# Patient Record
Sex: Male | Born: 1960 | Race: Black or African American | Hispanic: No
Health system: Southern US, Academic
[De-identification: ages and names within clinical notes are randomized; demographics above are authoritative.]

## PROBLEM LIST (undated history)

## (undated) ENCOUNTER — Ambulatory Visit: Payer: BLUE CROSS/BLUE SHIELD

## (undated) ENCOUNTER — Ambulatory Visit

## (undated) ENCOUNTER — Encounter

## (undated) ENCOUNTER — Telehealth

## (undated) ENCOUNTER — Telehealth: Attending: Urology | Primary: Urology

## (undated) ENCOUNTER — Ambulatory Visit
Payer: Medicare (Managed Care) | Attending: Student in an Organized Health Care Education/Training Program | Primary: Student in an Organized Health Care Education/Training Program

## (undated) ENCOUNTER — Encounter
Attending: Student in an Organized Health Care Education/Training Program | Primary: Student in an Organized Health Care Education/Training Program

## (undated) ENCOUNTER — Ambulatory Visit: Payer: Medicaid (Managed Care)

## (undated) ENCOUNTER — Ambulatory Visit: Payer: BLUE CROSS/BLUE SHIELD | Attending: Urology | Primary: Urology

## (undated) ENCOUNTER — Encounter: Attending: Urology | Primary: Urology

## (undated) ENCOUNTER — Encounter: Attending: Dermatology | Primary: Dermatology

## (undated) ENCOUNTER — Inpatient Hospital Stay

## (undated) DIAGNOSIS — K219 Gastro-esophageal reflux disease without esophagitis: Secondary | ICD-10-CM

## (undated) DIAGNOSIS — D689 Coagulation defect, unspecified: Secondary | ICD-10-CM

## (undated) DIAGNOSIS — J45909 Unspecified asthma, uncomplicated: Secondary | ICD-10-CM

## (undated) DIAGNOSIS — Z21 Asymptomatic human immunodeficiency virus [HIV] infection status: Secondary | ICD-10-CM

## (undated) DIAGNOSIS — Z5189 Encounter for other specified aftercare: Secondary | ICD-10-CM

## (undated) DIAGNOSIS — M1990 Unspecified osteoarthritis, unspecified site: Secondary | ICD-10-CM

## (undated) DIAGNOSIS — I10 Essential (primary) hypertension: Secondary | ICD-10-CM

## (undated) DIAGNOSIS — B20 Human immunodeficiency virus [HIV] disease: Secondary | ICD-10-CM

## (undated) DIAGNOSIS — I499 Cardiac arrhythmia, unspecified: Secondary | ICD-10-CM

## (undated) DIAGNOSIS — R0600 Dyspnea, unspecified: Secondary | ICD-10-CM

## (undated) DIAGNOSIS — I82409 Acute embolism and thrombosis of unspecified deep veins of unspecified lower extremity: Secondary | ICD-10-CM

## (undated) DIAGNOSIS — K8020 Calculus of gallbladder without cholecystitis without obstruction: Secondary | ICD-10-CM

## (undated) DIAGNOSIS — L0291 Cutaneous abscess, unspecified: Secondary | ICD-10-CM

## (undated) DIAGNOSIS — M87052 Idiopathic aseptic necrosis of left femur: Secondary | ICD-10-CM

## (undated) DIAGNOSIS — N183 Chronic kidney disease, stage 3 unspecified: Secondary | ICD-10-CM

## (undated) DIAGNOSIS — K5790 Diverticulosis of intestine, part unspecified, without perforation or abscess without bleeding: Secondary | ICD-10-CM

## (undated) HISTORY — DX: Chronic kidney disease, stage 3 (moderate): N18.3

## (undated) HISTORY — DX: Unspecified asthma, uncomplicated: J45.909

## (undated) HISTORY — PX: JOINT REPLACEMENT: SHX530

## (undated) HISTORY — DX: Chronic kidney disease, stage 3 unspecified: N18.30

## (undated) HISTORY — DX: Diverticulosis of intestine, part unspecified, without perforation or abscess without bleeding: K57.90

## (undated) HISTORY — DX: Essential (primary) hypertension: I10

## (undated) HISTORY — DX: Idiopathic aseptic necrosis of left femur: M87.052

## (undated) HISTORY — DX: Coagulation defect, unspecified: D68.9

## (undated) HISTORY — PX: DENTAL SURGERY: SHX609

## (undated) HISTORY — DX: Morbid (severe) obesity due to excess calories: E66.01

## (undated) HISTORY — DX: Calculus of gallbladder without cholecystitis without obstruction: K80.20

## (undated) HISTORY — DX: Encounter for other specified aftercare: Z51.89

---

## 2015-12-14 ENCOUNTER — Encounter (HOSPITAL_COMMUNITY): Payer: Self-pay | Admitting: *Deleted

## 2015-12-14 ENCOUNTER — Emergency Department (HOSPITAL_COMMUNITY)
Admission: EM | Admit: 2015-12-14 | Discharge: 2015-12-14 | Disposition: A | Discharge status: Home / Self Care | Payer: Medicaid - Out of State | Attending: Emergency Medicine | Admitting: Emergency Medicine

## 2015-12-14 ENCOUNTER — Emergency Department (HOSPITAL_COMMUNITY): Payer: Medicaid - Out of State

## 2015-12-14 DIAGNOSIS — R0602 Shortness of breath: Secondary | ICD-10-CM | POA: Insufficient documentation

## 2015-12-14 DIAGNOSIS — N492 Inflammatory disorders of scrotum: Secondary | ICD-10-CM | POA: Insufficient documentation

## 2015-12-14 DIAGNOSIS — L0291 Cutaneous abscess, unspecified: Secondary | ICD-10-CM

## 2015-12-14 DIAGNOSIS — N453 Epididymo-orchitis: Secondary | ICD-10-CM

## 2015-12-14 HISTORY — DX: Acute embolism and thrombosis of unspecified deep veins of unspecified lower extremity: I82.409

## 2015-12-14 HISTORY — DX: Asymptomatic human immunodeficiency virus (hiv) infection status: Z21

## 2015-12-14 HISTORY — DX: Cutaneous abscess, unspecified: L02.91

## 2015-12-14 HISTORY — DX: Human immunodeficiency virus (HIV) disease: B20

## 2015-12-14 LAB — CBC WITH DIFFERENTIAL/PLATELET
BASOS ABS: 0 10*3/uL (ref 0.0–0.1)
Basophils Relative: 0 %
EOS PCT: 2 %
Eosinophils Absolute: 0.2 10*3/uL (ref 0.0–0.7)
HCT: 37.4 % — ABNORMAL LOW (ref 39.0–52.0)
Hemoglobin: 11.9 g/dL — ABNORMAL LOW (ref 13.0–17.0)
LYMPHS ABS: 1.5 10*3/uL (ref 0.7–4.0)
LYMPHS PCT: 17 %
MCH: 33.1 pg (ref 26.0–34.0)
MCHC: 31.8 g/dL (ref 30.0–36.0)
MCV: 104.2 fL — AB (ref 78.0–100.0)
MONO ABS: 0.6 10*3/uL (ref 0.1–1.0)
MONOS PCT: 6 %
Neutro Abs: 6.8 10*3/uL (ref 1.7–7.7)
Neutrophils Relative %: 75 %
PLATELETS: 279 10*3/uL (ref 150–400)
RBC: 3.59 MIL/uL — ABNORMAL LOW (ref 4.22–5.81)
RDW: 14 % (ref 11.5–15.5)
WBC: 9.1 10*3/uL (ref 4.0–10.5)

## 2015-12-14 LAB — COMPREHENSIVE METABOLIC PANEL
ALBUMIN: 3 g/dL — AB (ref 3.5–5.0)
ALT: 11 U/L — ABNORMAL LOW (ref 17–63)
AST: 15 U/L (ref 15–41)
Alkaline Phosphatase: 62 U/L (ref 38–126)
Anion gap: 6 (ref 5–15)
BILIRUBIN TOTAL: 0.5 mg/dL (ref 0.3–1.2)
BUN: 11 mg/dL (ref 6–20)
CO2: 24 mmol/L (ref 22–32)
Calcium: 8.9 mg/dL (ref 8.9–10.3)
Chloride: 108 mmol/L (ref 101–111)
Creatinine, Ser: 1.7 mg/dL — ABNORMAL HIGH (ref 0.61–1.24)
GFR calc Af Amer: 50 mL/min — ABNORMAL LOW (ref >60)
GFR calc non Af Amer: 43 mL/min — ABNORMAL LOW (ref >60)
GLUCOSE: 112 mg/dL — AB (ref 65–99)
POTASSIUM: 4.9 mmol/L (ref 3.5–5.1)
Sodium: 138 mmol/L (ref 135–145)
TOTAL PROTEIN: 7.1 g/dL (ref 6.5–8.1)

## 2015-12-14 LAB — BRAIN NATRIURETIC PEPTIDE: B Natriuretic Peptide: 125.9 pg/mL — ABNORMAL HIGH (ref 0.0–100.0)

## 2015-12-14 IMAGING — DX DG CHEST 2V
2 series · 2 of 2 positions shown · non-contrast
Comparison: None.

CLINICAL DATA: Weakness, fatigue, shortness of breath with exertion
for the past 2 weeks. Bilateral lower extremity swelling for the
past 2 weeks.

EXAM:
CHEST  2 VIEW

[chest pa]
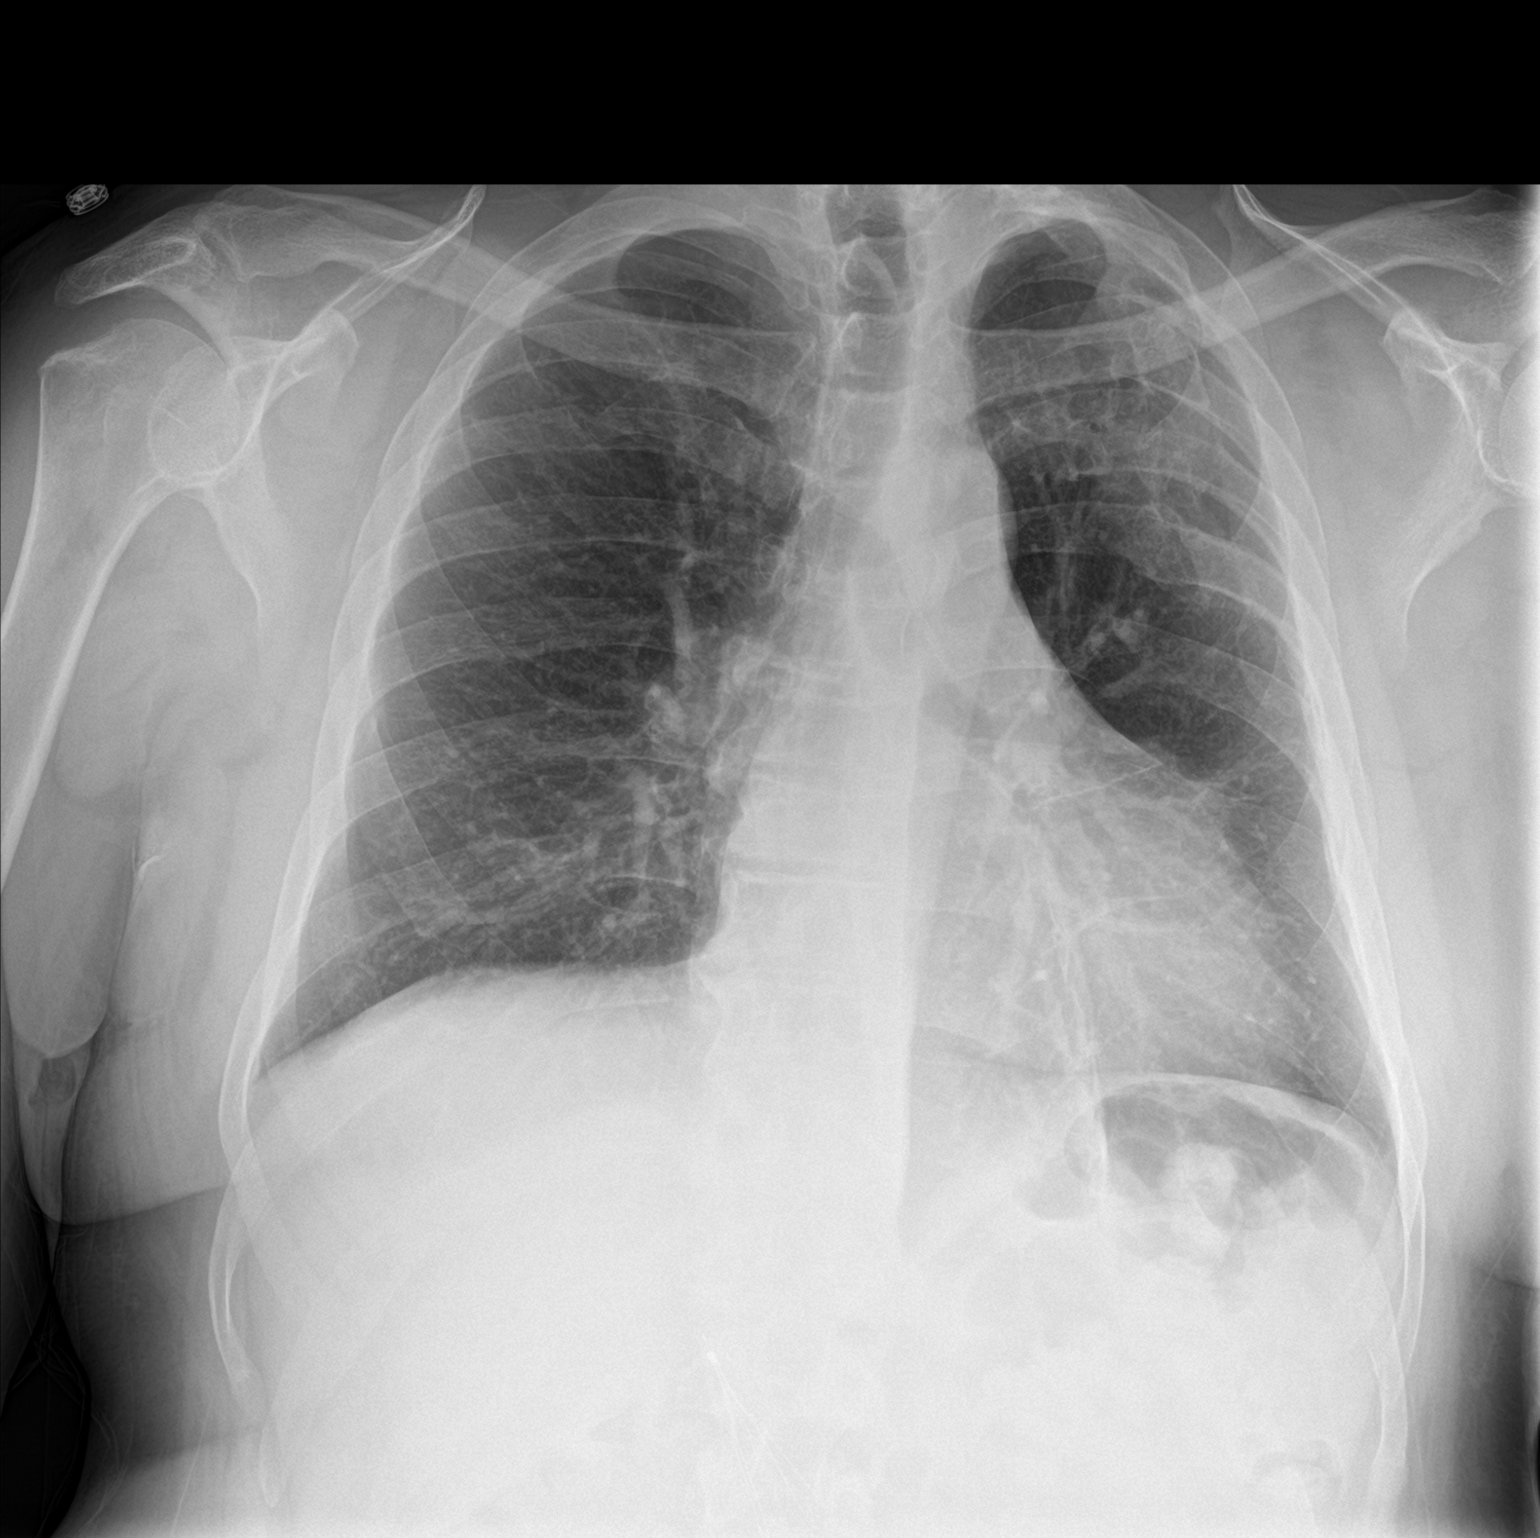

[chest lat]
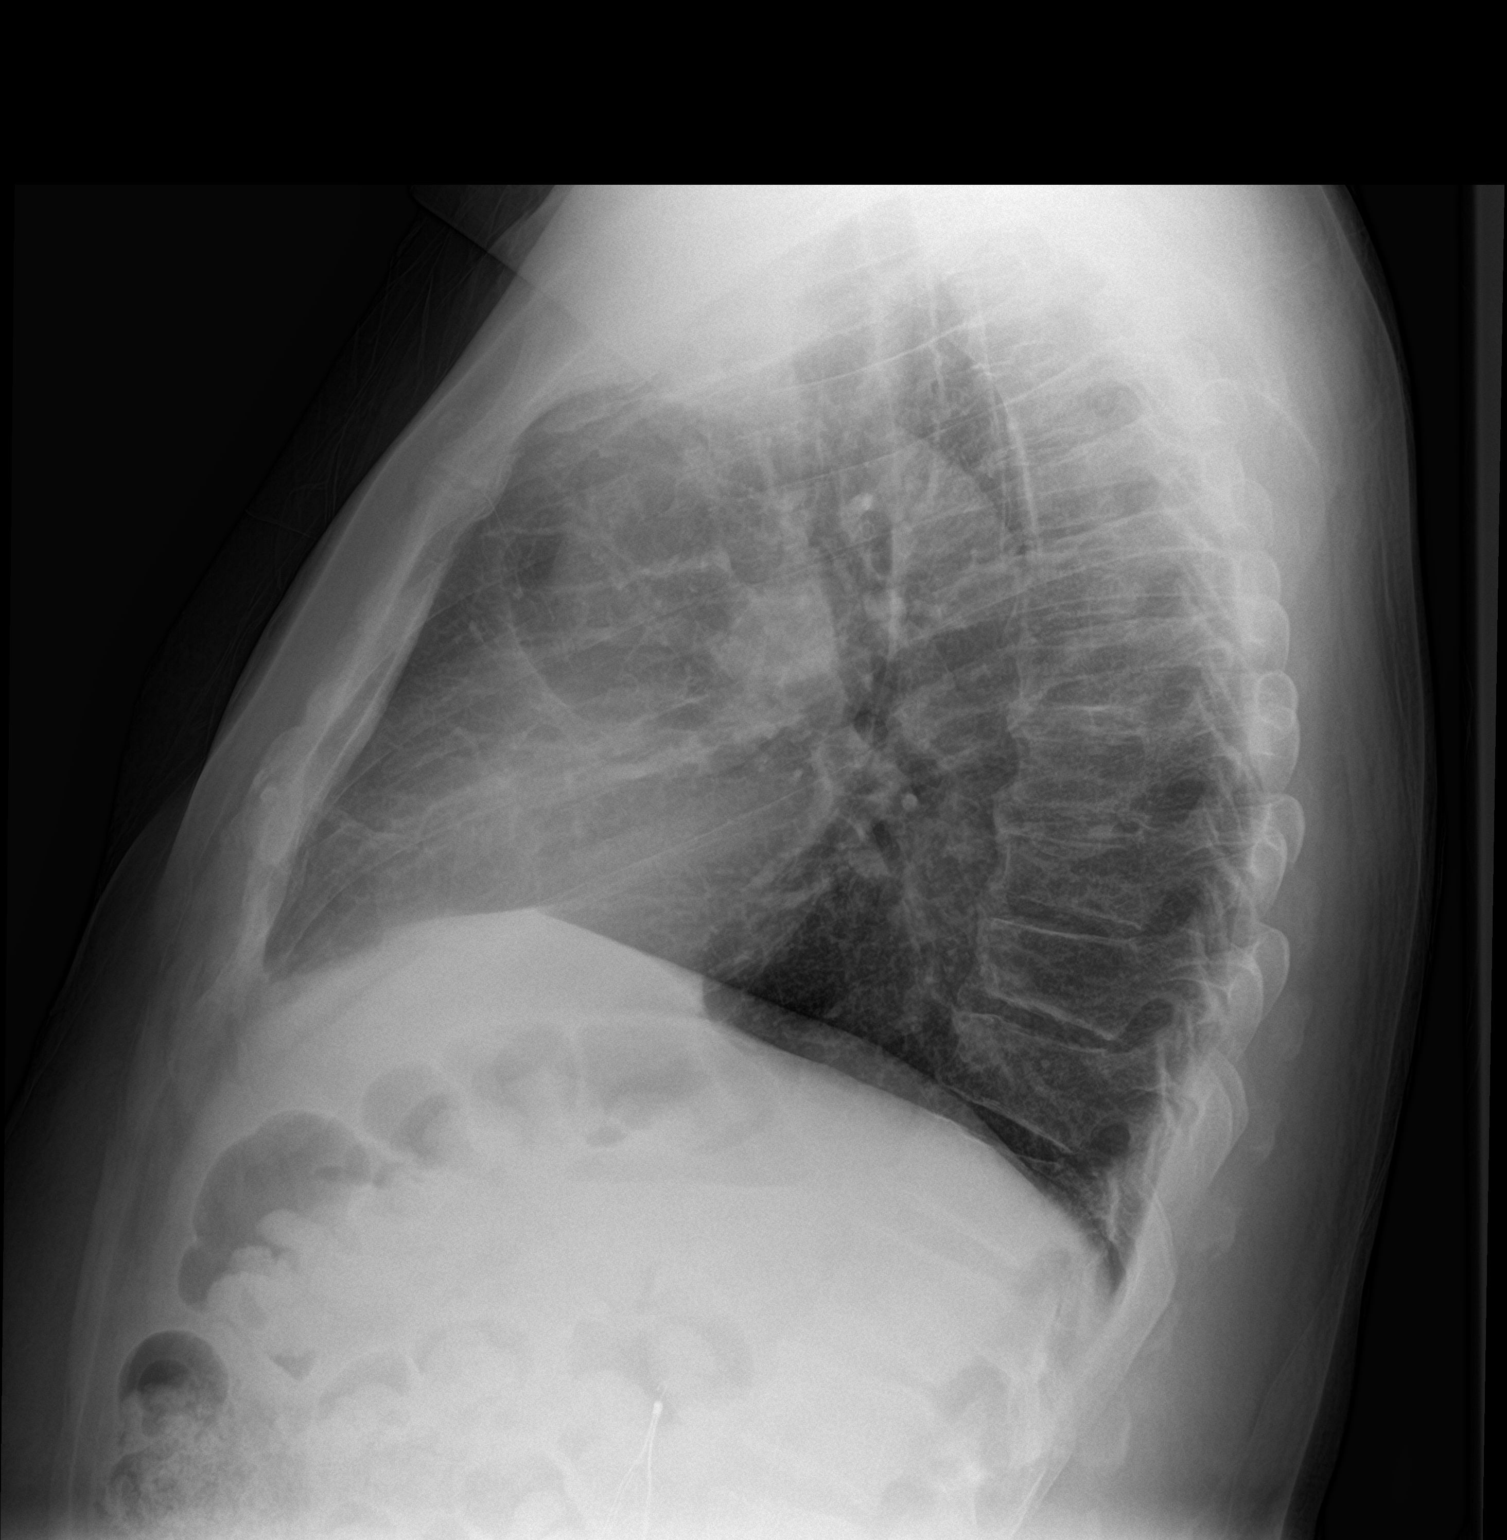

[2 of 2 positions shown; findings below may reference images not displayed]

FINDINGS: Normal sized heart. Clear lungs with normal vascularity. Small
amount of linear density at the left lung base. Thoracic spine
degenerative changes and mild scoliosis. Inferior vena cava filter.
IMPRESSION: Small amount of left basilar linear atelectasis or scarring. No
acute abnormality.

## 2015-12-14 MED ORDER — HYDROCODONE-ACETAMINOPHEN 5-325 MG PO TABS: 1.0000 | ORAL_TABLET | Freq: Four times a day (QID) | ORAL | 0 refills | Status: DC | PRN

## 2015-12-14 MED ORDER — CLINDAMYCIN PHOSPHATE 600 MG/50ML IV SOLN
600.0000 mg | Freq: Once | INTRAVENOUS | Status: AC
  Administered 2015-12-14: 600 mg via INTRAVENOUS
  Filled 2015-12-14: qty 50

## 2015-12-14 MED ORDER — SODIUM CHLORIDE 0.9 % IV BOLUS (SEPSIS)
1000.0000 mL | Freq: Once | INTRAVENOUS | Status: AC
  Administered 2015-12-14: 1000 mL via INTRAVENOUS

## 2015-12-14 MED ORDER — LIDOCAINE-EPINEPHRINE 1 %-1:100000 IJ SOLN
10.0000 mL | Freq: Once | INTRAMUSCULAR | Status: AC
  Administered 2015-12-14: 10 mL
  Filled 2015-12-14: qty 1

## 2015-12-14 MED ORDER — DOXYCYCLINE HYCLATE 100 MG PO CAPS: 100.0000 mg | ORAL_CAPSULE | Freq: Two times a day (BID) | ORAL | 0 refills | Status: DC

## 2015-12-14 NOTE — ED Notes (Signed)
PLACED PATIENT INTO A GOWN

## 2015-12-14 NOTE — ED Notes (Signed)
Patient transported to Ultrasound 

## 2015-12-14 NOTE — ED Provider Notes (Signed)
[facility] DEPT Provider Note   CSN: [id number] Arrival date & time: 12/14/15  1041     History   Chief Complaint Chief Complaint  Patient presents with  . Abscess    HPI Allen Espinoza is a 55 y.o. male.  55yo M w/ PMH including HIV on HAART therapy, DVT on Xarelto who presents with abscess. The patient just moved here from [place]. He reports that 6-7 months ago he had an abscess on his right scrotum that required drainage and hospitalization. It got better and he has not had any problems until approximately one week ago. He began noticing an area of swelling on his left inner thigh concerning for abscess. He then developed swelling and pain in his left groin and he has noticed drainage from his scrotum. No urinary symptoms, abdominal pain, vomiting, fevers, or new night sweats. He reports that his last viral load was undetectable but he cannot recall his last CD4 count. He states that he is compliant with his HIV and anticoagulant medication.   Patient also notes 2 weeks of fatigue/generalized weakness with exertion. He denies any significant chest pain or shortness of breath.   The history is provided by the patient.    Past Medical History:  Diagnosis Date  . Abscess   . DVT (deep venous thrombosis) ([place])   . HIV (human immunodeficiency virus infection) ([place])     There are no active problems to display for this patient.   History reviewed. No pertinent surgical history.     Home Medications    Prior to Admission medications   Medication Sig Start Date End Date Taking? Authorizing Provider  doxycycline (VIBRAMYCIN) 100 MG capsule Take 1 capsule (100 mg total) by mouth 2 (two) times daily. 12/14/15   Sharlett Iles, MD    Family History No family history on file.  Social History Social History  Substance Use Topics  . Smoking status: Never Smoker  . Smokeless tobacco: Never Used  . Alcohol use No     Allergies   Sulfa  antibiotics   Review of Systems Review of Systems 10 Systems reviewed and are negative for acute change except as noted in the HPI.   Physical Exam Updated Vital Signs BP 131/73   Pulse (!) 58   Temp 98.1 F (36.7 C) (Oral)   Resp 18   Ht 6' (1.829 m)   Wt 269 lb (122 kg)   SpO2 99%   BMI 36.48 kg/m   Physical Exam  Constitutional: He is oriented to person, place, and time. He appears well-developed and well-nourished. No distress.  HENT:  Head: Normocephalic and atraumatic.  Moist mucous membranes  Eyes: Conjunctivae are normal. Pupils are equal, round, and reactive to light.  Neck: Neck supple.  Cardiovascular: Normal rate, regular rhythm and normal heart sounds.   No murmur heard. Pulmonary/Chest: Effort normal and breath sounds normal.  Abdominal: Soft. Bowel sounds are normal. He exhibits no distension. There is no tenderness.  Genitourinary: Penis normal.  Genitourinary Comments: Tenderness of L scrotum with multiple sites draining foul-smelling bloody purulent fluid; 2cm area of fluctuance and tenderness on proximal L medial thigh near groin  Musculoskeletal: He exhibits edema (2+ pitting edema BLE).  Neurological: He is alert and oriented to person, place, and time.  Fluent speech  Skin: Skin is warm and dry.  Psychiatric: He has a normal mood and affect. Judgment normal.  Nursing note and vitals reviewed. Chaperone was present during exam.    ED  Treatments / Results  Labs (all labs ordered are listed, but only abnormal results are displayed) Labs Reviewed  COMPREHENSIVE METABOLIC PANEL - Abnormal; Notable for the following:       Result Value   Glucose, Bld 112 (*)    Creatinine, Ser 1.70 (*)    Albumin 3.0 (*)    ALT 11 (*)    GFR calc non Af Amer 43 (*)    GFR calc Af Amer 50 (*)    All other components within normal limits  CBC WITH DIFFERENTIAL/PLATELET - Abnormal; Notable for the following:    RBC 3.59 (*)    Hemoglobin 11.9 (*)    HCT 37.4  (*)    MCV 104.2 (*)    All other components within normal limits  BRAIN NATRIURETIC PEPTIDE - Abnormal; Notable for the following:    B Natriuretic Peptide 125.9 (*)    All other components within normal limits    EKG  EKG Interpretation  Date/Time:  Thursday December 14 2015 13:53:26 EDT Ventricular Rate:  57 PR Interval:    QRS Duration: 88 QT Interval:  411 QTC Calculation: 401 R Axis:   29 Text Interpretation:  Sinus rhythm No previous ECGs available Confirmed by Vibha Ferdig MD, Annalina Needles [phone]) on 12/14/2015 2:41:05 PM       Radiology Dg Chest 2 View  Result Date: 12/14/2015 CLINICAL DATA:  Weakness, fatigue, shortness of breath with exertion for the past 2 weeks. Bilateral lower extremity swelling for the past 2 weeks. EXAM: CHEST  2 VIEW COMPARISON:  None. FINDINGS: Normal sized heart. Clear lungs with normal vascularity. Small amount of linear density at the left lung base. Thoracic spine degenerative changes and mild scoliosis. Inferior vena cava filter. IMPRESSION: Small amount of left basilar linear atelectasis or scarring. No acute abnormality. Electronically Signed   By: Claudie Revering M.D.   On: 12/14/2015 13:50   US Scrotum  Result Date: 12/14/2015 CLINICAL DATA:  Drainage from a left thigh and scrotal abscess. EXAM: ULTRASOUND OF SCROTUM TECHNIQUE: Complete ultrasound examination of the testicles, epididymis, and other scrotal structures was performed. COMPARISON:  None. FINDINGS: Right testicle Measurements: 4.6 x 3.0 x 2.2 cm. Mildly diffusely heterogeneous. No discrete mass seen. Left testicle Measurements: 4.5 x 2.4 x 2.4 cm. Moderately diffusely heterogeneous with curvilinear hypoechoic bands. No discrete mass. Right epididymis: Heterogeneous, including hypoechoic regions and diffusely increased vascularity with color Doppler. Left epididymis: Heterogeneous, including hypoechoic regions and diffusely increased vascularity with color Doppler. Hydrocele:  Small to  moderate-sized left hydrocele. Varicocele:  None visualized. Other: Diffuse scrotal skin thickening, greater on the left. No discrete fluid collections seen. IMPRESSION: 1. Findings compatible with bilateral epididymo-orchitis, greater on the left. 2. Small moderate-sized left hydrocele. 3. Diffuse scrotal skin thickening compatible with cellulitis. 4. No abscess visualized. Electronically Signed   By: Claudie Revering M.D.   On: 12/14/2015 13:33    Procedures .Marland KitchenIncision and Drainage Date/Time: 12/14/2015 3:30 PM Performed by: Sharlett Iles Authorized by: Sharlett Iles   Consent:    Consent obtained:  Verbal   Consent given by:  Patient Location:    Type:  Abscess   Location:  Lower extremity   Lower extremity location:  Leg   Leg location:  L upper leg Pre-procedure details:    Skin preparation:  Betadine Anesthesia (see MAR for exact dosages):    Anesthesia method:  Local infiltration   Local anesthetic:  Lidocaine 1% WITH epi Procedure type:    Complexity:  Simple Procedure  details:    Incision types:  Single straight   Incision depth:  Dermal   Scalpel blade:  11   Drainage:  Bloody   Drainage amount:  Scant   Wound treatment:  Wound left open   Packing materials:  None Post-procedure details:    Patient tolerance of procedure:  Tolerated well, no immediate complications   (including critical care time)  Medications Ordered in ED Medications  clindamycin (CLEOCIN) IVPB 600 mg (600 mg Intravenous New Bag/Given 12/14/15 1516)  sodium chloride 0.9 % bolus 1,000 mL (1,000 mLs Intravenous New Bag/Given 12/14/15 1516)  lidocaine-EPINEPHrine (XYLOCAINE W/EPI) 1 %-1:100000 (with pres) injection 10 mL (10 mLs Other Given 12/14/15 1516)     Initial Impression / Assessment and Plan / ED Course  I have reviewed the triage vital signs and the nursing notes.  Pertinent labs & imaging results that were available during my care of the patient were reviewed by me and  considered in my medical decision making (see chart for details).  Clinical Course   Pt w/ HIV and DVT on medications p/w 1 week of thigh and scrotal swelling and pain, drainage. Hx abscess R scrotum. He was nontoxic on exam with normal vital signs. He had multiple areas of purulent drainage on left scrotum and an area of fluctuance suggestive of abscess on left inner thigh. Obtained blood work because of his comorbidities and scrotum ultrasound to evaluate for large fluid collection. Also obtained chest x-ray and BNP given his fatigue with exertion recently.  Labs show creatinine 1.7, hemoglobin 11.9, no baseline labs available for comparison. Multiple WBC count. Patient's BUN is 11 and he has no reason to be dehydrated therefore I suspect chronic kidney disease. BNP is 125 and chest x-ray without evidence of volume overload.  Scrotal ultrasound shows bilateral epididymoorchitis. Gave the patient a dose of clindamycin and discussed with urology, Dr. Alyson Ingles. Per his recommendations, I will give the patient a 3 week course of doxycycline based on allergy profile. Instructed to follow-up in the clinic within one week. I extensively reviewed return precautions including any worsening scrotal swelling or symptoms. Patient voiced understanding. Because of the patient's HIV without any current PCP or infectious disease specialist here, I contacted case management to assist with outpatient care. Patient discharged in satisfactory condition.  Final Clinical Impressions(s) / ED Diagnoses   Final diagnoses:  Epididymo-orchitis  Abscess    New Prescriptions New Prescriptions   DOXYCYCLINE (VIBRAMYCIN) 100 MG CAPSULE    Take 1 capsule (100 mg total) by mouth 2 (two) times daily.     Sharlett Iles, MD 12/14/15 [phone]

## 2015-12-14 NOTE — Discharge Planning (Signed)
[facility] consulted to assist with pt follow up at ID clinic.  EDCM spoke with pt at bedside under HIPAA regulations for permission to contact ID clinic on his behalf. EDCM called Orland Mustard, RN (ID RN) to follow up with pt appointment. Nimra Puccinelli J. Clydene Laming, [place], [place], [place]

## 2015-12-14 NOTE — ED Notes (Signed)
Patient transported to X-ray 

## 2015-12-14 NOTE — ED Triage Notes (Signed)
Pt states abscess to L inner thigh and L scrotum.  Hx of HIV.

## 2015-12-14 NOTE — Care Management Note (Signed)
Case Management Note  Patient Details  Name: ABELL SALZBERG MRN: [id number] Date of Birth: 13-May-1960  Subjective/Objective:       Patient presented to [facility]  C/o of scrotal pain, noted scrotal  abscess              Action/Plan: CM consulted concerning medication assistance, Patient has out of state Medicaid. Patient being discharged on doxycycline and states he could not afford the cost.  Discuss sed MATCH p[rogram and guidelines, patient is agreeable to terms. Enrolled in program, [facility] letter printed and given to patient with instructions and list of participating p[harmacies.patient verbalized understanding teach back done. No further questions or concerns verbalized. No additional ED CM needs identified.  Expected Discharge Date:      12/14/2015            Expected Discharge Plan:  Home/Self Care  In-House Referral:     Discharge planning Services  CM Consult, [place] [facility]  Post Acute Care Choice:  NA Choice offered to:  Patient  DME Arranged:    DME Agency:     HH Arranged:    [place] Agency:     Status of Service:  Completed, signed off  If discussed at [employer], dates discussed:    Additional CommentsLaurena Slimmer, RN 12/14/2015, 5:34 PM

## 2015-12-24 IMAGING — US US SCROTUM
1 series · 14 of 25 positions shown · non-contrast
Comparison: None.

CLINICAL DATA: Drainage from a left thigh and scrotal abscess.

EXAM:
ULTRASOUND OF SCROTUM
TECHNIQUE: Complete ultrasound examination of the testicles, epididymis, and
other scrotal structures was performed.

[Series 1: us scrotum · 0.06mm/px · 14 of 91 slices shown]
[im 1/91]
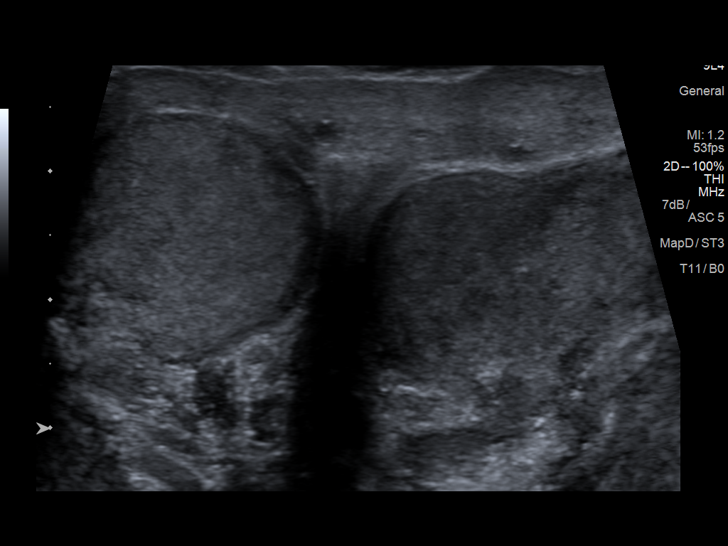
[im 8/91]
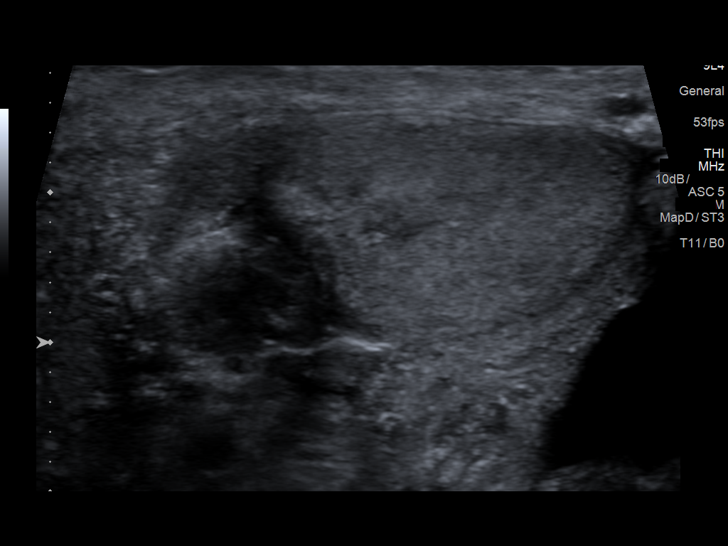
[im 16/91]
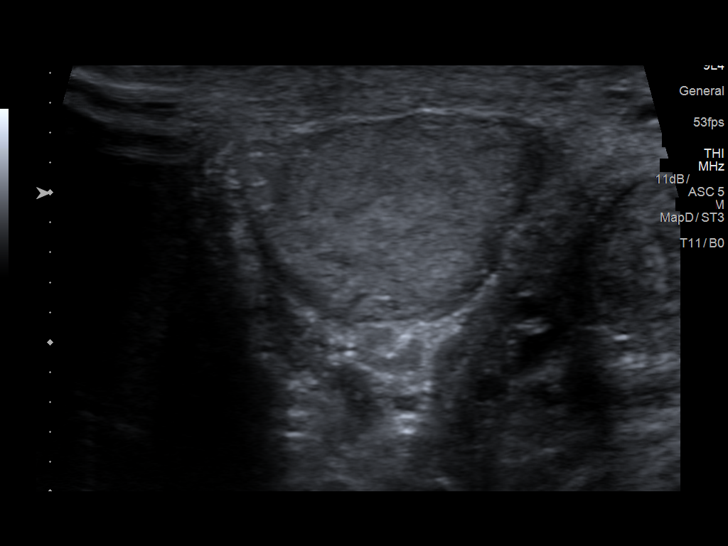
[im 23/91]
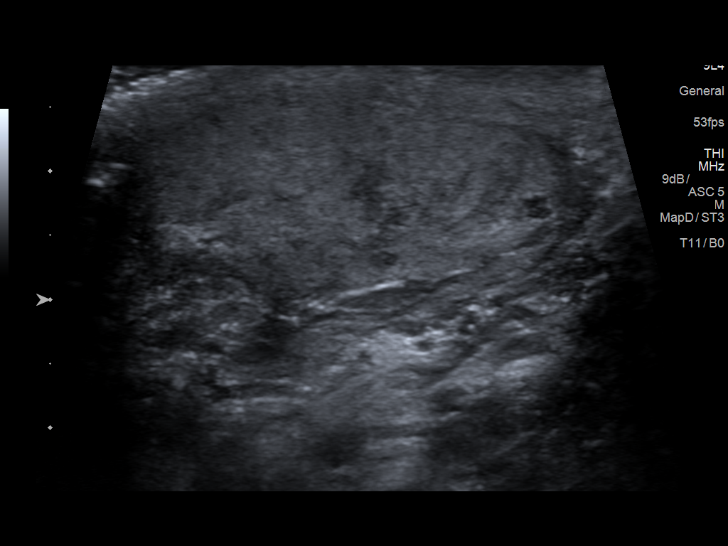
[im 31/91]
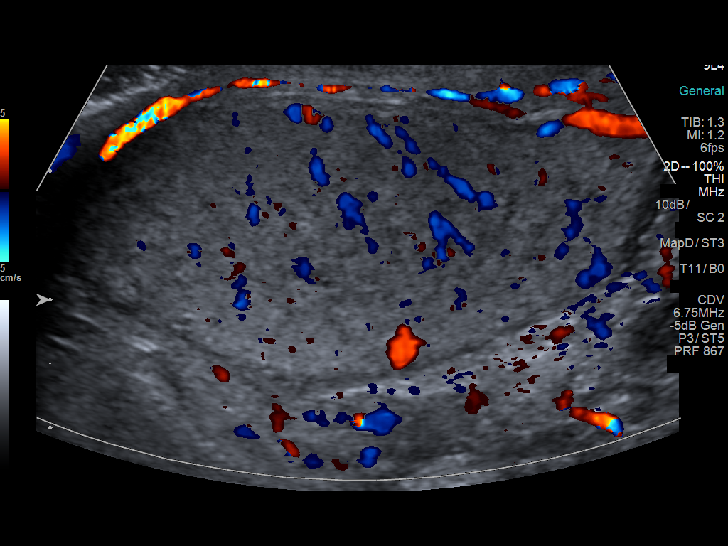
[im 34/91]
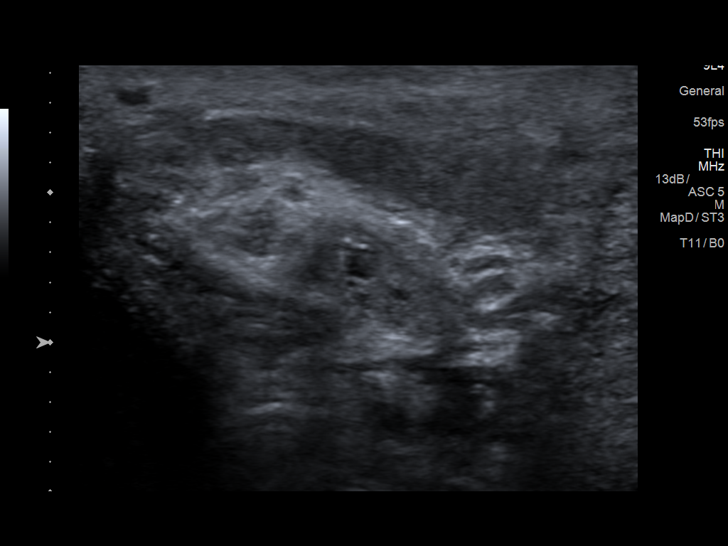
[im 42/91]
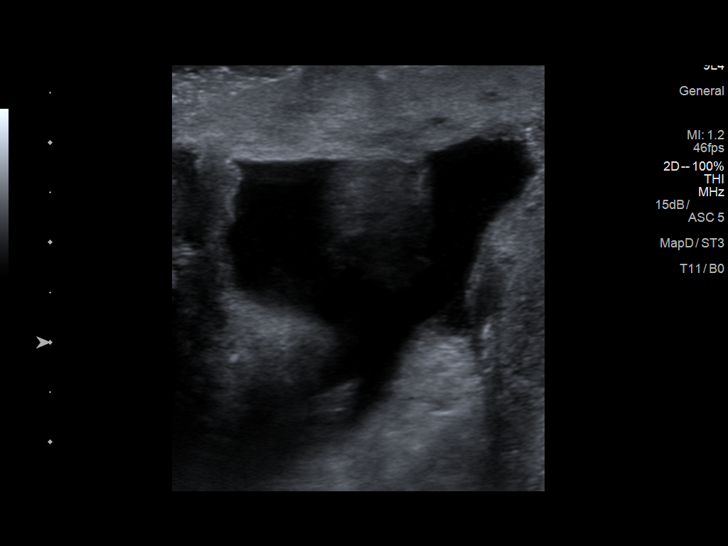
[im 49/91]
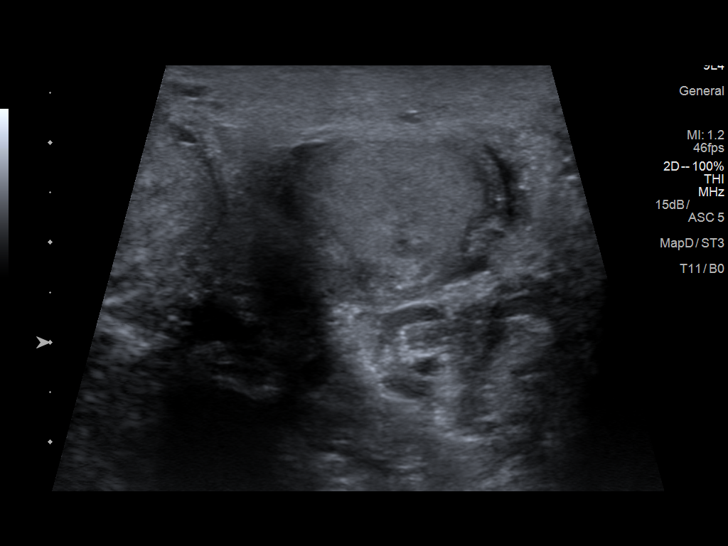
[im 57/91]
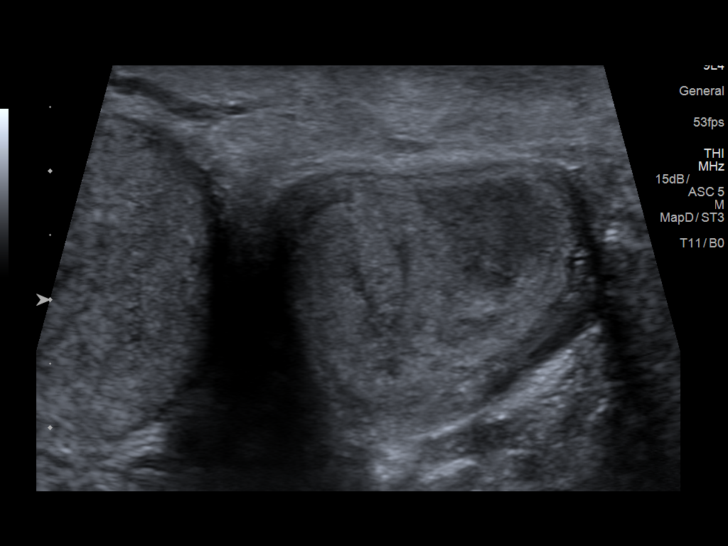
[im 61/91]
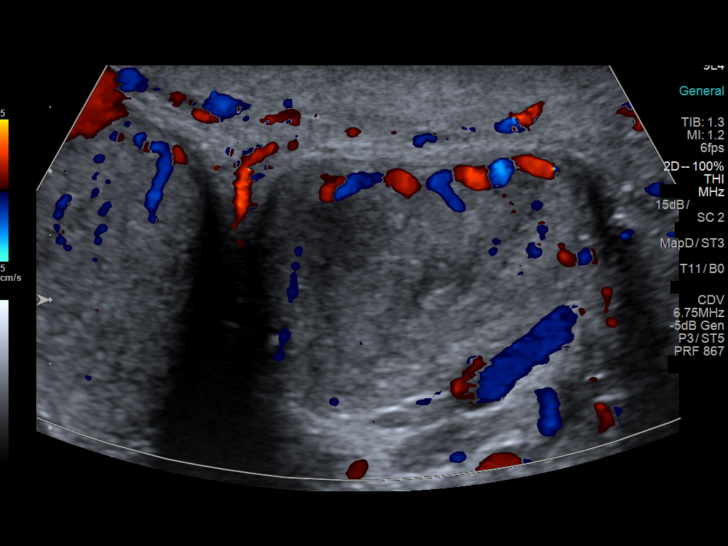
[im 68/91]
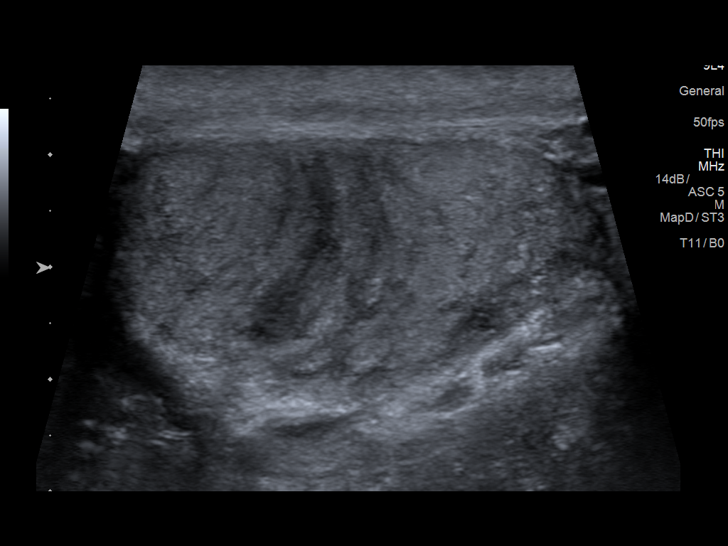
[im 76/91]
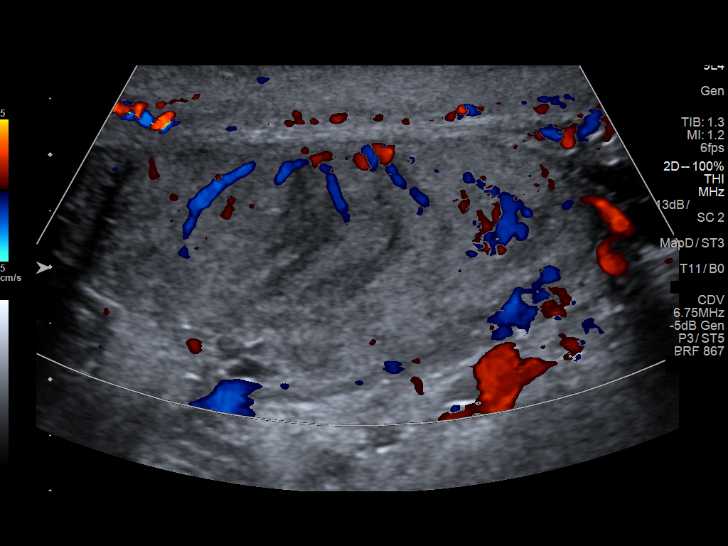
[im 83/91]
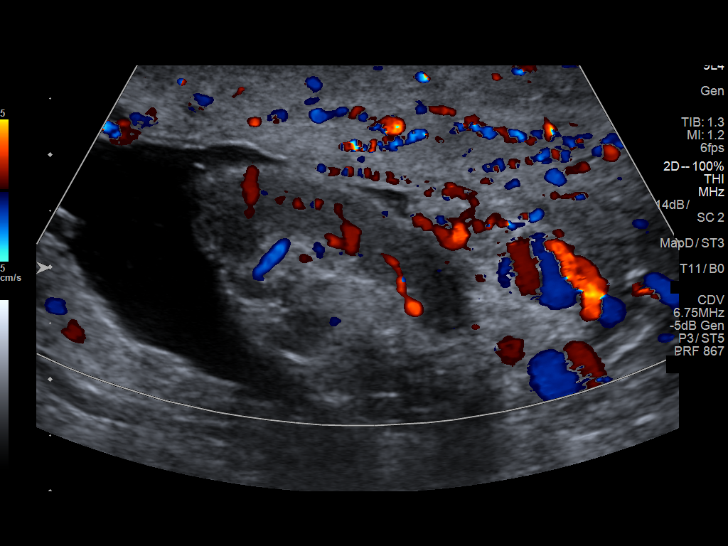
[im 91/91]
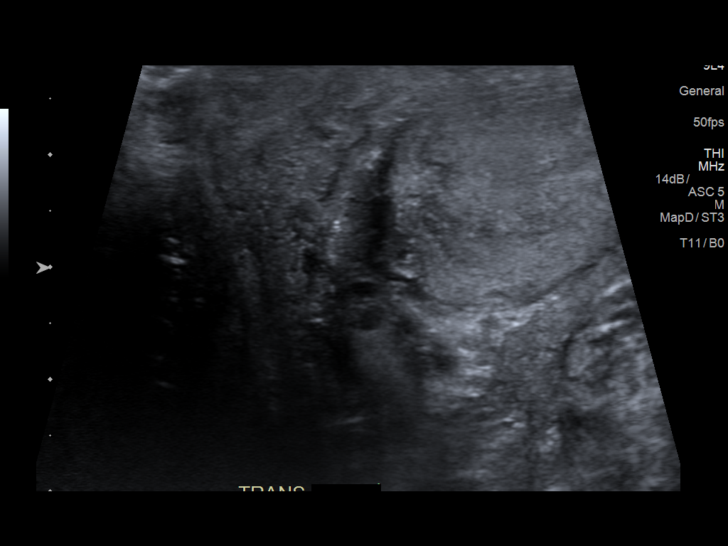

[14 of 25 positions shown; findings below may reference images not displayed]

FINDINGS: Right testicle

Measurements: 4.6 x 3.0 x 2.2 cm. Mildly diffusely heterogeneous. No
discrete mass seen.

Left testicle

Measurements: 4.5 x 2.4 x 2.4 cm. Moderately diffusely heterogeneous
with curvilinear hypoechoic bands. No discrete mass.

Right epididymis: Heterogeneous, including hypoechoic regions and
diffusely increased vascularity with color Doppler.

Left epididymis: Heterogeneous, including hypoechoic regions and
diffusely increased vascularity with color Doppler.

Hydrocele:  Small to moderate-sized left hydrocele.

Varicocele:  None visualized.

Other: Diffuse scrotal skin thickening, greater on the left. No
discrete fluid collections seen.
IMPRESSION: 1. Findings compatible with bilateral epididymo-orchitis, greater on
the left.
2. Small moderate-sized left hydrocele.
3. Diffuse scrotal skin thickening compatible with cellulitis.
4. No abscess visualized.

## 2016-01-29 ENCOUNTER — Emergency Department (HOSPITAL_COMMUNITY): Payer: Medicaid - Out of State

## 2016-01-29 ENCOUNTER — Encounter (HOSPITAL_COMMUNITY): Payer: Self-pay | Admitting: Emergency Medicine

## 2016-01-29 ENCOUNTER — Emergency Department (HOSPITAL_COMMUNITY)
Admission: EM | Admit: 2016-01-29 | Discharge: 2016-01-29 | Disposition: A | Discharge status: Home / Self Care | Payer: Medicaid - Out of State | Attending: Emergency Medicine | Admitting: Emergency Medicine

## 2016-01-29 DIAGNOSIS — Z21 Asymptomatic human immunodeficiency virus [HIV] infection status: Secondary | ICD-10-CM | POA: Diagnosis not present

## 2016-01-29 DIAGNOSIS — N492 Inflammatory disorders of scrotum: Secondary | ICD-10-CM | POA: Diagnosis not present

## 2016-01-29 DIAGNOSIS — Z79899 Other long term (current) drug therapy: Secondary | ICD-10-CM | POA: Diagnosis not present

## 2016-01-29 DIAGNOSIS — R1909 Other intra-abdominal and pelvic swelling, mass and lump: Secondary | ICD-10-CM | POA: Diagnosis present

## 2016-01-29 DIAGNOSIS — Z7982 Long term (current) use of aspirin: Secondary | ICD-10-CM | POA: Diagnosis not present

## 2016-01-29 LAB — BASIC METABOLIC PANEL
Anion gap: 8 (ref 5–15)
BUN: 16 mg/dL (ref 6–20)
CHLORIDE: 104 mmol/L (ref 101–111)
CO2: 24 mmol/L (ref 22–32)
CREATININE: 1.71 mg/dL — AB (ref 0.61–1.24)
Calcium: 8.9 mg/dL (ref 8.9–10.3)
GFR calc non Af Amer: 43 mL/min — ABNORMAL LOW (ref >60)
GFR, EST AFRICAN AMERICAN: 49 mL/min — AB (ref >60)
GLUCOSE: 101 mg/dL — AB (ref 65–99)
Potassium: 4.5 mmol/L (ref 3.5–5.1)
Sodium: 136 mmol/L (ref 135–145)

## 2016-01-29 LAB — I-STAT CHEM 8, ED
BUN: 15 mg/dL (ref 6–20)
CHLORIDE: 103 mmol/L (ref 101–111)
CREATININE: 1.8 mg/dL — AB (ref 0.61–1.24)
Calcium, Ion: 1.13 mmol/L — ABNORMAL LOW (ref 1.15–1.40)
Glucose, Bld: 100 mg/dL — ABNORMAL HIGH (ref 65–99)
HEMATOCRIT: 40 % (ref 39.0–52.0)
Hemoglobin: 13.6 g/dL (ref 13.0–17.0)
POTASSIUM: 4.5 mmol/L (ref 3.5–5.1)
SODIUM: 138 mmol/L (ref 135–145)
TCO2: 25 mmol/L (ref 0–100)

## 2016-01-29 LAB — CBC WITH DIFFERENTIAL/PLATELET
BASOS PCT: 0 %
Basophils Absolute: 0 10*3/uL (ref 0.0–0.1)
Eosinophils Absolute: 0.1 10*3/uL (ref 0.0–0.7)
Eosinophils Relative: 1 %
HEMATOCRIT: 37.2 % — AB (ref 39.0–52.0)
HEMOGLOBIN: 12.7 g/dL — AB (ref 13.0–17.0)
LYMPHS ABS: 1.2 10*3/uL (ref 0.7–4.0)
LYMPHS PCT: 15 %
MCH: 33.4 pg (ref 26.0–34.0)
MCHC: 34.1 g/dL (ref 30.0–36.0)
MCV: 97.9 fL (ref 78.0–100.0)
MONO ABS: 0.6 10*3/uL (ref 0.1–1.0)
MONOS PCT: 8 %
NEUTROS ABS: 6 10*3/uL (ref 1.7–7.7)
NEUTROS PCT: 76 %
Platelets: 241 10*3/uL (ref 150–400)
RBC: 3.8 MIL/uL — ABNORMAL LOW (ref 4.22–5.81)
RDW: 13 % (ref 11.5–15.5)
WBC: 7.8 10*3/uL (ref 4.0–10.5)

## 2016-01-29 LAB — I-STAT CG4 LACTIC ACID, ED: Lactic Acid, Venous: 0.8 mmol/L (ref 0.5–1.9)

## 2016-01-29 IMAGING — CT CT ABD-PELV W/ CM
2 of 5 series · 13 of 46 positions shown, 15 images · IV contrast (ISOVUE)
Comparison: None.

ADDENDUM:
There is arthropathy in the left hip joint with findings concerning
for a degree of avascular necrosis in the left femoral head.

Suspect rounded atelectasis left base. As this area potentially
could represent an underlying nodular lesion, a follow-up CT of the
chest in approximately 3 months would be advisable.
CLINICAL DATA: Pelvic and scrotal region pain. HIV positive.
Drainage of hemorrhage from the scrotal sacs
EXAM:
CT ABDOMEN AND PELVIS WITH CONTRAST
TECHNIQUE: Multidetector CT imaging of the abdomen and pelvis was performed
using the standard protocol following bolus administration of
intravenous contrast.
CONTRAST:  100mL [J8] IOPAMIDOL ([J8]) INJECTION 61%

[Series 2: abd/pel with · axial · 0.74mm/px · z∈[-534,-14]mm · 10 of 118 slices shown, 12 images]
[im 7/118  soft-tissue]
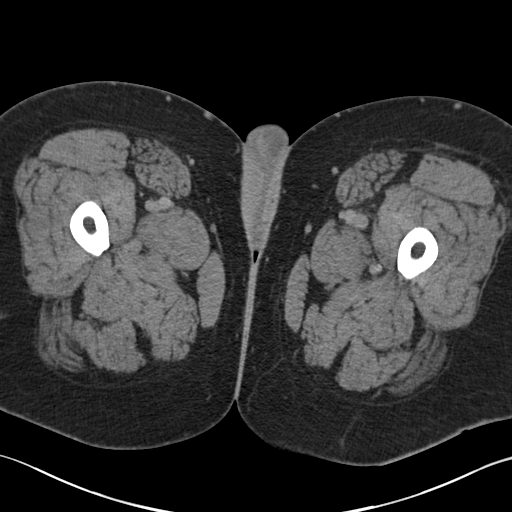
[im 7/118  bone]
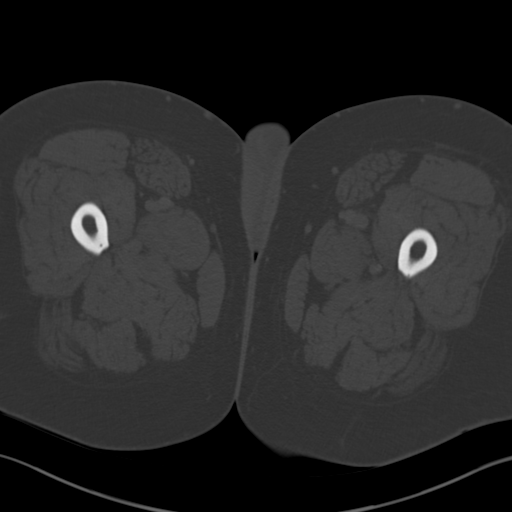
[im 19/118  soft-tissue]
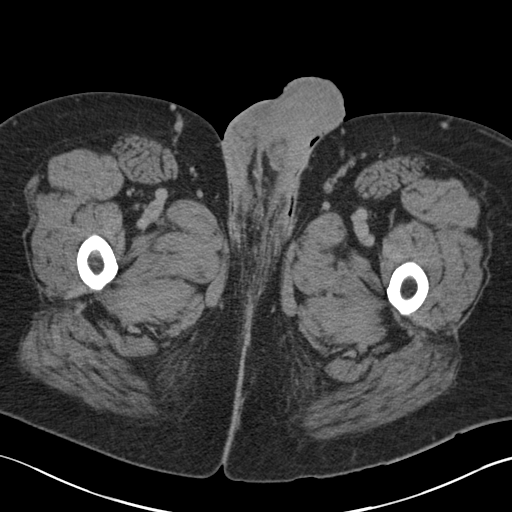
[im 31/118  soft-tissue]
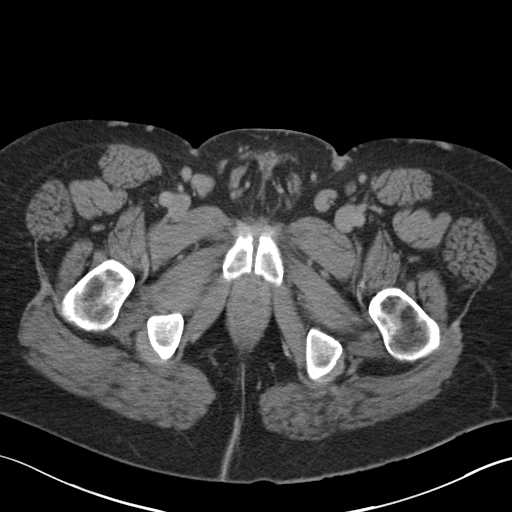
[im 44/118  soft-tissue]
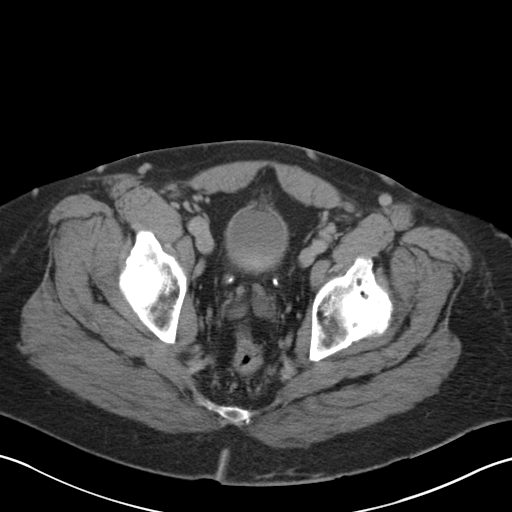
[im 56/118  soft-tissue]
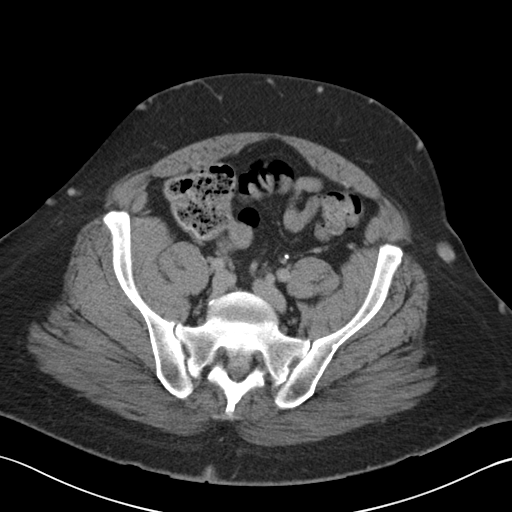
[im 62/118  soft-tissue]
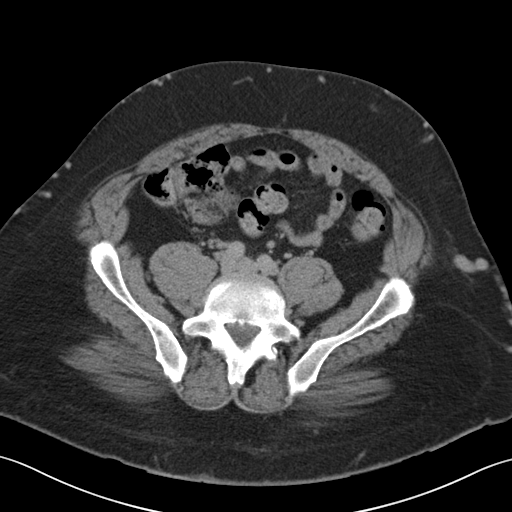
[im 74/118  soft-tissue]
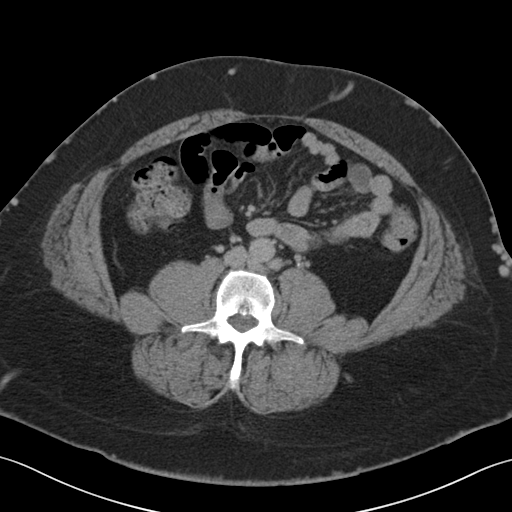
[im 87/118  soft-tissue]
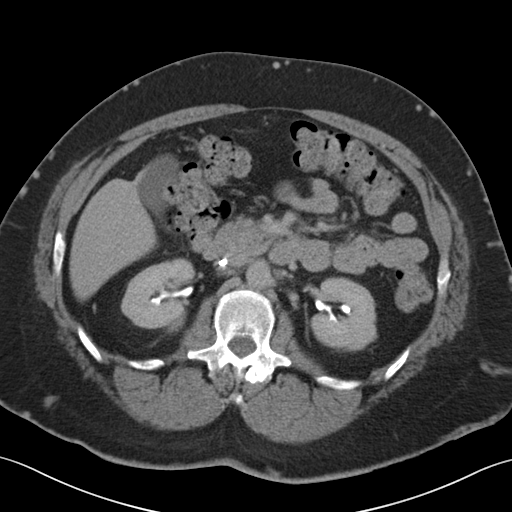
[im 99/118  soft-tissue]
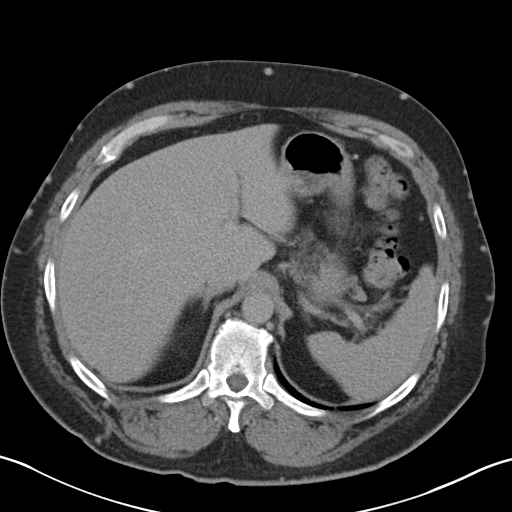
[im 99/118  bone]
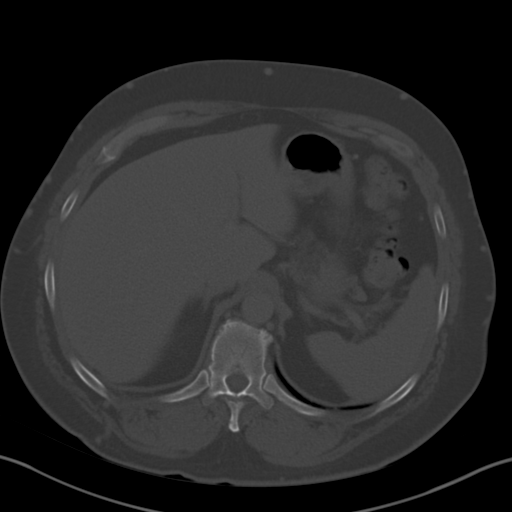
[im 111/118  soft-tissue]
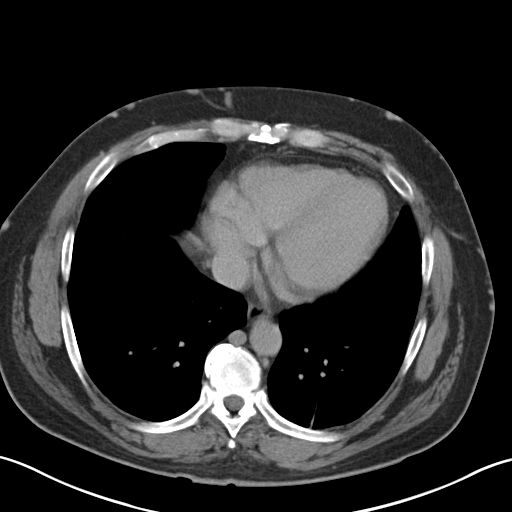

[Series 3: coronal a/|p · coronal · 0.79mm/px · 3 of 134 slices shown]
[im 45/134  soft-tissue]
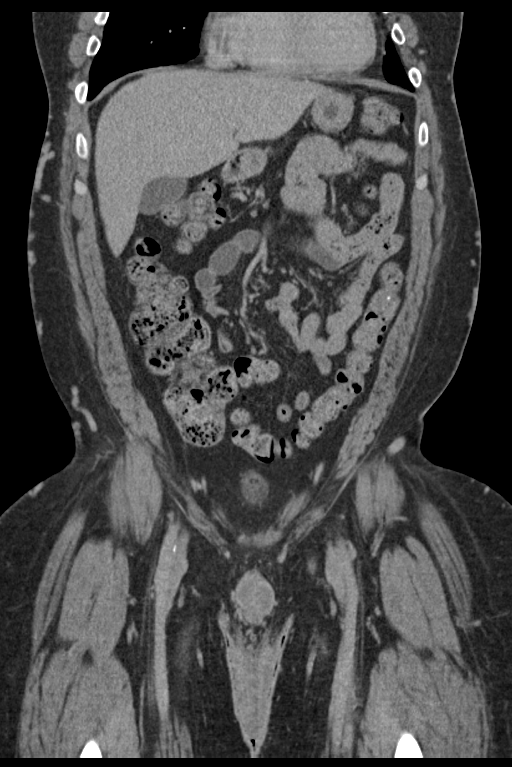
[im 60/134  soft-tissue]
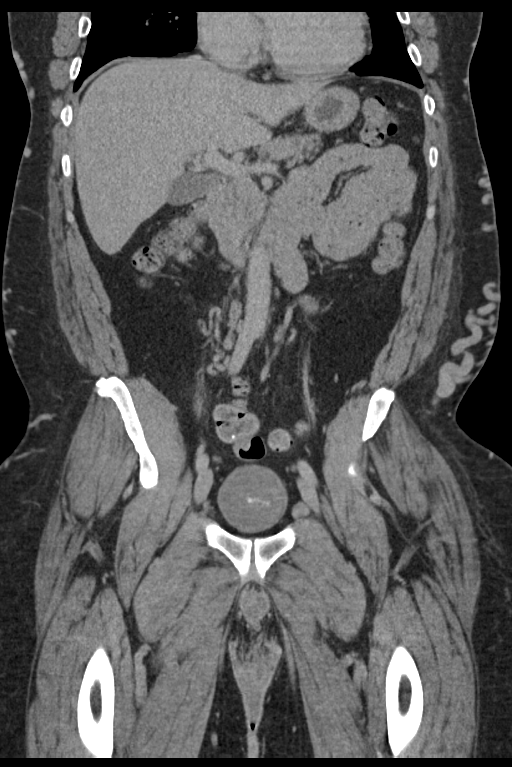
[im 74/134  soft-tissue]
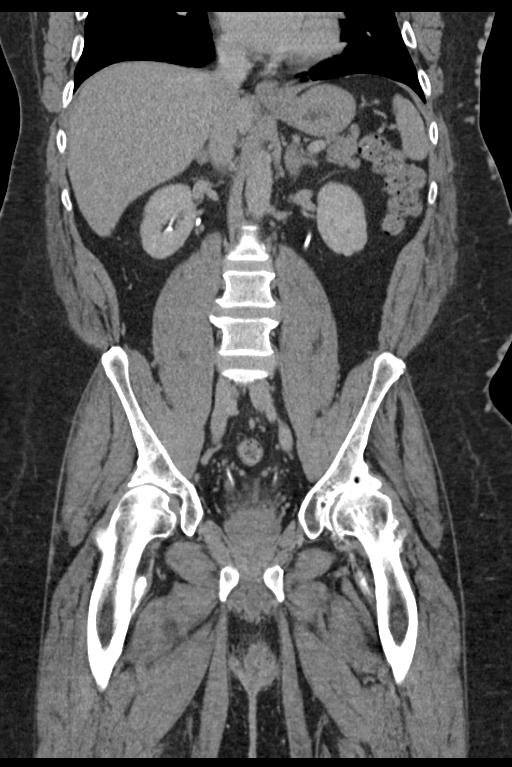

[13 of 46 positions shown; findings below may reference images not displayed]

FINDINGS: Lower chest: There is atelectatic change in the posterior left base.
In the posterior left base, there is a 1.6 x 1.0 cm nodular opacity
which may represent rounded atelectasis. There is borderline lower
lobe bronchiectatic change bilaterally.

Hepatobiliary: No focal liver lesions are evident. There is
cholelithiasis. Gallbladder wall is not appreciably thickened, and
there is no pericholecystic fluid.

Pancreas: There is no pancreatic mass or inflammatory focus.

Spleen: No splenic lesions are evident. The spleen is normal in size
and contour.

Adrenals/Urinary Tract: Adrenals appear unremarkable bilaterally.
Kidneys bilaterally show no evident mass or hydronephrosis on either
side. There is no renal or ureteral calculus on either side. The
urinary bladder wall thickness is normal. There is mild mesenteric
thickening immediately anterior to the urinary bladder.

Stomach/Bowel: There are scattered sigmoid diverticula without
diverticulitis. There is no bowel wall or mesenteric thickening.
There is no evident bowel obstruction. No free air or portal venous
air is evident.

Vascular/Lymphatic: There is no abdominal aortic aneurysm. Major
mesenteric vessels appear patent. There is a filter in the inferior
vena cava. There are multiple collateral vessels along the anterior
and pelvic wall regions. There are prominent inguinal lymph nodes
bilaterally. The largest inguinal lymph node is on the right
medially measuring 2.1 x 1.7 cm. There is a left common femoral
chain lymph node measuring 1.2 x 0.9 cm. There is a right common
femoral lymph node measuring 1.4 x 1.3 cm. More superiorly, there is
no adenopathy by size criteria. There are a few subcentimeter
retroperitoneal lymph nodes which do not meet size criteria for
pathologic significance.

Reproductive: There is wall thickening in both scrotal regions with
a small amount of air in the wall of the right scrotal sac. Minimal
air is noted in the left scrotal sac. Small hydroceles are noted
bilaterally. There are a few prostatic calculi. Prostate and seminal
vesicles appear normal in size and contour.

Other: There is no periappendiceal region inflammation. There is a
small ventral hernia containing only fat. No abscess or ascites is
seen within the peritoneal retroperitoneum of the abdomen and
pelvis.

Musculoskeletal: There is marked osteoarthritic change in the left
hip joint with findings concerning for a degree of avascular
necrosis in the left femoral head. No blastic or lytic bone lesions
are identified. There is no intramuscular lesion.
IMPRESSION: There is wall thickening and mild air in the wall of each scrotal
sac. Small hydroceles noted bilaterally.

Mild pelvic and inguinal adenopathy. Question adenopathy due to the
inflammation from the changes in the scrotum.

Filter in inferior vena cava. Multiple collaterals are noted
throughout the abdominal and pelvic wall regions.

Cholelithiasis.  Gallbladder wall not thickened by CT.

Small ventral hernia containing only fat.

No adenopathy is noted in the abdominal or upper pelvic regions.
Graft inflammation is noted anterior to the urinary bladder in the
mesenteric. This finding may be indicative of a degree of cystitis.
The urinary bladder wall itself does not appear thickened, however.
There is no hydronephrosis. No renal or ureteral calculi are
evident.

There are scattered colonic diverticula without diverticulitis. No
bowel obstruction. No periappendiceal region inflammation.

## 2016-01-29 MED ORDER — MORPHINE SULFATE (PF) 2 MG/ML IV SOLN
4.0000 mg | Freq: Once | INTRAVENOUS | Status: AC
  Administered 2016-01-29: 4 mg via INTRAVENOUS
  Filled 2016-01-29: qty 2

## 2016-01-29 MED ORDER — CLINDAMYCIN HCL 150 MG PO CAPS: 450.0000 mg | ORAL_CAPSULE | Freq: Four times a day (QID) | ORAL | 0 refills | Status: AC

## 2016-01-29 MED ORDER — ONDANSETRON HCL 4 MG/2ML IJ SOLN
4.0000 mg | Freq: Once | INTRAMUSCULAR | Status: AC
  Administered 2016-01-29: 4 mg via INTRAVENOUS
  Filled 2016-01-29: qty 2

## 2016-01-29 MED ORDER — CLINDAMYCIN PHOSPHATE 900 MG/50ML IV SOLN
900.0000 mg | Freq: Once | INTRAVENOUS | Status: AC
  Administered 2016-01-29: 900 mg via INTRAVENOUS
  Filled 2016-01-29: qty 50

## 2016-01-29 MED ORDER — IOPAMIDOL (ISOVUE-300) INJECTION 61%
100.0000 mL | Freq: Once | INTRAVENOUS | Status: AC | PRN
  Administered 2016-01-29: 100 mL via INTRAVENOUS
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
## 2016-01-29 NOTE — Progress Notes (Signed)
Spoke with pt and male family member at bedside Pt confirms he just moved to [place] from [place] "two months ago" Pt awaiting iv access placement Pt [place]  Cm discussed with pt that there is differences in [place] and [place] medicaid.  Discussed reviewing the medicaid application on [place] medicaid website  Cm also reviewed [place] uninsured resources for pcps, financial assistance, senior services, dental resources, housing, DSS, health dept, SSI office, etc  Pt agreed to giving the male family member the list of resources for review. Pt is not eligible for [facility] services at this time until he no longer is covered by his medicaid of [place] Pt appreciative of resources provided

## 2016-01-29 NOTE — Discharge Instructions (Signed)
Call and follow up with a surgeon.   Warm compresses or soaks 4 times a day.

## 2016-01-29 NOTE — ED Notes (Signed)
Patient transported to CT 

## 2016-01-29 NOTE — ED Notes (Signed)
Unsuccessful IV attempt x 2.  Primary RN made aware and IV team consult placed.

## 2016-01-29 NOTE — ED Triage Notes (Signed)
Pt c/o left leg edema, chest pain, and enlarged, edematous testicles with bloody discharge. Reports hx DVTs to legs, reports these symptoms are similar.

## 2016-01-29 NOTE — ED Notes (Signed)
Patient was alert, oriented and stable upon discharge. RN went over AVS and patient had no further questions.  

## 2016-03-18 ENCOUNTER — Ambulatory Visit (INDEPENDENT_AMBULATORY_CARE_PROVIDER_SITE_OTHER): Payer: Medicaid Other | Admitting: Internal Medicine

## 2016-03-18 VITALS — BP 146/66 | HR 59 | Temp 98.5°F | Ht 72.0 in | Wt 297.0 lb

## 2016-03-18 DIAGNOSIS — K8020 Calculus of gallbladder without cholecystitis without obstruction: Secondary | ICD-10-CM | POA: Insufficient documentation

## 2016-03-18 DIAGNOSIS — Z23 Encounter for immunization: Secondary | ICD-10-CM | POA: Diagnosis not present

## 2016-03-18 DIAGNOSIS — I82409 Acute embolism and thrombosis of unspecified deep veins of unspecified lower extremity: Secondary | ICD-10-CM | POA: Insufficient documentation

## 2016-03-18 DIAGNOSIS — N289 Disorder of kidney and ureter, unspecified: Secondary | ICD-10-CM

## 2016-03-18 DIAGNOSIS — B20 Human immunodeficiency virus [HIV] disease: Secondary | ICD-10-CM | POA: Insufficient documentation

## 2016-03-18 DIAGNOSIS — Z21 Asymptomatic human immunodeficiency virus [HIV] infection status: Secondary | ICD-10-CM

## 2016-03-18 DIAGNOSIS — N492 Inflammatory disorders of scrotum: Secondary | ICD-10-CM

## 2016-03-18 DIAGNOSIS — L02412 Cutaneous abscess of left axilla: Secondary | ICD-10-CM

## 2016-03-18 DIAGNOSIS — N1831 Chronic kidney disease, stage 3a: Secondary | ICD-10-CM | POA: Insufficient documentation

## 2016-03-18 DIAGNOSIS — B9689 Other specified bacterial agents as the cause of diseases classified elsewhere: Secondary | ICD-10-CM

## 2016-03-18 DIAGNOSIS — I82402 Acute embolism and thrombosis of unspecified deep veins of left lower extremity: Secondary | ICD-10-CM

## 2016-03-18 DIAGNOSIS — K5730 Diverticulosis of large intestine without perforation or abscess without bleeding: Secondary | ICD-10-CM | POA: Insufficient documentation

## 2016-03-18 DIAGNOSIS — I824Y2 Acute embolism and thrombosis of unspecified deep veins of left proximal lower extremity: Secondary | ICD-10-CM

## 2016-03-18 DIAGNOSIS — N189 Chronic kidney disease, unspecified: Secondary | ICD-10-CM | POA: Insufficient documentation

## 2016-03-18 DIAGNOSIS — Z7185 Encounter for immunization safety counseling: Secondary | ICD-10-CM | POA: Insufficient documentation

## 2016-03-18 DIAGNOSIS — Z7189 Other specified counseling: Secondary | ICD-10-CM | POA: Insufficient documentation

## 2016-03-18 DIAGNOSIS — L02419 Cutaneous abscess of limb, unspecified: Secondary | ICD-10-CM

## 2016-03-18 DIAGNOSIS — Z87438 Personal history of other diseases of male genital organs: Secondary | ICD-10-CM

## 2016-03-18 DIAGNOSIS — N183 Chronic kidney disease, stage 3 unspecified: Secondary | ICD-10-CM | POA: Insufficient documentation

## 2016-03-18 MED ORDER — RIVAROXABAN 20 MG PO TABS: 20.0000 mg | ORAL_TABLET | Freq: Every morning | ORAL | 3 refills | Status: DC

## 2016-03-18 MED ORDER — CIPROFLOXACIN HCL 500 MG PO TABS: 500.0000 mg | ORAL_TABLET | Freq: Two times a day (BID) | ORAL | 0 refills | Status: DC

## 2016-03-18 MED ORDER — FLUCONAZOLE 200 MG PO TABS: 200.0000 mg | ORAL_TABLET | Freq: Every day | ORAL | 0 refills | Status: AC

## 2016-03-18 MED ORDER — DARUNAVIR ETHANOLATE 800 MG PO TABS: 800.0000 mg | ORAL_TABLET | Freq: Every day | ORAL | 3 refills | Status: DC

## 2016-03-18 MED ORDER — CLINDAMYCIN HCL 300 MG PO CAPS: 600.0000 mg | ORAL_CAPSULE | Freq: Three times a day (TID) | ORAL | 0 refills | Status: DC

## 2016-03-18 MED ORDER — ELVITEG-COBIC-EMTRICIT-TENOFAF 150-150-200-10 MG PO TABS: 1.0000 | ORAL_TABLET | Freq: Every day | ORAL | 3 refills | Status: DC

## 2016-03-18 NOTE — Patient Instructions (Signed)
Please continue to take your medications as prescribed.  We have prescribed an antibiotic (Clindamycin) and an antifungal medication (Fluconazole) to take to treat you skin infections and your itchy skin rash.   We will call if any of your lab results return abnormal.  Our schedulers will call to schedule a repeat ultrasound of the infection in your scrotum within the next week.  Please return in 1 month to follow up on these medical problems!  Take care and happy holidays!

## 2016-03-18 NOTE — Assessment & Plan Note (Signed)
Creatinine 1.7 in September and again in October 2017 during ED presentations for recurrent scrotal abscess. 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  Plan: - Follow repeat BMP today, may have baseline Cr 1.7

## 2016-03-18 NOTE — Assessment & Plan Note (Addendum)
Reports intermittent left leg swelling, worse at the end of the day. Has been off Xarelto for 2 months. 1+ pitting edema to knees bilaterally, calves non-tender, no dyspnea on exam.  Plan: - Restart Xarelto 20mg  daily - Some concern for interaction between Xarelto and Genvoya, can increase levels of Xarelto and put him at risk for bleeding events. Although he reports taking these medications for years before. If develops bleeding can consider switching to Eliquis.

## 2016-03-18 NOTE — Assessment & Plan Note (Signed)
Provided flu vaccine 

## 2016-03-18 NOTE — Assessment & Plan Note (Addendum)
HIV since 2007, previously managed by ID physician in [place]. Plans to follow with Dr. [name] at [facility] and in process of having records faxed. Reports has been off ART for 2 months, recently moved from [place]. Previously on [place]. Reports he had an undetectable viral load in May 2017, is sexually active with his wife and they do not always use barrier protection, no recent intercourse in past 2 months. She reports she has been tested negative earlier this year.  Plan: - Check viral load and CD4 today - Reordered Prezista and Genvoya at previous doses  Addendum: CD4 count 140, which is under 200 and necessitates prophylaxis for pneumocystis pneumonia. Given allergy to sulfa antibiotics cannot rx Bactrim or Dapsone, will Rx Atovaquone 1500mg  daily with breakfast

## 2016-03-18 NOTE — Progress Notes (Addendum)
   CC: Establish care, ED follow up for scrotal abscess, HIV and DVT off medications  HPI:  Mr.Leevon D Hemme is a 54 y.o. male with PMHx detailed below presenting to establish care, history of HIV and DVT currently off treatment, and follow up for recent ED visits for scrotal abscess.  See problem based assessment and plan below for additional details.  Past Medical History:  Diagnosis Date  . Abscess   . DVT (deep venous thrombosis) ([place])   . HIV (human immunodeficiency virus infection) ([place])    Review of Systems: Review of Systems  Constitutional: Positive for chills. Negative for fever, malaise/fatigue and weight loss.  HENT: Negative for sore throat.   Respiratory: Positive for shortness of breath. Negative for cough and sputum production.   Cardiovascular: Positive for chest pain (intermittent with exertion) and leg swelling. Negative for palpitations.  Gastrointestinal: Negative for abdominal pain, constipation, diarrhea, nausea and vomiting.  Genitourinary: Negative for dysuria, frequency and urgency.       Draining scrotal abscess  Skin: Positive for itching and rash.  All other systems reviewed and are negative.   Physical Exam: Vitals:   03/18/16 1404  BP: (!) 146/66  Pulse: (!) 59  Temp: 98.5 F (36.9 C)  TempSrc: Oral  SpO2: 100%  Weight: 297 lb (134.7 kg)  Height: 6' (1.829 m)   Body mass index is 40.28 kg/m. GENERAL- Obese gentleman sitting comfortably in exam room chair, alert, in no distress HEENT- Atraumatic, PERRL, EOMI, moist mucous membranes CARDIAC- Regular rate and rhythm, no murmurs, rubs or gallops. RESP- Clear to ascultation bilaterally, no wheezing or crackles, normal work of breathing ABDOMEN- Obese, soft, nontender, nondistended GENITOURINARY- Moderate swelling of bilateral testicles with serous to purulent drainage from several openings on either side, induration/discoloration spreading into groin and down medial thighs bilaterally, painful  and itchy EXTREMITIES- Area of induration and purulent draining abscess in left axilla, otherwise normal bulk and range of motion, 1+ pitting edema to knees bilaterally SKIN- Warm, dry, areas of induration and abscess in groin and axilla PSYCH- Appropriate affect, clear speech, thoughts linear and goal-directed  Assessment & Plan:   See encounters tab for problem based medical decision making.  Patient seen with Dr. Daryll Drown

## 2016-03-19 DIAGNOSIS — M87052 Idiopathic aseptic necrosis of left femur: Secondary | ICD-10-CM | POA: Insufficient documentation

## 2016-03-19 LAB — LIPID PANEL
CHOL/HDL RATIO: 2.1 ratio (ref 0.0–5.0)
CHOLESTEROL TOTAL: 137 mg/dL (ref 100–199)
HDL: 64 mg/dL (ref >39)
LDL Calculated: 61 mg/dL (ref 0–99)
TRIGLYCERIDES: 61 mg/dL (ref 0–149)
VLDL Cholesterol Cal: 12 mg/dL (ref 5–40)

## 2016-03-19 LAB — BMP8+ANION GAP
ANION GAP: 17 mmol/L (ref 10.0–18.0)
BUN/Creatinine Ratio: 10 (ref 9–20)
BUN: 14 mg/dL (ref 6–24)
CALCIUM: 9.3 mg/dL (ref 8.7–10.2)
CHLORIDE: 100 mmol/L (ref 96–106)
CO2: 24 mmol/L (ref 18–29)
Creatinine, Ser: 1.38 mg/dL — ABNORMAL HIGH (ref 0.76–1.27)
GFR calc Af Amer: 65 mL/min/{1.73_m2} (ref >59)
GFR, EST NON AFRICAN AMERICAN: 56 mL/min/{1.73_m2} — AB (ref >59)
GLUCOSE: 98 mg/dL (ref 65–99)
POTASSIUM: 4.3 mmol/L (ref 3.5–5.2)
SODIUM: 141 mmol/L (ref 134–144)

## 2016-03-19 LAB — HIV-1 RNA QUANT-NO REFLEX-BLD
HIV 1 RNA VIRAL LOAD LOG: 4.675 {Log_copies}/mL
HIV 1 RNA VIRAL LOAD: 47300 {copies}/mL

## 2016-03-19 LAB — T-HELPER CELLS (CD4) COUNT (NOT AT ARMC)
CD4 % Helper T Cell: 6 % — ABNORMAL LOW (ref 33–55)
CD4 T Cell Abs: 140 /uL — ABNORMAL LOW (ref 400–2700)

## 2016-03-19 MED ORDER — ATOVAQUONE 750 MG/5ML PO SUSP: 1500.0000 mg | Freq: Every day | ORAL | 4 refills | Status: DC

## 2016-03-19 NOTE — Assessment & Plan Note (Signed)
Draining abscess of left axilla, weeping seropurulent fluid, painful, indurated. Reports issues with this area in the past perhaps related to hidradenitis.  Plan: - Follow wound culture from this site - Prescribed Clindamycin 600mg  TID for 7 days - Encouraged daily hygiene and gauze bandages to absorb drainage

## 2016-03-19 NOTE — Assessment & Plan Note (Addendum)
History of abscess of right scrotum earlier this year requiring incision/drainage/hospitalization back in [place]. Symptoms had improved but recurred in early September with worsening swelling, pain, induration and purulent drainage in left upper thigh and scrotum. Seen in ED 9/14 and underwent I/D, scrotal ultrasound showed bilateral epididymoorchitis at that time, sent home to complete 3 week course of Doxycycline. Drainage, pain, induration persisted and he returned to ED on 10/30 - CT at that time showed no deep abscess or Fournier's and he was discharged on 10 day course of Clindamycin. He reports his symptoms improved with the antibiotics but have since returned over the past few weeks with persistent drainage, testicular and thigh skin pain. He denies fevers but endorses chills and fatigue. The drainage stains his clothes, requiring frequent clothing changes. He reports being out of his HIV medications for about two months.   On exam, indurated/hyperpigmented rash covering groin and upper medial thigh bilaterally, area is very moist and  patient reports this rash is itchy and painful and has been present for months, no visible satellite lesions at border. Scrotum mildly tender to palpation, actively draining purulent fluid from 2-3 sites on lateral aspect, appears mildly swollen but no obvious deformity or fluid collection. He has minimal constitutional symptoms, stable vitals, and appears well despite these infections.  Plan: - Follow wound culture from abscess drainage - Prescribed Clindamycin 600mg  TID for 7 days - Rash concerning for candida vs dermatophyte infection - Rx Fluconazole 200 mg for 7 days - Restarting ART for HIV - Advised to return to clinic or ER if symptoms acutely worsen

## 2016-03-22 ENCOUNTER — Other Ambulatory Visit: Payer: Self-pay | Admitting: Internal Medicine

## 2016-03-22 DIAGNOSIS — N492 Inflammatory disorders of scrotum: Secondary | ICD-10-CM

## 2016-03-22 LAB — WOUND CULTURE: ORGANISM ID, BACTERIA: NONE SEEN

## 2016-03-22 NOTE — Progress Notes (Signed)
Internal Medicine Clinic Attending  I saw and evaluated the patient.  I personally confirmed the key portions of the history and exam documented by Dr. Johnson and I reviewed pertinent patient test results.  The assessment, diagnosis, and plan were formulated together and I agree with the documentation in the resident's note.  

## 2016-03-23 LAB — WOUND CULTURE: ORGANISM ID, BACTERIA: NONE SEEN

## 2016-03-26 ENCOUNTER — Ambulatory Visit (HOSPITAL_COMMUNITY)
Admission: RE | Admit: 2016-03-26 | Discharge: 2016-03-26 | Disposition: A | Discharge status: Home / Self Care | Payer: Medicaid Other | Source: Ambulatory Visit | Attending: Internal Medicine | Admitting: Internal Medicine

## 2016-03-26 DIAGNOSIS — N492 Inflammatory disorders of scrotum: Secondary | ICD-10-CM | POA: Insufficient documentation

## 2016-04-05 IMAGING — US US ART/VEN ABD/PELV/SCROTUM DOPPLER LTD
1 series · 13 of 25 positions shown · non-contrast
Comparison: Ultrasound of the scrotum of [DATE] and CT abdomen
pelvis of [DATE]

CLINICAL DATA: Patient had a right scrotal sac boil lanced 6 months
ago which is still draining

EXAM:
ULTRASOUND OF SCROTUM
TECHNIQUE: Complete ultrasound examination of the testicles, epididymis, and
other scrotal structures was performed.

[Series 1: us art/ven abd/pelv/scrotum doppler ltd · 0.06mm/px · 13 of 81 slices shown]
[im 1/81]
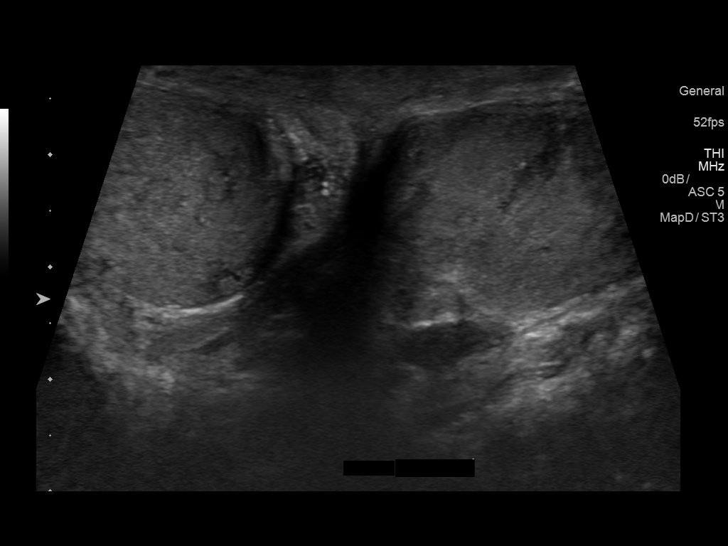
[im 7/81]
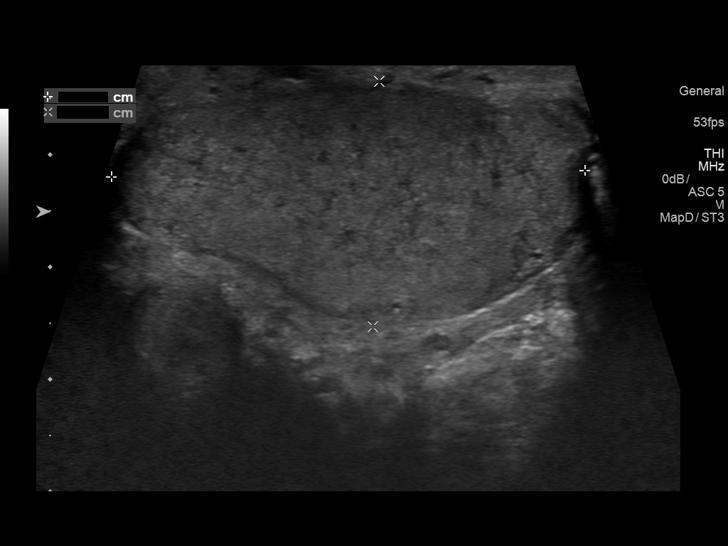
[im 14/81]
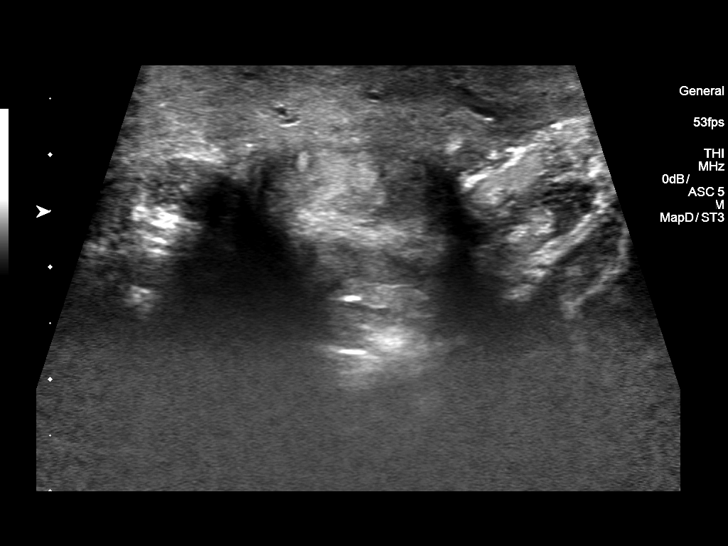
[im 21/81]
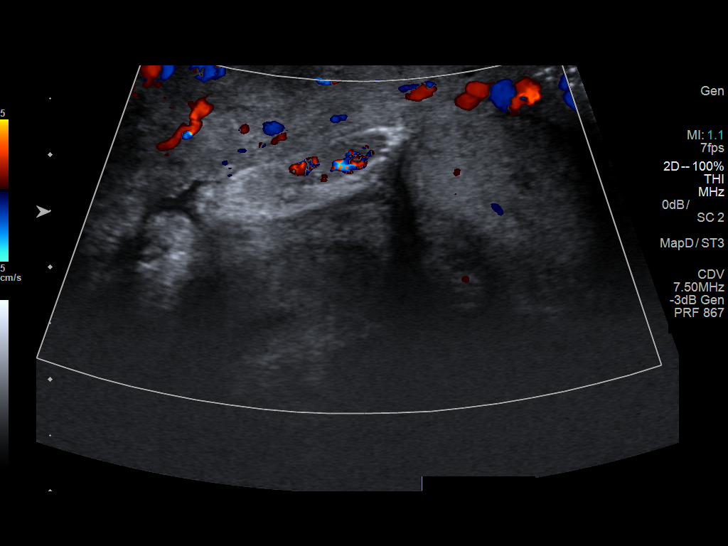
[im 27/81]
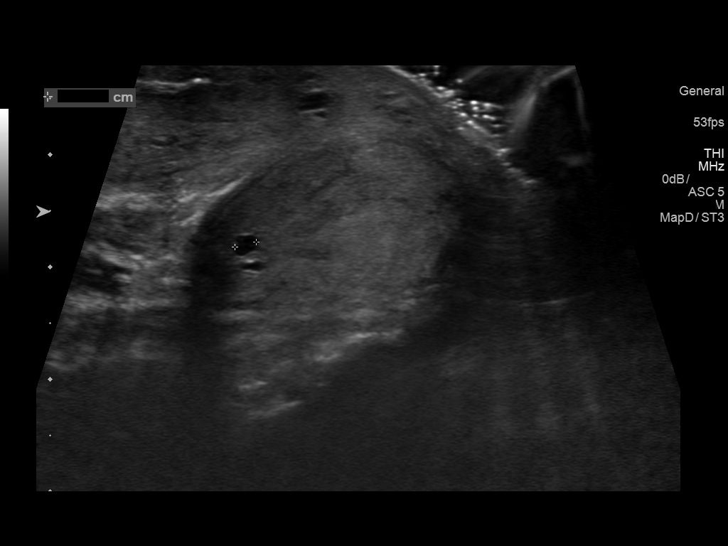
[im 34/81]
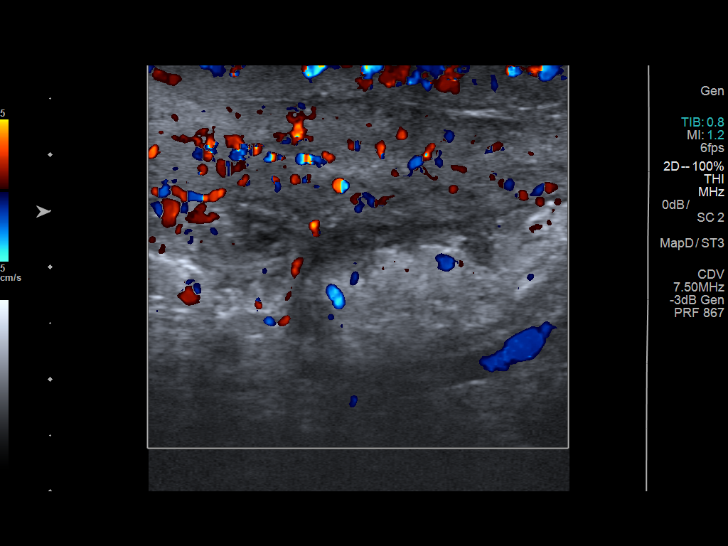
[im 41/81]
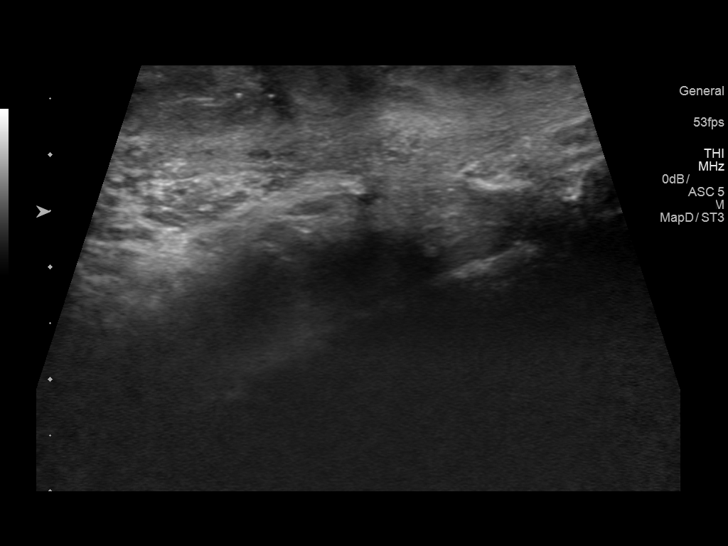
[im 47/81]
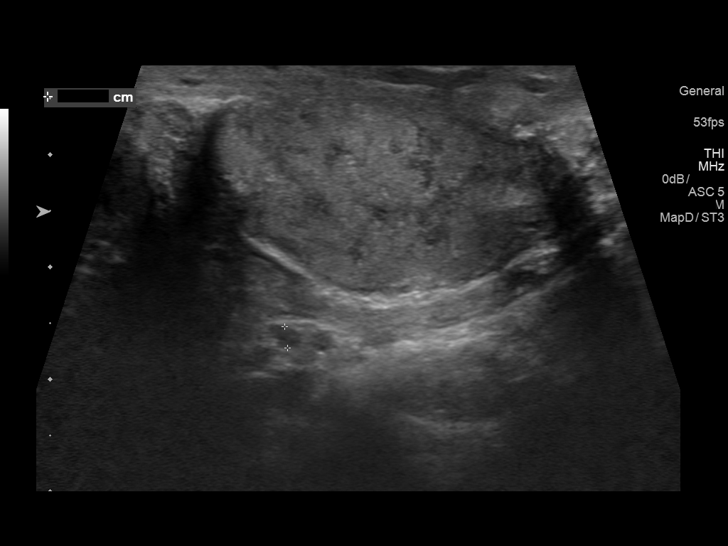
[im 54/81]
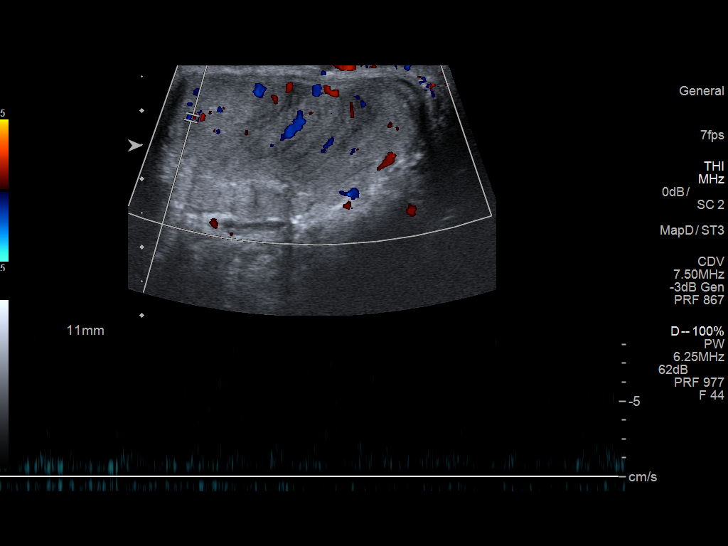
[im 61/81]
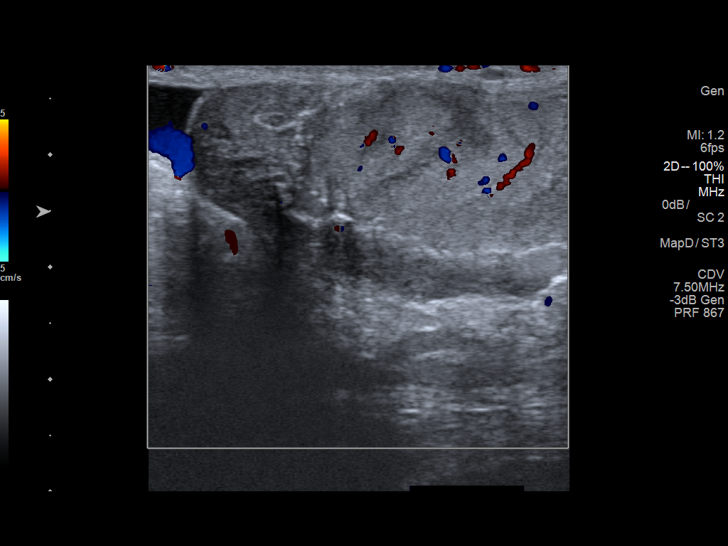
[im 67/81]
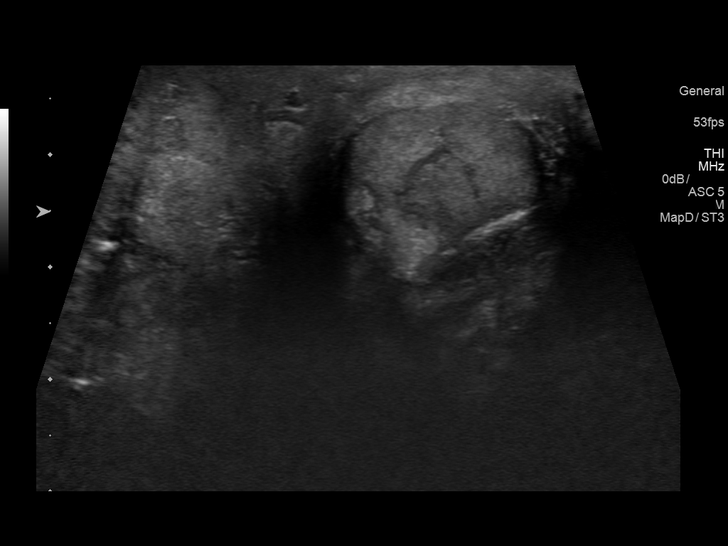
[im 74/81]
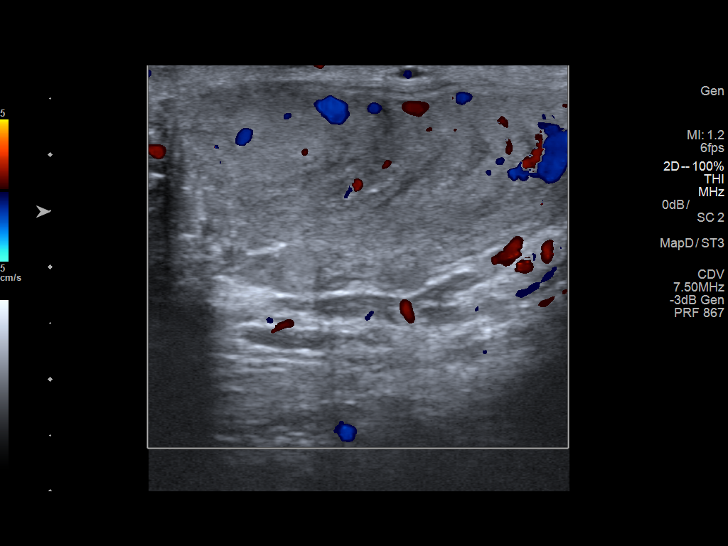
[im 81/81]
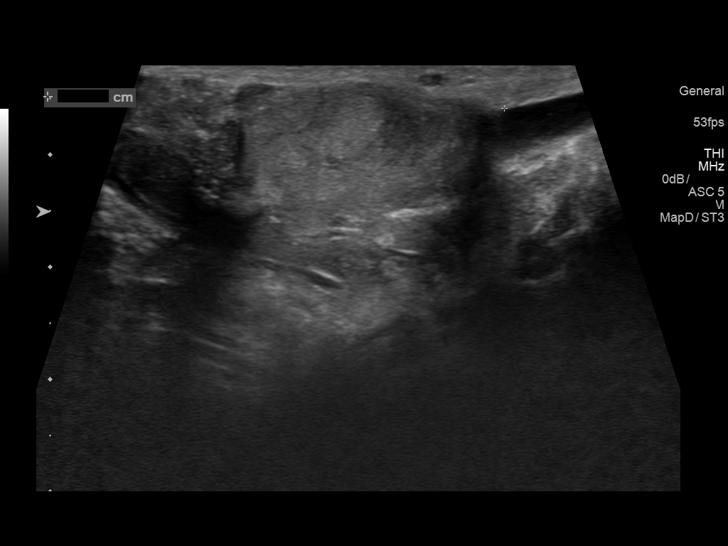

[13 of 25 positions shown; findings below may reference images not displayed]

FINDINGS: Right testicle

Measurements: The right testicle measures 4.2 x 2.2 x 3.0 cm.. No
intratesticular abnormality is seen other than a tiny 3 mm
intratesticular cyst. Blood flow is demonstrated to the right
testicle with arterial and venous waveforms.

Left testicle

Measurements: The left testicle measures 4.0 x 1.8 x 2.4 cm.. No
intratesticular abnormality is seen. Blood flow is demonstrated to
the left testicle with O2 ileum venous waveforms.

Right epididymis:  The right epididymis is unremarkable.

Left epididymis:  The small 5 mm left epididymal cyst is present.

Hydrocele:  No hydrocele is seen.

Varicocele:  No varicocele is noted.

There is diffusely thickened scrotal wall consistent with edema. No
discrete abscess is noted
IMPRESSION: 1. Diffusely thickened scrotal wall most consistent with edema. No
definite abscess.
2. No intratesticular abnormality is seen. Blood flow is
demonstrated to both testicles.

## 2016-04-10 ENCOUNTER — Telehealth: Payer: Self-pay | Admitting: Lab

## 2016-04-10 NOTE — Telephone Encounter (Signed)
Duplicate made in error 

## 2016-04-10 NOTE — Telephone Encounter (Signed)
I left a message for the patient to call his Dr's office to make an appointment.

## 2016-04-24 ENCOUNTER — Ambulatory Visit: Payer: Medicaid Other | Admitting: Internal Medicine

## 2016-04-26 ENCOUNTER — Encounter: Payer: Self-pay | Admitting: Internal Medicine

## 2016-04-26 ENCOUNTER — Ambulatory Visit (INDEPENDENT_AMBULATORY_CARE_PROVIDER_SITE_OTHER): Payer: Medicaid Other | Admitting: Internal Medicine

## 2016-04-26 ENCOUNTER — Encounter (HOSPITAL_COMMUNITY): Payer: Self-pay | Admitting: Internal Medicine

## 2016-04-26 ENCOUNTER — Inpatient Hospital Stay (HOSPITAL_COMMUNITY)
Admission: AD | Admit: 2016-04-26 | Discharge: 2016-04-27 | DRG: 606 | Disposition: A | Discharge status: Home / Self Care | Payer: Medicaid Other | Source: Ambulatory Visit | Attending: Internal Medicine | Admitting: Internal Medicine

## 2016-04-26 ENCOUNTER — Inpatient Hospital Stay (HOSPITAL_COMMUNITY): Payer: Medicaid Other

## 2016-04-26 VITALS — BP 146/67 | HR 58 | Temp 98.3°F | Wt 304.4 lb

## 2016-04-26 DIAGNOSIS — L732 Hidradenitis suppurativa: Secondary | ICD-10-CM | POA: Diagnosis present

## 2016-04-26 DIAGNOSIS — L02419 Cutaneous abscess of limb, unspecified: Secondary | ICD-10-CM | POA: Insufficient documentation

## 2016-04-26 DIAGNOSIS — Z86718 Personal history of other venous thrombosis and embolism: Secondary | ICD-10-CM | POA: Insufficient documentation

## 2016-04-26 DIAGNOSIS — Z9889 Other specified postprocedural states: Secondary | ICD-10-CM | POA: Diagnosis not present

## 2016-04-26 DIAGNOSIS — Z6841 Body Mass Index (BMI) 40.0 and over, adult: Secondary | ICD-10-CM | POA: Insufficient documentation

## 2016-04-26 DIAGNOSIS — Z872 Personal history of diseases of the skin and subcutaneous tissue: Secondary | ICD-10-CM

## 2016-04-26 DIAGNOSIS — Z7901 Long term (current) use of anticoagulants: Secondary | ICD-10-CM | POA: Diagnosis not present

## 2016-04-26 DIAGNOSIS — B9620 Unspecified Escherichia coli [E. coli] as the cause of diseases classified elsewhere: Secondary | ICD-10-CM | POA: Insufficient documentation

## 2016-04-26 DIAGNOSIS — R0609 Other forms of dyspnea: Secondary | ICD-10-CM

## 2016-04-26 DIAGNOSIS — R079 Chest pain, unspecified: Secondary | ICD-10-CM | POA: Insufficient documentation

## 2016-04-26 DIAGNOSIS — B20 Human immunodeficiency virus [HIV] disease: Secondary | ICD-10-CM

## 2016-04-26 DIAGNOSIS — F1411 Cocaine abuse, in remission: Secondary | ICD-10-CM

## 2016-04-26 DIAGNOSIS — R635 Abnormal weight gain: Secondary | ICD-10-CM

## 2016-04-26 DIAGNOSIS — R0789 Other chest pain: Secondary | ICD-10-CM | POA: Insufficient documentation

## 2016-04-26 DIAGNOSIS — F1011 Alcohol abuse, in remission: Secondary | ICD-10-CM | POA: Diagnosis not present

## 2016-04-26 DIAGNOSIS — Z21 Asymptomatic human immunodeficiency virus [HIV] infection status: Secondary | ICD-10-CM | POA: Diagnosis not present

## 2016-04-26 DIAGNOSIS — Z8249 Family history of ischemic heart disease and other diseases of the circulatory system: Secondary | ICD-10-CM

## 2016-04-26 DIAGNOSIS — R0602 Shortness of breath: Secondary | ICD-10-CM | POA: Diagnosis not present

## 2016-04-26 DIAGNOSIS — N492 Inflammatory disorders of scrotum: Secondary | ICD-10-CM

## 2016-04-26 DIAGNOSIS — B955 Unspecified streptococcus as the cause of diseases classified elsewhere: Secondary | ICD-10-CM | POA: Insufficient documentation

## 2016-04-26 DIAGNOSIS — I82409 Acute embolism and thrombosis of unspecified deep veins of unspecified lower extremity: Secondary | ICD-10-CM | POA: Diagnosis present

## 2016-04-26 DIAGNOSIS — Z79899 Other long term (current) drug therapy: Secondary | ICD-10-CM | POA: Insufficient documentation

## 2016-04-26 DIAGNOSIS — B9689 Other specified bacterial agents as the cause of diseases classified elsewhere: Secondary | ICD-10-CM

## 2016-04-26 DIAGNOSIS — Z881 Allergy status to other antibiotic agents status: Secondary | ICD-10-CM | POA: Diagnosis not present

## 2016-04-26 DIAGNOSIS — Z87891 Personal history of nicotine dependence: Secondary | ICD-10-CM | POA: Diagnosis not present

## 2016-04-26 DIAGNOSIS — Z882 Allergy status to sulfonamides status: Secondary | ICD-10-CM | POA: Diagnosis not present

## 2016-04-26 DIAGNOSIS — Z95828 Presence of other vascular implants and grafts: Secondary | ICD-10-CM | POA: Diagnosis not present

## 2016-04-26 DIAGNOSIS — R911 Solitary pulmonary nodule: Secondary | ICD-10-CM

## 2016-04-26 LAB — BASIC METABOLIC PANEL
Anion gap: 6 (ref 5–15)
BUN: 16 mg/dL (ref 6–20)
CALCIUM: 9 mg/dL (ref 8.9–10.3)
CHLORIDE: 108 mmol/L (ref 101–111)
CO2: 26 mmol/L (ref 22–32)
CREATININE: 1.65 mg/dL — AB (ref 0.61–1.24)
GFR calc non Af Amer: 45 mL/min — ABNORMAL LOW (ref >60)
GFR, EST AFRICAN AMERICAN: 52 mL/min — AB (ref >60)
Glucose, Bld: 110 mg/dL — ABNORMAL HIGH (ref 65–99)
Potassium: 4.2 mmol/L (ref 3.5–5.1)
SODIUM: 140 mmol/L (ref 135–145)

## 2016-04-26 LAB — CBC WITH DIFFERENTIAL/PLATELET
BASOS PCT: 1 %
Basophils Absolute: 0 10*3/uL (ref 0.0–0.1)
EOS ABS: 0.2 10*3/uL (ref 0.0–0.7)
EOS PCT: 3 %
HCT: 38.2 % — ABNORMAL LOW (ref 39.0–52.0)
Hemoglobin: 12.6 g/dL — ABNORMAL LOW (ref 13.0–17.0)
LYMPHS ABS: 2.2 10*3/uL (ref 0.7–4.0)
Lymphocytes Relative: 27 %
MCH: 32.6 pg (ref 26.0–34.0)
MCHC: 33 g/dL (ref 30.0–36.0)
MCV: 98.7 fL (ref 78.0–100.0)
MONOS PCT: 7 %
Monocytes Absolute: 0.5 10*3/uL (ref 0.1–1.0)
Neutro Abs: 5.1 10*3/uL (ref 1.7–7.7)
Neutrophils Relative %: 62 %
PLATELETS: 247 10*3/uL (ref 150–400)
RBC: 3.87 MIL/uL — ABNORMAL LOW (ref 4.22–5.81)
RDW: 14.2 % (ref 11.5–15.5)
WBC: 8.1 10*3/uL (ref 4.0–10.5)

## 2016-04-26 LAB — TROPONIN I
Troponin I: <0.03 ng/mL (ref <0.03)
Troponin I: <0.03 ng/mL (ref <0.03)

## 2016-04-26 MED ORDER — DARUNAVIR ETHANOLATE 800 MG PO TABS
800.0000 mg | ORAL_TABLET | Freq: Every day | ORAL | Status: DC
  Administered 2016-04-27: 800 mg via ORAL
  Filled 2016-04-26: qty 1

## 2016-04-26 MED ORDER — ATOVAQUONE 750 MG/5ML PO SUSP
1500.0000 mg | Freq: Every day | ORAL | Status: DC
  Administered 2016-04-27: 1500 mg via ORAL
  Filled 2016-04-26 (×2): qty 10

## 2016-04-26 MED ORDER — VITAMIN D 1000 UNITS PO TABS
1000.0000 [IU] | ORAL_TABLET | Freq: Every day | ORAL | Status: DC
  Administered 2016-04-27: 1000 [IU] via ORAL
  Filled 2016-04-26: qty 1

## 2016-04-26 MED ORDER — CEFEPIME HCL 2 G IJ SOLR
2.0000 g | Freq: Three times a day (TID) | INTRAMUSCULAR | Status: DC
  Administered 2016-04-27 (×2): 2 g via INTRAVENOUS
  Filled 2016-04-26 (×3): qty 2

## 2016-04-26 MED ORDER — VANCOMYCIN HCL 10 G IV SOLR: 1500.0000 mg | INTRAVENOUS | Status: DC

## 2016-04-26 MED ORDER — ACETAMINOPHEN 325 MG PO TABS: 650.0000 mg | ORAL_TABLET | Freq: Four times a day (QID) | ORAL | Status: DC | PRN

## 2016-04-26 MED ORDER — VANCOMYCIN HCL 10 G IV SOLR
2500.0000 mg | Freq: Once | INTRAVENOUS | Status: AC
  Administered 2016-04-27 (×2): 2500 mg via INTRAVENOUS
  Filled 2016-04-26: qty 2500

## 2016-04-26 MED ORDER — PANTOPRAZOLE SODIUM 40 MG PO TBEC
40.0000 mg | DELAYED_RELEASE_TABLET | Freq: Every day | ORAL | Status: DC
  Administered 2016-04-27 (×2): 40 mg via ORAL
  Filled 2016-04-26 (×2): qty 1

## 2016-04-26 MED ORDER — ACETAMINOPHEN 650 MG RE SUPP: 650.0000 mg | Freq: Four times a day (QID) | RECTAL | Status: DC | PRN

## 2016-04-26 MED ORDER — ELVITEG-COBIC-EMTRICIT-TENOFAF 150-150-200-10 MG PO TABS
1.0000 | ORAL_TABLET | Freq: Every day | ORAL | Status: DC
  Administered 2016-04-27: 1 via ORAL
  Filled 2016-04-26: qty 1

## 2016-04-26 MED ORDER — FLUCONAZOLE 100 MG PO TABS
100.0000 mg | ORAL_TABLET | Freq: Every day | ORAL | Status: DC
  Administered 2016-04-27 (×2): 100 mg via ORAL
  Filled 2016-04-26 (×2): qty 1

## 2016-04-26 MED ORDER — RIVAROXABAN 20 MG PO TABS
20.0000 mg | ORAL_TABLET | Freq: Every day | ORAL | Status: DC
  Administered 2016-04-27: 20 mg via ORAL
  Filled 2016-04-26: qty 1
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
## 2016-04-26 NOTE — H&P (Signed)
Date: 04/26/2016               Patient Name:  Allen Espinoza MRN: [id number]  DOB: 1960/10/11 Age / Sex: 56 y.o., male   PCP: Asencion Partridge, MD         Medical Service: Internal Medicine Teaching Service         Attending Physician: Dr. Lucious Groves, DO    First Contact: Dr. Inda Castle Pager: [phone]  Second Contact: Dr. Juleen China Pager: [phone]       After Hours (After 5p/  First Contact Pager: [phone]  weekends / holidays): Second Contact Pager: [phone]   Chief Complaint: chest pain  History of Present Illness:  Allen Espinoza is a 56yo male with PMH of DVT on chronic anticoagulation s/p IVC filter, HIV on ART recently restarted, and recurrent groin and axillar abscesses presenting initially to [facility] clinic with chest pain and recurrent abscesses.   Patient states his chest pain has been on/off for the past few weeks that is squeezing in quality. He notices it usually at night after dinner when he lies down, it's mainly on the left side of his chest, associated with numbness radiating to his left shoulder. He denies history of GERD. He does endorse waking up with a sour taste in his mouth. For his diet he prefers everything spicy if possible and drinks soda. He denies current coffee, alcohol or tobacco use but is a previous heavy drinker and smoker. He denies dysphagia, abdominal pain, nausea, vomiting, diarrhea, constipation, melena or hematochezia. He denies prior cardiac issues. He has noticed some increased swelling in his ankles. He endorses about a year history of shortness of breath with walking up a hill and up a flight of stairs that requires a few minutes to recover from. He denies chest pain with this, headaches, vision or hearing changes, numbness or tingling. Patient states his symptoms seemed to have become a little worse over time; he does endorse weight gain due to inactivity, and now anxiety when he starts getting short of breath that he thinks might be making his symptoms  worse. He denies orthopnea and cough.  Patient first started having problems with abscesses in his left axilla years ago while living in [place] that was finally controlled after 2wks of IV antibiotics at home. He started developing similar symptoms but now in his groin as well in September after moving to the [place] area and being off of his HIV medicines due to needing to change his insurance during the move. He was seen in the ED in September at which time he had a left thigh abscess drained and was placed on 2 week course of doxycycline per urology. U/S at the time revealed bil epididymoorchitis. In Oct he returned with returned symptoms after completion of antibiotic course. CT at the time didn't reveal deep abscess or Fournier's and he was started on 10 day course of Clindamycin. He was seen in [facility] in Dec and started on 7d Clindamycin and Fluconazole. Wound cultures grew Beta hemolytic strep group C&F with mixed skin flora. Again, about 1-2 days after completing the antibiotic course, his symptoms started developing again. He reports constant malodorous,  purulent and bloody drainage which has led him to stop taking his Xarelto in the last 2-3 days. He denies fevers or chills, dysuria, hematuria or urethral discharge.    Patient states compliance with his HIV meds; last CD4 count was 140 before restarting his ART, and was started on Atovaquone  for PCP prophylaxis due to his allergies.   Meds:  Current Meds  Medication Sig  . cholecalciferol (VITAMIN D) 1000 units tablet Take 1,000 Units by mouth daily.  . Darunavir Ethanolate (PREZISTA) 800 MG tablet Take 1 tablet (800 mg total) by mouth daily with breakfast.  . elvitegravir-cobicistat-emtricitabine-tenofovir (GENVOYA) 150-150-200-10 MG TABS tablet Take 1 tablet by mouth daily with breakfast.    Allergies: Allergies as of 04/26/2016 - Review Complete 04/26/2016  Allergen Reaction Noted  . Sulfa antibiotics Other (See Comments) 12/14/2015    Past Medical History:  Diagnosis Date  . Abscess   . Avascular necrosis of femoral head, left ([place])   . Cholelithiasis   . CKD (chronic kidney disease) stage 3, GFR 30-59 ml/min   . DVT (deep venous thrombosis) ([place])   . HIV (human immunodeficiency virus infection) ([place])   . Morbid obesity ([place])     Family History: Mother with multiple cardiac issues and procedures, died of MI at age 28 but unsure when procedures first began.  Social History: Patient endorses prior cocaine abuse, last was 26yr ago, and prior alcohol abuse. He denies tobacco use.  Review of Systems: A complete ROS was negative except as per HPI.   Physical Exam: Blood pressure (!) 149/69, pulse (!) 51, temperature 97.8 F (36.6 C), temperature source Oral, resp. rate 18, height 6' (1.829 m), weight 298 lb 9.6 oz (135.4 kg), SpO2 100 %. General: alert, well-developed, and cooperative to examination.  Head: normocephalic and atraumatic.  Eyes: vision grossly intact, pupils equal, pupils round, pupils reactive to light, no injection and anicteric.  Mouth: pharynx pink and moist, no erythema, and no exudates.  Neck: supple, full ROM, no thyromegaly, no JVD.  Lungs: normal respiratory effort, no accessory muscle use, normal breath sounds, no crackles, and no wheezes. Heart: normal rate, regular rhythm, no murmur, no gallop, and no rub.  Abdomen: soft, non-tender, normal bowel sounds, no distention, no guarding, no rebound tenderness.  Msk: no joint swelling, no joint warmth, and no redness over joints.  Pulses: 2+ DP/PT pulses bilaterally Extremities: No cyanosis, clubbing. 1-2+ pitting edema in bil LE, with no palpable chords and no calf tenderness Neurologic: alert & oriented X3, cranial nerves II-XII intact, strength normal in all extremities, sensation intact to light touch.  Skin: L Axilla: scarring with loss of pores, palpable 1.1cm mass vs lymph node that is tender. No active drainage GU: hyperpigmentation and  induration on bil inner thighs with peripheral papular rash; hyperpigmentation of right scrotum with open draining site and tenderness, no fluctuance noted.  Psych: Oriented X3, memory intact for recent and remote, normally interactive, good eye contact, not anxious appearing, and not depressed appearing  Labs: Na 140, K 4.2, Cl 108, CO2 26, BUN 16, Cr 1.65, Glu 110 WBC 8.1, Hgb 12.6, Hct 38.2, Plts 247 Trop neg  EKG: Personally reviewed - normal sinus rhythm   CT abd/pel 01/29/2016: Avascular necrosis in left femoral head. Rounded atelectasis that could represent underlying nodule - rec 26mo f/u CT chest  Assessment & Plan by Problem: Active Problems:   Scrotal abscess  Multiple abscesses, intertrigo: Immunocompromised patient with recurrent abscesses in his axilla and groin after appropriate antibiotic outpatient treatment. Thigh skin findings consistent with candidal intertrigo. --IV vanc/cefepime --PO fluconazole 100mg  daily --U/S scrotum --scrotal and perineal wound cultures --BCx, GC/Cl  Chest Pain: Patient with noncardiac symptoms, likely 2/2 GERD as occurs after dinner when lying down, diet includes lots of spicy foods and soda. Initial troponin was  negative and EKG did not show ischemic changes. He is a former smoker, with no known cardiac disease. --telemetry --pantoprazole 40mg  daily --trend troponins --f/u AM EKG  Shortness of breath: Patient with 1 year history of progressive shortness of breath with exertion (progression of symptoms vs anxiety), without orthopnea, cough or chest pain associated with it. He does have progression of LE swelling that is also chronic in the past few years. He endorses steady weight gain. Differential includes deconditioning, airway disease (former smoker), pos. pulmonary nodule incidentally found on CT abd, pulmonary edema, atypical infection in setting of immunocompromise. --f/u CXR 2 view --f/u Echo --low dose CT chest to f/u possible  nodule  HIV: Patient compliant with ART since restarting last month. Last CD4 was 140.  --continue Prezista and Genvoya --continue atovaquone for PCP prophylaxis --f/u CD4 and viral loads  H/o DVT on chronic anticoagulation and s/p IVC filter in 2014: Patient with h/o bil LE DVT's on chronic Xarelto therapy which he has held the last 2-3 days. He endorses increased LE swelling which is how he first presented with DVT's. Exam does not reveal palpable chords, swelling is equal bilaterally without erythema or increased warmth.  --Xarelto  Diet: HH IVF: none DVT: Xarelto Code: FULL  Dispo: Admit patient to Observation with expected length of stay less than 2 midnights.  Signed: Alphonzo Grieve, MD 04/26/2016, 10:59 PM  Pager [phone]

## 2016-04-26 NOTE — Progress Notes (Signed)
Pharmacy Antibiotic Note  BRITTNEY REMUND is a 56 y.o. male with PMH HIV (last CD4 count 140 - 03/2016) admitted on 04/26/2016 with scrotal/ perineal abscesses. Patient has history of recurrent skin infections of scrotum/L axilla. Pharmacy has been consulted for vancomycin/cefepime dosing. Afebrile, WBC wnl, SCr 1.65 on admit (1.38 on 03/18/16), normalized CrCl~50.   Plan: Vancomycin 2500mg  IV x1; then 1500mg  IV q24h Cefepime 2g IV q8h Monitor clinical progress, c/s, renal function, abx plan/LOT Vancomycin trough as indicated   Height: 6' (182.9 cm) Weight: 298 lb 9.6 oz (135.4 kg) IBW/kg (Calculated) : 77.6  Temp (24hrs), Avg:98.2 F (36.8 C), Min:97.8 F (36.6 C), Max:98.4 F (36.9 C)   Recent Labs Lab 04/26/16 1645  WBC 8.1  CREATININE 1.65*    Estimated Creatinine Clearance: 70.4 mL/min (by C-G formula based on SCr of 1.65 mg/dL (H)).    Allergies  Allergen Reactions  . Sulfa Antibiotics Other (See Comments)    Low blood pressure    Elicia Lamp, PharmD, BCPS Clinical Pharmacist 04/26/2016 10:22 PM

## 2016-04-26 NOTE — Progress Notes (Signed)
Wound culture performed with charge nurse assistance. Culture of open wound on scrotum sent to lab along with urine analysis.

## 2016-04-26 NOTE — Assessment & Plan Note (Addendum)
Allen Espinoza reports the drainage and pain subsided until he finished the Clindamycin and Fluconazole but then recurred as it has done so previously. Wound cultures drew beta hemolytic strep, group C and F and mixed skin flora. Repeat scrotal ultrasound/dopper in December revealed scrotal wall edema, no abscess, normal blood flow. He has now failed three courses of outpatient antibiotics and is experience persistent drainage and pain from the skin of his scrotum, perineum, medial thighs, and left axilla. Will admit patient.  Plan: - Admit for IV antibiotics - patient is allergic to sulfa antibiotics and immunocompromised with HIV and last CD4 count 140 - Follow blood and wound cultures - Consider ID consult given recurrent nature of this infection and immunocompromise - Wound care consult - Consider repeat scrotal ultrasound to assess for abscess, although it has been negative in December and September - Checked CBC with diff and BMP

## 2016-04-26 NOTE — Progress Notes (Signed)
Report called to Janett Billow on [address].  IV started in left forearm 22 Gauge .  Remains intact.  Patient alert and oriented. Transferred via wheelchair to [address].  Sander Nephew, RN 1/26/208 6:17 PM

## 2016-04-27 ENCOUNTER — Ambulatory Visit (HOSPITAL_COMMUNITY)
Admission: AD | Admit: 2016-04-27 | Discharge: 2016-04-27 | Disposition: A | Discharge status: Home / Self Care | Payer: Medicaid Other | Source: Ambulatory Visit | Attending: Pediatrics | Admitting: Pediatrics

## 2016-04-27 ENCOUNTER — Inpatient Hospital Stay (HOSPITAL_COMMUNITY): Payer: Medicaid Other

## 2016-04-27 DIAGNOSIS — Z86718 Personal history of other venous thrombosis and embolism: Secondary | ICD-10-CM

## 2016-04-27 DIAGNOSIS — Z79899 Other long term (current) drug therapy: Secondary | ICD-10-CM

## 2016-04-27 DIAGNOSIS — Z882 Allergy status to sulfonamides status: Secondary | ICD-10-CM

## 2016-04-27 DIAGNOSIS — L732 Hidradenitis suppurativa: Secondary | ICD-10-CM

## 2016-04-27 DIAGNOSIS — Z7901 Long term (current) use of anticoagulants: Secondary | ICD-10-CM

## 2016-04-27 DIAGNOSIS — Z881 Allergy status to other antibiotic agents status: Secondary | ICD-10-CM

## 2016-04-27 DIAGNOSIS — R0789 Other chest pain: Secondary | ICD-10-CM

## 2016-04-27 DIAGNOSIS — Z21 Asymptomatic human immunodeficiency virus [HIV] infection status: Secondary | ICD-10-CM

## 2016-04-27 DIAGNOSIS — R079 Chest pain, unspecified: Secondary | ICD-10-CM

## 2016-04-27 LAB — ECHOCARDIOGRAM COMPLETE
CHL CUP MV DEC (S): 227
E decel time: 227 msec
E/e' ratio: 8.77
FS: 26 % — AB (ref 28–44)
HEIGHTINCHES: 72 in
IV/PV OW: 1.01
LA ID, A-P, ES: 43 mm
LA diam end sys: 43 mm
LA diam index: 1.6 cm/m2
LA vol: 62.9 mL
LAVOLA4C: 58.9 mL
LAVOLIN: 23.4 mL/m2
LDCA: 3.46 cm2
LV E/e' medial: 8.77
LV E/e'average: 8.77
LV PW d: 11.8 mm — AB (ref 0.6–1.1)
LV TDI E'LATERAL: 12.2
LV TDI E'MEDIAL: 6.47
LVELAT: 12.2 cm/s
LVOT SV: 101 mL
LVOT VTI: 29.1 cm
LVOT diameter: 21 mm
LVOT peak grad rest: 8 mmHg
LVOT peak vel: 138 cm/s
MV pk A vel: 97 m/s
MV pk E vel: 107 m/s
MVPG: 5 mmHg
RV LATERAL S' VELOCITY: 10.4 cm/s
TAPSE: 31.8 mm
WEIGHTICAEL: 4801.6 [oz_av]

## 2016-04-27 LAB — URINALYSIS, ROUTINE W REFLEX MICROSCOPIC
BILIRUBIN URINE: NEGATIVE
GLUCOSE, UA: NEGATIVE mg/dL
Hgb urine dipstick: NEGATIVE
KETONES UR: NEGATIVE mg/dL
Leukocytes, UA: NEGATIVE
Nitrite: NEGATIVE
Protein, ur: NEGATIVE mg/dL
Specific Gravity, Urine: 1.019 (ref 1.005–1.030)
pH: 5 (ref 5.0–8.0)

## 2016-04-27 LAB — TROPONIN I
Troponin I: <0.03 ng/mL (ref <0.03)
Troponin I: <0.03 ng/mL (ref <0.03)

## 2016-04-27 IMAGING — CR DG CHEST 2V
2 series · 2 of 2 positions shown · non-contrast
Comparison: Chest radiograph dated [DATE]

CLINICAL DATA: 55-year-old male with chest pain.

EXAM:
CHEST  2 VIEW

[chest pa]
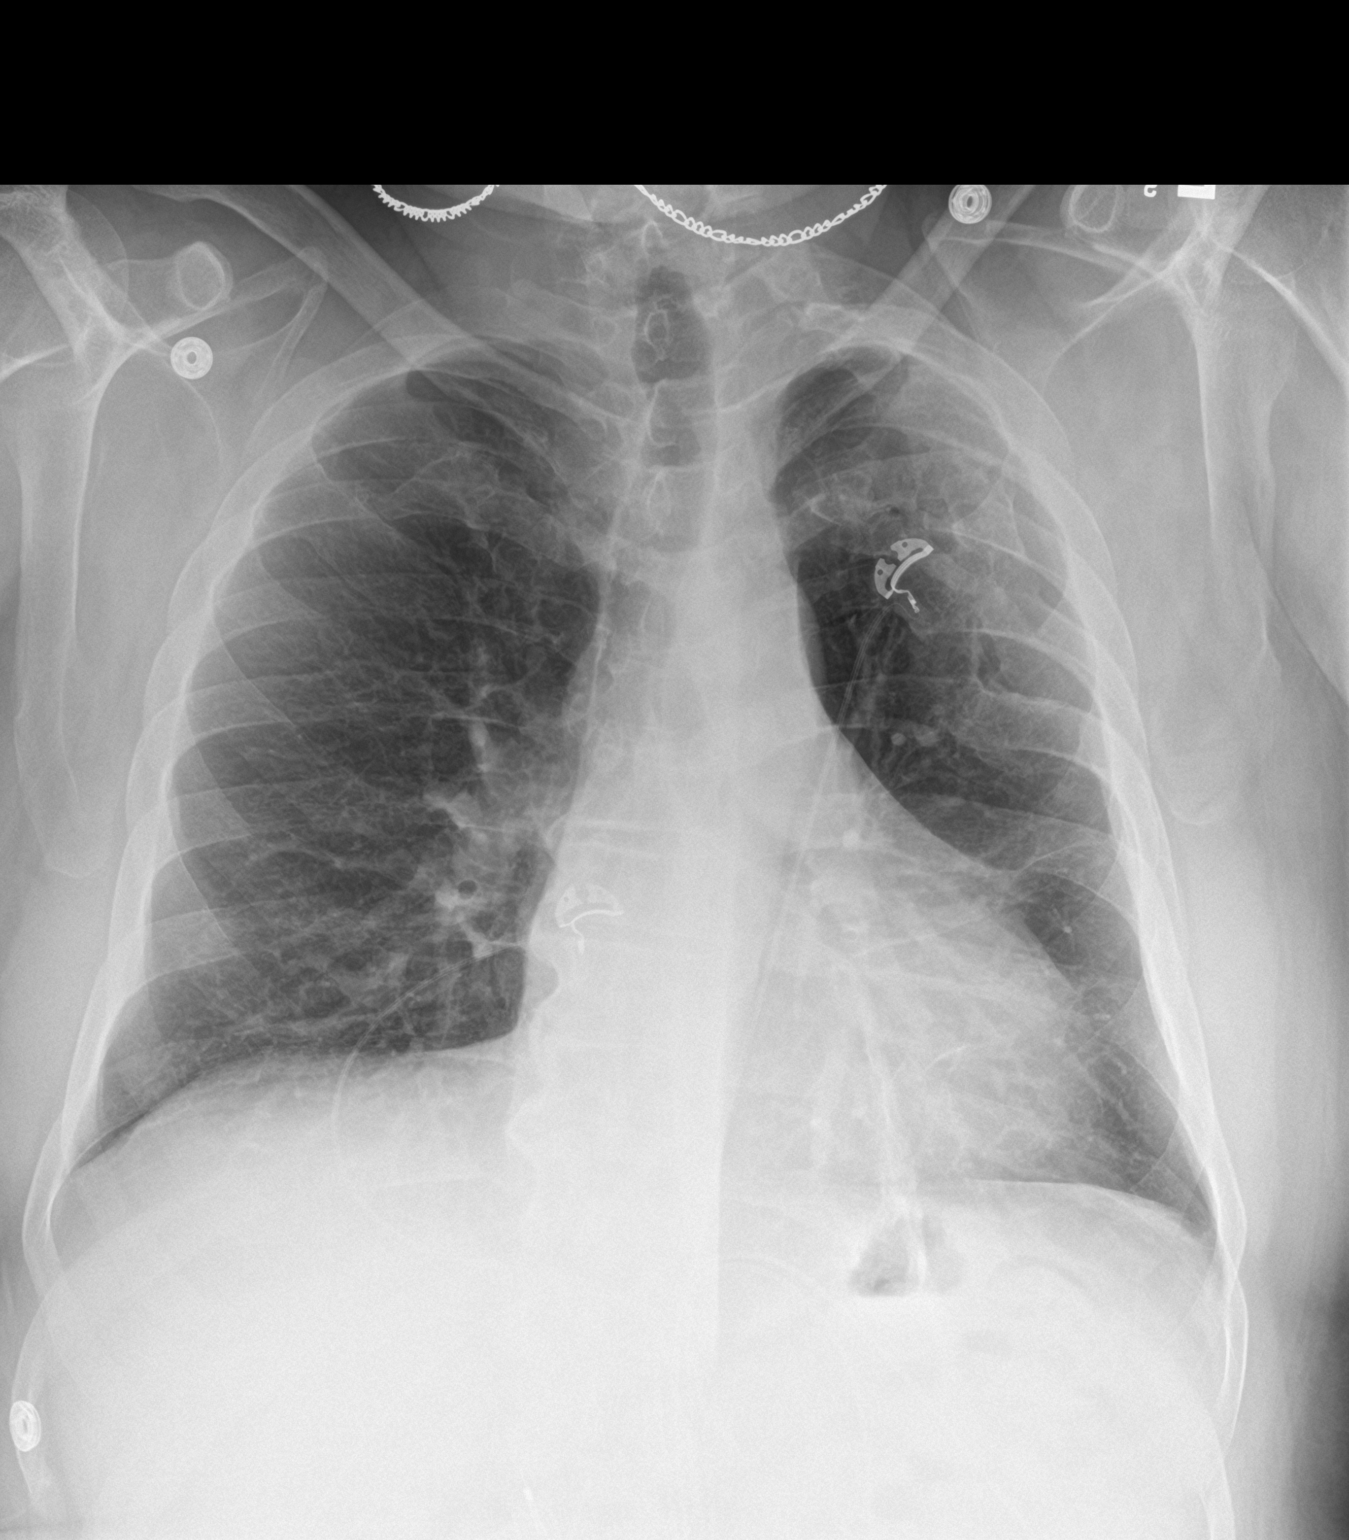

[chest lat]
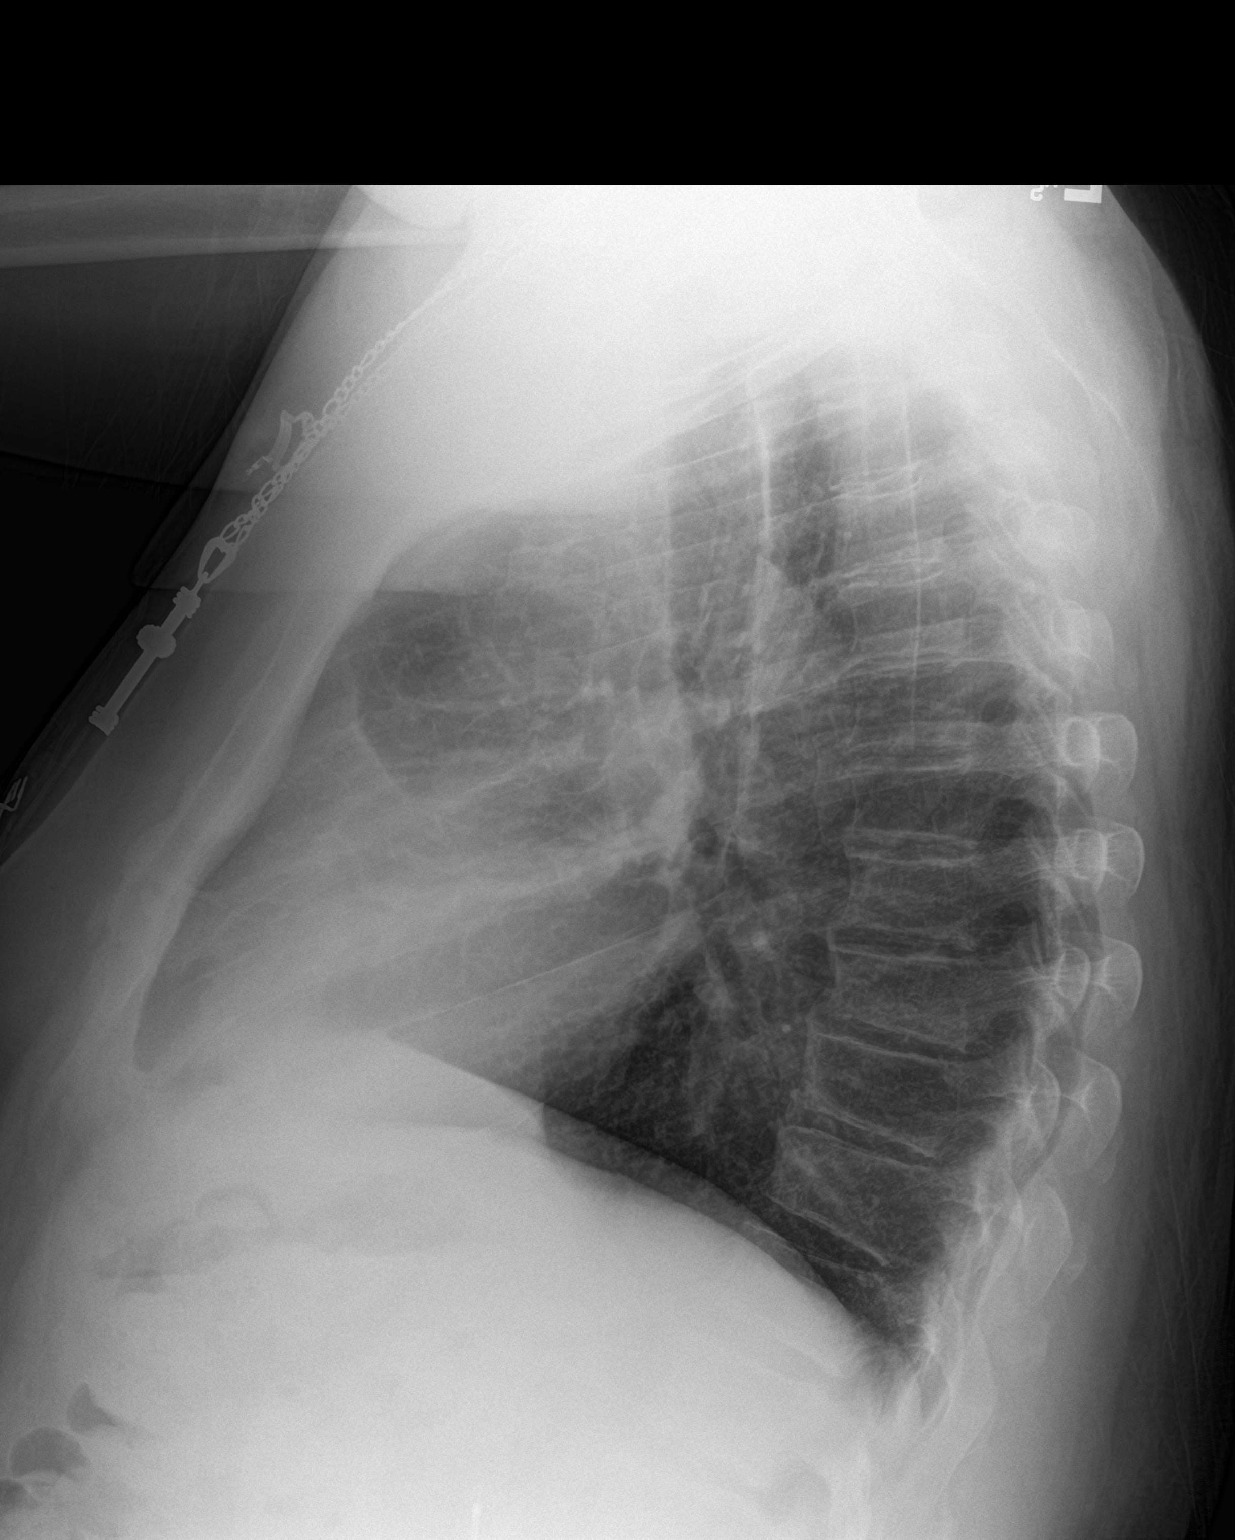

[2 of 2 positions shown; findings below may reference images not displayed]

FINDINGS: Two-views of the chest demonstrate clear lungs. There is no pleural
effusion or pneumothorax. Stable top-normal cardiac size. Old healed
left rib fracture deformities. No acute osseous pathology.
IMPRESSION: No active cardiopulmonary disease.

## 2016-04-27 MED ORDER — DOXYCYCLINE HYCLATE 100 MG PO TABS
100.0000 mg | ORAL_TABLET | Freq: Two times a day (BID) | ORAL | Status: DC
  Administered 2016-04-27: 100 mg via ORAL
  Filled 2016-04-27: qty 1

## 2016-04-27 MED ORDER — DOXYCYCLINE HYCLATE 100 MG PO TABS: 100.0000 mg | ORAL_TABLET | Freq: Two times a day (BID) | ORAL | 0 refills | Status: DC

## 2016-04-27 MED ORDER — ATOVAQUONE 750 MG/5ML PO SUSP: 1500.0000 mg | Freq: Every day | ORAL | 0 refills | Status: DC

## 2016-04-27 MED ORDER — DIPHENHYDRAMINE HCL 25 MG PO CAPS
25.0000 mg | ORAL_CAPSULE | Freq: Four times a day (QID) | ORAL | Status: DC | PRN
  Administered 2016-04-27: 25 mg via ORAL
  Filled 2016-04-27: qty 1

## 2016-04-27 NOTE — Discharge Summary (Signed)
Name: Allen Espinoza MRN: [id number] DOB: 1960-04-28 56 y.o. PCP: Asencion Partridge, MD  Date of Admission: 04/26/2016  6:29 PM Date of Discharge: 04/27/2016 Attending Physician: No att. providers found  Discharge Diagnosis:  Principal Problem:   Hidradenitis suppurativa Active Problems:   Human immunodeficiency virus (HIV) disease ([place])   DVT (deep venous thrombosis) ([place])   Chest pain   Discharge Medications: Allergies as of 04/27/2016      Reactions   Sulfa Antibiotics Other (See Comments)   Low blood pressure   Vancomycin Itching   Give with benadryl      Medication List    TAKE these medications   atovaquone 750 MG/5ML suspension Commonly known as:  MEPRON Take 10 mLs (1,500 mg total) by mouth daily with supper.   cholecalciferol 1000 units tablet Commonly known as:  VITAMIN D Take 1,000 Units by mouth daily.   Darunavir Ethanolate 800 MG tablet Commonly known as:  PREZISTA Take 1 tablet (800 mg total) by mouth daily with breakfast.   doxycycline 100 MG tablet Commonly known as:  VIBRA-TABS Take 1 tablet (100 mg total) by mouth every 12 (twelve) hours.   elvitegravir-cobicistat-emtricitabine-tenofovir 150-150-200-10 MG Tabs tablet Commonly known as:  GENVOYA Take 1 tablet by mouth daily with breakfast.   rivaroxaban 20 MG Tabs tablet Commonly known as:  XARELTO Take 1 tablet (20 mg total) by mouth every morning. What changed:  when to take this       Disposition and follow-up:   Allen Espinoza was discharged from [facility] in Good condition.  At the hospital follow up visit please address:  1.  Hidradenitis Suppurative.  Refer to dermatology, may benefit from referral to academic center.  Continue doxycycline for at least 2 months.  Advised to wear loose clothes, not to irritate affected areas, keep clean and dry, don't shower excessively.  Avoid I&Ds unless signs of systemic infection or other concers.  2.  Chest Pain.  Ask about  symptoms of ACS and CHF.  3.  HIV.  Ensure ID follow-up is scheduled.  4.  Depression screening.  High comorbidity with HS.  5. Pulmonary nodule.  Incidentally found on 12/2015 abdominal CT.  Repeat outpatient chest CT recommended.  6.  Labs / imaging needed at time of follow-up: none  7.  Pending labs/ test needing follow-up: none  Follow-up Appointments: Follow-up Information    [place]. Schedule an appointment as soon as possible for a visit in 2 week(s).   Why:  They will call you on [facility] for an appointment.  If you don't hear from them, please call to schedule. Contact information: [address] [place] Hospital Course by problem list: Principal Problem:   Hidradenitis suppurativa Active Problems:   Human immunodeficiency virus (HIV) disease ([place])   DVT (deep venous thrombosis) ([facility])   Chest pain   1. Chest Pain Allen Espinoza was admitted from clinic with several weeks of intermittent atypical chest pain, a "squeezing" discomfort in his left chest and arm mainly when lying on that side.  He has had gradually worsening dyspnea on exertion, leg edema, weight gain in the past year.  ACS was ruled out with serial undetectable troponins and nonischemic EKG, and his chest pain resolved.  Echocardiogram showed normal EF and mild diastolic dysfunction, not consistent with clinical heart failure.  2.  Hidradenitis Suppurativa Allen Espinoza has had chronic  draining abscesses and fibrotic scarring in his axillae and groin for many years consistent with HS.  He has had many short courses of antibiotics with temporary improvement, and a few I&Ds of abscesses.  Scrotal ultrasound showed no abscess at this admission.  He has never seen a dermatologist.  He was initiated on oral doxycycline which he should continue indefinitely until seen by a dermatologist.  Advised that this a chronic, hard to manage condition that usually  dose not represent acute bacterial infection.  3.  HIV Continued on home antiretrovirals, reports missing ID due to snow closing the clinic and new patient appointment now in February.  4. History of DVT Restarted on home Xarelto, which he had not taken in 2-3 days prior to admission due to sanguinous drainage from his wounds.  Discharge Vitals:   BP 131/71 (BP Location: Left Arm)   Pulse (!) 52   Temp 97.9 F (36.6 C) (Oral)   Resp 19   Ht 6' (1.829 m)   Wt (!) 300 lb 1.6 oz (136.1 kg)   SpO2 98%   BMI 40.70 kg/m   Pertinent Labs, Studies, and Procedures:   CBC Latest Ref Rng & Units 04/26/2016 01/29/2016 01/29/2016  WBC 4.0 - 10.5 K/uL 8.1 - 7.8  Hemoglobin 13.0 - 17.0 g/dL 12.6(L) 13.6 12.7(L)  Hematocrit 39.0 - 52.0 % 38.2(L) 40.0 37.2(L)  Platelets 150 - 400 K/uL 247 - 241   CMP Latest Ref Rng & Units 04/26/2016 03/18/2016 01/29/2016  Glucose 65 - 99 mg/dL 110(H) 98 100(H)  BUN 6 - 20 mg/dL 16 14 15   Creatinine 0.61 - 1.24 mg/dL 1.65(H) 1.38(H) 1.80(H)  Sodium 135 - 145 mmol/L 140 141 138  Potassium 3.5 - 5.1 mmol/L 4.2 4.3 4.5  Chloride 101 - 111 mmol/L 108 100 103  CO2 22 - 32 mmol/L 26 24 -  Calcium 8.9 - 10.3 mg/dL 9.0 9.3 -  Total Protein 6.5 - 8.1 g/dL - - -  Total Bilirubin 0.3 - 1.2 mg/dL - - -  Alkaline Phos 38 - 126 U/L - - -  AST 15 - 41 U/L - - -  ALT 17 - 63 U/L - - -   Lab Results  Component Value Date   HIV1RNAQUANT 30 04/26/2016   Lab Results  Component Value Date   CD4TCELL 6 (L) 03/18/2016   CD4TABS 140 (L) 03/18/2016   Echocardiogram 04/27/2016 EF 55-60%, G1DD  Blood Cultures 04/26/2016 1/2 growing coagulase negative staph on day 3, likely contaminant  Discharge Instructions: Discharge Instructions    Diet - low sodium heart healthy    Complete by:  As directed    Increase activity slowly    Complete by:  As directed      You were admitted to the hospital for chest pain and draining abscess.  We made sure that your chest pain  wasn't being caused by anything dangerous from your heart like a heart attack.  For the abscesses, I think you have a condition call Hidradenitis Suppuritiva.  This is a chronic condition, rather than being caused by a specific infection.  I have prescribed you an antibiotic doxycycline to take twice a day for at least a couple of months.  Doxycycline works as an anti-inflammatory in the skin in addition to as an antibiotic.  The goal it to decrease the drainage and discomfort in your armpits and groin, but it probably will not go away completely.  Try to keep the areas clean and dry, but you  do not need to shower multiple times per day.  It will be important to see a dermatologist in clinic to follow up with.  Signed: Minus Liberty, MD 04/27/2016, 5:40 PM   Pager: [phone]

## 2016-04-27 NOTE — Progress Notes (Addendum)
Sarah, case manager, notified of pt discharge needs for medications.

## 2016-04-27 NOTE — Progress Notes (Signed)
  Echocardiogram 2D Echocardiogram has been performed.  Allen Espinoza L Androw 04/27/2016, 1:45 PM

## 2016-04-27 NOTE — Progress Notes (Signed)
  Echocardiogram 2D Echocardiogram has been performed.  Allen Espinoza 04/27/2016, 1:44 PM

## 2016-04-27 NOTE — Care Management Note (Signed)
Case Management Note  Patient Details  Name: ADEAN BLOMME MRN: [name] Date of Birth: 02-14-1961  Subjective/Objective:  56 y.o. M who is a Medicaid beneficiary requesting assistance with medications as they do not receive their checks until the first of the month. CM instructed them that we are unable to assist with pts who are covered by Medicaid but if they make the Pharmacist aware they do not have copay Pharmacy cannot withold needed meds for lack of co-pay.  Pt appreciative.                 Action/Plan: Anticipate discharge home today. No further CM needs but will be available should additional discharge needs arise.   Expected Discharge Date:  04/27/16               Expected Discharge Plan:     In-House Referral:     Discharge planning Services  CM Consult, Medication Assistance  Post Acute Care Choice:    Choice offered to:   Celesta Gentile at bedside)  DME Arranged:    DME Agency:     HH Arranged:    [place] Agency:     Status of Service:  Completed, signed off  If discussed at [employer] of Stay Meetings, dates discussed:    Additional Comments:  Delrae Sawyers, RN 04/27/2016, 2:06 PM

## 2016-04-27 NOTE — Progress Notes (Addendum)
Pt complained of itching (head and upper torso) after receiving vancomycin. Medication was stopped and on call notified. Order for benadryl ordered. Added medication to the pts allergies and notified pharmacy. On call verified ok to restart vancomycin after benadryl is given. Will continue to monitor.

## 2016-04-27 NOTE — Discharge Instructions (Addendum)
You were admitted to the hospital for chest pain and draining abscess.  We made sure that your chest pain wasn't being caused by anything dangerous from your heart like a heart attack.  For the abscesses, I think you have a condition call Hidradenitis Suppuritiva.  This is a chronic condition, rather than being caused by a specific infection.  I have prescribed you an antibiotic doxycycline to take twice a day for at least a couple of months.  Doxycycline works as an anti-inflammatory in the skin in addition to as an antibiotic.  The goal it to decrease the drainage and discomfort in your armpits and groin, but it probably will not go away completely.  Try to keep the areas clean and dry, but you do not need to shower multiple times per day.  It will be important to see a dermatologist in clinic to follow up with.  Information on my medicine - XARELTO (Rivaroxaban)  This medication education was reviewed with me or my healthcare representative as part of my discharge preparation.  The pharmacist that spoke with me during my hospital stay was:  Einar Grad, [facility]  Why was Xarelto prescribed for you? Xarelto was prescribed for you to reduce the risk of a blood clot forming that can cause a stroke if you have a medical condition called atrial fibrillation (a type of irregular heartbeat).  What do you need to know about xarelto ? Take your Xarelto ONCE DAILY at the same time every day with your evening meal. If you have difficulty swallowing the tablet whole, you may crush it and mix in applesauce just prior to taking your dose.  Take Xarelto exactly as prescribed by your doctor and DO NOT stop taking Xarelto without talking to the doctor who prescribed the medication.  Stopping without other stroke prevention medication to take the place of Xarelto may increase your risk of developing a clot that causes a stroke.  Refill your prescription before you run out.  After discharge, you should  have regular check-up appointments with your healthcare provider that is prescribing your Xarelto.  In the future your dose may need to be changed if your kidney function or weight changes by a significant amount.  What do you do if you miss a dose? If you are taking Xarelto ONCE DAILY and you miss a dose, take it as soon as you remember on the same day then continue your regularly scheduled once daily regimen the next day. Do not take two doses of Xarelto at the same time or on the same day.   Important Safety Information A possible side effect of Xarelto is bleeding. You should call your healthcare provider right away if you experience any of the following: ? Bleeding from an injury or your nose that does not stop. ? Unusual colored urine (red or dark brown) or unusual colored stools (red or black). ? Unusual bruising for unknown reasons. ? A serious fall or if you hit your head (even if there is no bleeding).  Some medicines may interact with Xarelto and might increase your risk of bleeding while on Xarelto. To help avoid this, consult your healthcare provider or pharmacist prior to using any new prescription or non-prescription medications, including herbals, vitamins, non-steroidal anti-inflammatory drugs (NSAIDs) and supplements.  This website has more information on Xarelto: [email or web address]

## 2016-04-27 NOTE — Progress Notes (Signed)
Subjective: Feels well, no chest pain or dyspnea.  His pain is mostly left sided chest and arm pain when lying on his left side.  Objective:  Vital signs in last 24 hours: Vitals:   04/26/16 1840 04/26/16 1949 04/27/16 0534  BP: 138/78 (!) 149/69 139/68  Pulse: (!) 53 (!) 51 (!) 52  Resp:  18 18  Temp: 98.4 F (36.9 C) 97.8 F (36.6 C) 97.5 F (36.4 C)  TempSrc: Oral Oral Oral  SpO2: 98% 100% 100%  Weight: 298 lb 9.6 oz (135.4 kg)  (!) 300 lb 1.6 oz (136.1 kg)  Height: 6' (1.829 m)     Physical Exam  Constitutional: He is oriented to person, place, and time. He appears well-developed and well-nourished. No distress.  Cardiovascular: Normal rate and regular rhythm.   Pulmonary/Chest: Effort normal and breath sounds normal.  Neurological: He is alert and oriented to person, place, and time.  Skin:  Axillae and groin with diffuse hyperpigmentation and fibrotic scarring L axilla with 2 draining sinuses R axilla with scar but no active drainage Scrotum with skin thickening and no fluctuance Bilateral inguinal creases and panus with draining sinuses  Psychiatric: He has a normal mood and affect. His behavior is normal.   CBC Latest Ref Rng & Units 04/26/2016 01/29/2016 01/29/2016  WBC 4.0 - 10.5 K/uL 8.1 - 7.8  Hemoglobin 13.0 - 17.0 g/dL 12.6(L) 13.6 12.7(L)  Hematocrit 39.0 - 52.0 % 38.2(L) 40.0 37.2(L)  Platelets 150 - 400 K/uL 247 - 241   CMP Latest Ref Rng & Units 04/26/2016 03/18/2016 01/29/2016  Glucose 65 - 99 mg/dL 110(H) 98 100(H)  BUN 6 - 20 mg/dL 16 14 15   Creatinine 0.61 - 1.24 mg/dL 1.65(H) 1.38(H) 1.80(H)  Sodium 135 - 145 mmol/L 140 141 138  Potassium 3.5 - 5.1 mmol/L 4.2 4.3 4.5  Chloride 101 - 111 mmol/L 108 100 103  CO2 22 - 32 mmol/L 26 24 -  Calcium 8.9 - 10.3 mg/dL 9.0 9.3 -  Total Protein 6.5 - 8.1 g/dL - - -  Total Bilirubin 0.3 - 1.2 mg/dL - - -  Alkaline Phos 38 - 126 U/L - - -  AST 15 - 41 U/L - - -  ALT 17 - 63 U/L - - -   Cardiac Panel  (last 3 results)  Recent Labs  04/26/16 2256 04/27/16 0346 04/27/16 1030  TROPONINI <0.03 <0.03 <0.03    Ultrasound Scrotum 04/26/2016 IMPRESSION: No testicular torsion or focal testicular mass. Scrotal skin thickening consistent with edema with small complex hydrocele measuring 3.9 x 1.3 x 2.2 cm.  Assessment/Plan:  Principal Problem:   Hidradenitis suppurativa Active Problems:   Scrotal abscess  56 y.o. male with atypical chest pain and chronic draining abscesses of axillae and groin consistent with hidradenitis suppurativa.  ACS ruled out with normal EKG and undetectable troponins.  No signs/symptomc of systemic infection and no scrotal abscess on ultrasound.  His draining sinus tracts seem to be a chronic problem that would best be managed with chronic tetracyclines   #Hidradenitis Suppurativa -D/c IV antibiotics -Doxycycline 100 mg BID indefinitely -Keep skin folds clean and dry -Outpatient dermatology follow-up  #Chest Pain Resolved.  Likely MSK with positional features  #HIV Back on antiretrovirals after 1-2 month lapse while moving.  No signs of opportunistic infection. -Continue home meds -Outpatient ID follow-up  #History of DVT -Continue home Xarelto  #Pulmonary Nodule -outpatient f/u chest CT  Fluids: none Diet: heart DVT Prophylaxis: Xarelto Code Status: full  Dispo: Anticipated discharge today.   Minus Liberty, MD 04/27/2016, 6:49 AM Pager: [phone]

## 2016-04-27 NOTE — Progress Notes (Addendum)
MD notified of pt's discharge concerns. MD to bedside to discuss discharge plan with pt and pt's fiance.

## 2016-04-28 LAB — HIV-1 RNA QUANT-NO REFLEX-BLD
HIV 1 RNA Quant: 30 copies/mL
LOG10 HIV-1 RNA: 1.477 {Log_copies}/mL

## 2016-04-28 LAB — HEMOGLOBIN A1C
Hgb A1c MFr Bld: 5 % (ref 4.8–5.6)
MEAN PLASMA GLUCOSE: 97 mg/dL

## 2016-04-29 ENCOUNTER — Telehealth: Payer: Self-pay

## 2016-04-29 LAB — BLOOD CULTURE ID PANEL (REFLEXED)
Acinetobacter baumannii: NOT DETECTED
CANDIDA ALBICANS: NOT DETECTED
CANDIDA GLABRATA: NOT DETECTED
CANDIDA KRUSEI: NOT DETECTED
Candida parapsilosis: NOT DETECTED
Candida tropicalis: NOT DETECTED
ENTEROBACTER CLOACAE COMPLEX: NOT DETECTED
ENTEROBACTERIACEAE SPECIES: NOT DETECTED
ENTEROCOCCUS SPECIES: NOT DETECTED
ESCHERICHIA COLI: NOT DETECTED
Haemophilus influenzae: NOT DETECTED
KLEBSIELLA PNEUMONIAE: NOT DETECTED
Klebsiella oxytoca: NOT DETECTED
LISTERIA MONOCYTOGENES: NOT DETECTED
Methicillin resistance: NOT DETECTED
Neisseria meningitidis: NOT DETECTED
Proteus species: NOT DETECTED
Pseudomonas aeruginosa: NOT DETECTED
STREPTOCOCCUS AGALACTIAE: NOT DETECTED
STREPTOCOCCUS PNEUMONIAE: NOT DETECTED
STREPTOCOCCUS PYOGENES: NOT DETECTED
STREPTOCOCCUS SPECIES: NOT DETECTED
Serratia marcescens: NOT DETECTED
Staphylococcus aureus (BCID): NOT DETECTED
Staphylococcus species: DETECTED — AB

## 2016-04-29 LAB — T-HELPER CELLS (CD4) COUNT (NOT AT ARMC)
CD4 T CELL ABS: 200 /uL — AB (ref 400–2700)
CD4 T CELL HELPER: 9 % — AB (ref 33–55)

## 2016-04-29 NOTE — Progress Notes (Signed)
Internal Medicine Clinic Attending  I saw and evaluated the patient.  I personally confirmed the key portions of the history and exam documented by Dr. Johnson and I reviewed pertinent patient test results.  The assessment, diagnosis, and plan were formulated together and I agree with the documentation in the resident's note.  

## 2016-04-29 NOTE — Telephone Encounter (Signed)
Hospital TOC. 

## 2016-04-30 LAB — GC/CHLAMYDIA PROBE AMP (~~LOC~~) NOT AT ARMC
CHLAMYDIA, DNA PROBE: NEGATIVE
NEISSERIA GONORRHEA: NEGATIVE

## 2016-05-01 LAB — CULTURE, BLOOD (ROUTINE X 2)

## 2016-05-02 LAB — AEROBIC/ANAEROBIC CULTURE W GRAM STAIN (SURGICAL/DEEP WOUND)

## 2016-05-02 LAB — AEROBIC/ANAEROBIC CULTURE (SURGICAL/DEEP WOUND)

## 2016-05-02 LAB — CULTURE, BLOOD (ROUTINE X 2): CULTURE: NO GROWTH

## 2016-05-02 NOTE — Telephone Encounter (Signed)
Lm for rtc 

## 2016-05-03 ENCOUNTER — Telehealth: Payer: Self-pay | Admitting: Internal Medicine

## 2016-05-03 NOTE — Telephone Encounter (Signed)
APT. REMINDER CALL, LMTCB °

## 2016-05-03 NOTE — Telephone Encounter (Signed)
Transition Care Management Follow-up Telephone Call   Date discharged? 04/27/16   How have you been since you were released from the hospital? "Fine"   Do you understand why you were in the hospital yes   Do you understand the discharge instructions? yes   Where were you discharged to? Home   Items Reviewed:  Medications reviewed: yes  Allergies reviewed: yes  Dietary changes reviewed: n/a  Referral reviewed: n/a   Functional Questionnaire:   Activities of Daily Living (ADLs):   He states they are independent in the following: all ADL's States they require assistance with the following:   Any transportation issues/concerns?: no  Any patient concerns? "No"  Confirmed importance and date/time of follow-up visits scheduled yes  Provider Appointment booked with  [facility] on Monday 2/5 Confirmed with patient if condition begins to worsen call PCP or go to the ER.  Patient was given the office number and encouraged to call back with question or concerns.  : yes

## 2016-05-06 ENCOUNTER — Ambulatory Visit (INDEPENDENT_AMBULATORY_CARE_PROVIDER_SITE_OTHER): Payer: Medicaid Other | Admitting: Internal Medicine

## 2016-05-06 VITALS — BP 144/69 | HR 55 | Temp 97.9°F | Ht 72.0 in | Wt 304.2 lb

## 2016-05-06 DIAGNOSIS — Z7901 Long term (current) use of anticoagulants: Secondary | ICD-10-CM

## 2016-05-06 DIAGNOSIS — M87052 Idiopathic aseptic necrosis of left femur: Secondary | ICD-10-CM

## 2016-05-06 DIAGNOSIS — Z79899 Other long term (current) drug therapy: Secondary | ICD-10-CM

## 2016-05-06 DIAGNOSIS — L732 Hidradenitis suppurativa: Secondary | ICD-10-CM | POA: Diagnosis not present

## 2016-05-06 DIAGNOSIS — Z86718 Personal history of other venous thrombosis and embolism: Secondary | ICD-10-CM

## 2016-05-06 DIAGNOSIS — Z21 Asymptomatic human immunodeficiency virus [HIV] infection status: Secondary | ICD-10-CM

## 2016-05-06 DIAGNOSIS — R079 Chest pain, unspecified: Secondary | ICD-10-CM

## 2016-05-06 DIAGNOSIS — Z5189 Encounter for other specified aftercare: Secondary | ICD-10-CM | POA: Diagnosis present

## 2016-05-06 DIAGNOSIS — R911 Solitary pulmonary nodule: Secondary | ICD-10-CM

## 2016-05-06 DIAGNOSIS — B20 Human immunodeficiency virus [HIV] disease: Secondary | ICD-10-CM

## 2016-05-06 IMAGING — US US ART/VEN ABD/PELV/SCROTUM DOPPLER LTD
1 series · 13 of 25 positions shown · non-contrast
Comparison: None.

CLINICAL DATA: Right scrotal pain and swelling x2 months. Drainage
of right scrotal boil 6 months ago.

EXAM:
SCROTAL ULTRASOUND
DOPPLER ULTRASOUND OF THE TESTICLES
TECHNIQUE: Complete ultrasound examination of the testicles, epididymis, and
other scrotal structures was performed. Color and spectral Doppler
ultrasound were also utilized to evaluate blood flow to the
testicles.

[Series 1: us art/ven abd/pelv/scrotum doppler ltd · 0.06mm/px · 79 acquisitions, 13 frames shown]
[im 1/79]
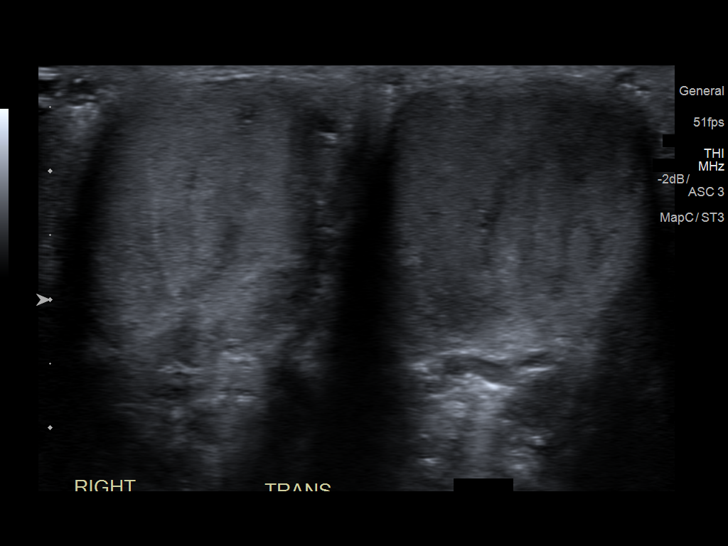
[im 7/79]
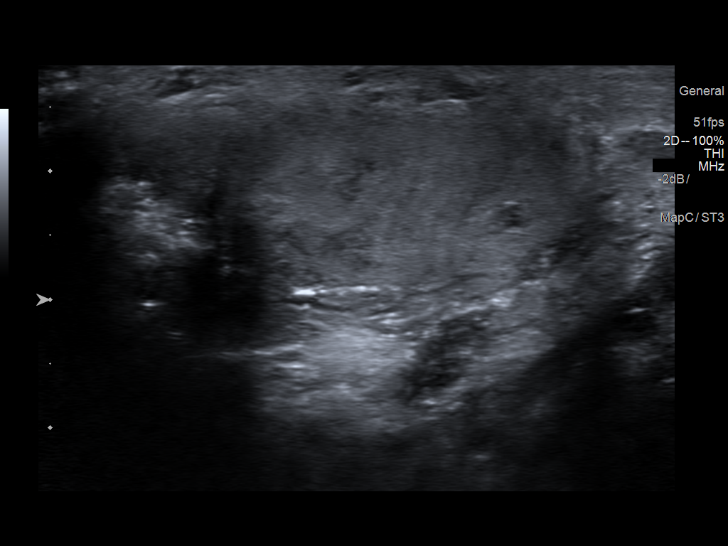
[im 14/79]
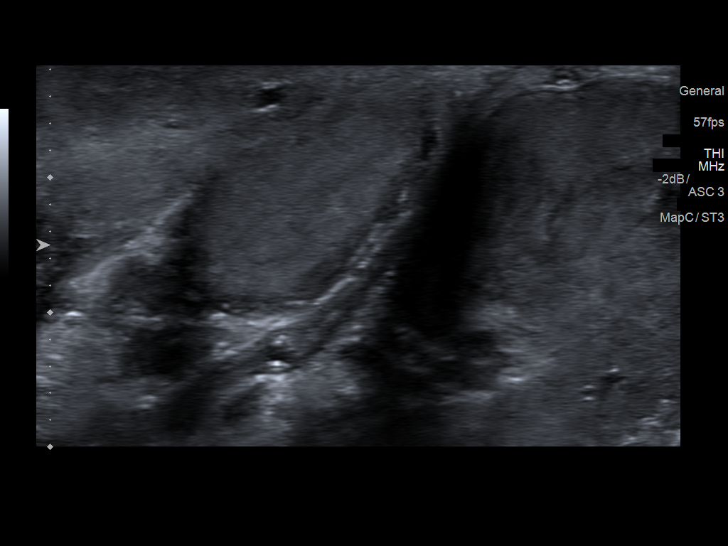
[im 20/79]
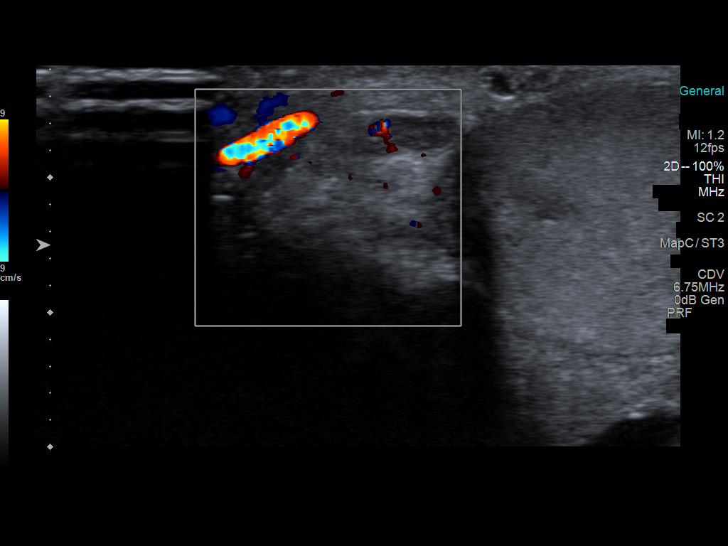
[im 27/79]
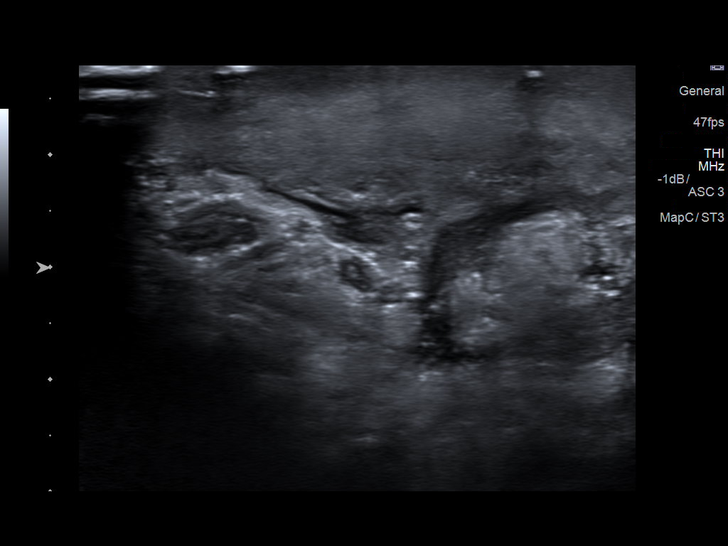
[im 33/79]
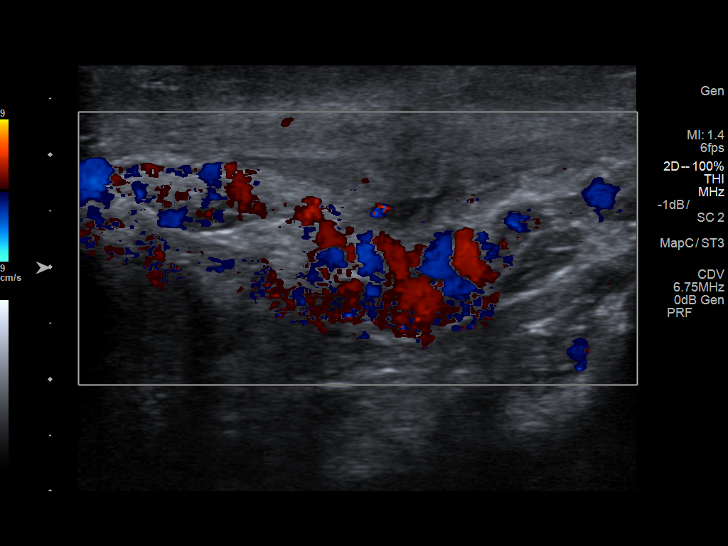
[im 40/79]
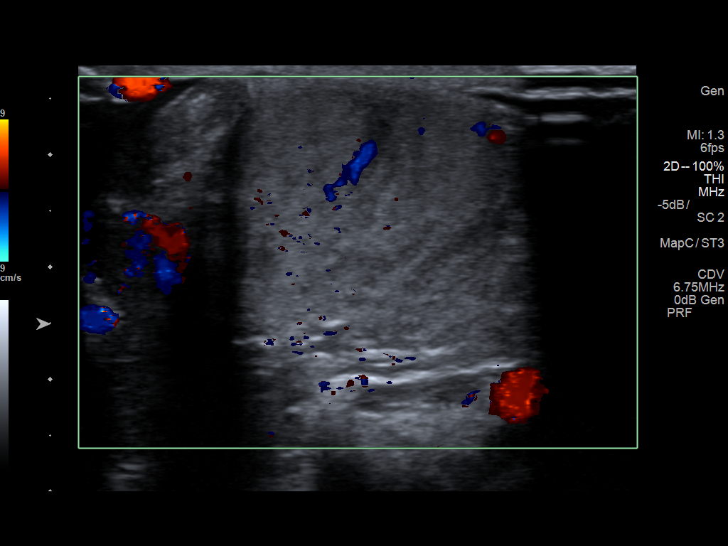
[im 46/79]
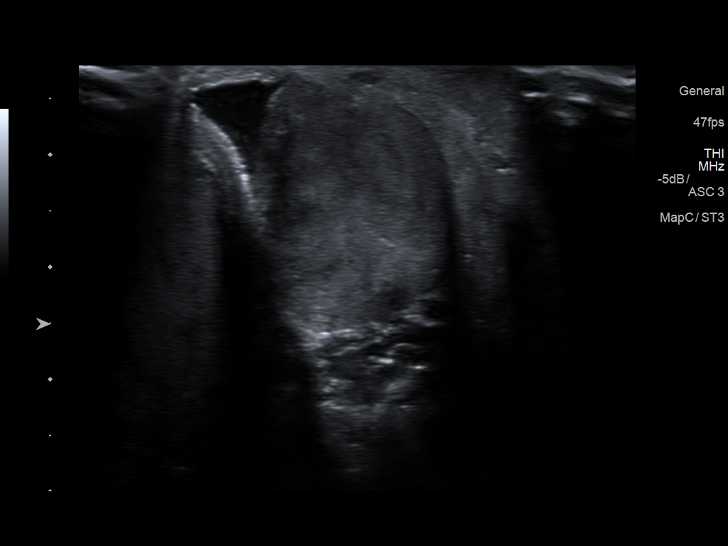
[im 53/79]
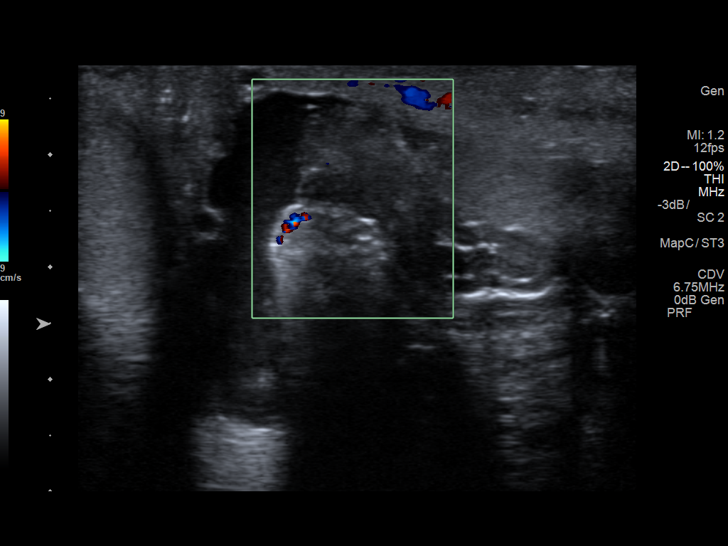
[im 59/79]
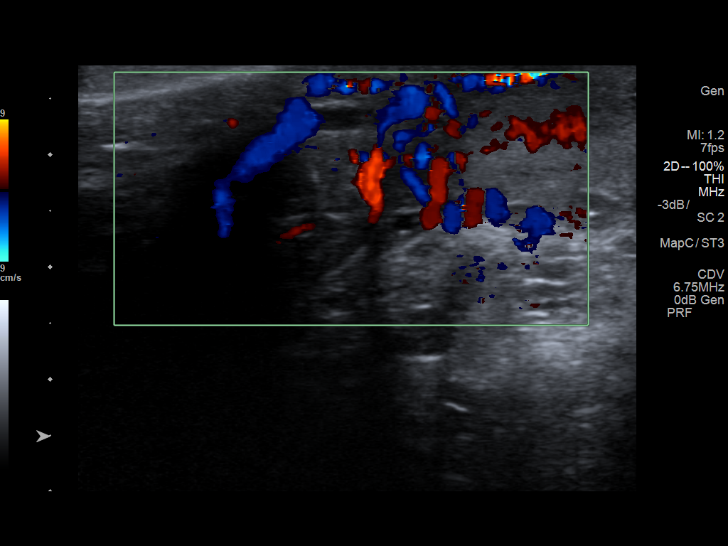
[im 66/79]
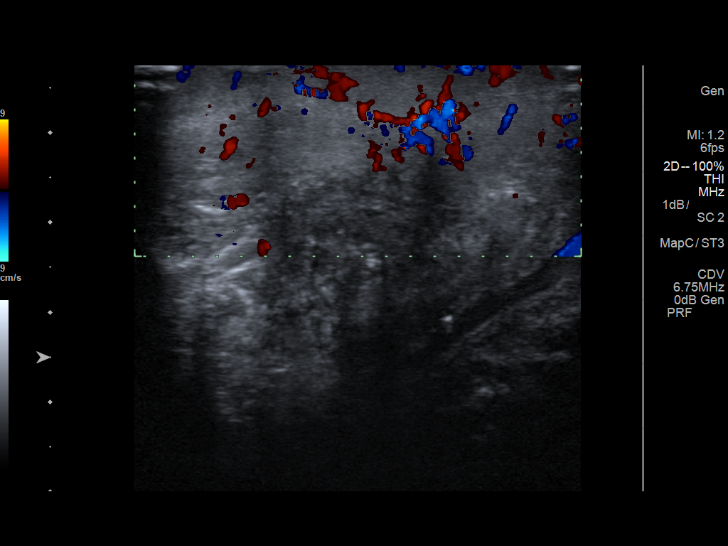
[im 72/79]
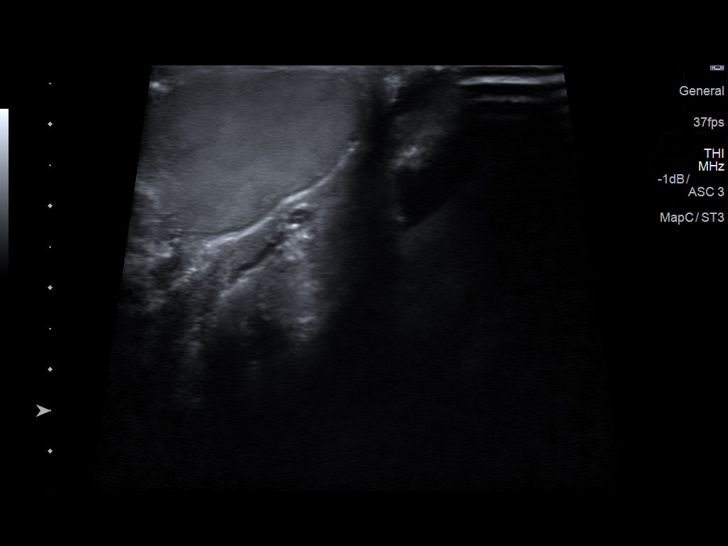
[im 79/79]
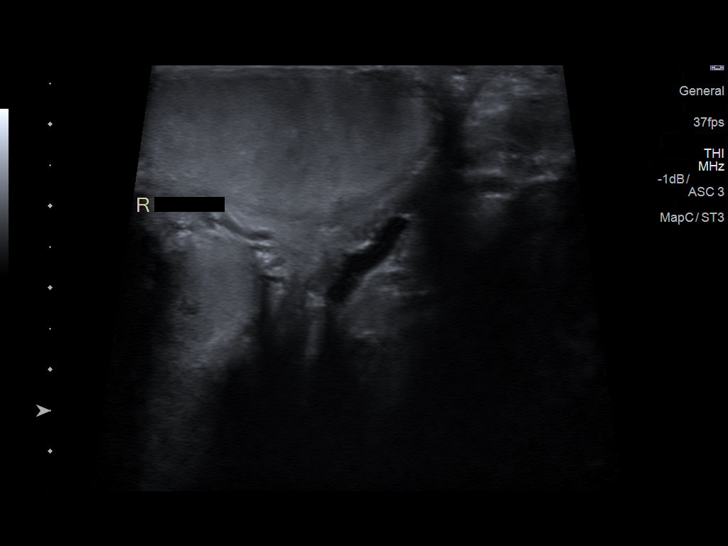

[13 of 25 positions shown; findings below may reference images not displayed]

FINDINGS: Right testicle

Measurements: 3.9 x 2.1 x 2.7. Striated heterogeneous appearance of
the right testicle as before without focal mass. No microlithiasis
visualized.

Left testicle

Measurements: 4.1 x 2.1 x 2.2 cm. Striated heterogeneous appearance
of the left testicle as before without focal mass. No microlithiasis
visualized.

Right epididymis:  Normal in size and appearance.

Left epididymis:  Normal in size and appearance.

Hydrocele: Small complex hydrocele on the right inferior medial to
the right testicle measuring 3.9 x 1.3 x 2.2 cm containing internal
debris.

Varicocele:  None visualized.

Pulsed Doppler interrogation of both testes demonstrates normal low
resistance arterial and venous waveforms bilaterally.

Scrotal skin thickening up to 1.5 cm.
IMPRESSION: No testicular torsion or focal testicular mass. Scrotal skin
thickening consistent with edema with small complex hydrocele
measuring 3.9 x 1.3 x 2.2 cm.

## 2016-05-06 MED ORDER — FAMOTIDINE 20 MG PO TABS: 20.0000 mg | ORAL_TABLET | Freq: Two times a day (BID) | ORAL | 2 refills | Status: DC

## 2016-05-06 NOTE — Assessment & Plan Note (Signed)
Patient has follow up with Dr. Megan Salon on 05/14/16. Compliant with medications.

## 2016-05-06 NOTE — Progress Notes (Signed)
    CC: hospital follow up for chest pain  HPI: [name] is a 56 y.o. male with PMHx of HIV and h/o DVT on Xarelto who presents to the clinic for hospital follow up for recent admission for chest pain.  Patient was recently admitted for chest pain. EKG, troponins and echocardiogram was normal. Patient continues to have occasional chest tightness typically occurring after he eats. He has not tried anything for reflux.   Patient also has chronic draining boils in his left axilla and groin. Since being started on doxycycline, he reports some improvement in tenderness however, they continue to drain. He notes foul smell. These draining lesions have been a problem for him for the last 5 years.   Past Medical History:  Diagnosis Date  . Abscess   . Avascular necrosis of femoral head, left ([place])   . Cholelithiasis   . CKD (chronic kidney disease) stage 3, GFR 30-59 ml/min   . DVT (deep venous thrombosis) ([place])   . HIV (human immunodeficiency virus infection) ([place])   . Morbid obesity ([place])     Review of Systems: Please see pertinent ROS reviewed in HPI and problem based charting.   Physical Exam: Vitals:   05/06/16 1021  BP: (!) 144/69  Pulse: (!) 55  Temp: 97.9 F (36.6 C)  TempSrc: Oral  SpO2: 100%  Weight: (!) 304 lb 3.2 oz (138 kg)  Height: 6' (1.829 m)   General: Vital signs reviewed.  Patient is well-developed and well-nourished, in no acute distress and cooperative with exam.  Cardiovascular: RRR, S1 normal, S2 normal, no murmurs, gallops, or rubs. Pulmonary/Chest: Clear to auscultation bilaterally, no wheezes, rales, or rhonchi. Extremities: No lower extremity edema bilaterally Skin: Left axilla with draining sinus tract and chronically scarred and fibrosed areas with sinus tracts and pitting. 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  Psychiatric: Normal mood and affect. speech and behavior is normal. Cognition and memory are  normal.   Assessment & Plan:  See encounters tab for problem based medical decision making. Patient discussed with Dr. Lynnae January

## 2016-05-06 NOTE — Patient Instructions (Signed)
Continue all medications as prescribed.  For reflux, take famotidine 20 mg twice a day.  We have ordered a CT scan of your chest. We will contact you with this.   We will also refer you to Dermatology for Hidradenitis Suppurativa. Continue your Doxycycline.  Hidradenitis Suppurativa Introduction Hidradenitis suppurativa is a long-term (chronic) skin disease that starts with blocked sweat glands or hair follicles. Bacteria may grow in these blocked openings of your skin. Hidradenitis suppurativa is like a severe form of acne that develops in areas of your body where acne would be unusual. It is most likely to affect the areas of your body where skin rubs against skin and becomes moist. This includes your:  Underarms.  Groin.  Genital areas.  Buttocks.  Upper thighs.  Breasts. Hidradenitis suppurativa may start out with small pimples. The pimples can develop into deep sores that break open (rupture) and drain pus. Over time your skin may thicken and become scarred. Hidradenitis suppurativa cannot be passed from person to person. What are the causes? The exact cause of hidradenitis suppurativa is not known. This condition may be due to:  Male and male hormones. The condition is rare before and after puberty.  An overactive body defense system (immune system). Your immune system may overreact to the blocked hair follicles or sweat glands and cause swelling and pus-filled sores. What increases the risk? You may have a higher risk of hidradenitis suppurativa if you:  Are a woman.  Are between ages 38 and 41.  Have a family history of hidradenitis suppurativa.  Have a personal history of acne.  Are overweight.  Smoke.  Take the drug lithium. What are the signs or symptoms? The first signs of an outbreak are usually painful skin bumps that look like pimples. As the condition progresses:  Skin bumps may get bigger and grow deeper into the skin.  Bumps under the skin may  rupture and drain smelly pus.  Skin may become itchy and infected.  Skin may thicken and scar.  Drainage may continue through tunnels under the skin (fistulas).  Walking and moving your arms can become painful. How is this diagnosed? Your health care provider may diagnose hidradenitis suppurativa based on your medical history and your signs and symptoms. A physical exam will also be done. You may need to see a health care provider who specializes in skin diseases (dermatologist). You may also have tests done to confirm the diagnosis. These can include:  Swabbing a sample of pus or drainage from your skin so it can be sent to the lab and tested for infection.  Blood tests to check for infection. How is this treated? The same treatment will not work for everybody with hidradenitis suppurativa. Your treatment will depend on how severe your symptoms are. You may need to try several treatments to find what works best for you. Part of your treatment may include cleaning and bandaging (dressing) your wounds. You may also have to take medicines, such as the following:  Antibiotics.  Acne medicines.  Medicines to block or suppress the immune system.  A diabetes medicine (metformin) is sometimes used to treat this condition.  For women, birth control pills can sometimes help relieve symptoms. You may need surgery if you have a severe case of hidradenitis suppurativa that does not respond to medicine. Surgery may involve:  Using a laser to clear the skin and remove hair follicles.  Opening and draining deep sores.  Removing the areas of skin that are diseased  and scarred. Follow these instructions at home:  Learn as much as you can about your disease, and work closely with your health care providers.  Take medicines only as directed by your health care provider.  If you were prescribed an antibiotic medicine, finish it all even if you start to feel better.  If you are overweight,  losing weight may be very helpful. Try to reach and maintain a healthy weight.  Do not use any tobacco products, including cigarettes, chewing tobacco, or electronic cigarettes. If you need help quitting, ask your health care provider.  Do not shave the areas where you get hidradenitis suppurativa.  Do not wear deodorant.  Wear loose-fitting clothes.  Try not to overheat and get sweaty.  Take a daily bleach bath as directed by your health care provider.  Fill your bathtub halfway with water.  Pour in  cup of unscented household bleach.  Soak for 5-10 minutes.  Cover sore areas with a warm, clean washcloth (compress) for 5-10 minutes. Contact a health care provider if:  You have a flare-up of hidradenitis suppurativa.  You have chills or a fever.  You are having trouble controlling your symptoms at home. This information is not intended to replace advice given to you by your health care provider. Make sure you discuss any questions you have with your health care provider. Document Released: 10/31/2003 Document Revised: 08/24/2015 Document Reviewed: 06/18/2013  2017 Elsevier

## 2016-05-06 NOTE — Assessment & Plan Note (Addendum)
Seen on CT abd/pelvis. Recommendations are to repeat CT scan.  Plan: -CT Chest wo contrast  Addendum: CT shows likely scarring. Recommend repeat CT Chest in February 2019.

## 2016-05-06 NOTE — Assessment & Plan Note (Signed)
Patient also has chronic draining boils in his left axilla and groin. Since being started on doxycycline, he reports some improvement in tenderness however, they continue to drain. He notes foul smell. These draining lesions have been a problem for him for the last 5 years. On exam, left axilla with draining sinus tract and chronically scarred and fibrosed areas with sinus tracts and pitting. 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   Assessment: Hidradenitis Suppurativa; [place] Stage III  Plan: -Continue Doxycycline -Weight loss, avoid smoking -Referral to Derm -May benefit from [facility] if able to obtain Medicaid transportation- will discuss with [facility] -Loose clothing, keep area clean and dry

## 2016-05-06 NOTE — Assessment & Plan Note (Signed)
Patient was recently admitted for chest pain. EKG, troponins and echocardiogram was normal. Patient continues to have occasional chest tightness typically occurring after he eats. He has not tried anything for reflux.   Plan:-Trial of famotidine BID for GERD, weight loss.

## 2016-05-06 NOTE — Assessment & Plan Note (Signed)
Needs to be addressed at follow up visit.

## 2016-05-07 NOTE — Progress Notes (Signed)
[facility] Clinic Attending  Case discussed with Dr. Burns soon after the resident saw the patient.  We reviewed the resident's history and exam and pertinent patient test results.  I agree with the assessment, diagnosis, and plan of care documented in the resident's note. 

## 2016-05-14 ENCOUNTER — Ambulatory Visit: Payer: Medicaid Other | Admitting: Internal Medicine

## 2016-05-17 ENCOUNTER — Ambulatory Visit (INDEPENDENT_AMBULATORY_CARE_PROVIDER_SITE_OTHER): Payer: Medicaid Other | Admitting: Infectious Diseases

## 2016-05-17 ENCOUNTER — Ambulatory Visit (HOSPITAL_COMMUNITY)
Admission: RE | Admit: 2016-05-17 | Discharge: 2016-05-17 | Disposition: A | Discharge status: Home / Self Care | Payer: Medicaid Other | Source: Ambulatory Visit | Attending: Internal Medicine | Admitting: Internal Medicine

## 2016-05-17 ENCOUNTER — Encounter: Payer: Self-pay | Admitting: Infectious Diseases

## 2016-05-17 VITALS — BP 130/74 | HR 72 | Temp 98.8°F | Wt 300.0 lb

## 2016-05-17 DIAGNOSIS — R911 Solitary pulmonary nodule: Secondary | ICD-10-CM | POA: Diagnosis present

## 2016-05-17 DIAGNOSIS — K8020 Calculus of gallbladder without cholecystitis without obstruction: Secondary | ICD-10-CM | POA: Diagnosis not present

## 2016-05-17 DIAGNOSIS — M5134 Other intervertebral disc degeneration, thoracic region: Secondary | ICD-10-CM | POA: Diagnosis not present

## 2016-05-17 DIAGNOSIS — M954 Acquired deformity of chest and rib: Secondary | ICD-10-CM | POA: Insufficient documentation

## 2016-05-17 DIAGNOSIS — N189 Chronic kidney disease, unspecified: Secondary | ICD-10-CM | POA: Diagnosis not present

## 2016-05-17 DIAGNOSIS — L732 Hidradenitis suppurativa: Secondary | ICD-10-CM

## 2016-05-17 DIAGNOSIS — M87052 Idiopathic aseptic necrosis of left femur: Secondary | ICD-10-CM | POA: Diagnosis not present

## 2016-05-17 DIAGNOSIS — Z23 Encounter for immunization: Secondary | ICD-10-CM | POA: Diagnosis not present

## 2016-05-17 DIAGNOSIS — Z113 Encounter for screening for infections with a predominantly sexual mode of transmission: Secondary | ICD-10-CM

## 2016-05-17 DIAGNOSIS — B20 Human immunodeficiency virus [HIV] disease: Secondary | ICD-10-CM

## 2016-05-17 DIAGNOSIS — Z79899 Other long term (current) drug therapy: Secondary | ICD-10-CM

## 2016-05-17 IMAGING — CT CT CHEST W/O CM
2 of 3 series · 15 of 36 positions shown, 18 images · non-contrast
Comparison: Images of the lung bases CT chest [DATE]

CLINICAL DATA: Follow-up pulmonary nodule

EXAM:
CT CHEST WITHOUT CONTRAST
TECHNIQUE: Multidetector CT imaging of the chest was performed following the
standard protocol without IV contrast.

[Series 2: chest w/o 2mm st · axial · non-contrast · 0.82mm/px · z∈[+1387,+1641]mm · 12 of 151 slices shown, 15 images]
[im 12/151  mediastinal]
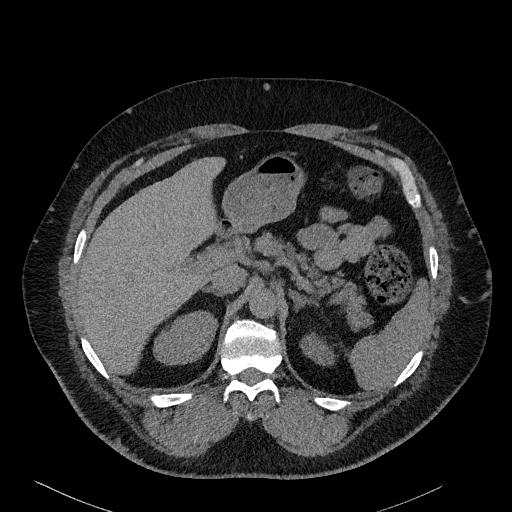
[im 12/151  lung]
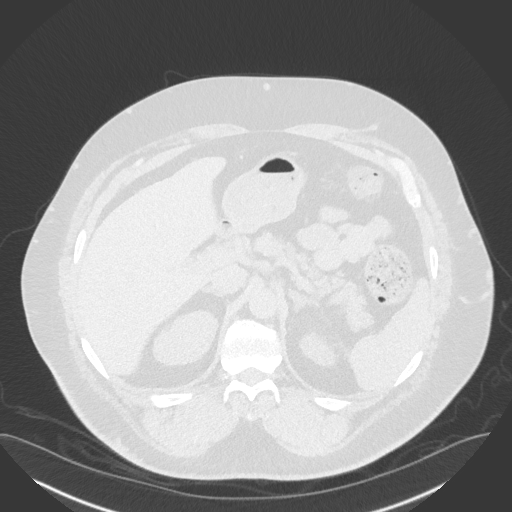
[im 23/151  lung]
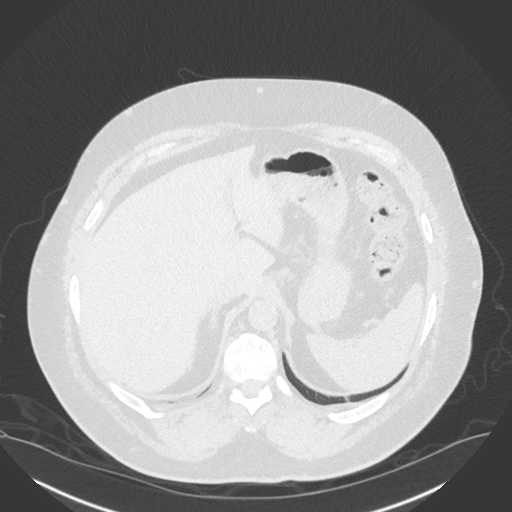
[im 34/151  lung]
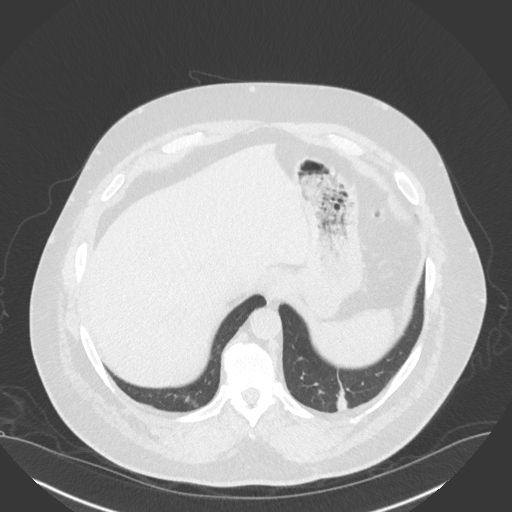
[im 45/151  lung]
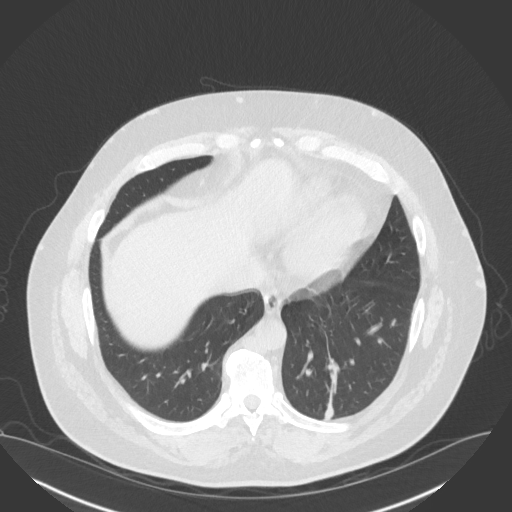
[im 56/151  mediastinal]
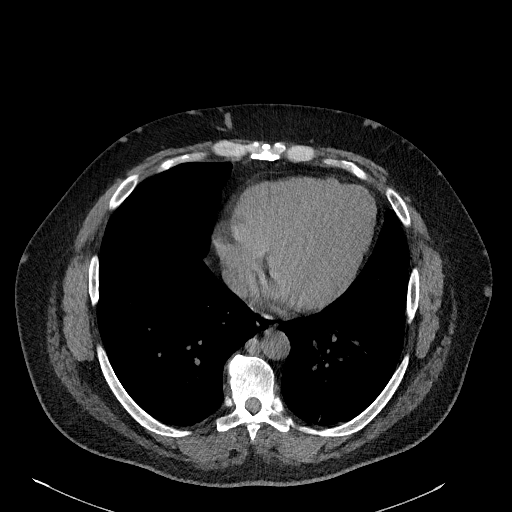
[im 56/151  lung]
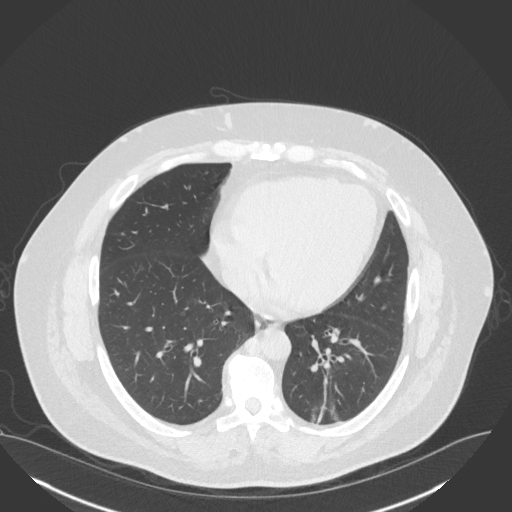
[im 67/151  lung]
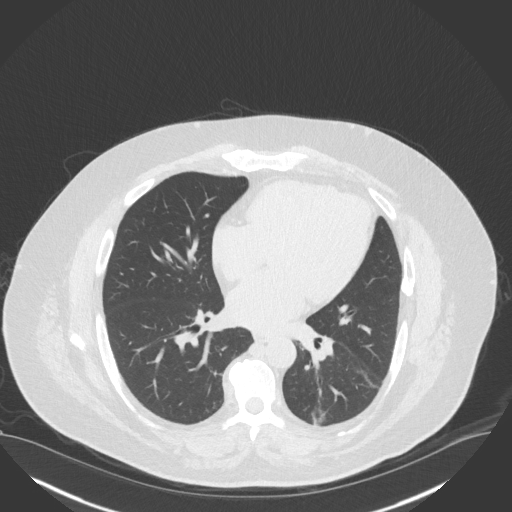
[im 84/151  lung]
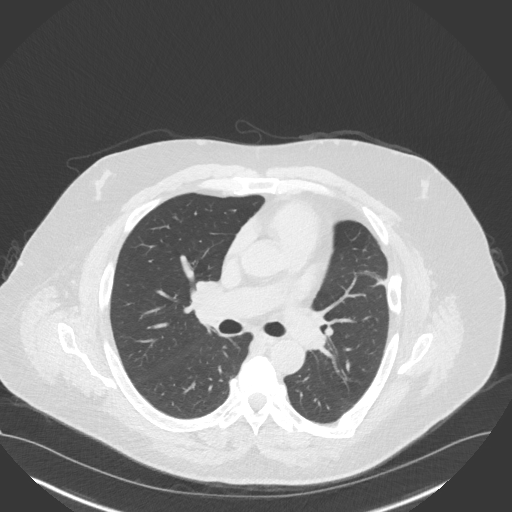
[im 95/151  lung]
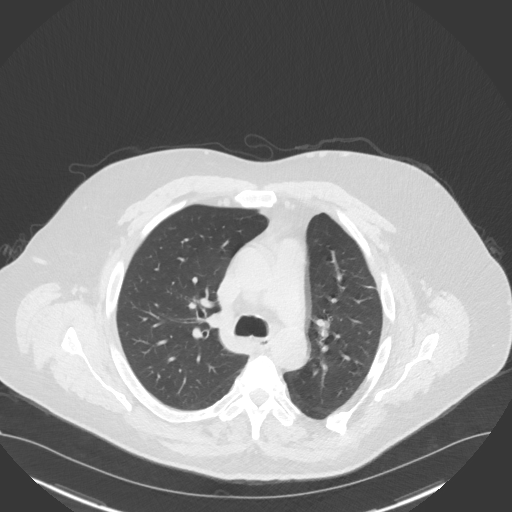
[im 106/151  mediastinal]
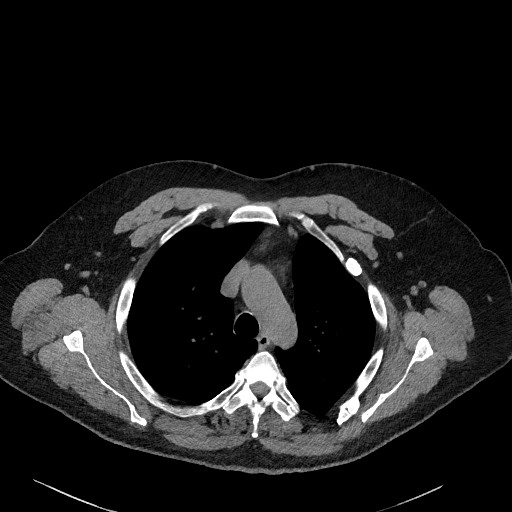
[im 106/151  lung]
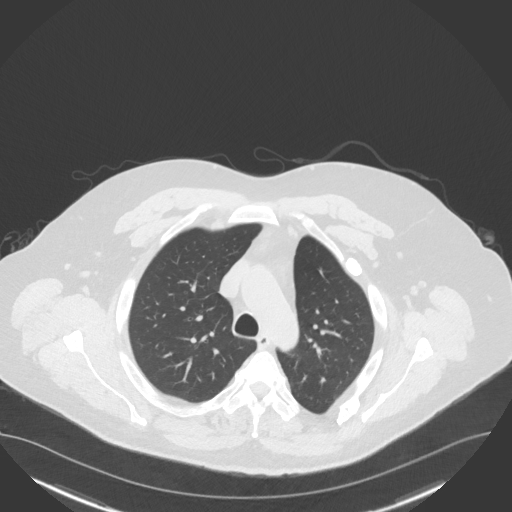
[im 117/151  lung]
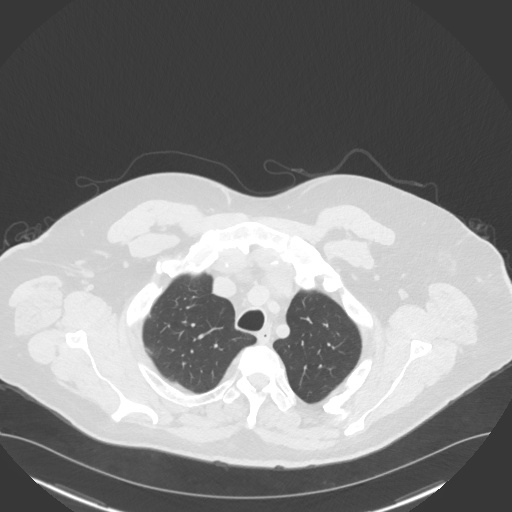
[im 128/151  lung]
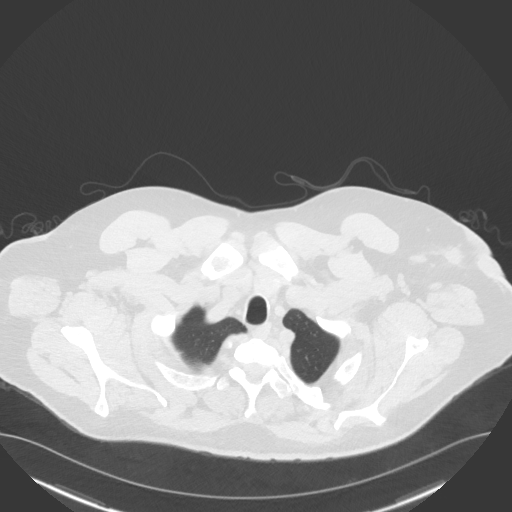
[im 139/151  lung]
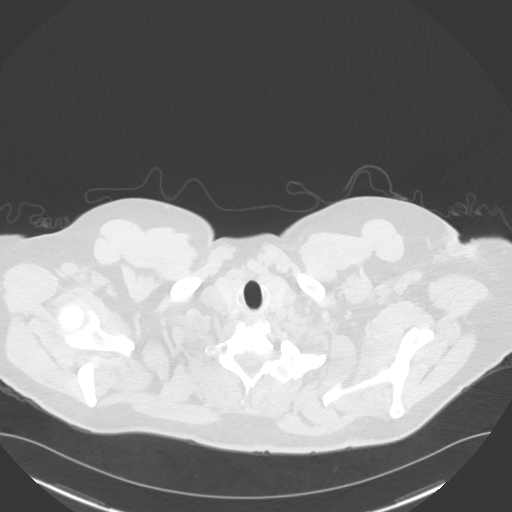

[Series 4: chest w/o 3mm st cor · coronal · non-contrast · 0.59mm/px · 3 of 101 slices shown]
[im 21/101  lung]
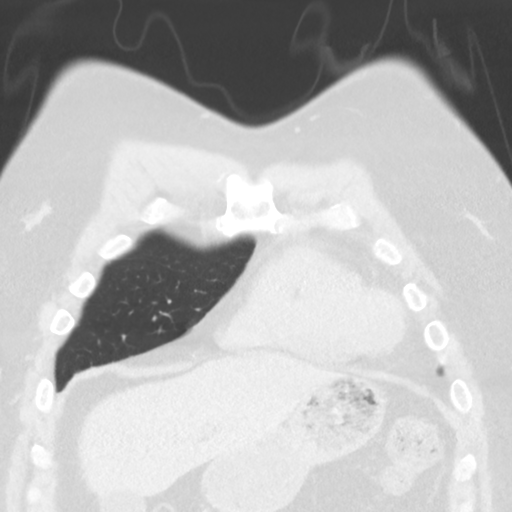
[im 41/101  lung]
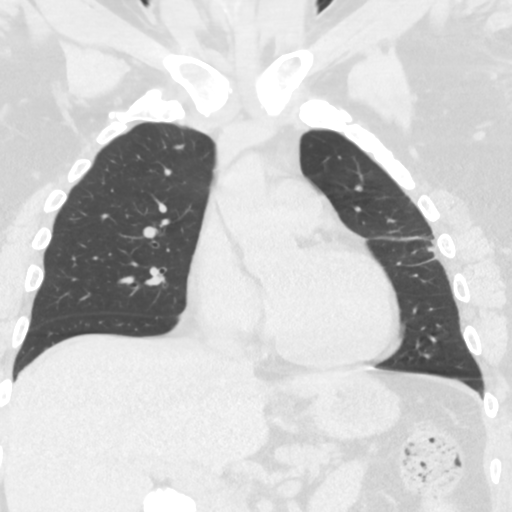
[im 61/101  lung]
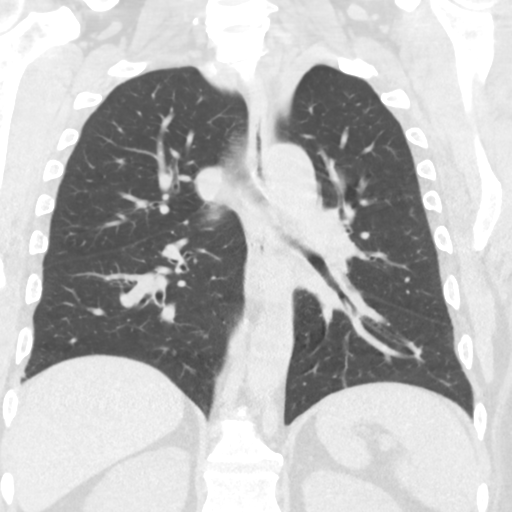

[15 of 36 positions shown; findings below may reference images not displayed]

FINDINGS: Cardiovascular: Heart size within normal limits. No pericardial
effusion. No aortic aneurysm. No atherosclerotic vascular
calcifications are noted.

Mediastinum/Nodes: Central airways are patent. There is no
mediastinal hematoma or adenopathy. No hilar adenopathy is noted on
this unenhanced scan.

Lungs/Pleura: Images of the lung parenchyma shows no acute
infiltrate or pulmonary edema. No evidence of bronchiectasis or
emphysema. No pleural thickening or pleural plaques. Again noted a
elongated linear somewhat triangular area left lower lobe
posteriorly Measures 1.6 by 1 cm inferiorly. This corresponds with
nodular density seen on CT scan [DATE]. This most likely
represents scarring or sequela prior lung injury. No definite
evidence of mass. This is best seen in coronal image 82 extending 3
cm cranial caudally. Please see also sagittal image 74.

Axial image 70 is similar area of scarring noted in left upper lobe
anteriorly laterally. There is no pneumothorax.

Upper Abdomen: The visualized upper abdomen shows no adrenal gland
mass. No nephrolithiasis. Multiple calcified layering gallstones are
noted within gallbladder the largest measures 6.6 mm. No thickening
of gallbladder wall. No pericholecystic fluid. Visualized unenhanced
pancreas and spleen is unremarkable.

Musculoskeletal: No destructive bony lesions are noted. Multiple
left rib fractures deformities are noted left hemithorax. Sagittal
images of the spine shows mild degenerative changes mid thoracic
spine. Sagittal view of the sternum is unremarkable.
IMPRESSION: 1. Again noted somewhat elongated triangular area of consolidation
in left lower lobe posteriorly. Measures 1.6 by 1 cm inferiorly.
This corresponds with nodular density seen on CT scan [DATE].
This most likely represents scarring or sequela from prior lung
injury. No definite evidence of mass. This is best seen in coronal
image 82 extending 3 cm cranial caudally. Please see also sagittal
image 74. No follow-up is necessary unless clinically warranted. I
would suggest a follow-up CT scan in 1 year to assure stability. No
definite evidence of mass noted on this unenhanced scan.
2. No infiltrate or pulmonary edema. Small area of peripheral
scarring noted in left upper lobe peripheral coronal image 45.
3. No mediastinal hematoma or adenopathy.
4. Multiple old left rib fractures deformities are noted.
5. Cholelithiasis. No evidence of acute cholecystitis or
pericholecystic fluid.
6. Mild degenerative changes thoracic spine.

## 2016-05-17 MED ORDER — CEPHALEXIN 500 MG PO CAPS: 500.0000 mg | ORAL_CAPSULE | Freq: Two times a day (BID) | ORAL | 0 refills | Status: DC

## 2016-05-17 NOTE — Assessment & Plan Note (Signed)
Will change his anbx to keflex.  Will try to get him into derm and surgery Long term will get him onto clinda.

## 2016-05-17 NOTE — Assessment & Plan Note (Addendum)
Will get him eval by CKD.  Cr Cl 52 last month.

## 2016-05-17 NOTE — Progress Notes (Signed)
   Subjective:    Patient ID: Allen Espinoza, male    DOB: 1960/06/23, 56 y.o.   MRN: [name]  HPI 56 yo M with hx of HIV+ (dx 2007 when he was DC). He is currently taking genvoya/prezista , mepron. No problems with his ART. Denies hx of OI, dx at routine f/u.  He was adm 1-26 to 04-27-16 with chest pain. He was ruled out for ACS.  He also has a hx of hidradenitis suppurative. He was treated with doxy (his Cx in hospital was polymicrobial- strep, E coli).  Soreness has imporved but he still has drainage. Has not seen derm prior.  He had abscess lanced on side of his scrotum which still occasionally bleeds.  He also has a hx of DVT in LLE (2013)- he has been on coumadin --> xaralto.   HIV 1 RNA Quant (copies/mL)  Date Value  04/26/2016 30   HIV-1 RNA Viral Load (copies/mL)  Date Value  03/18/2016 47,300   CD4 T Cell Abs (/uL)  Date Value  04/26/2016 200 (L)  03/18/2016 140 (L)   The past medical history, family history and social history were reviewed/updated in EPIC  Review of Systems  Constitutional: Negative for appetite change, chills, fever and unexpected weight change.  Respiratory: Positive for shortness of breath. Negative for cough.   Cardiovascular: Positive for chest pain and leg swelling.  Gastrointestinal: Negative for constipation and diarrhea.  Genitourinary: Negative for difficulty urinating.  Skin: Positive for wound.  Neurological: Positive for headaches.   Wt up 30# since Sept.     Objective:   Physical Exam  Constitutional: He appears well-developed and well-nourished.  HENT:  Mouth/Throat: No oropharyngeal exudate.  Eyes: EOM are normal. Pupils are equal, round, and reactive to light.  Neck: Neck supple.  Cardiovascular: Normal rate, regular rhythm and normal heart sounds.   Pulmonary/Chest: Effort normal and breath sounds normal.  Abdominal: Soft. Bowel sounds are normal.  Genitourinary:     Musculoskeletal: He exhibits edema.        Arms: Lymphadenopathy:    He has no cervical adenopathy.      Assessment & Plan:

## 2016-05-17 NOTE — Assessment & Plan Note (Signed)
He appears to be doing well.  He is on a salvage regimen which he is tolerating well.  Will stop his PCP prophylaxis. CD4 > 200.  Will give him pnvx Will see him back in 3 months.  Fiance got tested in July, counseled her that she needs to get tested at least every year.  Offered/refused condoms.  Explained services offered at clinic.  My great appreciation to the outstanding care provided by the [facility]

## 2016-05-17 NOTE — Assessment & Plan Note (Signed)
Will work on getting him in to see ortho.

## 2016-05-20 ENCOUNTER — Encounter: Payer: Self-pay | Admitting: Internal Medicine

## 2016-05-20 NOTE — Addendum Note (Signed)
Addended by: Janyce Llanos F on: 05/20/2016 10:20 AM   Modules accepted: Orders

## 2016-06-03 ENCOUNTER — Other Ambulatory Visit: Payer: Self-pay | Admitting: Infectious Diseases

## 2016-06-03 DIAGNOSIS — L732 Hidradenitis suppurativa: Secondary | ICD-10-CM

## 2016-06-11 ENCOUNTER — Telehealth: Payer: Self-pay | Admitting: *Deleted

## 2016-06-11 ENCOUNTER — Other Ambulatory Visit: Payer: Self-pay | Admitting: *Deleted

## 2016-06-11 ENCOUNTER — Other Ambulatory Visit: Payer: Self-pay | Admitting: Infectious Diseases

## 2016-06-11 DIAGNOSIS — L732 Hidradenitis suppurativa: Secondary | ICD-10-CM

## 2016-06-11 MED ORDER — CEPHALEXIN 500 MG PO CAPS: 500.0000 mg | ORAL_CAPSULE | Freq: Two times a day (BID) | ORAL | 0 refills | Status: DC

## 2016-06-11 NOTE — Telephone Encounter (Signed)
That would be fine Non-urgent thanks

## 2016-06-11 NOTE — Telephone Encounter (Signed)
Signed refill for keflex  Could he go to [facility] for Derm?  thanks

## 2016-06-11 NOTE — Telephone Encounter (Signed)
Patient called requesting a refill on Keflex for his hydradenitis. Referral coordinator is working on referral for dermatology. I reached out to several offices and none take medicaid. Please advise

## 2016-06-11 NOTE — Telephone Encounter (Signed)
Patient does not have transportation to go to [facility]. He said the [facility] takes Medicaid and would like to be referred there. Is this an urgent referral?

## 2016-07-12 ENCOUNTER — Other Ambulatory Visit: Payer: Self-pay | Admitting: Internal Medicine

## 2016-07-15 NOTE — Telephone Encounter (Signed)
Refill request from pt's pharmacy.  Pt also followed by [facility].  Should I route refill request to them or can you refill on pt's behalf.  Please advise.[name], [name]4/16/20189:00 AM

## 2016-07-22 ENCOUNTER — Telehealth: Payer: Self-pay | Admitting: Internal Medicine

## 2016-07-22 NOTE — Telephone Encounter (Signed)
CALLED AND TALKED WITH PATIENT, ADVISED HIM SINCE HE HAS MEDICAID HE COULD APPLY FOR TRANSPORTATION WITH [place] AND THEY WOULD PROVIDE HIM WITH RIDE TO [place].

## 2016-07-31 ENCOUNTER — Encounter: Payer: Medicaid Other | Admitting: Internal Medicine

## 2016-07-31 ENCOUNTER — Telehealth: Payer: Self-pay | Admitting: *Deleted

## 2016-07-31 DIAGNOSIS — L732 Hidradenitis suppurativa: Secondary | ICD-10-CM | POA: Diagnosis not present

## 2016-07-31 NOTE — Progress Notes (Deleted)
   CC: ***  HPI:  [name] is a 56 y.o. male with PMHx detailed below presenting with ***  See problem based assessment and plan below for additional details.  Past Medical History:  Diagnosis Date  . Abscess   . Avascular necrosis of femoral head, left ([place])   . Cholelithiasis   . CKD (chronic kidney disease) stage 3, GFR 30-59 ml/min   . DVT (deep venous thrombosis) ([place])   . HIV (human immunodeficiency virus infection) ([place])   . Morbid obesity ([place])     Review of Systems: ROS   Physical Exam: There were no vitals filed for this visit. There is no height or weight on file to calculate BMI. GENERAL- *** sitting comfortably in exam room chair, alert, in no distress HEENT- Atraumatic, PERRL, EOMI, moist mucous membranes, good dentition, no carotid bruit, no cervical lymphadenopathy CARDIAC- Regular rate and rhythm, no murmurs, rubs or gallops. RESP- Clear to ascultation bilaterally, no wheezing or crackles, normal work of breathing ABDOMEN- Normoactive bowel sounds, soft, nontender, nondistended BACK- Normal curvature, no paraspinal tenderness, no CVA tenderness. NEURO- Alert and oriented, cranial nerves grossly intact EXTREMITIES- Normal bulk and range of motion, no edema, 2+ peripheral pulses SKIN- Warm, dry, intact, without visible rash PSYCH- Appropriate affect, clear speech, thoughts linear and goal-directed ***  Assessment & Plan:   See encounters tab for problem based medical decision making.  Patient {GC/GE:[id number]::"discussed with","seen with"} Dr. {NAMES:[id number]::"Butcher","Granfortuna","E. Hoffman","Klima","Mullen","Narendra","Vincent"}

## 2016-07-31 NOTE — Telephone Encounter (Signed)
[name], [place] [facility] called after patient was seen there today, referred by Dr [name] for hidradenitis.  She would like to speak with Dr [name] regarding potential interactions/safety between proposed Humira and his HIV medication/status. RN gave [facility] Dr Hatcher's pager. Landis Gandy, RN

## 2016-08-01 ENCOUNTER — Emergency Department (HOSPITAL_COMMUNITY)
Admission: EM | Admit: 2016-08-01 | Discharge: 2016-08-01 | Disposition: A | Discharge status: Home / Self Care | Payer: Medicaid Other | Attending: Emergency Medicine | Admitting: Emergency Medicine

## 2016-08-01 ENCOUNTER — Encounter (HOSPITAL_COMMUNITY): Payer: Self-pay | Admitting: Emergency Medicine

## 2016-08-01 ENCOUNTER — Encounter: Payer: Self-pay | Admitting: Internal Medicine

## 2016-08-01 DIAGNOSIS — N183 Chronic kidney disease, stage 3 (moderate): Secondary | ICD-10-CM | POA: Insufficient documentation

## 2016-08-01 DIAGNOSIS — L732 Hidradenitis suppurativa: Secondary | ICD-10-CM | POA: Diagnosis not present

## 2016-08-01 DIAGNOSIS — Z7901 Long term (current) use of anticoagulants: Secondary | ICD-10-CM | POA: Insufficient documentation

## 2016-08-01 DIAGNOSIS — L02412 Cutaneous abscess of left axilla: Secondary | ICD-10-CM | POA: Diagnosis present

## 2016-08-01 DIAGNOSIS — Z79899 Other long term (current) drug therapy: Secondary | ICD-10-CM | POA: Diagnosis not present

## 2016-08-01 LAB — I-STAT CHEM 8, ED
BUN: 21 mg/dL — AB (ref 6–20)
Calcium, Ion: 1.12 mmol/L — ABNORMAL LOW (ref 1.15–1.40)
Chloride: 108 mmol/L (ref 101–111)
Creatinine, Ser: 1.5 mg/dL — ABNORMAL HIGH (ref 0.61–1.24)
Glucose, Bld: 88 mg/dL (ref 65–99)
HEMATOCRIT: 33 % — AB (ref 39.0–52.0)
Hemoglobin: 11.2 g/dL — ABNORMAL LOW (ref 13.0–17.0)
Potassium: 4.8 mmol/L (ref 3.5–5.1)
SODIUM: 141 mmol/L (ref 135–145)
TCO2: 25 mmol/L (ref 0–100)

## 2016-08-01 LAB — I-STAT CG4 LACTIC ACID, ED: LACTIC ACID, VENOUS: 0.76 mmol/L (ref 0.5–1.9)

## 2016-08-01 LAB — CBC
HCT: 33.4 % — ABNORMAL LOW (ref 39.0–52.0)
HEMOGLOBIN: 11.1 g/dL — AB (ref 13.0–17.0)
MCH: 32.6 pg (ref 26.0–34.0)
MCHC: 33.2 g/dL (ref 30.0–36.0)
MCV: 97.9 fL (ref 78.0–100.0)
PLATELETS: 240 10*3/uL (ref 150–400)
RBC: 3.41 MIL/uL — AB (ref 4.22–5.81)
RDW: 14.2 % (ref 11.5–15.5)
WBC: 8.5 10*3/uL (ref 4.0–10.5)

## 2016-08-01 MED ORDER — AMOXICILLIN-POT CLAVULANATE 500-125 MG PO TABS: 1.0000 | ORAL_TABLET | Freq: Three times a day (TID) | ORAL | 0 refills | Status: AC

## 2016-08-01 NOTE — ED Provider Notes (Signed)
[facility] DEPT Provider Note   CSN: [id number] Arrival date & time: 08/01/16  1519     History   Chief Complaint Chief Complaint  Patient presents with  . Abscess  . Abnormal Lab    HPI Allen Espinoza is a 56 y.o. male with a h/o of HIV and severe HS who presents to the Emergency Department for acute worsening of chronic abscesses. He reports a waxing and waning chronic abscess to the left axilla for the last 8 months. He reports the area has previously been I&Ded without resolution. He also reports an enlarging abscess to the bilateral perirectal area that extends superiorly to the inferior portion of the buttocks. He reports that he was evaluated earlier today at [facility] and states that he was told to come the to the ED to have a CD4 count performed. He reports that he recently obtained Medicaid and has been referred to a surgeon in [place] who would like to initiate treatment with Humira, but is concerned about the patient's CD4 count. He is followed by Dr. [name] in the ID clinic. Last CD4 count was 200 on 1/26. Patient reports he has been compliant with HAART therapy since obtaining medicaid coverage.   He denies fever, chills, chest pain, or abdominal pain. He reports active drainage from the left axilla. His fiancee reports that she has noticed worsening of the perirectal abscess with more "small holes" that have drainage when she is helping to care for his wounds. He reports he was started on doxycycline within the last week, but another antibiotic was called in for him, which he is uncertain of the name, but has not picked it up because his insurance would not pay for it. He reports he has been compliant with doxycycline.   The history is provided by the patient and a significant other. No language interpreter was used.  Patient and his fiancee are poor historians.   Past Medical History:  Diagnosis Date  . Abscess   . Avascular necrosis of femoral head, left ([place])    . Cholelithiasis   . CKD (chronic kidney disease) stage 3, GFR 30-59 ml/min   . DVT (deep venous thrombosis) ([place])   . HIV (human immunodeficiency virus infection) ([place])   . Morbid obesity [facility])     Patient Active Problem List   Diagnosis Date Noted  . Solitary pulmonary nodule 05/06/2016  . Hidradenitis suppurativa 04/27/2016  . Chest pain 04/26/2016  . Avascular necrosis of femoral head, left ([place]) 03/19/2016  . Human immunodeficiency virus (HIV) disease ([place]) 03/18/2016  . DVT (deep venous thrombosis) ([place]) 03/18/2016  . Axillary abscess 03/18/2016  . Chronic kidney disease 03/18/2016  . Encounter for immunization 03/18/2016  . Cholelithiasis 03/18/2016  . Diverticulosis of colon without hemorrhage 03/18/2016    History reviewed. No pertinent surgical history.     Home Medications    Prior to Admission medications   Medication Sig Start Date End Date Taking? Authorizing Provider  doxycycline (VIBRA-TABS) 100 MG tablet Take 100 mg by mouth 3 (three) times daily.   Yes [provider]  GENVOYA 150-150-200-10 MG TABS tablet TAKE ONE TABLET BY MOUTH ONCE DAILY WITH BREAKFAST 07/15/16  Yes Asencion Partridge, MD  ibuprofen (ADVIL,MOTRIN) 800 MG tablet Take 800 mg by mouth every 8 (eight) hours as needed.   Yes [provider]  PREZISTA 800 MG tablet TAKE 1 TABLET BY MOUTH ONCE DAILY WITH BREAKFAST 07/15/16  Yes Asencion Partridge, MD  XARELTO 20 MG TABS tablet  TAKE ONE TABLET BY MOUTH IN THE MORNING 07/15/16  Yes Asencion Partridge, MD  amoxicillin-clavulanate (AUGMENTIN) 500-125 MG tablet Take 1 tablet (500 mg total) by mouth every 8 (eight) hours. 08/01/16 08/15/16  Jaylenne Hamelin A, PA-C  cephALEXin (KEFLEX) 500 MG capsule Take 1 capsule (500 mg total) by mouth 2 (two) times daily. Patient not taking: Reported on 08/01/2016 06/11/16   Campbell Riches, MD  famotidine (PEPCID) 20 MG tablet Take 1 tablet (20 mg total) by mouth 2 (two) times daily. Patient not taking: Reported on  05/17/2016 05/06/16   Florinda Marker, MD    Family History Family History  Problem Relation Age of Onset  . Heart attack Mother   . Heart attack Father     Social History Social History  Substance Use Topics  . Smoking status: Never Smoker  . Smokeless tobacco: Never Used  . Alcohol use No     Comment: prior     Allergies   Sulfa antibiotics and Vancomycin   Review of Systems Review of Systems  Constitutional: Negative for chills, fatigue and fever.  Eyes: Negative for visual disturbance.  Respiratory: Negative for shortness of breath.   Cardiovascular: Negative for chest pain.  Gastrointestinal: Negative for abdominal pain, blood in stool, diarrhea, nausea, rectal pain and vomiting.  Genitourinary: Negative for dysuria and flank pain.  Musculoskeletal: Negative for arthralgias, back pain and myalgias.  Skin: Positive for wound. Negative for rash.  Allergic/Immunologic: Positive for immunocompromised state.  Psychiatric/Behavioral: Negative for confusion.     Physical Exam Updated Vital Signs BP (!) 143/72 (BP Location: Right Arm)   Pulse 66   Temp 97.8 F (36.6 C) (Oral)   Resp (!) 22   Ht 6' (1.829 m)   Wt (!) 140.6 kg   SpO2 99%   BMI 42.04 kg/m   Physical Exam  Constitutional: He appears well-developed and well-nourished.  HENT:  Head: Normocephalic and atraumatic.  Eyes: Conjunctivae are normal.  Neck: Neck supple.  Cardiovascular: Normal rate, regular rhythm, normal heart sounds and intact distal pulses.  Exam reveals no gallop and no friction rub.   No murmur heard. Pulmonary/Chest: Effort normal and breath sounds normal. No respiratory distress. He has no wheezes. He has no rales.  Abdominal: Soft. He exhibits no distension. There is no tenderness. There is no guarding.  Musculoskeletal: He exhibits tenderness. He exhibits no edema.  Neurological: He is alert.  Skin: Skin is warm and dry. No erythema.  There is a 4x5 cm actively draining abscess  to the left axilla. Serosanginous drainage. There are are large areas of fibrotic, thick plaques with scarring surrounding the draining areas. No obvious fluctuance.   There is a large area of dense fibrotic tissue with active serosanguinous drainage from what appears to be several sinus tracts. The fibrotic plaques with scarring surround the perirectal area and extend superiorly to the bilateral inferior buttocks. No obvious fluctuance that is able to be drained.   Psychiatric: His behavior is normal.  Nursing note and vitals reviewed.    ED Treatments / Results  Labs (all labs ordered are listed, but only abnormal results are displayed) Labs Reviewed  CBC - Abnormal; Notable for the following:       Result Value   RBC 3.41 (*)    Hemoglobin 11.1 (*)    HCT 33.4 (*)    All other components within normal limits  T-HELPER CELLS (CD4) COUNT (NOT AT [facility]) - Abnormal; Notable for the following:  CD4 T Cell Abs 130 (*)    CD4 % Helper T Cell 7 (*)    All other components within normal limits  I-STAT CHEM 8, ED - Abnormal; Notable for the following:    BUN 21 (*)    Creatinine, Ser 1.50 (*)    Calcium, Ion 1.12 (*)    Hemoglobin 11.2 (*)    HCT 33.0 (*)    All other components within normal limits  I-STAT CG4 LACTIC ACID, ED    EKG  EKG Interpretation None       Radiology No results found.  Procedures Procedures (including critical care time)  Medications Ordered in ED Medications - No data to display   Initial Impression / Assessment and Plan / ED Course  I have reviewed the triage vital signs and the nursing notes.  Pertinent labs & imaging results that were available during my care of the patient were reviewed by me and considered in my medical decision making (see chart for details).     56 year old male with hidradenitis suppurativa and HIV with chronic inflammatory lesions to the left axilla and perirectal area. He is followed by the [facility] and  Dr. [name] in the ID clinic. Patient reports that he was sent to the ED to have his CD4 count performed so that it will come back more quickly for him to be referred to a dermatologist who specializes in [facility] in [place]. He also reports he was sent to the ED for possible IV medications, but both he and his fiancee are somewhat poor historians. Upon more investigation, it seems as if the [facility] was possibly concerned about sepsis. Lactate normal. No leukocytosis. Patient is afebrile with out tachycardia. Systolic BP in the 641-583E. Discussed and evaluated the patient with Dr. Eulis Foster who spoke with Dr. [name] this afternoon. Will have the patient continue to take doxycycline and add Augmentin until he has a follow up with with Dr. [name] on 5/16. Patient has continued follow up with surgery. VSS. NAD.    Final Clinical Impressions(s) / ED Diagnoses   Final diagnoses:  Hidradenitis suppurativa of left axilla  Hidradenitis suppurativa of anus    New Prescriptions Discharge Medication List as of 08/01/2016  7:49 PM    START taking these medications   Details  amoxicillin-clavulanate (AUGMENTIN) 500-125 MG tablet Take 1 tablet (500 mg total) by mouth every 8 (eight) hours., Starting Thu 08/01/2016, Until Thu 08/15/2016, Print         Joli Koob A, PA-C 08/03/16 [id number]    Daleen Bo, MD 08/07/16 [phone]

## 2016-08-01 NOTE — Telephone Encounter (Signed)
Spoke to them. thanks 

## 2016-08-01 NOTE — ED Triage Notes (Signed)
Patient reports abscess to groin region and abscess under left arm region with drainage/bleeding since October. Patient was also sent due to a low CD4 count.

## 2016-08-01 NOTE — Discharge Instructions (Signed)
Please keep your appointment with Dr. [name]. Please keep your appointment with surgery. Please continue to take both the Augmentin and Doxycycline until you are seen by Dr. [name]. If you develop a fever, chills, or worsening symptoms, please return to the Emergency Department for re-evaluation.

## 2016-08-01 NOTE — ED Provider Notes (Signed)
  Face-to-face evaluation   History: Patient here to be evaluated for complications from hidradenitis.  His symptoms are progressing despite current treatment.  He was seen by a specialist who wanted to place him on Humira, and was sent here for further evaluation  Physical exam: Alert, cooperative.  No respiratory distress.  No dysarthria or aphasia.  He is able to move his arms and legs normally.  Medical screening examination/treatment/procedure(s) were conducted as a shared visit with non-physician practitioner(s) and myself.  I personally evaluated the patient during the encounter   Daleen Bo, MD 08/07/16 [phone]

## 2016-08-02 ENCOUNTER — Encounter: Payer: Medicaid Other | Admitting: Internal Medicine

## 2016-08-02 LAB — T-HELPER CELLS (CD4) COUNT (NOT AT ARMC)
CD4 T CELL HELPER: 7 % — AB (ref 33–55)
CD4 T Cell Abs: 130 /uL — ABNORMAL LOW (ref 400–2700)

## 2016-08-07 ENCOUNTER — Other Ambulatory Visit (HOSPITAL_COMMUNITY)
Admission: RE | Admit: 2016-08-07 | Discharge: 2016-08-07 | Disposition: A | Discharge status: Home / Self Care | Payer: Medicaid Other | Source: Ambulatory Visit | Attending: Infectious Diseases | Admitting: Infectious Diseases

## 2016-08-07 ENCOUNTER — Other Ambulatory Visit: Payer: Medicaid Other

## 2016-08-07 ENCOUNTER — Other Ambulatory Visit: Payer: Self-pay | Admitting: Surgery

## 2016-08-07 DIAGNOSIS — Z113 Encounter for screening for infections with a predominantly sexual mode of transmission: Secondary | ICD-10-CM

## 2016-08-07 DIAGNOSIS — L732 Hidradenitis suppurativa: Secondary | ICD-10-CM | POA: Diagnosis not present

## 2016-08-07 DIAGNOSIS — Z79899 Other long term (current) drug therapy: Secondary | ICD-10-CM | POA: Insufficient documentation

## 2016-08-07 DIAGNOSIS — B20 Human immunodeficiency virus [HIV] disease: Secondary | ICD-10-CM

## 2016-08-07 LAB — CBC
HCT: 35.7 % — ABNORMAL LOW (ref 38.5–50.0)
Hemoglobin: 11.4 g/dL — ABNORMAL LOW (ref 13.2–17.1)
MCH: 31.5 pg (ref 27.0–33.0)
MCHC: 31.9 g/dL — AB (ref 32.0–36.0)
MCV: 98.6 fL (ref 80.0–100.0)
MPV: 9.2 fL (ref 7.5–12.5)
PLATELETS: 279 10*3/uL (ref 140–400)
RBC: 3.62 MIL/uL — ABNORMAL LOW (ref 4.20–5.80)
RDW: 14.9 % (ref 11.0–15.0)
WBC: 7.3 10*3/uL (ref 3.8–10.8)

## 2016-08-07 LAB — COMPLETE METABOLIC PANEL WITH GFR
ALT: 5 U/L — ABNORMAL LOW (ref 9–46)
AST: 11 U/L (ref 10–35)
Albumin: 3.4 g/dL — ABNORMAL LOW (ref 3.6–5.1)
Alkaline Phosphatase: 62 U/L (ref 40–115)
BUN: 13 mg/dL (ref 7–25)
CHLORIDE: 111 mmol/L — AB (ref 98–110)
CO2: 26 mmol/L (ref 20–31)
Calcium: 8.7 mg/dL (ref 8.6–10.3)
Creat: 1.54 mg/dL — ABNORMAL HIGH (ref 0.70–1.33)
GFR, Est African American: 57 mL/min — ABNORMAL LOW (ref >=60)
GFR, Est Non African American: 49 mL/min — ABNORMAL LOW (ref >=60)
Glucose, Bld: 95 mg/dL (ref 65–99)
POTASSIUM: 4.6 mmol/L (ref 3.5–5.3)
SODIUM: 145 mmol/L (ref 135–146)
Total Bilirubin: 0.3 mg/dL (ref 0.2–1.2)
Total Protein: 7.6 g/dL (ref 6.1–8.1)

## 2016-08-07 LAB — LIPID PANEL
CHOL/HDL RATIO: 2.3 ratio (ref <5.0)
CHOLESTEROL: 121 mg/dL (ref <200)
HDL: 53 mg/dL (ref >40)
LDL Cholesterol: 52 mg/dL (ref <100)
Triglycerides: 82 mg/dL (ref <150)
VLDL: 16 mg/dL (ref <30)

## 2016-08-08 ENCOUNTER — Ambulatory Visit (HOSPITAL_COMMUNITY): Admission: RE | Admit: 2016-08-08 | Payer: Medicaid Other | Source: Ambulatory Visit

## 2016-08-08 LAB — URINE CYTOLOGY ANCILLARY ONLY
CHLAMYDIA, DNA PROBE: NEGATIVE
Neisseria Gonorrhea: NEGATIVE

## 2016-08-08 LAB — T-HELPER CELL (CD4) - (RCID CLINIC ONLY)
CD4 T CELL HELPER: 8 % — AB (ref 33–55)
CD4 T Cell Abs: 160 /uL — ABNORMAL LOW (ref 400–2700)

## 2016-08-08 LAB — RPR

## 2016-08-09 LAB — QUANTIFERON TB GOLD ASSAY (BLOOD)
Interferon Gamma Release Assay: NEGATIVE
Mitogen-Nil: 9.05 IU/mL
QUANTIFERON TB AG MINUS NIL: 0.01 [IU]/mL
Quantiferon Nil Value: 0.05 IU/mL

## 2016-08-09 LAB — HIV-1 RNA QUANT-NO REFLEX-BLD
HIV 1 RNA QUANT: NOT DETECTED {copies}/mL
HIV-1 RNA QUANT, LOG: NOT DETECTED {Log_copies}/mL

## 2016-08-15 ENCOUNTER — Ambulatory Visit (HOSPITAL_COMMUNITY): Payer: Medicaid Other

## 2016-08-19 ENCOUNTER — Encounter (INDEPENDENT_AMBULATORY_CARE_PROVIDER_SITE_OTHER): Payer: Self-pay | Admitting: Orthopedic Surgery

## 2016-08-19 ENCOUNTER — Ambulatory Visit (INDEPENDENT_AMBULATORY_CARE_PROVIDER_SITE_OTHER): Payer: Medicaid Other

## 2016-08-19 ENCOUNTER — Ambulatory Visit (INDEPENDENT_AMBULATORY_CARE_PROVIDER_SITE_OTHER): Payer: Medicaid Other | Admitting: Orthopedic Surgery

## 2016-08-19 VITALS — Ht 72.0 in | Wt 310.0 lb

## 2016-08-19 DIAGNOSIS — I825Z2 Chronic embolism and thrombosis of unspecified deep veins of left distal lower extremity: Secondary | ICD-10-CM | POA: Insufficient documentation

## 2016-08-19 DIAGNOSIS — M87052 Idiopathic aseptic necrosis of left femur: Secondary | ICD-10-CM

## 2016-08-19 DIAGNOSIS — M25552 Pain in left hip: Secondary | ICD-10-CM

## 2016-08-19 DIAGNOSIS — Z6841 Body Mass Index (BMI) 40.0 and over, adult: Secondary | ICD-10-CM | POA: Insufficient documentation

## 2016-08-19 NOTE — Progress Notes (Signed)
Office Visit Note   Patient: Allen Espinoza           Date of Birth: [birth year]           MRN: [id number] Visit Date: 08/19/2016              Requested by: Campbell Riches, MD [address] [place], [place] [address] PCP: Asencion Partridge, MD  Chief Complaint  Patient presents with  . Left Hip - Pain      HPI:  patient is a 56 year old gentleman morbidly obese , BMI greater than 40, with chronic DVT in the left leg currently on Xarelto also has a vena cava filter. Patient states that he just moved here from [place]. Patient has had chronic avascular necrosis of the left hip. Patient also has chronic hydradenitis that he is going to undergo general surgery of the axilla with Dr. Ninfa Linden. Patient states he also has hydradenitis of his groin. Patient's past medical history is also positive for HIV positive and kidney disease.  Assessment & Plan: Visit Diagnoses:  1. Pain in left hip   2. Avascular necrosis of bone of hip, left ([place])   3. 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 Morbid (severe) obesity due to excess calories ([place])     Plan:  Recommended weight loss discussed that we would have to resolve the hydradenitis  of the groin region prior to considering total hip arthroplasty. Discussed the importance of obtaining compression stockings and he will obtain these at [facility] discount medical.  Recommended using a cane or a walker and until he can undergo surgery. Discussed that with all of his medical problems he is an increased risk with surgical intervention. Patient will follow up with Dr. Ninfa Linden in 4 weeks  Follow-Up Instructions: Return in about 4 weeks (around 09/16/2016).   Ortho Exam  Patient is alert, oriented, no adenopathy, well-dressed, normal affect, normal respiratory effort.  examination he has an abductor lurch with an antalgic gait. Patient has essentially no internal or external rotation of the  left hip. Radiographs show complete collapse of the joint space.  Imaging: Xr Hip Unilat W Or W/o Pelvis 2-3 Views Left  Result Date: 08/19/2016  Two-view radiographs of the left hip shows avascular necrosis with bone-on-bone contact large osteophytic bone spurs there is no collapse of the head. There are subcondylar cysts.   Labs: Lab Results  Component Value Date   HGBA1C 5.0 04/27/2016   REPTSTATUS 05/02/2016 FINAL 04/26/2016   GRAMSTAIN  04/26/2016    FEW SQUAMOUS EPITHELIAL CELLS PRESENT MODERATE WBC PRESENT,BOTH PMN AND MONONUCLEAR FEW GRAM POSITIVE COCCI IN PAIRS FEW GRAM NEGATIVE RODS FEW GRAM NEGATIVE COCCI    CULT  04/26/2016    FEW ESCHERICHIA COLI ABUNDANT NON-GROUPABLE BETA STREPTOCOCCUS MIXED ANAEROBIC FLORA PRESENT.  CALL LAB IF FURTHER IID REQUIRED.    LABORGA ESCHERICHIA COLI 04/26/2016    Orders:  Orders Placed This Encounter  Procedures  . XR HIP UNILAT W OR W/O PELVIS 2-3 VIEWS LEFT   No orders of the defined types were placed in this encounter.    Procedures: No procedures performed  Clinical Data: No additional findings.  ROS:  All other systems negative, except as noted in the HPI. Review of Systems  Objective: Vital Signs: Ht 6' (1.829 m)   Wt (!) 310 lb (140.6 kg)   BMI 42.04 kg/m   Specialty Comments:  No specialty comments available.  PMFS History: Patient Active Problem List   Diagnosis Date Noted  . 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 Morbid (severe) obesity due to excess calories ([place]) 08/19/2016  . Pain in left hip 08/19/2016  . Solitary pulmonary nodule 05/06/2016  . Hidradenitis suppurativa 04/27/2016  . Chest pain 04/26/2016  . Avascular necrosis of bone of hip, left ([place]) 03/19/2016  . Human immunodeficiency virus (HIV) disease ([place]) 03/18/2016  . DVT (deep venous thrombosis) ([place]) 03/18/2016  . Axillary abscess 03/18/2016  . Chronic kidney disease  03/18/2016  . Encounter for immunization 03/18/2016  . Cholelithiasis 03/18/2016  . Diverticulosis of colon without hemorrhage 03/18/2016   Past Medical History:  Diagnosis Date  . Abscess   . Avascular necrosis of femoral head, left ([place])   . Cholelithiasis   . CKD (chronic kidney disease) stage 3, GFR 30-59 ml/min   . DVT (deep venous thrombosis) ([place])   . HIV (human immunodeficiency virus infection) ([place])   . Morbid obesity ([place])     Family History  Problem Relation Age of Onset  . Heart attack Mother   . Heart attack Father     No past surgical history on file. Social History   Occupational History  . Not on file.   Social History Main Topics  . Smoking status: Never Smoker  . Smokeless tobacco: Never Used  . Alcohol use No     Comment: prior  . Drug use: No     Comment: prior cocaine use, last 2005  . Sexual activity: Yes    Partners: Female    Birth control/ protection: Condom

## 2016-08-20 ENCOUNTER — Telehealth: Payer: Self-pay | Admitting: *Deleted

## 2016-08-20 ENCOUNTER — Encounter: Payer: Self-pay | Admitting: Internal Medicine

## 2016-08-20 ENCOUNTER — Ambulatory Visit (INDEPENDENT_AMBULATORY_CARE_PROVIDER_SITE_OTHER): Payer: Medicaid Other | Admitting: Internal Medicine

## 2016-08-20 VITALS — BP 142/80 | HR 64 | Temp 98.4°F | Ht 72.0 in | Wt 319.5 lb

## 2016-08-20 DIAGNOSIS — N183 Chronic kidney disease, stage 3 (moderate): Secondary | ICD-10-CM | POA: Diagnosis not present

## 2016-08-20 DIAGNOSIS — R609 Edema, unspecified: Secondary | ICD-10-CM | POA: Insufficient documentation

## 2016-08-20 DIAGNOSIS — R600 Localized edema: Secondary | ICD-10-CM | POA: Diagnosis present

## 2016-08-20 DIAGNOSIS — L732 Hidradenitis suppurativa: Secondary | ICD-10-CM | POA: Diagnosis not present

## 2016-08-20 LAB — HLA B*5701: HLA-B 5701 W/RFLX HLA-B HIGH: POSITIVE — AB

## 2016-08-20 MED ORDER — FUROSEMIDE 20 MG PO TABS: 20.0000 mg | ORAL_TABLET | Freq: Every day | ORAL | 1 refills | Status: DC

## 2016-08-20 NOTE — Assessment & Plan Note (Signed)
The patient has significant hidradenitis suppurativa of his groin bilaterally as well as his left axilla. Examination of his groin shows multiple areas of excoriation and erythema consistent with the diagnosis of hidradenitis suppurativa. He is currently followed in a clinic and has plans to hopefully initiate Humira pending improvement of his CD4 count. At this time I encouraged the patient to continue with his antiretroviral medications and following with the infectious disease clinic. Once his CD4 count is over 200 he may be considered a candidate to start Humira. We will continue to follow along with this condition. There is no evidence of active infection or abscess that would require incision and drainage or antimicrobial therapy at this time. -- Continue to monitor -- Encouraged follow-up with infectious disease -- Encouraged follow-up with specialty clinic

## 2016-08-20 NOTE — Telephone Encounter (Signed)
Error

## 2016-08-20 NOTE — Assessment & Plan Note (Addendum)
Patient presents with worsening bilateral lower extremity edema. The patient says that his edema has been on and off for the past several months however over the last 2 weeks it is progressed and gotten significantly worse. On chart review it appears that at his prior emergency department visit the provider documented that he had no lower extremity edema making his presentation today consistent with more acute edema. Associated with this edema the patient endorses worsening orthopnea and dyspnea on exertion. Physical examination shows marked edema of the lower extremities bilaterally. The edema is pitting and extends superiorly to the patella. Pulmonary examination shows no crackles. No JVD appreciated. Prior echocardiogram in the system shows good ejection fraction with grade 1 diastolic dysfunction. Patient also has a history of CKD stage III. Currently, the differential diagnosis for the patient's lower extremity edema, orthopnea and dyspnea on exertion includes heart failure, worsening kidney disease, venous stasis or other potential etiologies. However, given his symptoms and physical examination I am most concerned about progressive heart failure versus worsening kidney function. At this time I will proceed with echocardiogram as well as a basic metabolic panel. I will start the patient on Lasix 20 mg once daily and have him return to clinic in 2 weeks for repeat BMP and for clinical assessment. Important, given his chronic kidney disease and creatinine we will carefully monitor his creatinine with the addition of diuresis. Part of his renal function may be secondary to decreased renal perfusion in the setting of heart failure and may improve with starting furosemide therapy. However, we will start the patient on the low dose of 20 mg and titrate as necessary based on clinical examination and laboratory evaluation. -- Basic metabolic panel -- Echocardiogram -- Furosemide 20 mg once daily -- Return to  clinic in 2 weeks for assessment  ADDENDUM Patients Cr slightly increased after starting lasix. Consider re-checking at upcoming appointment in June.

## 2016-08-20 NOTE — Progress Notes (Signed)
Made in error

## 2016-08-20 NOTE — Progress Notes (Signed)
   CC: Left leg swelling HPI: [name] is a 56 y.o. male with a past medical history as listed below who presents with leg swelling and hidradenitis suppurativa.  Past Medical History:  Diagnosis Date  . Abscess   . Avascular necrosis of femoral head, left ([place])   . Cholelithiasis   . CKD (chronic kidney disease) stage 3, GFR 30-59 ml/min   . DVT (deep venous thrombosis) ([place])   . HIV (human immunodeficiency virus infection) ([place])   . Morbid obesity ([place])      Review of Systems:  Physical Exam: Vitals:   08/20/16 1507  BP: (!) 142/80  Pulse: 64  Temp: 98.4 F (36.9 C)  TempSrc: Oral  SpO2: 100%  Weight: (!) 319 lb 8 oz (144.9 kg)  Height: 6' (1.829 m)   BP (!) 142/80 (BP Location: Right Arm, Patient Position: Sitting, Cuff Size: Normal)   Pulse 64   Temp 98.4 F (36.9 C) (Oral)   Ht 6' (1.829 m)   Wt (!) 319 lb 8 oz (144.9 kg)   SpO2 100% Comment: room air  BMI 43.33 kg/m  General appearance: alert and cooperative Lungs: clear to auscultation bilaterally Heart: regular rate and rhythm, S1, S2 normal, no murmur, click, rub or gallop Abdomen: soft, non-tender; bowel sounds normal; no masses,  no organomegaly Extremities: Bilateral lower extremity edema extending beyond the superior aspect of the patella. For additional documentation of lower extremity edema and hidradenitis up routine visit please see under individual assessment and plan.  Assessment & Plan:  See encounters tab for problem based medical decision making. Patient seen with Dr. Eppie Gibson  Signed: Ophelia Shoulder, MD 08/20/2016, 3:23 PM  Pager: [phone]

## 2016-08-20 NOTE — Patient Instructions (Signed)
It was a pleasure seeing you today. Thank you for choosing [facility] for your healthcare needs.   I will order basic blood work today. I will call you with the results.  I have placed an order to have an echocardiogram which is a test to look at your heart.  I have started a new medicine called Lasix. This is also known as a fluid pill and will caution due to use the restroom more frequently. You're to take 1 pill once daily.  Please return to clinic in 2 weeks to see how your swelling is doing and to repeat blood work.

## 2016-08-21 ENCOUNTER — Ambulatory Visit (INDEPENDENT_AMBULATORY_CARE_PROVIDER_SITE_OTHER): Payer: Medicaid Other | Admitting: Infectious Diseases

## 2016-08-21 ENCOUNTER — Encounter: Payer: Self-pay | Admitting: Infectious Diseases

## 2016-08-21 ENCOUNTER — Telehealth: Payer: Self-pay | Admitting: *Deleted

## 2016-08-21 VITALS — BP 165/82 | HR 65 | Temp 97.8°F | Ht 72.0 in | Wt 316.0 lb

## 2016-08-21 DIAGNOSIS — I825Z2 Chronic embolism and thrombosis of unspecified deep veins of left distal lower extremity: Secondary | ICD-10-CM

## 2016-08-21 DIAGNOSIS — L732 Hidradenitis suppurativa: Secondary | ICD-10-CM | POA: Diagnosis not present

## 2016-08-21 DIAGNOSIS — B20 Human immunodeficiency virus [HIV] disease: Secondary | ICD-10-CM

## 2016-08-21 LAB — BMP8+ANION GAP
Anion Gap: 15 mmol/L (ref 10.0–18.0)
BUN / CREAT RATIO: 10 (ref 9–20)
BUN: 16 mg/dL (ref 6–24)
CHLORIDE: 106 mmol/L (ref 96–106)
CO2: 22 mmol/L (ref 18–29)
Calcium: 8.8 mg/dL (ref 8.7–10.2)
Creatinine, Ser: 1.55 mg/dL — ABNORMAL HIGH (ref 0.76–1.27)
GFR calc non Af Amer: 49 mL/min/{1.73_m2} — ABNORMAL LOW (ref >59)
GFR, EST AFRICAN AMERICAN: 57 mL/min/{1.73_m2} — AB (ref >59)
GLUCOSE: 93 mg/dL (ref 65–99)
POTASSIUM: 3.7 mmol/L (ref 3.5–5.2)
Sodium: 143 mmol/L (ref 134–144)

## 2016-08-21 MED ORDER — CLINDAMYCIN PHOSPHATE 1 % EX GEL: Freq: Two times a day (BID) | CUTANEOUS | 0 refills | Status: DC

## 2016-08-21 MED ORDER — CLINDAMYCIN HCL 300 MG PO CAPS: 300.0000 mg | ORAL_CAPSULE | Freq: Three times a day (TID) | ORAL | 1 refills | Status: DC

## 2016-08-21 NOTE — Telephone Encounter (Signed)
PA needed for clindamycin 1% gel, completed and faxed.

## 2016-08-21 NOTE — Assessment & Plan Note (Signed)
Appreciate [facility] f/u.  Will clear clinda use with pharm

## 2016-08-21 NOTE — Assessment & Plan Note (Signed)
Will check Cx of exudate Will start on clinda Appreciate surgery and derm f/u I am not sure how much we can count on his CD4 to rise.

## 2016-08-21 NOTE — Progress Notes (Signed)
   Subjective:    Patient ID: Allen Espinoza, male    DOB: 1961-02-10, 56 y.o.   MRN: [id number]  HPI 56 yo M with hx of HIV+ (dx 2007 when he was DC). Taking genvoya/prezista. He was adm 1-26 to 04-27-16 with chest pain. He was ruled out for ACS.  He also has a hx of hidradenitis suppurative. He has lesions in groin and L axilla.  He was eval by surgery, states they are waiting for his CD4 to improve.  Is having persistent d/c in his groin wound, less in his axilla.  He was put on keflex and doxy at recent ED visit, this improved his wound swelling and d/c. Has worsened as he completed 1 week ago.  He has been seen by Derm and gerneral surgery.  Is also on xaralto for DVT in BLE. Has persistent LE swelling.   He was seen at [facility] yesterday, started on lasix. Being eval for CHF as well.  HIV 1 RNA Quant (copies/mL)  Date Value  08/07/2016 <20 NOT DETECTED  04/26/2016 30   HIV-1 RNA Viral Load (copies/mL)  Date Value  03/18/2016 47,300   CD4 T Cell Abs (/uL)  Date Value  08/07/2016 160 (L)  08/01/2016 130 (L)  04/26/2016 200 (L)    Review of Systems  Constitutional: Positive for unexpected weight change. Negative for appetite change, chills and fever.  Respiratory: Positive for shortness of breath.   Gastrointestinal: Negative for blood in stool, constipation and diarrhea.  Genitourinary: Negative for difficulty urinating.  Skin: Positive for wound.  DOE Wt up.  Please see HPI. 12 point ROS o/w (-)     Objective:   Physical Exam  Constitutional: He appears well-developed and well-nourished.  HENT:  Mouth/Throat: No oropharyngeal exudate.  Eyes: EOM are normal. Pupils are equal, round, and reactive to light.  Neck: Neck supple.  Cardiovascular: Normal rate, regular rhythm and normal heart sounds.   Pulmonary/Chest: Effort normal and breath sounds normal.  Abdominal: Soft. Bowel sounds are normal. There is no tenderness. There is no rebound.  Musculoskeletal: He  exhibits edema.  Lymphadenopathy:    He has no cervical adenopathy.  Skin:             Assessment & Plan:

## 2016-08-21 NOTE — Assessment & Plan Note (Signed)
Will continue his art His fiance is here with him- they are are not sexually active.  He has aged out of mening vax.  Will see him back in 3 months.

## 2016-08-21 NOTE — Progress Notes (Signed)
I saw and evaluated the patient.  I personally confirmed the key portions of Dr. Taylor's history and exam and reviewed pertinent patient test results.  The assessment, diagnosis, and plan were formulated together and I agree with the documentation in the resident's note. 

## 2016-08-22 ENCOUNTER — Ambulatory Visit: Payer: Medicaid Other

## 2016-08-22 ENCOUNTER — Telehealth: Payer: Self-pay | Admitting: Internal Medicine

## 2016-08-22 DIAGNOSIS — I10 Essential (primary) hypertension: Secondary | ICD-10-CM

## 2016-08-22 NOTE — Telephone Encounter (Signed)
Patient called and informed of lab results. He will return to clinic tomorrow for repeat BMP. If Cr stable he will continue lasix. If elevated we will most likely d/c medication.

## 2016-08-23 ENCOUNTER — Other Ambulatory Visit (INDEPENDENT_AMBULATORY_CARE_PROVIDER_SITE_OTHER): Payer: Medicaid Other

## 2016-08-23 DIAGNOSIS — I10 Essential (primary) hypertension: Secondary | ICD-10-CM | POA: Diagnosis present

## 2016-08-24 LAB — BMP8+ANION GAP
Anion Gap: 15 mmol/L (ref 10.0–18.0)
BUN / CREAT RATIO: 8 — AB (ref 9–20)
BUN: 13 mg/dL (ref 6–24)
CALCIUM: 8.9 mg/dL (ref 8.7–10.2)
CO2: 23 mmol/L (ref 18–29)
CREATININE: 1.6 mg/dL — AB (ref 0.76–1.27)
Chloride: 107 mmol/L — ABNORMAL HIGH (ref 96–106)
GFR calc non Af Amer: 47 mL/min/{1.73_m2} — ABNORMAL LOW (ref >59)
GFR, EST AFRICAN AMERICAN: 54 mL/min/{1.73_m2} — AB (ref >59)
GLUCOSE: 98 mg/dL (ref 65–99)
Potassium: 4.3 mmol/L (ref 3.5–5.2)
Sodium: 145 mmol/L — ABNORMAL HIGH (ref 134–144)

## 2016-08-25 LAB — CULTURE, ROUTINE-ABSCESS
Gram Stain: NONE SEEN
Gram Stain: NONE SEEN
Gram Stain: NONE SEEN

## 2016-08-27 ENCOUNTER — Telehealth: Payer: Self-pay | Admitting: Infectious Diseases

## 2016-08-27 DIAGNOSIS — L732 Hidradenitis suppurativa: Secondary | ICD-10-CM

## 2016-08-27 MED ORDER — LEVOFLOXACIN 500 MG PO TABS: 500.0000 mg | ORAL_TABLET | Freq: Every day | ORAL | 0 refills | Status: DC

## 2016-08-27 NOTE — Telephone Encounter (Signed)
Patient notified and will pick up new medication today. He knows to stop the Clindamycin.

## 2016-08-27 NOTE — Telephone Encounter (Signed)
Pt called, anbx switched due to anbc/cx mismatch change to levaquin 500mg  po qday for 10 days

## 2016-08-30 ENCOUNTER — Other Ambulatory Visit: Payer: Self-pay | Admitting: Nephrology

## 2016-08-30 DIAGNOSIS — R600 Localized edema: Secondary | ICD-10-CM | POA: Diagnosis not present

## 2016-08-30 DIAGNOSIS — L732 Hidradenitis suppurativa: Secondary | ICD-10-CM | POA: Diagnosis not present

## 2016-08-30 DIAGNOSIS — M87052 Idiopathic aseptic necrosis of left femur: Secondary | ICD-10-CM | POA: Diagnosis not present

## 2016-08-30 DIAGNOSIS — D638 Anemia in other chronic diseases classified elsewhere: Secondary | ICD-10-CM | POA: Diagnosis not present

## 2016-08-30 DIAGNOSIS — N183 Chronic kidney disease, stage 3 unspecified: Secondary | ICD-10-CM

## 2016-08-30 DIAGNOSIS — I519 Heart disease, unspecified: Secondary | ICD-10-CM | POA: Diagnosis not present

## 2016-08-30 DIAGNOSIS — I82409 Acute embolism and thrombosis of unspecified deep veins of unspecified lower extremity: Secondary | ICD-10-CM | POA: Diagnosis not present

## 2016-08-30 DIAGNOSIS — B20 Human immunodeficiency virus [HIV] disease: Secondary | ICD-10-CM | POA: Diagnosis not present

## 2016-09-02 ENCOUNTER — Encounter: Payer: Self-pay | Admitting: Licensed Clinical Social Worker

## 2016-09-03 ENCOUNTER — Encounter: Payer: Self-pay | Admitting: Internal Medicine

## 2016-09-03 ENCOUNTER — Ambulatory Visit
Admission: RE | Admit: 2016-09-03 | Discharge: 2016-09-03 | Disposition: A | Discharge status: Home / Self Care | Payer: Medicaid Other | Source: Ambulatory Visit | Attending: Nephrology | Admitting: Nephrology

## 2016-09-03 ENCOUNTER — Ambulatory Visit (INDEPENDENT_AMBULATORY_CARE_PROVIDER_SITE_OTHER): Payer: Medicaid Other | Admitting: Internal Medicine

## 2016-09-03 ENCOUNTER — Other Ambulatory Visit: Payer: Medicaid Other

## 2016-09-03 VITALS — BP 127/71 | HR 62 | Temp 97.9°F | Wt 303.2 lb

## 2016-09-03 DIAGNOSIS — I82409 Acute embolism and thrombosis of unspecified deep veins of unspecified lower extremity: Secondary | ICD-10-CM

## 2016-09-03 DIAGNOSIS — L732 Hidradenitis suppurativa: Secondary | ICD-10-CM

## 2016-09-03 DIAGNOSIS — N183 Chronic kidney disease, stage 3 unspecified: Secondary | ICD-10-CM

## 2016-09-03 DIAGNOSIS — Z7901 Long term (current) use of anticoagulants: Secondary | ICD-10-CM | POA: Diagnosis not present

## 2016-09-03 DIAGNOSIS — B20 Human immunodeficiency virus [HIV] disease: Secondary | ICD-10-CM | POA: Diagnosis not present

## 2016-09-03 DIAGNOSIS — R600 Localized edema: Secondary | ICD-10-CM | POA: Diagnosis present

## 2016-09-03 DIAGNOSIS — Z79899 Other long term (current) drug therapy: Secondary | ICD-10-CM | POA: Diagnosis not present

## 2016-09-03 DIAGNOSIS — I824Y2 Acute embolism and thrombosis of unspecified deep veins of left proximal lower extremity: Secondary | ICD-10-CM

## 2016-09-03 DIAGNOSIS — R7881 Bacteremia: Secondary | ICD-10-CM | POA: Diagnosis not present

## 2016-09-03 NOTE — Assessment & Plan Note (Signed)
He has stage III chronic kidney disease, most recent creatinine done on 08/23/2016 was 1.60-mildly increased from his previous number of 1.55.  He came here to repeat BMP, as he was started on low dose Lasix because of his worsening lower extremity swelling.  -Repeat BMP today. -We will call the patient with the results.

## 2016-09-03 NOTE — Assessment & Plan Note (Signed)
He follow up at infectious disease clinic regularly. Currently on  Genvoya. His last viral load was undetectable with CD count of 160.  -Continue Genvoya as directed and follow-up with infectious disease.

## 2016-09-03 NOTE — Patient Instructions (Addendum)
Thank you for visiting clinic today. I am glad your swelling is improving. Continue to take your antibiotics as directed by your infectious disease doctor. We will call you with your lab results.

## 2016-09-03 NOTE — Assessment & Plan Note (Addendum)
His lower extremity edema is improving with the use of Lasix. He has a decrease of 13 pound in his weight over 2 weeks. He denies any orthopnea, PND or chest pain. He can walk up to 1 block on flat service and go 1 flight of stairs without being dyspneic, according to patient going upstairs is little difficult because of his left groin pain. He mostly become dyspneic while going up the hill. Going for his repeat echo today.  On exam. Having 1+ pitting edema up to mid legs.  -Continue current dose of Lasix 20 mg daily. -Repeat BMP  Addendum. His kidney functions continued to get worse and since the start of Lasix. Creatinine was 1.85 yesterday. I called the patient and ask him to stop taking Lasix daily, he can use it as a when necessary basis. I also advised him to use compression stockings with ambulation. He will need to come back in 2 weeks for a repeat BMP.

## 2016-09-03 NOTE — Assessment & Plan Note (Signed)
His still have active disease. No new lesions requiring incision and drainage. He was recently started on levofloxacin by infectious disease according to culture and sensitivity result of his wound, which was positive for Pseudomonas aeruginosa.  Still waiting for his CD count to go above 200 to be considered for Humira. Last CD count on 08/07/2016 was 160.  -He was advised to continue levofloxacin as directed by infectious disease.

## 2016-09-03 NOTE — Assessment & Plan Note (Signed)
No current exacerbation of symptoms suggesting a reoccurrence of DVT.  He is compliant with his Xarelto.  -Continue Xarelto.

## 2016-09-03 NOTE — Progress Notes (Addendum)
   CC: For follow-up of his BMP after the start of Lasix.  HPI:  Allen Espinoza is a 56 y.o. with past medical history as listed below came to the clinic for follow-up of his BMP Since the start of low-dose Lasix 2 weeks ago. Cording to patient his lower extremity swelling has been improved. He lost about 13 pounds in 2 weeks. He was still complaining of exertional dyspnea especially when walking up the hill, he is able to walk up to 1 block without being dyspneic, he can go 1 flight upstairs, denies any dyspnea but states that his leg pain interfere with walking upstairs. He denies any orthopnea or PND. He denies any chest pain, fever or chills.  His axillary and groin and the lesions are still having bloody discharge. He is compliant with his antibiotics, which were recently changed to levofloxacin according to culture and sensitivity report by infectious disease clinic. He denies any new lesion. He was complaining that his left groin lesion causing the most pain currently.  Addendum. His kidney functions continued to get worse and since the start of Lasix. Creatinine was 1.85 yesterday. I called the patient and ask him to stop taking Lasix daily, he can use it as a when necessary basis. I also advised him to use compression stockings with ambulation. He will need to come back in 2 weeks for a repeat BMP.  Past Medical History:  Diagnosis Date  . Abscess   . Avascular necrosis of femoral head, left ([place])   . Cholelithiasis   . CKD (chronic kidney disease) stage 3, GFR 30-59 ml/min   . DVT (deep venous thrombosis) ([place])   . HIV (human immunodeficiency virus infection) ([place])   . Morbid obesity ([place])     Review of Systems:  As per HPI.  Physical Exam:  Vitals:   09/03/16 0946  BP: 127/71  Pulse: 62  Temp: 97.9 F (36.6 C)  TempSrc: Oral  SpO2: 100%  Weight: (!) 303 lb 3.2 oz (137.5 kg)    General: Vital signs reviewed.  Patient is well-developed and Morbidly obese, in no  acute distress and cooperative with exam.  Head: Normocephalic and atraumatic. Neck: Supple, trachea midline, normal ROM, no JVD, masses, thyromegaly, or carotid bruit present.  Cardiovascular: RRR, S1 normal, S2 normal, no murmurs, gallops, or rubs. Pulmonary/Chest: Clear to auscultation bilaterally, no wheezes, rales, or rhonchi. Abdominal: Soft, non-tender, obese,BS +,  Extremities: 1+ lower extremity edema bilaterally up to mid legs, pulses symmetric and intact bilaterally. No cyanosis or clubbing. Skin: Multiple healed and healing, weeping and crusty wounds in left axilla and bilateral groin area, no active abscess requiring incision and drainage currently. Psychiatric: Normal mood and affect. speech and behavior is normal. Cognition and memory are normal.  Assessment & Plan:   See Encounters Tab for problem based charting.  Patient discussed with Dr. Lynnae January.

## 2016-09-04 ENCOUNTER — Encounter: Payer: Self-pay | Admitting: Internal Medicine

## 2016-09-04 LAB — BMP8+ANION GAP
Anion Gap: 16 mmol/L (ref 10.0–18.0)
BUN / CREAT RATIO: 9 (ref 9–20)
BUN: 16 mg/dL (ref 6–24)
CHLORIDE: 100 mmol/L (ref 96–106)
CO2: 25 mmol/L (ref 18–29)
CREATININE: 1.85 mg/dL — AB (ref 0.76–1.27)
Calcium: 9.1 mg/dL (ref 8.7–10.2)
GFR calc Af Amer: 46 mL/min/{1.73_m2} — ABNORMAL LOW (ref >59)
GFR calc non Af Amer: 40 mL/min/{1.73_m2} — ABNORMAL LOW (ref >59)
GLUCOSE: 107 mg/dL — AB (ref 65–99)
POTASSIUM: 4 mmol/L (ref 3.5–5.2)
SODIUM: 141 mmol/L (ref 134–144)

## 2016-09-04 NOTE — Progress Notes (Signed)
Internal Medicine Clinic Attending  Case discussed with Dr. Amin at the time of the visit.  We reviewed the resident's history and exam and pertinent patient test results.  I agree with the assessment, diagnosis, and plan of care documented in the resident's note.    

## 2016-09-09 ENCOUNTER — Encounter: Payer: Self-pay | Admitting: *Deleted

## 2016-09-13 IMAGING — US US RENAL
1 series · 14 of 25 positions shown · non-contrast
Comparison: None.

CLINICAL DATA: Chronic renal disease

EXAM:
RENAL / URINARY TRACT ULTRASOUND COMPLETE

[Series 1: us renal · 0.16mm/px · 14 of 36 slices shown]
[im 1/36]
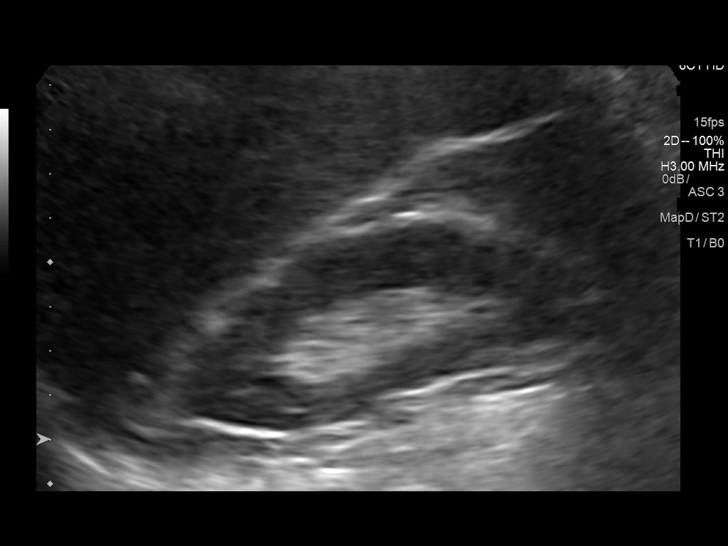
[im 3/36]
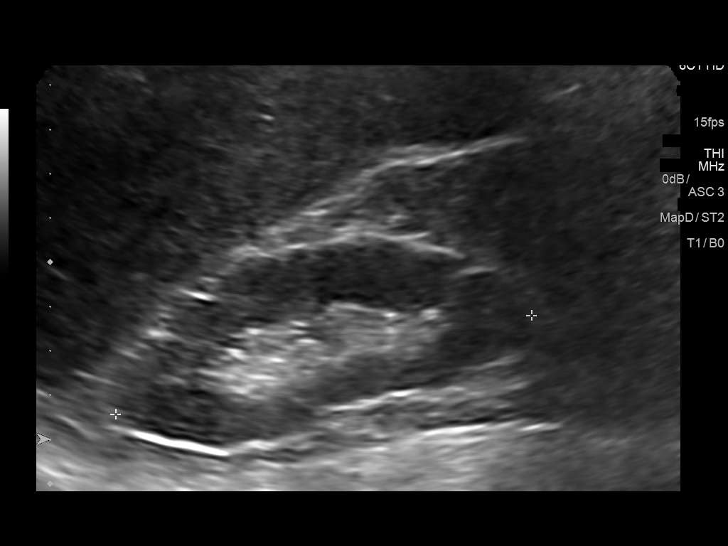
[im 6/36]
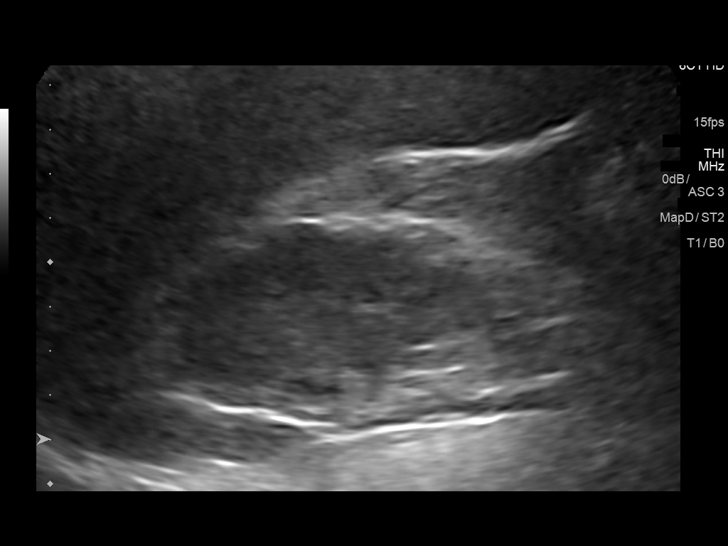
[im 9/36]
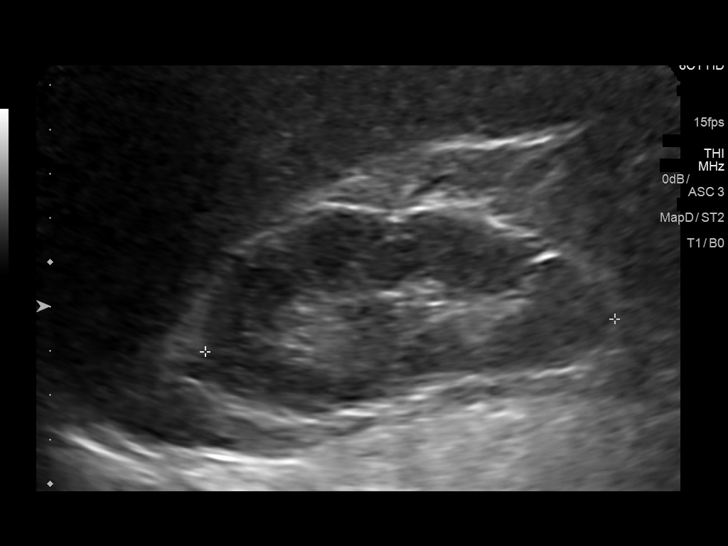
[im 12/36]
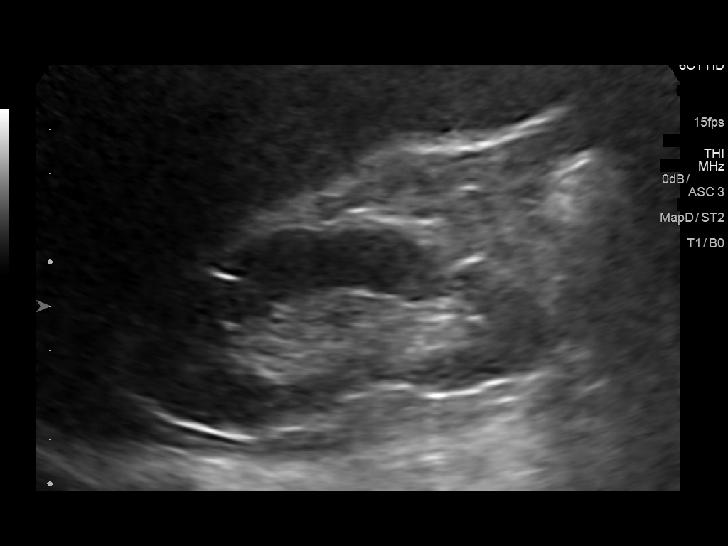
[im 14/36]
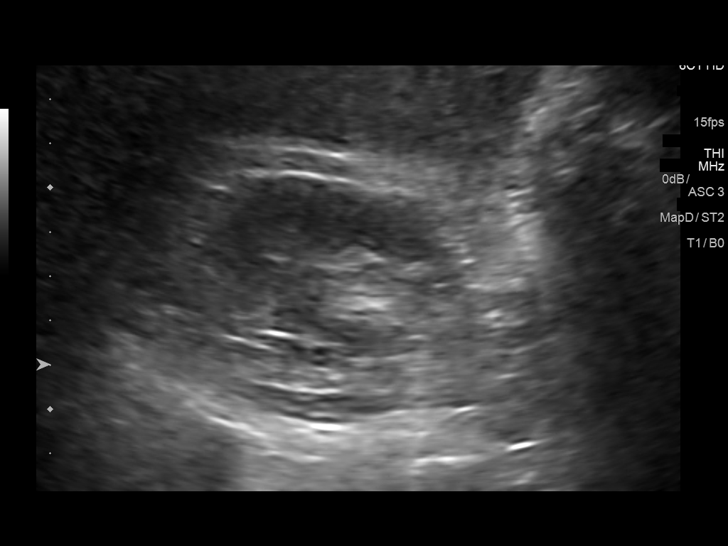
[im 17/36]
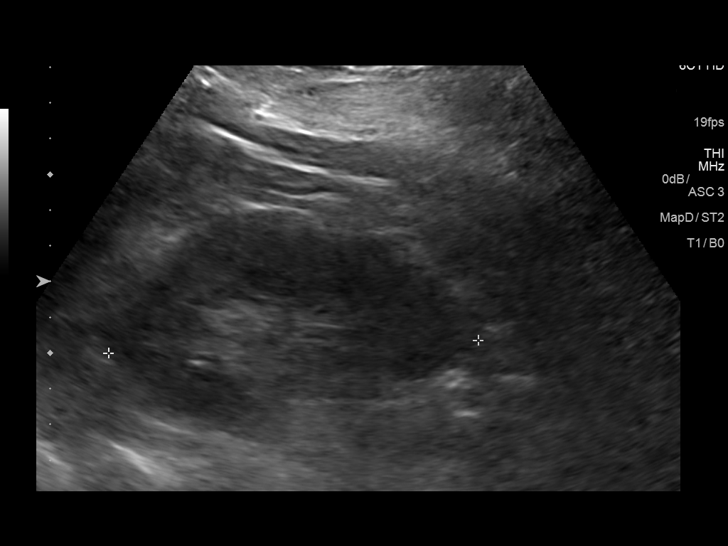
[im 19/36]
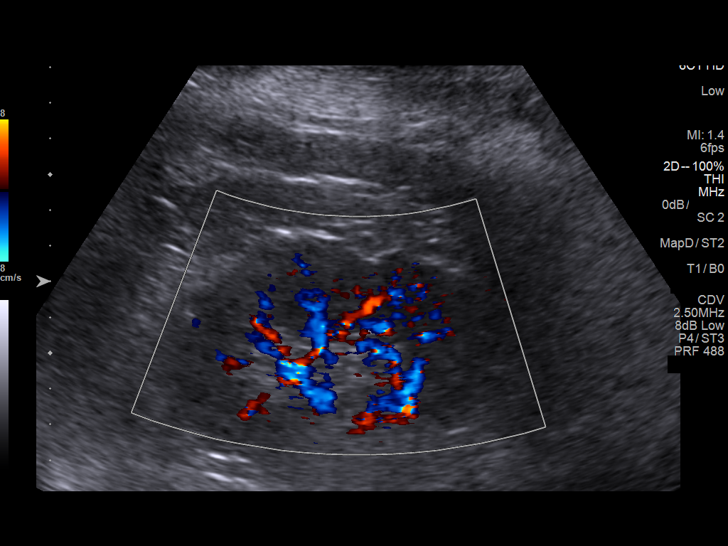
[im 22/36]
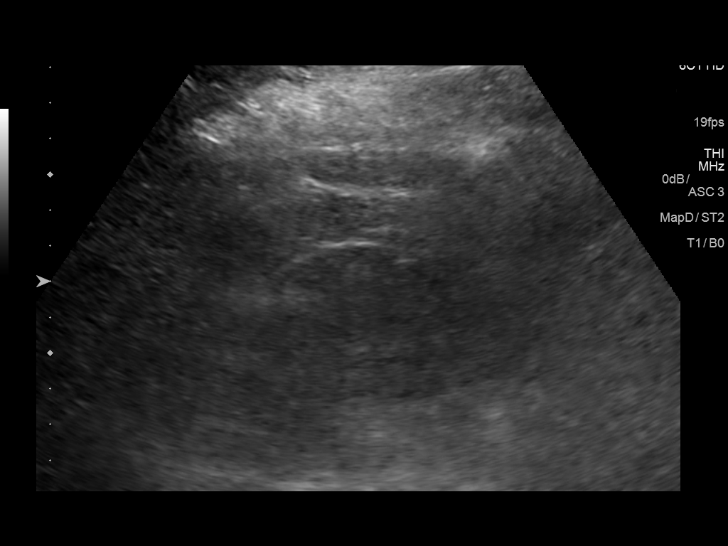
[im 24/36]
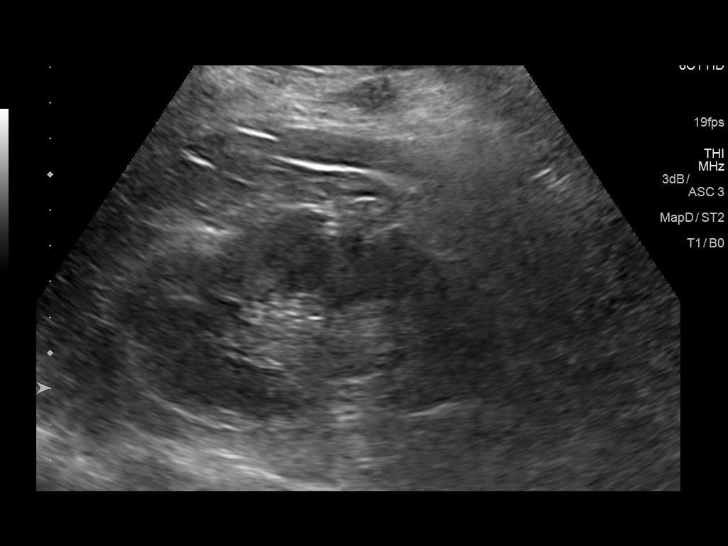
[im 27/36]
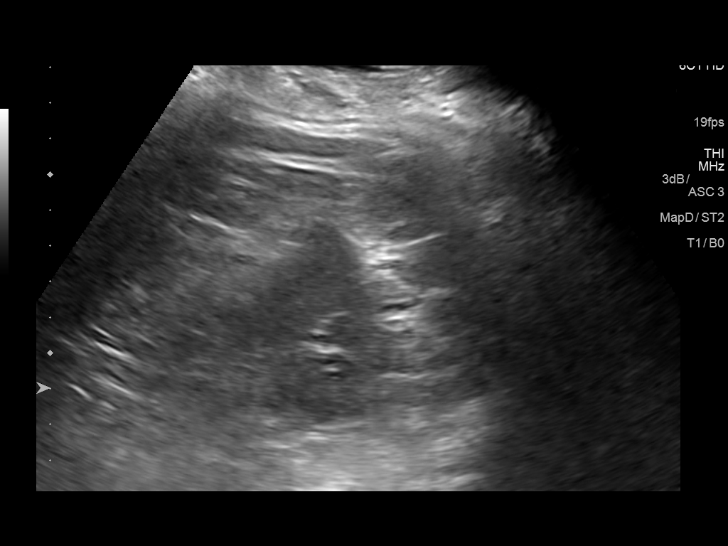
[im 30/36]
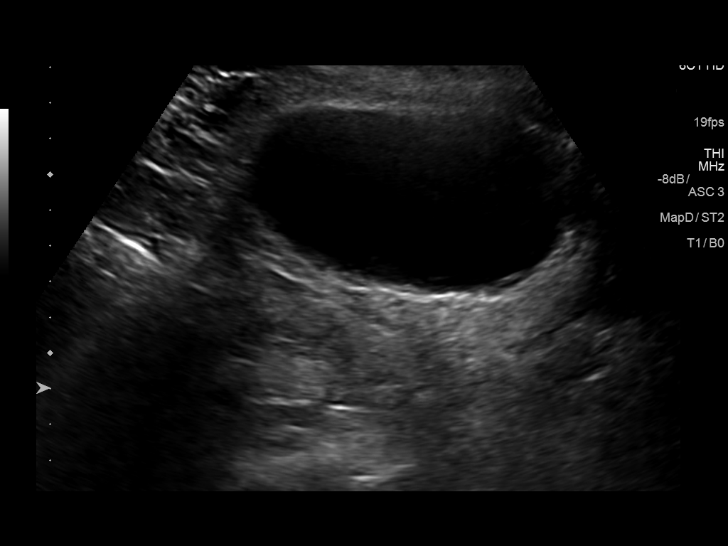
[im 33/36]
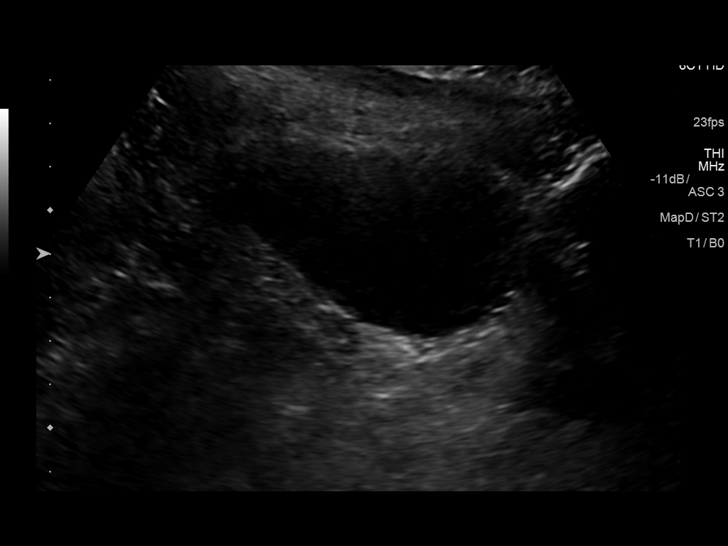
[im 36/36]
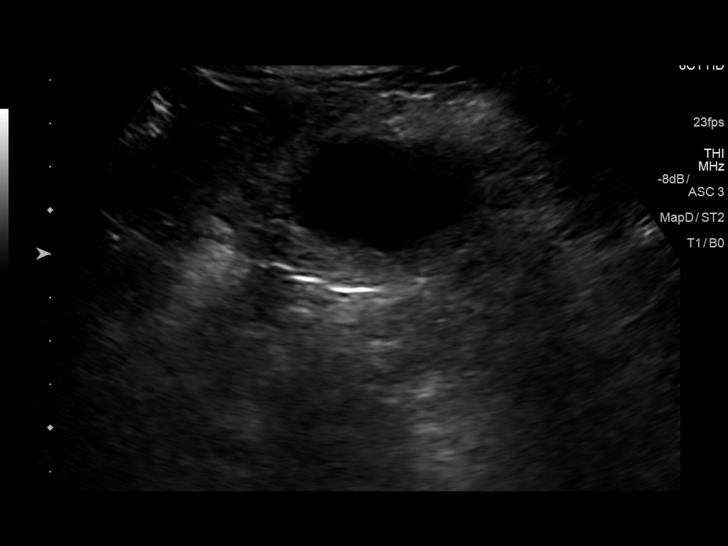

[14 of 25 positions shown; findings below may reference images not displayed]

FINDINGS: Right Kidney:

Length: 9.7 cm. Echogenicity within normal limits. No mass or
hydronephrosis visualized.

Left Kidney:

Length: 10.3 cm. Echogenicity within normal limits. No mass or
hydronephrosis visualized.

Bladder:

Appears normal for degree of bladder distention.
IMPRESSION: No acute abnormality noted.

## 2016-09-16 ENCOUNTER — Ambulatory Visit (INDEPENDENT_AMBULATORY_CARE_PROVIDER_SITE_OTHER): Payer: Medicaid Other | Admitting: Orthopaedic Surgery

## 2016-09-16 DIAGNOSIS — M1612 Unilateral primary osteoarthritis, left hip: Secondary | ICD-10-CM | POA: Insufficient documentation

## 2016-09-16 NOTE — Progress Notes (Signed)
Office Visit Note   Patient: Allen Espinoza           Date of Birth: 1960-07-25           MRN: [id number] Visit Date: 09/16/2016              Requested by: Asencion Partridge, MD [address] [place], [place] [address] PCP: Asencion Partridge, MD   Assessment & Plan: Visit Diagnoses:  1. Unilateral primary osteoarthritis, left hip     Plan: Given the severity of his left hip arthritis I do feel that a hip replacement is warranted. However this is accommodated situation. We would like to see his hidradenitis treated first especially due to there is of his right groin that are problematic. Also I would like to see her report that is HIV status is under better control. I do feel once these things have been taking care of he is a candidate for hip replacement surgery. I gave him a handout on anterior hip replacement surgery and we had a long and thorough discussion about what the surgery involves as well as a detailed discussion of the risk and benefits of surgery and is intraoperative and postoperative course. All questions were encouraged and answered and he understands fully are delay in 2 other medical issues are fully treated taking call significant problems for hip replacement surgery.  Follow-Up Instructions: Return in about 2 months (around 11/16/2016).   Orders:  No orders of the defined types were placed in this encounter.  No orders of the defined types were placed in this encounter.     Procedures: No procedures performed   Clinical Data: No additional findings.   Subjective: No chief complaint on file. The patient is someone I'm seeing for the first time but is been seen in this office before. This is for left hip pain and known osteoarthritis versus avascular necrosis of his left hip. He is a patient of Dr. [name] with the infectious disease clinic. He is HIV positive and is been switched recently on his medications according the patient. He had seen per his report an increase in  his CD4 count. He is also being treated for hidradenitis and actually has surgery in his left axilla by Dr. Coralie Keens in July. He has another area of hidradenitis in his right groin which apparently he is going to [facility] to be treated. He does report left hip pain which is 10 out of 10 and detrimentally affects his activities daily living, his quality of life, and his mobility. He has x-rays on our system for me to review. He walks with a limp and is significant problems with his left hip  HPI  Review of Systems He currently denies any fever, chills, nausea, vomiting, chest pain, shortness of breath or headache  Objective: Vital Signs: There were no vitals taken for this visit.  Physical Exam He is alert or 3 and in no acute distress blocks with significant limp Ortho Exam He is a moderately obese individual with significant pain on internal and external rotation of his left hip and deathly limitations to rotation of his left hip. Specialty Comments:  No specialty comments available.  Imaging: No results found. X-rays and apparently reviewed today show severe end-stage arthritis of his left hip. His right hip appears normal. The left hip has severe paratenon osteophytes and sclerotic changes as well as cystic changes. There is complete losses appear lateral joint space.  PMFS History: Patient Active Problem List  Diagnosis Date Noted  . Unilateral primary osteoarthritis, left hip 09/16/2016  . Edema 08/20/2016  . 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 ([place]) 08/19/2016  . Morbid (severe) obesity due to excess calories ([place]) 08/19/2016  . Pain in left hip 08/19/2016  . Solitary pulmonary nodule 05/06/2016  . Hidradenitis suppurativa 04/27/2016  . Chest pain 04/26/2016  . Avascular necrosis of bone of hip, left ([place]) 03/19/2016  . Human immunodeficiency virus (HIV) disease ([place]) 03/18/2016  . DVT (deep venous thrombosis) ([place]) 03/18/2016  .  Axillary abscess 03/18/2016  . Chronic kidney disease 03/18/2016  . Encounter for immunization 03/18/2016  . Cholelithiasis 03/18/2016  . Diverticulosis of colon without hemorrhage 03/18/2016   Past Medical History:  Diagnosis Date  . Abscess   . Avascular necrosis of femoral head, left ([place])   . Cholelithiasis   . CKD (chronic kidney disease) stage 3, GFR 30-59 ml/min   . DVT (deep venous thrombosis) ([place])   . HIV (human immunodeficiency virus infection) ([place])   . Morbid obesity ([place])     Family History  Problem Relation Age of Onset  . Heart attack Mother   . Heart attack Father     No past surgical history on file. Social History   Occupational History  . Not on file.   Social History Main Topics  . Smoking status: Never Smoker  . Smokeless tobacco: Never Used  . Alcohol use No     Comment: prior  . Drug use: No     Comment: prior cocaine use, last 2005  . Sexual activity: Yes    Partners: Female    Birth control/ protection: Condom

## 2016-10-07 NOTE — Pre-Procedure Instructions (Signed)
Allen Espinoza  10/07/2016      [facility] [address] [place], [place] [address] Phone: [phone] Fax: [phone]    Your procedure is scheduled on July 17  Report to [facility] at [place].M.  Call this number if you have problems the morning of surgery:  [phone]   Remember:  Do not eat food or drink liquids after midnight.   Take these medicines the morning of surgery with A SIP OF WATER GENVOYA, PREZISTA   7 days prior to surgery STOP taking any Aspirin, Aleve, Naproxen, Ibuprofen, Motrin, Advil, Goody's, BC's, all herbal medications, fish oil, and all vitamins  FOLLOW PHYSICIANS INSTRUCTIONS ABOUT XARELTO   Do not wear jewelry  Do not wear lotions, powders, or cologne, or deoderant.  Men may shave face and neck.  Do not bring valuables to the hospital.  [facility] is not responsible for any belongings or valuables.  Contacts, dentures or bridgework may not be worn into surgery.  Leave your suitcase in the car.  After surgery it may be brought to your room.  For patients admitted to the hospital, discharge time will be determined by your treatment team.  Patients discharged the day of surgery will not be allowed to drive home.    Special instructions:   [facility]- Preparing For Surgery  Before surgery, you can play an important role. Because skin is not sterile, your skin needs to be as free of germs as possible. You can reduce the number of germs on your skin by washing with CHG (chlorahexidine gluconate) Soap before surgery.  CHG is an antiseptic cleaner which kills germs and bonds with the skin to continue killing germs even after washing.  Please do not use if you have an allergy to CHG or antibacterial soaps. If your skin becomes reddened/irritated stop using the CHG.  Do not shave (including legs and underarms) for at least 48 hours prior to first CHG shower. It is OK to  shave your face.  Please follow these instructions carefully.   1. Shower the NIGHT BEFORE SURGERY and the MORNING OF SURGERY with CHG.   2. If you chose to wash your hair, wash your hair first as usual with your normal shampoo.  3. After you shampoo, rinse your hair and body thoroughly to remove the shampoo.  4. Use CHG as you would any other liquid soap. You can apply CHG directly to the skin and wash gently with a scrungie or a clean washcloth.   5. Apply the CHG Soap to your body ONLY FROM THE NECK DOWN.  Do not use on open wounds or open sores. Avoid contact with your eyes, ears, mouth and genitals (private parts). Wash genitals (private parts) with your normal soap.  6. Wash thoroughly, paying special attention to the area where your surgery will be performed.  7. Thoroughly rinse your body with warm water from the neck down.  8. DO NOT shower/wash with your normal soap after using and rinsing off the CHG Soap.  9. Pat yourself dry with a CLEAN TOWEL.   10. Wear CLEAN PAJAMAS   11. Place CLEAN SHEETS on your bed the night of your first shower and DO NOT SLEEP WITH PETS.    Day of Surgery: Do not apply any deodorants/lotions. Please wear clean clothes to the hospital/surgery center.      Please read over the following fact sheets  that you were given.

## 2016-10-08 ENCOUNTER — Encounter (HOSPITAL_COMMUNITY)
Admission: RE | Admit: 2016-10-08 | Discharge: 2016-10-08 | Disposition: A | Discharge status: Home / Self Care | Payer: Medicaid Other | Source: Ambulatory Visit | Attending: Surgery | Admitting: Surgery

## 2016-10-08 ENCOUNTER — Encounter (HOSPITAL_COMMUNITY): Payer: Self-pay

## 2016-10-08 ENCOUNTER — Ambulatory Visit (HOSPITAL_COMMUNITY)
Admission: RE | Admit: 2016-10-08 | Discharge: 2016-10-08 | Disposition: A | Discharge status: Home / Self Care | Payer: Medicaid Other | Source: Ambulatory Visit | Attending: Student in an Organized Health Care Education/Training Program | Admitting: Student in an Organized Health Care Education/Training Program

## 2016-10-08 ENCOUNTER — Other Ambulatory Visit: Payer: Self-pay

## 2016-10-08 DIAGNOSIS — N189 Chronic kidney disease, unspecified: Secondary | ICD-10-CM | POA: Diagnosis not present

## 2016-10-08 DIAGNOSIS — I071 Rheumatic tricuspid insufficiency: Secondary | ICD-10-CM | POA: Insufficient documentation

## 2016-10-08 DIAGNOSIS — Z86718 Personal history of other venous thrombosis and embolism: Secondary | ICD-10-CM | POA: Insufficient documentation

## 2016-10-08 DIAGNOSIS — B20 Human immunodeficiency virus [HIV] disease: Secondary | ICD-10-CM | POA: Insufficient documentation

## 2016-10-08 DIAGNOSIS — Z6841 Body Mass Index (BMI) 40.0 and over, adult: Secondary | ICD-10-CM | POA: Diagnosis not present

## 2016-10-08 DIAGNOSIS — R600 Localized edema: Secondary | ICD-10-CM | POA: Diagnosis not present

## 2016-10-08 HISTORY — DX: Unspecified osteoarthritis, unspecified site: M19.90

## 2016-10-08 HISTORY — DX: Cardiac arrhythmia, unspecified: I49.9

## 2016-10-08 HISTORY — DX: Gastro-esophageal reflux disease without esophagitis: K21.9

## 2016-10-08 HISTORY — DX: Dyspnea, unspecified: R06.00

## 2016-10-08 LAB — CBC
HCT: 35.6 % — ABNORMAL LOW (ref 39.0–52.0)
HEMOGLOBIN: 11.5 g/dL — AB (ref 13.0–17.0)
MCH: 31 pg (ref 26.0–34.0)
MCHC: 32.3 g/dL (ref 30.0–36.0)
MCV: 96 fL (ref 78.0–100.0)
Platelets: 307 10*3/uL (ref 150–400)
RBC: 3.71 MIL/uL — ABNORMAL LOW (ref 4.22–5.81)
RDW: 13.5 % (ref 11.5–15.5)
WBC: 8.5 10*3/uL (ref 4.0–10.5)

## 2016-10-08 LAB — BASIC METABOLIC PANEL
ANION GAP: 9 (ref 5–15)
BUN: 12 mg/dL (ref 6–20)
CALCIUM: 9 mg/dL (ref 8.9–10.3)
CO2: 21 mmol/L — ABNORMAL LOW (ref 22–32)
Chloride: 109 mmol/L (ref 101–111)
Creatinine, Ser: 1.51 mg/dL — ABNORMAL HIGH (ref 0.61–1.24)
GFR, EST AFRICAN AMERICAN: 57 mL/min — AB (ref >60)
GFR, EST NON AFRICAN AMERICAN: 50 mL/min — AB (ref >60)
GLUCOSE: 110 mg/dL — AB (ref 65–99)
Potassium: 3.9 mmol/L (ref 3.5–5.1)
SODIUM: 139 mmol/L (ref 135–145)

## 2016-10-08 NOTE — Progress Notes (Addendum)
PCP - Welford Roche Cardiologist - denies  Chest x-ray - not needed EKG - 10/08/16 Stress Test - 2007 in [facility] ECHO - 10/08/16 pending results Cardiac Cath - denies  Sending to anesthesia for review of records Spoke with Dr. Kalman Shan - wants to repeat EKG today Patient has c/o chest pain on a regular basis and PCP is aware who sent him for the ECHO. Describes pain as a dull pressure pain usually happens when he eats and laying down.  He also has shortness with exertion.    Last dose of Xaerelto 10/10/16  Patient denies shortness of breath, fever, cough and chest pain at PAT appointment   Patient verbalized understanding of instructions that were given to them at the PAT appointment. Patient was also instructed that they will need to review over the PAT instructions again at home before surgery.

## 2016-10-08 NOTE — Progress Notes (Signed)
   10/08/16 0901  OBSTRUCTIVE SLEEP APNEA  Have you ever been diagnosed with sleep apnea through a sleep study? No  Do you snore loudly (loud enough to be heard through closed doors)?  1  Do you often feel tired, fatigued, or sleepy during the daytime (such as falling asleep during driving or talking to someone)? 0  Has anyone observed you stop breathing during your sleep? 1  Do you have, or are you being treated for high blood pressure? 0  BMI more than 35 kg/m2? 1  Age > 50 (1-yes) 1  Neck circumference greater than:Male 16 inches or larger, Male 17inches or larger? 1  Male Gender (Yes=1) 1  Obstructive Sleep Apnea Score 6  Score 5 or greater  Results sent to PCP

## 2016-10-08 NOTE — Progress Notes (Signed)
  Echocardiogram 2D Echocardiogram has been performed.  Allen Espinoza 10/08/2016, 8:34 AM

## 2016-10-09 NOTE — Progress Notes (Signed)
Anesthesia chart review:  Patient is a 56 year old male scheduled for wide excision left axilla hidradenitis on 10/14/2016 with Coralie Keens, M.D.  - Receives primary care at the [facility]; that office referred pt for surgery - ID is Bobby Rumpf, MD  PMH includes: HIV, DVT (on chronic anticoagulation), CKD (stage III), GERD. Never smoker. BMI 41.5.  Medications include: Pepcid, genvoya, Prezista, xarelto. Last dose xarelto 10/10/16.   BP (!) 143/69   Pulse 60   Temp 36.8 C (Oral)   Resp 20   Ht 6' (1.829 m)   Wt (!) 306 lb 3.5 oz (138.9 kg)   SpO2 98%   BMI 41.53 kg/m    Preoperative labs reviewed.  CBC and BMET acceptable for surgery. PT will be obtained DOS.   Chest CT 05/17/16:  1. Again noted somewhat elongated triangular area of consolidation in left lower lobe posteriorly. Measures 1.6 by 1 cm inferiorly. This corresponds with nodular density seen on CT scan 01/29/2016. This most likely represents scarring or sequela from prior lung injury. No definite evidence of mass. This is best seen in coronal image 82 extending 3 cm cranial caudally. Please see also sagittal image 74. No follow-up is necessary unless clinically warranted. I would suggest a follow-up CT scan in 1 year to assure stability. No definite evidence of mass noted on this unenhanced scan. 2. No infiltrate or pulmonary edema. Small area of peripheral scarring noted in left upper lobe peripheral coronal image 45. 3. No mediastinal hematoma or adenopathy. 4. Multiple old left rib fractures deformities are noted. 5. Cholelithiasis. No evidence of acute cholecystitis or pericholecystic fluid. 6. Mild degenerative changes thoracic spine.  EKG 10/08/16: Sinus bradycardia (51 bpm)  Echo 10/08/16 (ordered by PCP for edema):  - Left ventricle: The cavity size was normal. There was moderate concentric hypertrophy. Systolic function was normal. The estimated ejection fraction was in the range  of 60% to 65%. Wall motion was normal; there were no regional wall motion abnormalities. Doppler parameters are consistent with abnormal left ventricular relaxation (grade 1 diastolic dysfunction). The E/e&' ratio is between 8-15, suggesting indeterminate LV filling pressure. - Left atrium: The atrium was normal in size. - Tricuspid valve: There was mild regurgitation. - Pulmonary arteries: PA peak pressure: 37 mm Hg (S). - Inferior vena cava: The vessel was normal in size. The respirophasic diameter changes were in the normal range (>= 50%), consistent with normal central venous pressure. - Impressions: LVEF 60-65%, moderate LVH, normal wall motion, grade 1 DD with indeterminate LV filling pressure, normal LA size, mild TR, RVSP 37 mmHg, normal IVC.  If PT acceptable DOS, I anticipate pt can proceed with surgery as scheduled.   Willeen Cass, FNP-BC [facility] Short Stay Surgical Center/Anesthesiology Phone: [phone] 10/09/2016 12:56 PM

## 2016-10-11 MED ORDER — DEXTROSE 5 % IV SOLN
3.0000 g | INTRAVENOUS | Status: AC
  Administered 2016-10-14: 3 g via INTRAVENOUS
  Filled 2016-10-11 (×2): qty 3000

## 2016-10-13 NOTE — H&P (Signed)
[place]  Location: [facility] Surgery Patient #: [id number] DOB: [birth year] Single / Language: Cleophus Molt / Race: Black or African American Male   History of Present Illness  The patient is a 56 year old male who presents with a complaint of Hidradenitis. This is a 56 year old gentleman who is referred by Dr. Bobby Rumpf for hidradenitis. He has a complex medical history including HIV. He has had hidradenitis involving his left axilla and inguinal areas and scrotum for several years. He is apparently being referred to dermatology specialist in [facility] regarding this as well. His situation is also complicated by a history of DVT and need for chronic anticoagulation. He reports multiple draining sinus tracts in the left axilla but has never needed and incision and drainage procedure   Past Surgical History  No pertinent past surgical history   Diagnostic Studies History  Colonoscopy  never  Allergies  Sulfacetamide *CHEMICALS*  Vancomycin HCl *ANTI-INFECTIVE AGENTS - MISC.*  Allergies Reconciled   Social History  Alcohol use  Remotely quit alcohol use. Caffeine use  Carbonated beverages. Illicit drug use  Remotely quit drug use. Tobacco use  Never smoker.  Family History  Alcohol Abuse  Brother, Mother. Heart Disease  Father, Mother. Malignant Neoplasm Of Pancreas  Brother. Prostate Cancer  Brother.  Other Problems  Arthritis  Back Pain  Chest pain  Cholelithiasis  Diverticulosis  Gastroesophageal Reflux Disease  HIV-positive  Pulmonary Embolism / Blood Clot in Legs     Review of Systems  General Present- Weight Gain. Not Present- Appetite Loss, Chills, Fatigue, Fever, Night Sweats and Weight Loss. Skin Present- Dryness, New Lesions, Non-Healing Wounds, Rash and Ulcer. Not Present- Change in Wart/Mole, Hives and Jaundice. HEENT Present- Visual Disturbances and Wears glasses/contact lenses. Not Present- Earache, Hearing Loss,  Hoarseness, Nose Bleed, Oral Ulcers, Ringing in the Ears, Seasonal Allergies, Sinus Pain, Sore Throat and Yellow Eyes. Breast Not Present- Breast Mass, Breast Pain, Nipple Discharge and Skin Changes. Cardiovascular Present- Chest Pain, Difficulty Breathing Lying Down, Palpitations, Rapid Heart Rate, Shortness of Breath and Swelling of Extremities. Not Present- Leg Cramps. Gastrointestinal Present- Change in Bowel Habits. Not Present- Abdominal Pain, Bloating, Bloody Stool, Chronic diarrhea, Constipation, Difficulty Swallowing, Excessive gas, Gets full quickly at meals, Hemorrhoids, Indigestion, Nausea, Rectal Pain and Vomiting. Male Genitourinary Present- Frequency. Not Present- Blood in Urine, Change in Urinary Stream, Impotence, Nocturia, Painful Urination, Urgency and Urine Leakage. Musculoskeletal Present- Back Pain, Joint Pain, Joint Stiffness, Muscle Weakness and Swelling of Extremities. Not Present- Muscle Pain. Neurological Present- Decreased Memory, Numbness, Tremor, Trouble walking and Weakness. Not Present- Fainting, Headaches, Seizures and Tingling. Psychiatric Present- Change in Sleep Pattern. Not Present- Anxiety, Bipolar, Depression, Fearful and Frequent crying. Endocrine Present- Excessive Hunger. Not Present- Cold Intolerance, Hair Changes, Heat Intolerance, Hot flashes and New Diabetes. Hematology Present- Blood Thinners, Easy Bruising, Excessive bleeding, Gland problems, HIV and Persistent Infections.  Vitals  Weight: 310.2 lb Height: 80in Body Surface Area: 2.77 m Body Mass Index: 34.08 kg/m  Temp.: 77F  Pulse: 68 (Regular)  P.OX: 97% (Room air) BP: 162/90 (Sitting, Left Arm, Standard)       Physical Exam The physical exam findings are as follows: Note:On examination, there are multiple draining chronic abscesses in the left axilla. He has chronic skin changes on his thighs and around his scrotum consistent with hidradenitis as well Lungs clear CV  RRR Abdomen soft, NT General awake and aler    Assessment & Plan  HIDRADENITIS SUPPURATIVA OF LEFT AXILLA (L73.2)  Impression: He  reports that he fully understands the diagnosis and that this is not a curable disease. Surgery from my standpoint is limited to areas that I can safely excise and close without the need for skin grafting. Anything more complex needs to be done at a tertiary care center. I offered wide excision of the left axilla. Any care of his inguinal area and scrotum were need to be done elsewhere. He will have to come off of the blood thinning medication for his surgery. I discussed the risks of surgery which includes but is not limited to bleeding, ongoing infection, having a chronic open wound with wound breakdown, etc. He understands and wishes to proceed with surgery    Signed by Harl Bowie, M

## 2016-10-14 ENCOUNTER — Encounter (HOSPITAL_COMMUNITY): Payer: Self-pay

## 2016-10-14 ENCOUNTER — Ambulatory Visit (HOSPITAL_COMMUNITY)
Admission: RE | Admit: 2016-10-14 | Discharge: 2016-10-14 | Disposition: A | Discharge status: Home / Self Care | Payer: Medicaid Other | Source: Ambulatory Visit | Attending: Surgery | Admitting: Surgery

## 2016-10-14 ENCOUNTER — Ambulatory Visit (HOSPITAL_COMMUNITY): Payer: Medicaid Other | Admitting: Emergency Medicine

## 2016-10-14 ENCOUNTER — Encounter (HOSPITAL_COMMUNITY)
Admission: RE | Disposition: A | Discharge status: Home / Self Care | Payer: Self-pay | Source: Ambulatory Visit | Attending: Surgery

## 2016-10-14 ENCOUNTER — Ambulatory Visit (HOSPITAL_COMMUNITY): Payer: Medicaid Other | Admitting: Anesthesiology

## 2016-10-14 DIAGNOSIS — Z882 Allergy status to sulfonamides status: Secondary | ICD-10-CM | POA: Diagnosis not present

## 2016-10-14 DIAGNOSIS — K219 Gastro-esophageal reflux disease without esophagitis: Secondary | ICD-10-CM | POA: Insufficient documentation

## 2016-10-14 DIAGNOSIS — Z21 Asymptomatic human immunodeficiency virus [HIV] infection status: Secondary | ICD-10-CM | POA: Diagnosis not present

## 2016-10-14 DIAGNOSIS — Z6841 Body Mass Index (BMI) 40.0 and over, adult: Secondary | ICD-10-CM | POA: Insufficient documentation

## 2016-10-14 DIAGNOSIS — Z79899 Other long term (current) drug therapy: Secondary | ICD-10-CM | POA: Diagnosis not present

## 2016-10-14 DIAGNOSIS — M1990 Unspecified osteoarthritis, unspecified site: Secondary | ICD-10-CM | POA: Diagnosis not present

## 2016-10-14 DIAGNOSIS — Z7901 Long term (current) use of anticoagulants: Secondary | ICD-10-CM | POA: Insufficient documentation

## 2016-10-14 DIAGNOSIS — L732 Hidradenitis suppurativa: Secondary | ICD-10-CM | POA: Diagnosis not present

## 2016-10-14 DIAGNOSIS — Z86718 Personal history of other venous thrombosis and embolism: Secondary | ICD-10-CM | POA: Insufficient documentation

## 2016-10-14 DIAGNOSIS — Z881 Allergy status to other antibiotic agents status: Secondary | ICD-10-CM | POA: Insufficient documentation

## 2016-10-14 DIAGNOSIS — Z8719 Personal history of other diseases of the digestive system: Secondary | ICD-10-CM | POA: Diagnosis not present

## 2016-10-14 HISTORY — PX: HYDRADENITIS EXCISION: SHX5243

## 2016-10-14 LAB — PROTIME-INR
INR: 1.16
PROTHROMBIN TIME: 14.9 s (ref 11.4–15.2)

## 2016-10-14 SURGERY — EXCISION, HIDRADENITIS, AXILLA
Anesthesia: General | Site: Axilla | Laterality: Left

## 2016-10-14 MED ORDER — PROPOFOL 10 MG/ML IV BOLUS
INTRAVENOUS | Status: AC
  Filled 2016-10-14: qty 20

## 2016-10-14 MED ORDER — OXYCODONE HCL 5 MG PO TABS: 5.0000 mg | ORAL_TABLET | Freq: Once | ORAL | Status: DC | PRN

## 2016-10-14 MED ORDER — FENTANYL CITRATE (PF) 100 MCG/2ML IJ SOLN
INTRAMUSCULAR | Status: DC | PRN
  Administered 2016-10-14 (×4): 25 ug via INTRAVENOUS

## 2016-10-14 MED ORDER — LIDOCAINE HCL (CARDIAC) 20 MG/ML IV SOLN
INTRAVENOUS | Status: AC
  Filled 2016-10-14: qty 5

## 2016-10-14 MED ORDER — OXYCODONE HCL 5 MG PO TABS: 5.0000 mg | ORAL_TABLET | Freq: Four times a day (QID) | ORAL | 0 refills | Status: DC | PRN

## 2016-10-14 MED ORDER — OXYCODONE HCL 5 MG/5ML PO SOLN: 5.0000 mg | Freq: Once | ORAL | Status: DC | PRN

## 2016-10-14 MED ORDER — OXYCODONE HCL 5 MG PO TABS: 5.0000 mg | ORAL_TABLET | ORAL | Status: DC | PRN

## 2016-10-14 MED ORDER — LIDOCAINE HCL (CARDIAC) 20 MG/ML IV SOLN
INTRAVENOUS | Status: DC | PRN
  Administered 2016-10-14: 60 mg via INTRAVENOUS

## 2016-10-14 MED ORDER — MIDAZOLAM HCL 2 MG/2ML IJ SOLN
INTRAMUSCULAR | Status: AC
  Filled 2016-10-14: qty 2

## 2016-10-14 MED ORDER — BUPIVACAINE-EPINEPHRINE 0.25% -1:200000 IJ SOLN
INTRAMUSCULAR | Status: AC
  Filled 2016-10-14: qty 1

## 2016-10-14 MED ORDER — LACTATED RINGERS IV SOLN
INTRAVENOUS | Status: DC | PRN
  Administered 2016-10-14: 07:00:00 via INTRAVENOUS

## 2016-10-14 MED ORDER — CHLORHEXIDINE GLUCONATE CLOTH 2 % EX PADS: 6.0000 | MEDICATED_PAD | Freq: Once | CUTANEOUS | Status: DC

## 2016-10-14 MED ORDER — BUPIVACAINE-EPINEPHRINE 0.25% -1:200000 IJ SOLN
INTRAMUSCULAR | Status: DC | PRN
  Administered 2016-10-14: 20 mL

## 2016-10-14 MED ORDER — HYDROMORPHONE HCL 1 MG/ML IJ SOLN
0.2500 mg | INTRAMUSCULAR | Status: DC | PRN
Start: 2016-10-14 — End: 2016-10-14

## 2016-10-14 MED ORDER — ONDANSETRON HCL 4 MG/2ML IJ SOLN
INTRAMUSCULAR | Status: DC | PRN
  Administered 2016-10-14: 4 mg via INTRAVENOUS

## 2016-10-14 MED ORDER — MIDAZOLAM HCL 5 MG/5ML IJ SOLN
INTRAMUSCULAR | Status: DC | PRN
  Administered 2016-10-14: 2 mg via INTRAVENOUS

## 2016-10-14 MED ORDER — PROMETHAZINE HCL 25 MG/ML IJ SOLN: 6.2500 mg | INTRAMUSCULAR | Status: DC | PRN

## 2016-10-14 MED ORDER — MORPHINE SULFATE (PF) 2 MG/ML IV SOLN: 1.0000 mg | INTRAVENOUS | Status: DC | PRN

## 2016-10-14 MED ORDER — DEXAMETHASONE SODIUM PHOSPHATE 10 MG/ML IJ SOLN
INTRAMUSCULAR | Status: DC | PRN
  Administered 2016-10-14: 5 mg via INTRAVENOUS

## 2016-10-14 MED ORDER — FENTANYL CITRATE (PF) 250 MCG/5ML IJ SOLN
INTRAMUSCULAR | Status: AC
  Filled 2016-10-14: qty 5

## 2016-10-14 MED ORDER — 0.9 % SODIUM CHLORIDE (POUR BTL) OPTIME
TOPICAL | Status: DC | PRN
  Administered 2016-10-14: 1000 mL

## 2016-10-14 MED ORDER — DEXAMETHASONE SODIUM PHOSPHATE 10 MG/ML IJ SOLN
INTRAMUSCULAR | Status: AC
  Filled 2016-10-14: qty 1

## 2016-10-14 MED ORDER — ROCURONIUM BROMIDE 50 MG/5ML IV SOLN
INTRAVENOUS | Status: AC
  Filled 2016-10-14: qty 1

## 2016-10-14 MED ORDER — CLINDAMYCIN HCL 300 MG PO CAPS: 300.0000 mg | ORAL_CAPSULE | Freq: Three times a day (TID) | ORAL | 0 refills | Status: AC

## 2016-10-14 MED ORDER — PROPOFOL 10 MG/ML IV BOLUS
INTRAVENOUS | Status: DC | PRN
  Administered 2016-10-14: 200 mg via INTRAVENOUS

## 2016-10-14 MED ORDER — ONDANSETRON HCL 4 MG/2ML IJ SOLN
INTRAMUSCULAR | Status: AC
  Filled 2016-10-14: qty 2

## 2016-10-14 SURGICAL SUPPLY — 35 items
CANISTER SUCT 3000ML PPV (MISCELLANEOUS) ×3 IMPLANT
CHLORAPREP W/TINT 26ML (MISCELLANEOUS) ×3 IMPLANT
COVER SURGICAL LIGHT HANDLE (MISCELLANEOUS) ×3 IMPLANT
DECANTER SPIKE VIAL GLASS SM (MISCELLANEOUS) IMPLANT
DERMABOND ADVANCED (GAUZE/BANDAGES/DRESSINGS) ×2
DERMABOND ADVANCED .7 DNX12 (GAUZE/BANDAGES/DRESSINGS) ×1 IMPLANT
DRAPE LAPAROSCOPIC ABDOMINAL (DRAPES) ×3 IMPLANT
DRAPE LAPAROTOMY 100X72 PEDS (DRAPES) IMPLANT
ELECT CAUTERY BLADE 6.4 (BLADE) ×3 IMPLANT
ELECT REM PT RETURN 9FT ADLT (ELECTROSURGICAL) ×3
ELECTRODE REM PT RTRN 9FT ADLT (ELECTROSURGICAL) ×1 IMPLANT
GAUZE SPONGE 4X4 12PLY STRL (GAUZE/BANDAGES/DRESSINGS) ×3 IMPLANT
GLOVE SURG SIGNA 7.5 PF LTX (GLOVE) ×3 IMPLANT
GLOVE SURG SS PI 8.5 STRL IVOR (GLOVE) ×2
GLOVE SURG SS PI 8.5 STRL STRW (GLOVE) ×1 IMPLANT
GOWN STRL REUS W/ TWL LRG LVL3 (GOWN DISPOSABLE) ×1 IMPLANT
GOWN STRL REUS W/ TWL XL LVL3 (GOWN DISPOSABLE) ×1 IMPLANT
GOWN STRL REUS W/TWL LRG LVL3 (GOWN DISPOSABLE) ×2
GOWN STRL REUS W/TWL XL LVL3 (GOWN DISPOSABLE) ×2
KIT BASIN OR (CUSTOM PROCEDURE TRAY) ×3 IMPLANT
KIT ROOM TURNOVER OR (KITS) ×3 IMPLANT
NEEDLE HYPO 25GX1X1/2 BEV (NEEDLE) ×3 IMPLANT
NS IRRIG 1000ML POUR BTL (IV SOLUTION) ×3 IMPLANT
PACK GENERAL/GYN (CUSTOM PROCEDURE TRAY) ×3 IMPLANT
PAD ABD 8X10 STRL (GAUZE/BANDAGES/DRESSINGS) ×6 IMPLANT
PAD ARMBOARD 7.5X6 YLW CONV (MISCELLANEOUS) ×3 IMPLANT
SPECIMEN JAR MEDIUM (MISCELLANEOUS) IMPLANT
SUT ETHILON 2 0 FS 18 (SUTURE) ×9 IMPLANT
SUT ETHILON 3 0 FSL (SUTURE) IMPLANT
SUT MNCRL AB 4-0 PS2 18 (SUTURE) ×3 IMPLANT
SUT VIC AB 3-0 SH 18 (SUTURE) ×3 IMPLANT
SYR CONTROL 10ML LL (SYRINGE) ×3 IMPLANT
TAPE CLOTH SURG 4X10 WHT LF (GAUZE/BANDAGES/DRESSINGS) ×3 IMPLANT
TOWEL OR 17X24 6PK STRL BLUE (TOWEL DISPOSABLE) ×3 IMPLANT
TOWEL OR 17X26 10 PK STRL BLUE (TOWEL DISPOSABLE) IMPLANT

## 2016-10-14 NOTE — Op Note (Deleted)
  The note originally documented on this encounter has been moved the the encounter in which it belongs.  

## 2016-10-14 NOTE — Anesthesia Procedure Notes (Addendum)
Procedure Name: LMA Insertion Date/Time: 10/14/2016 7:39 AM Performed by: Salli Quarry Chaselyn Nanney Pre-anesthesia Checklist: Patient identified, Emergency Drugs available, Suction available and Patient being monitored Patient Re-evaluated:Patient Re-evaluated prior to induction Oxygen Delivery Method: Circle System Utilized Preoxygenation: Pre-oxygenation with 100% oxygen Induction Type: IV induction Ventilation: Mask ventilation without difficulty LMA: LMA inserted LMA Size: 4.0 Number of attempts: 1 Airway Equipment and Method: Bite block Placement Confirmation: positive ETCO2 Tube secured with: Tape Dental Injury: Teeth and Oropharynx as per pre-operative assessment  Comments: Intubation performed by Elmer Sow, SRNA

## 2016-10-14 NOTE — Op Note (Signed)
WIDE EXCISION HIDRADENITIS LEFT AXILLA  Procedure Note  DEMARCO BACCI 10/14/2016   Pre-op Diagnosis: hidradenitis left axilla     Post-op Diagnosis: same  Procedure(s): WIDE EXCISION HIDRADENITIS LEFT AXILLA (30 square cm skin and subcutaneous tissue)  Surgeon(s): Coralie Keens, MD  Anesthesia: General  Staff:  Circulator: Islam, Rushdan M, RN Scrub Person: Christeen Amaranta Mehl, [place] Circulator Assistant: Christen Bame, RN  Estimated Blood Loss: Minimal               Procedure: The patient was brought to the operating room and identified as the correct patient. He was placed supine on the operating table and general anesthesia was induced. His left axilla was then prepped and draped in the usual sterile fashion. He had multiple chronic draining sinus tracts in the axilla. I anesthetized the skin around these of Marcaine. I then performed a wide elliptical incision with a scalpel incorporating all the draining areas. I then should be incision down into the subjacent tissue with electrocautery. I completely excised the skin and underlying subjacent tissue and chronic granulation tissue with the electrocautery. 30 cm of skin and subcutaneous tissue was excised. I then achieved hemostasis with the cautery. I anesthetized wound further with Marcaine and irrigated with saline. I then loosely approximated the subcutaneous tissue in several areas with 3-0 Vicryl sutures.  I then closed the incision with several running 2-0 nylon sutures. I did make a separate skin incision to drain a small developing abscess just inferior to this incision which could not be incorporated in the excision area. The patient tolerated procedure well. Gauze and tape were placed over the incision. All counts were correct at the end of the procedure. The patient was then extubated in the operating room and taken in a stable condition to the recovery room.          Andreina Outten A   Date: 10/14/2016  Time: 8:12  AM

## 2016-10-14 NOTE — Anesthesia Preprocedure Evaluation (Addendum)
Anesthesia Evaluation  Patient identified by MRN, date of birth, ID band Patient awake    Reviewed: Allergy & Precautions, NPO status , Patient's Chart, lab work & pertinent test results  Airway Mallampati: II  TM Distance: >3 FB Neck ROM: Full    Dental  (+) Edentulous Upper, Edentulous Lower   Pulmonary neg pulmonary ROS,    Pulmonary exam normal breath sounds clear to auscultation       Cardiovascular negative cardio ROS Normal cardiovascular exam Rhythm:Regular Rate:Normal  ECG: SB, rate 51  ECHO: - LVEF 60-65%, moderate LVH, normal wall motion, grade 1 DD with indeterminate LV filling pressure, normal LA size, mild TR, RVSP 37 mmHg, normal IVC.   Neuro/Psych negative neurological ROS  negative psych ROS   GI/Hepatic Neg liver ROS, neg GERD  Controlled,  Endo/Other  Morbid obesity  Renal/GU Renal InsufficiencyRenal disease  negative genitourinary   Musculoskeletal  (+) Arthritis ,   Abdominal (+) + obese,   Peds negative pediatric ROS (+)  Hematology  (+) anemia , HIV,   Anesthesia Other Findings DVT  Reproductive/Obstetrics negative OB ROS                            Anesthesia Physical Anesthesia Plan  ASA: III  Anesthesia Plan: General   Post-op Pain Management:    Induction: Intravenous  PONV Risk Score and Plan: 3 and Ondansetron, Dexamethasone, Propofol and Midazolam  Airway Management Planned: LMA  Additional Equipment:   Intra-op Plan:   Post-operative Plan: Extubation in OR  Informed Consent: I have reviewed the patients History and Physical, chart, labs and discussed the procedure including the risks, benefits and alternatives for the proposed anesthesia with the patient or authorized representative who has indicated his/her understanding and acceptance.   Dental advisory given  Plan Discussed with: CRNA  Anesthesia Plan Comments:        Anesthesia  Quick Evaluation

## 2016-10-14 NOTE — Transfer of Care (Signed)
Immediate Anesthesia Transfer of Care Note  Patient: Allen Espinoza  Procedure(s) Performed: Procedure(s): WIDE EXCISION HIDRADENITIS LEFT AXILLA (Left)  Patient Location: PACU  Anesthesia Type:General  Level of Consciousness: awake, alert  and patient cooperative  Airway & Oxygen Therapy: Patient Spontanous Breathing and Patient connected to nasal cannula oxygen  Post-op Assessment: Report given to RN and Post -op Vital signs reviewed and stable  Post vital signs: Reviewed and stable  Last Vitals:  Vitals:   10/14/16 0544  BP: 115/71  Pulse: (!) 51  Resp: 20  Temp: 36.7 C    Last Pain:  Vitals:   10/14/16 0554  TempSrc:   PainSc: 5       Patients Stated Pain Goal: 2 (96/88/64 [id number])  Complications: No apparent anesthesia complications

## 2016-10-14 NOTE — Interval H&P Note (Signed)
History and Physical Interval Note: no change in H and P  10/14/2016 7:05 AM  Allen Espinoza  has presented today for surgery, with the diagnosis of hidradenitis left axilla  The various methods of treatment have been discussed with the patient and family. After consideration of risks, benefits and other options for treatment, the patient has consented to  Procedure(s): WIDE EXCISION HIDRADENITIS LEFT AXILLA (Left) as a surgical intervention .  The patient's history has been reviewed, patient examined, no change in status, stable for surgery.  I have reviewed the patient's chart and labs.  Questions were answered to the patient's satisfaction.     Shondale Quinley A

## 2016-10-14 NOTE — Anesthesia Postprocedure Evaluation (Signed)
Anesthesia Post Note  Patient: Allen Espinoza  Procedure(s) Performed: Procedure(s) (LRB): WIDE EXCISION HIDRADENITIS LEFT AXILLA (Left)     Patient location during evaluation: PACU Anesthesia Type: General Level of consciousness: awake and alert Pain management: pain level controlled Vital Signs Assessment: post-procedure vital signs reviewed and stable Respiratory status: spontaneous breathing, nonlabored ventilation, respiratory function stable and patient connected to nasal cannula oxygen Cardiovascular status: blood pressure returned to baseline and stable Postop Assessment: no signs of nausea or vomiting Anesthetic complications: no    Last Vitals:  Vitals:   10/14/16 0900 10/14/16 0909  BP:  133/76  Pulse: (!) 52 (!) 54  Resp: 16   Temp: 36.4 C     Last Pain:  Vitals:   10/14/16 0909  TempSrc:   PainSc: 0-No pain                 Ryan P Ellender

## 2016-10-14 NOTE — Discharge Instructions (Signed)
Ok to shower tomorrow  Expect a lot of drainage from the incision  Cover with dry bandages daily and as needed  Ice pack and tylenol also for pain  Call the office and get an appointment in 2 weeks for suture removal  Resume blood thinning medication tomorrow

## 2016-10-15 ENCOUNTER — Encounter (HOSPITAL_COMMUNITY): Payer: Self-pay | Admitting: Surgery

## 2016-11-01 ENCOUNTER — Other Ambulatory Visit: Payer: Self-pay | Admitting: *Deleted

## 2016-11-01 MED ORDER — DARUNAVIR ETHANOLATE 800 MG PO TABS: ORAL_TABLET | ORAL | 3 refills | Status: DC

## 2016-11-01 MED ORDER — ELVITEG-COBIC-EMTRICIT-TENOFAF 150-150-200-10 MG PO TABS: ORAL_TABLET | ORAL | 3 refills | Status: DC

## 2016-11-08 ENCOUNTER — Other Ambulatory Visit: Payer: Self-pay | Admitting: *Deleted

## 2016-11-08 MED ORDER — RIVAROXABAN 20 MG PO TABS: 20.0000 mg | ORAL_TABLET | Freq: Every morning | ORAL | 0 refills | Status: DC

## 2016-11-18 ENCOUNTER — Ambulatory Visit (INDEPENDENT_AMBULATORY_CARE_PROVIDER_SITE_OTHER): Payer: Medicaid Other | Admitting: Orthopaedic Surgery

## 2016-11-18 ENCOUNTER — Ambulatory Visit: Payer: Medicaid Other | Admitting: Infectious Diseases

## 2016-11-18 DIAGNOSIS — M87052 Idiopathic aseptic necrosis of left femur: Secondary | ICD-10-CM | POA: Diagnosis not present

## 2016-11-18 DIAGNOSIS — M1612 Unilateral primary osteoarthritis, left hip: Secondary | ICD-10-CM | POA: Diagnosis not present

## 2016-11-18 NOTE — Progress Notes (Signed)
The patient is well-known to me. 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 He is actually been operated on recently by my brother a Education officer, environmental for hidradenitis. That is been doing well. He says with his HIV status that he is under good control. He actually sees Dr. [name] his infectious disease specialist tomorrow. We had a long and thorough discussion at his last visit of considering hip replacement surgery due to the severe disease in his left hip. We went through his x-rays and had a long discussion of the risk and benefits of surgery including a detailed discussion of what is intraoperative and postoperative course will involve. All questions were encouraged and answered. He has or questions today whereas all these.  On exam I had him lie flat shoulder more we'll make this incision. I explained out his risks of infection are heightened more due to his obesity and his immune status. He understands this as well. He does wish to have this surgery set up soon and I feel that we can accommodate him. I talked about what is therapy course with involved as well. All questions again were encouraged and answered. We'll see him back in 2 weeks postoperative once we have the surgery set up.

## 2016-11-19 ENCOUNTER — Ambulatory Visit (INDEPENDENT_AMBULATORY_CARE_PROVIDER_SITE_OTHER): Payer: Medicaid Other | Admitting: Infectious Diseases

## 2016-11-19 VITALS — BP 133/77 | HR 52 | Temp 98.0°F | Wt 298.0 lb

## 2016-11-19 DIAGNOSIS — L732 Hidradenitis suppurativa: Secondary | ICD-10-CM | POA: Diagnosis present

## 2016-11-19 DIAGNOSIS — N183 Chronic kidney disease, stage 3 unspecified: Secondary | ICD-10-CM

## 2016-11-19 DIAGNOSIS — M87052 Idiopathic aseptic necrosis of left femur: Secondary | ICD-10-CM | POA: Diagnosis not present

## 2016-11-19 DIAGNOSIS — B20 Human immunodeficiency virus [HIV] disease: Secondary | ICD-10-CM

## 2016-11-19 DIAGNOSIS — M25552 Pain in left hip: Secondary | ICD-10-CM

## 2016-11-19 LAB — CBC WITH DIFFERENTIAL/PLATELET
Basophils Absolute: 0 cells/uL (ref 0–200)
Basophils Relative: 0 %
EOS PCT: 2 %
Eosinophils Absolute: 144 cells/uL (ref 15–500)
HEMATOCRIT: 32.3 % — AB (ref 38.5–50.0)
Hemoglobin: 10.3 g/dL — ABNORMAL LOW (ref 13.2–17.1)
LYMPHS ABS: 1656 {cells}/uL (ref 850–3900)
Lymphocytes Relative: 23 %
MCH: 29.5 pg (ref 27.0–33.0)
MCHC: 31.9 g/dL — AB (ref 32.0–36.0)
MCV: 92.6 fL (ref 80.0–100.0)
MONO ABS: 504 {cells}/uL (ref 200–950)
MPV: 9.4 fL (ref 7.5–12.5)
Monocytes Relative: 7 %
NEUTROS ABS: 4896 {cells}/uL (ref 1500–7800)
Neutrophils Relative %: 68 %
Platelets: 362 10*3/uL (ref 140–400)
RBC: 3.49 MIL/uL — AB (ref 4.20–5.80)
RDW: 15.3 % — AB (ref 11.0–15.0)
WBC: 7.2 10*3/uL (ref 3.8–10.8)

## 2016-11-19 NOTE — Assessment & Plan Note (Signed)
Continue Genvoya + Prezista. Will check CD4 and VL today. He is not currently on OI proph - has been on and off antibiotics a lot over the last few months. Will check G6PD today in anticipation of starting Dapsone. No concerning signs for OI today. Follow up with Dr. [name] in 3 months.

## 2016-11-19 NOTE — Patient Instructions (Signed)
Continue your Genvoya and Mertens.   Adding Humira would not interact with these medications. Will draw requested labs today and discuss further with Dr. Dellis Filbert.   Please come back to see Dr. [name] in 3 months  Good luck with your hip surgery.

## 2016-11-19 NOTE — Progress Notes (Signed)
PCP - Welford Roche, MD  ID Provider - Dr. [name]  Dermatology - Felix Ahmadi, [place]     Patient Active Problem List   Diagnosis Date Noted  . Unilateral primary osteoarthritis, left hip 09/16/2016  . Edema 08/20/2016  . 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 ([place]) 08/19/2016  . Morbid (severe) obesity due to excess calories ([place]) 08/19/2016  . Pain in left hip 08/19/2016  . Solitary pulmonary nodule 05/06/2016  . Hidradenitis suppurativa 04/27/2016  . Chest pain 04/26/2016  . Avascular necrosis of bone of hip, left ([place]) 03/19/2016  . Human immunodeficiency virus (HIV) disease ([place]) 03/18/2016  . DVT (deep venous thrombosis) ([place]) 03/18/2016  . Axillary abscess 03/18/2016  . Chronic kidney disease 03/18/2016  . Encounter for immunization 03/18/2016  . Cholelithiasis 03/18/2016  . Diverticulosis of colon without hemorrhage 03/18/2016    Patient's Medications  New Prescriptions   No medications on file  Previous Medications   DARUNAVIR (PREZISTA) 800 MG TABLET    TAKE 1 TABLET BY MOUTH ONCE DAILY WITH BREAKFAST   ELVITEGRAVIR-COBICISTAT-EMTRICITABINE-TENOFOVIR (GENVOYA) 150-150-200-10 MG TABS TABLET    TAKE ONE TABLET BY MOUTH ONCE DAILY WITH BREAKFAST   MINOCYCLINE (DYNACIN) 50 MG TABLET    Take 50 mg by mouth 2 (two) times daily.   OXYCODONE (OXY IR/ROXICODONE) 5 MG IMMEDIATE RELEASE TABLET    Take 1-2 tablets (5-10 mg total) by mouth every 6 (six) hours as needed for moderate pain, severe pain or breakthrough pain.   RIVAROXABAN (XARELTO) 20 MG TABS TABLET    Take 1 tablet (20 mg total) by mouth every morning.  Modified Medications   No medications on file  Discontinued Medications   No medications on file    Subjective: Allen Espinoza presents to clinic today for routine follow up care for his HIV infection. He is a patient of Dr. Algis Downs that has been seen here since 05/2016. Was diagnosed with HIV in 2007. No history of OI.     HPI:  HIV =  Currently taking Genvoya + Prezista. Takes these medications every day with food and does not miss ever. Endorses no complaints today suggestive of associated opportunistic infection or advancing HIV disease such as fevers, night sweats, weight loss, anorexia, cough, SOB, nausea, vomiting, diarrhea, headache, sensory changes, lymphadenopathy or oral thrush. Does continue to have complications a/w hidradenitis in axilla and groin.   Hidradenitis Suppurativa = surgery by Dr. Ninfa Linden ([place]) October 14, 2016 for the axillary lesions that could be resected, however some were too deep. Currently being followed by Almyra Free, PA with the [facility] and was again placed on Minocycline 2 weeks ago - still with drainage and open area to left axilla but reports improvement at site. Previously was on prolonged therapy with this for 3 months.  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   DVT = continues on xeralto. No swelling, warmth or pain in either LE's.    Review of Systems: Review of Systems  Constitutional: Negative for chills and fever.  HENT: Negative for sore throat.   Respiratory: Negative for cough and sputum production.   Cardiovascular: Negative for chest pain and leg swelling.  Gastrointestinal: Negative for abdominal pain, diarrhea and vomiting.  Genitourinary: Negative for dysuria.  Musculoskeletal: Positive for joint pain (left hip).  Skin: Positive for rash (left axilla draining site previously incised from surgery).  Neurological: Negative for dizziness and headaches.  Psychiatric/Behavioral: Negative for depression. The  patient is not nervous/anxious.     Past Medical History:  Diagnosis Date  . Abscess   . Arthritis    all over  . Avascular necrosis of femoral head, left ([place])   . Cholelithiasis   . CKD (chronic kidney disease) stage 3, GFR 30-59 ml/min   . DVT (deep venous thrombosis) ([facility])    legs  . Dyspnea    when walking  . Dysrhythmia     remembers mother taking about having an irregular rhythm years when he was a child   . GERD (gastroesophageal reflux disease)    takes Pepcid  . HIV (human immunodeficiency virus infection) ([place])   . Morbid obesity ([place])    History  Sexual Activity  . Sexual activity: Yes  . Partners: Female  . Birth control/ protection: Condom   Social History  Substance Use Topics  . Smoking status: Never Smoker  . Smokeless tobacco: Never Used  . Alcohol use No     Comment: prior   Allergies  Allergen Reactions  . Sulfa Antibiotics Other (See Comments)    Low blood pressure  . Vancomycin Itching    Give with benadryl    Objective:  Vitals:   11/19/16 0935  BP: 133/77  Pulse: (!) 52  Temp: 98 F (36.7 C)  TempSrc: Oral  Weight: 298 lb (135.2 kg)   Body mass index is 40.42 kg/m.  Physical Exam  Constitutional: He is oriented to person, place, and time and well-developed, well-nourished, and in no distress.  HENT:  Mouth/Throat: Oropharynx is clear and moist.  Eyes: No scleral icterus.  Neck: Neck supple.  Cardiovascular: Normal rate, regular rhythm and normal heart sounds.   Pulmonary/Chest: Breath sounds normal. No respiratory distress.  Abdominal: Soft. Bowel sounds are normal. He exhibits no distension. There is no tenderness.  Lymphadenopathy:    He has no cervical adenopathy.  Neurological: He is alert and oriented to person, place, and time.  Skin: Skin is warm and dry.  Small draining area to left axilla from what appears to be previously incised area from recent surgery. Some thin tan drainage noted on dressing.   Psychiatric: Affect normal.    Lab Results HIV 1 RNA Quant (copies/mL)  Date Value  08/07/2016 <20 NOT DETECTED  04/26/2016 30   HIV-1 RNA Viral Load (copies/mL)  Date Value  03/18/2016 47,300   CD4 T Cell Abs (/uL)  Date Value  08/07/2016 160 (L)  08/01/2016 130 (L)  04/26/2016 200 (L)   Lab Results  Component Value Date   QUANTGOLD  NEGATIVE 08/07/2016     Problem List Items Addressed This Visit      Musculoskeletal and Integument   Avascular necrosis of bone of hip, left ([place])    Planned for total hip replacement with Dr. Ninfa Linden next week. He is looking forward to this surgery. Recently demonstrated virologic suppression and has reported excellent adherence of current regimen. Will reassess again today with upcoming surgery.       Hidradenitis suppurativa - Primary    Apparently this is severe case that has been refractory to oral antibiotics and topical clinidamycin gel. He tells me that prior to his lapse in his ART prior to coming to clinic this had been under better control.   Seen by general surgery - unable to be performed to help with management at all sites of involvement, particularly the groin and scrotal areas. CD4 continues to be slow to reconstitute in setting of virologic suppression and active/ongoing infections.  Would continue fluoroquinolone considering previous culture with pseudomonas.   His dermatology team would like to start him on Humira. I discussed with Mr. Heart today that should this regimen be added it will place him at even higher risk for invasive fungal and bacterial infections, tuberculosis and lymphoma being he is also HIV+ with suboptimal immune response. In reviewing case studies on use of these agents with HIV + patients it has almost always been in settings of higher CD4 counts. Although there is no interactions with current ART regimen, unfortunately I do not think this is a safe option to start this for him with potentially life-threatening infection risk. He has failed protracted oral regimens of minocycline, clindamycin gel and surgery team is unable to treat his groin/scrotal region. He is awaiting referral to specialist in [facility] for HS medical management.         Genitourinary   Chronic kidney disease     Other   Human immunodeficiency virus (HIV) disease ([place])    Continue  Genvoya + Prezista. Will check CD4 and VL today. He is not currently on OI proph - has been on and off antibiotics a lot over the last few months. Will check G6PD today in anticipation of starting Dapsone. No concerning signs for OI today. Follow up with Dr. [name] in 3 months.       Relevant Orders   Comprehensive metabolic panel (Completed)   CBC with Differential/Platelet (Completed)   Hepatitis B surface antigen (Completed)   Hepatitis B surface antibody (Completed)   Hepatitis C antibody (Completed)   HIV 1 RNA quant-no reflex-bld (Completed)   T-helper cell (CD4)- ([facility] clinic only)   Glucose 6 phosphate dehydrogenase (Completed)   Pain in left hip      I spent 45 minutes with the patient including greater than 50% of time in face to face counsel of the patient re HIV, potential for life-threatening infection risk, hidradenitis suppurativa and multiple chronic conditions and in coordination of their care with dermatology and primary care teams.   Janene Madeira, MSN, NP-C [place] [facility]  11/20/16 9:04 AM

## 2016-11-19 NOTE — Assessment & Plan Note (Addendum)
Apparently this is severe case that has been refractory to oral antibiotics and topical clinidamycin gel. He tells me that prior to his lapse in his ART prior to coming to clinic this had been under better control.   Seen by general surgery - unable to be performed to help with management at all sites of involvement, particularly the groin and scrotal areas. CD4 continues to be slow to reconstitute in setting of virologic suppression and active/ongoing infections. Would continue fluoroquinolone considering previous culture with pseudomonas.   His dermatology team would like to start him on Humira. I discussed with Allen Espinoza today that should this regimen be added it will place him at even higher risk for invasive fungal and bacterial infections, tuberculosis and lymphoma being he is also HIV+ with suboptimal immune response. In reviewing case studies on use of these agents with HIV + patients it has almost always been in settings of higher CD4 counts. Although there is no interactions with current ART regimen, unfortunately I do not think this is a safe option to start this for him with potentially life-threatening infection risk. He has failed protracted oral regimens of minocycline, clindamycin gel and surgery team is unable to treat his groin/scrotal region. He is awaiting referral to specialist in [facility] for HS medical management.

## 2016-11-19 NOTE — Assessment & Plan Note (Signed)
Planned for total hip replacement with Dr. Ninfa Linden next week. He is looking forward to this surgery. Recently demonstrated virologic suppression and has reported excellent adherence of current regimen. Will reassess again today with upcoming surgery.

## 2016-11-20 ENCOUNTER — Other Ambulatory Visit (INDEPENDENT_AMBULATORY_CARE_PROVIDER_SITE_OTHER): Payer: Self-pay | Admitting: Orthopaedic Surgery

## 2016-11-20 ENCOUNTER — Encounter (HOSPITAL_COMMUNITY): Payer: Self-pay

## 2016-11-20 LAB — COMPREHENSIVE METABOLIC PANEL
ALK PHOS: 63 U/L (ref 40–115)
ALT: 7 U/L — AB (ref 9–46)
AST: 11 U/L (ref 10–35)
Albumin: 3.6 g/dL (ref 3.6–5.1)
BUN: 14 mg/dL (ref 7–25)
CHLORIDE: 110 mmol/L (ref 98–110)
CO2: 22 mmol/L (ref 20–32)
Calcium: 8.7 mg/dL (ref 8.6–10.3)
Creat: 1.39 mg/dL — ABNORMAL HIGH (ref 0.70–1.33)
Glucose, Bld: 101 mg/dL — ABNORMAL HIGH (ref 65–99)
Potassium: 4.4 mmol/L (ref 3.5–5.3)
Sodium: 142 mmol/L (ref 135–146)
TOTAL PROTEIN: 7.7 g/dL (ref 6.1–8.1)
Total Bilirubin: 0.3 mg/dL (ref 0.2–1.2)

## 2016-11-20 LAB — HEPATITIS C ANTIBODY: HCV Ab: NONREACTIVE

## 2016-11-20 LAB — HEPATITIS B SURFACE ANTIBODY,QUALITATIVE: HEP B S AB: REACTIVE — AB

## 2016-11-20 LAB — HEPATITIS B SURFACE ANTIGEN: Hepatitis B Surface Ag: NONREACTIVE

## 2016-11-20 NOTE — Progress Notes (Signed)
Please place orders in epic pt. Has a preop appt. On 11/22/16 at 1100 am . Thank you!

## 2016-11-20 NOTE — Progress Notes (Signed)
Echo 10/08/16 epic   ekg 10/08/16 epic

## 2016-11-20 NOTE — Patient Instructions (Addendum)
Allen Espinoza  11/20/2016   Your procedure is scheduled on:  11/29/16  Report to [facility] Main  Entrance             Report to admitting at     0900 AM   Call this number if you have problems the morning of surgery  [phone]   Remember: ONLY 1 PERSON MAY GO WITH YOU TO SHORT STAY TO GET  READY MORNING OF YOUR SURGERY.  Do not eat food or drink liquids :After Midnight.     Take these medicines the morning of surgery with A SIP OF WATER: prevista, genvoya, minocycline                                 You may not have any metal on your body including hair pins and              piercings  Do not wear jewelry, lotions, powders or perfumes, deodorant                       Men may shave face and neck.   Do not bring valuables to the hospital. [place].  Contacts, dentures or bridgework may not be worn into surgery.  Leave suitcase in the car. After surgery it may be brought to your room.               Please read over the following fact sheets you were given: _____________________________________________________________________         Operating Room Services - Preparing for Surgery Before surgery, you can play an important role.  Because skin is not sterile, your skin needs to be as free of germs as possible.  You can reduce the number of germs on your skin by washing with CHG (chlorahexidine gluconate) soap before surgery.  CHG is an antiseptic cleaner which kills germs and bonds with the skin to continue killing germs even after washing. Please DO NOT use if you have an allergy to CHG or antibacterial soaps.  If your skin becomes reddened/irritated stop using the CHG and inform your nurse when you arrive at Short Stay. Do not shave (including legs and underarms) for at least 48 hours prior to the first CHG shower.  You may shave your face/neck. Please follow these instructions carefully:  1.  Shower with CHG Soap the  night before surgery and the  morning of Surgery.  2.  If you choose to wash your hair, wash your hair first as usual with your  normal  shampoo.  3.  After you shampoo, rinse your hair and body thoroughly to remove the  shampoo.                           4.  Use CHG as you would any other liquid soap.  You can apply chg directly  to the skin and wash                       Gently with a scrungie or clean washcloth.  5.  Apply the CHG Soap to your body ONLY FROM THE NECK DOWN.   Do not  use on face/ open                           Wound or open sores. Avoid contact with eyes, ears mouth and genitals (private parts).                       Wash face,  Genitals (private parts) with your normal soap.             6.  Wash thoroughly, paying special attention to the area where your surgery  will be performed.  7.  Thoroughly rinse your body with warm water from the neck down.  8.  DO NOT shower/wash with your normal soap after using and rinsing off  the CHG Soap.                9.  Pat yourself dry with a clean towel.            10.  Wear clean pajamas.            11.  Place clean sheets on your bed the night of your first shower and do not  sleep with pets. Day of Surgery : Do not apply any lotions/deodorants the morning of surgery.  Please wear clean clothes to the hospital/surgery center.  FAILURE TO FOLLOW THESE INSTRUCTIONS MAY RESULT IN THE CANCELLATION OF YOUR SURGERY PATIENT SIGNATURE_________________________________  NURSE SIGNATURE__________________________________  ________________________________________________________________________  WHAT IS A BLOOD TRANSFUSION? Blood Transfusion Information  A transfusion is the replacement of blood or some of its parts. Blood is made up of multiple cells which provide different functions.  Red blood cells carry oxygen and are used for blood loss replacement.  White blood cells fight against infection.  Platelets control bleeding.  Plasma  helps clot blood.  Other blood products are available for specialized needs, such as hemophilia or other clotting disorders. BEFORE THE TRANSFUSION  Who gives blood for transfusions?   Healthy volunteers who are fully evaluated to make sure their blood is safe. This is blood bank blood. Transfusion therapy is the safest it has ever been in the practice of medicine. Before blood is taken from a donor, a complete history is taken to make sure that person has no history of diseases nor engages in risky social behavior (examples are intravenous drug use or sexual activity with multiple partners). The donor's travel history is screened to minimize risk of transmitting infections, such as malaria. The donated blood is tested for signs of infectious diseases, such as HIV and hepatitis. The blood is then tested to be sure it is compatible with you in order to minimize the chance of a transfusion reaction. If you or a relative donates blood, this is often done in anticipation of surgery and is not appropriate for emergency situations. It takes many days to process the donated blood. RISKS AND COMPLICATIONS Although transfusion therapy is very safe and saves many lives, the main dangers of transfusion include:   Getting an infectious disease.  Developing a transfusion reaction. This is an allergic reaction to something in the blood you were given. Every precaution is taken to prevent this. The decision to have a blood transfusion has been considered carefully by your caregiver before blood is given. Blood is not given unless the benefits outweigh the risks. AFTER THE TRANSFUSION  Right after receiving a blood transfusion, you will usually feel much better and more energetic. This is especially true if  your red blood cells have gotten low (anemic). The transfusion raises the level of the red blood cells which carry oxygen, and this usually causes an energy increase.  The nurse administering the transfusion  will monitor you carefully for complications. HOME CARE INSTRUCTIONS  No special instructions are needed after a transfusion. You may find your energy is better. Speak with your caregiver about any limitations on activity for underlying diseases you may have. SEEK MEDICAL CARE IF:   Your condition is not improving after your transfusion.  You develop redness or irritation at the intravenous (IV) site. SEEK IMMEDIATE MEDICAL CARE IF:  Any of the following symptoms occur over the next 12 hours:  Shaking chills.  You have a temperature by mouth above 102 F (38.9 C), not controlled by medicine.  Chest, back, or muscle pain.  People around you feel you are not acting correctly or are confused.  Shortness of breath or difficulty breathing.  Dizziness and fainting.  You get a rash or develop hives.  You have a decrease in urine output.  Your urine turns a dark color or changes to pink, red, or brown. Any of the following symptoms occur over the next 10 days:  You have a temperature by mouth above 102 F (38.9 C), not controlled by medicine.  Shortness of breath.  Weakness after normal activity.  The white part of the eye turns yellow (jaundice).  You have a decrease in the amount of urine or are urinating less often.  Your urine turns a dark color or changes to pink, red, or brown. Document Released: 03/15/2000 Document Revised: 06/10/2011 Document Reviewed: 11/02/2007 ExitCare Patient Information 2014 [place].  _______________________________________________________________________  Incentive Spirometer  An incentive spirometer is a tool that can help keep your lungs clear and active. This tool measures how well you are filling your lungs with each breath. Taking long deep breaths may help reverse or decrease the chance of developing breathing (pulmonary) problems (especially infection) following:  A long period of time when you are unable to move or be  active. BEFORE THE PROCEDURE   If the spirometer includes an indicator to show your best effort, your nurse or respiratory therapist will set it to a desired goal.  If possible, sit up straight or lean slightly forward. Try not to slouch.  Hold the incentive spirometer in an upright position. INSTRUCTIONS FOR USE  1. Sit on the edge of your bed if possible, or sit up as far as you can in bed or on a chair. 2. Hold the incentive spirometer in an upright position. 3. Breathe out normally. 4. Place the mouthpiece in your mouth and seal your lips tightly around it. 5. Breathe in slowly and as deeply as possible, raising the piston or the ball toward the top of the column. 6. Hold your breath for 3-5 seconds or for as long as possible. Allow the piston or ball to fall to the bottom of the column. 7. Remove the mouthpiece from your mouth and breathe out normally. 8. Rest for a few seconds and repeat Steps 1 through 7 at least 10 times every 1-2 hours when you are awake. Take your time and take a few normal breaths between deep breaths. 9. The spirometer may include an indicator to show your best effort. Use the indicator as a goal to work toward during each repetition. 10. After each set of 10 deep breaths, practice coughing to be sure your lungs are clear. If you have an incision (  the cut made at the time of surgery), support your incision when coughing by placing a pillow or rolled up towels firmly against it. Once you are able to get out of bed, walk around indoors and cough well. You may stop using the incentive spirometer when instructed by your caregiver.  RISKS AND COMPLICATIONS  Take your time so you do not get dizzy or light-headed.  If you are in pain, you may need to take or ask for pain medication before doing incentive spirometry. It is harder to take a deep breath if you are having pain. AFTER USE  Rest and breathe slowly and easily.  It can be helpful to keep track of a log of  your progress. Your caregiver can provide you with a simple table to help with this. If you are using the spirometer at home, follow these instructions: [place] IF:   You are having difficultly using the spirometer.  You have trouble using the spirometer as often as instructed.  Your pain medication is not giving enough relief while using the spirometer.  You develop fever of 100.5 F (38.1 C) or higher. SEEK IMMEDIATE MEDICAL CARE IF:   You cough up bloody sputum that had not been present before.  You develop fever of 102 F (38.9 C) or greater.  You develop worsening pain at or near the incision site. MAKE SURE YOU:   Understand these instructions.  Will watch your condition.  Will get help right away if you are not doing well or get worse. Document Released: 07/29/2006 Document Revised: 06/10/2011 Document Reviewed: 09/29/2006 [facility] Patient Information 2014 [place], [place].   ________________________________________________________________________

## 2016-11-20 NOTE — Progress Notes (Signed)
Please place orders in EPIC as patient has a pre-op appointment on 11/22/2016! Thank you!

## 2016-11-21 ENCOUNTER — Other Ambulatory Visit (INDEPENDENT_AMBULATORY_CARE_PROVIDER_SITE_OTHER): Payer: Self-pay | Admitting: Physician Assistant

## 2016-11-21 ENCOUNTER — Encounter: Payer: Self-pay | Admitting: Infectious Diseases

## 2016-11-22 ENCOUNTER — Encounter (HOSPITAL_COMMUNITY): Payer: Self-pay

## 2016-11-22 ENCOUNTER — Telehealth (INDEPENDENT_AMBULATORY_CARE_PROVIDER_SITE_OTHER): Payer: Self-pay | Admitting: Orthopaedic Surgery

## 2016-11-22 ENCOUNTER — Other Ambulatory Visit: Payer: Self-pay | Admitting: Internal Medicine

## 2016-11-22 ENCOUNTER — Encounter (HOSPITAL_COMMUNITY)
Admission: RE | Admit: 2016-11-22 | Discharge: 2016-11-22 | Disposition: A | Discharge status: Home / Self Care | Payer: Medicaid Other | Source: Ambulatory Visit | Attending: Orthopaedic Surgery | Admitting: Orthopaedic Surgery

## 2016-11-22 DIAGNOSIS — Z01818 Encounter for other preprocedural examination: Secondary | ICD-10-CM | POA: Insufficient documentation

## 2016-11-22 DIAGNOSIS — M87052 Idiopathic aseptic necrosis of left femur: Secondary | ICD-10-CM | POA: Insufficient documentation

## 2016-11-22 DIAGNOSIS — L732 Hidradenitis suppurativa: Secondary | ICD-10-CM

## 2016-11-22 LAB — SURGICAL PCR SCREEN
MRSA, PCR: NEGATIVE
Staphylococcus aureus: NEGATIVE

## 2016-11-22 LAB — HIV-1 RNA QUANT-NO REFLEX-BLD
HIV 1 RNA Quant: <20 copies/mL
HIV-1 RNA Quant, Log: <1.3 Log copies/mL

## 2016-11-22 LAB — CBC
HCT: 30.3 % — ABNORMAL LOW (ref 39.0–52.0)
Hemoglobin: 9.9 g/dL — ABNORMAL LOW (ref 13.0–17.0)
MCH: 30.6 pg (ref 26.0–34.0)
MCHC: 32.7 g/dL (ref 30.0–36.0)
MCV: 93.5 fL (ref 78.0–100.0)
PLATELETS: 313 10*3/uL (ref 150–400)
RBC: 3.24 MIL/uL — ABNORMAL LOW (ref 4.22–5.81)
RDW: 15.3 % (ref 11.5–15.5)
WBC: 8.7 10*3/uL (ref 4.0–10.5)

## 2016-11-22 LAB — ABO/RH: ABO/RH(D): B POS

## 2016-11-22 LAB — GLUCOSE 6 PHOSPHATE DEHYDROGENASE

## 2016-11-22 NOTE — Progress Notes (Signed)
Cbc done 11/22/16 routed to Dr. Ninfa Linden and Lemar Lofty via epic.

## 2016-11-22 NOTE — Telephone Encounter (Signed)
His last dose should be Monday 8/27.

## 2016-11-22 NOTE — Progress Notes (Signed)
Pt. Reported at preop he has not been given instructions on stopping his xarelto prior to surgery. Instructed pt. To call Dr. Trevor Mace office regarding instructions.

## 2016-11-22 NOTE — Telephone Encounter (Signed)
Pt called wants to know when to stop taking (XARELTO  prior to surgery on 11/29/16. Please give pt call back with info.

## 2016-11-22 NOTE — Telephone Encounter (Signed)
Patient aware of the below message  

## 2016-11-22 NOTE — Assessment & Plan Note (Addendum)
ID requested [facility] referral since Humeria cannot be used at this time.

## 2016-11-22 NOTE — Telephone Encounter (Signed)
Please advise 

## 2016-11-28 ENCOUNTER — Telehealth: Payer: Self-pay

## 2016-11-28 MED ORDER — DEXTROSE 5 % IV SOLN
3.0000 g | INTRAVENOUS | Status: DC
  Filled 2016-11-28: qty 3000

## 2016-11-28 NOTE — Progress Notes (Signed)
Pt. Made aware of time change for surgery . Time is now 9:30-7:30 and pt. Will report to admitting at 0700. Pt. Verbalized understanding.

## 2016-11-28 NOTE — Telephone Encounter (Signed)
I called [name], Allen Espinoza to schedule him appointment for a lab visit since we did not get a CD4 count on him, however, he informed me that he would be going to [facility] on 11/29/2016 at 7a.m. I told him that I would hold off on the lab visit and call back and see if he would still need a lab visit with [place].

## 2016-11-29 ENCOUNTER — Inpatient Hospital Stay (HOSPITAL_COMMUNITY): Payer: Medicaid Other | Admitting: Anesthesiology

## 2016-11-29 ENCOUNTER — Inpatient Hospital Stay (HOSPITAL_COMMUNITY): Payer: Medicaid Other

## 2016-11-29 ENCOUNTER — Encounter (HOSPITAL_COMMUNITY): Payer: Self-pay

## 2016-11-29 ENCOUNTER — Inpatient Hospital Stay (HOSPITAL_COMMUNITY)
Admission: RE | Admit: 2016-11-29 | Discharge: 2016-12-03 | DRG: 470 | Disposition: A | Discharge status: Home Health | Payer: Medicaid Other | Source: Ambulatory Visit | Attending: Orthopaedic Surgery | Admitting: Orthopaedic Surgery

## 2016-11-29 ENCOUNTER — Encounter (HOSPITAL_COMMUNITY)
Admission: RE | Disposition: A | Discharge status: Home Health | Payer: Self-pay | Source: Ambulatory Visit | Attending: Orthopaedic Surgery

## 2016-11-29 DIAGNOSIS — M87052 Idiopathic aseptic necrosis of left femur: Secondary | ICD-10-CM | POA: Diagnosis not present

## 2016-11-29 DIAGNOSIS — Z881 Allergy status to other antibiotic agents status: Secondary | ICD-10-CM

## 2016-11-29 DIAGNOSIS — M1990 Unspecified osteoarthritis, unspecified site: Secondary | ICD-10-CM | POA: Diagnosis present

## 2016-11-29 DIAGNOSIS — K219 Gastro-esophageal reflux disease without esophagitis: Secondary | ICD-10-CM | POA: Diagnosis present

## 2016-11-29 DIAGNOSIS — Z79899 Other long term (current) drug therapy: Secondary | ICD-10-CM

## 2016-11-29 DIAGNOSIS — Z21 Asymptomatic human immunodeficiency virus [HIV] infection status: Secondary | ICD-10-CM | POA: Diagnosis present

## 2016-11-29 DIAGNOSIS — Z96642 Presence of left artificial hip joint: Secondary | ICD-10-CM | POA: Diagnosis not present

## 2016-11-29 DIAGNOSIS — R269 Unspecified abnormalities of gait and mobility: Secondary | ICD-10-CM | POA: Diagnosis not present

## 2016-11-29 DIAGNOSIS — Z79891 Long term (current) use of opiate analgesic: Secondary | ICD-10-CM | POA: Diagnosis not present

## 2016-11-29 DIAGNOSIS — M1612 Unilateral primary osteoarthritis, left hip: Principal | ICD-10-CM | POA: Diagnosis present

## 2016-11-29 DIAGNOSIS — M8788 Other osteonecrosis, other site: Secondary | ICD-10-CM | POA: Diagnosis present

## 2016-11-29 DIAGNOSIS — L732 Hidradenitis suppurativa: Secondary | ICD-10-CM | POA: Diagnosis present

## 2016-11-29 DIAGNOSIS — N183 Chronic kidney disease, stage 3 (moderate): Secondary | ICD-10-CM | POA: Diagnosis present

## 2016-11-29 DIAGNOSIS — Z6841 Body Mass Index (BMI) 40.0 and over, adult: Secondary | ICD-10-CM | POA: Diagnosis not present

## 2016-11-29 DIAGNOSIS — Z419 Encounter for procedure for purposes other than remedying health state, unspecified: Secondary | ICD-10-CM

## 2016-11-29 DIAGNOSIS — Z882 Allergy status to sulfonamides status: Secondary | ICD-10-CM

## 2016-11-29 DIAGNOSIS — D62 Acute posthemorrhagic anemia: Secondary | ICD-10-CM | POA: Diagnosis not present

## 2016-11-29 DIAGNOSIS — Z86718 Personal history of other venous thrombosis and embolism: Secondary | ICD-10-CM | POA: Diagnosis not present

## 2016-11-29 HISTORY — PX: TOTAL HIP ARTHROPLASTY: SHX124

## 2016-11-29 IMAGING — DX DG PORTABLE PELVIS
1 series · 1 of 1 positions shown · non-contrast
Comparison: Pelvic x-rays dated [DATE].

CLINICAL DATA: Left total hip replacement.

EXAM:
PORTABLE PELVIS 1-2 VIEWS

[pelvis ap]
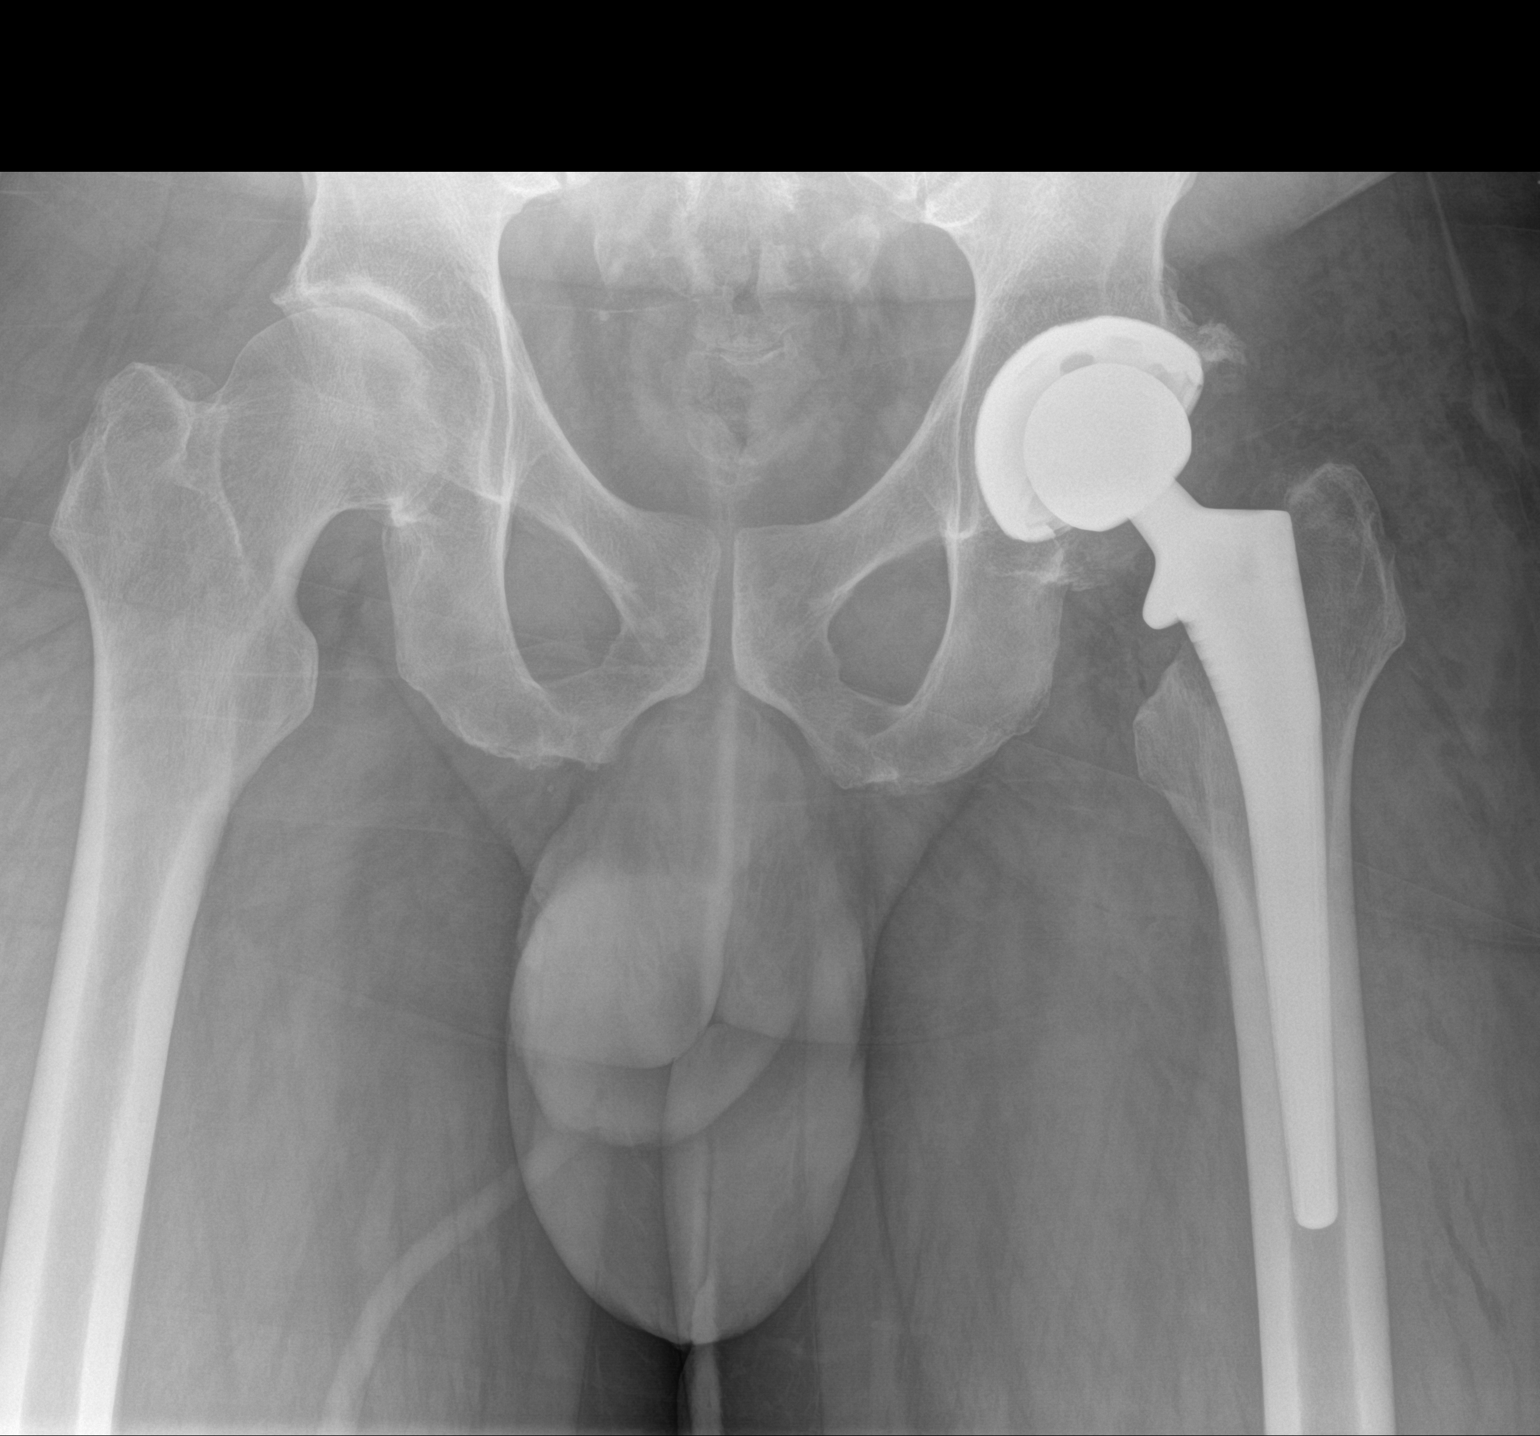

[1 of 1 positions shown; findings below may reference images not displayed]

FINDINGS: Postsurgical changes related to interval left total hip
arthroplasty. Components are well aligned. No acute fracture or
malalignment. Expected postsurgical changes including subcutaneous
and intra-articular emphysema. Mild degenerative changes of the
right hip joint.
IMPRESSION: Left total hip arthroplasty without evidence of acute postoperative
complication.

These results will be called to the ordering clinician or
representative by the [HOSPITAL] at the imaging location.

## 2016-11-29 IMAGING — RF DG HIP (WITH PELVIS) OPERATIVE*L*
1 series · 2 of 2 positions shown · non-contrast
Comparison: Pelvic x-rays dated [DATE].

CLINICAL DATA: Left total hip replacement.

EXAM:
OPERATIVE LEFT HIP (WITH PELVIS IF PERFORMED) 1 VIEW
TECHNIQUE: Fluoroscopic spot image(s) were submitted for interpretation
post-operatively. Fluoroscopy time was 0.4 minutes.

[Series 1: run · 2 of 2 slices shown]
[im 1/2]
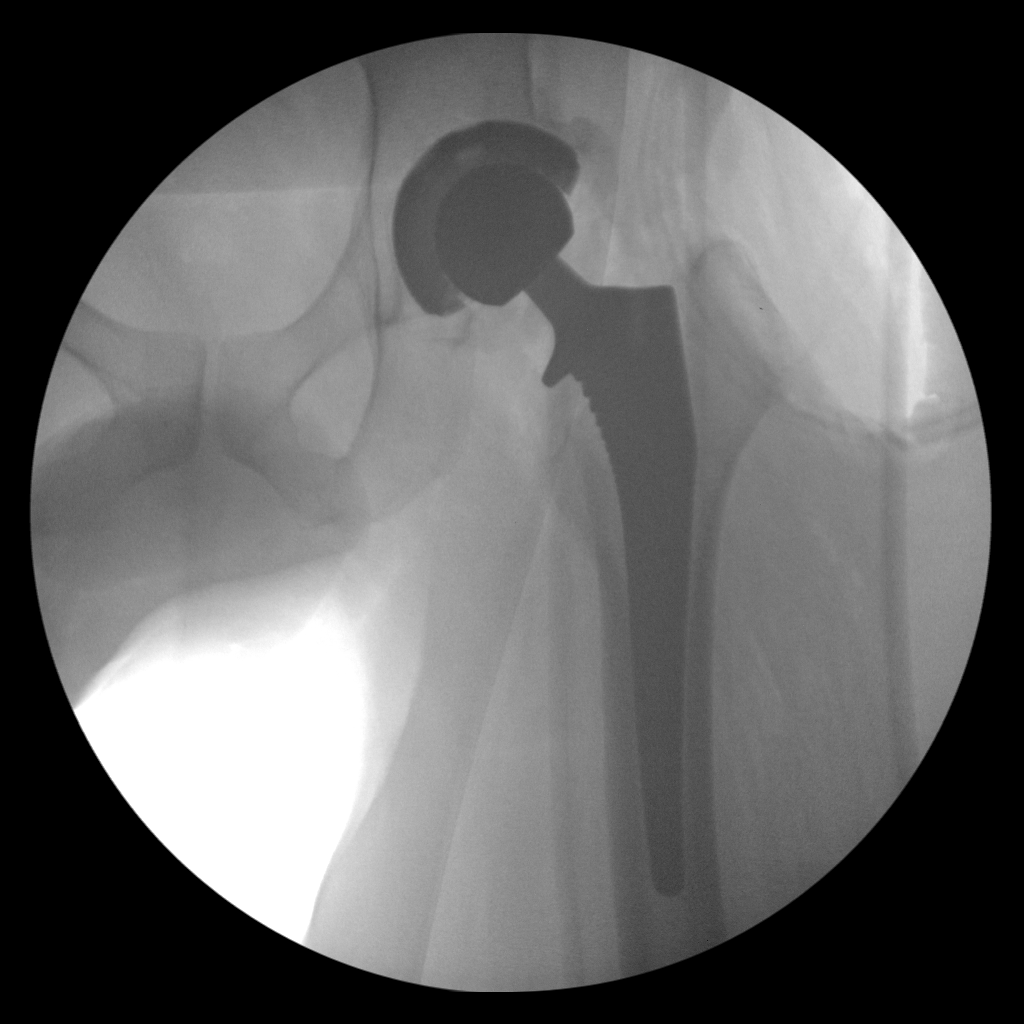
[im 2/2]
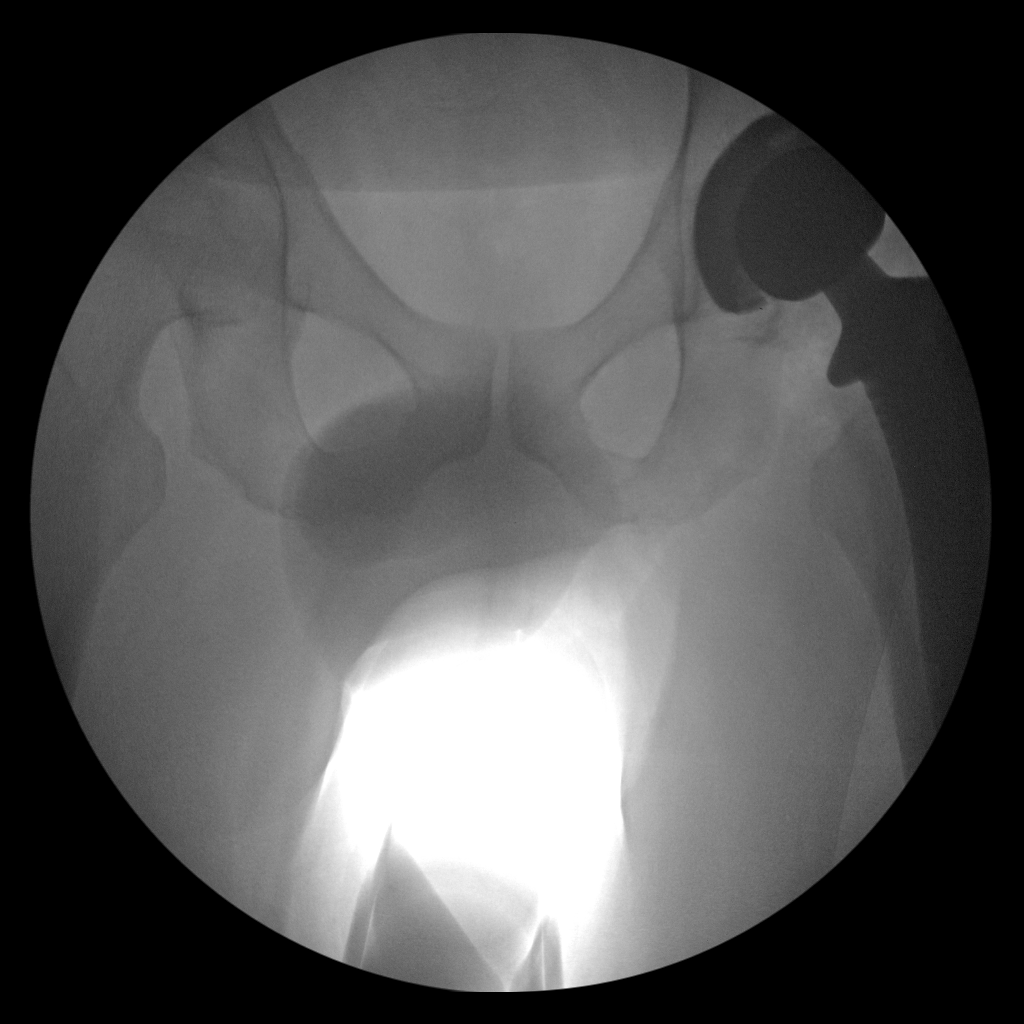

[2 of 2 positions shown; findings below may reference images not displayed]

FINDINGS: Intraoperative x-rays demonstrating interval left total hip
arthroplasty. Components are well aligned. No definite complication.
IMPRESSION: Interval left total hip arthroplasty.

## 2016-11-29 SURGERY — ARTHROPLASTY, HIP, TOTAL, ANTERIOR APPROACH
Anesthesia: Spinal | Site: Hip | Laterality: Left

## 2016-11-29 MED ORDER — PHENYLEPHRINE HCL 10 MG/ML IJ SOLN
INTRAVENOUS | Status: DC | PRN
  Administered 2016-11-29: 25 ug/min via INTRAVENOUS

## 2016-11-29 MED ORDER — SODIUM CHLORIDE 0.9 % IR SOLN
Status: DC | PRN
  Administered 2016-11-29: 1000 mL

## 2016-11-29 MED ORDER — ELVITEG-COBIC-EMTRICIT-TENOFAF 150-150-200-10 MG PO TABS
1.0000 | ORAL_TABLET | Freq: Every day | ORAL | Status: DC
  Administered 2016-11-29 – 2016-12-03 (×5): 1 via ORAL
  Filled 2016-11-29 (×5): qty 1

## 2016-11-29 MED ORDER — METHOCARBAMOL 500 MG PO TABS
500.0000 mg | ORAL_TABLET | Freq: Four times a day (QID) | ORAL | Status: DC | PRN
  Administered 2016-11-29 – 2016-12-02 (×3): 500 mg via ORAL
  Filled 2016-11-29 (×3): qty 1

## 2016-11-29 MED ORDER — MIDAZOLAM HCL 5 MG/5ML IJ SOLN
INTRAMUSCULAR | Status: DC | PRN
  Administered 2016-11-29: 2 mg via INTRAVENOUS

## 2016-11-29 MED ORDER — ONDANSETRON HCL 4 MG/2ML IJ SOLN
INTRAMUSCULAR | Status: AC
  Filled 2016-11-29: qty 2

## 2016-11-29 MED ORDER — PHENYLEPHRINE 40 MCG/ML (10ML) SYRINGE FOR IV PUSH (FOR BLOOD PRESSURE SUPPORT)
PREFILLED_SYRINGE | INTRAVENOUS | Status: AC
  Filled 2016-11-29: qty 20

## 2016-11-29 MED ORDER — RIVAROXABAN 10 MG PO TABS
20.0000 mg | ORAL_TABLET | Freq: Every morning | ORAL | Status: DC
  Administered 2016-11-30 – 2016-12-03 (×4): 20 mg via ORAL
  Filled 2016-11-29 (×4): qty 2

## 2016-11-29 MED ORDER — PROMETHAZINE HCL 25 MG/ML IJ SOLN: 6.2500 mg | INTRAMUSCULAR | Status: DC | PRN

## 2016-11-29 MED ORDER — FENTANYL CITRATE (PF) 100 MCG/2ML IJ SOLN
INTRAMUSCULAR | Status: DC | PRN
  Administered 2016-11-29 (×2): 50 ug via INTRAVENOUS

## 2016-11-29 MED ORDER — POLYETHYLENE GLYCOL 3350 17 G PO PACK
17.0000 g | PACK | Freq: Every day | ORAL | Status: DC | PRN
  Administered 2016-12-01: 17 g via ORAL
  Filled 2016-11-29: qty 1

## 2016-11-29 MED ORDER — MENTHOL 3 MG MT LOZG: 1.0000 | LOZENGE | OROMUCOSAL | Status: DC | PRN

## 2016-11-29 MED ORDER — HYDROMORPHONE HCL-NACL 0.5-0.9 MG/ML-% IV SOSY: 0.2500 mg | PREFILLED_SYRINGE | INTRAVENOUS | Status: DC | PRN

## 2016-11-29 MED ORDER — ONDANSETRON HCL 4 MG/2ML IJ SOLN: 4.0000 mg | Freq: Four times a day (QID) | INTRAMUSCULAR | Status: DC | PRN

## 2016-11-29 MED ORDER — MINOCYCLINE HCL 50 MG PO CAPS
50.0000 mg | ORAL_CAPSULE | Freq: Two times a day (BID) | ORAL | Status: DC
  Administered 2016-11-29 – 2016-12-03 (×8): 50 mg via ORAL
  Filled 2016-11-29 (×8): qty 1

## 2016-11-29 MED ORDER — OXYCODONE HCL 5 MG PO TABS
5.0000 mg | ORAL_TABLET | ORAL | Status: DC | PRN
  Administered 2016-11-29 (×2): 10 mg via ORAL
  Administered 2016-11-30 – 2016-12-02 (×6): 15 mg via ORAL
  Administered 2016-12-02: 5 mg via ORAL
  Administered 2016-12-02 – 2016-12-03 (×2): 15 mg via ORAL
  Filled 2016-11-29 (×2): qty 3
  Filled 2016-11-29: qty 2
  Filled 2016-11-29 (×2): qty 3
  Filled 2016-11-29: qty 2
  Filled 2016-11-29 (×4): qty 3
  Filled 2016-11-29: qty 1

## 2016-11-29 MED ORDER — GLYCOPYRROLATE 0.2 MG/ML IV SOSY
PREFILLED_SYRINGE | INTRAVENOUS | Status: DC | PRN
  Administered 2016-11-29: .3 mg via INTRAVENOUS

## 2016-11-29 MED ORDER — CHLORHEXIDINE GLUCONATE 4 % EX LIQD: 60.0000 mL | Freq: Once | CUTANEOUS | Status: DC

## 2016-11-29 MED ORDER — EPHEDRINE SULFATE-NACL 50-0.9 MG/10ML-% IV SOSY
PREFILLED_SYRINGE | INTRAVENOUS | Status: DC | PRN
  Administered 2016-11-29: 10 mg via INTRAVENOUS
  Administered 2016-11-29 (×2): 5 mg via INTRAVENOUS

## 2016-11-29 MED ORDER — PROPOFOL 500 MG/50ML IV EMUL
INTRAVENOUS | Status: DC | PRN
  Administered 2016-11-29: 50 ug/kg/min via INTRAVENOUS

## 2016-11-29 MED ORDER — ACETAMINOPHEN 650 MG RE SUPP: 650.0000 mg | Freq: Four times a day (QID) | RECTAL | Status: DC | PRN

## 2016-11-29 MED ORDER — FENTANYL CITRATE (PF) 100 MCG/2ML IJ SOLN
INTRAMUSCULAR | Status: AC
Start: 2016-11-29 — End: ?
  Filled 2016-11-29: qty 2

## 2016-11-29 MED ORDER — ACETAMINOPHEN 325 MG PO TABS
650.0000 mg | ORAL_TABLET | Freq: Four times a day (QID) | ORAL | Status: DC | PRN
Start: 2016-11-29 — End: 2016-12-03
  Administered 2016-12-01: 16:00:00 650 mg via ORAL
  Filled 2016-11-29: qty 2

## 2016-11-29 MED ORDER — ONDANSETRON HCL 4 MG PO TABS: 4.0000 mg | ORAL_TABLET | Freq: Four times a day (QID) | ORAL | Status: DC | PRN

## 2016-11-29 MED ORDER — HYDROMORPHONE HCL-NACL 0.5-0.9 MG/ML-% IV SOSY
1.0000 mg | PREFILLED_SYRINGE | INTRAVENOUS | Status: DC | PRN
  Administered 2016-11-29: 15:00:00 1 mg via INTRAVENOUS
  Filled 2016-11-29: qty 2

## 2016-11-29 MED ORDER — PHENOL 1.4 % MT LIQD: 1.0000 | OROMUCOSAL | Status: DC | PRN

## 2016-11-29 MED ORDER — CEFAZOLIN SODIUM-DEXTROSE 2-4 GM/100ML-% IV SOLN
2.0000 g | Freq: Four times a day (QID) | INTRAVENOUS | Status: AC
  Administered 2016-11-29 (×2): 2 g via INTRAVENOUS
  Filled 2016-11-29 (×2): qty 100

## 2016-11-29 MED ORDER — ALUM & MAG HYDROXIDE-SIMETH 200-200-20 MG/5ML PO SUSP: 30.0000 mL | ORAL | Status: DC | PRN

## 2016-11-29 MED ORDER — GLYCOPYRROLATE 0.2 MG/ML IV SOSY
PREFILLED_SYRINGE | INTRAVENOUS | Status: AC
  Filled 2016-11-29: qty 5

## 2016-11-29 MED ORDER — STERILE WATER FOR IRRIGATION IR SOLN
Status: DC | PRN
  Administered 2016-11-29: 2000 mL

## 2016-11-29 MED ORDER — METOCLOPRAMIDE HCL 5 MG PO TABS: 5.0000 mg | ORAL_TABLET | Freq: Three times a day (TID) | ORAL | Status: DC | PRN

## 2016-11-29 MED ORDER — DEXAMETHASONE SODIUM PHOSPHATE 10 MG/ML IJ SOLN
INTRAMUSCULAR | Status: AC
  Filled 2016-11-29: qty 1

## 2016-11-29 MED ORDER — CEFAZOLIN SODIUM-DEXTROSE 1-4 GM/50ML-% IV SOLN
1.0000 g | INTRAVENOUS | Status: AC
  Administered 2016-11-29: 1 g via INTRAVENOUS
  Filled 2016-11-29: qty 50

## 2016-11-29 MED ORDER — METOCLOPRAMIDE HCL 5 MG/ML IJ SOLN: 5.0000 mg | Freq: Three times a day (TID) | INTRAMUSCULAR | Status: DC | PRN

## 2016-11-29 MED ORDER — BUPIVACAINE HCL (PF) 0.5 % IJ SOLN
INTRAMUSCULAR | Status: DC | PRN
  Administered 2016-11-29: 3 mL

## 2016-11-29 MED ORDER — PROPOFOL 10 MG/ML IV BOLUS
INTRAVENOUS | Status: AC
  Filled 2016-11-29: qty 60

## 2016-11-29 MED ORDER — DOCUSATE SODIUM 100 MG PO CAPS
100.0000 mg | ORAL_CAPSULE | Freq: Two times a day (BID) | ORAL | Status: DC
  Administered 2016-11-29 – 2016-12-03 (×8): 100 mg via ORAL
  Filled 2016-11-29 (×8): qty 1

## 2016-11-29 MED ORDER — ONDANSETRON HCL 4 MG/2ML IJ SOLN
INTRAMUSCULAR | Status: DC | PRN
  Administered 2016-11-29: 4 mg via INTRAVENOUS

## 2016-11-29 MED ORDER — METHOCARBAMOL 1000 MG/10ML IJ SOLN
500.0000 mg | Freq: Four times a day (QID) | INTRAVENOUS | Status: DC | PRN
  Filled 2016-11-29: qty 5

## 2016-11-29 MED ORDER — CEFAZOLIN SODIUM-DEXTROSE 2-4 GM/100ML-% IV SOLN
2.0000 g | INTRAVENOUS | Status: AC
  Administered 2016-11-29: 2 g via INTRAVENOUS

## 2016-11-29 MED ORDER — DARUNAVIR ETHANOLATE 800 MG PO TABS
800.0000 mg | ORAL_TABLET | Freq: Every day | ORAL | Status: DC
  Administered 2016-11-29 – 2016-12-03 (×5): 800 mg via ORAL
  Filled 2016-11-29 (×5): qty 1

## 2016-11-29 MED ORDER — EPHEDRINE 5 MG/ML INJ
INTRAVENOUS | Status: AC
  Filled 2016-11-29: qty 10

## 2016-11-29 MED ORDER — DEXAMETHASONE SODIUM PHOSPHATE 10 MG/ML IJ SOLN
INTRAMUSCULAR | Status: DC | PRN
  Administered 2016-11-29: 10 mg via INTRAVENOUS

## 2016-11-29 MED ORDER — LACTATED RINGERS IV SOLN
INTRAVENOUS | Status: DC
  Administered 2016-11-29: 10:00:00 via INTRAVENOUS
  Administered 2016-11-29: 1000 mL via INTRAVENOUS
  Administered 2016-11-29: 08:00:00 via INTRAVENOUS

## 2016-11-29 MED ORDER — CEFAZOLIN SODIUM-DEXTROSE 2-4 GM/100ML-% IV SOLN
INTRAVENOUS | Status: AC
  Filled 2016-11-29: qty 100

## 2016-11-29 MED ORDER — SODIUM CHLORIDE 0.9 % IV SOLN
INTRAVENOUS | Status: DC
  Administered 2016-11-29: 16:00:00 via INTRAVENOUS

## 2016-11-29 MED ORDER — MIDAZOLAM HCL 2 MG/2ML IJ SOLN
INTRAMUSCULAR | Status: AC
  Filled 2016-11-29: qty 2

## 2016-11-29 MED ORDER — DIPHENHYDRAMINE HCL 12.5 MG/5ML PO ELIX: 12.5000 mg | ORAL_SOLUTION | ORAL | Status: DC | PRN

## 2016-11-29 SURGICAL SUPPLY — 42 items
BAG ZIPLOCK 12X15 (MISCELLANEOUS) ×3 IMPLANT
BENZOIN TINCTURE PRP APPL 2/3 (GAUZE/BANDAGES/DRESSINGS) IMPLANT
BLADE SAW SGTL 18X1.27X75 (BLADE) ×2 IMPLANT
BLADE SAW SGTL 18X1.27X75MM (BLADE) ×1
CAPT HIP TOTAL 2 ×3 IMPLANT
CELLS DAT CNTRL 66122 CELL SVR (MISCELLANEOUS) ×1 IMPLANT
CLOSURE WOUND 1/2 X4 (GAUZE/BANDAGES/DRESSINGS)
COVER PERINEAL POST (MISCELLANEOUS) ×3 IMPLANT
COVER SURGICAL LIGHT HANDLE (MISCELLANEOUS) ×3 IMPLANT
DRAPE STERI IOBAN 125X83 (DRAPES) ×3 IMPLANT
DRAPE U-SHAPE 47X51 STRL (DRAPES) ×6 IMPLANT
DRESSING AQUACEL AG SP 3.5X10 (GAUZE/BANDAGES/DRESSINGS) ×1 IMPLANT
DRSG AQUACEL AG ADV 3.5X10 (GAUZE/BANDAGES/DRESSINGS) ×3 IMPLANT
DRSG AQUACEL AG SP 3.5X10 (GAUZE/BANDAGES/DRESSINGS) ×3
DURAPREP 26ML APPLICATOR (WOUND CARE) ×3 IMPLANT
ELECT REM PT RETURN 15FT ADLT (MISCELLANEOUS) ×3 IMPLANT
GAUZE XEROFORM 1X8 LF (GAUZE/BANDAGES/DRESSINGS) ×3 IMPLANT
GLOVE BIO SURGEON STRL SZ7.5 (GLOVE) ×3 IMPLANT
GLOVE BIOGEL PI IND STRL 7.5 (GLOVE) ×4 IMPLANT
GLOVE BIOGEL PI IND STRL 8 (GLOVE) ×2 IMPLANT
GLOVE BIOGEL PI INDICATOR 7.5 (GLOVE) ×8
GLOVE BIOGEL PI INDICATOR 8 (GLOVE) ×4
GLOVE ECLIPSE 8.0 STRL XLNG CF (GLOVE) ×3 IMPLANT
GLOVE SURG SS PI 7.0 STRL IVOR (GLOVE) ×3 IMPLANT
GLOVE SURG SS PI 7.5 STRL IVOR (GLOVE) ×3 IMPLANT
GOWN STRL REUS W/ TWL XL LVL3 (GOWN DISPOSABLE) ×1 IMPLANT
GOWN STRL REUS W/TWL XL LVL3 (GOWN DISPOSABLE) ×11 IMPLANT
HANDPIECE INTERPULSE COAX TIP (DISPOSABLE) ×2
HOLDER FOLEY CATH W/STRAP (MISCELLANEOUS) ×3 IMPLANT
PACK ANTERIOR HIP CUSTOM (KITS) ×3 IMPLANT
RTRCTR WOUND ALEXIS 18CM MED (MISCELLANEOUS) ×3
SET HNDPC FAN SPRY TIP SCT (DISPOSABLE) ×1 IMPLANT
STAPLER VISISTAT 35W (STAPLE) IMPLANT
STRIP CLOSURE SKIN 1/2X4 (GAUZE/BANDAGES/DRESSINGS) IMPLANT
SUT ETHIBOND NAB CT1 #1 30IN (SUTURE) ×3 IMPLANT
SUT MNCRL AB 4-0 PS2 18 (SUTURE) IMPLANT
SUT VIC AB 0 CT1 36 (SUTURE) ×3 IMPLANT
SUT VIC AB 1 CT1 36 (SUTURE) ×3 IMPLANT
SUT VIC AB 2-0 CT1 27 (SUTURE) ×4
SUT VIC AB 2-0 CT1 TAPERPNT 27 (SUTURE) ×2 IMPLANT
TRAY FOLEY W/METER SILVER 16FR (SET/KITS/TRAYS/PACK) ×3 IMPLANT
YANKAUER SUCT BULB TIP 10FT TU (MISCELLANEOUS) ×3 IMPLANT

## 2016-11-29 NOTE — Transfer of Care (Signed)
Immediate Anesthesia Transfer of Care Note  Patient: Allen Espinoza  Procedure(s) Performed: Procedure(s): LEFT TOTAL HIP ARTHROPLASTY ANTERIOR APPROACH (Left)  Patient Location: PACU  Anesthesia Type:MAC and Spinal  Level of Consciousness: awake, alert , oriented and patient cooperative  Airway & Oxygen Therapy: Patient Spontanous Breathing and Patient connected to face mask oxygen  Post-op Assessment: Report given to RN and Post -op Vital signs reviewed and stable  Post vital signs: Reviewed and stable  Last Vitals:  Vitals:   11/29/16 0617  BP: (!) 148/73  Pulse: (!) 58  Resp: 18  Temp: 36.7 C  SpO2: 98%    Last Pain:  Vitals:   11/29/16 0645  TempSrc:   PainSc: 0-No pain      Patients Stated Pain Goal: 4 (30/05/11 0211)  Complications: No apparent anesthesia complications

## 2016-11-29 NOTE — Anesthesia Postprocedure Evaluation (Signed)
Anesthesia Post Note  Patient: Allen Espinoza  Procedure(s) Performed: Procedure(s) (LRB): LEFT TOTAL HIP ARTHROPLASTY ANTERIOR APPROACH (Left)     Patient location during evaluation: PACU Anesthesia Type: Spinal Level of consciousness: oriented and awake and alert Pain management: pain level controlled Vital Signs Assessment: post-procedure vital signs reviewed and stable Respiratory status: spontaneous breathing, respiratory function stable and patient connected to nasal cannula oxygen Cardiovascular status: blood pressure returned to baseline and stable Postop Assessment: no headache and no backache Anesthetic complications: no    Last Vitals:  Vitals:   11/29/16 1358 11/29/16 1451  BP: 130/72 124/75  Pulse:  60  Resp: 16 16  Temp: (!) 36.4 C 36.5 C  SpO2: 100% 100%    Last Pain:  Vitals:   11/29/16 1451  TempSrc: Oral  PainSc:                  Tinaya Ceballos S

## 2016-11-29 NOTE — Evaluation (Signed)
Physical Therapy Evaluation Patient Details Name: Allen Espinoza MRN: [id number] DOB: 1961-01-31 Today's Date: 11/29/2016   History of Present Illness  Pt s/p L THR and with hx of HIV and CKD  Clinical Impression  Pt s/p L THR and presents with decreased L LE strength/ROM and post op pain limiting functional mobility.  Pt should progress to dc home with family assist.    Follow Up Recommendations Home health PT    Equipment Recommendations  None recommended by PT    Recommendations for Other Services OT consult     Precautions / Restrictions Precautions Precautions: Fall Restrictions Weight Bearing Restrictions: No Other Position/Activity Restrictions: WBAT      Mobility  Bed Mobility Overal bed mobility: Needs Assistance Bed Mobility: Supine to Sit     Supine to sit: Min assist     General bed mobility comments: cues for sequence and use of R LE to self assist  Transfers Overall transfer level: Needs assistance Equipment used: Rolling walker (2 wheeled) Transfers: Sit to/from Stand Sit to Stand: Min assist         General transfer comment: cues for LE management and use of UEs to self assist  Ambulation/Gait Ambulation/Gait assistance: Min assist Ambulation Distance (Feet): 50 Feet Assistive device: Rolling walker (2 wheeled) Gait Pattern/deviations: Step-to pattern;Step-through pattern;Decreased step length - right;Decreased step length - left;Shuffle;Trunk flexed Gait velocity: decr Gait velocity interpretation: Below normal speed for age/gender General Gait Details: cues for sequence, posture and position from [employer]            Wheelchair Mobility    Modified Rankin (Stroke Patients Only)       Balance                                             Pertinent Vitals/Pain Pain Assessment: 0-10 Pain Score: 3  Pain Location: L hip Pain Descriptors / Indicators: Sore;Tightness Pain Intervention(s): Limited activity  within patient's tolerance;Monitored during session;Premedicated before session;Ice applied    Home Living Family/patient expects to be discharged to:: Private residence Living Arrangements: Spouse/significant other Available Help at Discharge: Family Type of Home: Apartment Home Access: Stairs to enter Entrance Stairs-Rails: Right;Left;Can reach both Technical brewer of Steps: [place]: One level Home Equipment: Environmental consultant - 2 wheels      Prior Function Level of Independence: Independent               Hand Dominance        Extremity/Trunk Assessment   Upper Extremity Assessment Upper Extremity Assessment: Overall WFL for tasks assessed    Lower Extremity Assessment Lower Extremity Assessment: LLE deficits/detail    Cervical / Trunk Assessment Cervical / Trunk Assessment: Normal  Communication   Communication: No difficulties  Cognition Arousal/Alertness: Awake/alert Behavior During Therapy: WFL for tasks assessed/performed Overall Cognitive Status: Within Functional Limits for tasks assessed                                        General Comments      Exercises Total Joint Exercises Ankle Circles/Pumps: AROM;Both;20 reps;Supine   Assessment/Plan    PT Assessment Patient needs continued PT services  PT Problem List Decreased strength;Decreased range of motion;Decreased activity tolerance;Decreased mobility;Decreased knowledge of use of DME;Pain;Obesity  PT Treatment Interventions DME instruction;Gait training;Stair training;Functional mobility training;Therapeutic activities;Therapeutic exercise;Patient/family education    PT Goals (Current goals can be found in the Care Plan section)  Acute Rehab PT Goals Patient Stated Goal: Regain IND and walk without pain PT Goal Formulation: With patient Time For Goal Achievement: 12/03/16 Potential to Achieve Goals: Good    Frequency 7X/week   Barriers to discharge         Co-evaluation               AM-PAC PT "6 Clicks" Daily Activity  Outcome Measure Difficulty turning over in bed (including adjusting bedclothes, sheets and blankets)?: Unable Difficulty moving from lying on back to sitting on the side of the bed? : Unable Difficulty sitting down on and standing up from a chair with arms (e.g., wheelchair, bedside commode, etc,.)?: Unable Help needed moving to and from a bed to chair (including a wheelchair)?: A Little Help needed walking in hospital room?: A Little Help needed climbing 3-5 steps with a railing? : A Little 6 Click Score: 12    End of Session Equipment Utilized During Treatment: Gait belt Activity Tolerance: Patient tolerated treatment well Patient left: in chair;with call bell/phone within reach;with family/visitor present Nurse Communication: Mobility status PT Visit Diagnosis: Difficulty in walking, not elsewhere classified (R26.2)    Time: 9038-3338 PT Time Calculation (min) (ACUTE ONLY): 33 min   Charges:   PT Evaluation $PT Eval Low Complexity: 1 Low PT Treatments $Gait Training: 8-22 mins   PT G Codes:        Pg [phone]   Danayah Smyre 11/29/2016, 6:20 PM

## 2016-11-29 NOTE — Op Note (Signed)
Allen Espinoza, Allen Espinoza                 ACCOUNT NO.:  [id number]  MEDICAL RECORD NO.:  [id number]  LOCATION:                                 FACILITY:  PHYSICIAN:  Lind Guest. Ninfa Linden, M.D.DATE OF BIRTH:  07/16/1960  DATE OF PROCEDURE:  11/29/2016 DATE OF DISCHARGE:                              OPERATIVE REPORT   PREOPERATIVE DIAGNOSIS:  Osteonecrosis with osteoarthritis, left hip.  POSTOPERATIVE DIAGNOSIS:  Osteonecrosis with osteoarthritis, left hip.  PROCEDURE:  Left total hip arthroplasty through direct anterior approach.  IMPLANTS:  DePuy Sector Gription acetabular component size 56, size 36 +0 neutral polyethylene liner, size 13 Corail femoral component with standard offset, size 36 +1.5 ceramic hip ball.  SURGEON:  Lind Guest. Ninfa Linden, M.D.  ASSISTANT:  Erskine Emery, PA-C.  ANESTHESIA:  Spinal.  BLOOD LOSS:  400 mL.  ANTIBIOTICS:  3 g of IV Ancef.  COMPLICATIONS:  None.  INDICATIONS:  Allen Espinoza is a 56 year old gentleman who is morbidly obese as well as HIV positive, but he has an undetectable viral load and normal CD4 count.  Unfortunately, he does have severe osteonecrosis with osteoarthritis superimposed on his left hip.  His pain is daily and has detrimentally affected his activities of daily living, his quality of life, and his mobility.  His x-ray showed complete loss of the joint space in the left hip with normal appearing right hip.  At this point, his pain is 10/10.  He does wish to proceed with a total hip arthroplasty.  I have talked to him about the highest risk of bleeding infection given his immunocompromised status and his obesity.  He understands the risk of acute blood loss anemia, nerve and vessel injury, infection, DVT, and dislocation given his weight.  He knows these are heightened.  He understands our goals are to decrease pain, improve mobility, and overall improved quality of life.  PROCEDURE DESCRIPTION:  After informed consent was  obtained, appropriate left hip was marked.  He was brought to the operating room where spinal anesthesia was obtained.  He was then laid in a supine position.  A Foley catheter was placed and both feet had traction boots applied to them.  Next, he was placed supine on the Hana fracture table with the perineal post in place and both legs in inline skeletal traction devices, but no traction applied.  His left operative hip was prepped and draped with DuraPrep and sterile drapes.  Time-out was called, he was identified as correct patient and correct left hip.  I then made an incision just inferior and posterior to the anterior superior iliac spine and carried this obliquely down the leg.  We dissected down the tensor fascia lata muscle.  The tensor fascia was then divided longitudinally to proceed with a direct anterior approach to the hip. We identified and cauterized the circumflex vessels then identified the hip capsule.  I opened up the hip capsule in an L-type format, finding a large joint effusion and significant arthritis in his hip.  We placed Cobra retractors around the medial and lateral femoral neck and then made our femoral neck cut just proximal to lesser trochanter using an oscillating saw and completed  this with an osteotome.  I placed a corkscrew guide in the femoral head.  Removed the femoral head in its entirety, found to be devoid of cartilage and also areas of soft bone consistent with both osteoarthritis and osteonecrosis.  We then cleaned the acetabular remnants of the acetabular labrum and other debris.  I placed a bent Hohmann over the medial acetabular rim and then began reaming under direct visualization from a size 42 reamer in stepwise increments all the way up to a size 56 with all reamers under direct visualization the last 2 reamers under direct fluoroscopy, so I could obtain my depth of reaming, my inclination and anteversion.  Once I was pleased with this, I  placed the real DePuy Sector Gription acetabular component size 56 and a 36 +0 polyethylene liner for that size acetabular component.  Attention was then turned to the femur.  With the leg externally rotated to 120 degrees extended and adducted, we replaced a Mueller retractor medially and a Hohmann retractor behind the greater trochanter.  We released the lateral joint capsule and used a box cutting osteotome to enter from the femoral canal and a rongeur to lateralize.  We then began broaching from a size 8 broach using the Corail broaching system going up to a size 13.  With 13 in place, we trialed a standard offset femoral neck and a 36 +1.5 hip ball.  We brought the leg back over and up with traction and internal rotation, reducing the pelvis.  We were pleased with leg length, offset, stability, and range of motion.  We then dislocated the hip and removed the trial components.  We were able to place the real Corail femoral component with standard offset size 13 and a real 36 +1.5 ceramic hip ball and again reduced this in the acetabulum.  Once we were pleased with the positioning of the implant, we irrigated the soft tissue with normal saline solution using pulsatile lavage.  We closed the joint capsule with interrupted #1 Ethibond suture followed by running #1 Vicryl in the tensor fascia, 0 Vicryl in the deep tissue, 2-0 Vicryl in the subcutaneous tissue, interrupted staples on the skin.  Xeroform and Aquacel dressing were applied.  He was taken to the recovery room in stable condition.  All final counts were correct.  There were no complications noted.  Of note, Erskine Emery, PA-C, assisted in the entire case.  His assistance was crucial for facilitating all aspects of this case.     Lind Guest. Ninfa Linden, M.D.   ______________________________ Lind Guest. Ninfa Linden, M.D.    CYB/MEDQ  D:  11/29/2016  T:  11/29/2016  Job:  [id number]

## 2016-11-29 NOTE — Anesthesia Preprocedure Evaluation (Signed)
Anesthesia Evaluation  Patient identified by MRN, date of birth, ID band Patient awake    Reviewed: Allergy & Precautions, NPO status , Patient's Chart, lab work & pertinent test results  Airway Mallampati: II  TM Distance: <3 FB Neck ROM: Full    Dental no notable dental hx.    Pulmonary neg pulmonary ROS,    breath sounds clear to auscultation + decreased breath sounds      Cardiovascular + DVT  Normal cardiovascular exam Rhythm:Regular Rate:Normal     Neuro/Psych negative neurological ROS  negative psych ROS   GI/Hepatic Neg liver ROS, GERD  ,  Endo/Other  Morbid obesity  Renal/GU negative Renal ROS  negative genitourinary   Musculoskeletal negative musculoskeletal ROS (+)   Abdominal   Peds negative pediatric ROS (+)  Hematology  (+) HIV,   Anesthesia Other Findings   Reproductive/Obstetrics negative OB ROS                             Anesthesia Physical Anesthesia Plan  ASA: III  Anesthesia Plan: Spinal   Post-op Pain Management:    Induction: Intravenous  PONV Risk Score and Plan: 1 and Ondansetron and Dexamethasone  Airway Management Planned: Simple Face Mask  Additional Equipment:   Intra-op Plan:   Post-operative Plan:   Informed Consent: I have reviewed the patients History and Physical, chart, labs and discussed the procedure including the risks, benefits and alternatives for the proposed anesthesia with the patient or authorized representative who has indicated his/her understanding and acceptance.   Dental advisory given  Plan Discussed with: CRNA and Surgeon  Anesthesia Plan Comments:         Anesthesia Quick Evaluation

## 2016-11-29 NOTE — H&P (Signed)
TOTAL HIP ADMISSION H&P  Patient is admitted for left total hip arthroplasty.  Subjective:  Chief Complaint: left hip pain  HPI: Allen Espinoza, 56 y.o. male, has a history of pain and functional disability in the left hip(s) due to arthritis and AVN and patient has failed non-surgical conservative treatments for greater than 12 weeks to include NSAID's and/or analgesics, corticosteriod injections, flexibility and strengthening excercises, use of assistive devices, weight reduction as appropriate and activity modification.  Onset of symptoms was abrupt starting 2 years ago with rapidlly worsening course since that time.The patient noted no past surgery on the left hip(s).  Patient currently rates pain in the left hip at 10 out of 10 with activity. Patient has night pain, worsening of pain with activity and weight bearing, trendelenberg gait, pain that interfers with activities of daily living and pain with passive range of motion. Patient has evidence of subchondral cysts, subchondral sclerosis, periarticular osteophytes and joint space narrowing by imaging studies. This condition presents safety issues increasing the risk of falls.  There is no current active infection.  Patient Active Problem List   Diagnosis Date Noted  . Unilateral primary osteoarthritis, left hip 09/16/2016  . Edema 08/20/2016  . 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 ([place]) 08/19/2016  . Morbid (severe) obesity due to excess calories ([place]) 08/19/2016  . Pain in left hip 08/19/2016  . Solitary pulmonary nodule 05/06/2016  . Hidradenitis suppurativa 04/27/2016  . Chest pain 04/26/2016  . Avascular necrosis of bone of hip, left ([place]) 03/19/2016  . Human immunodeficiency virus (HIV) disease ([place]) 03/18/2016  . DVT (deep venous thrombosis) ([place]) 03/18/2016  . Axillary abscess 03/18/2016  . Chronic kidney disease 03/18/2016  . Encounter for immunization 03/18/2016  . Cholelithiasis  03/18/2016  . Diverticulosis of colon without hemorrhage 03/18/2016   Past Medical History:  Diagnosis Date  . Abscess   . Arthritis    all over  . Avascular necrosis of femoral head, left ([place])   . Cholelithiasis   . CKD (chronic kidney disease) stage 3, GFR 30-59 ml/min    sees kidney Dr.  . DVT (deep venous thrombosis) ([facility])    legs  . Dyspnea    when walking  . Dysrhythmia    remembers mother taking about having an irregular rhythm years when he was a child   . GERD (gastroesophageal reflux disease)    takes Pepcid  . HIV (human immunodeficiency virus infection) ([place])   . Morbid obesity ([place])     Past Surgical History:  Procedure Laterality Date  . COLONOSCOPY    . DENTAL SURGERY     had teeth pulled  . HYDRADENITIS EXCISION Left 10/14/2016   Procedure: WIDE EXCISION HIDRADENITIS LEFT AXILLA;  Surgeon: Coralie Keens, MD;  Location: [facility];  Service: General;  Laterality: Left;  . JOINT REPLACEMENT     Left hip Dr. Ninfa Linden 11/29/16    Prescriptions Prior to Admission  Medication Sig Dispense Refill Last Dose  . darunavir (PREZISTA) 800 MG tablet TAKE 1 TABLET BY MOUTH ONCE DAILY WITH BREAKFAST 30 tablet 3 11/28/2016 at Unknown time  . elvitegravir-cobicistat-emtricitabine-tenofovir (GENVOYA) 150-150-200-10 MG TABS tablet TAKE ONE TABLET BY MOUTH ONCE DAILY WITH BREAKFAST 30 tablet 3 11/28/2016 at Unknown time  . minocycline (MINOCIN,DYNACIN) 50 MG capsule Take 50 mg by mouth 2 (two) times daily.   11/28/2016 at Unknown time  . rivaroxaban (XARELTO) 20 MG TABS tablet Take 1 tablet (20 mg total) by mouth every  morning. 90 tablet 0 11/24/2016  . oxyCODONE (OXY IR/ROXICODONE) 5 MG immediate release tablet Take 1-2 tablets (5-10 mg total) by mouth every 6 (six) hours as needed for moderate pain, severe pain or breakthrough pain. (Patient not taking: Reported on 11/19/2016) 30 tablet 0 Not Taking   Allergies  Allergen Reactions  . Sulfa Antibiotics Other (See Comments)    Low  blood pressure  . Vancomycin Itching    Give with benadryl    Social History  Substance Use Topics  . Smoking status: Never Smoker  . Smokeless tobacco: Never Used  . Alcohol use No     Comment: prior    Family History  Problem Relation Age of Onset  . Heart attack Mother   . Heart attack Father      Review of Systems  Musculoskeletal: Positive for joint pain.  All other systems reviewed and are negative.   Objective:  Physical Exam  Constitutional: He is oriented to person, place, and time. He appears well-developed and well-nourished.  HENT:  Head: Normocephalic and atraumatic.  Eyes: Pupils are equal, round, and reactive to light. EOM are normal.  Neck: Normal range of motion. Neck supple.  Cardiovascular: Normal rate and regular rhythm.   Respiratory: Effort normal and breath sounds normal.  GI: Soft. Bowel sounds are normal.  Musculoskeletal:       Left hip: He exhibits decreased range of motion, decreased strength, tenderness and bony tenderness.  Neurological: He is alert and oriented to person, place, and time.  Skin: Skin is warm and dry.  Psychiatric: He has a normal mood and affect.    Vital signs in last 24 hours: Temp:  [98.1 F (36.7 C)] 98.1 F (36.7 C) (08/31 0617) Pulse Rate:  [58] 58 (08/31 0617) Resp:  [18] 18 (08/31 0617) BP: (148)/(73) 148/73 (08/31 0617) SpO2:  [98 %] 98 % (08/31 0617) Weight:  [295 lb (133.8 kg)] 295 lb (133.8 kg) (08/31 0645)  Labs:   Estimated body mass index is 40.01 kg/m as calculated from the following:   Height as of this encounter: 6' (1.829 m).   Weight as of this encounter: 295 lb (133.8 kg).   Imaging Review Plain radiographs demonstrate severe degenerative joint disease of the left hip(s). The bone quality appears to be good for age and reported activity level.  Assessment/Plan:  End stage arthritis vs AVN, left hip(s)  The patient history, physical examination, clinical judgement of the provider and  imaging studies are consistent with end stage degenerative joint disease of the left hip(s) and total hip arthroplasty is deemed medically necessary. The treatment options including medical management, injection therapy, arthroscopy and arthroplasty were discussed at length. The risks and benefits of total hip arthroplasty were presented and reviewed. The risks due to aseptic loosening, infection, stiffness, dislocation/subluxation,  thromboembolic complications and other imponderables were discussed.  The patient acknowledged the explanation, agreed to proceed with the plan and consent was signed. Patient is being admitted for inpatient treatment for surgery, pain control, PT, OT, prophylactic antibiotics, VTE prophylaxis, progressive ambulation and ADL's and discharge planning.The patient is planning to be discharged home with home health services

## 2016-11-29 NOTE — Anesthesia Procedure Notes (Addendum)
Spinal  Patient location during procedure: OR Start time: 11/29/2016 9:40 AM End time: 11/29/2016 9:50 AM Staffing Anesthesiologist: ROSE, Iona Beard Performed: anesthesiologist  Preanesthetic Checklist Completed: patient identified, site marked, surgical consent, pre-op evaluation, timeout performed, IV checked, risks and benefits discussed and monitors and equipment checked Spinal Block Patient position: sitting Prep: Betadine Patient monitoring: heart rate, continuous pulse ox and blood pressure Approach: midline Location: L3-4 Injection technique: single-shot Needle Needle type: Pencan  Needle gauge: 24 G Needle length: 9 cm Needle insertion depth: 7 cm Assessment Sensory level: T6 Additional Notes Expiration date of kit checked and confirmed. Patient tolerated procedure well, without complications.

## 2016-11-29 NOTE — Brief Op Note (Signed)
11/29/2016  11:22 AM  PATIENT:  Allen Espinoza  56 y.o. male  PRE-OPERATIVE DIAGNOSIS:  avascular necrosis left hip  POST-OPERATIVE DIAGNOSIS:  avascular necrosis left hip  PROCEDURE:  Procedure(s): LEFT TOTAL HIP ARTHROPLASTY ANTERIOR APPROACH (Left)  SURGEON:  Surgeon(s) and Role:    [name], MD - Primary  PHYSICIAN ASSISTANT: Benita Stabile, PA-C  ANESTHESIA:   spinal  EBL:  Total I/O In: 1400 [I.V.:1400] Out: 500 [Urine:100; Blood:400]  COUNTS:  YES  DICTATION: .Other Dictation: Dictation Number [id number]  PLAN OF CARE: Admit to inpatient   PATIENT DISPOSITION:  PACU - hemodynamically stable.   Delay start of Pharmacological VTE agent (>24hrs) due to surgical blood loss or risk of bleeding: no

## 2016-11-29 NOTE — Op Note (Deleted)
  The note originally documented on this encounter has been moved the the encounter in which it belongs.  

## 2016-11-30 LAB — BASIC METABOLIC PANEL
ANION GAP: 6 (ref 5–15)
BUN: 13 mg/dL (ref 6–20)
CALCIUM: 8.8 mg/dL — AB (ref 8.9–10.3)
CO2: 24 mmol/L (ref 22–32)
Chloride: 108 mmol/L (ref 101–111)
Creatinine, Ser: 1.29 mg/dL — ABNORMAL HIGH (ref 0.61–1.24)
GFR calc Af Amer: >60 mL/min (ref >60)
GFR, EST NON AFRICAN AMERICAN: 60 mL/min — AB (ref >60)
GLUCOSE: 168 mg/dL — AB (ref 65–99)
POTASSIUM: 5.2 mmol/L — AB (ref 3.5–5.1)
SODIUM: 138 mmol/L (ref 135–145)

## 2016-11-30 LAB — CBC
HCT: 28.6 % — ABNORMAL LOW (ref 39.0–52.0)
HEMOGLOBIN: 9 g/dL — AB (ref 13.0–17.0)
MCH: 29.4 pg (ref 26.0–34.0)
MCHC: 31.5 g/dL (ref 30.0–36.0)
MCV: 93.5 fL (ref 78.0–100.0)
Platelets: 323 10*3/uL (ref 150–400)
RBC: 3.06 MIL/uL — ABNORMAL LOW (ref 4.22–5.81)
RDW: 15.5 % (ref 11.5–15.5)
WBC: 15.7 10*3/uL — AB (ref 4.0–10.5)

## 2016-11-30 MED ORDER — SODIUM POLYSTYRENE SULFONATE 15 GM/60ML PO SUSP
30.0000 g | Freq: Once | ORAL | Status: AC
  Administered 2016-11-30: 13:00:00 30 g via ORAL
  Filled 2016-11-30: qty 120

## 2016-11-30 MED ORDER — TAMSULOSIN HCL 0.4 MG PO CAPS
0.4000 mg | ORAL_CAPSULE | Freq: Every day | ORAL | Status: DC
  Administered 2016-11-30 – 2016-12-03 (×4): 0.4 mg via ORAL
  Filled 2016-11-30 (×4): qty 1

## 2016-11-30 MED ORDER — OXYCODONE HCL 5 MG PO TABS: 5.0000 mg | ORAL_TABLET | ORAL | 0 refills | Status: DC | PRN

## 2016-11-30 MED ORDER — TIZANIDINE HCL 4 MG PO TABS: 4.0000 mg | ORAL_TABLET | Freq: Three times a day (TID) | ORAL | 0 refills | Status: DC | PRN

## 2016-11-30 MED ORDER — MINOCYCLINE HCL 50 MG PO CAPS: 50.0000 mg | ORAL_CAPSULE | Freq: Two times a day (BID) | ORAL | 0 refills | Status: DC

## 2016-11-30 MED ORDER — FUROSEMIDE 10 MG/ML IJ SOLN
20.0000 mg | Freq: Once | INTRAMUSCULAR | Status: AC
  Administered 2016-11-30: 16:00:00 20 mg via INTRAVENOUS
  Filled 2016-11-30: qty 2

## 2016-11-30 NOTE — Evaluation (Addendum)
Occupational Therapy Evaluation Patient Details Name: Allen Espinoza MRN: [id number] DOB: 12-Jun-1960 Today's Date: 11/30/2016    History of Present Illness Pt s/p L THR and with hx of HIV and CKD   Clinical Impression   Pt doing well and practiced up to 3in1 with walker this am. Pt introduced to AE for LB ADL also. He is very motivated. Will benefit from continued OT to progress ADL independence for return home.    Follow Up Recommendations  No OT follow up;Supervision/Assistance - 24 hour    Equipment Recommendations  3 in 1 bedside commode (pt also requesting wide RW)   Recommendations for Other Services       Precautions / Restrictions Precautions Precautions: Fall Restrictions Weight Bearing Restrictions: No Other Position/Activity Restrictions: WBAT      Mobility Bed Mobility Overal bed mobility: Needs Assistance Bed Mobility: Supine to Sit     Supine to sit: Min guard;HOB elevated     General bed mobility comments: min cues  Transfers Overall transfer level: Needs assistance Equipment used: Rolling walker (2 wheeled) Transfers: Sit to/from Stand Sit to Stand: Min guard         General transfer comment: cues for hand placement and LE management.    Balance                                           ADL either performed or assessed with clinical judgement   ADL Overall ADL's : Needs assistance/impaired Eating/Feeding: Independent;Sitting   Grooming: Wash/dry hands;Set up;Sitting   Upper Body Bathing: Set up;Sitting   Lower Body Bathing: Moderate assistance;Sit to/from stand   Upper Body Dressing : Set up;Sitting   Lower Body Dressing: Moderate assistance;Sit to/from stand   Toilet Transfer: Min guard;Ambulation;RW;BSC   Toileting- Clothing Manipulation and Hygiene: Sit to/from stand;Min guard         General ADL Comments: Discussed 3in1 for over the commode at home as pt has a low commode and needs the armrests for  support. Pt has a tubseat for his tub at home. pt practiced with sock aid to don bilateral socks with min assist. He is interested in obtaining an AE kit for LB dressing. Educated on all pieces in [place].      Vision Baseline Vision/History: No visual deficits       Perception     Praxis      Pertinent Vitals/Pain Pain Assessment: 0-10 Pain Score: 5  Pain Location: L hip Pain Descriptors / Indicators: Sore;Tightness Pain Intervention(s): Monitored during session;Ice applied     Hand Dominance     Extremity/Trunk Assessment Upper Extremity Assessment Upper Extremity Assessment: Overall WFL for tasks assessed; recent L axilla surgery with bandage noted in this area.           Communication Communication Communication: No difficulties   Cognition Arousal/Alertness: Awake/alert Behavior During Therapy: WFL for tasks assessed/performed Overall Cognitive Status: Within Functional Limits for tasks assessed                                     General Comments       Exercises     Shoulder Instructions      Home Living Family/patient expects to be discharged to:: Private residence Living Arrangements: Spouse/significant other Available Help at Discharge: Family Type of  Home: Apartment Home Access: Stairs to enter [employer] of Steps: 18 Entrance Stairs-Rails: Right;Left;Can reach both Home Layout: One level     Bathroom Shower/Tub: Teacher, early years/pre: Standard     Home Equipment: Environmental consultant - 2 wheels;Shower seat          Prior Functioning/Environment Level of Independence: Independent                 OT Problem List: Decreased strength;Decreased knowledge of use of DME or AE      OT Treatment/Interventions: Self-care/ADL training;Patient/family education;DME and/or AE instruction;Therapeutic activities    OT Goals(Current goals can be found in the care plan section) Acute Rehab OT Goals Patient Stated Goal:  return to independence OT Goal Formulation: With patient Time For Goal Achievement: 12/07/16 Potential to Achieve Goals: Good  OT Frequency: Min 2X/week   Barriers to D/C:            Co-evaluation              AM-PAC PT "6 Clicks" Daily Activity     Outcome Measure Help from another person eating meals?: None Help from another person taking care of personal grooming?: None Help from another person toileting, which includes using toliet, bedpan, or urinal?: A Little Help from another person bathing (including washing, rinsing, drying)?: A Lot Help from another person to put on and taking off regular upper body clothing?: A Little Help from another person to put on and taking off regular lower body clothing?: A Lot 6 Click Score: 18   End of Session Equipment Utilized During Treatment: Rolling walker  Activity Tolerance: Patient tolerated treatment well Patient left: in chair;with call bell/phone within reach  OT Visit Diagnosis: Muscle weakness (generalized) (M62.81);Pain Pain - Right/Left: Left Pain - part of body: Hip                Time: 0928-1000 OT Time Calculation (min): 32 min Charges:  OT General Charges $OT Visit: 1 Visit OT Evaluation $OT Eval Low Complexity: 1 Low OT Treatments $Therapeutic Activity: 8-22 mins G-Codes:       Jae Dire Miakoda Mcmillion 11/30/2016, 10:51 AM

## 2016-11-30 NOTE — Progress Notes (Signed)
Physical Therapy Treatment Patient Details Name: Allen Espinoza MRN: [id number] DOB: 14-Apr-1960 Today's Date: 11/30/2016    History of Present Illness Pt s/p L THR and with hx of HIV and CKD    PT Comments    POD # 1 pm session Assisted with amb a greater distance.  Increased pain this afternoon, requested pain meds.  ICE applied.  Pt has 20 stairs steps to second floor apt.     Follow Up Recommendations  Home health PT     Equipment Recommendations  Rolling walker with 5" wheels;3in1 (PT)    Recommendations for Other Services       Precautions / Restrictions Precautions Precautions: Fall Restrictions Weight Bearing Restrictions: No Other Position/Activity Restrictions: WBAT    Mobility  Bed Mobility               General bed mobility comments: OOB in recliner  Transfers Overall transfer level: Needs assistance Equipment used: Rolling walker (2 wheeled) Transfers: Sit to/from Stand Sit to Stand: Min guard         General transfer comment: 25% cues for hand placement and LE management.  Ambulation/Gait Ambulation/Gait assistance: Min guard Ambulation Distance (Feet): 85 Feet Assistive device: Rolling walker (2 wheeled) Gait Pattern/deviations: Step-to pattern;Step-through pattern;Decreased step length - right;Decreased step length - left;Shuffle;Trunk flexed Gait velocity: decr   General Gait Details: 25% cues for sequence, posture and position from [employer]            Wheelchair Mobility    Modified Rankin (Stroke Patients Only)       Balance                                            Cognition Arousal/Alertness: Awake/alert Behavior During Therapy: WFL for tasks assessed/performed Overall Cognitive Status: Within Functional Limits for tasks assessed                                        Exercises      General Comments        Pertinent Vitals/Pain Pain Assessment: 0-10 Pain Score: 7   Pain Location: L hip Pain Descriptors / Indicators: Sore;Tightness;Operative site guarding Pain Intervention(s): Monitored during session;Repositioned;Ice applied;Patient requesting pain meds-RN notified    Home Living                      Prior Function            PT Goals (current goals can now be found in the care plan section) Progress towards PT goals: Progressing toward goals    Frequency    7X/week      PT Plan Current plan remains appropriate    Co-evaluation              AM-PAC PT "6 Clicks" Daily Activity  Outcome Measure  Difficulty turning over in bed (including adjusting bedclothes, sheets and blankets)?: A Little Difficulty moving from lying on back to sitting on the side of the bed? : A Little Difficulty sitting down on and standing up from a chair with arms (e.g., wheelchair, bedside commode, etc,.)?: A Little Help needed moving to and from a bed to chair (including a wheelchair)?: A Little Help needed walking in hospital room?: A Little Help needed  climbing 3-5 steps with a railing? : A Lot 6 Click Score: 17    End of Session Equipment Utilized During Treatment: Gait belt Activity Tolerance: Patient tolerated treatment well Patient left: in chair;with call bell/phone within reach;with family/visitor present Nurse Communication: Mobility status PT Visit Diagnosis: Difficulty in walking, not elsewhere classified (R26.2)     Time: 8676-7209 PT Time Calculation (min) (ACUTE ONLY): 14 min  Charges:  $Gait Training: 8-22 mins $Therapeutic Exercise: 8-22 mins                    G Codes:       Rica Koyanagi  PTA [facility]  Acute  Rehab Pager      [phone]

## 2016-11-30 NOTE — Care Management Note (Addendum)
Case Management Note  Patient Details  Name: Allen Espinoza MRN: [id number] Date of Birth: [birth year]  Subjective/Objective:     S/p L THR               Action/Plan: Discharge Planning: Spoke to pt at bedside. Offered choice for HH/list provided. Pt agreeable to [facility]. States his girlfriend at home to assist with care. Contacted [facility] DME rep for RW and 3n1 for home.   Coverage Information (for Hospital Account [id number])   F/O Payor/Plan Precert #  MEDICAID [place]/MEDICAID [place] ACCESS   Subscriber Subscriber #  [place], [place] [id number]  Address Phone  [address] [address] [place], [place] [address]       Expected Discharge Date:                Expected Discharge Plan:  [place]  In-House Referral:  NA  Discharge planning Services  CM Consult  Post Acute Care Choice:  Home Health Choice offered to:  Patient  DME Arranged:  3-N-1, Walker rolling DME Agency:  [place]:  [place]:  [facility] ([facility])  Status of Service:  Completed, signed off  If discussed at [employer] of Stay Meetings, dates discussed:    Additional Comments:  Erenest Rasher, RN 11/30/2016, 12:02 PM

## 2016-11-30 NOTE — Progress Notes (Signed)
Subjective: 1 Day Post-Op Procedure(s) (LRB): LEFT TOTAL HIP ARTHROPLASTY ANTERIOR APPROACH (Left) Patient reports pain as moderate.  Hgb 9.0 - tolerating acute blood loss anemia well.  Vitals stable.  Potassium barely high at 5.2 - asymptomatic.  Objective: Vital signs in last 24 hours: Temp:  [97.4 F (36.3 C)-98 F (36.7 C)] 97.8 F (36.6 C) (09/01 0547) Pulse Rate:  [49-89] 50 (09/01 0547) Resp:  [14-21] 15 (09/01 0547) BP: (80-136)/(40-79) 131/72 (09/01 0547) SpO2:  [100 %] 100 % (09/01 0547)  Intake/Output from previous day: 08/31 0701 - 09/01 0700 In: 3975 [P.O.:1860; I.V.:2015; IV Piggyback:100] Out: 1025 [Urine:625; Blood:400] Intake/Output this shift: Total I/O In: 240 [P.O.:240] Out: -    Recent Labs  11/30/16 0907  HGB 9.0*    Recent Labs  11/30/16 0907  WBC 15.7*  RBC 3.06*  HCT 28.6*  PLT 323    Recent Labs  11/30/16 0530  NA 138  K 5.2*  CL 108  CO2 24  BUN 13  CREATININE 1.29*  GLUCOSE 168*  CALCIUM 8.8*   No results for input(s): LABPT, INR in the last 72 hours.  Sensation intact distally Intact pulses distally Dorsiflexion/Plantar flexion intact Incision: scant drainage  Assessment/Plan: 1 Day Post-Op Procedure(s) (LRB): LEFT TOTAL HIP ARTHROPLASTY ANTERIOR APPROACH (Left) Up with therapy  Discharge to home Sunday or Monday pending his progress. Will check labs tomorrow. Kayexalate to treat slightly high potassium.   [name] 11/30/2016, 10:13 AM

## 2016-11-30 NOTE — Progress Notes (Signed)
Physical Therapy Treatment Patient Details Name: Allen Espinoza MRN: [id number] DOB: 1961-02-03 Today's Date: 11/30/2016    History of Present Illness Pt s/p L THR and with hx of HIV and CKD    PT Comments    POD # 1 am session Assisted out of recliner to amb in hallway then performed standing TE's followed by ICE.  Max c/o "tightness".   Follow Up Recommendations  Home health PT       Equipment Recommendations  Rolling walker with 5" wheels;3in1 (PT)    Recommendations for Other Services       Precautions / Restrictions Precautions Precautions: Fall Restrictions Weight Bearing Restrictions: No Other Position/Activity Restrictions: WBAT    Mobility  Bed Mobility               General bed mobility comments: OOB in recliner  Transfers Overall transfer level: Needs assistance Equipment used: Rolling walker (2 wheeled) Transfers: Sit to/from Stand Sit to Stand: Min guard         General transfer comment: 25% cues for hand placement and LE management.  Ambulation/Gait Ambulation/Gait assistance: Min guard Ambulation Distance (Feet): 65 Feet Assistive device: Rolling walker (2 wheeled) Gait Pattern/deviations: Step-to pattern;Step-through pattern;Decreased step length - right;Decreased step length - left;Shuffle;Trunk flexed Gait velocity: decr   General Gait Details: 25% cues for sequence, posture and position from [employer]            Wheelchair Mobility    Modified Rankin (Stroke Patients Only)       Balance                                            Cognition Arousal/Alertness: Awake/alert Behavior During Therapy: WFL for tasks assessed/performed Overall Cognitive Status: Within Functional Limits for tasks assessed                                        Exercises  10 reps standing TE's     General Comments        Pertinent Vitals/Pain Pain Assessment: 0-10 Pain Score: 2  Pain Location: L  hip Pain Descriptors / Indicators: Sore;Tightness;Operative site guarding Pain Intervention(s): Monitored during session;Repositioned;Ice applied    Home Living                      Prior Function            PT Goals (current goals can now be found in the care plan section) Progress towards PT goals: Progressing toward goals    Frequency    7X/week      PT Plan Current plan remains appropriate    Co-evaluation              AM-PAC PT "6 Clicks" Daily Activity  Outcome Measure  Difficulty turning over in bed (including adjusting bedclothes, sheets and blankets)?: A Little Difficulty moving from lying on back to sitting on the side of the bed? : A Little Difficulty sitting down on and standing up from a chair with arms (e.g., wheelchair, bedside commode, etc,.)?: A Little Help needed moving to and from a bed to chair (including a wheelchair)?: A Little Help needed walking in hospital room?: A Little Help needed climbing 3-5 steps with a railing? : A  Lot 6 Click Score: 17    End of Session Equipment Utilized During Treatment: Gait belt Activity Tolerance: Patient tolerated treatment well Patient left: in chair;with call bell/phone within reach;with family/visitor present Nurse Communication: Mobility status PT Visit Diagnosis: Difficulty in walking, not elsewhere classified (R26.2)     Time: 1771-1657 PT Time Calculation (min) (ACUTE ONLY): 26 min  Charges:  $Gait Training: 8-22 mins $Therapeutic Exercise: 8-22 mins                    G Codes:       Rica Koyanagi  PTA [facility]  Acute  Rehab Pager      [phone]

## 2016-11-30 NOTE — Discharge Instructions (Signed)

## 2016-12-01 LAB — CBC
HCT: 25.8 % — ABNORMAL LOW (ref 39.0–52.0)
HEMOGLOBIN: 8.2 g/dL — AB (ref 13.0–17.0)
MCH: 29.4 pg (ref 26.0–34.0)
MCHC: 31.8 g/dL (ref 30.0–36.0)
MCV: 92.5 fL (ref 78.0–100.0)
PLATELETS: 264 10*3/uL (ref 150–400)
RBC: 2.79 MIL/uL — AB (ref 4.22–5.81)
RDW: 15.5 % (ref 11.5–15.5)
WBC: 15.2 10*3/uL — AB (ref 4.0–10.5)

## 2016-12-01 LAB — BASIC METABOLIC PANEL
ANION GAP: 7 (ref 5–15)
BUN: 24 mg/dL — ABNORMAL HIGH (ref 6–20)
CALCIUM: 8.8 mg/dL — AB (ref 8.9–10.3)
CHLORIDE: 106 mmol/L (ref 101–111)
CO2: 26 mmol/L (ref 22–32)
CREATININE: 1.83 mg/dL — AB (ref 0.61–1.24)
GFR calc non Af Amer: 39 mL/min — ABNORMAL LOW (ref >60)
GFR, EST AFRICAN AMERICAN: 46 mL/min — AB (ref >60)
Glucose, Bld: 153 mg/dL — ABNORMAL HIGH (ref 65–99)
Potassium: 4.5 mmol/L (ref 3.5–5.1)
SODIUM: 139 mmol/L (ref 135–145)

## 2016-12-01 NOTE — Progress Notes (Signed)
Physical Therapy Treatment Patient Details Name: Allen Espinoza MRN: [id number] DOB: [birth year] Today's Date: 12/01/2016    History of Present Illness Pt s/p L THR and with hx of HIV and CKD    PT Comments    POD # 2 pt progressing well Assisted with amb and practiced 12 steps using L rail and R crutch. Pt at a safe level to D/C to home MD pending.   Follow Up Recommendations  Home health PT     Equipment Recommendations  Rolling walker with 5" wheels;3in1 (PT)  Issued one crutch for stair use only    Recommendations for Other Services       Precautions / Restrictions Precautions Precautions: Fall Restrictions Weight Bearing Restrictions: No Other Position/Activity Restrictions: WBAT    Mobility  Bed Mobility               General bed mobility comments: OOB in recliner  Transfers Overall transfer level: Needs assistance Equipment used: Rolling walker (2 wheeled) Transfers: Sit to/from Stand Sit to Stand: Supervision         General transfer comment: no cues needed this session  Ambulation/Gait Ambulation/Gait assistance: Supervision;Min guard Ambulation Distance (Feet): 110 Feet Assistive device: Rolling walker (2 wheeled) Gait Pattern/deviations: Step-to pattern;Step-through pattern;Decreased step length - right;Decreased step length - left;Shuffle;Trunk flexed Gait velocity: decr   General Gait Details: <25% cues for sequence, posture and position from RW   Stairs Stairs: Yes   Stair Management: One rail Left;Step to pattern;Forwards;With crutches Number of Stairs: 12 General stair comments: using L rail and right crutch 25% VC's on proper sequencing and safety.  Pt performed well.   Wheelchair Mobility    Modified Rankin (Stroke Patients Only)       Balance                                            Cognition Arousal/Alertness: Awake/alert Behavior During Therapy: WFL for tasks assessed/performed Overall Cognitive  Status: Within Functional Limits for tasks assessed                                        Exercises      General Comments        Pertinent Vitals/Pain Pain Assessment: 0-10 Pain Score: 5  Pain Location: L hip Pain Descriptors / Indicators: Sore;Tightness;Operative site guarding Pain Intervention(s): Monitored during session;Repositioned;Ice applied    Home Living                      Prior Function            PT Goals (current goals can now be found in the care plan section) Progress towards PT goals: Progressing toward goals    Frequency    7X/week      PT Plan Current plan remains appropriate    Co-evaluation              AM-PAC PT "6 Clicks" Daily Activity  Outcome Measure  Difficulty turning over in bed (including adjusting bedclothes, sheets and blankets)?: A Little Difficulty moving from lying on back to sitting on the side of the bed? : A Little Difficulty sitting down on and standing up from a chair with arms (e.g., wheelchair, bedside commode, etc,.)?: A Little Help needed  moving to and from a bed to chair (including a wheelchair)?: A Little Help needed walking in hospital room?: A Little Help needed climbing 3-5 steps with a railing? : A Lot 6 Click Score: 17    End of Session Equipment Utilized During Treatment: Gait belt Activity Tolerance: Patient tolerated treatment well Patient left: in chair;with call bell/phone within reach;with family/visitor present Nurse Communication:  (pt ready for D/C to home pending MD) PT Visit Diagnosis: Difficulty in walking, not elsewhere classified (R26.2)     Time: 4975-3005 PT Time Calculation (min) (ACUTE ONLY): 29 min  Charges:  $Gait Training: 8-22 mins $Therapeutic Activity: 8-22 mins                    G Codes:       Rica Koyanagi  PTA [facility]  Acute  Rehab Pager      [phone]

## 2016-12-01 NOTE — Progress Notes (Signed)
Patient ID: Allen Espinoza, male   DOB: 05-04-1960, 56 y.o.   MRN: [id number] Patient is status post total hip arthroplasty. He is comfortable this morning sitting in a chair the IV is discontinued. Anticipate discharge to home on Monday. Patient is concerned that he may not be able to get on Medicaid transport van.

## 2016-12-01 NOTE — Progress Notes (Signed)
Occupational Therapy Treatment Patient Details Name: Allen Espinoza MRN: [id number] DOB: 11/06/1960 Today's Date: 12/01/2016    History of present illness Pt s/p L THR and with hx of HIV and CKD   OT comments  All education was completed this session.  No further OT needs at this time  Follow Up Recommendations  No OT follow up;Supervision/Assistance - 24 hour    Equipment Recommendations  3 in 1 bedside commode    Recommendations for Other Services      Precautions / Restrictions Precautions Precautions: Fall Restrictions Weight Bearing Restrictions: No Other Position/Activity Restrictions: WBAT       Mobility Bed Mobility               General bed mobility comments: oob  Transfers   Equipment used: Rolling walker (2 wheeled)   Sit to Stand: Supervision         General transfer comment: no cues needed this session    Balance                                           ADL either performed or assessed with clinical judgement   ADL                           Toilet Transfer: Supervision/safety;Ambulation;BSC;RW             General ADL Comments: ambulated to bathroom and performed above transfer. Cues for safety with RW through tight spaces.  Pt's finance has a tub seat or bench:  he is unsure which it is.  He doesn't want bench--will use whatever they have.  Educated on alternative methods for tub transfer and readiness if he has to step over tub.  Pt was issued AE kit yesterday.  He verbalizes understanding of all--reviewed sock aide.     Vision       Perception     Praxis      Cognition Arousal/Alertness: Awake/alert Behavior During Therapy: WFL for tasks assessed/performed Overall Cognitive Status: Within Functional Limits for tasks assessed                                          Exercises     Shoulder Instructions       General Comments      Pertinent Vitals/ Pain       Pain Score:  5  Pain Location: L hip Pain Descriptors / Indicators: Sore Pain Intervention(s): Limited activity within patient's tolerance;Monitored during session;Premedicated before session;Repositioned;Ice applied  Home Living                                          Prior Functioning/Environment              Frequency           Progress Toward Goals  OT Goals(current goals can now be found in the care plan section)  Progress towards OT goals:  (no further OT needs)     Plan      Co-evaluation                 AM-PAC PT "6 Clicks"  Daily Activity     Outcome Measure   Help from another person eating meals?: None Help from another person taking care of personal grooming?: A Little Help from another person toileting, which includes using toliet, bedpan, or urinal?: A Little Help from another person bathing (including washing, rinsing, drying)?: A Little Help from another person to put on and taking off regular upper body clothing?: A Little Help from another person to put on and taking off regular lower body clothing?: A Little 6 Click Score: 19    End of Session    OT Visit Diagnosis: Pain Pain - Right/Left: Left Pain - part of body: Hip   Activity Tolerance Patient tolerated treatment well   Patient Left in chair;with call bell/phone within reach   Nurse Communication          Time: 5217-4715 OT Time Calculation (min): 10 min  Charges: OT General Charges $OT Visit: 1 Visit OT Treatments $Self Care/Home Management : 8-22 mins  Allen Espinoza, OTR/L [phone] 12/01/2016   Allen Espinoza 12/01/2016, 10:37 AM

## 2016-12-02 LAB — CBC
HCT: 24.9 % — ABNORMAL LOW (ref 39.0–52.0)
Hemoglobin: 7.9 g/dL — ABNORMAL LOW (ref 13.0–17.0)
MCH: 29 pg (ref 26.0–34.0)
MCHC: 31.7 g/dL (ref 30.0–36.0)
MCV: 91.5 fL (ref 78.0–100.0)
PLATELETS: 245 10*3/uL (ref 150–400)
RBC: 2.72 MIL/uL — ABNORMAL LOW (ref 4.22–5.81)
RDW: 15.8 % — AB (ref 11.5–15.5)
WBC: 12.6 10*3/uL — AB (ref 4.0–10.5)

## 2016-12-02 LAB — PREPARE RBC (CROSSMATCH)

## 2016-12-02 LAB — BASIC METABOLIC PANEL
Anion gap: 8 (ref 5–15)
BUN: 24 mg/dL — ABNORMAL HIGH (ref 6–20)
CO2: 24 mmol/L (ref 22–32)
Calcium: 8.1 mg/dL — ABNORMAL LOW (ref 8.9–10.3)
Chloride: 107 mmol/L (ref 101–111)
Creatinine, Ser: 1.46 mg/dL — ABNORMAL HIGH (ref 0.61–1.24)
GFR, EST AFRICAN AMERICAN: 60 mL/min — AB (ref >60)
GFR, EST NON AFRICAN AMERICAN: 52 mL/min — AB (ref >60)
Glucose, Bld: 127 mg/dL — ABNORMAL HIGH (ref 65–99)
Potassium: 4.2 mmol/L (ref 3.5–5.1)
Sodium: 139 mmol/L (ref 135–145)

## 2016-12-02 MED ORDER — SODIUM CHLORIDE 0.9 % IV SOLN
Freq: Once | INTRAVENOUS | Status: AC
  Administered 2016-12-02: 10:00:00 via INTRAVENOUS

## 2016-12-02 NOTE — Progress Notes (Signed)
Subjective: 3 Days Post-Op Procedure(s) (LRB): LEFT TOTAL HIP ARTHROPLASTY ANTERIOR APPROACH (Left) Patient reports pain as moderate.  Making progress with therapy.  Creatine slightly improved.  On review of his records though, this is chronic renal insufficiency.  Hgb low (7.9).  Objective: Vital signs in last 24 hours: Temp:  [97.8 F (36.6 C)-98.6 F (37 C)] 98 F (36.7 C) (09/03 0536) Pulse Rate:  [65-77] 67 (09/03 0536) Resp:  [16-18] 16 (09/03 0536) BP: (136-157)/(75-79) 136/75 (09/03 0536) SpO2:  [100 %] 100 % (09/03 0536)  Intake/Output from previous day: 09/02 0701 - 09/03 0700 In: 1600 [P.O.:1600] Out: 1125 [Urine:1125] Intake/Output this shift: No intake/output data recorded.   Recent Labs  11/30/16 0907 12/01/16 0456 12/02/16 0429  HGB 9.0* 8.2* 7.9*    Recent Labs  12/01/16 0456 12/02/16 0429  WBC 15.2* 12.6*  RBC 2.79* 2.72*  HCT 25.8* 24.9*  PLT 264 245    Recent Labs  12/01/16 0456 12/02/16 0429  NA 139 139  K 4.5 4.2  CL 106 107  CO2 26 24  BUN 24* 24*  CREATININE 1.83* 1.46*  GLUCOSE 153* 127*  CALCIUM 8.8* 8.1*   No results for input(s): LABPT, INR in the last 72 hours.  Sensation intact distally Intact pulses distally Dorsiflexion/Plantar flexion intact Incision: scant drainage  Assessment/Plan: 3 Days Post-Op Procedure(s) (LRB): LEFT TOTAL HIP ARTHROPLASTY ANTERIOR APPROACH (Left) Up with therapy Plan for discharge tomorrow Discharge home with home health  Transfuse one unit of blood today.  [name] 12/02/2016, 8:06 AM

## 2016-12-02 NOTE — Progress Notes (Signed)
PT Cancellation Note  Patient Details Name: Allen Espinoza MRN: [id number] DOB: 1960-08-18   Cancelled Treatment:     wanted to see pt after his unit of blood however was not started till afternoon.  Pt will need one session PT in am before D/C.    Rica Koyanagi  PTA [facility]  Acute  Rehab Pager      [phone]

## 2016-12-03 LAB — CD4/CD8 (T-HELPER/T-SUPPRESSOR CELL)
CD4 absolute: 130 /uL — ABNORMAL LOW (ref 500–1900)
CD4%: 10 % — ABNORMAL LOW (ref 30.0–60.0)
CD8 T Cell Abs: 770 /uL (ref 230–1000)
CD8tox: 60 % — ABNORMAL HIGH (ref 15.0–40.0)
Ratio: 0.17 — ABNORMAL LOW (ref 1.0–3.0)
Total lymphocyte count: 1310 /uL (ref 1000–4000)

## 2016-12-03 LAB — TYPE AND SCREEN
ABO/RH(D): B POS
ANTIBODY SCREEN: NEGATIVE
UNIT DIVISION: 0

## 2016-12-03 LAB — BPAM RBC
BLOOD PRODUCT EXPIRATION DATE: 201809192359
ISSUE DATE / TIME: 201809031344
UNIT TYPE AND RH: 7300

## 2016-12-03 NOTE — Discharge Summary (Signed)
Patient ID: Allen Espinoza MRN: [id number] DOB/AGE: 11/14/1960 56 y.o.  Admit date: 11/29/2016 Discharge date: 12/03/2016  Admission Diagnoses:  Principal Problem:   Avascular necrosis of bone of hip, left ([place]) Active Problems:   Status post total replacement of left hip   Discharge Diagnoses:  Same  Past Medical History:  Diagnosis Date  . Abscess   . Arthritis    all over  . Avascular necrosis of femoral head, left ([place])   . Cholelithiasis   . CKD (chronic kidney disease) stage 3, GFR 30-59 ml/min    sees kidney Dr.  . DVT (deep venous thrombosis) ([facility])    legs  . Dyspnea    when walking  . Dysrhythmia    remembers mother taking about having an irregular rhythm years when he was a child   . GERD (gastroesophageal reflux disease)    takes Pepcid  . HIV (human immunodeficiency virus infection) ([place])   . Morbid obesity ([place])     Surgeries: Procedure(s): LEFT TOTAL HIP ARTHROPLASTY ANTERIOR APPROACH on 11/29/2016   Consultants:   Discharged Condition: Improved  Hospital Course: Allen Espinoza is an 56 y.o. male who was admitted 11/29/2016 for operative treatment ofAvascular necrosis of bone of hip, left ([place]). Patient has severe unremitting pain that affects sleep, daily activities, and work/hobbies. After pre-op clearance the patient was taken to the operating room on 11/29/2016 and underwent  Procedure(s): LEFT TOTAL HIP ARTHROPLASTY ANTERIOR APPROACH.    Patient was given perioperative antibiotics: Anti-infectives    Start     Dose/Rate Route Frequency Ordered Stop   11/30/16 0000  minocycline (MINOCIN,DYNACIN) 50 MG capsule     50 mg Oral 2 times daily 11/30/16 1017     11/29/16 2200  minocycline (MINOCIN,DYNACIN) capsule 50 mg     50 mg Oral 2 times daily 11/29/16 1255     11/29/16 1700  darunavir (PREZISTA) tablet 800 mg    Comments:  TAKE 1 TABLET BY MOUTH ONCE DAILY WITH BREAKFAST     800 mg Oral Daily with breakfast 11/29/16 1255     11/29/16 1700   elvitegravir-cobicistat-emtricitabine-tenofovir (GENVOYA) 150-150-200-10 MG tablet 1 tablet    Comments:  TAKE ONE TABLET BY MOUTH ONCE DAILY WITH BREAKFAST     1 tablet Oral Daily with breakfast 11/29/16 1255     11/29/16 1600  ceFAZolin (ANCEF) IVPB 2g/100 mL premix     2 g 200 mL/hr over 30 Minutes Intravenous Every 6 hours 11/29/16 1255 11/29/16 2242   11/29/16 0703  ceFAZolin (ANCEF) IVPB 1 g/50 mL premix     1 g 100 mL/hr over 30 Minutes Intravenous 30 min pre-op 11/29/16 0705 11/29/16 1001   11/29/16 0702  ceFAZolin (ANCEF) IVPB 2g/100 mL premix     2 g 200 mL/hr over 30 Minutes Intravenous 30 min pre-op 11/29/16 0705 11/29/16 0955   11/29/16 0619  ceFAZolin (ANCEF) 2-4 GM/100ML-% IVPB    Comments:  [name]   : cabinet override      11/29/16 [phone] 11/29/16 0949   11/29/16 0600  ceFAZolin (ANCEF) 3 g in dextrose 5 % 50 mL IVPB  Status:  Discontinued     3 g 130 mL/hr over 30 Minutes Intravenous On call to O.R. 11/28/16 1225 11/29/16 5027       Patient was given sequential compression devices, early ambulation, and chemoprophylaxis to prevent DVT.  Patient benefited maximally from hospital stay and there were no complications.    Recent vital signs: Patient Vitals for  the past 24 hrs:  BP Temp Temp src Pulse Resp SpO2  12/03/16 0524 (!) 146/74 98 F (36.7 C) Oral 70 16 100 %  12/02/16 2208 (!) 147/85 98.3 F (36.8 C) Oral 72 16 100 %  12/02/16 1659 139/69 98.3 F (36.8 C) Oral 72 16 -  12/02/16 1417 (!) 146/70 98.5 F (36.9 C) Oral 80 15 100 %  12/02/16 1337 (!) 155/83 97.6 F (36.4 C) Axillary 77 18 100 %     Recent laboratory studies:  Recent Labs  12/01/16 0456 12/02/16 0429  WBC 15.2* 12.6*  HGB 8.2* 7.9*  HCT 25.8* 24.9*  PLT 264 245  NA 139 139  K 4.5 4.2  CL 106 107  CO2 26 24  BUN 24* 24*  CREATININE 1.83* 1.46*  GLUCOSE 153* 127*  CALCIUM 8.8* 8.1*     Discharge Medications:   Allergies as of 12/03/2016      Reactions   Sulfa  Antibiotics Other (See Comments)   Low blood pressure   Vancomycin Itching   Give with benadryl      Medication List    TAKE these medications   darunavir 800 MG tablet Commonly known as:  PREZISTA TAKE 1 TABLET BY MOUTH ONCE DAILY WITH BREAKFAST   elvitegravir-cobicistat-emtricitabine-tenofovir 150-150-200-10 MG Tabs tablet Commonly known as:  GENVOYA TAKE ONE TABLET BY MOUTH ONCE DAILY WITH BREAKFAST   minocycline 50 MG capsule Commonly known as:  MINOCIN,DYNACIN Take 1 capsule (50 mg total) by mouth 2 (two) times daily.   oxyCODONE 5 MG immediate release tablet Commonly known as:  Oxy IR/ROXICODONE Take 1-2 tablets (5-10 mg total) by mouth every 4 (four) hours as needed for moderate pain, severe pain or breakthrough pain. What changed:  when to take this   rivaroxaban 20 MG Tabs tablet Commonly known as:  XARELTO Take 1 tablet (20 mg total) by mouth every morning.   tiZANidine 4 MG tablet Commonly known as:  ZANAFLEX Take 1 tablet (4 mg total) by mouth every 8 (eight) hours as needed for muscle spasms.            Durable Medical Equipment        Start     Ordered   11/29/16 1256  DME 3 n 1  Once     11/29/16 1255   11/29/16 1256  DME Walker rolling  Once    Question:  Patient needs a walker to treat with the following condition  Answer:  Status post total replacement of left hip   11/29/16 1255       Discharge Care Instructions        Start     Ordered   12/03/16 0000  Discharge patient    Question Answer Comment  Discharge disposition 01-Home or Self Care   Discharge patient date 12/03/2016      12/03/16 0709   11/30/16 0000  oxyCODONE (OXY IR/ROXICODONE) 5 MG immediate release tablet  Every 4 hours PRN     11/30/16 1017   11/30/16 0000  minocycline (MINOCIN,DYNACIN) 50 MG capsule  2 times daily     11/30/16 1017   11/30/16 0000  tiZANidine (ZANAFLEX) 4 MG tablet  Every 8 hours PRN     11/30/16 1017      Diagnostic Studies: Dg Pelvis  Portable  Result Date: 11/29/2016 CLINICAL DATA:  Left total hip replacement. EXAM: PORTABLE PELVIS 1-2 VIEWS COMPARISON:  Pelvic x-rays dated Aug 19, 2016. FINDINGS: Postsurgical changes related to interval left total hip  arthroplasty. Components are well aligned. No acute fracture or malalignment. Expected postsurgical changes including subcutaneous and intra-articular emphysema. Mild degenerative changes of the right hip joint. IMPRESSION: Left total hip arthroplasty without evidence of acute postoperative complication. These results will be called to the ordering clinician or representative by the Radiology Department at the imaging location. Electronically Signed   By: Titus Dubin M.D.   On: 11/29/2016 14:07   Dg C-arm 1-60 Min-no Report  Result Date: 11/29/2016 Fluoroscopy was utilized by the requesting physician.  No radiographic interpretation.   Dg Hip Operative Unilat W Or W/o Pelvis Left  Result Date: 11/29/2016 CLINICAL DATA:  Left total hip replacement. EXAM: OPERATIVE LEFT HIP (WITH PELVIS IF PERFORMED) 1 VIEW TECHNIQUE: Fluoroscopic spot image(s) were submitted for interpretation post-operatively. Fluoroscopy time was 0.4 minutes. COMPARISON:  Pelvic x-rays dated Aug 19, 2016. FINDINGS: Intraoperative x-rays demonstrating interval left total hip arthroplasty. Components are well aligned. No definite complication. IMPRESSION: Interval left total hip arthroplasty. Electronically Signed   By: Titus Dubin M.D.   On: 11/29/2016 12:53    Disposition: 01-Home or Self Care  Discharge Instructions    Discharge patient    Complete by:  As directed    Discharge disposition:  01-Home or Self Care   Discharge patient date:  12/03/2016      Follow-up Information    [name], MD. Schedule an appointment as soon as possible for a visit in 2 week(s).   Specialty:  Orthopedic Surgery Contact information: [place] [address] [phone]         [name], MD Follow up.   Specialty:  Orthopedic Surgery Contact information: [place] [address] [phone]        [facility] Follow up.   Specialty:  [place] Why:  Home Health Physical Therapy- agency will call to arrange first visit Contact information: [place] [phone]            Signed: [name] 12/03/2016, 7:09 AM

## 2016-12-03 NOTE — Progress Notes (Signed)
Patient discharged to home w/ family. Given all belongings, instructions, prescriptions, equipment. Fiance present during teaching. Escorted to [place] via w/c.

## 2016-12-03 NOTE — Progress Notes (Signed)
Physical Therapy Treatment Patient Details Name: Allen Espinoza MRN: [id number] DOB: 07-Aug-1960 Today's Date: 12/03/2016    History of Present Illness Pt s/p L THR and with hx of HIV and CKD    PT Comments    Progressing with mobility. Reviewed exercises and gait training. Pt practiced stair training on a previous date-verbally reviewed technique on today". All education completed. Okay to d/c from PT standpoint.    Follow Up Recommendations  Home health PT     Equipment Recommendations  Rolling walker with 5" wheels;3in1 (PT)    Recommendations for Other Services OT consult     Precautions / Restrictions Precautions Precautions: Fall Restrictions Weight Bearing Restrictions: No LLE Weight Bearing: Weight bearing as tolerated    Mobility  Bed Mobility               General bed mobility comments: OOB in recliner  Transfers Overall transfer level: Needs assistance Equipment used: Rolling walker (2 wheeled) Transfers: Sit to/from Stand Sit to Stand: Supervision         General transfer comment: for safety. Increased time.   Ambulation/Gait Ambulation/Gait assistance: Min guard Ambulation Distance (Feet): 125 Feet Assistive device: Rolling walker (2 wheeled) Gait Pattern/deviations: Step-to pattern;Step-through pattern;Trunk flexed;Decreased stride length     General Gait Details: close guard for safety.    Stairs         General stair comments: Pt practiced steps on 9/2. He denied need to practice again. Verbally reviewed technique.   Wheelchair Mobility    Modified Rankin (Stroke Patients Only)       Balance                                            Cognition Arousal/Alertness: Awake/alert Behavior During Therapy: WFL for tasks assessed/performed Overall Cognitive Status: Within Functional Limits for tasks assessed                                        Exercises Total Joint Exercises Quad Sets:  AROM;Both;10 reps;Supine Hip ABduction/ADduction: AAROM;Left;10 reps;Supine Long Arc Quad: AROM;Left;10 reps;Seated Knee Flexion: AROM;Left;10 reps;Standing Marching in Standing: AROM;Both;10 reps;Standing General Exercises - Lower Extremity Heel Raises: AROM;Both;10 reps;Standing    General Comments        Pertinent Vitals/Pain Pain Assessment: Faces Faces Pain Scale: Hurts little more Pain Location: L hip/thigh Pain Descriptors / Indicators: Tightness;Sore Pain Intervention(s): Monitored during session;Repositioned;Ice applied    Home Living                      Prior Function            PT Goals (current goals can now be found in the care plan section) Progress towards PT goals: Progressing toward goals    Frequency    7X/week      PT Plan Current plan remains appropriate    Co-evaluation              AM-PAC PT "6 Clicks" Daily Activity  Outcome Measure  Difficulty turning over in bed (including adjusting bedclothes, sheets and blankets)?: A Little Difficulty moving from lying on back to sitting on the side of the bed? : A Little Difficulty sitting down on and standing up from a chair with arms (e.g., wheelchair, bedside commode,  etc,.)?: A Little Help needed moving to and from a bed to chair (including a wheelchair)?: A Little Help needed walking in hospital room?: A Little Help needed climbing 3-5 steps with a railing? : A Lot 6 Click Score: 17    End of Session   Activity Tolerance: Patient tolerated treatment well Patient left: in chair;with call bell/phone within reach   PT Visit Diagnosis: Muscle weakness (generalized) (M62.81);Difficulty in walking, not elsewhere classified (R26.2)     Time: 0354-6568 PT Time Calculation (min) (ACUTE ONLY): 11 min  Charges:  $Gait Training: 8-22 mins                    G Codes:          Weston Anna, MPT Pager: [phone]

## 2016-12-03 NOTE — Progress Notes (Signed)
Patient ID: Allen Espinoza, male   DOB: September 05, 1960, 56 y.o.   MRN: [id number] Doing much better overall and feeling better.  Can be discharged to home today.

## 2016-12-04 ENCOUNTER — Telehealth (INDEPENDENT_AMBULATORY_CARE_PROVIDER_SITE_OTHER): Payer: Self-pay | Admitting: Orthopaedic Surgery

## 2016-12-04 NOTE — Telephone Encounter (Signed)
[facility] case manager with [facility] called advised patient was discharged with [facility] orders for (PT) [facility] Kidney also need orders for eval and treat for the patient. The number to contact [facility] Kidney is [phone]   Fax # is [phone]

## 2016-12-04 NOTE — Telephone Encounter (Signed)
Faxed order to provided number  ?

## 2016-12-05 NOTE — Telephone Encounter (Signed)
Charisse with [facility] called for verbal orders for 2 x week for 1 week 3 x week for 1 week 2 x week for 1 week Gave okay for verbal orders. Cb# is [phone].

## 2016-12-11 ENCOUNTER — Ambulatory Visit (INDEPENDENT_AMBULATORY_CARE_PROVIDER_SITE_OTHER): Payer: Medicaid Other | Admitting: Orthopaedic Surgery

## 2016-12-11 DIAGNOSIS — Z96642 Presence of left artificial hip joint: Secondary | ICD-10-CM

## 2016-12-11 MED ORDER — OXYCODONE HCL 5 MG PO TABS: 5.0000 mg | ORAL_TABLET | Freq: Four times a day (QID) | ORAL | 0 refills | Status: DC | PRN

## 2016-12-11 MED ORDER — MINOCYCLINE HCL 50 MG PO CAPS: 50.0000 mg | ORAL_CAPSULE | Freq: Two times a day (BID) | ORAL | 0 refills | Status: DC

## 2016-12-11 NOTE — Progress Notes (Signed)
The patient is 2 weeks status post a left total hip arthroplasty direct anterior approach. He is doing well overall. Has no complaints. He is having home health therapy.  On exam disable sooner root insertion supply. The wound incision looks good. There is no significant seroma. His leg was are equal.   He'll continue increase his activities as comfort allows. 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 See him back in 4 to see how is doing overall but no x-rays are needed.

## 2016-12-16 ENCOUNTER — Telehealth (INDEPENDENT_AMBULATORY_CARE_PROVIDER_SITE_OTHER): Payer: Self-pay | Admitting: Orthopaedic Surgery

## 2016-12-16 NOTE — Telephone Encounter (Signed)
Allen Espinoza with [facility] called regarding a prior authorization for his medication.  The prior authorization has not been approved for the generic brand. . The prior authorization needs to be put in for the generic brand instead of the name brand.  CB#[id number].

## 2016-12-16 NOTE — Op Note (Signed)
NAME:  Allen Espinoza, Allen Espinoza                      ACCOUNT NO.:  MEDICAL RECORD NO.:  [id number]  LOCATION:                                 FACILITY:  PHYSICIAN:  Lind Guest. Ninfa Linden, M.D.DATE OF BIRTH:  Aug 01, 1960  DATE OF PROCEDURE:  11/29/2016 DATE OF DISCHARGE:                              OPERATIVE REPORT   PREOPERATIVE DIAGNOSIS:  Severe osteonecrosis with superimposed osteoarthritis, left hip.  POSTOPERATIVE DIAGNOSIS:  Severe osteonecrosis with superimposed osteoarthritis, left hip.  PROCEDURE:  Left total hip arthroplasty through direct anterior approach.  IMPLANTS:  DePuy Sector Gription acetabular component size 56, size 36 +0 neutral polyethylene liner, size 13 Corail femoral component with standard offset, size 36 +1.5 ceramic hip ball.  SURGEON:  Lind Guest. Ninfa Linden, M.D.  ASSISTANT:  Erskine Emery, PA-C.  ANESTHESIA:  Spinal.  ANTIBIOTICS:  3 g of IV Ancef.  BLOOD LOSS:  Per anesthesia record.  COMPLICATIONS:  None.  INDICATIONS:  Mr. Elsass is a 56 year old gentleman, well known to me.  He has osteoarthritis and osteonecrosis of the left femoral head.  He has tried and failed all forms of conservative treatment, and his x-ray showed severe disease in the right hip.  His pain is daily and it has detrimentally affected his activities of daily living, his quality of life and his mobility.  At this point, he does wish to proceed with hip replacement surgery.  He understands the risk of acute blood loss anemia, nerve and vessel injury, fracture, infection, dislocation, and DVT.  He understands our goals are to decrease pain, improve mobility, and overall improved quality of life.  PROCEDURE DESCRIPTION:  After informed consent was obtained, appropriate left hip was marked.  He was brought to the operating room.  Spinal anesthesia was obtained.  Traction boots were placed on both his feet. Next, he was placed supine on the [place] fracture table with the  perineal post in place, both legs in inline skeletal traction devices, but no traction applied.  His left operative hip was prepped and draped with DuraPrep and sterile drapes.  A time-out was called, he was identified as correct patient and correct left hip.  We then made an incision just inferior and posterior to the anterior superior iliac spine and carried this obliquely down the leg.  We dissected down the tensor fascia lata muscle and tensor fascia was then divided longitudinally, so we could proceed with direct anterior approach to the hip.  We identified and cauterized the circumflex vessels and then identified the hip capsule. I opened up the hip capsule in an L-type format, finding a moderate joint effusion and significant disease in the left hip.  We placed Cobra retractors around the medial and lateral femoral neck and then made our femoral neck cut with an oscillating saw and completed this with an osteotome.  I placed a corkscrew guide in the femoral head and removed the femoral head in its entirety, found it to be devoid of cartilage with areas of avascular necrosis and osteoarthritis.  We then placed a bent Hohmann over the medial acetabular rim and cleaned remnants of the acetabular labrum and other debris from the acetabulum.  We then began reaming under direct visualization from a size 42 reamer and going in stepwise increments up to size 54.  When we were pleased with this, all reamers were placed under direct visualization, the last reamer under fluoroscopy, so we could obtain our depth of reaming, our inclination and anteversion.  Once we were pleased with this, we placed the real DePuy Sector Gription acetabular component size 54 and a 36+ 0 neutral polyethylene liner for that size acetabular component.  Attention was then turned to the femur.  With the leg externally rotated to 120 degrees, extended and adducted, we were able to place a Mueller retractor medially  and a Hohmann retractor behind the greater trochanter.  We released the lateral joint capsule and used a box cutting osteotome to enter the femoral canal and a rongeur to lateralize.  We then began broaching from a size 8 broach using the Corail broaching system and stepwise increments up to size 13.  With the size 13, we trialed a standard offset femoral neck and a 36+ 1.5 hip ball.  We brought the leg back over and up with traction and internal rotation reducing the pelvis.  We were pleased with leg length, offset, range of motion and stability.  We then dislocated the hip and removed the trial components.  We were able to place the real Corail femoral component with standard offset, size 13 and the real 36 +1.5 ceramic hip ball.  Again, we reduced this in the acetabulum and we were pleased with leg length, offset, stability and range of motion.  We then irrigated the soft tissue with normal saline solution using pulsatile lavage.  We were able to close the joint capsule with interrupted #1 Ethibond suture followed by running #1 Vicryl in the tensor fascia, 0 Vicryl in the deep tissue, 2-0 Vicryl in the subcutaneous tissue, interrupted staples on the skin.  Xeroform and Aquacel dressing were applied.  He was taken to the recovery room in stable condition.  All final counts were correct. There were no complications noted.  Of note, Erskine Emery, PA-C, assisted in the entire case.  His assistance was crucial facilitating all aspects of this case.     Lind Guest. Ninfa Linden, M.D.     CYB/MEDQ  D:  12/13/2016  T:  12/13/2016  Job:  [id number]

## 2016-12-17 NOTE — Telephone Encounter (Signed)
Medication approved #[id number]

## 2016-12-18 ENCOUNTER — Encounter: Payer: Self-pay | Admitting: Internal Medicine

## 2016-12-20 ENCOUNTER — Emergency Department (HOSPITAL_COMMUNITY)
Admit: 2016-12-20 | Discharge: 2016-12-20 | Disposition: A | Discharge status: Home / Self Care | Payer: Medicaid Other | Attending: Emergency Medicine | Admitting: Emergency Medicine

## 2016-12-20 ENCOUNTER — Emergency Department (HOSPITAL_COMMUNITY)
Admission: EM | Admit: 2016-12-20 | Discharge: 2016-12-20 | Disposition: A | Discharge status: Home / Self Care | Payer: Medicaid Other | Attending: Emergency Medicine | Admitting: Emergency Medicine

## 2016-12-20 ENCOUNTER — Encounter (HOSPITAL_COMMUNITY): Payer: Self-pay

## 2016-12-20 DIAGNOSIS — I82432 Acute embolism and thrombosis of left popliteal vein: Secondary | ICD-10-CM | POA: Insufficient documentation

## 2016-12-20 DIAGNOSIS — N189 Chronic kidney disease, unspecified: Secondary | ICD-10-CM | POA: Insufficient documentation

## 2016-12-20 DIAGNOSIS — Z96642 Presence of left artificial hip joint: Secondary | ICD-10-CM | POA: Diagnosis not present

## 2016-12-20 DIAGNOSIS — I825Z2 Chronic embolism and thrombosis of unspecified deep veins of left distal lower extremity: Secondary | ICD-10-CM | POA: Diagnosis not present

## 2016-12-20 DIAGNOSIS — Z79899 Other long term (current) drug therapy: Secondary | ICD-10-CM | POA: Diagnosis not present

## 2016-12-20 DIAGNOSIS — R2242 Localized swelling, mass and lump, left lower limb: Secondary | ICD-10-CM | POA: Diagnosis present

## 2016-12-20 MED ORDER — RIVAROXABAN 15 MG PO TABS
15.0000 mg | ORAL_TABLET | Freq: Once | ORAL | Status: AC
  Administered 2016-12-20: 15 mg via ORAL
  Filled 2016-12-20 (×2): qty 1

## 2016-12-20 MED ORDER — RIVAROXABAN (XARELTO) VTE STARTER PACK (15 & 20 MG): 15.0000 mg | ORAL_TABLET | Freq: Two times a day (BID) | ORAL | 0 refills | Status: DC

## 2016-12-20 NOTE — Discharge Instructions (Signed)
Stop taking your current xarelto dose. Start taking Xarelto twice a day, 15 mg. Do this for 3 weeks. After that, switch to a 20 mg once daily dose. Follow-up with the hematology doctor. His office will call you to schedule an appointment. If you do not hear from him in several days, you may contact his office, number listed below. Return to the emergency room if you develop chest pain, shortness of breath, or difficulty breathing. Return if you develop bleeding from your rectum or start vomiting blood. Return if you have any new, worsening, or concerning symptoms.  Information on my medicine - XARELTO (rivaroxaban)  This medication education was reviewed with me or my healthcare representative as part of my discharge preparation.  The pharmacist that spoke with me during my hospital stay was:  Henreitta Leber. PharmD  WHY WAS [place]? Xarelto was prescribed to treat blood clots that may have been found in the veins of your legs (deep vein thrombosis) or in your lungs (pulmonary embolism) and to reduce the risk of them occurring again.  What do you need to know about Xarelto? The starting dose is one 15 mg tablet taken TWICE daily with food for the FIRST 21 DAYS then on Friday, October 12th  the dose is changed to one 20 mg tablet taken ONCE A DAY with your evening meal.  DO NOT stop taking Xarelto without talking to the health care provider who prescribed the medication.  Refill your prescription for 20 mg tablets before you run out.  After discharge, you should have regular check-up appointments with your healthcare provider that is prescribing your Xarelto.  In the future your dose may need to be changed if your kidney function changes by a significant amount.  What do you do if you miss a dose? If you are taking Xarelto TWICE DAILY and you miss a dose, take it as soon as you remember. You may take two 15 mg tablets (total 30 mg) at the same time then resume your regularly  scheduled 15 mg twice daily the next day.  If you are taking Xarelto ONCE DAILY and you miss a dose, take it as soon as you remember on the same day then continue your regularly scheduled once daily regimen the next day. Do not take two doses of Xarelto at the same time.   Important Safety Information Xarelto is a blood thinner medicine that can cause bleeding. You should call your healthcare provider right away if you experience any of the following: ? Bleeding from an injury or your nose that does not stop. ? Unusual colored urine (red or dark brown) or unusual colored stools (red or black). ? Unusual bruising for unknown reasons. ? A serious fall or if you hit your head (even if there is no bleeding).  Some medicines may interact with Xarelto and might increase your risk of bleeding while on Xarelto. To help avoid this, consult your healthcare provider or pharmacist prior to using any new prescription or non-prescription medications, including herbals, vitamins, non-steroidal anti-inflammatory drugs (NSAIDs) and supplements.  This website has more information on Xarelto: [email or web address]

## 2016-12-20 NOTE — ED Triage Notes (Signed)
Patient had a hip replacement 3 weeks ago. Patient has a history of blood clots. Patient states he has swelling in both legs, but L>R.

## 2016-12-20 NOTE — ED Provider Notes (Signed)
Pharmacist called to clarify Xarelto dosing, as patient is already on Xarelto 20 mg q daily. Informed them that pt needs to be on xarelto 15 mg bid for 3 weeks in place of 20 mg q daily dosing for now based on Dr. Eugenio Hoes note.   Varney Biles, MD 12/20/16 1729

## 2016-12-20 NOTE — ED Provider Notes (Signed)
[facility] DEPT Provider Note   CSN: [id number] Arrival date & time: 12/20/16  9326     History   Chief Complaint Chief Complaint  Patient presents with  . Leg Swelling    HPI Allen Espinoza is a 56 y.o. male presenting with left lower leg swelling.  Patient states that he had a hip replacement 3 weeks ago. He was told to stop his Xarelto for the week prior to surgery, and was resumed on Xarelto immediately after surgery. Since then, he's had swelling of his left leg. He thinks it might have gotten worse recently, but he is not sure. He reports it feels tight, but is denying pain. Reports some mild swelling of the right leg as well. Patient has a history of DVT, and is on blood thinners. He has an IVC filter. He does not know why he had a DVT, stating that they never figured it out. He has been taking Xarelto as prescribed, last dose was 20 mg yesterday at 9:30 am. He was evaluated by his orthopedic doctor on the 12th and told that swelling is expected after surgery. Patient thinks that it should have improved by now. Additionally, patient reports twenty-year history of bumps. Initial lesion was on his forearm, he has 2 on his abdomen, and one on his left anterior thigh. He states they appear, and slowly grow. They do not hurt. He has never had them evaluated. Patient denies fever, chills, cough, chest pain, shortness of breath, nausea, vomiting, abdominal pain, urinary symptoms, or abnormal bowel movements.   HPI  Past Medical History:  Diagnosis Date  . Abscess   . Arthritis    all over  . Avascular necrosis of femoral head, left ([place])   . Cholelithiasis   . CKD (chronic kidney disease) stage 3, GFR 30-59 ml/min    sees kidney Dr.  . DVT (deep venous thrombosis) (HCC)    legs  . Dyspnea    when walking  . Dysrhythmia    remembers mother taking about having an irregular rhythm years when he was a child   . GERD (gastroesophageal reflux disease)    takes Pepcid  . HIV (human  immunodeficiency virus infection) ([place])   . Morbid obesity [facility])     Patient Active Problem List   Diagnosis Date Noted  . Status post total replacement of left hip 11/29/2016  . Unilateral primary osteoarthritis, left hip 09/16/2016  . Edema 08/20/2016  . 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 ([place]) 08/19/2016  . Morbid (severe) obesity due to excess calories ([place]) 08/19/2016  . Pain in left hip 08/19/2016  . Solitary pulmonary nodule 05/06/2016  . Hidradenitis suppurativa 04/27/2016  . Chest pain 04/26/2016  . Avascular necrosis of bone of hip, left ([place]) 03/19/2016  . Human immunodeficiency virus (HIV) disease ([place]) 03/18/2016  . DVT (deep venous thrombosis) ([place]) 03/18/2016  . Axillary abscess 03/18/2016  . Chronic kidney disease 03/18/2016  . Encounter for immunization 03/18/2016  . Cholelithiasis 03/18/2016  . Diverticulosis of colon without hemorrhage 03/18/2016    Past Surgical History:  Procedure Laterality Date  . COLONOSCOPY    . DENTAL SURGERY     had teeth pulled  . HYDRADENITIS EXCISION Left 10/14/2016   Procedure: WIDE EXCISION HIDRADENITIS LEFT AXILLA;  Surgeon: Coralie Keens, MD;  Location: [place];  Service: General;  Laterality: Left;  . JOINT REPLACEMENT     Left hip Dr. Ninfa Linden 11/29/16  . TOTAL HIP ARTHROPLASTY Left 11/29/2016  Procedure: LEFT TOTAL HIP ARTHROPLASTY ANTERIOR APPROACH;  Surgeon: [name], MD;  Location[facility];  Service: Orthopedics;  Laterality: Left;       Home Medications    Prior to Admission medications   Medication Sig Start Date End Date Taking? Authorizing Provider  darunavir (PREZISTA) 800 MG tablet TAKE 1 TABLET BY MOUTH ONCE DAILY WITH BREAKFAST 11/01/16  Yes Sid Falcon, MD  elvitegravir-cobicistat-emtricitabine-tenofovir (GENVOYA) 150-150-200-10 MG TABS tablet TAKE ONE TABLET BY MOUTH ONCE DAILY WITH BREAKFAST 11/01/16  Yes Sid Falcon, MD  ibuprofen (ADVIL,MOTRIN)  200 MG tablet Take 800 mg by mouth every 6 (six) hours as needed for moderate pain.   Yes [provider]  minocycline (MINOCIN,DYNACIN) 50 MG capsule Take 1 capsule (50 mg total) by mouth 2 (two) times daily. 12/11/16  Yes [name], MD  oxyCODONE (OXY IR/ROXICODONE) 5 MG immediate release tablet Take 1-2 tablets (5-10 mg total) by mouth every 6 (six) hours as needed for moderate pain, severe pain or breakthrough pain. Patient not taking: Reported on 12/20/2016 12/11/16   [name], MD  Rivaroxaban 15 & 20 MG TBPK Take 15 mg by mouth 2 (two) times daily. Take as directed on package: Start with one 15mg  tablet by mouth twice a day with food. On Day 22, switch to one 20mg  tablet once a day with food. 12/20/16   Theophile Harvie, PA-C  tiZANidine (ZANAFLEX) 4 MG tablet Take 1 tablet (4 mg total) by mouth every 8 (eight) hours as needed for muscle spasms. Patient not taking: Reported on 12/20/2016 11/30/16   [name], MD    Family History Family History  Problem Relation Age of Onset  . Heart attack Mother   . Heart attack Father     Social History Social History  Substance Use Topics  . Smoking status: Never Smoker  . Smokeless tobacco: Never Used  . Alcohol use No     Comment: prior     Allergies   Sulfa antibiotics and Vancomycin   Review of Systems Review of Systems  Cardiovascular: Positive for leg swelling.  Allergic/Immunologic: Positive for immunocompromised state.  Hematological: Bruises/bleeds easily.  All other systems reviewed and are negative.    Physical Exam Updated Vital Signs BP (!) 167/75 (BP Location: Right Arm)   Pulse (!) 51   Temp 98.2 F (36.8 C) (Oral)   Resp 18   Ht 6' (1.829 m)   Wt 133.8 kg (295 lb)   SpO2 100%   BMI 40.01 kg/m   Physical Exam  Constitutional: He is oriented to person, place, and time. He appears well-developed and well-nourished. No distress.  HENT:  Head: Normocephalic and  atraumatic.  Eyes: EOM are normal.  Neck: Normal range of motion.  Cardiovascular: Normal rate, regular rhythm and intact distal pulses.   Pulmonary/Chest: Effort normal and breath sounds normal. No respiratory distress. He has no wheezes. He has no rales. He exhibits no tenderness.  Abdominal: Soft. Bowel sounds are normal. He exhibits no distension and no mass. There is no tenderness. There is no guarding.  Musculoskeletal: Normal range of motion.  Swelling of left leg. Pitting edema extending to the knee. No tenderness to palpation of calf. Pedal and popliteal pulses intact bilaterally. Color and warmth equal bilaterally. Approximately 1 inch diameter soft, mobile, non-tender lesion of anterior left thigh, right forearm, and left upper quadrant abdomen. No erythema overlying any lesion. They are not fluctuant or indurated.   Lymphadenopathy:  He has no cervical adenopathy.  Neurological: He is alert and oriented to person, place, and time.  Skin: Skin is warm and dry.  Psychiatric: He has a normal mood and affect.  Nursing note and vitals reviewed.    ED Treatments / Results  Labs (all labs ordered are listed, but only abnormal results are displayed) Labs Reviewed - No data to display  EKG  EKG Interpretation None       Radiology No results found.  Procedures Procedures (including critical care time)  Medications Ordered in ED Medications  Rivaroxaban (XARELTO) tablet 15 mg (15 mg Oral Given 12/20/16 1200)     Initial Impression / Assessment and Plan / ED Course  I have reviewed the triage vital signs and the nursing notes.  Pertinent labs & imaging results that were available during my care of the patient were reviewed by me and considered in my medical decision making (see chart for details).     Patient presenting with swelling of left lower extremity 3 weeks status post left hip replacement. Patient reports tightness but no pain. History of DVT, on Xarelto.  Possible postop edema, but will order DVT study to rule out acute dvt. Lesions on skin have been present for 20 years. No sign of erythema or infection. They're not fluctuant or indurated. They are soft and nontender. Likely lipoma. Doubt abscess, infection, or malignant process. Patient to follow-up with PCP for evaluation of these lesions.  DVT study shows chronic clots, and acute popliteal blood clot of the left leg. As patient has IVC filter, and does not have any chest pain or shortness of breath, doubt PE. Consult with pharmacy, and recommended consult with hematology, with possible plan of twice a day dosing of Xarelto for 3 weeks, and resuming 20 mg once daily dosing afterwards. Consulted with hematology, and Dr. Cathlyn Parsons agrees to twice a day dosing of Xarelto, and follow-up with his office next week. Discussed case with attending, and Dr. Wilson Singer agrees to plan. At this time, patient appears safe for discharge. Strict return precautions given. Patient states he understands and agrees to plan.  Final Clinical Impressions(s) / ED Diagnoses   Final diagnoses:  Acute deep vein thrombosis (DVT) of popliteal vein of left lower extremity (HCC)  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 ([place])    New Prescriptions Discharge Medication List as of 12/20/2016 11:52 AM    START taking these medications   Details  Rivaroxaban 15 & 20 MG TBPK Take 15 mg by mouth 2 (two) times daily. Take as directed on package: Start with one 15mg  tablet by mouth twice a day with food. On Day 22, switch to one 20mg  tablet once a day with food., Starting Fri 12/20/2016, Print         [place], [place], PA-C 12/20/16 1307    Virgel Manifold, MD 12/21/16 [phone]

## 2016-12-20 NOTE — Progress Notes (Signed)
VASCULAR LAB PRELIMINARY  PRELIMINARY  PRELIMINARY  PRELIMINARY  Left lower extremity venous duplex completed.    Preliminary report:  Left - Positive for an acute DVT of the popliteal vein. Chronic DVT also noted in the common femoral vein. No evidence of a superficial thrombosis or Baker's cyst.  Shondell Poulson, RVTS 12/20/2016, 10:59 AM

## 2016-12-23 ENCOUNTER — Telehealth (INDEPENDENT_AMBULATORY_CARE_PROVIDER_SITE_OTHER): Payer: Self-pay | Admitting: Orthopaedic Surgery

## 2016-12-23 NOTE — Telephone Encounter (Signed)
Sheree from [facility] called saying that the patient was admitted to the ER last Friday and was diagnosed with a new blood clot and was wondering if she needed to continue therapy. CB # [phone]

## 2016-12-23 NOTE — Telephone Encounter (Signed)
Please advise 

## 2016-12-23 NOTE — Telephone Encounter (Signed)
It is still fine with them working on therapy for him since that is a hip replacement and they're only working on gait training, balance, and coordination. They should continue therapy on him.

## 2016-12-24 NOTE — Telephone Encounter (Signed)
Verbal orders given to [employer]

## 2016-12-25 ENCOUNTER — Telehealth: Payer: Self-pay | Admitting: Hematology

## 2016-12-25 NOTE — Telephone Encounter (Signed)
Appt has been scheduled for the pt to see Dr. Irene Limbo on 10/1 at 11am. Pt aware to arrive 30 minutes early. Address and insurance verified.

## 2016-12-25 NOTE — Progress Notes (Signed)
HEMATOLOGY/ONCOLOGY CONSULTATION NOTE  Date of Service: 12/30/2016  Patient Care Team: Welford Roche, MD as PCP - General  CHIEF COMPLAINTS/PURPOSE OF CONSULTATION:  Recurrent DVT  HISTORY OF PRESENTING ILLNESS:   Allen Espinoza is a wonderful 56 y.o. male who has been referred to [place] by Welford Roche, MD for evaluation and management of DVT while on Xarelto 20 mg daily. He is accompanied by his fiancee.   Of note, patient was recently evaluated in the ED on 12/20/2016 and had a DVT study with results of: "Left - Positive for an acute DVT of the popliteal vein. Chronic DVT also noted in the common femoral vein. No evidence of a superficial thrombosis or Baker's cyst."   He states that his first occurrence of DVT was in 2013 to his LLE while in [place] and treated with warfarin which was changed to xarelto. He notes that he had swelling to bilateral legs. Pt reports that he had an insect bite occur prior to the DVT diagnosis. He states that he has been on long-term blood thinners since the initial DVT in 2013 and was informed by his PCP in [place]. Pt reports that he was not given a reason for why he would be on long-term anticoagulants. He used to take 20 mg once a day and is now on 15 mg BID. Pt denies hx of PE but notes that he has an IVC filter placed. He denies prior blood clots or family hx of blood clots. Pt notes that he was never informed of the initial reason for his DVT. He notes that the swelling to his BLE gradually improved initially.   He states that he has a left hip replacement completed on 11/29/2016 due to avascular necrosis and had his Xarelto held for 1 week prior to surgery. Pt reports that he didn't have bridging of anticoagulant medications. Pt notes that he was placed back onto anticoagulants (Xarelto 20 mg daily) the next day following surgery. He reports that he had intermittent BLE swelling x 3 weeks following surgery. He reports that is he sleeps with his feet  below heart level, he feels the tightness in his bilateral ankles. Pt states that he was evaluated in the ED on 12/20/2016 and placed on 15 mg Xarelto BID. He reports that prior to an increase in his anticoagulant medication, he was 310 lbs.   He reports that he was dx with HIV in 2007 and is evaluated by Infectious Dr. [name]. He has stage 3 CKD that he was recently diagnosed with and he has a nephrologist that he is followed by a specialist for. Pt PCP is Dr. Isac Sarna. He denies hx of sleep apnea, but his fiancee notes that the patient. He reports that he underwent hidradenitis suppurativa surgery in June to his left axilla and inguinal area and notes that the areas are still oozing at this time.   On review of systems, pt denies bleeding issues. He reports weight gain of 30 lbs within the past year (265 lb) since 2013 and his current baseline is (290 lb). He reports intermittent "bumps" to bilateral forearms and chest. He reports intermittent CP following eating or prolonged ambulation. He denies fatigue or lightheadedness. He denies fever, chills, night sweats, weight loss, or mouth sores. He denies abdominal pain, blood in stools. He denies CP or SOB. He reports gradually improving BLE Swelling.    MEDICAL HISTORY:  Past Medical History:  Diagnosis Date  . Abscess   . Arthritis  all over  . Avascular necrosis of femoral head, left ([place])   . Cholelithiasis   . CKD (chronic kidney disease) stage 3, GFR 30-59 ml/min    sees kidney Dr.  . DVT (deep venous thrombosis) (HCC)    legs  . Dyspnea    when walking  . Dysrhythmia    remembers mother taking about having an irregular rhythm years when he was a child   . GERD (gastroesophageal reflux disease)    takes Pepcid  . HIV (human immunodeficiency virus infection) ([place])   . Morbid obesity ([place])     SURGICAL HISTORY: Past Surgical History:  Procedure Laterality Date  . COLONOSCOPY    . DENTAL SURGERY     had teeth pulled  .  HYDRADENITIS EXCISION Left 10/14/2016   Procedure: WIDE EXCISION HIDRADENITIS LEFT AXILLA;  Surgeon: Coralie Keens, MD;  Location: [place];  Service: General;  Laterality: Left;  . JOINT REPLACEMENT     Left hip Dr. Ninfa Linden 11/29/16  . TOTAL HIP ARTHROPLASTY Left 11/29/2016   Procedure: LEFT TOTAL HIP ARTHROPLASTY ANTERIOR APPROACH;  Surgeon: [name], MD;  Location: [facility];  Service: Orthopedics;  Laterality: Left;    SOCIAL HISTORY: Social History   Social History  . Marital status: Single    Spouse name: N/A  . Number of children: N/A  . Years of education: N/A   Occupational History  . Not on file.   Social History Main Topics  . Smoking status: Never Smoker  . Smokeless tobacco: Never Used  . Alcohol use No     Comment: prior  . Drug use: No     Comment: prior cocaine use, last 2005  . Sexual activity: Yes    Partners: Female    Birth control/ protection: Condom   Other Topics Concern  . Not on file   Social History Narrative  . No narrative on file    FAMILY HISTORY: Family History  Problem Relation Age of Onset  . Heart attack Mother   . Heart attack Father     ALLERGIES:  is allergic to sulfa antibiotics and vancomycin.  MEDICATIONS:  Current Outpatient Prescriptions  Medication Sig Dispense Refill  . darunavir (PREZISTA) 800 MG tablet TAKE 1 TABLET BY MOUTH ONCE DAILY WITH BREAKFAST 30 tablet 3  . elvitegravir-cobicistat-emtricitabine-tenofovir (GENVOYA) 150-150-200-10 MG TABS tablet TAKE ONE TABLET BY MOUTH ONCE DAILY WITH BREAKFAST 30 tablet 3  . minocycline (MINOCIN,DYNACIN) 50 MG capsule Take 1 capsule (50 mg total) by mouth 2 (two) times daily. 60 capsule 0  . oxyCODONE (OXY IR/ROXICODONE) 5 MG immediate release tablet Take 1-2 tablets (5-10 mg total) by mouth every 6 (six) hours as needed for moderate pain, severe pain or breakthrough pain. 60 tablet 0  . Rivaroxaban 15 & 20 MG TBPK Take 15 mg by mouth 2 (two) times daily. Take as  directed on package: Start with one 15mg  tablet by mouth twice a day with food. On Day 22, switch to one 20mg  tablet once a day with food. 51 each 0  . tiZANidine (ZANAFLEX) 4 MG tablet Take 1 tablet (4 mg total) by mouth every 8 (eight) hours as needed for muscle spasms. 60 tablet 0   No current facility-administered medications for this visit.     REVIEW OF SYSTEMS:    10 Point review of Systems was done is negative except as noted above.  PHYSICAL EXAMINATION: ECOG PERFORMANCE STATUS: 2 - Symptomatic, <50% confined to bed  . Vitals:   12/30/16  1120  BP: (!) 145/80  Pulse: 66  Resp: 17  Temp: 98.4 F (36.9 C)  SpO2: 100%   Filed Weights   12/30/16 1120  Weight: 295 lb 9.6 oz (134.1 kg)   .Body mass index is 40.09 kg/m.  GENERAL:alert, in no acute distress and comfortable SKIN: no acute rashes, no significant lesions. Bad hidradenitis suppurativa with open skin wounds to left axilla. Few lipomas on BUE and chest wall.  EYES: conjunctiva are pink and non-injected, sclera anicteric OROPHARYNX: MMM, no exudates, no oropharyngeal erythema or ulceration. No mouth sores.  NECK: supple, no JVD LYMPH:  no palpable lymphadenopathy in the cervical, axillary or inguinal regions LUNGS: clear to auscultation b/l with normal respiratory effort HEART: regular rate & rhythm ABDOMEN:  normoactive bowel sounds , non tender, not distended. Extremity: bilateral 2+ pitting pedal edema PSYCH: alert & oriented x 3 with fluent speech NEURO: no focal motor/sensory deficits  LABORATORY DATA:  I have reviewed the data as listed  . CBC Latest Ref Rng & Units 12/30/2016 12/02/2016 12/01/2016  WBC 4.0 - 10.3 10e3/uL 8.2 12.6(H) 15.2(H)  Hemoglobin 13.0 - 17.1 g/dL 10.4(L) 7.9(L) 8.2(L)  Hematocrit 38.4 - 49.9 % 33.2(L) 24.9(L) 25.8(L)  Platelets 140 - 400 10e3/uL 289 245 264   . CBC    Component Value Date/Time   WBC 8.2 12/30/2016 1228   WBC 12.6 (H) 12/02/2016 0429   RBC 3.56 (L)  12/30/2016 1228   RBC 2.72 (L) 12/02/2016 0429   HGB 10.4 (L) 12/30/2016 1228   HCT 33.2 (L) 12/30/2016 1228   PLT 289 12/30/2016 1228   MCV 93.3 12/30/2016 1228   MCH 29.2 12/30/2016 1228   MCH 29.0 12/02/2016 0429   MCHC 31.3 (L) 12/30/2016 1228   MCHC 31.7 12/02/2016 0429   RDW 15.1 (H) 12/30/2016 1228   LYMPHSABS 2.3 12/30/2016 1228   MONOABS 0.6 12/30/2016 1228   EOSABS 0.2 12/30/2016 1228   BASOSABS 0.0 12/30/2016 1228    . CMP Latest Ref Rng & Units 12/30/2016 12/02/2016 12/01/2016  Glucose 70 - 140 mg/dl 105 127(H) 153(H)  BUN 7.0 - 26.0 mg/dL 13.9 24(H) 24(H)  Creatinine 0.7 - 1.3 mg/dL 1.6(H) 1.46(H) 1.83(H)  Sodium 136 - 145 mEq/L 140 139 139  Potassium 3.5 - 5.1 mEq/L 3.9 4.2 4.5  Chloride 101 - 111 mmol/L - 107 106  CO2 22 - 29 mEq/L 24 24 26   Calcium 8.4 - 10.4 mg/dL 10.1 8.1(L) 8.8(L)  Total Protein 6.4 - 8.3 g/dL 9.2(H) - -  Total Bilirubin 0.20 - 1.20 mg/dL 0.33 - -  Alkaline Phos 40 - 150 U/L 107 - -  AST 5 - 34 U/L 11 - -  ALT 0-55 U/L U/L <6 - -   Component     Latest Ref Rng & Units 12/30/2016  Iron     42 - 163 ug/dL 23 (L)  TIBC     202 - 409 ug/dL 365  UIBC     117 - 376 ug/dL 342  %SAT     20 - 55 % 6 (L)  Ferritin     22 - 316 ng/ml 51  LDH     125 - 245 U/L 255 (H)     RADIOGRAPHIC STUDIES: I have personally reviewed the radiological images as listed and agreed with the findings in the report. No results found.  ASSESSMENT & PLAN:   56 y.o. male with recurrent DVT on Xarelto.   1. Recurrent DVT   Patient had his  first DVT in 2013 in [place]. It is unsure if it was only the left lower extremity or bilateral lower extremities. He notes that it was thought to be unprovoked and he was recommended long-term blood thinners. He was initially on Coumadin and then was done in addition to Xarelto. No issues with bleeding dose for. He has gained a significant amount of weight since then and his weight is more than 130 kilograms.  He had  recent left hip surgery for which she was off his anticoagulation for 1 week and was resumed on Xarelto 20 mg daily without a loading dose.  This appears to have caused surgery triggered acute DVT in his left popliteal vein in addition to chronic venous thromboembolism in his left lower extremity.  He has multiple risk factors in addition to his recent trigger from surgery. These include morbid obesity and previous history of blood clot with chronic DVT.  No chest pain or shortness of breath to suggest pulmonary embolism.  PLAN -The new DVT does not necessarily represent failure of his Xarelto. It likely occurred from being on Xarelto for a week prior to surgery without bridging anticoagulation and was triggered by the surgery . -Given his weight dose of 20 mg daily of the Xarelto could be inadequate since he does wait more than 130 kilograms. -However he has chronic kidney disease which could cause potentially cause accumulation on the medication if the GFR drops to below 50 and can increase the risk of bleeding. -For these reasons of uncertain anticoagulation and increased risk of bleeding would recommend transitioning the patient to Coumadin and continued monitoring Coumadin with his primary care physician. -Would recommend transitioning from rivaroxaban to warfarin - to be pursued with primary care physician - stop rivaroxaban, start warfarin the same day, and bridge with a parenteral anticoagulant such as lovenox/fondaparinux  indicated until the desired INR is reached and for atleastt 5 days. -In the absence of alternative information will continue the previous recommendation for long-term anticoagulation. -Patient has an indwelling IVC filter as per his report.  2. Anemia - Iron deficiency. Likely from chronic oozing from his hydradenitis suppurativa and or bilateral armpits and in the groin with open wounds and also could blood loss anemia from his recent left hip surgery. His hemoglobin  was 7.9 on 12/02/2016 and he received 1 unit of PRBC. Hemoglobin has improved to 10.4. No associated leukopenia or thrombocytopenia. Ferritin level is low at 51 considering chronic inflammation from HS. Iron saturation of 6%. Plan -Will await remaining workup for the anemia. -Recommend iron polysaccharide heart 50 mg by mouth twice a day to target a ferritin of more than 100 and iron saturation of more than 20%.  - repeat labs with primary care physician and 2 months and adjust total and accordingly.  3. HIV/AIDS - continue management and follow-up with Dr. [name] .   RTC with Dr. Irene Limbo PRN.   All of the patients questions were answered with apparent satisfaction. The patient knows to call the clinic with any problems, questions or concerns.  I spent 45 minutes counseling the patient face to face. The total time spent in the appointment was 60 minutes and more than 50% was on counseling and direct patient cares.    Sullivan Lone MD [place] AAHIVMS [facility] Hematology/Oncology Physician [facility]  (Office):       [phone] (Work cell):  [phone] (Fax):           [phone]  12/30/2016 11:23 AM  This document serves as a record of services personally performed by Sullivan Lone, MD. It was created on her behalf by Steva Colder, a trained medical scribe. The creation of this record is based on the scribe's personal observations and the provider's statements to them. This document has been checked and approved by the attending provider.

## 2016-12-30 ENCOUNTER — Ambulatory Visit (HOSPITAL_BASED_OUTPATIENT_CLINIC_OR_DEPARTMENT_OTHER): Payer: Medicaid Other | Admitting: Hematology

## 2016-12-30 ENCOUNTER — Ambulatory Visit (HOSPITAL_BASED_OUTPATIENT_CLINIC_OR_DEPARTMENT_OTHER): Payer: Medicaid Other

## 2016-12-30 ENCOUNTER — Encounter: Payer: Self-pay | Admitting: Hematology

## 2016-12-30 VITALS — BP 145/80 | HR 66 | Temp 98.4°F | Resp 17 | Ht 72.0 in | Wt 295.6 lb

## 2016-12-30 DIAGNOSIS — D509 Iron deficiency anemia, unspecified: Secondary | ICD-10-CM

## 2016-12-30 DIAGNOSIS — I82519 Chronic embolism and thrombosis of unspecified femoral vein: Secondary | ICD-10-CM

## 2016-12-30 DIAGNOSIS — B20 Human immunodeficiency virus [HIV] disease: Secondary | ICD-10-CM

## 2016-12-30 DIAGNOSIS — I82432 Acute embolism and thrombosis of left popliteal vein: Secondary | ICD-10-CM | POA: Diagnosis not present

## 2016-12-30 DIAGNOSIS — L732 Hidradenitis suppurativa: Secondary | ICD-10-CM | POA: Diagnosis not present

## 2016-12-30 DIAGNOSIS — N183 Chronic kidney disease, stage 3 (moderate): Secondary | ICD-10-CM

## 2016-12-30 DIAGNOSIS — D649 Anemia, unspecified: Secondary | ICD-10-CM

## 2016-12-30 DIAGNOSIS — I824Z2 Acute embolism and thrombosis of unspecified deep veins of left distal lower extremity: Secondary | ICD-10-CM

## 2016-12-30 LAB — IRON AND TIBC
%SAT: 6 % — AB (ref 20–55)
Iron: 23 ug/dL — ABNORMAL LOW (ref 42–163)
TIBC: 365 ug/dL (ref 202–409)
UIBC: 342 ug/dL (ref 117–376)

## 2016-12-30 LAB — CBC & DIFF AND RETIC
BASO%: 0.2 % (ref 0.0–2.0)
BASOS ABS: 0 10*3/uL (ref 0.0–0.1)
EOS ABS: 0.2 10*3/uL (ref 0.0–0.5)
EOS%: 2 % (ref 0.0–7.0)
HCT: 33.2 % — ABNORMAL LOW (ref 38.4–49.9)
HGB: 10.4 g/dL — ABNORMAL LOW (ref 13.0–17.1)
Immature Retic Fract: 11.9 % — ABNORMAL HIGH (ref 3.00–10.60)
LYMPH#: 2.3 10*3/uL (ref 0.9–3.3)
LYMPH%: 28.3 % (ref 14.0–49.0)
MCH: 29.2 pg (ref 27.2–33.4)
MCHC: 31.3 g/dL — AB (ref 32.0–36.0)
MCV: 93.3 fL (ref 79.3–98.0)
MONO#: 0.6 10*3/uL (ref 0.1–0.9)
MONO%: 7.1 % (ref 0.0–14.0)
NEUT%: 62.4 % (ref 39.0–75.0)
NEUTROS ABS: 5.1 10*3/uL (ref 1.5–6.5)
Platelets: 289 10*3/uL (ref 140–400)
RBC: 3.56 10*6/uL — AB (ref 4.20–5.82)
RDW: 15.1 % — ABNORMAL HIGH (ref 11.0–14.6)
RETIC %: 1.91 % — AB (ref 0.80–1.80)
RETIC CT ABS: 68 10*3/uL (ref 34.80–93.90)
WBC: 8.2 10*3/uL (ref 4.0–10.3)

## 2016-12-30 LAB — COMPREHENSIVE METABOLIC PANEL
AST: 11 U/L (ref 5–34)
Albumin: 3.5 g/dL (ref 3.5–5.0)
Alkaline Phosphatase: 107 U/L (ref 40–150)
Anion Gap: 10 mEq/L (ref 3–11)
BUN: 13.9 mg/dL (ref 7.0–26.0)
CALCIUM: 10.1 mg/dL (ref 8.4–10.4)
CHLORIDE: 106 meq/L (ref 98–109)
CO2: 24 meq/L (ref 22–29)
CREATININE: 1.6 mg/dL — AB (ref 0.7–1.3)
EGFR: 56 mL/min/{1.73_m2} — ABNORMAL LOW (ref >90)
Glucose: 105 mg/dl (ref 70–140)
Potassium: 3.9 mEq/L (ref 3.5–5.1)
SODIUM: 140 meq/L (ref 136–145)
Total Bilirubin: 0.33 mg/dL (ref 0.20–1.20)
Total Protein: 9.2 g/dL — ABNORMAL HIGH (ref 6.4–8.3)

## 2016-12-30 LAB — LACTATE DEHYDROGENASE: LDH: 255 U/L — AB (ref 125–245)

## 2016-12-30 LAB — FERRITIN: FERRITIN: 51 ng/mL (ref 22–316)

## 2016-12-31 ENCOUNTER — Ambulatory Visit: Payer: Medicaid Other | Admitting: Hematology and Oncology

## 2016-12-31 LAB — SEDIMENTATION RATE: SED RATE: 83 mm/h — AB (ref 0–30)

## 2016-12-31 LAB — HAPTOGLOBIN: HAPTOGLOBIN: 267 mg/dL — AB (ref 34–200)

## 2016-12-31 LAB — VITAMIN B12: VITAMIN B 12: 369 pg/mL (ref 232–1245)

## 2016-12-31 LAB — FOLATE RBC
FOLATE, RBC: 975 ng/mL (ref >498)
Folate, Hemolysate: 321.9 ng/mL
Hematocrit: 33 % — ABNORMAL LOW (ref 37.5–51.0)

## 2016-12-31 LAB — KAPPA/LAMBDA LIGHT CHAINS
Ig Kappa Free Light Chain: 40.9 mg/L — ABNORMAL HIGH (ref 3.3–19.4)
Ig Lambda Free Light Chain: 30.9 mg/L — ABNORMAL HIGH (ref 5.7–26.3)
Kappa/Lambda FluidC Ratio: 1.32 (ref 0.26–1.65)

## 2017-01-01 LAB — MULTIPLE MYELOMA PANEL, SERUM
ALBUMIN SERPL ELPH-MCNC: 3.4 g/dL (ref 2.9–4.4)
Albumin/Glob SerPl: 0.7 (ref 0.7–1.7)
Alpha 1: 0.3 g/dL (ref 0.0–0.4)
Alpha2 Glob SerPl Elph-Mcnc: 0.8 g/dL (ref 0.4–1.0)
B-GLOBULIN SERPL ELPH-MCNC: 1.8 g/dL — AB (ref 0.7–1.3)
Gamma Glob SerPl Elph-Mcnc: 2 g/dL — ABNORMAL HIGH (ref 0.4–1.8)
Globulin, Total: 5 g/dL — ABNORMAL HIGH (ref 2.2–3.9)
IGM (IMMUNOGLOBIN M), SRM: 105 mg/dL (ref 20–172)
IgA, Qn, Serum: 701 mg/dL — ABNORMAL HIGH (ref 90–386)
TOTAL PROTEIN: 8.4 g/dL (ref 6.0–8.5)

## 2017-01-08 ENCOUNTER — Ambulatory Visit (INDEPENDENT_AMBULATORY_CARE_PROVIDER_SITE_OTHER): Payer: Medicaid Other | Admitting: Orthopaedic Surgery

## 2017-01-10 ENCOUNTER — Other Ambulatory Visit (HOSPITAL_COMMUNITY): Payer: Self-pay

## 2017-01-13 ENCOUNTER — Ambulatory Visit (HOSPITAL_COMMUNITY)
Admission: RE | Admit: 2017-01-13 | Discharge: 2017-01-13 | Disposition: A | Discharge status: Home / Self Care | Payer: Medicaid Other | Source: Ambulatory Visit | Attending: Nephrology | Admitting: Nephrology

## 2017-01-13 DIAGNOSIS — D631 Anemia in chronic kidney disease: Secondary | ICD-10-CM | POA: Diagnosis present

## 2017-01-13 DIAGNOSIS — N189 Chronic kidney disease, unspecified: Secondary | ICD-10-CM | POA: Insufficient documentation

## 2017-01-13 MED ORDER — SODIUM CHLORIDE 0.9 % IV SOLN
510.0000 mg | INTRAVENOUS | Status: DC
  Administered 2017-01-13: 10:00:00 510 mg via INTRAVENOUS
  Filled 2017-01-13: qty 17

## 2017-01-13 NOTE — Discharge Instructions (Signed)

## 2017-01-15 ENCOUNTER — Ambulatory Visit (INDEPENDENT_AMBULATORY_CARE_PROVIDER_SITE_OTHER): Payer: Medicaid Other | Admitting: Physician Assistant

## 2017-01-15 DIAGNOSIS — Z96642 Presence of left artificial hip joint: Secondary | ICD-10-CM

## 2017-01-15 MED ORDER — OXYCODONE HCL 5 MG PO TABS: 5.0000 mg | ORAL_TABLET | Freq: Four times a day (QID) | ORAL | 0 refills | Status: DC | PRN

## 2017-01-15 NOTE — Progress Notes (Signed)
[name] returns today follow-up of his left total hip arthroplasty. He is now 47 days status post left total hip arthroplasty anterior approach. He is overall doing well. Is requesting a refill on his oxycodone.She had no chest pain no fevers chills. He has chronic shortness of breath with activity.  Physical exam: Left hip surgical incisions healing well no signs of infection good range of motion with internal and external rotation. Left calf supple nontender.  Impression: Status post left total hip arthroplasty 11/29/2016  Plan: Refill on his oxycodone was given today. He'll work on scar tissue mobilization of the surgical incision. Continue to work on range of motion showing pain in the hip. Follow up with [place] next August at that time we'll obtain an AP pelvis and lateral view of the left hip. He'll return sooner if he has any questions or concerns.

## 2017-01-20 ENCOUNTER — Ambulatory Visit (HOSPITAL_COMMUNITY)
Admission: RE | Admit: 2017-01-20 | Discharge: 2017-01-20 | Disposition: A | Discharge status: Home / Self Care | Payer: Medicaid Other | Source: Ambulatory Visit | Attending: Nephrology | Admitting: Nephrology

## 2017-01-20 DIAGNOSIS — N189 Chronic kidney disease, unspecified: Secondary | ICD-10-CM | POA: Insufficient documentation

## 2017-01-20 DIAGNOSIS — D631 Anemia in chronic kidney disease: Secondary | ICD-10-CM | POA: Diagnosis present

## 2017-01-20 MED ORDER — SODIUM CHLORIDE 0.9 % IV SOLN
510.0000 mg | INTRAVENOUS | Status: AC
  Administered 2017-01-20: 510 mg via INTRAVENOUS
  Filled 2017-01-20: qty 17

## 2017-01-24 ENCOUNTER — Encounter: Payer: Self-pay | Admitting: Internal Medicine

## 2017-01-24 ENCOUNTER — Ambulatory Visit (INDEPENDENT_AMBULATORY_CARE_PROVIDER_SITE_OTHER): Payer: Medicaid Other | Admitting: Internal Medicine

## 2017-01-24 ENCOUNTER — Ambulatory Visit: Payer: Medicaid Other | Admitting: Pharmacist

## 2017-01-24 VITALS — BP 136/70 | HR 60 | Temp 98.6°F | Wt 297.8 lb

## 2017-01-24 DIAGNOSIS — Z96642 Presence of left artificial hip joint: Secondary | ICD-10-CM

## 2017-01-24 DIAGNOSIS — Z8739 Personal history of other diseases of the musculoskeletal system and connective tissue: Secondary | ICD-10-CM | POA: Diagnosis not present

## 2017-01-24 DIAGNOSIS — I82532 Chronic embolism and thrombosis of left popliteal vein: Secondary | ICD-10-CM

## 2017-01-24 DIAGNOSIS — I82409 Acute embolism and thrombosis of unspecified deep veins of unspecified lower extremity: Secondary | ICD-10-CM

## 2017-01-24 DIAGNOSIS — Z7901 Long term (current) use of anticoagulants: Secondary | ICD-10-CM | POA: Diagnosis not present

## 2017-01-24 DIAGNOSIS — L732 Hidradenitis suppurativa: Secondary | ICD-10-CM

## 2017-01-24 DIAGNOSIS — E669 Obesity, unspecified: Secondary | ICD-10-CM | POA: Diagnosis not present

## 2017-01-24 DIAGNOSIS — Z6841 Body Mass Index (BMI) 40.0 and over, adult: Secondary | ICD-10-CM | POA: Diagnosis not present

## 2017-01-24 DIAGNOSIS — N183 Chronic kidney disease, stage 3 (moderate): Secondary | ICD-10-CM

## 2017-01-24 DIAGNOSIS — Z1211 Encounter for screening for malignant neoplasm of colon: Secondary | ICD-10-CM

## 2017-01-24 DIAGNOSIS — Z23 Encounter for immunization: Secondary | ICD-10-CM

## 2017-01-24 MED ORDER — ENOXAPARIN SODIUM 150 MG/ML ~~LOC~~ SOLN: 150.0000 mg | Freq: Two times a day (BID) | SUBCUTANEOUS | 0 refills | Status: DC

## 2017-01-24 MED ORDER — WARFARIN SODIUM 5 MG PO TABS: 5.0000 mg | ORAL_TABLET | Freq: Every day | ORAL | 3 refills | Status: DC

## 2017-01-24 NOTE — Patient Instructions (Addendum)
Stop Xarelto (rivaroxaban) Start enoxaparin (Lovenox) 150 mg twice daily, first dose this evening 01/24/17 Start warfarin (Coumadin) 5 mg once daily, first dose this evening 01/24/17 Appointment in [facility] on 01/27/17 at 9:15 am  Please follow up with your hematologist (Dr. Irene Limbo) regarding your blood tests.

## 2017-01-24 NOTE — Progress Notes (Signed)
Co-visit VTE treatment, initiated overlap therapy with warfarin/enoxaparin. RTC 3 days.

## 2017-01-25 NOTE — Assessment & Plan Note (Addendum)
Patient is s/p L hip replacement secondary to avascular necrosis 8/31.  He is Xarelto was stopped 1 week prior to surgery and restarted on postop day 1. Patient states that he was doing well until about 1 month ago when he was seen in the ED on 9/21 with left lower extremity swelling and was found to have an acute L poplietal DVT while on Xarelto 20mg  QD.  His Xarelto dose was changed to 15 mg twice daily for 3 weeks and then 20 mg daily and he was referred to hematology. Dr. Irene Limbo with hematology recommended switching Xarelto to warfarin given inappropriate dose for patient's weight and history of chronic kidney disease stage III.  Patient states he has been on Coumadin before and would like to stay on Xarelto but is agreeable to switch to warfarin.  Our pharmacist, Dr. Maudie Mercury, educated patient on Coumadin and how to self administer the Lovenox injections.  - Stop Xarelto today - Start SQ Lovenox 150 mg twice daily today - Start warfarin 5 mg daily today - Follow-up in [facility] on10/29 - Patient advised to follow-up with hematologist regarding additional blood tests - Follow-up in 6 months or sooner if needed

## 2017-01-25 NOTE — Progress Notes (Signed)
   CC: Follow up of recurrent DVTs and hidradenitis suppurativa   HPI:  [name] is a 56 y.o. male with history as described below who presents to clinic for follow up of recurrent DVTs and hidradenitis suppurativa. please see problem based assessment and plan for further details.  Past Medical History:  Diagnosis Date  . Abscess   . Arthritis    all over  . Avascular necrosis of femoral head, left ([place])   . Cholelithiasis   . CKD (chronic kidney disease) stage 3, GFR 30-59 ml/min (HCC)    sees kidney Dr.  . DVT (deep venous thrombosis) (HCC)    legs  . Dyspnea    when walking  . Dysrhythmia    remembers mother taking about having an irregular rhythm years when he was a child   . GERD (gastroesophageal reflux disease)    takes Pepcid  . HIV (human immunodeficiency virus infection) ([place])   . Morbid obesity ([place])    Review of Systems:   Review of Systems  Constitutional: Negative for chills, diaphoresis, fever, malaise/fatigue and weight loss.  Respiratory: Negative for cough and shortness of breath.   Cardiovascular: Positive for leg swelling. Negative for chest pain and palpitations.  Gastrointestinal: Negative for abdominal pain, blood in stool, constipation, diarrhea, melena, nausea and vomiting.  Musculoskeletal: Positive for joint pain and myalgias. Negative for falls.  Skin: Positive for rash.  Neurological: Negative for dizziness, weakness and headaches.  All other systems reviewed and are negative.    Physical Exam:  Vitals:   01/24/17 1406  BP: 136/70  Pulse: 60  Temp: 98.6 F (37 C)  TempSrc: Oral  SpO2: 99%  Weight: 297 lb 12.8 oz (135.1 kg)   General: Very pleasant male, obese, well-developed, in no acute distress Cardiac: regular rate and rhythm, nl S1/S2, no murmurs, rubs or gallops  Pulm: CTAB, no wheezes or crackles, no increased work of breathing  Abd: soft, NTND, bowel sounds present Neuro: A&Ox3, able to move all 4 extremities, no  focal deficits noted Ext: warm and well perfused, there is 1+ pitting edema of left lower extremity that is chronic in nature Derm: there are 4 small lesions in the right scrotum that are draining  serosanguineous discharge.  There is also one lesion in the left groin that is draining serosanguineous discharge as well.  Lesions are not associated with erythema or warmth and no purulent discharge noted.      Assessment & Plan:   See Encounters Tab for problem based charting.  Patient seen with Dr. Dareen Piano

## 2017-01-27 ENCOUNTER — Ambulatory Visit (INDEPENDENT_AMBULATORY_CARE_PROVIDER_SITE_OTHER): Payer: Medicaid Other | Admitting: Pharmacist

## 2017-01-27 DIAGNOSIS — Z7901 Long term (current) use of anticoagulants: Secondary | ICD-10-CM

## 2017-01-27 DIAGNOSIS — I825Z2 Chronic embolism and thrombosis of unspecified deep veins of left distal lower extremity: Secondary | ICD-10-CM

## 2017-01-27 DIAGNOSIS — I82409 Acute embolism and thrombosis of unspecified deep veins of unspecified lower extremity: Secondary | ICD-10-CM

## 2017-01-27 LAB — POCT INR: INR: 1

## 2017-01-27 NOTE — Progress Notes (Signed)
Anticoagulation Management Allen Espinoza is a 56 y.o. male who reports to the clinic for monitoring of warfarin treatment.    Indication: DVT   Duration: indefinite Supervising physician: Oval Linsey  Anticoagulation Clinic Visit History: Patient does not report signs/symptoms of bleeding or thromboembolism  Other recent changes: No diet, medications, lifestyle changes endorsed by the patient to me.  Anticoagulation Episode Summary    Current INR goal:   2.0-3.0  TTR:   -  Next INR check:   01/30/2017  INR from last check:   1.0! 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 Sulfa Antibiotics Other (See Comments)    Low blood pressure  . Vancomycin Itching    Give with benadryl   Prior to Admission medications   Medication Sig Start Date End Date Taking? Authorizing Provider  darunavir (PREZISTA) 800 MG tablet TAKE 1 TABLET BY MOUTH ONCE DAILY WITH BREAKFAST 11/01/16  Yes Sid Falcon, MD  elvitegravir-cobicistat-emtricitabine-tenofovir (GENVOYA) 150-150-200-10 MG TABS tablet TAKE ONE TABLET BY MOUTH ONCE DAILY WITH BREAKFAST 11/01/16  Yes Sid Falcon, MD  enoxaparin (LOVENOX) 150 MG/ML injection Inject 1 mL (150 mg total) into the skin every 12 (twelve) hours. Start in the evening on 01-24-17 01/24/17  Yes Santos-Sanchez, Merlene Morse, MD  minocycline (MINOCIN,DYNACIN) 50 MG capsule Take 1 capsule (50 mg total) by mouth 2 (two) times daily. 12/11/16  Yes [name], MD  oxyCODONE (OXY IR/ROXICODONE) 5 MG immediate release tablet Take 1-2 tablets (5-10 mg total) by mouth every 6 (six) hours as needed for moderate pain, severe pain or breakthrough  pain. 01/15/17  Yes [name] Pelt, PA-C  tiZANidine (ZANAFLEX) 4 MG tablet Take 1 tablet (4 mg total) by mouth every 8 (eight) hours as needed for muscle spasms. 11/30/16  Yes [name], MD  warfarin (COUMADIN) 5 MG tablet Take 1 tablet (5 mg total) by mouth daily. Start in the evening on 01-24-17 01/24/17  Yes Welford Roche, MD   Past Medical History:  Diagnosis Date  . Abscess   . Arthritis    all over  . Avascular necrosis of femoral head, left ([place])   . Cholelithiasis   . CKD (chronic kidney disease) stage 3, GFR 30-59 ml/min (HCC)    sees kidney Dr.  . DVT (deep venous thrombosis) (HCC)    legs  . Dyspnea    when walking  . Dysrhythmia    remembers mother taking about having an irregular rhythm years when he was a child   . GERD (gastroesophageal reflux disease)    takes Pepcid  . HIV (human immunodeficiency virus infection) ([place])   . Morbid obesity ([place])    Social History   Social History  . Marital status: Single    Spouse name: N/A  . Number of children: N/A  . Years of education: N/A   Social History Main Topics  . Smoking status: Never Smoker  . Smokeless tobacco: Never Used  . Alcohol use No     Comment: prior  . Drug use: No  Comment: prior cocaine use, last 2005  . Sexual activity: Yes    Partners: Female    Birth control/ protection: Condom   Other Topics Concern  . Not on file   Social History Narrative  . No narrative on file   Family History  Problem Relation Age of Onset  . Heart attack Mother   . Heart attack Father     ASSESSMENT Recent Results: The most recent result is correlated with 35 mg per week--but only took ONE dose this past Friday after seeing his PCP he states to me.  Lab Results  Component Value Date   INR 1.0 01/27/2017   INR 1.16 10/14/2016    Anticoagulation Dosing: INR as of 01/27/2017 and Previous Warfarin Dosing Information    INR Dt INR Goal [name] Sun Mon Tue Wed Thu Fri Sat    01/27/2017 1.0 - 25 mg 0 mg 5 mg 5 mg 5 mg 5 mg 5 mg 0 mg   Patient deviated from recommended dosing.       Anticoagulation Warfarin Dose Instructions as of 01/27/2017      Total Sun Mon Tue Wed Thu Fri Sat   New Dose 15 mg 0 mg 5 mg 5 mg 5 mg 0 mg 0 mg 0 mg     -  (5 mg x 1)  (5 mg x 1)  (5 mg x 1)  -  -  -                         Description   Will continue Lovenox 150mg   Administered 2 inches away from your belly-button at level of your waist-band/belt--every 12 hours. Alternate sites (left-right-left, etc.). Do NOT rub the area after an injection.      INR today: Subtherapeutic  PLAN Weekly dose was unchanged. Will continue Lovenox bridge with concomitant Lovenxo + warfarin, re-evaluate INR response on Thursday 1-NOV-18.  Patient Instructions  Patient instructed to take medications as defined in the Anti-coagulation Track section of this encounter.  Patient instructed to take today's dose.  Patient instructed to take one (1) of your peach-colored warfarin tablets by mouth, once-daily at [facility] each day. Patient instructed to CONTINUE your Lovenox injections/shots--inject at level of waist-band/belt-line; inject TWO (2) INCHES AWAY from your belly button (or farther away) every 12 hours.  Patient verbalized understanding of these instructions.     Patient advised to contact clinic or seek medical attention if signs/symptoms of bleeding or thromboembolism occur.  Patient verbalized understanding by repeating back information and was advised to contact me if further medication-related questions arise. Patient was also provided an information handout.  Follow-up Return in 3 days (on 01/30/2017) for Follow up INR at [place] , PharmD, CACP, CPP 45 minutes spent face-to-face with the patient during the encounter. 50% of time spent on education. 50% of time was spent on INR sample collection, processing, results determination and continued reinforcement of requirement of  overlap of warfarin WITH the Lovenox. Marland Kitchen

## 2017-01-27 NOTE — Patient Instructions (Signed)
Patient instructed to take medications as defined in the Anti-coagulation Track section of this encounter.  Patient instructed to take today's dose.  Patient instructed to take one (1) of your peach-colored warfarin tablets by mouth, once-daily at [facility] each day. Patient instructed to CONTINUE your Lovenox injections/shots--inject at level of waist-band/belt-line; inject TWO (2) INCHES AWAY from your belly button (or farther away) every 12 hours.  Patient verbalized understanding of these instructions.

## 2017-01-28 ENCOUNTER — Ambulatory Visit: Payer: Medicaid Other | Admitting: Pharmacist

## 2017-01-28 ENCOUNTER — Encounter: Payer: Self-pay | Admitting: Physician Assistant

## 2017-01-30 ENCOUNTER — Ambulatory Visit (INDEPENDENT_AMBULATORY_CARE_PROVIDER_SITE_OTHER): Payer: Medicaid Other | Admitting: Pharmacist

## 2017-01-30 DIAGNOSIS — Z7901 Long term (current) use of anticoagulants: Secondary | ICD-10-CM

## 2017-01-30 DIAGNOSIS — I825Z2 Chronic embolism and thrombosis of unspecified deep veins of left distal lower extremity: Secondary | ICD-10-CM | POA: Diagnosis not present

## 2017-01-30 DIAGNOSIS — I82409 Acute embolism and thrombosis of unspecified deep veins of unspecified lower extremity: Secondary | ICD-10-CM

## 2017-01-30 LAB — POCT INR: INR: 1.1

## 2017-01-30 NOTE — Progress Notes (Signed)
Anticoagulation Management Allen Espinoza is a 56 y.o. male who reports to the clinic for monitoring of warfarin treatment.    Indication: DVT  Duration: indefinite Supervising physician: Lalla Brothers  Anticoagulation Clinic Visit History: Patient does not report signs/symptoms of bleeding or thromboembolism  Other recent changes: No diet, medications, lifestyle changes endorsed by the patient to me.  Anticoagulation Episode Summary    Current INR goal:   2.0-3.0  TTR:   -  Next INR check:   02/03/2017  INR from last check:   1.10! (01/30/2017)  Weekly max warfarin dose:     Target end date:     INR check location:   [facility]  Preferred lab:     Send INR reminders to:      Indications   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) [I82.5Z2] Recurrent deep vein thrombosis (DVT) (HCC) [I82.409]       Comments:           Allergies  Allergen Reactions  . Sulfa Antibiotics Other (See Comments)    Low blood pressure  . Vancomycin Itching    Give with benadryl   Prior to Admission medications   Medication Sig Start Date End Date Taking? Authorizing Provider  darunavir (PREZISTA) 800 MG tablet TAKE 1 TABLET BY MOUTH ONCE DAILY WITH BREAKFAST 11/01/16  Yes Sid Falcon, MD  elvitegravir-cobicistat-emtricitabine-tenofovir (GENVOYA) 150-150-200-10 MG TABS tablet TAKE ONE TABLET BY MOUTH ONCE DAILY WITH BREAKFAST 11/01/16  Yes Sid Falcon, MD  enoxaparin (LOVENOX) 150 MG/ML injection Inject 1 mL (150 mg total) into the skin every 12 (twelve) hours. Start in the evening on 01-24-17 01/24/17  Yes Santos-Sanchez, Merlene Morse, MD  minocycline (MINOCIN,DYNACIN) 50 MG capsule Take 1 capsule (50 mg total) by mouth 2 (two) times daily. 12/11/16  Yes [name], MD  oxyCODONE (OXY IR/ROXICODONE) 5 MG immediate release tablet Take 1-2 tablets (5-10 mg total) by mouth every 6 (six) hours as needed for moderate pain, severe pain or breakthrough  pain. 01/15/17  Yes [name] Pelt, PA-C  tiZANidine (ZANAFLEX) 4 MG tablet Take 1 tablet (4 mg total) by mouth every 8 (eight) hours as needed for muscle spasms. 11/30/16  Yes [name], MD  warfarin (COUMADIN) 5 MG tablet Take 1 tablet (5 mg total) by mouth daily. Start in the evening on 01-24-17 01/24/17  Yes Welford Roche, MD   Past Medical History:  Diagnosis Date  . Abscess   . Arthritis    all over  . Avascular necrosis of femoral head, left ([place])   . Cholelithiasis   . CKD (chronic kidney disease) stage 3, GFR 30-59 ml/min (HCC)    sees kidney Dr.  . DVT (deep venous thrombosis) (HCC)    legs  . Dyspnea    when walking  . Dysrhythmia    remembers mother taking about having an irregular rhythm years when he was a child   . GERD (gastroesophageal reflux disease)    takes Pepcid  . HIV (human immunodeficiency virus infection) ([place])   . Morbid obesity ([place])    Social History   Social History  . Marital status: Single    Spouse name: N/A  . Number of children: N/A  . Years of education: N/A   Social History Main Topics  . Smoking status: Never Smoker  . Smokeless tobacco: Never Used  . Alcohol use No     Comment: prior  . Drug use: No  Comment: prior cocaine use, last 2005  . Sexual activity: Yes    Partners: Female    Birth control/ protection: Condom   Other Topics Concern  . Not on file   Social History Narrative  . No narrative on file   Family History  Problem Relation Age of Onset  . Heart attack Mother   . Heart attack Father     ASSESSMENT Recent Results: The most recent result is correlated with 3 days of warfarin 5mg  with concomitant enoxaparin/Lovnox 1mg /kg sq q12h. Will CONTINUE the overlap.   Lab Results  Component Value Date   INR 1.10 01/30/2017   INR 1.0 01/27/2017   INR 1.16 10/14/2016    Anticoagulation Dosing: INR as of 01/30/2017 and Previous Warfarin Dosing Information    INR Dt INR Goal Allen Espinoza Sun  Mon Tue Wed Thu Fri Sat   01/30/2017 1.10 2.0-3.0 15 mg 0 mg 5 mg 5 mg 5 mg 0 mg 0 mg 0 mg    Previous description   Will continue Lovenox 150mg   Administered 2 inches away from your belly-button at level of your waist-band/belt--every 12 hours. Alternate sites (left-right-left, etc.). Do NOT rub the area after an injection.    Anticoagulation Warfarin Dose Instructions as of 01/30/2017      Total Sun Mon Tue Wed Thu Fri Sat   New Dose 52.5 mg 7.5 mg 5 mg 5 mg 5 mg 10 mg 10 mg 10 mg     (5 mg x 1.5)  (5 mg x 1)  (5 mg x 1)  (5 mg x 1)  (5 mg x 2)  (5 mg x 2)  (5 mg x 2)                         Description   Will continue Lovenox 150mg   Administered 2 inches away from your belly-button at level of your waist-band/belt--every 12 hours. Alternate sites (left-right-left, etc.). Do NOT rub the area after an injection. Commencing today, 1-NOV-18, take 2 of your 5mg  peach-colored warfarin tablets by mouth for three days. On Sunday 4-NOV-18, take only 1&1/2 tablets. Return to clinic on Monday 5-NOV-18 at 9:30AM.      INR today: Subtherapeutic  PLAN Weekly dose was increased to 10mg  qd x 3 days; 7.5mg  x 1 day; then evaluate INR. (Will continue Lovenox during this time).  Patient Instructions  Patient instructed to take medications as defined in the Anti-coagulation Track section of this encounter.  Patient instructed to take today's dose.  Patient instructed to  continue Lovenox 150mg   Administered 2 inches away from your belly-button at level of your waist-band/belt--every 12 hours. Alternate sites (left-right-left, etc.). Do NOT rub the area after an injection. Commencing today, 1-NOV-18, take 2 of your 5mg  peach-colored warfarin tablets by mouth for three days. On Sunday 4-NOV-18, take only 1&1/2 tablets. Return to clinic on Monday 5-NOV-18 at 9:30AM.  Patient verbalized understanding of these instructions.     Patient advised to contact clinic or seek medical attention if signs/symptoms  of bleeding or thromboembolism occur.  Patient verbalized understanding by repeating back information and was advised to contact me if further medication-related questions arise. Patient was also provided an information handout.  Follow-up Return in 4 days (on 02/03/2017) for Follow up INR at 0930h.  Pennie Banter, PharmD, CACP, CPP  15 minutes spent face-to-face with the patient during the encounter. 50% of time spent on education. 50% of time was spent on  fingerstick INR sample collection, processing, results determination, dose adjustment and documentation in [email or web address].

## 2017-01-30 NOTE — Patient Instructions (Signed)
Patient instructed to take medications as defined in the Anti-coagulation Track section of this encounter.  Patient instructed to take today's dose.  Patient instructed to  continue Lovenox 150mg   Administered 2 inches away from your belly-button at level of your waist-band/belt--every 12 hours. Alternate sites (left-right-left, etc.). Do NOT rub the area after an injection. Commencing today, 1-NOV-18, take 2 of your 5mg  peach-colored warfarin tablets by mouth for three days. On Sunday 4-NOV-18, take only 1&1/2 tablets. Return to clinic on Monday 5-NOV-18 at 9:30AM.  Patient verbalized understanding of these instructions.

## 2017-01-31 NOTE — Progress Notes (Signed)
INTERNAL MEDICINE TEACHING ATTENDING ADDENDUM ° °I agree with pharmacy recommendations as outlined in their note.  ° °-Timica Marcom MD ° °

## 2017-02-02 NOTE — Progress Notes (Signed)
Internal Medicine Clinic Attending  I saw and evaluated the patient.  I personally confirmed the key portions of the history and exam documented by Dr. Santos-Sanchez and I reviewed pertinent patient test results.  The assessment, diagnosis, and plan were formulated together and I agree with the documentation in the resident's note. 

## 2017-02-03 ENCOUNTER — Ambulatory Visit (INDEPENDENT_AMBULATORY_CARE_PROVIDER_SITE_OTHER): Payer: Medicaid Other | Admitting: Pharmacist

## 2017-02-03 DIAGNOSIS — Z7901 Long term (current) use of anticoagulants: Secondary | ICD-10-CM

## 2017-02-03 DIAGNOSIS — I82409 Acute embolism and thrombosis of unspecified deep veins of unspecified lower extremity: Secondary | ICD-10-CM

## 2017-02-03 DIAGNOSIS — Z23 Encounter for immunization: Secondary | ICD-10-CM | POA: Insufficient documentation

## 2017-02-03 DIAGNOSIS — I825Z2 Chronic embolism and thrombosis of unspecified deep veins of left distal lower extremity: Secondary | ICD-10-CM | POA: Diagnosis present

## 2017-02-03 DIAGNOSIS — Z1211 Encounter for screening for malignant neoplasm of colon: Secondary | ICD-10-CM | POA: Insufficient documentation

## 2017-02-03 LAB — PROTIME-INR
INR: 1.76
Prothrombin Time: 20.4 seconds — ABNORMAL HIGH (ref 11.4–15.2)

## 2017-02-03 NOTE — Progress Notes (Signed)
Anticoagulation Management Allen Espinoza is a 56 y.o. male who reports to the clinic for monitoring of warfarin treatment.    Indication: DVT  Duration: indefinite Supervising physician: Gilles Chiquito  Anticoagulation Clinic Visit History: Patient does report signs/symptoms of bleeding or thromboembolism  as noted in patient findings.  Other recent changes: No diet, medications, lifestyle changes endorsed by the patient to me.  Anticoagulation Episode Summary    Current INR goal:   2.0-3.0  TTR:   -  Next INR check:   02/10/2017  INR from last check:   1.76! 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 Sulfa Antibiotics Other (See Comments)    Low blood pressure  . Vancomycin Itching    Give with benadryl   Prior to Admission medications   Medication Sig Start Date End Date Taking? Authorizing Provider  darunavir (PREZISTA) 800 MG tablet TAKE 1 TABLET BY MOUTH ONCE DAILY WITH BREAKFAST 11/01/16  Yes Sid Falcon, MD  elvitegravir-cobicistat-emtricitabine-tenofovir (GENVOYA) 150-150-200-10 MG TABS tablet TAKE ONE TABLET BY MOUTH ONCE DAILY WITH BREAKFAST 11/01/16  Yes Sid Falcon, MD  enoxaparin (LOVENOX) 150 MG/ML injection Inject 1 mL (150 mg total) into the skin every 12 (twelve) hours. Start in the evening on 01-24-17 01/24/17  Yes Santos-Sanchez, Merlene Morse, MD  oxyCODONE (OXY IR/ROXICODONE) 5 MG immediate release tablet Take 1-2 tablets (5-10 mg total) by mouth every 6 (six) hours as needed for moderate pain, severe pain or breakthrough pain. 01/15/17  Yes [name] Pelt, PA-C  tiZANidine (ZANAFLEX) 4 MG tablet Take 1 tablet (4 mg total) by mouth every  8 (eight) hours as needed for muscle spasms. 11/30/16  Yes [name], MD  warfarin (COUMADIN) 5 MG tablet Take 1 tablet (5 mg total) by mouth daily. Start in the evening on 01-24-17 01/24/17  Yes Santos-Sanchez, Merlene Morse, MD  minocycline (MINOCIN,DYNACIN) 50 MG capsule Take 1 capsule (50 mg total) by mouth 2 (two) times daily. Patient not taking: Reported on 02/03/2017 12/11/16   [name], MD   Past Medical History:  Diagnosis Date  . Abscess   . Arthritis    all over  . Avascular necrosis of femoral head, left ([place])   . Cholelithiasis   . CKD (chronic kidney disease) stage 3, GFR 30-59 ml/min (HCC)    sees kidney Dr.  . DVT (deep venous thrombosis) (HCC)    legs  . Dyspnea    when walking  . Dysrhythmia    remembers mother taking about having an irregular rhythm years when he was a child   . GERD (gastroesophageal reflux disease)    takes Pepcid  . HIV (human immunodeficiency virus infection) ([place])   . Morbid obesity ([place])    Social History   Socioeconomic History  . Marital status: Single    Spouse name: Not on file  . Number of children: Not on file  . Years of education: Not on file  . Highest education level: Not on file  Social Needs  . Financial resource strain: Not on  file  . Food insecurity - worry: Not on file  . Food insecurity - inability: Not on file  . Transportation needs - medical: Not on file  . Transportation needs - non-medical: Not on file  Occupational History  . Not on file  Tobacco Use  . Smoking status: Never Smoker  . Smokeless tobacco: Never Used  Substance and Sexual Activity  . Alcohol use: No    Comment: prior  . Drug use: No    Comment: prior cocaine use, last 2005  . Sexual activity: Yes    Partners: Female    Birth control/protection: Condom  Other Topics Concern  . Not on file  Social History Narrative  . Not on file   Family History  Problem Relation Age of Onset  . Heart attack Mother   . Heart  attack Father     ASSESSMENT Recent Results: The most recent result is correlated with 10mg  x 3 days; then 7.5mg  x 1 day, then presented to clinic. Has been on continued LMWH overlap therapy in setting of sub-therapeutic INR response. Still subtherapeutic. Planned discussion with hematology regarding potential for recommencing DOAC--since occurrence/recurrence occurred OFF of the DOAC (in one instance, 5 days off; second event was 3 days off DOAC)--not representative of DOAC "failure" but a return to baseline risk OFF of anticoagulation. IF patient were to require another surgery or any requirement for interruption of warfarin--would similarly require a "5-day OFF warfarin", only to be "bridged" with a Factor Xa inhibitor parenterally administered--which would be discontinued 24h before planned surgery. This management strategy can be effected similarly with the oral DOAC. Off of DOAC for 5 or 3 days prior to surgery(ies) returns patients back to their baseline risk for VTE vs. Representing a DOAC failure. Case to be discussed by Dr. Beryle Beams with Dr. Irene Limbo. In interim, still after 10 days of enoxaparin/Lovenox 1mg /kg sq q12 h + warfarin--INR remains subtherapeutic. Will continue concomitant use of LMWH + warfarin until cleared by Hematology to revert back to [place] therapy.  Lab Results  Component Value Date   INR 1.76 02/03/2017   INR 1.10 01/30/2017   INR 1.0 01/27/2017    Anticoagulation Dosing: Description   Will continue Lovenox 150mg   Administered 2 inches away from your belly-button at level of your waist-band/belt--every 12 hours. Alternate sites (left-right-left, etc.). Do NOT rub the area after an injection. Commencing today, 5-NOV-18, take 2 of your 5mg  peach-colored warfarin tablets by mouth--all days of the week--EXCEPT on Sundays and Wednesdays, take only 1 & 1/2 tablets on Sundays and Wednesdays.       INR today: Subtherapeutic  PLAN Weekly dose was increased to  65mg /wk.  Patient Instructions  Patient instructed to take medications as defined in the Anti-coagulation Track section of this encounter.  Patient instructed to take today's dose.  Patient instructed to continue Lovenox 150mg   Administered 2 inches away from your belly-button at level of your waist-band/belt--every 12 hours. Alternate sites (left-right-left, etc.). Do NOT rub the area after an injection. Commencing today, 5-NOV-18, take 2 of your 5mg  peach-colored warfarin tablets by mouth--all days of the week--EXCEPT on Sundays and Wednesdays, take only 1 & 1/2 tablets on Sundays and Wednesdays.  Patient verbalized understanding of these instructions.     Patient advised to contact clinic or seek medical attention if signs/symptoms of bleeding or thromboembolism occur.  Patient verbalized understanding by repeating back information and was advised to contact me if further medication-related questions arise. Patient was also provided an information handout.  Follow-up Return in 7 days (on 02/10/2017) for Follow up INR at 0945.  Pennie Banter, PharmD, CACP, CPP  15 minutes spent face-to-face with the patient during the encounter. 50% of time spent on education. 50% of time was spent on venipuncture and documentation as well as discussion with Attending Physician(s).

## 2017-02-03 NOTE — Assessment & Plan Note (Signed)
Referral for GI for colonoscopy ordered.

## 2017-02-03 NOTE — Patient Instructions (Signed)
Patient instructed to take medications as defined in the Anti-coagulation Track section of this encounter.  Patient instructed to take today's dose.  Patient instructed to continue Lovenox 150mg   Administered 2 inches away from your belly-button at level of your waist-band/belt--every 12 hours. Alternate sites (left-right-left, etc.). Do NOT rub the area after an injection. Commencing today, 5-NOV-18, take 2 of your 5mg  peach-colored warfarin tablets by mouth--all days of the week--EXCEPT on Sundays and Wednesdays, take only 1 & 1/2 tablets on Sundays and Wednesdays.  Patient verbalized understanding of these instructions.

## 2017-02-03 NOTE — Assessment & Plan Note (Signed)
Received Tdap 10/26.

## 2017-02-05 NOTE — Progress Notes (Signed)
I reviewed Dr. Gladstone Pih note.  INR not at goal and coumadin increased.  Discussion between Dr. Darnell Level and Dr. Irene Limbo regarding transition back to [place] will take place in future.

## 2017-02-06 ENCOUNTER — Ambulatory Visit (INDEPENDENT_AMBULATORY_CARE_PROVIDER_SITE_OTHER): Payer: Medicaid Other | Admitting: Pharmacist

## 2017-02-06 DIAGNOSIS — I825Z2 Chronic embolism and thrombosis of unspecified deep veins of left distal lower extremity: Secondary | ICD-10-CM | POA: Diagnosis not present

## 2017-02-06 DIAGNOSIS — I82409 Acute embolism and thrombosis of unspecified deep veins of unspecified lower extremity: Secondary | ICD-10-CM

## 2017-02-06 DIAGNOSIS — Z7901 Long term (current) use of anticoagulants: Secondary | ICD-10-CM

## 2017-02-06 LAB — POCT INR: INR: 2.6

## 2017-02-06 NOTE — Progress Notes (Signed)
Reviewed thx Dr Darnell Level I just spoke w Dr Irene Limbo about this pt on Tues - confirmed - he is not a Xarelto failure - My concern is he is very "hypercoaguabl": DVT 5 days of Xarelto 1st time & 3 days off second DVT. Discussed w Dr Elie Confer - what happens next time he needs surgery? This man needs tight anticoagulation around any procedures.  P.S.  He is not my primary pt.

## 2017-02-06 NOTE — Progress Notes (Signed)
Indication: Recurrent deep venous thrombosis. Duration: Indefinite. INR: Below target. Agree with Dr. Gladstone Pih assessment and plan.

## 2017-02-06 NOTE — Progress Notes (Signed)
CLINICAL PHARMACY ANTICOAGULATION TRANSITION NOTE Allen Espinoza is a 56 y.o. male who is requesting a switch from warfarin to [place].   ASSESSMENT Indication(s): recurrent DVT Duration: indefinite Supervising physician: Murriel Hopper  LLE DVT around 2013, on Xarelto since then. IVC filter in place. Patient is s/p L hip replacement secondary to avascular necrosis 8/31. Xarelto was stopped 1 week prior to surgery without bridging and restarted on postop day 1. On 9/21, patient was found to have recurrent DVT and was switched to warfarin therapy. He reports today requesting a switch back to rivaroxaban.   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) [I82.5Z2] Recurrent deep vein thrombosis (DVT) ([place]) [I82.409]       Comments:          ANTICOAG TODAY: Anticoagulation Summary  As of 02/06/2017   INR goal:   2.0-3.0  TTR:   -  INR used for dosing:   2.6 (02/06/2017)  Warfarin maintenance plan:   No maintenance plan  Next INR check:     Target end date:      Indications   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) [I82.5Z2] Recurrent deep vein thrombosis (DVT) (HCC) [I82.409]        Anticoagulation Episode Summary    IN[facility] check [facility]   [facility]   Preferred lab:      Send INR reminders to:      Comments:        Labs: Component Value Date/Time   AST 11 12/30/2016 1228   ALT <6 12/30/2016 1228   NA 140 12/30/2016 1228   K 3.9 12/30/2016 1228   CL 107  12/02/2016 0429   CO2 24 12/30/2016 1228   GLUCOSE 105 12/30/2016 1228   HGBA1C 5.0 04/27/2016 1030   BUN 13.9 12/30/2016 1228   CREATININE 1.6 (H) 12/30/2016 1228   CALCIUM 10.1 12/30/2016 1228   GFRNONAA 52 (L) 12/02/2016 0429   GFRNONAA 49 (L) 08/07/2016 1004   GFRAA 60 (L) 12/02/2016 0429   GFRAA 57 (L) 08/07/2016 1004   WBC 8.2 12/30/2016 1228   WBC 12.6 (H) 12/02/2016 0429   HGB 10.4 (L) 12/30/2016 1228   HCT 33.2 (L) 12/30/2016 1228   HCT 33.0 (L) 12/30/2016 1228   PLT 289 12/30/2016 1228   PLAN  INR 2.6 today  Stop warfarin and enoxaparin  Start rivaroxaban (Xarelto) 20 mg daily  Closely monitor renal function in stage 3 CKD patient, patient's estimated CrCl is 55 mL/min.  Patient education provided, advised him to contact clinic if any concerns arise.  Patient verbalized understanding by repeat back and was given a handout.  PCP appointment due April 2019  Flossie Dibble Clinical Pharmacist 02/06/2017, 3:26 PM

## 2017-02-06 NOTE — Patient Instructions (Signed)
Patient educated about medication as defined in this encounter and verbalized understanding by repeating back instructions provided.   

## 2017-02-12 ENCOUNTER — Ambulatory Visit (INDEPENDENT_AMBULATORY_CARE_PROVIDER_SITE_OTHER): Payer: Medicaid Other | Admitting: Infectious Diseases

## 2017-02-12 ENCOUNTER — Other Ambulatory Visit: Payer: Self-pay | Admitting: Pharmacist

## 2017-02-12 VITALS — BP 164/84 | HR 76 | Temp 98.0°F | Wt 301.0 lb

## 2017-02-12 DIAGNOSIS — M87052 Idiopathic aseptic necrosis of left femur: Secondary | ICD-10-CM

## 2017-02-12 DIAGNOSIS — I825Z2 Chronic embolism and thrombosis of unspecified deep veins of left distal lower extremity: Secondary | ICD-10-CM | POA: Diagnosis not present

## 2017-02-12 DIAGNOSIS — L732 Hidradenitis suppurativa: Secondary | ICD-10-CM | POA: Diagnosis not present

## 2017-02-12 DIAGNOSIS — Z113 Encounter for screening for infections with a predominantly sexual mode of transmission: Secondary | ICD-10-CM

## 2017-02-12 DIAGNOSIS — B20 Human immunodeficiency virus [HIV] disease: Secondary | ICD-10-CM | POA: Diagnosis not present

## 2017-02-12 DIAGNOSIS — Z79899 Other long term (current) drug therapy: Secondary | ICD-10-CM | POA: Diagnosis not present

## 2017-02-12 MED ORDER — BICTEGRAVIR-EMTRICITAB-TENOFOV 50-200-25 MG PO TABS: 1.0000 | ORAL_TABLET | Freq: Every day | ORAL | 3 refills | Status: DC

## 2017-02-12 NOTE — Progress Notes (Signed)
   Subjective:    Patient ID: Allen Espinoza, male    DOB: Oct 13, 1960, 56 y.o.   MRN: [id number]  HPI 56 yo M with hx of HIV+ (dx 2007 when he was in [place]). Taking genvoya/prezista. He was adm 1-26 to 04-27-16 with chest pain. He was ruled out for ACS.  He also has a hx of hidradenitis suppurative. He has lesions in groin and L axilla. He had L axilla sugery 09-2016.   He had been on xarelto for DVT- this was stopped for his surgery. He was off for 7 days.   He underwent L THR 12-03-2016. He was then seen in ED on 9-21 with LLE swelling and found to have chronic clots and popliteal clot. He was started on coumadin and LMWH. He is now back on Xarelto 20mg  daily.  He has an IVC filter as well.   HIV 1 RNA Quant (copies/mL)  Date Value  11/19/2016 <20 NOT DETECTED  08/07/2016 <20 NOT DETECTED  04/26/2016 30   HIV-1 RNA Viral Load (copies/mL)  Date Value  03/18/2016 47,300   CD4 T Cell Abs (/uL)  Date Value  08/07/2016 160 (L)  08/01/2016 130 (L)  04/26/2016 200 (L)    Review of Systems  Constitutional: Negative for appetite change, chills, fever and unexpected weight change.  Respiratory: Negative for cough and shortness of breath.   Gastrointestinal: Negative for constipation and diarrhea.  Genitourinary: Negative for difficulty urinating.  Skin: Positive for wound.  Neurological: Negative for headaches.  DOE Persistent hidradenitis in groin.  Please see HPI. All other systems reviewed and negative.     Objective:   Physical Exam  Constitutional: He appears well-developed and well-nourished.  HENT:  Mouth/Throat: No oropharyngeal exudate.  Eyes: EOM are normal. Pupils are equal, round, and reactive to light.  Neck: Neck supple.  Cardiovascular: Normal rate, regular rhythm and normal heart sounds.  Pulmonary/Chest: Effort normal and breath sounds normal.  Abdominal: Soft. Bowel sounds are normal. There is no tenderness. There is no rebound.  Musculoskeletal: He exhibits  edema.  Lymphadenopathy:    He has no cervical adenopathy.          Assessment & Plan:

## 2017-02-12 NOTE — Assessment & Plan Note (Signed)
Await further f/u, debridement of his groin lesions.

## 2017-02-12 NOTE — Assessment & Plan Note (Signed)
He is doing well unfortunately cobi will interact with Xarelto.  Also unfortunately, we do not have his resistance profile from his care in [place]. Will change him to biktarvy Have him back in 3-4 months to see if he breaks through.

## 2017-02-12 NOTE — Assessment & Plan Note (Signed)
He has f/u with IM I greatly appreciate their partnering with [place].

## 2017-02-12 NOTE — Assessment & Plan Note (Signed)
Greatly appreciate Dr Trevor Mace f/u.

## 2017-02-14 ENCOUNTER — Encounter: Payer: Self-pay | Admitting: Physician Assistant

## 2017-02-14 ENCOUNTER — Ambulatory Visit (INDEPENDENT_AMBULATORY_CARE_PROVIDER_SITE_OTHER): Payer: Medicaid Other | Admitting: Physician Assistant

## 2017-02-14 VITALS — BP 126/64 | HR 58 | Ht 72.0 in | Wt 300.0 lb

## 2017-02-14 DIAGNOSIS — Z7901 Long term (current) use of anticoagulants: Secondary | ICD-10-CM | POA: Diagnosis not present

## 2017-02-14 DIAGNOSIS — Z1211 Encounter for screening for malignant neoplasm of colon: Secondary | ICD-10-CM | POA: Diagnosis not present

## 2017-02-14 NOTE — Patient Instructions (Signed)
You will be getting a letter to remind you to call for an office visit in February 2019 with either Dr.  Silvano Rusk or Amy Reeseville PA-C.

## 2017-02-14 NOTE — Progress Notes (Addendum)
Subjective:    Patient ID: Allen Espinoza, male    DOB: 1960/04/04, 56 y.o.   MRN: [id number]  Allen Espinoza is a pleasant 56 year old African-American male, new to GI today referred by Dr. Carolanne Grumbling internal medicine for screening colonoscopy. Patient has not had any prior GI evaluation. He has history of HIV for which she is on therapy, chronic kidney disease stage III, morbid obesity,  Hidradenitis suppurativa, and recurrent DVTs. He says that he has a filter in place. Patient just had a very recent acute DVT in September 2018 that occurred while he was off of Xarelto very briefly for a hip surgery. Patient says he was only off for about 5 days. He is since been evaluated by Dr. Kale/hematology. Initially plan was for patient to be transitioned to Coumadin. He took Lovenox for several days. Further discussions had been had an apparently this point decision is made to keep him on Xarelto. Patient has no current GI symptoms. Specifically no complaints of abdominal pain and changes in bowel habits melena or hematochezia. Appetite is been fine, weight has been stable. He does get occasional heartburn but not on a regular basis and has no complaints of dysphagia. Reviewing hematology notes patient also has iron deficiency which is felt to be chronic secondary to oozing from Hidradenitis  in setting of chronic anticoagulation. He was started on oral iron. Last hemoglobin was 10.4 No family history of colon cancer that he is aware of.  Review of Systems Pertinent positive and negative review of systems were noted in the above HPI section.  All other review of systems was otherwise negative.  Outpatient Encounter Medications as of 02/14/2017  Medication Sig  . bictegravir-emtricitabine-tenofovir AF (BIKTARVY) 50-200-25 MG TABS tablet Take 1 tablet daily by mouth.  . minocycline (MINOCIN,DYNACIN) 50 MG capsule Take 50 mg 2 (two) times daily by mouth.  . rivaroxaban (XARELTO) 20 MG TABS tablet  Take 20 mg daily with supper by mouth.  Marland Kitchen tiZANidine (ZANAFLEX) 4 MG tablet Take 1 tablet (4 mg total) by mouth every 8 (eight) hours as needed for muscle spasms.  . [DISCONTINUED] minocycline (MINOCIN,DYNACIN) 50 MG capsule Take 1 capsule (50 mg total) by mouth 2 (two) times daily.  . [DISCONTINUED] oxyCODONE (OXY IR/ROXICODONE) 5 MG immediate release tablet Take 1-2 tablets (5-10 mg total) by mouth every 6 (six) hours as needed for moderate pain, severe pain or breakthrough pain.  . [DISCONTINUED] enoxaparin (LOVENOX) 150 MG/ML injection Inject 1 mL (150 mg total) into the skin every 12 (twelve) hours. Start in the evening on 01-24-17 (Patient not taking: Reported on 02/14/2017)  . [DISCONTINUED] warfarin (COUMADIN) 5 MG tablet Take 1 tablet (5 mg total) by mouth daily. Start in the evening on 01-24-17 (Patient not taking: Reported on 02/14/2017)   No facility-administered encounter medications on file as of 02/14/2017.    Allergies  Allergen Reactions  . Sulfa Antibiotics Other (See Comments)    Low blood pressure  . Vancomycin Itching    Give with benadryl   Patient Active Problem List   Diagnosis Date Noted  . Need for Tdap vaccination 02/03/2017  . Colon cancer screening 02/03/2017  . Status post total replacement of left hip 11/29/2016  . Unilateral primary osteoarthritis, left hip 09/16/2016  . Edema 08/20/2016  . 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 Morbid (severe) obesity due to excess calories ([place]) 08/19/2016  . Pain in left hip 08/19/2016  . Solitary  pulmonary nodule 05/06/2016  . Hidradenitis suppurativa 04/27/2016  . Chest pain 04/26/2016  . Avascular necrosis of bone of hip, left ([place]) 03/19/2016  . Human immunodeficiency virus (HIV) disease ([place]) 03/18/2016  . Recurrent deep vein thrombosis (DVT) ([place]) 03/18/2016  . Axillary abscess 03/18/2016  . Chronic kidney disease 03/18/2016  . Encounter for immunization  03/18/2016  . Cholelithiasis 03/18/2016  . Diverticulosis of colon without hemorrhage 03/18/2016   Social History   Socioeconomic History  . Marital status: Not on file    Spouse name: Not on file  . Number of children: 2  . Years of education: Not on file  . Highest education level: Not on file  Social Needs  . Financial resource strain: Not on file  . Food insecurity - worry: Not on file  . Food insecurity - inability: Not on file  . Transportation needs - medical: Not on file  . Transportation needs - non-medical: Not on file  Occupational History  . Not on file  Tobacco Use  . Smoking status: Never Smoker  . Smokeless tobacco: Never Used  Substance and Sexual Activity  . Alcohol use: No    Comment: prior  . Drug use: No    Comment: prior cocaine use, last 2005  . Sexual activity: Yes    Partners: Female    Birth control/protection: Condom  Other Topics Concern  . Not on file  Social History Narrative  . Not on file    Allen Espinoza family history includes Bone cancer in his maternal aunt; Breast cancer in his maternal aunt; Heart attack in his father and mother; Prostate cancer in his brother.      Objective:    Vitals:   02/14/17 0949  BP: 126/64  Pulse: (!) 58    Physical Exam  well-developed obese African-American male in no acute distress, pleasant accompanied by his girlfriend. Blood pressure 126/64, pulse 58, height 6 foot, weight 300, BMI 40.69. HEENT; nontraumatic normocephalic EOMI PERRLA sclera anicteric, Cardiovascular ;regular rate and rhythm with S1-S2 no murmur or gallop, Pulmonary ;clear bilaterally, Abdomen; obese, soft, no palpable mass or hepatosplenomegaly, nontender he has some large bruises from recent Lovenox injections, Bowel sounds present, Rectal ;exam not done, Ext; no clubbing cyanosis or edema skin warm and dry, Neuropsych; mood and affect appropriate       Assessment & Plan:   #52 56 year old African-American male referred for  screening colonoscopy. Patient has not had any previous GI evaluation. #2 recurrent DVTs with most recent event September 2018 which occurred while he was off of Xarelto for about 5 days for hip surgery. #3 morbid obesity/BMI of 40 #4 HIV-on therapy   #5 chronic kidney disease stage III #6 iron deficiency anemia per hematology felt secondary to chronic oozing from known history hidradenitis suppurativa  Plan; As patient just had a very recent DVT, it is not appropriate to hold anticoagulation at this time. Is generally preferred that patient's be at least 6 months out from an acute DVT prior to interrupting anticoagulation for colonoscopy. Rationale explained to the patient, and he voices understanding. Given this patient's complicated history and recent development of clot while only off of anticoagulation for few days he may also be best served by having a Lovenox bridge when we do schedule colonoscopy. Will ask him to return to our office in March 2019, and if he has been otherwise stable we will plan for colonoscopy at that time with Dr. Carlean Purl., And arrange with Coumadin clinic for him to  have a Lovenox bridge. Greater than 50% of visit was spent in review of his recent records, and counseling and coordination of care. Allen Barrette S Rhone Ozaki PA-C 02/14/2017   Cc: Welford Roche,*   Agree with Ms. Genia Harold assessment and plan. Gatha Mayer, MD, [facility]

## 2017-02-19 ENCOUNTER — Ambulatory Visit: Payer: Medicaid Other | Admitting: Infectious Diseases

## 2017-03-04 ENCOUNTER — Other Ambulatory Visit: Payer: Self-pay | Admitting: Pharmacist

## 2017-03-04 ENCOUNTER — Telehealth: Payer: Self-pay | Admitting: *Deleted

## 2017-03-04 DIAGNOSIS — I825Z2 Chronic embolism and thrombosis of unspecified deep veins of left distal lower extremity: Secondary | ICD-10-CM

## 2017-03-04 DIAGNOSIS — I82409 Acute embolism and thrombosis of unspecified deep veins of unspecified lower extremity: Secondary | ICD-10-CM

## 2017-03-04 MED ORDER — RIVAROXABAN 20 MG PO TABS: 20.0000 mg | ORAL_TABLET | Freq: Every day | ORAL | 3 refills | Status: DC

## 2017-03-04 MED ORDER — RIVAROXABAN 20 MG PO TABS: 20.0000 mg | ORAL_TABLET | Freq: Every day | ORAL | 0 refills | Status: DC

## 2017-03-04 NOTE — Telephone Encounter (Signed)
Dr Maudie Mercury, pharm called to verify pt on 20mg  xarelto daily, is this accurate? Who should do the script?

## 2017-03-04 NOTE — Telephone Encounter (Signed)
20 mg daily is correct, I can send a new script. Thank you!

## 2017-03-04 NOTE — Progress Notes (Signed)
[facility] called to clarify whether patient is to continue taking rivaroxaban due to running out of refills. Prescriptions sent.

## 2017-04-01 HISTORY — PX: COLONOSCOPY: SHX174

## 2017-04-12 ENCOUNTER — Emergency Department (HOSPITAL_COMMUNITY): Payer: Medicaid Other

## 2017-04-12 ENCOUNTER — Emergency Department (HOSPITAL_COMMUNITY)
Admission: EM | Admit: 2017-04-12 | Discharge: 2017-04-12 | Disposition: A | Discharge status: Home / Self Care | Payer: Medicaid Other | Attending: Emergency Medicine | Admitting: Emergency Medicine

## 2017-04-12 ENCOUNTER — Other Ambulatory Visit: Payer: Self-pay

## 2017-04-12 ENCOUNTER — Encounter (HOSPITAL_COMMUNITY): Payer: Self-pay | Admitting: Emergency Medicine

## 2017-04-12 DIAGNOSIS — N183 Chronic kidney disease, stage 3 (moderate): Secondary | ICD-10-CM | POA: Insufficient documentation

## 2017-04-12 DIAGNOSIS — L732 Hidradenitis suppurativa: Secondary | ICD-10-CM | POA: Diagnosis not present

## 2017-04-12 DIAGNOSIS — Z79899 Other long term (current) drug therapy: Secondary | ICD-10-CM | POA: Diagnosis not present

## 2017-04-12 DIAGNOSIS — Z96642 Presence of left artificial hip joint: Secondary | ICD-10-CM | POA: Diagnosis not present

## 2017-04-12 DIAGNOSIS — R079 Chest pain, unspecified: Secondary | ICD-10-CM

## 2017-04-12 LAB — COMPREHENSIVE METABOLIC PANEL
ALBUMIN: 3 g/dL — AB (ref 3.5–5.0)
ALK PHOS: 80 U/L (ref 38–126)
ALT: 16 U/L — ABNORMAL LOW (ref 17–63)
ANION GAP: 8 (ref 5–15)
AST: 18 U/L (ref 15–41)
BILIRUBIN TOTAL: 1 mg/dL (ref 0.3–1.2)
BUN: 11 mg/dL (ref 6–20)
CO2: 23 mmol/L (ref 22–32)
Calcium: 8.7 mg/dL — ABNORMAL LOW (ref 8.9–10.3)
Chloride: 108 mmol/L (ref 101–111)
Creatinine, Ser: 1.4 mg/dL — ABNORMAL HIGH (ref 0.61–1.24)
GFR calc Af Amer: >60 mL/min (ref >60)
GFR, EST NON AFRICAN AMERICAN: 54 mL/min — AB (ref >60)
GLUCOSE: 78 mg/dL (ref 65–99)
POTASSIUM: 3.9 mmol/L (ref 3.5–5.1)
Sodium: 139 mmol/L (ref 135–145)
TOTAL PROTEIN: 7.5 g/dL (ref 6.5–8.1)

## 2017-04-12 LAB — CBC WITH DIFFERENTIAL/PLATELET
BASOS PCT: 0 %
Basophils Absolute: 0 10*3/uL (ref 0.0–0.1)
Eosinophils Absolute: 0.1 10*3/uL (ref 0.0–0.7)
Eosinophils Relative: 1 %
HEMATOCRIT: 36.7 % — AB (ref 39.0–52.0)
HEMOGLOBIN: 12.3 g/dL — AB (ref 13.0–17.0)
LYMPHS ABS: 2 10*3/uL (ref 0.7–4.0)
LYMPHS PCT: 22 %
MCH: 31.9 pg (ref 26.0–34.0)
MCHC: 33.5 g/dL (ref 30.0–36.0)
MCV: 95.3 fL (ref 78.0–100.0)
MONO ABS: 0.6 10*3/uL (ref 0.1–1.0)
MONOS PCT: 7 %
NEUTROS ABS: 6.4 10*3/uL (ref 1.7–7.7)
NEUTROS PCT: 70 %
Platelets: 225 10*3/uL (ref 150–400)
RBC: 3.85 MIL/uL — ABNORMAL LOW (ref 4.22–5.81)
RDW: 15.6 % — AB (ref 11.5–15.5)
WBC: 9.2 10*3/uL (ref 4.0–10.5)

## 2017-04-12 LAB — LIPASE, BLOOD: LIPASE: 22 U/L (ref 11–51)

## 2017-04-12 LAB — I-STAT CHEM 8, ED
BUN: 14 mg/dL (ref 6–20)
CALCIUM ION: 1.11 mmol/L — AB (ref 1.15–1.40)
CHLORIDE: 107 mmol/L (ref 101–111)
Creatinine, Ser: 1.4 mg/dL — ABNORMAL HIGH (ref 0.61–1.24)
Glucose, Bld: 82 mg/dL (ref 65–99)
HCT: 38 % — ABNORMAL LOW (ref 39.0–52.0)
Hemoglobin: 12.9 g/dL — ABNORMAL LOW (ref 13.0–17.0)
Potassium: 4.1 mmol/L (ref 3.5–5.1)
Sodium: 143 mmol/L (ref 135–145)
TCO2: 25 mmol/L (ref 22–32)

## 2017-04-12 LAB — I-STAT TROPONIN, ED
TROPONIN I, POC: 0 ng/mL (ref 0.00–0.08)
Troponin i, poc: 0.04 ng/mL (ref 0.00–0.08)

## 2017-04-12 IMAGING — CT CT ANGIO CHEST
2 of 6 series · 18 of 36 positions shown · IV contrast (Omni 300)
Comparison: Plain films [DATE].  CT [DATE].

CLINICAL DATA: Chest pain beginning 2 weeks ago with occasional
shortness of breath.

EXAM:
CT ANGIOGRAPHY CHEST WITH CONTRAST
TECHNIQUE: Multidetector CT imaging of the chest was performed using the
standard protocol during bolus administration of intravenous
contrast. Multiplanar CT image reconstructions and MIPs were
obtained to evaluate the vascular anatomy.
CONTRAST:  100mL [CA] IOPAMIDOL ([CA]) INJECTION 76%

[Series 7: pe thins · axial · 0.73mm/px · z∈[+1316,+1567]mm · 17 of 283 slices shown]
[im 16/283  lung]
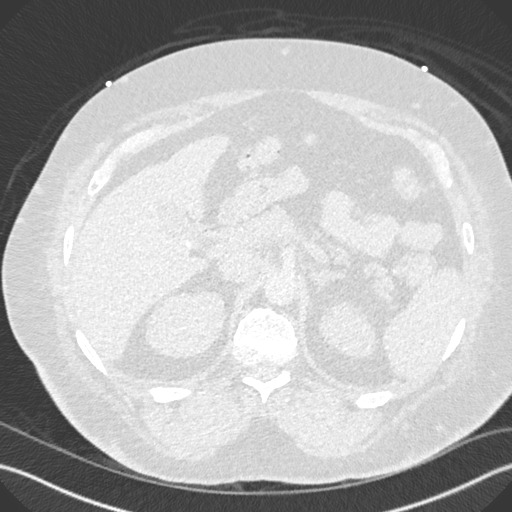
[im 32/283  mediastinal]
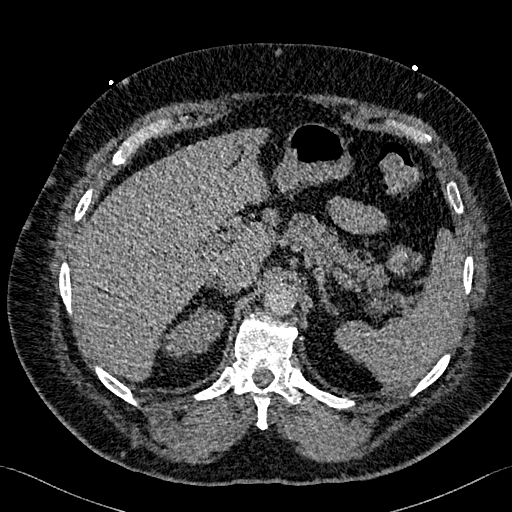
[im 48/283  lung]
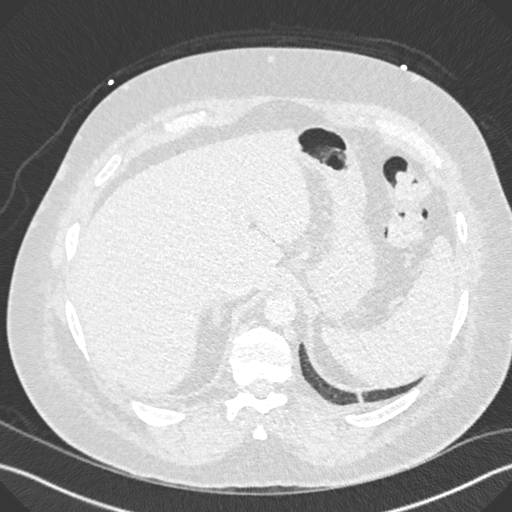
[im 63/283  mediastinal]
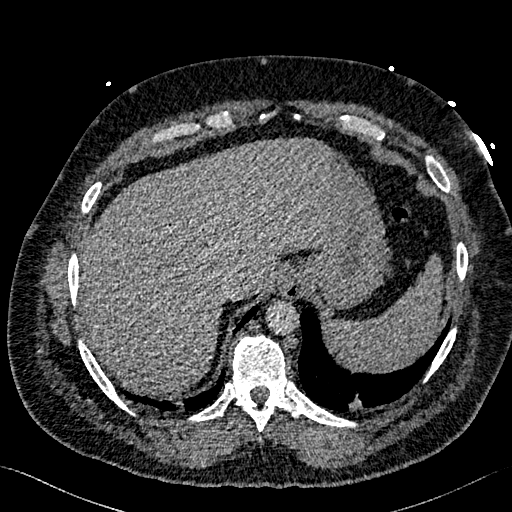
[im 79/283  lung]
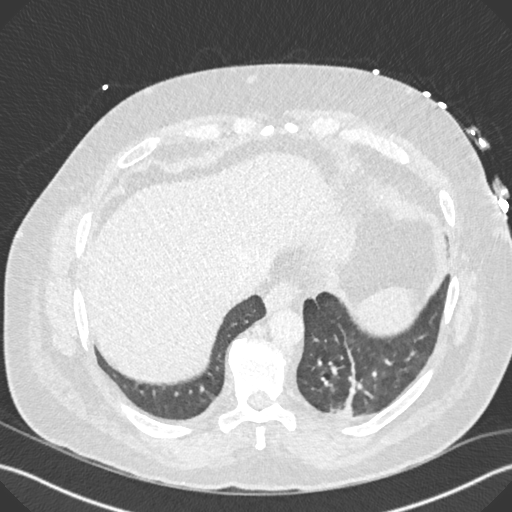
[im 95/283  mediastinal]
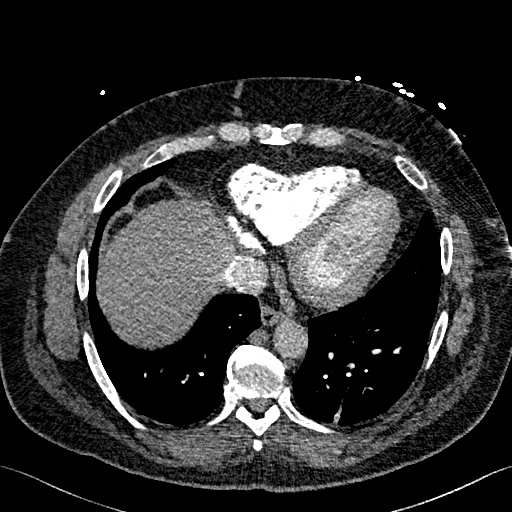
[im 110/283  lung]
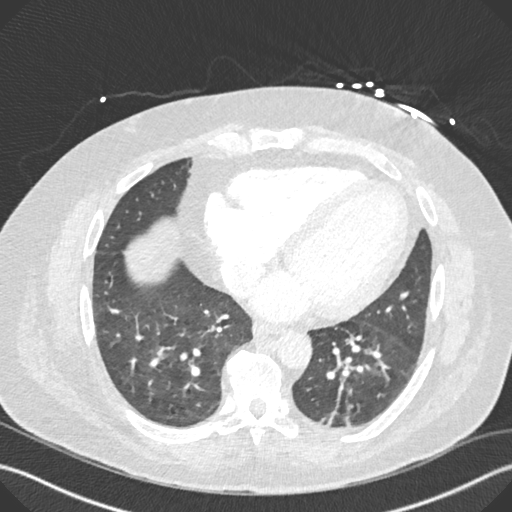
[im 126/283  mediastinal]
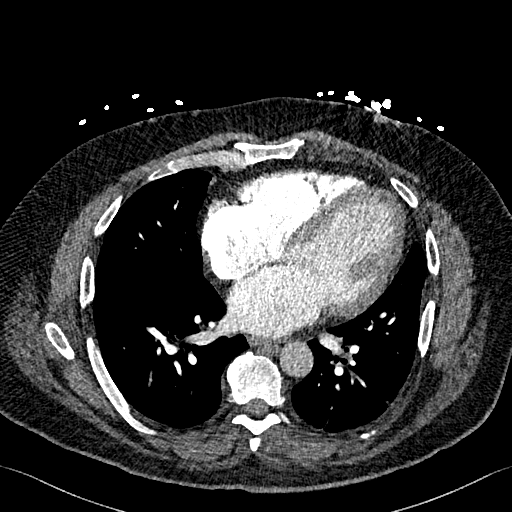
[im 142/283  lung]
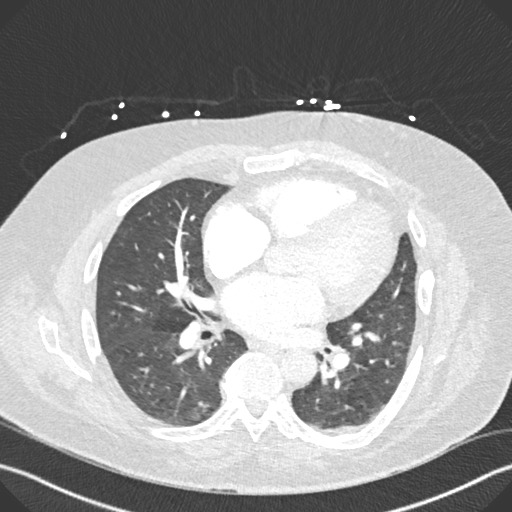
[im 157/283  mediastinal]
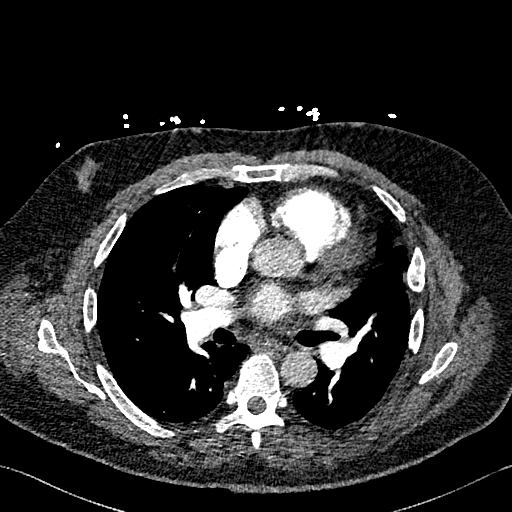
[im 173/283  lung]
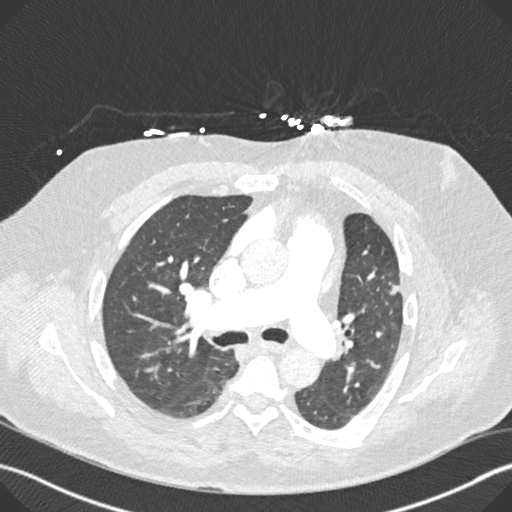
[im 189/283  mediastinal]
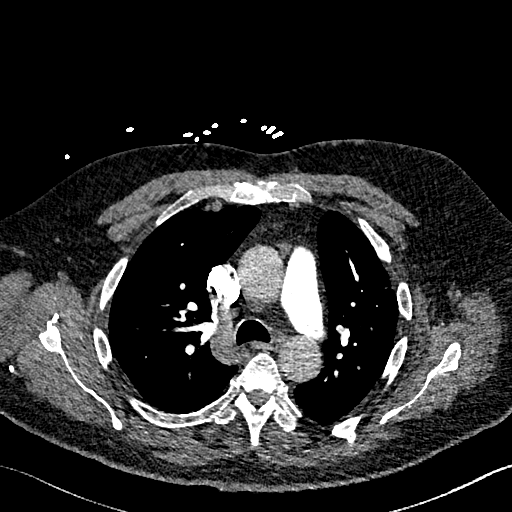
[im 204/283  lung]
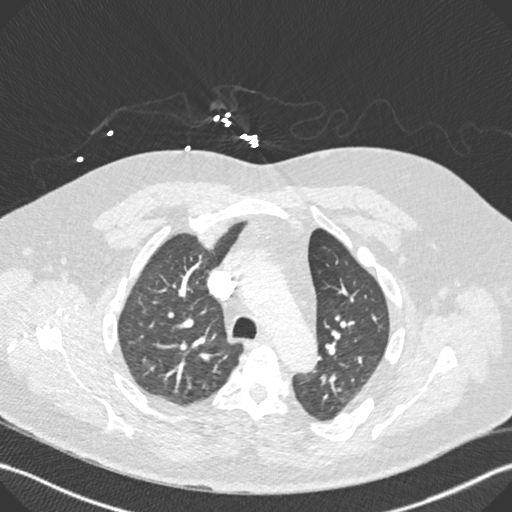
[im 220/283  mediastinal]
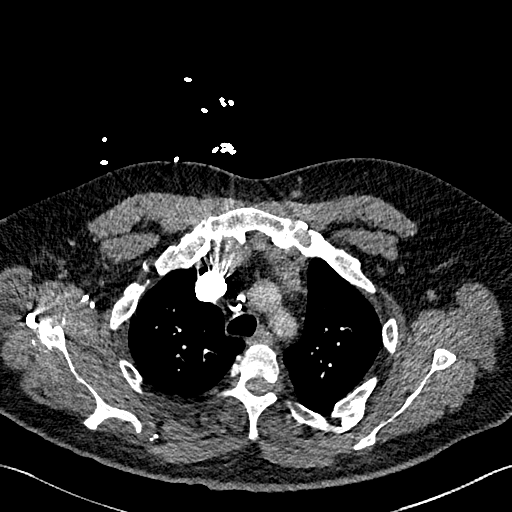
[im 236/283  lung]
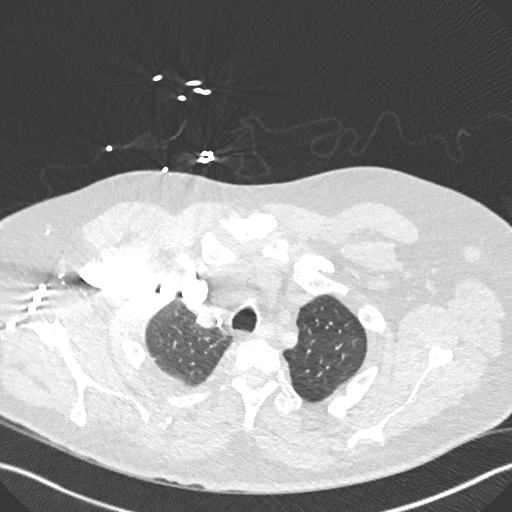
[im 251/283  mediastinal]
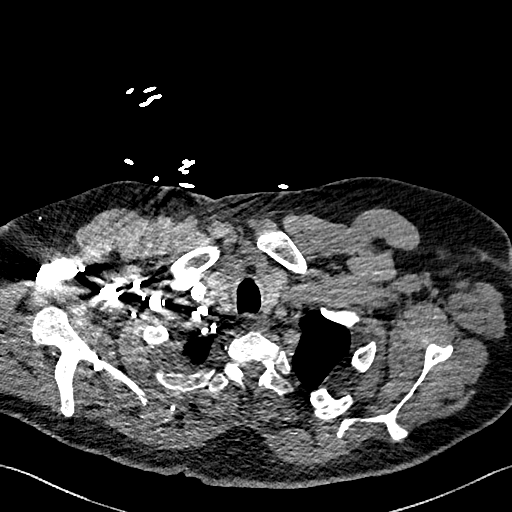
[im 267/283  lung]
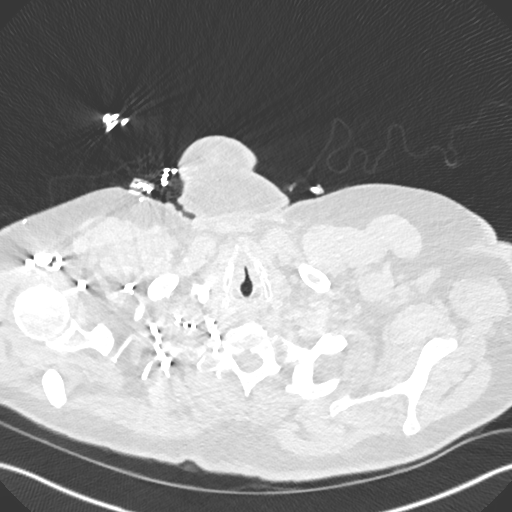

[Series 8: pe 2mm cor · coronal · 0.54mm/px · 1 of 151 slices shown]
[im 76/151  mediastinal]
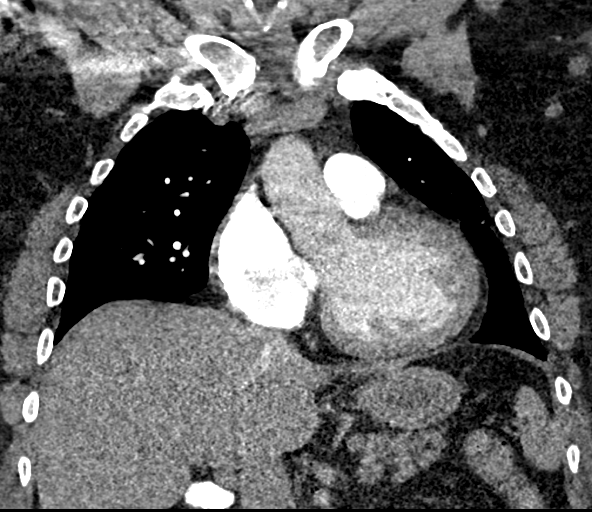

[18 of 36 positions shown; findings below may reference images not displayed]

FINDINGS: Cardiovascular: The quality of this exam for evaluation of pulmonary
embolism is good. No evidence of pulmonary embolism. Pulmonary
artery enlargement, outflow tract 3.1 cm.

Bovine arch. No aortic aneurysm. Aorta not well opacified so
evaluation for dissection somewhat limited. Mild cardiomegaly. No
pericardial effusion.

Mediastinum/Nodes: No mediastinal or hilar adenopathy. Tiny hiatal
hernia.

Lungs/Pleura: No pleural fluid. Bibasilar scarring. There is also
anterolateral left upper lobe scarring.

Upper Abdomen: Multiple small gallstones. Normal imaged portions of
the liver, spleen, pancreas, adrenal glands, kidneys.

Musculoskeletal: Mild right greater than left gynecomastia. Remote
left rib trauma.

Review of the MIP images confirms the above findings.
IMPRESSION: 1.  No evidence of pulmonary embolism.
2. Pulmonary artery enlargement suggests pulmonary arterial
hypertension.
3. Cholelithiasis.

## 2017-04-12 IMAGING — CR DG CHEST 2V
2 series · 2 of 2 positions shown · non-contrast
Comparison: [DATE]

CLINICAL DATA: Chest pain for 2 weeks.

EXAM:
CHEST  2 VIEW

[chest lat]
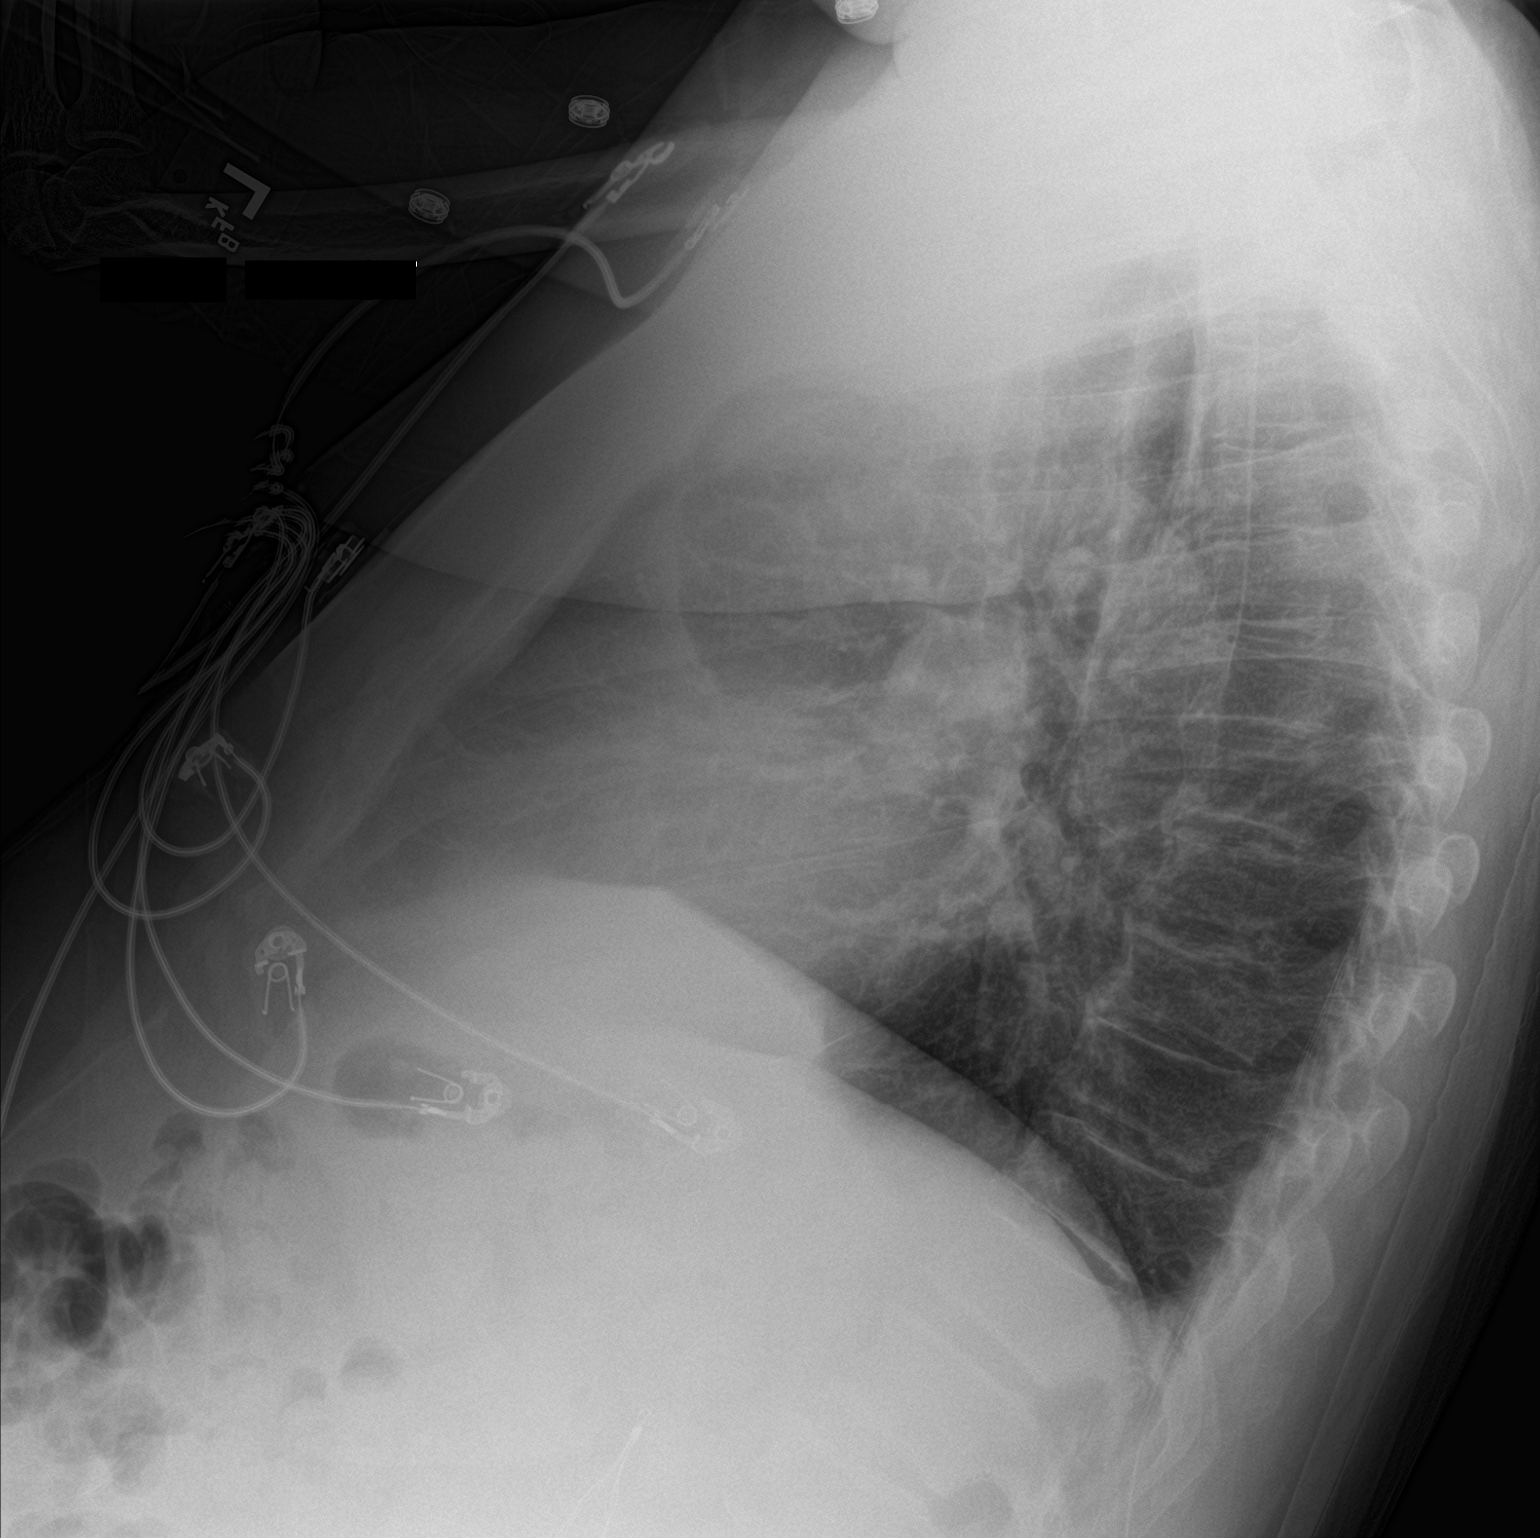

[chest ap]
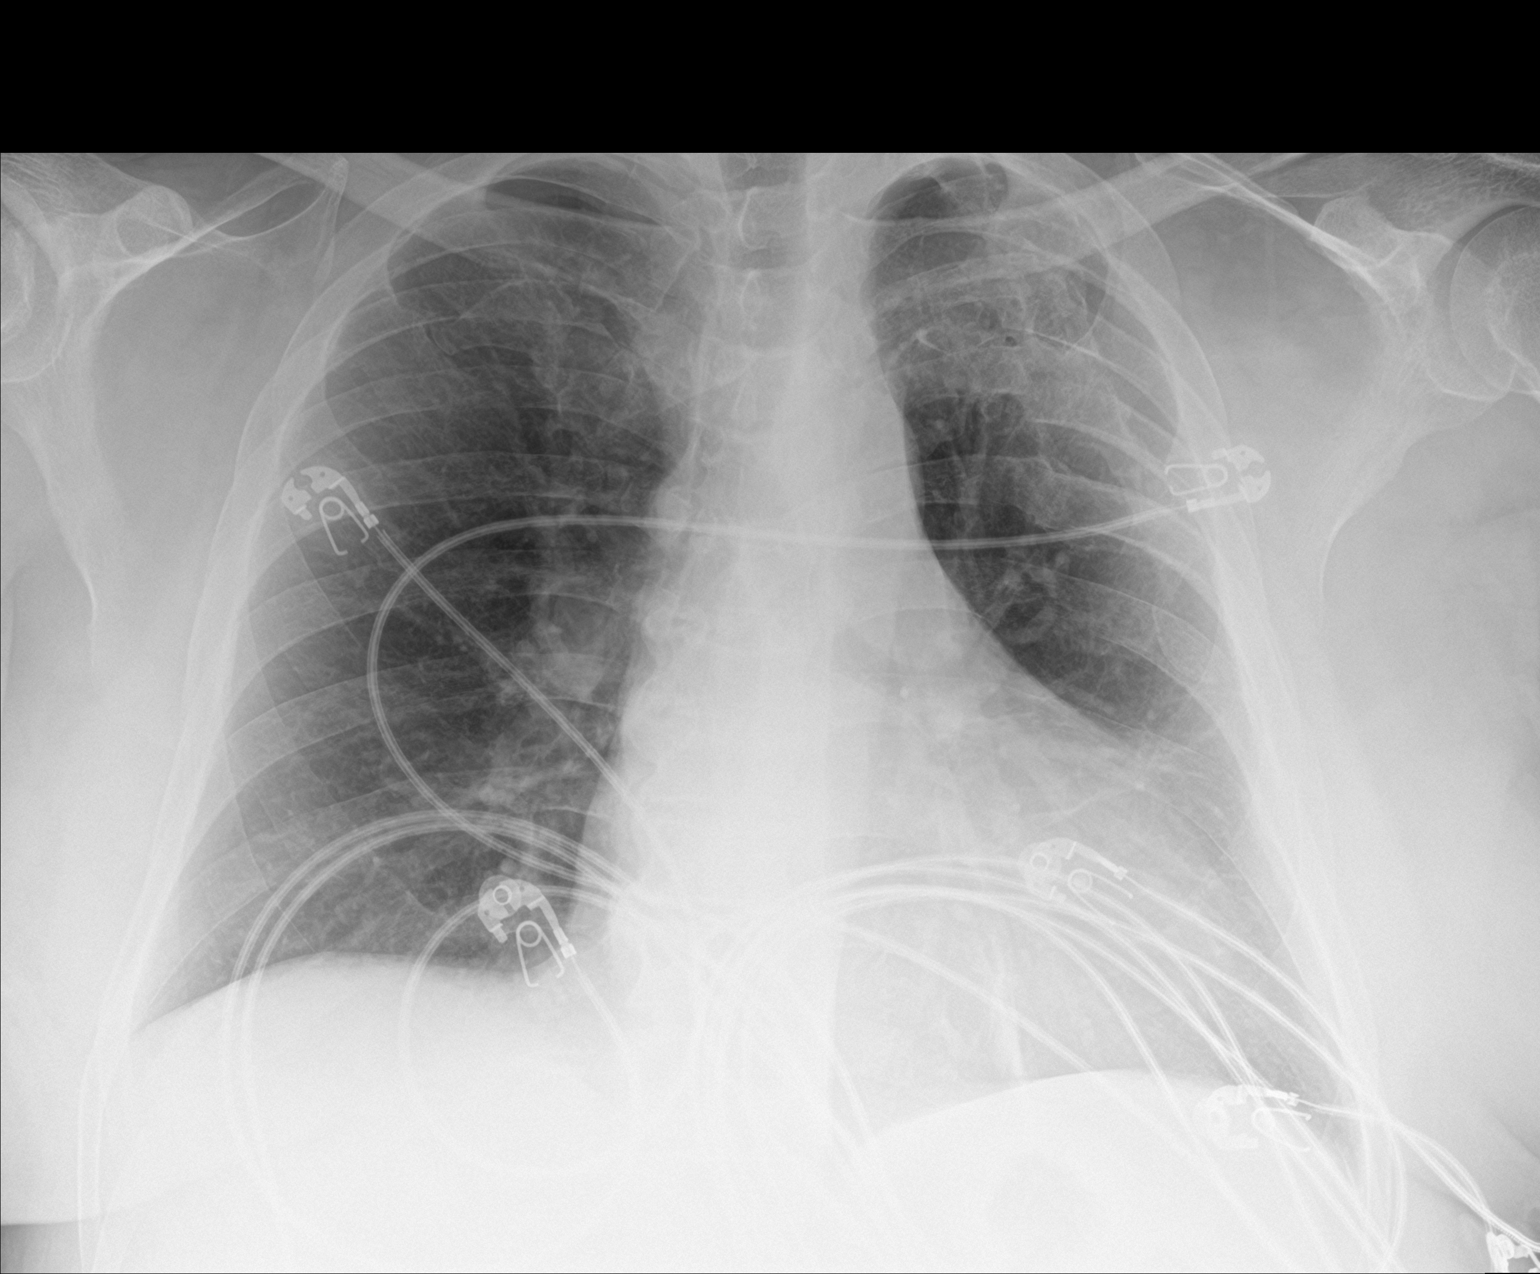

[2 of 2 positions shown; findings below may reference images not displayed]

FINDINGS: Healed left rib fractures. Stable cardiomegaly. The hila and
mediastinum are unchanged. No pulmonary nodules, masses, or focal
infiltrates.
IMPRESSION: No active cardiopulmonary disease.

## 2017-04-12 MED ORDER — LIDOCAINE HCL (PF) 1 % IJ SOLN
10.0000 mL | Freq: Once | INTRAMUSCULAR | Status: AC
  Administered 2017-04-12: 5 mL via INTRADERMAL
  Filled 2017-04-12: qty 10

## 2017-04-12 MED ORDER — GI COCKTAIL ~~LOC~~
30.0000 mL | Freq: Once | ORAL | Status: AC
  Administered 2017-04-12: 30 mL via ORAL
  Filled 2017-04-12: qty 30

## 2017-04-12 MED ORDER — IOPAMIDOL (ISOVUE-370) INJECTION 76%
INTRAVENOUS | Status: AC
  Administered 2017-04-12: 100 mL
  Filled 2017-04-12: qty 100

## 2017-04-12 NOTE — Discharge Instructions (Signed)
Please follow with your primary care doctor in the next 2 days for a check-up. They must obtain records for further management.  ° °Do not hesitate to return to the Emergency Department for any new, worsening or concerning symptoms.  ° °

## 2017-04-12 NOTE — ED Provider Notes (Signed)
[facility] EMERGENCY DEPARTMENT Provider Note   CSN: [id number] Arrival date & time: 04/12/17  2440     History   Chief Complaint Chief Complaint  Patient presents with  . Chest Pain    HPI   Blood pressure (!) 157/64, pulse (!) 48, temperature 98.1 F (36.7 C), temperature source Oral, resp. rate 15, height 6' (1.829 m), weight 136.1 kg (300 lb), SpO2 95 %.  Allen Espinoza is a 57 y.o. male with past medical history significant for HIV (per patient last viral load was undetectable, no values in our EMR), CKD, DVT (compliant with Xarelto) complaining of intermittent migratory anterior chest pain described as tightness with sharp sensation onset several weeks ago, becoming more frequent.  Initially he thought it was reflux because it seemed to follow eating but he states it does not have that pattern anymore.  It is not exacerbated with exertion, deep breathing or palpation.  No medications tried at home.  It is associated with shortness of breath, no diaphoresis, nausea vomiting, fever, chills, cough, increasing peripheral edema.  He states that he had a hip replacement on the left side in August.  He states he is trying to be more active, no unilateral edema or pain in the lower extremities but he does state that sometimes when he sits in the chair the lower extremity swelling or painful.  This patient also has a history of hidradenitis supra T [place], he had a axillary abscess drained by Dr. Ninfa Linden several weeks ago but he feels another is growing he also has some irritation in the groin area.  No worse than normal and at his baseline has a appointment pending with a specialized dermatologist in [facility] for this.  Dr. Ninfa Linden is aware and has evaluated it.  Past Medical History:  Diagnosis Date  . Abscess   . Arthritis    all over  . Avascular necrosis of femoral head, left ([place])   . Cholelithiasis   . CKD (chronic kidney disease) stage 3, GFR 30-59 ml/min (HCC)      sees kidney Dr.  . Diverticulosis   . DVT (deep venous thrombosis) (HCC)    legs  . Dyspnea    when walking  . Dysrhythmia    remembers mother taking about having an irregular rhythm years when he was a child   . GERD (gastroesophageal reflux disease)   . HIV (human immunodeficiency virus infection) ([place])   . Morbid obesity [facility])     Patient Active Problem List   Diagnosis Date Noted  . Need for Tdap vaccination 02/03/2017  . Colon cancer screening 02/03/2017  . Status post total replacement of left hip 11/29/2016  . Unilateral primary osteoarthritis, left hip 09/16/2016  . Edema 08/20/2016  . 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 Morbid (severe) obesity due to excess calories ([place]) 08/19/2016  . Pain in left hip 08/19/2016  . Solitary pulmonary nodule 05/06/2016  . Hidradenitis suppurativa 04/27/2016  . Chest pain 04/26/2016  . Avascular necrosis of bone of hip, left ([place]) 03/19/2016  . Human immunodeficiency virus (HIV) disease ([place]) 03/18/2016  . Recurrent deep vein thrombosis (DVT) ([place]) 03/18/2016  . Axillary abscess 03/18/2016  . Chronic kidney disease 03/18/2016  . Encounter for immunization 03/18/2016  . Cholelithiasis 03/18/2016  . Diverticulosis of colon without hemorrhage 03/18/2016    Past Surgical History:  Procedure Laterality Date  . DENTAL SURGERY     had teeth  pulled  . HYDRADENITIS EXCISION Left 10/14/2016   Procedure: WIDE EXCISION HIDRADENITIS LEFT AXILLA;  Surgeon: Coralie Keens, MD;  Location: [facility];  Service: General;  Laterality: Left;  . JOINT REPLACEMENT     Left hip Dr. Ninfa Linden 11/29/16  . TOTAL HIP ARTHROPLASTY Left 11/29/2016   Procedure: LEFT TOTAL HIP ARTHROPLASTY ANTERIOR APPROACH;  Surgeon: [name], MD;  Location: [facility];  Service: Orthopedics;  Laterality: Left;       Home Medications    Prior to Admission medications   Medication Sig Start Date End Date  Taking? Authorizing Provider  bictegravir-emtricitabine-tenofovir AF (BIKTARVY) 50-200-25 MG TABS tablet Take 1 tablet daily by mouth. 02/12/17  Yes Hatcher, Doroteo Bradford, MD  minocycline (MINOCIN,DYNACIN) 50 MG capsule Take 50 mg 2 (two) times daily by mouth.   Yes [provider]  rivaroxaban (XARELTO) 20 MG TABS tablet Take 1 tablet (20 mg total) by mouth daily with supper. 03/04/17  Yes Santos-Sanchez, Merlene Morse, MD  tiZANidine (ZANAFLEX) 4 MG tablet Take 1 tablet (4 mg total) by mouth every 8 (eight) hours as needed for muscle spasms. Patient not taking: Reported on 04/12/2017 11/30/16   [name], MD    Family History Family History  Problem Relation Age of Onset  . Heart attack Mother   . Heart attack Father   . Prostate cancer Brother   . Bone cancer Maternal Aunt   . Breast cancer Maternal Aunt     Social History Social History   Tobacco Use  . Smoking status: Never Smoker  . Smokeless tobacco: Never Used  Substance Use Topics  . Alcohol use: No    Comment: prior  . Drug use: No    Comment: prior cocaine use, last 2005     Allergies   Sulfa antibiotics and Vancomycin   Review of Systems Review of Systems  A complete review of systems was obtained and all systems are negative except as noted in the HPI and PMH.   Physical Exam Updated Vital Signs BP (!) 187/78 (BP Location: Left Arm)   Pulse (!) 59   Temp 98.1 F (36.7 C) (Oral)   Resp (!) 21   Ht 6' (1.829 m)   Wt 136.1 kg (300 lb)   SpO2 98%   BMI 40.69 kg/m   Physical Exam  Constitutional: He is oriented to person, place, and time. He appears well-developed and well-nourished. No distress.  Obese  HENT:  Head: Normocephalic.  Mouth/Throat: Oropharynx is clear and moist.  Eyes: Conjunctivae are normal.  Neck: Normal range of motion. No JVD present. No tracheal deviation present.  Cardiovascular: Normal rate, regular rhythm and intact distal pulses.  Radial pulse equal bilaterally    Pulmonary/Chest: Effort normal and breath sounds normal. No stridor. No respiratory distress. He has no wheezes. He has no rales. He exhibits no tenderness.  Abdominal: Soft. He exhibits no distension and no mass. There is no tenderness. There is no rebound and no guarding.  Musculoskeletal: Normal range of motion. He exhibits no edema or tenderness.  No calf asymmetry, superficial collaterals, palpable cords, edema, Homans sign negative bilaterally.    Neurological: He is alert and oriented to person, place, and time.  Skin: Skin is warm. He is not diaphoretic.  Left axilla with incision healing by secondary intention 3 cm indurated abscess with no significant surrounding cellulitis.  Groin with multiple sinus tracts and actively draining abscesses no underlying crepitance or pain out of proportion to exam.  Psychiatric: He has  a normal mood and affect.  Nursing note and vitals reviewed.    ED Treatments / Results  Labs (all labs ordered are listed, but only abnormal results are displayed) Labs Reviewed  CBC WITH DIFFERENTIAL/PLATELET - Abnormal; Notable for the following components:      Result Value   RBC 3.85 (*)    Hemoglobin 12.3 (*)    HCT 36.7 (*)    RDW 15.6 (*)    All other components within normal limits  COMPREHENSIVE METABOLIC PANEL - Abnormal; Notable for the following components:   Creatinine, Ser 1.40 (*)    Calcium 8.7 (*)    Albumin 3.0 (*)    ALT 16 (*)    GFR calc non Af Amer 54 (*)    All other components within normal limits  I-STAT CHEM 8, ED - Abnormal; Notable for the following components:   Creatinine, Ser 1.40 (*)    Calcium, Ion 1.11 (*)    Hemoglobin 12.9 (*)    HCT 38.0 (*)    All other components within normal limits  LIPASE, BLOOD  I-STAT TROPONIN, ED  I-STAT TROPONIN, ED    EKG  EKG Interpretation  Date/Time:  Saturday April 12 2017 08:36:44 EST Ventricular Rate:  50 PR Interval:    QRS Duration: 98 QT Interval:  443 QTC  Calculation: 404 R Axis:   32 Text Interpretation:  Normal sinus rhythm No significant change since last tracing Confirmed by Virgel Manifold [phone]) on 04/12/2017 9:54:27 AM       Radiology Dg Chest 2 View  Result Date: 04/12/2017 CLINICAL DATA:  Chest pain for 2 weeks. EXAM: CHEST  2 VIEW COMPARISON:  April 27, 2016 FINDINGS: Healed left rib fractures. Stable cardiomegaly. The hila and mediastinum are unchanged. No pulmonary nodules, masses, or focal infiltrates. IMPRESSION: No active cardiopulmonary disease. Electronically Signed   By: Dorise Bullion III M.D   On: 04/12/2017 09:38   Ct Angio Chest Pe W And/or Wo Contrast  Result Date: 04/12/2017 CLINICAL DATA:  Chest pain beginning 2 weeks ago with occasional shortness of breath. EXAM: CT ANGIOGRAPHY CHEST WITH CONTRAST TECHNIQUE: Multidetector CT imaging of the chest was performed using the standard protocol during bolus administration of intravenous contrast. Multiplanar CT image reconstructions and MIPs were obtained to evaluate the vascular anatomy. CONTRAST:  17mL ISOVUE-370 IOPAMIDOL (ISOVUE-370) INJECTION 76% COMPARISON:  Plain films 04/12/2017.  CT 05/17/2016. FINDINGS: Cardiovascular: The quality of this exam for evaluation of pulmonary embolism is good. No evidence of pulmonary embolism. Pulmonary artery enlargement, outflow tract 3.1 cm. Bovine arch. No aortic aneurysm. Aorta not well opacified so evaluation for dissection somewhat limited. Mild cardiomegaly. No pericardial effusion. Mediastinum/Nodes: No mediastinal or hilar adenopathy. Tiny hiatal hernia. Lungs/Pleura: No pleural fluid. Bibasilar scarring. There is also anterolateral left upper lobe scarring. Upper Abdomen: Multiple small gallstones. Normal imaged portions of the liver, spleen, pancreas, adrenal glands, kidneys. Musculoskeletal: Mild right greater than left gynecomastia. Remote left rib trauma. Review of the MIP images confirms the above findings. IMPRESSION: 1.  No  evidence of pulmonary embolism. 2. Pulmonary artery enlargement suggests pulmonary arterial hypertension. 3. Cholelithiasis. Electronically Signed   By: Abigail Miyamoto M.D.   On: 04/12/2017 11:00    Procedures .Marland KitchenIncision and Drainage Date/Time: 04/12/2017 12:14 PM Performed by: Monico Blitz, PA-C Authorized by: Monico Blitz, PA-C   Consent:    Consent obtained:  Verbal   Consent given by:  Patient Location:    Type:  Abscess   Location:  Tr[facility] Pre-procedure  details:    Skin preparation:  Antiseptic wash Procedure type:    Complexity:  Complex Procedure details:    Incision types:  Single straight   Incision depth:  Dermal   Scalpel blade:  11   Wound management:  Probed and deloculated and irrigated with saline   Drainage:  Bloody and purulent   Drainage amount:  Moderate   Wound treatment:  Wound left open   Packing materials:  None Post-procedure details:    Patient tolerance of procedure:  Tolerated well, no immediate complications   (including critical care time)  Medications Ordered in ED Medications  lidocaine (PF) (XYLOCAINE) 1 % injection 10 mL (5 mLs Intradermal Given 04/12/17 1129)  gi cocktail (Maalox,Lidocaine,Donnatal) (30 mLs Oral Given by Other 04/12/17 0956)  iopamidol (ISOVUE-370) 76 % injection (100 mLs  Contrast Given 04/12/17 1025)     Initial Impression / Assessment and Plan / ED Course  I have reviewed the triage vital signs and the nursing notes.  Pertinent labs & imaging results that were available during my care of the patient were reviewed by me and considered in my medical decision making (see chart for details).     Vitals:   04/12/17 0945 04/12/17 1000 04/12/17 1015 04/12/17 1103  BP: (!) 163/73 (!) 174/68 (!) 162/67 (!) 187/78  Pulse: (!) 45 (!) 45 (!) 52 (!) 59  Resp: 19 (!) 21 19 (!) 21  Temp:      TempSrc:      SpO2: 100% 100% 100% 98%  Weight:      Height:        Medications  lidocaine (PF) (XYLOCAINE) 1 % injection 10  mL (5 mLs Intradermal Given 04/12/17 1129)  gi cocktail (Maalox,Lidocaine,Donnatal) (30 mLs Oral Given by Other 04/12/17 0956)  iopamidol (ISOVUE-370) 76 % injection (100 mLs  Contrast Given 04/12/17 1025)    Allen Espinoza is 57 y.o. male presenting with chest pain intermittently over the course of the last week associated with shortness of breath, this patient has a history of chronic DVT, he is taking Xarelto for this.  Physical exam reassuring with no tachycardia, in fact he is bradycardic but given his known DVT and chest pain which she describes as sharp and associated with shortness of breath will obtain CTA.  EKG nonischemic, troponin negative, blood work otherwise reassuring.  Patient has recurrent abscess with his hidradenitis supra T [place].  He is pending specialist evaluation for the areas affecting the groin, I&D performed on the left axilla.  He is currently on antibiotics.  CT angios without PE however they do note a dilated pulmonary artery.  Repeat troponin is negative.  Patient has been chest pain-free for his entire stay in the ED, advised him to follow closely with primary care.  Evaluation does not show pathology that would require ongoing emergent intervention or inpatient treatment. Pt is hemodynamically stable and mentating appropriately. Discussed findings and plan with patient/guardian, who agrees with care plan. All questions answered. Return precautions discussed and outpatient follow up given.    Final Clinical Impressions(s) / ED Diagnoses   Final diagnoses:  Chest pain, unspecified type  Hidradenitis suppurativa    ED Discharge Orders    None       Karen Kays Charna Elizabeth 04/12/17 1214    Virgel Manifold, MD 04/12/17 1525

## 2017-04-12 NOTE — ED Notes (Signed)
Dressing saturated again. Abd pad and tape reapplied; EDP aware.

## 2017-04-12 NOTE — ED Triage Notes (Signed)
To ED via [facility] from home with c/o chest pain x 2 weeks, intermittent-- pt has no hx of MI, had an abscess excised under left arm recently-- area is hot, red, painful and still draining.

## 2017-04-12 NOTE — ED Notes (Signed)
Pt axilla bandage saturated with blood; dressing changed.

## 2017-05-01 ENCOUNTER — Other Ambulatory Visit: Payer: Medicaid Other

## 2017-05-01 ENCOUNTER — Other Ambulatory Visit (HOSPITAL_COMMUNITY)
Admission: RE | Admit: 2017-05-01 | Discharge: 2017-05-01 | Disposition: A | Discharge status: Home / Self Care | Payer: Medicaid Other | Source: Ambulatory Visit | Attending: Infectious Diseases | Admitting: Infectious Diseases

## 2017-05-01 DIAGNOSIS — B20 Human immunodeficiency virus [HIV] disease: Secondary | ICD-10-CM

## 2017-05-01 DIAGNOSIS — Z79899 Other long term (current) drug therapy: Secondary | ICD-10-CM

## 2017-05-01 DIAGNOSIS — Z113 Encounter for screening for infections with a predominantly sexual mode of transmission: Secondary | ICD-10-CM | POA: Insufficient documentation

## 2017-05-01 DIAGNOSIS — I825Z2 Chronic embolism and thrombosis of unspecified deep veins of left distal lower extremity: Secondary | ICD-10-CM

## 2017-05-01 LAB — COMPLETE METABOLIC PANEL WITH GFR
AG RATIO: 0.8 (calc) — AB (ref 1.0–2.5)
ALBUMIN MSPROF: 3.8 g/dL (ref 3.6–5.1)
ALT: 13 U/L (ref 9–46)
AST: 14 U/L (ref 10–35)
Alkaline phosphatase (APISO): 96 U/L (ref 40–115)
BILIRUBIN TOTAL: 0.5 mg/dL (ref 0.2–1.2)
BUN / CREAT RATIO: 9 (calc) (ref 6–22)
BUN: 16 mg/dL (ref 7–25)
CHLORIDE: 106 mmol/L (ref 98–110)
CO2: 26 mmol/L (ref 20–32)
Calcium: 9.1 mg/dL (ref 8.6–10.3)
Creat: 1.75 mg/dL — ABNORMAL HIGH (ref 0.70–1.33)
GFR, EST AFRICAN AMERICAN: 49 mL/min/{1.73_m2} — AB (ref >=60)
GFR, Est Non African American: 42 mL/min/{1.73_m2} — ABNORMAL LOW (ref >=60)
GLOBULIN: 4.5 g/dL — AB (ref 1.9–3.7)
Glucose, Bld: 92 mg/dL (ref 65–99)
POTASSIUM: 4.2 mmol/L (ref 3.5–5.3)
SODIUM: 140 mmol/L (ref 135–146)
TOTAL PROTEIN: 8.3 g/dL — AB (ref 6.1–8.1)

## 2017-05-01 LAB — CBC
HEMATOCRIT: 38.2 % — AB (ref 38.5–50.0)
HEMOGLOBIN: 13 g/dL — AB (ref 13.2–17.1)
MCH: 32 pg (ref 27.0–33.0)
MCHC: 34 g/dL (ref 32.0–36.0)
MCV: 94.1 fL (ref 80.0–100.0)
MPV: 10.3 fL (ref 7.5–12.5)
Platelets: 285 10*3/uL (ref 140–400)
RBC: 4.06 10*6/uL — AB (ref 4.20–5.80)
RDW: 13.6 % (ref 11.0–15.0)
WBC: 8.2 10*3/uL (ref 3.8–10.8)

## 2017-05-02 LAB — LIPID PANEL
CHOLESTEROL: 142 mg/dL (ref <200)
HDL: 62 mg/dL (ref >40)
LDL Cholesterol (Calc): 67 mg/dL (calc)
Non-HDL Cholesterol (Calc): 80 mg/dL (calc) (ref <130)
TRIGLYCERIDES: 57 mg/dL (ref <150)
Total CHOL/HDL Ratio: 2.3 (calc) (ref <5.0)

## 2017-05-02 LAB — URINE CYTOLOGY ANCILLARY ONLY
Chlamydia: NEGATIVE
Neisseria Gonorrhea: NEGATIVE

## 2017-05-02 LAB — T-HELPER CELL (CD4) - (RCID CLINIC ONLY)
CD4 T CELL ABS: 190 /uL — AB (ref 400–2700)
CD4 T CELL HELPER: 8 % — AB (ref 33–55)

## 2017-05-02 LAB — RPR: RPR Ser Ql: NONREACTIVE

## 2017-05-05 LAB — HIV-1 RNA QUANT-NO REFLEX-BLD
HIV 1 RNA Quant: <20 copies/mL
HIV-1 RNA QUANT, LOG: NOT DETECTED {Log_copies}/mL

## 2017-05-19 ENCOUNTER — Encounter: Admit: 2017-05-19 | Discharge: 2017-05-20 | Payer: BLUE CROSS/BLUE SHIELD

## 2017-05-19 ENCOUNTER — Ambulatory Visit: Payer: Medicaid Other | Admitting: Infectious Diseases

## 2017-05-19 DIAGNOSIS — L732 Hidradenitis suppurativa: Secondary | ICD-10-CM | POA: Diagnosis not present

## 2017-05-19 MED ORDER — LEVOFLOXACIN 750 MG TABLET
ORAL_TABLET | Freq: Every day | ORAL | 0 refills | 0 days | Status: CP
Start: 2017-05-19 — End: 2017-06-18

## 2017-05-19 MED ORDER — METRONIDAZOLE 250 MG TABLET
ORAL_TABLET | Freq: Three times a day (TID) | ORAL | 0 refills | 0 days | Status: CP
Start: 2017-05-19 — End: 2017-06-04

## 2017-05-21 ENCOUNTER — Ambulatory Visit (INDEPENDENT_AMBULATORY_CARE_PROVIDER_SITE_OTHER): Payer: Medicaid Other | Admitting: Infectious Diseases

## 2017-05-21 ENCOUNTER — Encounter: Payer: Self-pay | Admitting: Infectious Diseases

## 2017-05-21 ENCOUNTER — Telehealth: Payer: Self-pay

## 2017-05-21 DIAGNOSIS — I825Z2 Chronic embolism and thrombosis of unspecified deep veins of left distal lower extremity: Secondary | ICD-10-CM | POA: Diagnosis present

## 2017-05-21 DIAGNOSIS — L732 Hidradenitis suppurativa: Secondary | ICD-10-CM

## 2017-05-21 DIAGNOSIS — Z1211 Encounter for screening for malignant neoplasm of colon: Secondary | ICD-10-CM

## 2017-05-21 DIAGNOSIS — B20 Human immunodeficiency virus [HIV] disease: Secondary | ICD-10-CM | POA: Diagnosis not present

## 2017-05-21 NOTE — Assessment & Plan Note (Signed)
Working on wt loss.

## 2017-05-21 NOTE — Telephone Encounter (Signed)
Per Dr. [name] I spoke with Lorriane Shire at Gastroenterology to make a follow up appointment with [facility]. The pt currently has a follow up on March 5th at 9 am. He will need to bring his insurance card with him to his appt. He will receive any necessary paper work at the office. He can call [phone] if he needs to reschedule.  Called the pt to inform him of his appt. He stated that he would call us back since he could not take the call.  [place]

## 2017-05-21 NOTE — Assessment & Plan Note (Signed)
He would like to have clot lysis or stripping if possible, vs lifelong anti-coagulation.

## 2017-05-21 NOTE — Assessment & Plan Note (Signed)
He will call GI back for appt.

## 2017-05-21 NOTE — Progress Notes (Signed)
   Subjective:    Patient ID: Allen Espinoza, male    DOB: 15-Feb-1961, 57 y.o.   MRN: [id number]  HPI 57 yo M with hx of HIV+ (dx 2007 when he was in [place]).Taking genvoya/prezista then changed to biktarvy.  He also has a hx of hidradenitis suppurative.He has lesions in groin and L axilla. He had L axilla sugery 09-2016.   He had been on xarelto for DVT- this was stopped for L THR 12-03-2016. He was then seen in ED on 9-21 with LLE swelling and found to have chronic clots and popliteal clot. He was started on coumadin and LMWH, then back on Xarelto 20mg  daily.  He has an IVC filter as well.   Was seen by Derm at [facility] and needs to change his anbx and shots. (Clinda --> Flagyl and levaquin).  Has been eating well, wants to lose wt (> 300#).  Has been taking his ART and xarelto.  Waiting to have his colonoscopy.  Hip is doing well- normal wt bearing.   HIV 1 RNA Quant (copies/mL)  Date Value  05/01/2017 <20 NOT DETECTED  11/19/2016 <20 NOT DETECTED  08/07/2016 <20 NOT DETECTED   HIV-1 RNA Viral Load (copies/mL)  Date Value  03/18/2016 47,300   CD4 T Cell Abs (/uL)  Date Value  05/01/2017 190 (L)  08/07/2016 160 (L)  08/01/2016 130 (L)    Review of Systems  Constitutional: Negative for appetite change and unexpected weight change.  Respiratory: Negative for cough and shortness of breath.   Cardiovascular: Positive for leg swelling.  Gastrointestinal: Negative for constipation and diarrhea.  Genitourinary: Negative for difficulty urinating.  Musculoskeletal: Positive for arthralgias (L shoulder pain from debridement for hidradenitis).  Hematological: Does not bruise/bleed easily.  has been seen by GU surgery for his groin hidradenitis.  Please see HPI. All other systems reviewed and negative.      Objective:   Physical Exam  Constitutional: He appears well-developed and well-nourished.  HENT:  Mouth/Throat: No oropharyngeal exudate.  Eyes: EOM are normal. Pupils are  equal, round, and reactive to light.  Neck: Neck supple.  Cardiovascular: Normal rate, regular rhythm and normal heart sounds.  Pulmonary/Chest: Effort normal and breath sounds normal.  Abdominal: Soft. Bowel sounds are normal. There is no tenderness.  Musculoskeletal: He exhibits edema.  Lymphadenopathy:    He has no cervical adenopathy.  Psychiatric: He has a normal mood and affect.       Assessment & Plan:

## 2017-05-21 NOTE — Assessment & Plan Note (Signed)
He is doing well, although incomplete immune reconstitution.  Will continue his current art. With CD4 and undetectable, will defer OI prophylaxis (bactrim) at this point.  vax are up to date He will call about colonoscopy Has condoms.  Explained undetectable vs HIV+ vs cure rtc in 9 months.

## 2017-05-21 NOTE — Assessment & Plan Note (Signed)
Appreciate derm eval.  Appreciate his anbx change.  If pt given biologic (humira ect) he will be at some risk of OI due to his low CD4.  Will defer to derm and surgery.

## 2017-05-23 DIAGNOSIS — L732 Hidradenitis suppurativa: Secondary | ICD-10-CM | POA: Diagnosis not present

## 2017-05-30 ENCOUNTER — Encounter: Payer: Self-pay | Admitting: Internal Medicine

## 2017-05-30 ENCOUNTER — Other Ambulatory Visit: Payer: Self-pay

## 2017-05-30 ENCOUNTER — Ambulatory Visit (INDEPENDENT_AMBULATORY_CARE_PROVIDER_SITE_OTHER): Payer: Medicaid Other | Admitting: Internal Medicine

## 2017-05-30 VITALS — BP 128/68 | HR 63 | Temp 97.8°F | Ht 72.0 in | Wt 313.4 lb

## 2017-05-30 DIAGNOSIS — R911 Solitary pulmonary nodule: Secondary | ICD-10-CM

## 2017-05-30 DIAGNOSIS — Z9889 Other specified postprocedural states: Secondary | ICD-10-CM | POA: Diagnosis not present

## 2017-05-30 DIAGNOSIS — R079 Chest pain, unspecified: Secondary | ICD-10-CM

## 2017-05-30 DIAGNOSIS — Z1211 Encounter for screening for malignant neoplasm of colon: Secondary | ICD-10-CM

## 2017-05-30 DIAGNOSIS — M7989 Other specified soft tissue disorders: Secondary | ICD-10-CM | POA: Diagnosis not present

## 2017-05-30 DIAGNOSIS — R200 Anesthesia of skin: Secondary | ICD-10-CM | POA: Diagnosis not present

## 2017-05-30 DIAGNOSIS — Z8719 Personal history of other diseases of the digestive system: Secondary | ICD-10-CM | POA: Diagnosis not present

## 2017-05-30 DIAGNOSIS — H538 Other visual disturbances: Secondary | ICD-10-CM

## 2017-05-30 MED ORDER — OMEPRAZOLE 40 MG PO CPDR: 40.0000 mg | DELAYED_RELEASE_CAPSULE | Freq: Every day | ORAL | 1 refills | Status: DC

## 2017-05-30 NOTE — Progress Notes (Signed)
   CC: Chest pain   HPI:  [name] is a 57 y.o. male with PMH listed below who presents to clinic with a 55-month history of chest pain.  Please see problem based assessment and plan for further details.  Past Medical History:  Diagnosis Date  . Abscess   . Arthritis    all over  . Avascular necrosis of femoral head, left ([place])   . Cholelithiasis   . CKD (chronic kidney disease) stage 3, GFR 30-59 ml/min (HCC)    sees kidney Dr.  . Diverticulosis   . DVT (deep venous thrombosis) (HCC)    legs  . Dyspnea    when walking  . Dysrhythmia    remembers mother taking about having an irregular rhythm years when he was a child   . GERD (gastroesophageal reflux disease)   . HIV (human immunodeficiency virus infection) ([place])   . Morbid obesity ([place])    Review of Systems:   Review of Systems  Constitutional: Negative for chills, fever, malaise/fatigue and weight loss.  Respiratory: Negative for cough and shortness of breath.   Cardiovascular: Positive for leg swelling. Negative for chest pain and palpitations.  Gastrointestinal: Negative for abdominal pain, blood in stool, constipation, diarrhea, melena, nausea and vomiting.   Physical Exam:  Vitals:   05/30/17 1359  BP: 128/68  Pulse: 63  Temp: 97.8 F (36.6 C)  TempSrc: Oral  SpO2: 100%  Weight: (!) 313 lb 6.4 oz (142.2 kg)  Height: 6' (1.829 m)   General: Very pleasant male, obese, well-developed, in no acute distress Cardiac: regular rate and rhythm, nl S1/S2, no murmurs, rubs or gallops  Pulm: CTAB, no wheezes or crackles, no increased work of breathing  Abd: soft, NTND, bowel sounds present  Ext: warm and well perfused, 1+ pitting edema bilaterally that is chronic in nature and does look improved when compared to prior visits    Assessment & Plan:   See Encounters Tab for problem based charting.  Patient discussed with Dr. Lynnae January

## 2017-05-30 NOTE — Assessment & Plan Note (Signed)
Patient reports a 39-month history of pressure-like chest pain that is associated with food intake. States episodes only occur when eats or drinks and last 15-20 minutes. Belching relieves pain. He denies radiation of pain, SOB, diaphoresis, N/V, fever, chills, and cough. He does report LUE numbness, however this is chronic and secondary to hidradenitis excision of left axilla. Symptoms are not associated with exertion. He does not have a cardiac history. He does have a history of GERD that was treated with famotidine in the past. This was discontinues but patient unclear why.  On exam cardiac and lung exam are benign. Chest pain likely secondary to GERD given association with food.  Very low suspicion for cardiac etiology at this time given no personal history of heart disease and symptoms not consistent with angina.  Low suspicion for pulmonary etiology as well as he reports no respiratory symptoms.  Given symptoms are persistent and present on a daily basis will start with a higher dose of PPI as below.  - Start omeprazole 40 mg QD. Patient educated on appropriate intake of medication. Also discuss to expect gradual and not immediate relief of symptoms.  - Advised to call if no improvement in symptoms within 4-8 weeks

## 2017-05-30 NOTE — Assessment & Plan Note (Signed)
Patient has scheduled appt on 3/5 for colonoscopy.

## 2017-05-30 NOTE — Assessment & Plan Note (Signed)
CT abd/pelvis on 05/2016 with LLL pulmonary consolidation concerning for nodule. Recommended repeating CT in 1 year. CTA performed last month during ED visit with no nodule noted.

## 2017-05-30 NOTE — Patient Instructions (Addendum)
Mr. Springfield,   Your chest pain is likely from acid reflux. Please start taking omeprazole 40 mg (1 tablet) daily. I sent a prescription to your pharmacy for this medication. Please take 30-60 minutes prior to a meal.   Please call your GI doctor to make sure your colonoscopy is scheduled for 3/5.   I made a referral to the eye doctor for your eye exam. They will call you to schedule an appointment.   Please call us if you have any questions.

## 2017-06-03 ENCOUNTER — Telehealth: Payer: Self-pay | Admitting: *Deleted

## 2017-06-03 ENCOUNTER — Ambulatory Visit: Payer: Medicaid Other | Admitting: Physician Assistant

## 2017-06-03 ENCOUNTER — Encounter: Payer: Self-pay | Admitting: Physician Assistant

## 2017-06-03 VITALS — BP 138/82 | HR 76 | Ht 72.0 in | Wt 313.8 lb

## 2017-06-03 DIAGNOSIS — Z7901 Long term (current) use of anticoagulants: Secondary | ICD-10-CM | POA: Diagnosis not present

## 2017-06-03 DIAGNOSIS — Z1211 Encounter for screening for malignant neoplasm of colon: Secondary | ICD-10-CM

## 2017-06-03 NOTE — Telephone Encounter (Signed)
  06/03/2017   RE: NISHANT SCHRECENGOST DOB: 07-29-1960 MRN: [id number]   Dear Dr. Welford Roche,    We have scheduled the above patient for an endoscopic procedure. Our records show that he is on anticoagulation therapy.   Please advise as to how long the patient may come off his therapy of Xarelto prior to the procedure, which is scheduled for 06-16-2017.  Please  route the Xarelto clearance instructions to [facility] CMA.   Sincerely,    Amy Esterwood PA-C

## 2017-06-03 NOTE — Progress Notes (Addendum)
Subjective:    Patient ID: Allen Espinoza, male    DOB: Jul 02, 1960, 57 y.o.   MRN: [id number]  HPI Allen Espinoza is a pleasant 57 year old African-American male, who was initially seen in our office in November 2018 when he was referred by his PCP Dr. Isac Sarna for screening colonoscopy. Patient had an acute DVT in September 2018 which was recurrent, and had just started on Xarelto. Decision was made to delay colonoscopy so that his anticoagulation would not need to be interrupted at that time. Patient comes back in today to discuss proceeding with colonoscopy. He says he has  been doing well. He does has some ongoing swelling in the left lower extremity. He has an appointment to see vascular surgery later this month. He also has an IVC filter in  place, which had been placed remotely. He has no current GI complaints. He does have history of iron deficiency which has been felt in the past to be secondary to chronic oozing from Hidradenitis.Marland Kitchen He has history of recurrent DVTs, cholelithiasis, chronic kidney disease stage III and HIV for which he is on therapy. He is morbidly obese with BMI of 40. No prior GI evaluation, family history negative for colon cancer and polyps. Review of Systems Pertinent positive and negative review of systems were noted in the above HPI section.  All other review of systems was otherwise negative.  Outpatient Encounter Medications as of 06/03/2017  Medication Sig  . bictegravir-emtricitabine-tenofovir AF (BIKTARVY) 50-200-25 MG TABS tablet Take 1 tablet daily by mouth.  . levofloxacin (LEVAQUIN) 750 MG tablet Take 750 mg by mouth daily.  Marland Kitchen omeprazole (PRILOSEC) 40 MG capsule Take 1 capsule (40 mg total) by mouth daily.  . rivaroxaban (XARELTO) 20 MG TABS tablet Take 1 tablet (20 mg total) by mouth daily with supper.  . [DISCONTINUED] metroNIDAZOLE (FLAGYL) 250 MG tablet Take 250 mg by mouth 3 (three) times daily.   No facility-administered encounter medications on file  as of 06/03/2017.    Allergies  Allergen Reactions  . Sulfa Antibiotics Other (See Comments)    Low blood pressure  . Vancomycin Itching    Give with benadryl   Patient Active Problem List   Diagnosis Date Noted  . Colon cancer screening 02/03/2017  . Status post total replacement of left hip 11/29/2016  . Unilateral primary osteoarthritis, left hip 09/16/2016  . Edema 08/20/2016  . 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 Morbid (severe) obesity due to excess calories ([place]) 08/19/2016  . Solitary pulmonary nodule 05/06/2016  . Hidradenitis suppurativa 04/27/2016  . Chest pain 04/26/2016  . Avascular necrosis of bone of hip, left ([place]) 03/19/2016  . Human immunodeficiency virus (HIV) disease ([place]) 03/18/2016  . Chronic kidney disease 03/18/2016  . Encounter for immunization 03/18/2016  . Cholelithiasis 03/18/2016  . Diverticulosis of colon without hemorrhage 03/18/2016   Social History   Socioeconomic History  . Marital status: Single    Spouse name: Not on file  . Number of children: 2  . Years of education: Not on file  . Highest education level: Not on file  Social Needs  . Financial resource strain: Not on file  . Food insecurity - worry: Not on file  . Food insecurity - inability: Not on file  . Transportation needs - medical: Not on file  . Transportation needs - non-medical: Not on file  Occupational History  . Not on file  Tobacco Use  . Smoking status:  Never Smoker  . Smokeless tobacco: Never Used  Substance and Sexual Activity  . Alcohol use: No    Comment: prior  . Drug use: No    Comment: prior cocaine use, last 2005  . Sexual activity: Yes    Partners: Female    Birth control/protection: Condom  Other Topics Concern  . Not on file  Social History Narrative  . Not on file    Mr. Mcree family history includes Bone cancer in his maternal aunt; Breast cancer in his maternal aunt; Heart attack in  his father and mother; Prostate cancer in his brother.      Objective:    Vitals:   06/03/17 0846  BP: 138/82  Pulse: 76    Physical Exam; well-developed African-American male in no acute distress, pleasant blood pressure 138/82, pulse 76, weight 313, BMI 42.5. HEENT; nontraumatic normocephalic EOMI PERRLA sclera anicteric, Cardiovascular; regular rate and rhythm with S1-S2 no murmur rub or gallop, Pulmonary ;clear bilaterally, Abdomen ;obese, soft nondistended nontender bowel sounds are active there is no palpable mass or hepatosplenomegaly, Rectal; exam not done, Extremities; he does have some chronic swelling of the left lower extremity, Neuropsych; mood and affect appropriate       Assessment & Plan:   #61 57 year old African-American male referred for colon cancer screening. He is currently asymptomatic and average risk for colon cancer #2 chronic anticoagulation-on Xarelto #3 history of recurrent DVTs, patient had acute DVT September 2018 while he was off of Xarelto for a week or so to undergo hip surgery #4  chronic kidney disease stage III #5 HIV-on therapy #6 cholelithiasis #7 morbid obesity-BMI 40  Plan; Patient will be scheduled for colonoscopy with Dr. Carlean Purl. Procedure was discussed in detail with patient including indications risks and benefits and he is agreeable to proceed. Patient will need to hold Xarelto for 24 hours prior to colonoscopy. We will communicate with his PCP Dr. Frederico Hamman Laurin Coder to assure this is reasonable for this patient.   Amy S Esterwood PA-C 06/03/2017   Cc: Welford Roche,*

## 2017-06-03 NOTE — Progress Notes (Signed)
Internal Medicine Clinic Attending  Case discussed with Dr. Santos-Sanchez at the time of the visit.  We reviewed the resident's history and exam and pertinent patient test results.  I agree with the assessment, diagnosis, and plan of care documented in the resident's note.    

## 2017-06-03 NOTE — Patient Instructions (Signed)
You have been scheduled for a colonoscopy. Please follow written instructions given to you at your visit today.  Please pick up your prep supplies at the pharmacy within the next 1-3 days. If you use inhalers (even only as needed), please bring them with you on the day of your procedure. Your physician has requested that you go to www.startemmi.com and enter the access code given to you at your visit today. This web site gives a general overview about your procedure. However, you should still follow specific instructions given to you by our office regarding your preparation for the procedure.  If you are age 43 or younger, your body mass index should be between 19-25. Your Body mass index is 42.56 kg/m. If this is out of the aformentioned range listed, please consider follow up with your Primary Care Provider.

## 2017-06-04 MED ORDER — ADALIMUMAB 80 MG/0.8 ML SUBCUTANEOUS PEN KIT: kit | 0 refills | 0 days | Status: AC

## 2017-06-04 MED ORDER — ADALIMUMAB PEN CITRATE FREE 40 MG/0.4 ML: each | 11 refills | 0 days | Status: AC

## 2017-06-04 MED ORDER — METRONIDAZOLE 250 MG TABLET
ORAL_TABLET | Freq: Three times a day (TID) | ORAL | 1 refills | 0.00000 days | Status: CP
Start: 2017-06-04 — End: 2017-08-20

## 2017-06-04 MED ORDER — ADALIMUMAB 80 MG/0.8 ML SUBCUTANEOUS PEN KIT
PACK | 0 refills | 0 days | Status: CP
Start: 2017-06-04 — End: 2017-06-04

## 2017-06-04 MED ORDER — ADALIMUMAB PEN CITRATE FREE 40 MG/0.4 ML: pen | 11 refills | 0 days

## 2017-06-04 MED ORDER — ADALIMUMAB PEN CITRATE FREE 40 MG/0.4 ML
SUBCUTANEOUS | 11 refills | 0.00000 days | Status: CP
Start: 2017-06-04 — End: 2018-05-05

## 2017-06-05 NOTE — Telephone Encounter (Signed)
Chart reviewed, patient has never been seen by our cardiology service. She is on Xarelto for recurrent DVT, Xarelto was prescribed by Dr. Frederico Hamman. Please check with Dr. Frederico Hamman regarding how long to hold Xarelto.

## 2017-06-05 NOTE — Unmapped (Signed)
PA approved for Humira(starter and maintenance dose) from 06/05/17 to 07/31/17 for $3

## 2017-06-05 NOTE — Unmapped (Signed)
Per test claim for HUMIRA at the [facility], patient needs Medication Assistance Program for Prior Authorization.

## 2017-06-06 ENCOUNTER — Telehealth: Payer: Self-pay | Admitting: *Deleted

## 2017-06-06 NOTE — Telephone Encounter (Signed)
  06/03/2017   RE: UCHENNA RAPPAPORT DOB: [birth year] MRN: [id number]   Dear Dr. Welford Roche,    We have scheduled the above patient for an endoscopic procedure. Our records show that he is on anticoagulation therapy.   Please advise as to how long the patient may come off his therapy of Xarelto prior to the procedure, which is scheduled for 06-16-2017.  Please  route the Xarelto clearance instructions to [facility] CMA.   Sincerely,    Amy Esterwood PA-C

## 2017-06-09 MED ORDER — EMPTY CONTAINER
2 refills | 0 days
Start: 2017-06-09 — End: 2018-06-09

## 2017-06-09 MED FILL — SHARPS KIT/NA/MISC: SHARPS KIT/NA/MISC | 120 days supply | Qty: 1 | Fill #0

## 2017-06-09 MED FILL — HUMIRA PEN-CD/UC/HS STARTER/*CITRATE FREE/PNKT: HUMIRA PEN-CD/UC/HS STARTER/*CITRATE FREE/PNKT | 28 days supply | Qty: 3 | Fill #0

## 2017-06-09 NOTE — Unmapped (Signed)
[facility]   Patient Onboarding/Medication Counseling    [name] is a 57 y.o. male with hidradenitis who I am counseling today on initiation of therapy.    Medication: Humira Pens, Sharps container    Verified patient's date of birth / HIPAA.      Education Provided: ??    Dose/Administration discussed: 2 pens (160 mg) day 1, 1 pen (80 mg) day 15, then 1 pen (40 mg) every week beginning day 29. This medication should be taken  without regard to food.  Stressed the importance of taking medication as prescribed and to contact provider if that changes at any time.  Discussed missed dose instructions.    Storage requirements: this medicine should be stored in the refrigerator.     Side effects / precautions discussed: Discussed common side effects, including injection site reaction, risk of infection. If patient experiences fever/chills, they need to call the doctor.  Patient will receive a drug information handout with shipment.    Handling precautions / disposal reviewed:  Patient will dispose of needles in a sharps container or empty laundry detergent bottle.    Drug Interactions: other medications reviewed and up to date in Epic.  No drug interactions identified.    Comorbidities/Allergies: reviewed and up to date in Epic.    Verified therapy is appropriate and should continue      Delivery Information    Medication Assistance provided: Prior Authorization    Anticipated copay of $3 reviewed with patient. Verified delivery address in FSI and reviewed medication storage requirement.    Scheduled delivery date: Tues, March 12    Explained that we ship using UPS or courier and this shipment will not require a signature.      Explained the services we provide at [facility] and that each month we would call to set up refills.  Stressed importance of returning phone calls so that we could ensure they receive their medications in time each month.  Informed patient that we should be setting up refills 7-10 days prior to when they will run out of medication.  Informed patient that welcome packet will be sent.      Patient verbalized understanding of the above information as well as how to contact the pharmacy at [phone] option 4 with any questions/concerns.  The pharmacy is open Monday through Friday 8:30am-4:30pm.  A pharmacist is available 24/7 via pager to answer any clinical questions they may have.        Patient Specific Needs      ? Patient has no physical, cognitive, or cultural barriers.    ? Patient prefers to have medications discussed with  Patient     ? Patient is able to read and understand education materials at a high school level or above.    ? Patient's primary language is  English           Presenter, broadcasting  [facility] Specialty Pharmacist

## 2017-06-12 NOTE — Telephone Encounter (Signed)
Hi,  I apologize for the delayed response. Patient should not have any elective procedures until after 07/30/2017. His most recent DVT was only 4.5 months ago and it is not recommended to hold Xarelto at any time during the first 6 months after a DVT. He should rescheduled his procedure for after 07/30/2017. At that time he can hold his Xarelto 48 hours prior to colonoscopy and resume it on day of colonoscopy if performed early in the morning or the next day if performed later in the day. Please let me know if you have any questions.

## 2017-06-13 DIAGNOSIS — H5703 Miosis: Secondary | ICD-10-CM | POA: Diagnosis not present

## 2017-06-13 DIAGNOSIS — H5200 Hypermetropia, unspecified eye: Secondary | ICD-10-CM | POA: Diagnosis not present

## 2017-06-13 DIAGNOSIS — H2513 Age-related nuclear cataract, bilateral: Secondary | ICD-10-CM | POA: Diagnosis not present

## 2017-06-13 DIAGNOSIS — H40013 Open angle with borderline findings, low risk, bilateral: Secondary | ICD-10-CM | POA: Diagnosis not present

## 2017-06-13 NOTE — Telephone Encounter (Signed)
Called the patient to advise we are cancelling his colonoscopy for Monday 06-16-2017.  Dr. Welford Roche suggests he waits to have his colon after 07-30-2017.  The patient was on his way to another appointment today and asked if he could call me back. I told him to ask for me.

## 2017-06-15 MED FILL — HUMIRA PEN *NO CITRATE*/40MG/0.4ML/PNKT: HUMIRA PEN *NO CITRATE*/40MG/0.4ML/PNKT | 28 days supply | Qty: 4 | Fill #0

## 2017-06-16 ENCOUNTER — Encounter: Payer: Medicaid Other | Admitting: Internal Medicine

## 2017-06-17 NOTE — Telephone Encounter (Signed)
Will call patient to reschedule  Procedures for after 07-30-2017. If patient doesn't call me by April 1st I will call him.

## 2017-06-18 ENCOUNTER — Telehealth: Payer: Self-pay | Admitting: Internal Medicine

## 2017-06-20 ENCOUNTER — Encounter: Payer: Self-pay | Admitting: Vascular Surgery

## 2017-06-20 ENCOUNTER — Ambulatory Visit (INDEPENDENT_AMBULATORY_CARE_PROVIDER_SITE_OTHER): Payer: Medicaid Other | Admitting: Vascular Surgery

## 2017-06-20 VITALS — BP 140/86 | HR 54 | Resp 20 | Ht 72.0 in | Wt 312.0 lb

## 2017-06-20 DIAGNOSIS — I872 Venous insufficiency (chronic) (peripheral): Secondary | ICD-10-CM
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
## 2017-06-23 ENCOUNTER — Other Ambulatory Visit: Payer: Self-pay

## 2017-06-23 DIAGNOSIS — I872 Venous insufficiency (chronic) (peripheral): Secondary | ICD-10-CM

## 2017-06-30 ENCOUNTER — Other Ambulatory Visit: Payer: Self-pay | Admitting: *Deleted

## 2017-07-02 NOTE — Unmapped (Signed)
Patient had trouble with initial injection, but we decided to fill 40 mg pens and he took two to equal 80 mg loading dose. Will start maintenance dosing on 05/10/17. He is doing well and has noticed some benefit in his skin already.    [facility] Refill and Clinical Coordination Note  Medication(s): [facility], DOB: 1960-05-02  Phone: [phone] (home) , Alternate phone contact: N/A  Shipping address: [address]  [address]  [place] [address]  Phone or address changes today?: No  All above HIPAA information verified.  Insurance changes? No    Completed refill and clinical call assessment today to schedule patient's medication shipment from the [facility] [phone]).      MEDICATION RECONCILIATION    Confirmed the medication and dosage are correct and have not changed: Yes, regimen is correct and unchanged.    Were there any changes to your medication(s) in the past month:  No, there are no changes reported at this time.    ADHERENCE    Is this medicine transplant or covered by Medicare Part B? No.    Did you miss any doses in the past 4 weeks? No missed doses reported.  Adherence counseling provided? Not needed     SIDE EFFECT MANAGEMENT    Are you tolerating your medication?:  Wallice reports tolerating the medication.  Side effect management discussed: None      Therapy is appropriate and should be continued.    Evidence of clinical benefit: See Epic note from [facility]/ not seen since starting      FINANCIAL/SHIPPING    Delivery Scheduled: Yes, Expected medication delivery date: Tues, April 16   Additional medications refilled: No additional medications/refills needed at this time.    The patient will receive an FSI print out for each medication shipped and additional FDA Medication Guides as required.  Patient education from [facility] or [name] may also be included in the shipment.    Soma did not have any additional questions at this time.    Delivery address validated in FSI scheduling system: Yes, address listed above is correct.      We will follow up with patient monthly for standard refill processing and delivery.      Thank you,  Tawanna Solo [facility] Specialty Pharmacist

## 2017-07-04 ENCOUNTER — Telehealth: Payer: Self-pay | Admitting: *Deleted

## 2017-07-04 ENCOUNTER — Other Ambulatory Visit: Payer: Self-pay | Admitting: *Deleted

## 2017-07-04 NOTE — Telephone Encounter (Signed)
Mailed out to the patient colonoscopy instructions. Date of colonoscopy is 08-06-2017.

## 2017-07-04 NOTE — Telephone Encounter (Signed)
Mailed out today 07-07-2017 the colonoscopy instruc

## 2017-07-07 ENCOUNTER — Telehealth: Payer: Self-pay | Admitting: *Deleted

## 2017-07-07 NOTE — Telephone Encounter (Signed)
Regarding the anticoagulation questions I sent this letter to Dr. Welford Roche. She did respond to me on 06-12-2017.  She did want him to reschedule his original colonoscopy for after 07-30-2017.  She also sated that at the time he can hold his Xarelto 48 hours prior to the colonoscopy and resume it after the procedure on the day of the colonoscopy if performed early in the morning or the next day if performed later in the day.

## 2017-07-15 MED FILL — HUMIRA PEN *NO CITRATE*/40MG/0.4ML/PNKT: HUMIRA PEN *NO CITRATE*/40MG/0.4ML/PNKT | 28 days supply | Qty: 4 | Fill #1

## 2017-07-17 ENCOUNTER — Encounter: Admit: 2017-07-17 | Discharge: 2017-07-18 | Payer: BLUE CROSS/BLUE SHIELD

## 2017-07-17 DIAGNOSIS — L732 Hidradenitis suppurativa: Principal | ICD-10-CM

## 2017-07-17 NOTE — Unmapped (Signed)
ASSESSMENT/PLAN:  Hidradenitis Suppurativa;     Severe Hurley 3 HS scrotal, inguinal and perirenal; improving on Humira    We discussed the typical natural history, pathogenesis, treatment options, and expected course as well as the relapsing and sometimes recalcitrant nature of the disease.      In the short term will start antibiotic more directed toward HS control.  Will have to avoid clind/rif because of interaction with rifampin and his ART.  Discussed common and rare side effect including risk of tendon rupture.     -Ultimately, will likely benefit from surgical treatment however he would like to hold for now because of how much better his QOL is now and focus on medical management alone.  Could consider stage unroofing vs referral to urology.     Screening;A1C wnl     Humira continue 40mg  [place] qweek  for treatment of hidradenitis.  Discussed risks of tuberculosis, other uncommon infections, theoretical risk of malignancies, and other uncommon side effects.    Hx well controlled HIV; ID physician Dr. Lilli Light aware and agrees with Humira    --for 1 more month  Continue  metroNIDAZOLE (FLAGYL) 250 MG tablet; Take 1 tablet (250 mg total) by mouth Three (3) times a day.      RTC: 2-3 m     Discussed with Dr. Janyth Contes who agrees with assessment and plan    SUBJECTIVE:    CC: Hidradenitis Suppurativa    Dustin Prince is a 57 y.o. male  who is seen for consultation today at the request of Brunilda Payor, MD for evaluation of hidradenitis suppurativa.    Self-reported severity (0-5): 2  VAS pain today: 1  VAS average pain for the last month: 2  Requiring pain medication? No.  If so, what type/frequency? none  How often in pain?  weekly  Level of odor (0-5): 1  Level of itching (0-5): 3  Dressing changes needed for drainage: weekly  How much drainage: some  Flare in the last month (Y/N)? no  How long ago was the last flare? in last 6 months  Developing new lesions? None recently  Number of inflammatory lesions montly: 1  DLQI: 10  Current treatment: Hurmira and metronidazole     How helpful is the current treatment in managing the following aspects of your disease?  Not at all helpful Somewhat helpful Very helpful   Pain   x   Decreasing length of flares   x   Decreasing new lesions   x   Drainage   x   Decreasing frequency of flares   x   Decreasing severity of flares   x   Odor   x     Disease course:  Year when symptoms first noticed: 2008  Year of diagnosis: 2008  Who diagnosed you? Dermatologist  Location of first symptoms: groin, axillae, inner thighs and buttocks  Typical involved areas include: groin, axillae, inner thighs and buttocks  Typical number of inflammatory lesions each month at baseline (from first visit): 3-5      FH:      Patient Mother Father Son Daughter Brother Sister Maternal Grandmother Maternal Grandfather Paternal Grandmother Paternal Grandfather Maternal Aunt Maternal Uncle Paternal Aunt Paternal Uncle Other:   Derm Hidradenitis Suppurativa  x        x                            Pilonidal sinus  Acne                                      Dissecting cellulitis(Scalp)                                     Eczema                                     Allergies  x                  Rheum Joint pains   x                                   SAPHO                                     Pyoderma gangrenosum                                     Back pain  x                                  Auto-immune disease   x                                  Asthma                    Endo Polycystic ovarian syndrome                                     Thyroid disease                    Vitamin D deficiency                   Psych Anxiety                                     Depression                                     Dementia                                     Suicidal thoughts*                                   Cardio Hypertension  High cholesterol Heart attack    x x                               Stroke                                     Hem-onc Cancer  ______________                                    Anemia  x                  GI IBD (UC/Crohn's)                                   ID HIV  x                   Syphilis                     Other                         History of well-controlled HIV antiretroviral on ART.  See Dr. Ninetta Lights in [place].      Social History:  Current or former smoker? never  Amount smoking: n/a  How many years: n/a  ED visits in the last 5 years? 6-10  Difficulty affording medications? most of the time  Marital Status: in relationship  Living with some one? Yes.    Prior treatments:  Topical: topical ABX  Systemic: clidnamycin; doxy (not very helpful),   Past surgical procedures: large WLE left axilla c/b dehisence   Past laser procedures: none          ROS: the balance of 10 systems is negative unless otherwise documented      OBJECTIVE:   Gen: Well-appearing patient, appropriate, interactive, in no acute distress  Skin: Examination of the scalp, face, neck, chest, back, abdomen, bilateral upper and lower extremities, hands, palms, soles, nails, buttocks, and external genitalia performed today and pertinent for:     interconnected non-draining sinuses bilateral inner thighs, and inguinal, scrotum, perianal.   -old surgical scar left axilla well healed   -non inflamed nodules and scar bilateral inner thighs      AN count (total sum of abscess and inflammatory nodule): 1  Pilonidal sinus (Y/N, or previously treated)? No.

## 2017-07-17 NOTE — Unmapped (Signed)
Hidradentis Suppurativa (pronounced ???high-drad-en-eye-tis/sup-your-uh-tee-vah???) is a chronic disease of hair follicles.  The lesions occur most commonly on areas of skin-to-skin contact: under the arms (axillary area), in the groin, around the buttocks, in the region around the anus and genitals, and on the skin between and under the breasts. In women, the underarms, groin, and breast areas are most commonly affected. Men most often have HS lesions around the anus and under the arms and may also have HS at the back of the neck and behind and around the ears.    What does HS look and feel like?   The first thing that someone with HS notices is a tender, raised, red bump that looks like an under-the-skin pimple or boil. Sometimes HS lesions have two or more ???heads.???  In mild disease only an occasional boil or abscess may occur, but in more active disease there can be many new lesions every month.  Some abscesses can become larger and may open and drain pus.  Bleeding and increased odor can also occur. In severe disease, deeper abscesses develop and may connect with each other under the skin to form tunnel-like tracts (sinuses, fistulas).  These may drain constantly, or may temporarily improve and then usually begin draining again over time.  In people who have had sinus tracts for some time, scars form that feel like ropes under the skin. In the very worst cases, networks of sinus tracts can form deeper in the body, including the muscle and other tissues. Many people with severe HS have scars that can limit their ability to freely move their arms or legs, though this is very unlikely for most patients.     Clinicians usually classify or ???grade??? HS using the [facility] staging system according to the severity of the disease for each body location:   Charco stage I: one or more abscesses are present, but no sinus tracts have formed and no scars have developed   Doreene Adas stage II: one or more abscesses are present that resolve and recur; on sinus tract can be present and scarring is seen   Doreene Adas stage III: many abscesses and more than one sinus tract is present with extensive scars.    What causes HS?  The cause of HS is not completely understood.  It seems to be a disorder of hair follicles and often many family members are affected so genetics probably play a strong role.  Bacteria are often present and may make the disease worse, but infection does not seem to be the main cause. Hormones are also likely play a role since the condition typically starts around puberty when hair follicles under the arms and in the groin start to change.  It can sometimes flare with menstrual cycles in women as well.  In most cases it lasts for decades and starts to improve to some extent in the late 30s and 40s as long as many fistulas have not already formed.  Women are three times more likely than men to develop HS.    Other factors are known to contribute to HS flaring or becoming worse, though they are likely not the main causes. The factors most commonly associated with HS include:   Cigarette smoking - this is very highly linked.  Stopping smoking will likely not cure the disease, but likely is helpful in reducing how much and how often it flares.   Obesity - HS may occur even in people that are not overweight, but it is much more common  in patients that are.  There is some evidence that losing weight and eating a diet low in sugars and fats may be helpful in improving hidradenitis, though this is not helpful for everyone.  Working with a nutritionist may be an important way to help with this and is something your physician can help coordinate    Hidradenitis is not contagious.  It is not caused by a problem with personal hygiene or any other activity or behavior of those with the disease.    How can your doctor help you treat your hidradenitis?  Clinicians use both medication and surgery to treat HS. The choice of treatment--or combination of treatments--is made according to an individual patient???s needs. Clinicians consider several factors in determining the most appropriate plan for therapy:   Severity of disease - medications and some laser treatments are usually able to control disease best when fistulas are not present.  Fistulas typically require surgery.   Extent and location of disease   Chronicity (how often the lesions recur)    A number of different surgical methods have been developed that are useful for certain patients under particular circumstances. These can be done with local numbing and healing at home for some areas when disease is not too extensive with relatively brief recovery times.  In more extensive disease there may be a need for larger excisions under general anesthesia with healing time in the hospital and prolonged recovery periods for better disease control.      In addition, many medical treatments have been tried--some with more success than others. No medication is effective for all patients, and you and your doctor may have to try several different agents or combinations of agents before you find the treatment plan that works best for you.  The goals of therapy with medications that are either topical (used on the skin) or systemic (taken by mouth) are:  1. to clear the lesions or at least reduce their number and extent, and  2. to prevent new lesions from forming.  3. To reduce pain, drainage, and odor  Some of the types of medications commonly used are antibacterial skin washes and the topical antibiotics to prevent secondary infections and corticosteroid injections into the lesions to reduce inflammation.     Other medications that may be used include retinoids (similar to Accutane), drugs that effect how hormones and hair follicles interact, drugs that affect your immune system (such as methotrexate, adalimumab/Humira, and Remicaid/infliximab), steroids, and oral antibiotics.    Lasers that destroy hair follicles can also be helpful since they reduce the hair follicles that cause the problems.  Multiple treatments are typically required over time and there is some discomfort associated with treatment, but it is typically very fast and well-tolerated.    It is very important to realize that hidradenitis cannot be completely cured with any single medication or surgical procedure.  It is a disease that can be very stubborn and difficult to control, but with good treatment a lot of improvement and sometimes temporary remissions can be obtained. Poorly controlled disease can cause more fistulas to form and make managing the disease much more difficult over time so it is important to seek care to reduce major flares.  Surgery can provide a long term cure in some areas, though the disease can start again or continue in nearby areas.  A dermatologist is often the best person to help coordinate disease treatment, and sometimes other surgeons, pain specialists, other specialists, and nutritionists may be  part of the treatment team.    What can you do to help your HS?  1. If your are a smoker, then stopping can probably be helpful.  Your dermatologist will be happy to refer you to some one who can help with this.  2. Follow a healthy diet and try to achieve a healthy weight  Some other self-help measures are:   Keep your skin cool and dry (becoming overheated and sweating can contribute to an HS flare)   To reduce the pain of cysts or nodules or to help them to drain, apply hot compresses or soak in hot water for 10 minutes at a time (use a clean washcloth or a teabag soaked in hot water)   For male patients, cotton underwear that does not have tight elastic in the groin can be helpful.  Boyshort, brief, or boxer style underwear may be a better option as friction on hair follicles in affected areas can be a major trigger in some patients.  These can be easily found on [place] or with some retailers.  Fruit of the Loom and Underworks are two brands that are sometimes recommended.    Finally, know that you are not alone. Coping with the pain and other symptoms of HS can be very difficult, so it may be helpful to connect with others who live with HS. Patient groups and networks can be sources of important information and support. Some internet resources for information and connections are provided below.  Resources for Information    The Hidradenitis Suppurativa Foundation: A nonprofit organized by a group of physicians interested in treating and advancing research in hidradenitis suppurativa    American Academy of Dermatology  ARanked.fi    [employer]  ElevatorPitchers.de.html  [employer]ORD: [employer]  https://www.rarediseases.org/rare-disease-information/rare-diseases/byID/358/viewAbstract  Trials of new medications for HS  https://www.clinicaltrials.gov

## 2017-08-05 NOTE — Unmapped (Signed)
[facility] Refill Coordination Note    Specialty Medication(s) to be Shipped:   Inflammatory Disorders: Humira    Other medication(s) to be shipped: N/A     [name], DOB: [birth year]  Phone: [phone] (home)   Shipping Address: [address]  [address]  [place] [address]    All above HIPAA information was verified with patient.     Completed refill call assessment today to schedule patient's medication shipment from the [facility] [phone]).       Specialty medication(s) and dose(s) confirmed: Regimen is correct and unchanged.   Changes to medications: Wilman reports no changes reported at this time.  Changes to insurance: No  Questions for the pharmacist: No    The patient will receive an FSI print out for each medication shipped and additional FDA Medication Guides as required.  Patient education from [place] or [name] may also be included in the shipment.    DISEASE-SPECIFIC INFORMATION        For Inflammatory disorders patients on injectable medications: Patient currently has 1 doses left.  Next injection is scheduled for next tuesday 08/12/17.    ADHERENCE     Medication Adherence    Patient reported X missed doses in the last month:  0  Specialty Medication:  HUMIRA  Patient is on additional specialty medications:  No  Patient is on more than two specialty medications:  No  Any gaps in refill history greater than 2 weeks in the last 3 months:  no  Demonstrates understanding of importance of adherence:  yes  Informant:  patient  Confirmed plan for next specialty medication refill:  delivery by pharmacy  Refills needed for supportive medications:  not needed          Refill Coordination    Has the Patients' Contact Information Changed:  No  Is the Shipping Address Different:  No           SHIPPING     Shipping address confirmed in FSI.     Delivery Scheduled: Yes, Expected medication delivery date: 08/13/17 via UPS or courier.     [name]   [facility] Specialty Technician

## 2017-08-06 ENCOUNTER — Encounter: Payer: Self-pay | Admitting: Internal Medicine

## 2017-08-06 ENCOUNTER — Ambulatory Visit (AMBULATORY_SURGERY_CENTER): Payer: Medicaid Other | Admitting: Internal Medicine

## 2017-08-06 ENCOUNTER — Other Ambulatory Visit: Payer: Self-pay

## 2017-08-06 VITALS — BP 141/65 | HR 46 | Temp 98.4°F | Resp 10 | Ht 72.0 in | Wt 313.0 lb

## 2017-08-06 DIAGNOSIS — Z1212 Encounter for screening for malignant neoplasm of rectum: Secondary | ICD-10-CM

## 2017-08-06 DIAGNOSIS — Z1211 Encounter for screening for malignant neoplasm of colon: Secondary | ICD-10-CM

## 2017-08-06 MED ORDER — SODIUM CHLORIDE 0.9 % IV SOLN: 500.0000 mL | Freq: Once | INTRAVENOUS | Status: DC

## 2017-08-06 NOTE — Op Note (Addendum)
[facility] Patient Name: Allen Espinoza Procedure Date: 08/06/2017 11:46 AM MRN: [id number] Endoscopist: Gatha Mayer , MD Age: 57 Referring MD:  Date of Birth: [birth year] Gender: Male Account #: [id number] Procedure:                Colonoscopy Indications:              Screening for colorectal malignant neoplasm, This                            is the patient's first colonoscopy Medicines:                Propofol per Anesthesia, Monitored Anesthesia Care Procedure:                Pre-Anesthesia Assessment:                           - Prior to the procedure, a History and Physical                            was performed, and patient medications and                            allergies were reviewed. The patient's tolerance of                            previous anesthesia was also reviewed. The risks                            and benefits of the procedure and the sedation                            options and risks were discussed with the patient.                            All questions were answered, and informed consent                            was obtained. Prior Anticoagulants: The patient                            last took Xarelto (rivaroxaban) 2 days prior to the                            procedure. ASA Grade Assessment: III - A patient                            with severe systemic disease. After reviewing the                            risks and benefits, the patient was deemed in                            satisfactory condition to undergo the procedure.  After obtaining informed consent, the colonoscope                            was passed under direct vision. Throughout the                            procedure, the patient's blood pressure, pulse, and                            oxygen saturations were monitored continuously. The                            Colonoscope was introduced through the anus and   advanced to the the cecum, identified by                            appendiceal orifice and ileocecal valve. The                            colonoscopy was performed without difficulty. The                            patient tolerated the procedure well. The quality                            of the bowel preparation was adequate. The bowel                            preparation used was Miralax. The appendiceal                            orifice and the rectum were photographed. Scope In: 12:07:33 PM Scope Out: 12:21:32 PM Scope Withdrawal Time: 0 hours 11 minutes 24 seconds  Total Procedure Duration: 0 hours 13 minutes 59 seconds  Findings:                 The perianal and digital rectal examinations were                            normal.                           The entire examined colon appeared normal on direct                            and retroflexion views. Complications:            No immediate complications. Estimated Blood Loss:     Estimated blood loss: none. Impression:               - The entire examined colon is normal on direct and                            retroflexion views.                           -  No specimens collected. Recommendation:           - Patient has a contact number available for                            emergencies. The signs and symptoms of potential                            delayed complications were discussed with the                            patient. Return to normal activities tomorrow.                            Written discharge instructions were provided to the                            patient.                           - Resume previous diet.                           - Continue present medications.                           - Repeat colonoscopy in 10 years for screening                            purposes.                           - Resume Xarelto (rivaroxaban) at prior dose today.                            [Management]. Gatha Mayer, MD 08/06/2017 12:24:51 PM This report has been signed electronically.

## 2017-08-06 NOTE — Progress Notes (Signed)
Report given to PACU, vss 

## 2017-08-06 NOTE — Progress Notes (Signed)
Per Dr. Carlean Purl, "I decompressed the colon well in the exam room.  Pt may not need to pass flatus."  Pt abdomen is soft and he denies discomfort.  Pt has not passed flatus.  I encouraged him to bear down and try to pass gas.  Maw  No problems noted in the recovery room. maw

## 2017-08-06 NOTE — Progress Notes (Signed)
Medicaid assited transportation.  Called at 12:40 and said pt will be ready for d/c at 13:00.  They are to call the recovery room when out front. maw

## 2017-08-06 NOTE — Progress Notes (Signed)
Pt's states no medical or surgical changes since previsit or office visit. 

## 2017-08-06 NOTE — Patient Instructions (Addendum)
The colonoscopy was normal.  Next routine colonoscopy or other screening test in 10 years - 2029  Restart Xarelto today please.  I appreciate the opportunity to care for you. Gatha Mayer, MD, FACG     YOU HAD AN ENDOSCOPIC PROCEDURE TODAY AT [facility]:   Refer to the procedure report that was given to you for any specific questions about what was found during the examination.  If the procedure report does not answer your questions, please call your gastroenterologist to clarify.  If you requested that your care partner not be given the details of your procedure findings, then the procedure report has been included in a sealed envelope for you to review at your convenience later.  YOU SHOULD EXPECT: Some feelings of bloating in the abdomen. Passage of more gas than usual.  Walking can help get rid of the air that was put into your GI tract during the procedure and reduce the bloating. If you had a lower endoscopy (such as a colonoscopy or flexible sigmoidoscopy) you may notice spotting of blood in your stool or on the toilet paper. If you underwent a bowel prep for your procedure, you may not have a normal bowel movement for a few days.  Please Note:  You might notice some irritation and congestion in your nose or some drainage.  This is from the oxygen used during your procedure.  There is no need for concern and it should clear up in a day or so.  SYMPTOMS TO REPORT IMMEDIATELY:   Following lower endoscopy (colonoscopy or flexible sigmoidoscopy):  Excessive amounts of blood in the stool  Significant tenderness or worsening of abdominal pains  Swelling of the abdomen that is new, acute  Fever of 100F or higher  For urgent or emergent issues, a gastroenterologist can be reached at any hour by calling [phone].   DIET:  We do recommend a small meal at first, but then you may proceed to your regular diet.  Drink plenty of fluids but you should avoid  alcoholic beverages for 24 hours.  ACTIVITY:  You should plan to take it easy for the rest of today and you should NOT DRIVE or use heavy machinery until tomorrow (because of the sedation medicines used during the test).    FOLLOW UP: Our staff will call the number listed on your records the next business day following your procedure to check on you and address any questions or concerns that you may have regarding the information given to you following your procedure. If we do not reach you, we will leave a message.  However, if you are feeling well and you are not experiencing any problems, there is no need to return our call.  We will assume that you have returned to your regular daily activities without incident.  If any biopsies were taken you will be contacted by phone or by letter within the next 1-3 weeks.  Please call us at [phone] if you have not heard about the biopsies in 3 weeks.    SIGNATURES/CONFIDENTIALITY: You and/or your care partner have signed paperwork which will be entered into your electronic medical record.  These signatures attest to the fact that that the information above on your After Visit Summary has been reviewed and is understood.  Full responsibility of the confidentiality of this discharge information lies with you and/or your care-partner.    Per Dr. Carlean Purl restart your Alveda Reasons today. You may resume your current medications  today. Repeat screening colonoscopy in 10 years for screening purposes. Please call if any questions or concerns.

## 2017-08-07 ENCOUNTER — Telehealth: Payer: Self-pay

## 2017-08-07 NOTE — Telephone Encounter (Signed)
  Follow up Call-  Call back number 08/06/2017  Post procedure Call Back phone  # [phone]  Permission to leave phone message Yes     Patient questions:  Do you have a fever, pain , or abdominal swelling? No. Pain Score  0 *  Have you tolerated food without any problems? Yes.    Have you been able to return to your normal activities? Yes.    Do you have any questions about your discharge instructions: Diet   No. Medications  No. Follow up visit  No.  Do you have questions or concerns about your Care? No.  Actions: * If pain score is 4 or above: No action needed, pain <4.

## 2017-08-12 MED FILL — HUMIRA PEN *NO CITRATE*/40MG/0.4ML/PNKT: HUMIRA PEN *NO CITRATE*/40MG/0.4ML/PNKT | 28 days supply | Qty: 4 | Fill #2

## 2017-08-20 MED ORDER — METRONIDAZOLE 250 MG TABLET
ORAL_TABLET | 0 refills | 0 days | Status: CP
Start: 2017-08-20 — End: 2017-10-06

## 2017-08-29 NOTE — Unmapped (Signed)
[facility] Refill Coordination Note    Specialty Medication(s) to be Shipped:   Inflammatory Disorders: Humira    Other medication(s) to be shipped: N/A     [name], DOB: 19-Mar-1961  Phone: [phone] (home)   Shipping Address: [address]  [address]  [place] [address]    All above HIPAA information was verified with patient.     Completed refill call assessment today to schedule patient's medication shipment from the [facility] [phone]).       Specialty medication(s) and dose(s) confirmed: Regimen is correct and unchanged.   Changes to medications: Ignacio reports no changes reported at this time.  Changes to insurance: No  Questions for the pharmacist: No    The patient will receive an FSI print out for each medication shipped and additional FDA Medication Guides as required.  Patient education from [place] or [name] may also be included in the shipment.    DISEASE-SPECIFIC INFORMATION        For Inflammatory disorders patients on injectable medications: Patient currently has 2 doses left.  Next injection is scheduled for 6/4 (NEW BOX ON 6/18).    ADHERENCE     Medication Adherence    Patient reported X missed doses in the last month:  0  Specialty Medication:  HUMIRA  Patient is on additional specialty medications:  No  Patient is on more than two specialty medications:  No  Any gaps in refill history greater than 2 weeks in the last 3 months:  no  Demonstrates understanding of importance of adherence:  yes  Informant:  patient  Reliability of informant:  reliable  Support network for adherence:  family member  Confirmed plan for next specialty medication refill:  delivery by pharmacy  Refills needed for supportive medications:  not needed          Refill Coordination    Has the Patients' Contact Information Changed:  No  Is the Shipping Address Different:  No           SHIPPING     Shipping address confirmed in FSI.     Delivery Scheduled: Yes, Expected medication delivery date: 09/05/17 via UPS or courier.     [name]   [facility] Specialty Technician

## 2017-09-02 ENCOUNTER — Encounter (HOSPITAL_COMMUNITY): Payer: Self-pay | Admitting: Emergency Medicine

## 2017-09-02 ENCOUNTER — Emergency Department (HOSPITAL_COMMUNITY): Payer: Medicaid Other

## 2017-09-02 ENCOUNTER — Other Ambulatory Visit: Payer: Self-pay

## 2017-09-02 ENCOUNTER — Emergency Department (HOSPITAL_COMMUNITY)
Admission: EM | Admit: 2017-09-02 | Discharge: 2017-09-02 | Disposition: A | Discharge status: Home / Self Care | Payer: Medicaid Other | Attending: Emergency Medicine | Admitting: Emergency Medicine

## 2017-09-02 DIAGNOSIS — R0602 Shortness of breath: Secondary | ICD-10-CM | POA: Diagnosis not present

## 2017-09-02 DIAGNOSIS — R062 Wheezing: Secondary | ICD-10-CM

## 2017-09-02 DIAGNOSIS — Z7901 Long term (current) use of anticoagulants: Secondary | ICD-10-CM | POA: Diagnosis not present

## 2017-09-02 DIAGNOSIS — Z79899 Other long term (current) drug therapy: Secondary | ICD-10-CM | POA: Diagnosis not present

## 2017-09-02 DIAGNOSIS — Z21 Asymptomatic human immunodeficiency virus [HIV] infection status: Secondary | ICD-10-CM | POA: Insufficient documentation

## 2017-09-02 DIAGNOSIS — N183 Chronic kidney disease, stage 3 (moderate): Secondary | ICD-10-CM | POA: Insufficient documentation

## 2017-09-02 DIAGNOSIS — R079 Chest pain, unspecified: Secondary | ICD-10-CM

## 2017-09-02 DIAGNOSIS — R072 Precordial pain: Secondary | ICD-10-CM | POA: Diagnosis not present

## 2017-09-02 DIAGNOSIS — R05 Cough: Secondary | ICD-10-CM | POA: Diagnosis not present

## 2017-09-02 LAB — COMPREHENSIVE METABOLIC PANEL
ALT: 23 U/L (ref 17–63)
ANION GAP: 7 (ref 5–15)
AST: 22 U/L (ref 15–41)
Albumin: 3.7 g/dL (ref 3.5–5.0)
Alkaline Phosphatase: 86 U/L (ref 38–126)
BUN: 16 mg/dL (ref 6–20)
CHLORIDE: 107 mmol/L (ref 101–111)
CO2: 27 mmol/L (ref 22–32)
CREATININE: 1.5 mg/dL — AB (ref 0.61–1.24)
Calcium: 8.9 mg/dL (ref 8.9–10.3)
GFR, EST AFRICAN AMERICAN: 58 mL/min — AB (ref >60)
GFR, EST NON AFRICAN AMERICAN: 50 mL/min — AB (ref >60)
Glucose, Bld: 104 mg/dL — ABNORMAL HIGH (ref 65–99)
POTASSIUM: 3.9 mmol/L (ref 3.5–5.1)
SODIUM: 141 mmol/L (ref 135–145)
Total Bilirubin: 0.7 mg/dL (ref 0.3–1.2)
Total Protein: 8.1 g/dL (ref 6.5–8.1)

## 2017-09-02 LAB — URINALYSIS, ROUTINE W REFLEX MICROSCOPIC
BILIRUBIN URINE: NEGATIVE
Bacteria, UA: NONE SEEN
Glucose, UA: NEGATIVE mg/dL
KETONES UR: 5 mg/dL — AB
Nitrite: NEGATIVE
Protein, ur: NEGATIVE mg/dL
SPECIFIC GRAVITY, URINE: 1.026 (ref 1.005–1.030)
pH: 5 (ref 5.0–8.0)

## 2017-09-02 LAB — LIPASE, BLOOD: Lipase: 27 U/L (ref 11–51)

## 2017-09-02 LAB — CBC WITH DIFFERENTIAL/PLATELET
Basophils Absolute: 0 10*3/uL (ref 0.0–0.1)
Basophils Relative: 0 %
EOS ABS: 2.2 10*3/uL — AB (ref 0.0–0.7)
EOS PCT: 21 %
HCT: 38.9 % — ABNORMAL LOW (ref 39.0–52.0)
Hemoglobin: 12.9 g/dL — ABNORMAL LOW (ref 13.0–17.0)
LYMPHS ABS: 3.7 10*3/uL (ref 0.7–4.0)
LYMPHS PCT: 37 %
MCH: 32.3 pg (ref 26.0–34.0)
MCHC: 33.2 g/dL (ref 30.0–36.0)
MCV: 97.5 fL (ref 78.0–100.0)
MONOS PCT: 5 %
Monocytes Absolute: 0.5 10*3/uL (ref 0.1–1.0)
Neutro Abs: 3.8 10*3/uL (ref 1.7–7.7)
Neutrophils Relative %: 37 %
PLATELETS: 198 10*3/uL (ref 150–400)
RBC: 3.99 MIL/uL — AB (ref 4.22–5.81)
RDW: 14.1 % (ref 11.5–15.5)
WBC: 10.2 10*3/uL (ref 4.0–10.5)

## 2017-09-02 LAB — I-STAT TROPONIN, ED: TROPONIN I, POC: 0 ng/mL (ref 0.00–0.08)

## 2017-09-02 LAB — BRAIN NATRIURETIC PEPTIDE: B NATRIURETIC PEPTIDE 5: 36.7 pg/mL (ref 0.0–100.0)

## 2017-09-02 LAB — I-STAT CG4 LACTIC ACID, ED: LACTIC ACID, VENOUS: 1.33 mmol/L (ref 0.5–1.9)

## 2017-09-02 IMAGING — CR DG CHEST 2V
2 series · 2 of 2 positions shown · non-contrast
Comparison: Radiographs [DATE].

CLINICAL DATA: Shortness of breath, cough.

EXAM:
CHEST - 2 VIEW

[w chest pa]
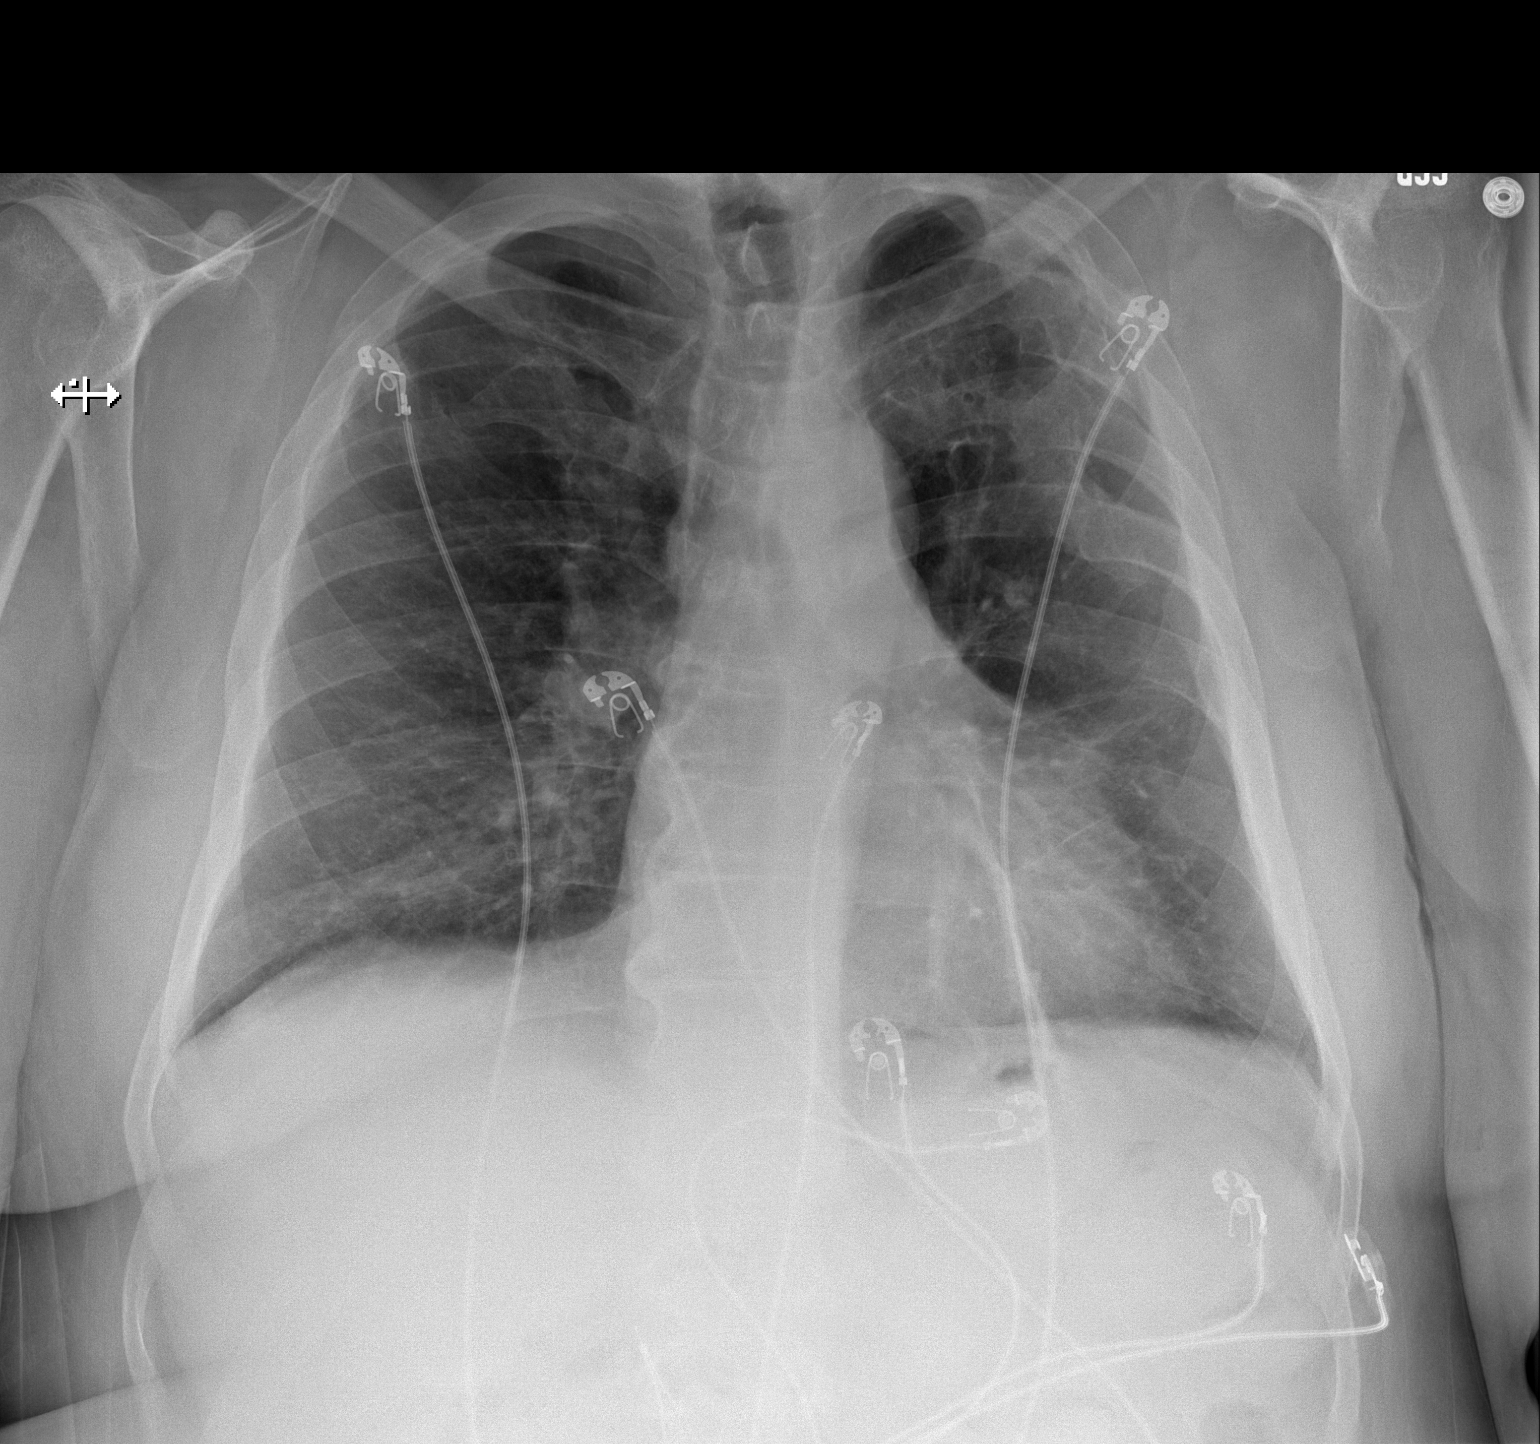

[w chest lat]
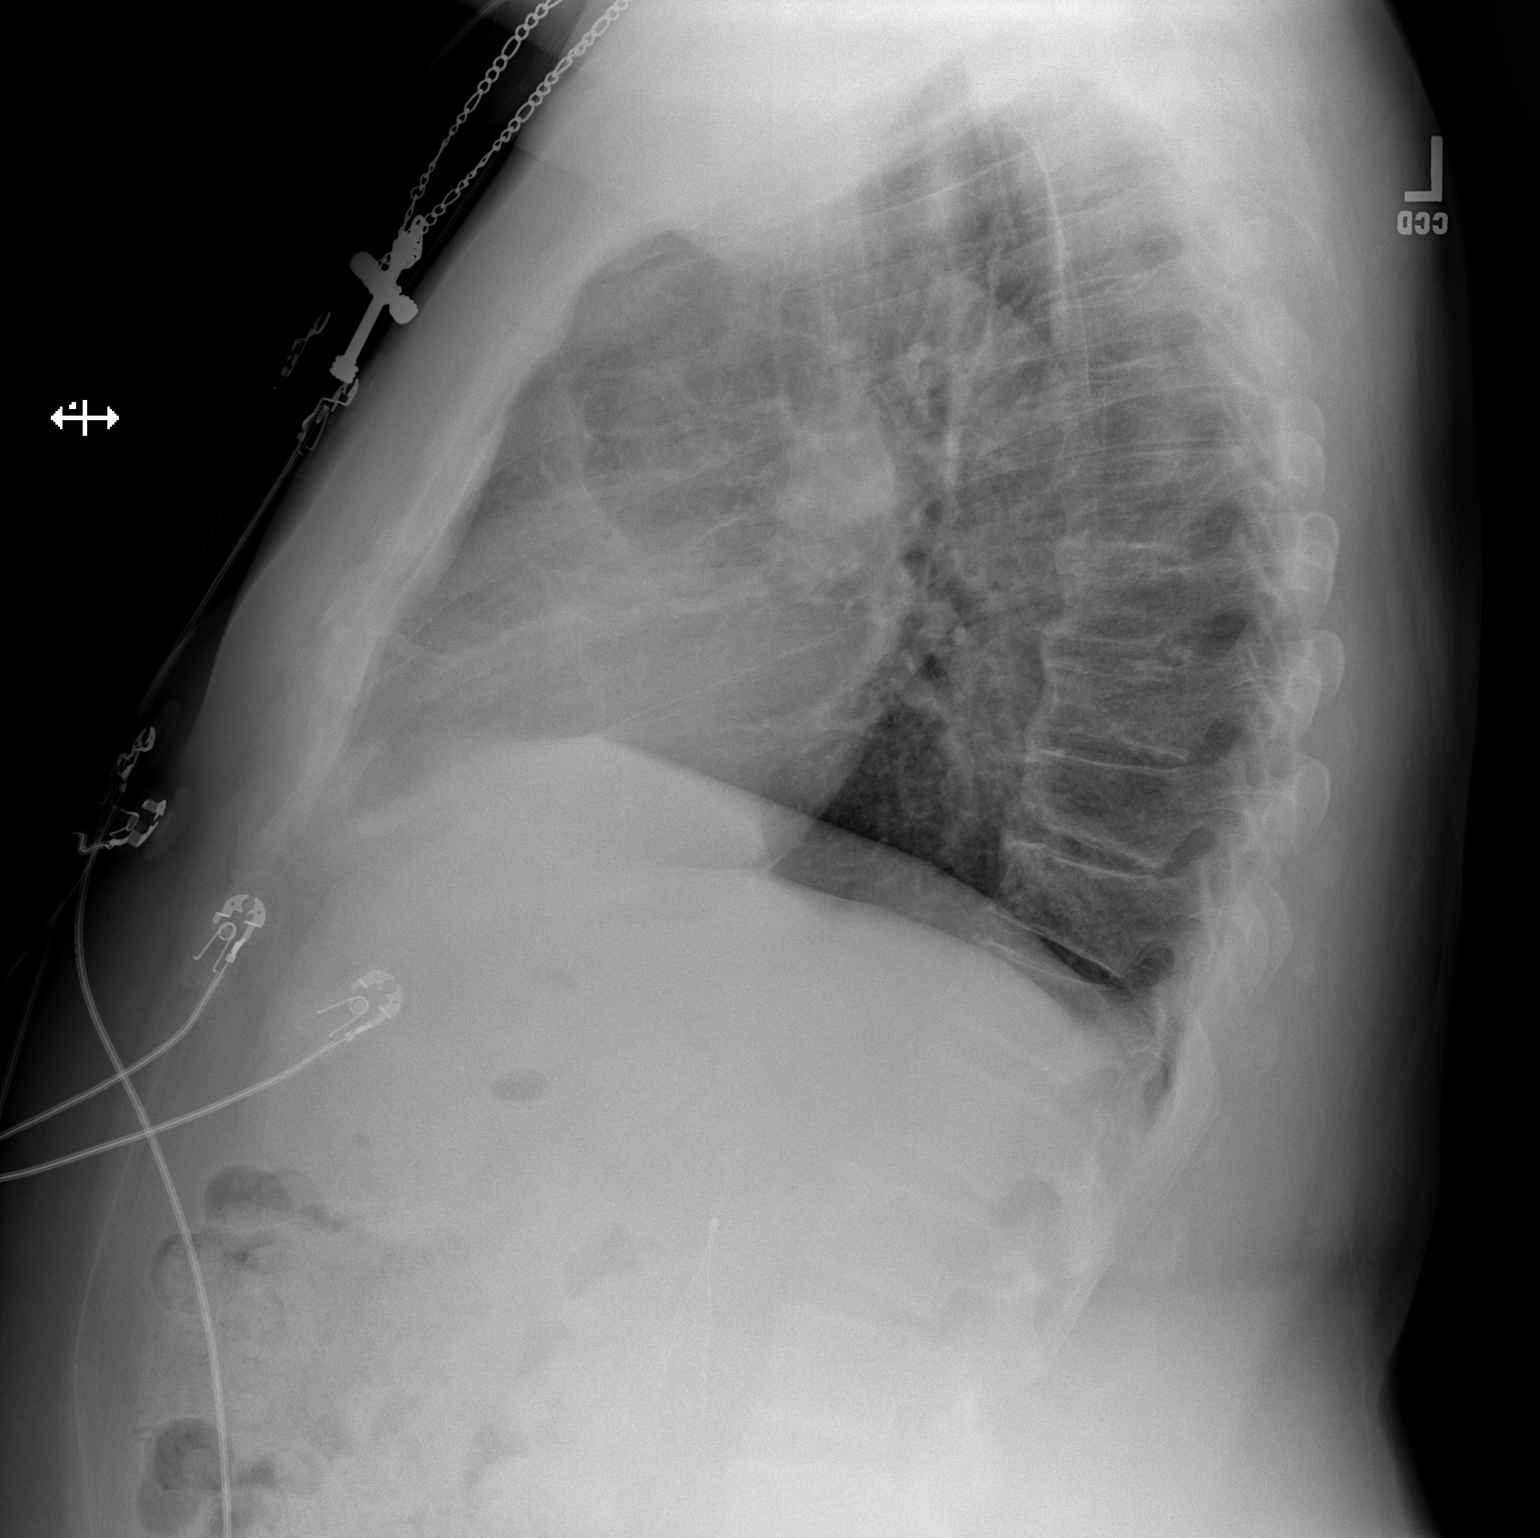

[2 of 2 positions shown; findings below may reference images not displayed]

FINDINGS: The heart size and mediastinal contours are within normal limits.
Both lungs are clear. No pneumothorax or pleural effusion is noted.
The visualized skeletal structures are unremarkable.
IMPRESSION: No active cardiopulmonary disease.

## 2017-09-02 IMAGING — CT CT ANGIO CHEST
2 of 6 series · 19 of 36 positions shown · IV contrast (ISOVUE)
Comparison: CT scan of [DATE].

CLINICAL DATA: Shortness of breath, chest pain.

EXAM:
CT ANGIOGRAPHY CHEST WITH CONTRAST
TECHNIQUE: Multidetector CT imaging of the chest was performed using the
standard protocol during bolus administration of intravenous
contrast. Multiplanar CT image reconstructions and MIPs were
obtained to evaluate the vascular anatomy.
CONTRAST:  100mL [TC] IOPAMIDOL ([TC]) INJECTION 76%

[Series 5: thins · axial · 0.70mm/px · z∈[-241,+1]mm · 18 of 270 slices shown]
[im 14/270  lung]
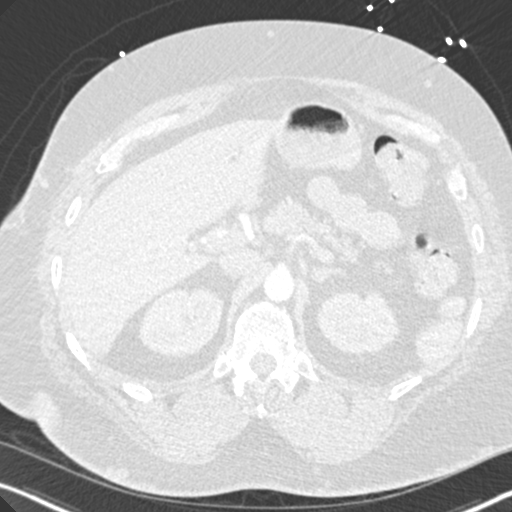
[im 27/270  mediastinal]
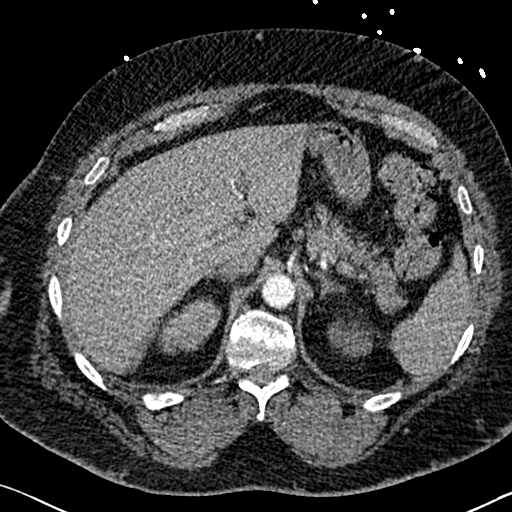
[im 41/270  lung]
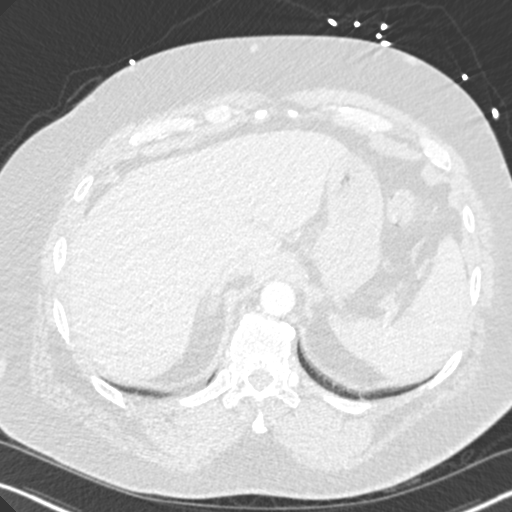
[im 54/270  mediastinal]
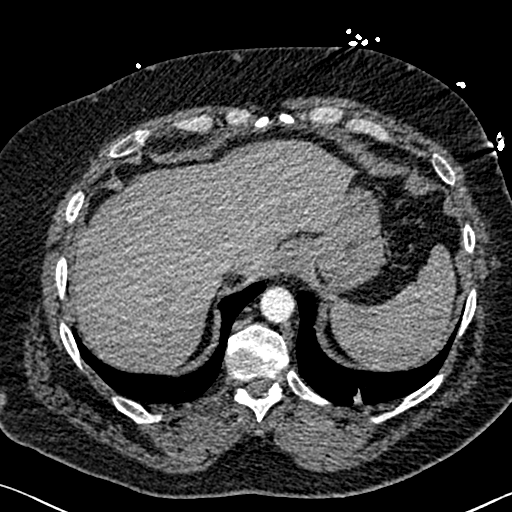
[im 68/270  lung]
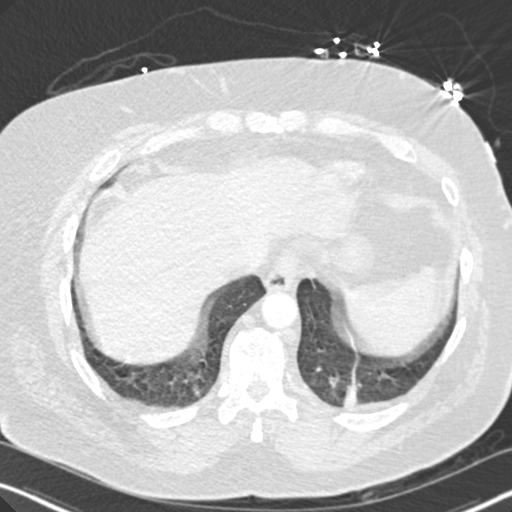
[im 81/270  mediastinal]
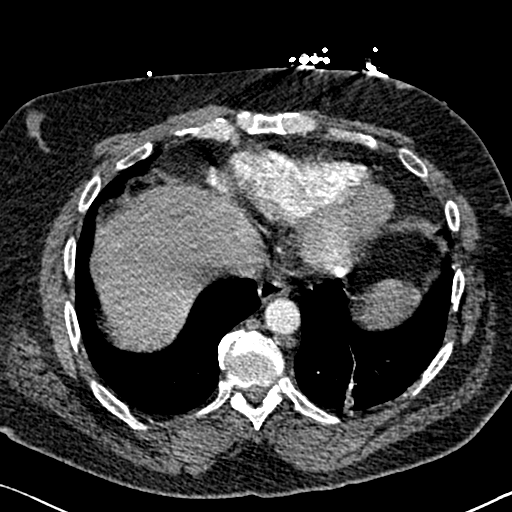
[im 95/270  lung]
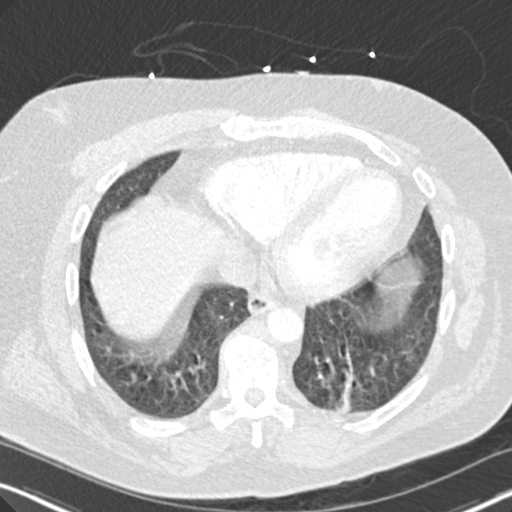
[im 108/270  mediastinal]
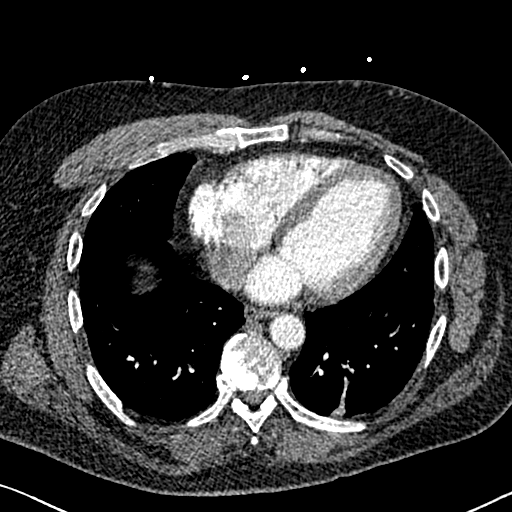
[im 122/270  lung]
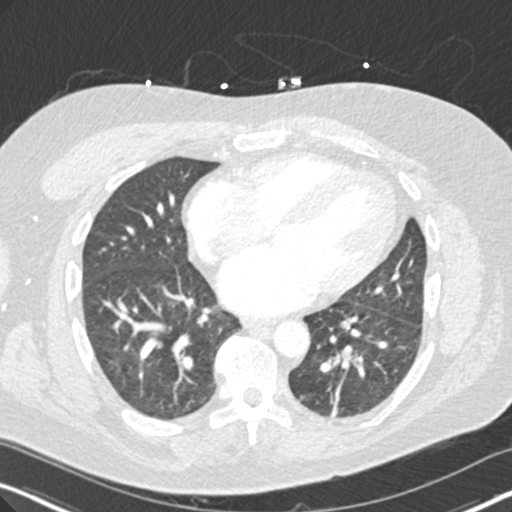
[im 148/270  mediastinal]
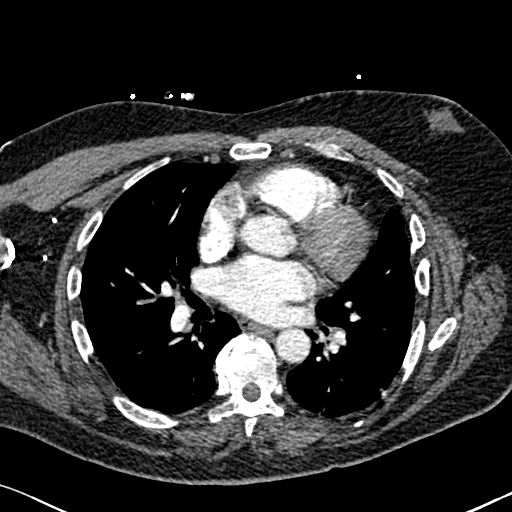
[im 162/270  lung]
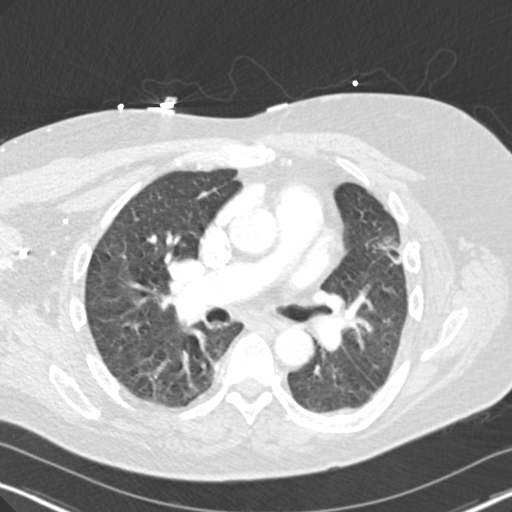
[im 175/270  mediastinal]
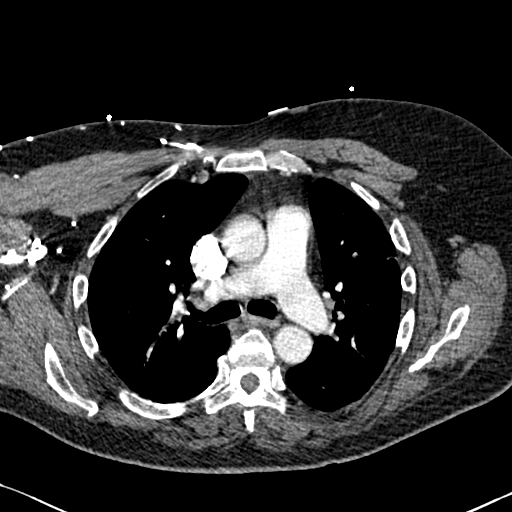
[im 189/270  lung]
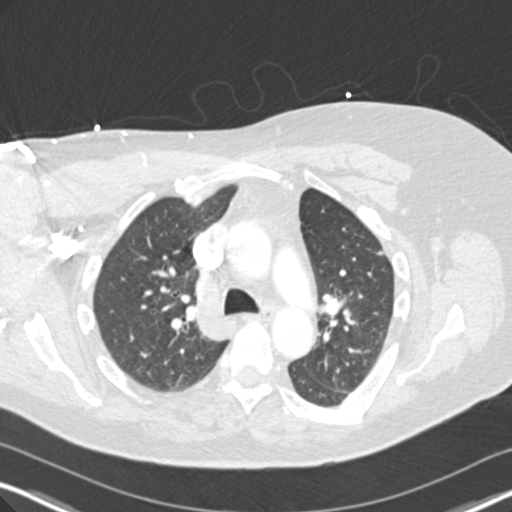
[im 202/270  mediastinal]
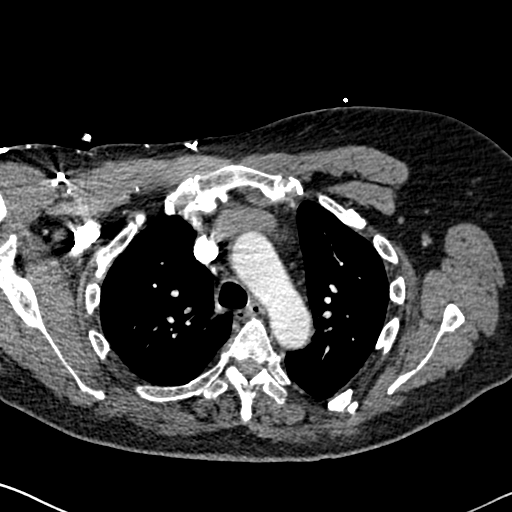
[im 216/270  lung]
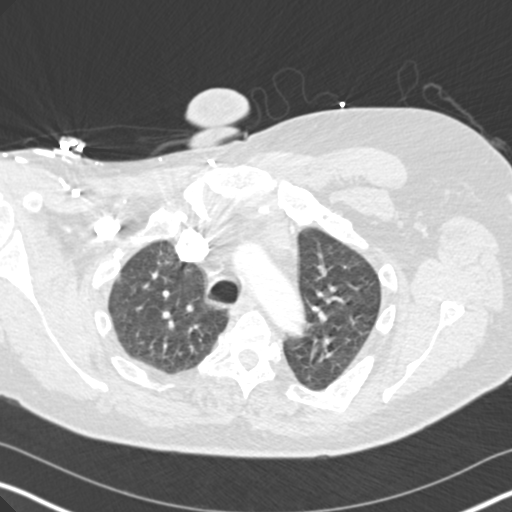
[im 229/270  mediastinal]
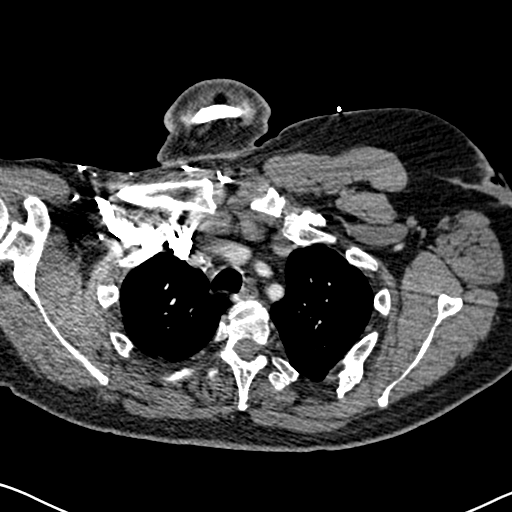
[im 243/270  lung]
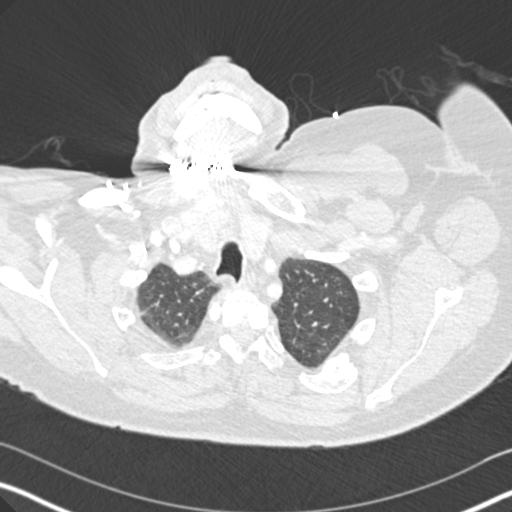
[im 256/270  mediastinal]
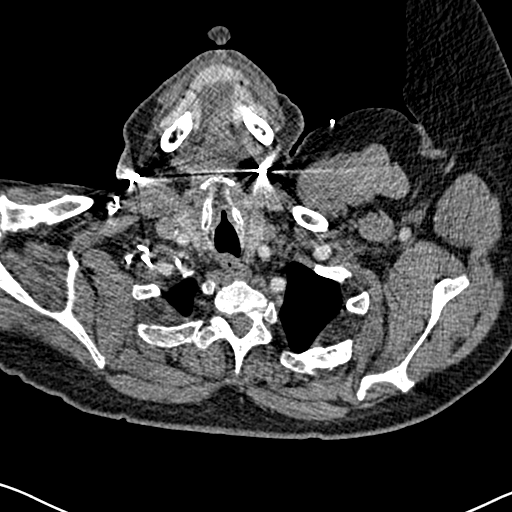

[Series 7: coronal mpr · coronal · 0.51mm/px · 1 of 151 slices shown]
[im 76/151  mediastinal]
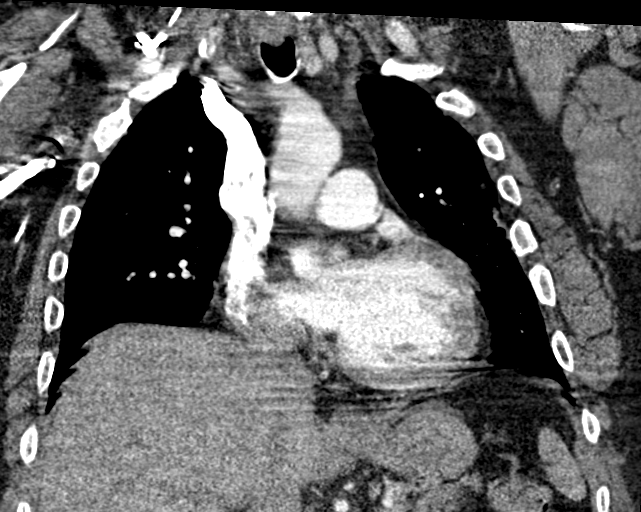

[19 of 36 positions shown; findings below may reference images not displayed]

FINDINGS: Cardiovascular: Satisfactory opacification of the pulmonary arteries
to the segmental level. No evidence of pulmonary embolism. Normal
heart size. No pericardial effusion.

Mediastinum/Nodes: No enlarged mediastinal, hilar, or axillary lymph
nodes. Thyroid gland, trachea, and esophagus demonstrate no
significant findings.

Lungs/Pleura: No pneumothorax or pleural effusion is noted. Right
lung is clear. Minimal left posterior basilar subsegmental
atelectasis is noted.

Upper Abdomen: Cholelithiasis is noted. No other abnormality seen in
visualized portion of upper abdomen.

Musculoskeletal: No chest wall abnormality. No acute or significant
osseous findings.

Review of the MIP images confirms the above findings.
IMPRESSION: No definite evidence of pulmonary embolus.

Cholelithiasis without inflammation.

## 2017-09-02 MED ORDER — ALBUTEROL SULFATE HFA 108 (90 BASE) MCG/ACT IN AERS
2.0000 | INHALATION_SPRAY | Freq: Once | RESPIRATORY_TRACT | Status: AC
  Administered 2017-09-02: 2 via RESPIRATORY_TRACT
  Filled 2017-09-02: qty 6.7

## 2017-09-02 MED ORDER — ALBUTEROL SULFATE (2.5 MG/3ML) 0.083% IN NEBU
5.0000 mg | INHALATION_SOLUTION | Freq: Once | RESPIRATORY_TRACT | Status: AC
  Administered 2017-09-02: 5 mg via RESPIRATORY_TRACT
  Filled 2017-09-02: qty 6

## 2017-09-02 MED ORDER — METHYLPREDNISOLONE SODIUM SUCC 125 MG IJ SOLR
125.0000 mg | Freq: Once | INTRAMUSCULAR | Status: AC
  Administered 2017-09-02: 125 mg via INTRAVENOUS
  Filled 2017-09-02: qty 2

## 2017-09-02 MED ORDER — IOPAMIDOL (ISOVUE-370) INJECTION 76%
100.0000 mL | Freq: Once | INTRAVENOUS | Status: AC | PRN
  Administered 2017-09-02: 100 mL via INTRAVENOUS

## 2017-09-02 MED ORDER — IOPAMIDOL (ISOVUE-370) INJECTION 76%
INTRAVENOUS | Status: AC
  Filled 2017-09-02: qty 100

## 2017-09-02 NOTE — Discharge Instructions (Addendum)
Your work-up today was overall reassuring.  We did not see evidence of pneumonia, blood clot, or cardiac cause of your symptoms.  We did not find evidence of fluid overload.  Please continue your home medication regimen and follow-up with your primary doctor in several days.  Please use the inhaler to help with your breathing.  If any symptoms change or worsen, please return to the nearest emergency department.

## 2017-09-02 NOTE — ED Notes (Signed)
Pt ambulated with this nurse down hallway and back without becoming SOB, listened to his lung sounds after returning and were clear bilaterally

## 2017-09-02 NOTE — ED Triage Notes (Addendum)
Pt complaint of SOB, wheezing, chest pain worsening over past month. Denies chest pain at present, only with wheezing/activity.

## 2017-09-02 NOTE — ED Notes (Signed)
Pt is alert and oriented x 4 and is verbally resposive. Pt SO is at bedside. Pt denies pain at this time. Pt is breathing normal and unlabored. Pt reports that he has had some intermittent wheezing that worsens with exertion.

## 2017-09-02 NOTE — ED Provider Notes (Signed)
[facility] DEPT Provider Note   CSN: [id number] Arrival date & time: 09/02/17  1148     History   Chief Complaint Chief Complaint  Patient presents with  . Shortness of Breath    HPI Allen Espinoza is a 57 y.o. male.  The history is provided by the patient and medical records. No language interpreter was used.  Chest Pain   This is a new problem. The current episode started more than 1 week ago. The problem occurs constantly. The problem has been gradually worsening. The pain is present in the substernal region. The pain is at a severity of 8/10. The pain is moderate. The quality of the pain is described as pressure-like and heavy. The pain does not radiate. Associated symptoms include cough, lower extremity edema, malaise/fatigue and shortness of breath. Pertinent negatives include no back pain, no diaphoresis, no dizziness, no exertional chest pressure, no fever, no headaches, no hemoptysis, no leg pain, no nausea, no near-syncope, no palpitations, no sputum production and no vomiting. He has tried nothing for the symptoms. The treatment provided no relief.  His past medical history is significant for DVT (with i9vc filter).    Past Medical History:  Diagnosis Date  . Abscess    Between legs  . Arthritis    all over  . Atrial fibrillation ([place])   . Avascular necrosis of femoral head, left ([place])   . Cholelithiasis   . CKD (chronic kidney disease) stage 3, GFR 30-59 ml/min (HCC)    sees kidney Dr.  . Diverticulosis   . DVT (deep venous thrombosis) (HCC)    legs  . Dyspnea    when walking  . Dysrhythmia    remembers mother taking about having an irregular rhythm years when he was a child   . GERD (gastroesophageal reflux disease)   . HIV (human immunodeficiency virus infection) ([place])   . Morbid obesity [facility])     Patient Active Problem List   Diagnosis Date Noted  . Status post total replacement of left hip 11/29/2016  . Unilateral primary  osteoarthritis, left hip 09/16/2016  . Edema 08/20/2016  . 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 Morbid (severe) obesity due to excess calories ([place]) 08/19/2016  . Solitary pulmonary nodule 05/06/2016  . Hidradenitis suppurativa 04/27/2016  . Chest pain 04/26/2016  . Avascular necrosis of bone of hip, left ([place]) 03/19/2016  . Human immunodeficiency virus (HIV) disease ([place]) 03/18/2016  . Chronic kidney disease 03/18/2016  . Encounter for immunization 03/18/2016  . Cholelithiasis 03/18/2016  . Diverticulosis of colon without hemorrhage 03/18/2016    Past Surgical History:  Procedure Laterality Date  . DENTAL SURGERY     had teeth pulled  . HYDRADENITIS EXCISION Left 10/14/2016   Procedure: WIDE EXCISION HIDRADENITIS LEFT AXILLA;  Surgeon: Coralie Keens, MD;  Location: [facility];  Service: General;  Laterality: Left;  . JOINT REPLACEMENT     Left hip Dr. Ninfa Linden 11/29/16  . TOTAL HIP ARTHROPLASTY Left 11/29/2016   Procedure: LEFT TOTAL HIP ARTHROPLASTY ANTERIOR APPROACH;  Surgeon: [name], MD;  Location: [facility];  Service: Orthopedics;  Laterality: Left;        Home Medications    Prior to Admission medications   Medication Sig Start Date End Date Taking? Authorizing Provider  Adalimumab 80 MG/0.8ML PNKT HS starter pack 160 mg day 1 (2 injections), then 80 mg on day 15 (1 injection) then start maintenance  dosing every week.  For HS L73.2. 06/04/17   [provider]  bictegravir-emtricitabine-tenofovir AF (BIKTARVY) 50-200-25 MG TABS tablet Take 1 tablet daily by mouth. 02/12/17   Campbell Riches, MD  metroNIDAZOLE (FLAGYL) 250 MG tablet  07/13/17   [provider]  omeprazole (PRILOSEC) 40 MG capsule Take 1 capsule (40 mg total) by mouth daily. Patient not taking: Reported on 08/06/2017 05/30/17 05/30/18  Welford Roche, MD  rivaroxaban (XARELTO) 20 MG TABS tablet Take 1 tablet (20 mg total)  by mouth daily with supper. 03/04/17   Welford Roche, MD    Family History Family History  Problem Relation Age of Onset  . Heart attack Mother   . Heart attack Father   . Prostate cancer Brother   . Bone cancer Maternal Aunt   . Breast cancer Maternal Aunt   . Colon cancer Neg Hx   . Esophageal cancer Neg Hx   . Stomach cancer Neg Hx   . Rectal cancer Neg Hx     Social History Social History   Tobacco Use  . Smoking status: Never Smoker  . Smokeless tobacco: Never Used  Substance Use Topics  . Alcohol use: No    Comment: prior  . Drug use: No    Comment: prior cocaine use, last 2005     Allergies   Sulfa antibiotics and Vancomycin   Review of Systems Review of Systems  Constitutional: Positive for chills, fatigue and malaise/fatigue. Negative for diaphoresis and fever.  HENT: Negative for congestion.   Eyes: Negative for visual disturbance.  Respiratory: Positive for cough, chest tightness, shortness of breath and wheezing. Negative for hemoptysis, sputum production and stridor.   Cardiovascular: Positive for chest pain and leg swelling. Negative for palpitations and near-syncope.  Gastrointestinal: Positive for diarrhea (resolved). Negative for constipation, nausea and vomiting.  Genitourinary: Positive for frequency. Negative for dysuria and flank pain.  Musculoskeletal: Negative for back pain, neck pain and neck stiffness.  Skin: Negative for rash and wound.  Neurological: Negative for dizziness, light-headedness and headaches.  Psychiatric/Behavioral: Negative for agitation and confusion.  All other systems reviewed and are negative.    Physical Exam Updated Vital Signs BP (!) 142/81 (BP Location: Right Arm)   Pulse (!) 57   Temp 98.3 F (36.8 C) (Oral)   Resp (!) 21   SpO2 99% ulse (!) 57   Temp 98.3 F (36.8 C) (Oral)   Resp (!) 21   SpO2 99%   Physical Exam  Constitutional: He is oriented to person, place, and time. He appears  well-developed and well-nourished.  Non-toxic appearance. He does not appear ill. No distress.  HENT:  Head: Normocephalic.  Mouth/Throat: Oropharynx is clear and moist. No oropharyngeal exudate.  Eyes: Pupils are equal, round, and reactive to light. Conjunctivae and EOM are normal.  Neck: Normal range of motion.  Cardiovascular: Normal rate and intact distal pulses.  Pulmonary/Chest: Effort normal. Tachypnea noted. No respiratory distress. He has wheezes. He has no rhonchi. He has rales. He exhibits no tenderness.  Abdominal: Soft. Bowel sounds are normal. He exhibits no distension. There is no tenderness.  Musculoskeletal: He exhibits edema. He exhibits no tenderness.       Right lower leg: He exhibits edema.       Left lower leg: He exhibits edema.  Lymphadenopathy:    He has no cervical adenopathy.  Neurological: He is alert and oriented to person, place, and time. No sensory deficit. He exhibits normal muscle tone.  Skin: Capillary  refill takes less than 2 seconds. He is not diaphoretic. No erythema. No pallor.  Psychiatric: He has a normal mood and affect.  Nursing note and vitals reviewed.    ED Treatments / Results  Labs (all labs ordered are listed, but only abnormal results are displayed) Labs Reviewed  URINE CULTURE  CBC WITH DIFFERENTIAL/PLATELET  COMPREHENSIVE METABOLIC PANEL  LIPASE, BLOOD  BRAIN NATRIURETIC PEPTIDE  URINALYSIS, ROUTINE W REFLEX MICROSCOPIC  I-STAT CG4 LACTIC ACID, ED  I-STAT TROPONIN, ED    EKG EKG Interpretation  Date/Time:  Tuesday September 02 2017 12:04:29 EDT Ventricular Rate:  60 PR Interval:    QRS Duration: 95 QT Interval:  417 QTC Calculation: 417 R Axis:   40 Text Interpretation:  Sinus rhythm When comapred to prior, no significant changes seen.  No STEMI Confirmed by Antony Blackbird [phone]) on 09/02/2017 12:09:11 PM   Radiology Dg Chest 2 View  Result Date: 09/02/2017 CLINICAL DATA:  Shortness of breath, cough. EXAM: CHEST - 2 VIEW  COMPARISON:  Radiographs of April 12, 2017. FINDINGS: The heart size and mediastinal contours are within normal limits. Both lungs are clear. No pneumothorax or pleural effusion is noted. The visualized skeletal structures are unremarkable. IMPRESSION: No active cardiopulmonary disease. Electronically Signed   By: Marijo Conception, M.D.   On: 09/02/2017 12:31   CT Angio Chest PE W and/or Wo Contrast  Final Result    DG Chest 2 View  Final Result        Procedures Procedures (including critical care time)  Medications Ordered in ED Medications  methylPREDNISolone sodium succinate (SOLU-MEDROL) 125 mg/2 mL injection 125 mg (has no administration in time range)  albuterol (PROVENTIL) (2.5 MG/3ML) 0.083% nebulizer solution 5 mg (5 mg Nebulization Given 09/02/17 1211)     Initial Impression / Assessment and Plan / ED Course  I have reviewed the triage vital signs and the nursing notes.  Pertinent labs & imaging results that were available during my care of the patient were reviewed by me and considered in my medical decision making (see chart for details).     Allen Espinoza is a 57 y.o. male with a past medical history significant for HIV, CKD, chronic DVT with IVC filter in place, atrial fibrillation with Xarelto as well as pulmonary nodule who presents with chest pain or shortness of breath and wheezing.  Patient reports that he has been having shortness breath gradually worsening for the last month.  He reports he has had some leg edema that comes and goes.  He says that he is been using compression stockings and that has improved it over the last few days.  He reports that his wheezing has continued and he has not used any inhalers.  He says that his chest pain is been in the left chest and feels pressure-like.  He reports that it is intermittent.  He reports it is a pressure that does not radiate.  There is no nausea vomiting or diaphoresis with it.  He reports that he has exertional  shortness of breath but he says the pain does not change with ambulation.  He reports he gets up to a 7 out of 10 but is currently chest pain-free.  He denies any recent trauma.  He does report that he has had some urinary frequency but denies dysuria.  His urine does look darker.  He denies any constipation but does report recent diarrhea that has resolved.  He reports a cough that is nonproductive and  no hemoptysis.  He denies other complaints other than fatigue.  On exam, lungs have wheezing and crackles.  Patient's abdomen and chest are nontender.  Patient does have several nodules that are palpated which he reports she has had for years that  he was told were fatty tissue.  He says that he has had chronic leg swelling but waxes and wanes.  He does have pitting edema on his legs.  Patient had normal sensation and strength in extremities and had 2+ pulses in his upper and lower extremities.  EKG showed no STEMI.  Patient did appear to be in sinus rhythm.  He denies any missing doses of Xarelto.   Patient was started on a DuoNeb treatment as well as site Medrol for his wheezing.  Suspect reactive airway disease possibly in the setting of fluid overload or exacerbation.  Patient will have a BNP as well as work-up for his heart.    Pulmonary embolism was considered however I am concerned patient may not have kidney function able to tolerate a PE study.  With a known DVT, doubt d-dimer would be helpful.  Anticipate reassessment after work-up.          Patient shortness of breath improved after breathing treatment.  Patient's troponin was negative.  BNP not elevated, doubt CHF exacerbation.   chest x-ray showed no pneumonia or fluid overload.  Metabolic panel showed improved kidney function and normal liver function.  Still cannot rule out PE, will obtain PE study given his improved kidney function.    Anticipate reassessment after imaging.  PE study showed no evidence of pulmonary embolism.  Patient  was feeling much better after the breathing treatment.  Heart score was a 3.  Given lack of chest pain, the report of his pain being going on for several days, and negative troponin, patient was felt stable for discharge home.   Patient given second dose of albuterol and was given an inhaler.  Patient was able to ambulate without shortness of breath or difficulty.  Patient will follow-up with PCP and understood return precautions.  Patient had no other questions or concerns and was discharged in good condition with improved symptoms.   Final Clinical Impressions(s) / ED Diagnoses    Clinical Impression: 1. Shortness of breath   2. Wheezing   3. Chest pain, unspecified type     Disposition: Discharge  Condition: Good  I have discussed the results, Dx and Tx plan with the pt(& family if present). He/she/they expressed understanding and agree(s) with the plan. Discharge instructions discussed at great length. Strict return precautions discussed and pt &/or family have verbalized understanding of the instructions. No further questions at time of discharge.    New Prescriptions   No medications on file    Follow Up: [facility] [address] [place] [id number] [phone] Schedule an appointment as soon as possible for a visit    [place] DEPT [place] [phone] [place]         Mariya Mottley, Gwenyth Allegra, MD 09/02/17 1840

## 2017-09-03 LAB — URINE CULTURE: Culture: NO GROWTH

## 2017-09-04 MED FILL — HUMIRA PEN *NO CITRATE*/40MG/0.4ML/PNKT: HUMIRA PEN *NO CITRATE*/40MG/0.4ML/PNKT | 28 days supply | Qty: 4 | Fill #3

## 2017-09-24 ENCOUNTER — Ambulatory Visit: Payer: Medicaid Other | Admitting: Vascular Surgery

## 2017-09-24 ENCOUNTER — Ambulatory Visit (HOSPITAL_COMMUNITY)
Admission: RE | Admit: 2017-09-24 | Discharge: 2017-09-24 | Disposition: A | Discharge status: Home / Self Care | Payer: Medicaid Other | Source: Ambulatory Visit | Attending: Vascular Surgery | Admitting: Vascular Surgery

## 2017-09-24 DIAGNOSIS — I872 Venous insufficiency (chronic) (peripheral): Secondary | ICD-10-CM | POA: Diagnosis not present

## 2017-09-24 DIAGNOSIS — Z86718 Personal history of other venous thrombosis and embolism: Secondary | ICD-10-CM | POA: Diagnosis not present

## 2017-09-25 NOTE — Unmapped (Signed)
Pt has 2 pens on hand will open new box on 7/16    [facility] Refill Coordination Note    Specialty Medication(s) to be Shipped:   Inflammatory Disorders: Humira    Other medication(s) to be shipped: n/a     Dustin Prince, DOB: [birth year]  Phone: [phone] (home)   Shipping Address: [address]  [address]  [place]  [address]    All above HIPAA information was verified with patient.     Completed refill call assessment today to schedule patient's medication shipment from the [facility] [phone]).       Specialty medication(s) and dose(s) confirmed: Regimen is correct and unchanged.   Changes to medications: Dustin Prince reports no changes reported at this time.  Changes to insurance: No  Questions for the pharmacist: No    The patient will receive an FSI print out for each medication shipped and additional FDA Medication Guides as required.  Patient education from [place] or [name] may also be included in the shipment.    DISEASE-SPECIFIC INFORMATION        For Inflammatory disorders patients on injectable medications: Patient currently has 2 doses left.  Next injection is scheduled for tuesday (every tuesday).    ADHERENCE     Medication Adherence    Patient reported X missed doses in the last month:  0  Specialty Medication:  HUMIRA  Patient is on additional specialty medications:  No  Patient is on more than two specialty medications:  No  Any gaps in refill history greater than 2 weeks in the last 3 months:  no  Demonstrates understanding of importance of adherence:  yes  Informant:  patient  Reliability of informant:  reliable  Support network for adherence:  family member  Confirmed plan for next specialty medication refill:  delivery by pharmacy  Refills needed for supportive medications:  not needed          Refill Coordination    Has the Patients' Contact Information Changed:  No  Is the Shipping Address Different:  No         SHIPPING     Shipping address confirmed in FSI.     Delivery Scheduled: Yes, Expected medication delivery date: 09/30/17 via UPS or courier.     Dustin Prince   [facility] Specialty Technician

## 2017-09-29 ENCOUNTER — Ambulatory Visit (INDEPENDENT_AMBULATORY_CARE_PROVIDER_SITE_OTHER): Payer: Medicaid Other | Admitting: Vascular Surgery

## 2017-09-29 ENCOUNTER — Encounter: Payer: Self-pay | Admitting: Vascular Surgery

## 2017-09-29 ENCOUNTER — Other Ambulatory Visit: Payer: Self-pay | Admitting: Infectious Diseases

## 2017-09-29 ENCOUNTER — Other Ambulatory Visit: Payer: Self-pay

## 2017-09-29 VITALS — BP 139/80 | HR 52 | Temp 97.3°F | Resp 16 | Ht 72.0 in | Wt 313.0 lb

## 2017-09-29 DIAGNOSIS — I82409 Acute embolism and thrombosis of unspecified deep veins of unspecified lower extremity: Secondary | ICD-10-CM

## 2017-09-29 DIAGNOSIS — I872 Venous insufficiency (chronic) (peripheral): Secondary | ICD-10-CM | POA: Diagnosis not present

## 2017-09-29 DIAGNOSIS — I825Z2 Chronic embolism and thrombosis of unspecified deep veins of left distal lower extremity: Secondary | ICD-10-CM

## 2017-09-29 MED FILL — HUMIRA PEN *NO CITRATE*/40MG/0.4ML/PNKT: HUMIRA PEN *NO CITRATE*/40MG/0.4ML/PNKT | 28 days supply | Qty: 4 | Fill #4
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
## 2017-09-30 ENCOUNTER — Other Ambulatory Visit: Payer: Self-pay | Admitting: Internal Medicine

## 2017-09-30 ENCOUNTER — Other Ambulatory Visit: Payer: Self-pay | Admitting: Infectious Diseases

## 2017-09-30 DIAGNOSIS — I825Z2 Chronic embolism and thrombosis of unspecified deep veins of left distal lower extremity: Secondary | ICD-10-CM

## 2017-09-30 DIAGNOSIS — I82409 Acute embolism and thrombosis of unspecified deep veins of unspecified lower extremity: Secondary | ICD-10-CM

## 2017-09-30 NOTE — Telephone Encounter (Signed)
Xarelto currently pending and awaiting approval from ID doc. Hubbard Hartshorn, RN, BSN

## 2017-09-30 NOTE — Telephone Encounter (Signed)
Pharmacy called pt needs a refill on xarelto, [facility] [address]

## 2017-10-01 MED ORDER — RIVAROXABAN 20 MG PO TABS: 20.0000 mg | ORAL_TABLET | Freq: Every day | ORAL | 3 refills | Status: DC

## 2017-10-01 NOTE — Telephone Encounter (Signed)
Dre with [facility] pharmacy requesting a refill on rivaroxaban (XARELTO) 20 MG TABS tablet. States pt is completely out, requesting the med to be filled by today.

## 2017-10-08 MED ORDER — METRONIDAZOLE 250 MG TABLET
ORAL_TABLET | 0 refills | 0 days | Status: CP
Start: 2017-10-08 — End: 2017-10-20

## 2017-10-17 DIAGNOSIS — Z7689 Persons encountering health services in other specified circumstances: Secondary | ICD-10-CM | POA: Diagnosis not present

## 2017-10-17 DIAGNOSIS — H40013 Open angle with borderline findings, low risk, bilateral: Secondary | ICD-10-CM | POA: Diagnosis not present

## 2017-10-20 ENCOUNTER — Encounter: Admit: 2017-10-20 | Discharge: 2017-10-21 | Payer: BLUE CROSS/BLUE SHIELD

## 2017-10-20 DIAGNOSIS — Z7689 Persons encountering health services in other specified circumstances: Secondary | ICD-10-CM | POA: Diagnosis not present

## 2017-10-20 DIAGNOSIS — L732 Hidradenitis suppurativa: Secondary | ICD-10-CM | POA: Diagnosis not present

## 2017-10-20 MED ORDER — METRONIDAZOLE 250 MG TABLET
ORAL_TABLET | Freq: Three times a day (TID) | ORAL | 1 refills | 0.00000 days | Status: CP
Start: 2017-10-20 — End: 2018-05-05

## 2017-10-20 NOTE — Unmapped (Signed)
ASSESSMENT/PLAN:  Hidradenitis Suppurativa;     Severe Hurley 3 HS scrotal, inguinal and perirenal; improving on Humira    We discussed the typical natural history, pathogenesis, treatment options, and expected course as well as the relapsing and sometimes recalcitrant nature of the disease.      -Continues to see marked improvement with Humira which she is tolerating well, however he has recalcitrant draining sinuses on the right scrotum and right inguinal.  We discussed that ultimately surgical intervention may get in the most improvement for these areas.  He is reluctant about surgery and would like to wait a few more months to see what continued improvement we can get with medical management.  Consider staged unroofing in clinic vs referral to plastics.      -will continue with metronidazole mono therapy for abx for 2 more months, as he is tolerating this well and sees continued improvement.      Screening;A1C wnl     Humira continue 40mg  [place] qweek  for treatment of hidradenitis.  Discussed risks of tuberculosis, other uncommon infections, theoretical risk of malignancies, and other uncommon side effects.    Hx well controlled HIV; ID physician Dr. Lilli Light aware and agrees with Humira    RTC: 2-3 m   Discussed with Dr. Janyth Contes who agrees with assessment and plan    SUBJECTIVE:    CC: Hidradenitis Suppurativa    Dustin Prince is a 57 y.o. male  who is seen for consultation today at the request of Brunilda Payor, MD for evaluation of hidradenitis suppurativa.    Self-reported severity (0-5): 2  VAS pain today: 1  VAS average pain for the last month: 1  Requiring pain medication? No.  If so, what type/frequency? none  How often in pain?  weekly  Level of odor (0-5): 1  Level of itching (0-5): 5  Dressing changes needed for drainage: weekly  How much drainage: some  Flare in the last month (Y/N)? no  How long ago was the last flare? in last 6 months  Developing new lesions? None recently  Number of inflammatory lesions montly: 1  DLQI: 6  Current treatment: Hurmira and metronidazole     How helpful is the current treatment in managing the following aspects of your disease?  Not at all helpful Somewhat helpful Very helpful   Pain   x   Decreasing length of flares   x   Decreasing new lesions   x   Drainage   x   Decreasing frequency of flares   x   Decreasing severity of flares   x   Odor   x     Disease course:  Year when symptoms first noticed: 2008  Year of diagnosis: 2008  Who diagnosed you? Dermatologist  Location of first symptoms: groin, axillae, inner thighs and buttocks  Typical involved areas include: groin, axillae, inner thighs and buttocks  Typical number of inflammatory lesions each month at baseline (from first visit): 3-5      FH:      Patient Mother Father Son Daughter Brother Sister Maternal Grandmother Maternal Grandfather Paternal Grandmother Paternal Grandfather Maternal Aunt Maternal Uncle Paternal Aunt Paternal Uncle Other:   Derm Hidradenitis Suppurativa  x        x                            Pilonidal sinus  Acne                                      Dissecting cellulitis(Scalp)                                     Eczema                                     Allergies  x                  Rheum Joint pains   x                                   SAPHO                                     Pyoderma gangrenosum                                     Back pain  x                                  Auto-immune disease   x                                  Asthma                    Endo Polycystic ovarian syndrome                                     Thyroid disease                    Vitamin D deficiency                   Psych Anxiety                                     Depression                                     Dementia                                     Suicidal thoughts*                                   Cardio Hypertension  High cholesterol                                     Heart attack    x x                               Stroke                                     Hem-onc Cancer  ______________                                    Anemia  x                  GI IBD (UC/Crohn's)                                   ID HIV  x                   Syphilis                     Other                         History of well-controlled HIV antiretroviral on ART.  See Dr. Ninetta Lights in [place].      Social History:  Current or former smoker? never  Amount smoking: n/a  How many years: n/a  ED visits in the last 5 years? 6-10  Difficulty affording medications? most of the time  Marital Status: in relationship  Living with some one? Yes.    Prior treatments:  Topical: topical ABX  Systemic: clidnamycin; doxy (not very helpful),   Past surgical procedures: large WLE left axilla c/b dehisence   Past laser procedures: none          ROS: the balance of 10 systems is negative unless otherwise documented      OBJECTIVE:   Gen: Well-appearing patient, appropriate, interactive, in no acute distress  Skin: Examination of the scalp, face, neck, chest, back, abdomen, bilateral upper and lower extremities, hands, palms, soles, nails, buttocks, and external genitalia performed today and pertinent for:     -1 DS left inguinal  -2 NDS left scrotum   -old surgical scar left axilla well healed   -non inflamed nodules and scar bilateral inner thighs      AN count (total sum of abscess and inflammatory nodule): 1  Pilonidal sinus (Y/N, or previously treated)? No.

## 2017-10-20 NOTE — Unmapped (Signed)
Patient Education        Hidradenitis Suppurativa: Care Instructions  Your Care Instructions    Hidradenitis suppurativa (say hih-drad-uh-NY-tus sup-yur-uh-TY-vuh) is a skin condition that causes lumps on the skin that look like pimples or boils. The lumps are usually painful and can break open and drain blood and bad-smelling pus. The condition can come and go for many years.  Treatment for this condition may include antibiotics and other medicines. You may need surgery to remove the lumps. Home care includes wearing loose-fitting clothes and washing the area gently. You can help prevent lumps from coming back by staying at a healthy weight and not smoking.  Doctors don't know exactly how this condition starts. But they do know that something irritates and inflames the hair follicles, causing them to swell and form lumps. This skin condition can't be spread from person to person (isn't contagious).  Follow-up care is a key part of your treatment and safety. Be sure to make and go to all appointments, and call your doctor if you are having problems. It's also a good idea to know your test results and keep a list of the medicines you take.  How can you care for yourself at home?  ??Skin care  ?? ?? Wash the area every day with mild soap. Use your hands rather than a washcloth or sponge when you wash that part of your body.   ?? ?? Leave the affected areas uncovered when you can. If you have lumps that are draining, you can cover them with a bandage or other dressing. Put petroleum jelly (such as Vaseline) on the dressing to help keep it from sticking.   ?? ?? Wear-loose fitting clothes that don't rub against the area. Avoid activities that cause skin to rub together.   ?? ?? If you have pain, try a warm compress. Soak a towel or washcloth in warm water, wring it out, and place it on the affected skin for about 10 minutes.   Medicines  ?? ?? Be safe with medicines. Take your medicines exactly as prescribed. Call your doctor if you think you are having a problem with your medicine. You will get more details on the specific medicines your doctor prescribes.   ?? ?? If your doctor prescribed antibiotics, take them as directed. Do not stop taking them just because you feel better. You need to take the full course of antibiotics.   ??Lifestyle choices  ?? ?? If you smoke, think about quitting. Smoking can make the condition worse. If you need help quitting, talk to your doctor about stop-smoking programs and medicines. These can increase your chances of quitting for good.   ?? ?? Stay at a healthy weight, or lose weight, by eating healthy foods and being physically active. Being overweight could make this condition worse.   When should you call for help?  Call your doctor now or seek immediate medical care if:  ?? ?? You have symptoms of infection, such as:  ? Increased pain, swelling, warmth, or redness.  ? Red streaks leading from the area.  ? Pus draining from the area.  ? A fever.   ??Watch closely for changes in your health, and be sure to contact your doctor if:  ?? ?? You do not get better as expected.   Where can you learn more?  Go to [facility] at [email or web address]  Select Preferences in the upper right hand corner, then select Health Library under Resources. Enter [phone] in the search box  to learn more about Hidradenitis Suppurativa: Care Instructions.  Current as of: June 30, 2017  Content Version: 12.1  ?? 2006-2019 [employer]. Care instructions adapted under license by [facility]. If you have questions about a medical condition or this instruction, always ask your healthcare professional. [employer], Inc[employer] disclaims any warranty or liability for your use of this information.

## 2017-10-27 DIAGNOSIS — D631 Anemia in chronic kidney disease: Secondary | ICD-10-CM | POA: Diagnosis not present

## 2017-10-27 DIAGNOSIS — Z7689 Persons encountering health services in other specified circumstances: Secondary | ICD-10-CM | POA: Diagnosis not present

## 2017-10-27 DIAGNOSIS — N183 Chronic kidney disease, stage 3 (moderate): Secondary | ICD-10-CM | POA: Diagnosis not present

## 2017-10-30 NOTE — Unmapped (Signed)
[facility] Refill Coordination Note    Specialty Medication(s) to be Shipped:   Inflammatory Disorders: Humira    Other medication(s) to be shipped: n/a     Dustin Prince, DOB: 1960/05/12  Phone: [phone] (home)   Shipping Address: [address]  [address]  [place] [address]    All above HIPAA information was verified with patient.     Completed refill call assessment today to schedule patient's medication shipment from the [facility] [phone]).       Specialty medication(s) and dose(s) confirmed: Regimen is correct and unchanged.   Changes to medications: Dustin Prince reports no changes reported at this time.  Changes to insurance: No  Questions for the pharmacist: No    The patient will receive an FSI print out for each medication shipped and additional FDA Medication Guides as required.  Patient education from [name] or [name] may also be included in the shipment.    DISEASE/MEDICATION-SPECIFIC INFORMATION        For Inflammatory disorders patients on injectable medications: Patient currently has 1 doses left.  Next injection is scheduled for tuesday. new box 8/13    ADHERENCE     Medication Adherence    Patient reported X missed doses in the last month:  0  Specialty Medication:  HUMIRA  Patient is on additional specialty medications:  No  Patient is on more than two specialty medications:  No  Any gaps in refill history greater than 2 weeks in the last 3 months:  no  Demonstrates understanding of importance of adherence:  yes  Informant:  patient  Reliability of informant:  reliable  Support network for adherence:  family member  Confirmed plan for next specialty medication refill:  delivery by pharmacy  Refills needed for supportive medications:  not needed          Refill Coordination    Has the Patients' Contact Information Changed:  No  Is the Shipping Address Different:  No         SHIPPING     Shipping address confirmed in FSI.     Delivery Scheduled: Yes, Expected medication delivery date: 8/6 via UPS or courier.     Dustin Prince   [facility] Specialty Technician

## 2017-11-02 MED FILL — HUMIRA PEN *NO CITRATE*/40MG/0.4ML/PNKT: HUMIRA PEN *NO CITRATE*/40MG/0.4ML/PNKT | 28 days supply | Qty: 4 | Fill #5

## 2017-11-12 ENCOUNTER — Ambulatory Visit (INDEPENDENT_AMBULATORY_CARE_PROVIDER_SITE_OTHER): Payer: Medicaid Other

## 2017-11-12 ENCOUNTER — Encounter (INDEPENDENT_AMBULATORY_CARE_PROVIDER_SITE_OTHER): Payer: Self-pay | Admitting: Orthopaedic Surgery

## 2017-11-12 ENCOUNTER — Ambulatory Visit (INDEPENDENT_AMBULATORY_CARE_PROVIDER_SITE_OTHER): Payer: Medicaid Other | Admitting: Orthopaedic Surgery

## 2017-11-12 DIAGNOSIS — Z96642 Presence of left artificial hip joint: Secondary | ICD-10-CM

## 2017-11-12 NOTE — Progress Notes (Signed)
HPI: Mr. Dettman returns today 11 months status post left total hip arthroplasty.  He states the left hip overall is doing great.  He has good range of motion and strength of the hip.  He will do cross his legs he states if he does not think about it he really does not know that he had the hip replacement outside of the does not have the pain he was having preoperatively.  He has no pain in his right hip.  Review of systems: Please see HPI otherwise negative  Physical exam: Left hip good range of motion without pain.  Left calf supple nontender.  Ambulates without assistive device or antalgic gait.  Radiographs: AP pelvis lateral view of the left hip show no acute fractures.  Both hips are well located on the AP view.  Slight narrowing of the right hip joint.  Well-seated left total hip components.  No evidence of loosening or hardware failure left hip.  Impression: Status post left total hip arthroplasty  Plan: He will follow with [place] on as-needed basis if he has any concerns or questions.  Questions were encouraged and answered.

## 2017-11-19 ENCOUNTER — Encounter: Payer: Self-pay | Admitting: Internal Medicine

## 2017-11-19 ENCOUNTER — Ambulatory Visit: Payer: Medicaid Other | Admitting: Internal Medicine

## 2017-11-19 VITALS — BP 153/70 | HR 49 | Temp 98.9°F | Wt 320.6 lb

## 2017-11-19 DIAGNOSIS — I825Z2 Chronic embolism and thrombosis of unspecified deep veins of left distal lower extremity: Secondary | ICD-10-CM

## 2017-11-19 DIAGNOSIS — Z79899 Other long term (current) drug therapy: Secondary | ICD-10-CM

## 2017-11-19 DIAGNOSIS — Z7689 Persons encountering health services in other specified circumstances: Secondary | ICD-10-CM | POA: Diagnosis not present

## 2017-11-19 DIAGNOSIS — N183 Chronic kidney disease, stage 3 unspecified: Secondary | ICD-10-CM

## 2017-11-19 DIAGNOSIS — Z6841 Body Mass Index (BMI) 40.0 and over, adult: Secondary | ICD-10-CM | POA: Diagnosis not present

## 2017-11-19 DIAGNOSIS — R0789 Other chest pain: Secondary | ICD-10-CM

## 2017-11-19 DIAGNOSIS — Z95828 Presence of other vascular implants and grafts: Secondary | ICD-10-CM

## 2017-11-19 DIAGNOSIS — R079 Chest pain, unspecified: Secondary | ICD-10-CM

## 2017-11-19 DIAGNOSIS — L732 Hidradenitis suppurativa: Secondary | ICD-10-CM

## 2017-11-19 DIAGNOSIS — Z7901 Long term (current) use of anticoagulants: Secondary | ICD-10-CM

## 2017-11-19 DIAGNOSIS — Z0000 Encounter for general adult medical examination without abnormal findings: Secondary | ICD-10-CM

## 2017-11-19 DIAGNOSIS — Z23 Encounter for immunization: Secondary | ICD-10-CM | POA: Diagnosis not present

## 2017-11-19 DIAGNOSIS — B20 Human immunodeficiency virus [HIV] disease: Secondary | ICD-10-CM

## 2017-11-19 MED ORDER — OMEPRAZOLE 40 MG PO CPDR: 40.0000 mg | DELAYED_RELEASE_CAPSULE | Freq: Every day | ORAL | 2 refills | Status: DC

## 2017-11-19 MED ORDER — SIMETHICONE 80 MG PO CHEW: 80.0000 mg | CHEWABLE_TABLET | Freq: Four times a day (QID) | ORAL | 2 refills | Status: DC | PRN

## 2017-11-19 NOTE — Patient Instructions (Addendum)
Mr. Remer,   Continue taking all your medications as usual.  For your chest pain this is likely coming from your stomach.  Please start taking omeprazole 1 tablet once a day before a meal.  Also sent a prescription for simethicone to help with gas.  You can take this medication up to 4 times a day as needed when you feel stomach pain.  I will contact our nutritionist and let her know to schedule an appointment with you to talk about diet and exercise.  She will give you a call to set this appointment up.  You received a flu shot today.  Follow-up with me in 6 months for regular checkup or sooner if needed.  Please call us if you have any questions or concerns.  - Dr. Frederico Hamman

## 2017-11-25 ENCOUNTER — Encounter: Payer: Self-pay | Admitting: Internal Medicine

## 2017-11-25 NOTE — Assessment & Plan Note (Signed)
Morbid obesity: Patient complaints of weight gain this past year. Since 04/2017 he has gained 20 lbs. Reports not adhering to a healthy diet and not being very active. Does not exercise. Not on any medications that are associated with weight gain.  Biktarvy can increase LDL but last lipid panel in 04/2017 with normal LDL, triglycerides, and cholesterol.  He is interested in nutrition counseling.  Will refer to nutrition services. - Referral to nutrition services  - Counseled on healthy diet and exercise

## 2017-11-25 NOTE — Assessment & Plan Note (Signed)
Preventive care: Flu shot given today.  Colonoscopy performed in 07/2017: Normal, I recommend a repeat in 10 years.
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
## 2017-11-25 NOTE — Progress Notes (Signed)
Internal Medicine Clinic Attending  Case discussed with Dr. Santos-Sanchez at the time of the visit.  We reviewed the resident's history and exam and pertinent patient test results.  I agree with the assessment, diagnosis, and plan of care documented in the resident's note.    

## 2017-11-25 NOTE — Assessment & Plan Note (Signed)
CKD Stage III: Cr currently at baseline 08/2017. Follows up with Dr. Marval Regal every 6 months.

## 2017-11-25 NOTE — Assessment & Plan Note (Signed)
HIV, well-controlled with incomplete immune reconstitution: On Biktarvy and compliant. HIV RNA undetectable 04/2017 and CD4 190. Follows up with Dr. [name].  - Continue Biktarvy per ID, appreciate assistance
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
## 2017-11-25 NOTE — Assessment & Plan Note (Signed)
Atypical chest pain: Allen Espinoza presents with a 9-month history of substernal, nonradiating chest pain that is associated with food intake and worsens when on supine position.  He was prescribed omeprazole in 05/2017 but ran out of medication " a while ago" does not remember if this helped or not.  He reports his symptoms completely resolved after belching.  Denies associated symptoms such as shortness of breath, palpitations, nausea, vomiting, and jaw/neck pain.  Suspect symptoms are secondary to GERD given association with food intake and resolution with belching.  Does have risk factors for CVD, but does not report angina and symptoms not associated with exertion.  Low suspicion for ACS at this time.  We will do another trial of PPI and add simethicone.  - Omeprazole 40 mg QD 30 minutes prior to breakfast  - Simethicone 80 mg QID PRN  - Follow up in 3 months  - Consider EGD in the future if fails medical management

## 2017-11-25 NOTE — Assessment & Plan Note (Signed)
HS: Recently started on Adalimumab by his dermatologist. He is also on chronic metronidazole therapy. He is aware of increased risk of opportunistic infections while on adalimumab given incomplete immune reconstitution on ART.

## 2017-11-25 NOTE — Unmapped (Signed)
Patient doing well, saw derm recently. Has been having chest pains, but sound gastric (timing is post- prandial, especially when he overeats.). His PCP recommended gas-x. Assured him that Humira is not associated with these symptoms.    [facility] Refill and Clinical Coordination Note  Medication(s): Dustin Prince, DOB: 1960/09/10  Phone: [phone] (home) , Alternate phone contact: N/A  Shipping address: [address]  [place]  [address]  Phone or address changes today?: No  All above HIPAA information verified.  Insurance changes? No    Completed refill and clinical call assessment today to schedule patient's medication shipment from the [facility] [phone]).      MEDICATION RECONCILIATION    Confirmed the medication and dosage are correct and have not changed: Yes, regimen is correct and unchanged.    Were there any changes to your medication(s) in the past month:  No, there are no changes reported at this time.    ADHERENCE    Is this medicine transplant or covered by Medicare Part B? No.    Did you miss any doses in the past 4 weeks? No missed doses reported.  Adherence counseling provided? Not needed     SIDE EFFECT MANAGEMENT    Are you tolerating your medication?:  Dustin Prince reports tolerating the medication.  Side effect management discussed: None      Therapy is appropriate and should be continued.    Evidence of clinical benefit: See Epic note from 10/20/17      FINANCIAL/SHIPPING    Delivery Scheduled: Yes, Expected medication delivery date: Friday, Aug 30     Additional medications refilled: No additional medications/refills needed at this time.    The patient will receive a drug information handout for each medication shipped and additional FDA Medication Guides as required.      Dustin Prince did not have any additional questions at this time.    Delivery address confirmed in Epic.     We will follow up with patient monthly for standard refill processing and delivery.      Thank you,  Tawanna Solo [facility] Specialty Pharmacist

## 2017-11-27 MED FILL — HUMIRA PEN CITRATE FREE 40 MG/0.4 ML: 28 days supply | Qty: 4 | Fill #0 | Status: AC

## 2017-11-27 MED FILL — HUMIRA PEN CITRATE FREE 40 MG/0.4 ML: 28 days supply | Qty: 4 | Fill #0

## 2017-12-10 ENCOUNTER — Encounter: Payer: Self-pay | Admitting: Dietician

## 2017-12-10 ENCOUNTER — Ambulatory Visit: Payer: Medicaid Other | Admitting: Dietician

## 2017-12-10 NOTE — Patient Instructions (Addendum)
Eat less snacks and if you do eat them- try to have a  healthy snack  You can start by trying to eat HALF a sandwich - one piece of bread instead of two  It is important to be sure to buy these foods when you shop.   Healthy snacks-  Apple  Banana  Applesauce  Jello  Light mayonnaise WHOLE Wheat Bread Nuts- unsalted Popcorn- Fat free or light Broccoli Greens String beans Corn Peanut butter crackers ( makde your own on whole grain crackers)

## 2017-12-10 NOTE — Progress Notes (Signed)
Initial visit:   Documentation:  Appt start time: 1030 end time:  1130.  Assessment:  Primary concerns today: Weight management.  Mr. Snowdon reports more than 70 # weight gain in the past 3 years after moving to [facility] and hip surgery that both limited his activities. His chart shows 54# documented weight gain since 12/2015.  He reports that he is feels preoccupied with eating and that he thinks he may be bored.  He is slightly food insecure, utilizes food pantries and food stamps.   Usual eating pattern includes 2 meals and 3 snacks per day. Everyday foods include white bread, lunchmeat, mayo, cake, ice cream.  Vegetables- daily, fruits - a few times a month Eating pattern: Snack mid sleep, snack- 4 AM- B (10 AM)-  Snk (12-1 PM)-    D ( PM)- well balanced that he or/and fiance cook, sometimes out Estimated body mass index is 44.02 kg/m as calculated from the following:   Height as of this encounter: 6' (1.829 m).   Weight as of this encounter: 324 lb 9.6 oz (147.2 kg). Wt Readings from Last 5 Encounters:  12/10/17 (!) 324 lb 9.6 oz (147.2 kg)  11/19/17 (!) 320 lb 9.6 oz (145.4 kg)  09/29/17 (!) 313 lb (142 kg)  08/06/17 (!) 313 lb (142 kg)  06/20/17 (!) 312 lb (141.5 kg)   Usual physical activity includes walks a few times a week to bus and back home that takes 5-10 minutes each time, watches Television often. He has a gym that he can use for free at his apartment complex. He used to enjoy playing sports(basketball).    Progress Towards Goal(s):  In progress.    Intervention:  Nutrition education about weight loss and ways to achieve his goals with weight loss through lifestyle change of moving more and making better food choices.   Monitoring/Evaluation:  Dietary intake, exercise, and body weight in 2 week(s).need to assess sleep quality at follow up.  Debera Lat, RD 12/10/2017 11:52 AM.

## 2017-12-17 NOTE — Unmapped (Signed)
Patient is doing well on the humira at this time  He has 2 pens on hand  No missed doses reported/no med changes    [facility] Refill Coordination Note    Specialty Medication(s) to be Shipped:   Inflammatory Disorders: Humira    Other medication(s) to be shipped: n/a     Dustin Prince, DOB: 1960/12/26  Phone: [phone] (home)   Shipping Address: [address]  [place] [address]    All above HIPAA information was verified with patient.     Completed refill call assessment today to schedule patient's medication shipment from the [facility] [phone]).       Specialty medication(s) and dose(s) confirmed: Regimen is correct and unchanged.   Changes to medications: Dustin Prince reports no changes reported at this time.  Changes to insurance: No  Questions for the pharmacist: No    The patient will receive a drug information handout for each medication shipped and additional FDA Medication Guides as required.      DISEASE/MEDICATION-SPECIFIC INFORMATION        he currently has 2 pens on hand to use    ADHERENCE     Medication Adherence    Patient reported X missed doses in the last month:  0  Specialty Medication:  humira  Patient is on additional specialty medications:  No  Patient is on more than two specialty medications:  No  Any gaps in refill history greater than 2 weeks in the last 3 months:  no  Demonstrates understanding of importance of adherence:  yes  Informant:  patient  Reliability of informant:  reliable  Support network for adherence:  family member  Confirmed plan for next specialty medication refill:  delivery by pharmacy  Refills needed for supportive medications:  not needed          Refill Coordination    Has the Patients' Contact Information Changed:  No  Is the Shipping Address Different:  No         SHIPPING     Shipping address confirmed in Epic.     Delivery Scheduled: Yes, Expected medication delivery date: 9/26 via UPS or courier.     Dustin Prince   [facility] Specialty Technician

## 2017-12-24 ENCOUNTER — Ambulatory Visit: Payer: Medicaid Other | Admitting: Dietician

## 2017-12-24 MED FILL — HUMIRA PEN CITRATE FREE 40 MG/0.4 ML: 28 days supply | Qty: 4 | Fill #1 | Status: AC

## 2017-12-24 MED FILL — HUMIRA PEN CITRATE FREE 40 MG/0.4 ML: 28 days supply | Qty: 4 | Fill #1

## 2017-12-24 NOTE — Progress Notes (Addendum)
Documentation:  Appt start time: 1030 end time:  1100.  Assessment:  Primary concerns today: Weight management.  Allen Espinoza reports buying and eating healthier snacks foods like bananas, apples and applesauce. He reports having a difficult time stopping eating such as when watching TV.  He eats large amounts of meat. They are drinking koolaid, juice and gatorade in addition to water. His partner is with him today who has diabetes and is interested in eating healthier.    Usual eating pattern includes 2 meals and 3 snacks per day. Everyday foods include white bread, lunchmeat, mayo, cake, ice cream.  Vegetables- daily, fruits - a few times a month Eating pattern: Snack mid sleep ~ 11 PM, snack- 4 AM- B (10 AM)-  Snk (12-1 PM)-    D ( PM)- well balanced that he or/and fiance cook, sometimes out ~ 1x/month  Estimated body mass index is 43.85 kg/m as calculated from the following:   Height as of 12/10/17: 6' (1.829 m).   Weight as of this encounter: 323 lb 4.8 oz (146.6 kg). Wt Readings from Last 5 Encounters:  12/24/17 (!) 323 lb 4.8 oz (146.6 kg)  12/10/17 (!) 324 lb 9.6 oz (147.2 kg)  11/19/17 (!) 320 lb 9.6 oz (145.4 kg)  09/29/17 (!) 313 lb (142 kg)  08/06/17 (!) 313 lb (142 kg)   Usual physical activity includes walks a few times a week to bus and back home that takes 5-10 minutes each time.    Progress Towards Goal(s):  Some progress.    Intervention:  Nutrition education about lifestyle changes to assist him with being healthy and encouraging weight loss.  Coordination of care- weight loss and healthier lifestyle can also help kidney disease   Monitoring/Evaluation:  Dietary intake, exercise, and body weight in 4 week(s).need to assess sleep quality at follow up.  Debera Lat, RD 12/24/2017 3:28 PM.

## 2017-12-24 NOTE — Patient Instructions (Addendum)
Goal for next few weeks is to  exercise  3x/week (walking or bike in gym or other) .   We can talk about the candy next time.    Good job with buying and preparing and eating healthier snacks!  Butch Penny [phone]

## 2018-01-05 ENCOUNTER — Other Ambulatory Visit: Payer: Self-pay | Admitting: Internal Medicine

## 2018-01-05 DIAGNOSIS — N183 Chronic kidney disease, stage 3 (moderate): Secondary | ICD-10-CM | POA: Diagnosis not present

## 2018-01-05 DIAGNOSIS — I825Z2 Chronic embolism and thrombosis of unspecified deep veins of left distal lower extremity: Secondary | ICD-10-CM

## 2018-01-05 DIAGNOSIS — D631 Anemia in chronic kidney disease: Secondary | ICD-10-CM | POA: Diagnosis not present

## 2018-01-05 DIAGNOSIS — I82409 Acute embolism and thrombosis of unspecified deep veins of unspecified lower extremity: Secondary | ICD-10-CM

## 2018-01-14 NOTE — Unmapped (Signed)
Patient is doing well at this time  He has 2 shots on hand- no missed doses reported    [facility] Refill Coordination Note    Specialty Medication(s) to be Shipped:   Inflammatory Disorders: Humira    Other medication(s) to be shipped: n/a     Dustin Prince, DOB: 10-Sep-1960  Phone: [phone] (home)   Shipping Address: [address]  [place] [address]    All above HIPAA information was verified with patient.     Completed refill call assessment today to schedule patient's medication shipment from the [facility] [phone]).       Specialty medication(s) and dose(s) confirmed: Regimen is correct and unchanged.   Changes to medications: Dustin Prince reports no changes reported at this time.  Changes to insurance: No  Questions for the pharmacist: No    The patient will receive a drug information handout for each medication shipped and additional FDA Medication Guides as required.      DISEASE/MEDICATION-SPECIFIC INFORMATION        For Inflammatory disorders patients on injectable medications: Patient currently has 2 doses left.  Next injection is scheduled for next week.    ADHERENCE     Medication Adherence    Patient reported X missed doses in the last month:  0  Specialty Medication:  humira  Patient is on additional specialty medications:  No  Patient is on more than two specialty medications:  No  Any gaps in refill history greater than 2 weeks in the last 3 months:  no  Demonstrates understanding of importance of adherence:  yes  Informant:  patient  Reliability of informant:  reliable          Support network for adherence:  family member      Confirmed plan for next specialty medication refill:  delivery by pharmacy  Refills needed for supportive medications:  not needed          Refill Coordination    Has the Patients' Contact Information Changed:  No  Is the Shipping Address Different:  No         SHIPPING     Shipping address confirmed in Epic.     Delivery Scheduled: Yes, Expected medication delivery date: 10/22 via UPS or courier.        Dustin Prince   [facility] Specialty Technician

## 2018-01-15 DIAGNOSIS — R0789 Other chest pain: Secondary | ICD-10-CM | POA: Diagnosis not present

## 2018-01-15 DIAGNOSIS — L732 Hidradenitis suppurativa: Secondary | ICD-10-CM | POA: Diagnosis not present

## 2018-01-15 DIAGNOSIS — M87052 Idiopathic aseptic necrosis of left femur: Secondary | ICD-10-CM | POA: Diagnosis not present

## 2018-01-15 DIAGNOSIS — Z7689 Persons encountering health services in other specified circumstances: Secondary | ICD-10-CM | POA: Diagnosis not present

## 2018-01-15 DIAGNOSIS — I82409 Acute embolism and thrombosis of unspecified deep veins of unspecified lower extremity: Secondary | ICD-10-CM | POA: Diagnosis not present

## 2018-01-15 DIAGNOSIS — Z23 Encounter for immunization: Secondary | ICD-10-CM | POA: Diagnosis not present

## 2018-01-15 DIAGNOSIS — B20 Human immunodeficiency virus [HIV] disease: Secondary | ICD-10-CM | POA: Diagnosis not present

## 2018-01-15 DIAGNOSIS — R748 Abnormal levels of other serum enzymes: Secondary | ICD-10-CM | POA: Diagnosis not present

## 2018-01-15 DIAGNOSIS — N183 Chronic kidney disease, stage 3 (moderate): Secondary | ICD-10-CM | POA: Diagnosis not present

## 2018-01-15 DIAGNOSIS — I129 Hypertensive chronic kidney disease with stage 1 through stage 4 chronic kidney disease, or unspecified chronic kidney disease: Secondary | ICD-10-CM | POA: Diagnosis not present

## 2018-01-15 DIAGNOSIS — I519 Heart disease, unspecified: Secondary | ICD-10-CM | POA: Diagnosis not present

## 2018-01-19 MED FILL — HUMIRA PEN CITRATE FREE 40 MG/0.4 ML: 28 days supply | Qty: 4 | Fill #2 | Status: AC

## 2018-01-19 MED FILL — HUMIRA PEN CITRATE FREE 40 MG/0.4 ML: 28 days supply | Qty: 4 | Fill #2

## 2018-01-21 ENCOUNTER — Ambulatory Visit: Payer: Medicaid Other | Admitting: Dietician

## 2018-01-28 ENCOUNTER — Encounter: Payer: Self-pay | Admitting: Cardiovascular Disease

## 2018-02-04 ENCOUNTER — Ambulatory Visit: Payer: Medicaid Other | Admitting: *Deleted

## 2018-02-04 ENCOUNTER — Other Ambulatory Visit: Payer: Medicaid Other

## 2018-02-04 VITALS — BP 148/80 | HR 53

## 2018-02-04 DIAGNOSIS — I10 Essential (primary) hypertension: Secondary | ICD-10-CM

## 2018-02-04 DIAGNOSIS — B20 Human immunodeficiency virus [HIV] disease: Secondary | ICD-10-CM

## 2018-02-04 NOTE — Progress Notes (Signed)
Thank you, Sharyn Lull. BP reading is in a safe range today but looks like his PCP/nephrology team has a little more work to do to manage. He may have a headache due to a side effect of starting a new BP med, depending on what he is on. Do you need meed to route this to Dr. Loletha Grayer?

## 2018-02-04 NOTE — Progress Notes (Signed)
Patient here for bloodwork, will follow up with [facility] 11/19.  He asked to speak with a nurse for blood pressure check. He feels his blood pressure has been high, as he wakes up with mild but persistent headaches.  He states his nephrologist Dr Marval Regal has started him on "some water pill" but he does know the name (furosemide 20 mg filled at [facility] on 01/15/18).  He asked that his blood pressure reading be sent to his primary care physician and Dr Marval Regal.   Blood pressure obtained after having patient sit quietly for 5 minutes.  148/80, pulse 53.  Landis Gandy, RN

## 2018-02-05 LAB — T-HELPER CELL (CD4) - (RCID CLINIC ONLY)
CD4 T CELL HELPER: 12 % — AB (ref 33–55)
CD4 T Cell Abs: 360 /uL — ABNORMAL LOW (ref 400–2700)

## 2018-02-06 DIAGNOSIS — N189 Chronic kidney disease, unspecified: Secondary | ICD-10-CM | POA: Diagnosis not present

## 2018-02-06 DIAGNOSIS — I129 Hypertensive chronic kidney disease with stage 1 through stage 4 chronic kidney disease, or unspecified chronic kidney disease: Secondary | ICD-10-CM | POA: Diagnosis not present

## 2018-02-06 DIAGNOSIS — N183 Chronic kidney disease, stage 3 (moderate): Secondary | ICD-10-CM | POA: Diagnosis not present

## 2018-02-06 DIAGNOSIS — R748 Abnormal levels of other serum enzymes: Secondary | ICD-10-CM | POA: Diagnosis not present

## 2018-02-06 DIAGNOSIS — Z7689 Persons encountering health services in other specified circumstances: Secondary | ICD-10-CM | POA: Diagnosis not present

## 2018-02-06 LAB — HIV-1 RNA QUANT-NO REFLEX-BLD
HIV 1 RNA Quant: <20 copies/mL — AB
HIV-1 RNA Quant, Log: <1.3 Log copies/mL — AB

## 2018-02-06 LAB — COMPREHENSIVE METABOLIC PANEL
AG Ratio: 1.1 (calc) (ref 1.0–2.5)
ALKALINE PHOSPHATASE (APISO): 100 U/L (ref 40–115)
ALT: 26 U/L (ref 9–46)
AST: 26 U/L (ref 10–35)
Albumin: 3.9 g/dL (ref 3.6–5.1)
BILIRUBIN TOTAL: 0.6 mg/dL (ref 0.2–1.2)
BUN/Creatinine Ratio: 10 (calc) (ref 6–22)
BUN: 17 mg/dL (ref 7–25)
CHLORIDE: 108 mmol/L (ref 98–110)
CO2: 28 mmol/L (ref 20–32)
CREATININE: 1.72 mg/dL — AB (ref 0.70–1.33)
Calcium: 9.1 mg/dL (ref 8.6–10.3)
Globulin: 3.6 g/dL (calc) (ref 1.9–3.7)
Glucose, Bld: 101 mg/dL — ABNORMAL HIGH (ref 65–99)
Potassium: 4.4 mmol/L (ref 3.5–5.3)
Sodium: 143 mmol/L (ref 135–146)
Total Protein: 7.5 g/dL (ref 6.1–8.1)

## 2018-02-06 LAB — CBC
HCT: 43 % (ref 38.5–50.0)
HEMOGLOBIN: 14.5 g/dL (ref 13.2–17.1)
MCH: 33.3 pg — ABNORMAL HIGH (ref 27.0–33.0)
MCHC: 33.7 g/dL (ref 32.0–36.0)
MCV: 98.9 fL (ref 80.0–100.0)
MPV: 10.7 fL (ref 7.5–12.5)
PLATELETS: 209 10*3/uL (ref 140–400)
RBC: 4.35 10*6/uL (ref 4.20–5.80)
RDW: 12.8 % (ref 11.0–15.0)
WBC: 6.5 10*3/uL (ref 3.8–10.8)

## 2018-02-12 ENCOUNTER — Other Ambulatory Visit: Payer: Self-pay | Admitting: Infectious Diseases

## 2018-02-12 DIAGNOSIS — B20 Human immunodeficiency virus [HIV] disease: Secondary | ICD-10-CM

## 2018-02-12 NOTE — Unmapped (Signed)
[facility] Refill Coordination Note  Specialty Medication(s): humira cf 40mg /0.43ml  Additional Medications shipped: none    Dustin Prince, DOB: 12-Sep-1960  Phone: [phone] (home) , Alternate phone contact: N/A  Phone or address changes today?: No  All above HIPAA information was verified with patient.  Shipping Address: [address]  [place] [address]   Insurance changes? No    Completed refill call assessment today to schedule patient's medication shipment from the [facility] [phone]).      Confirmed the medication and dosage are correct and have not changed: Yes, regimen is correct and unchanged.    Confirmed patient started or stopped the following medications in the past month:  No, there are no changes reported at this time.    Are you tolerating your medication?:  Dustin Prince reports tolerating the medication.    ADHERENCE    (Below is required for Medicare Part B or Transplant patients only - per drug):   How many tablets were dispensed last month:    Patient currently has   remaining.    Did you miss any doses in the past 4 weeks? No missed doses reported.    FINANCIAL/SHIPPING    Delivery Scheduled: Yes, Expected medication delivery date: 02/19/18     Medication will be delivered via UPS to the home address in [facility].    The patient will receive a drug information handout for each medication shipped and additional FDA Medication Guides as required.      Dustin Prince did not have any additional questions at this time.    We will follow up with patient monthly for standard refill processing and delivery.      Thank you,  Unk Lightning   [facility] Specialty Technician

## 2018-02-17 ENCOUNTER — Encounter: Payer: Self-pay | Admitting: Infectious Diseases

## 2018-02-17 ENCOUNTER — Other Ambulatory Visit: Payer: Self-pay

## 2018-02-17 ENCOUNTER — Ambulatory Visit (INDEPENDENT_AMBULATORY_CARE_PROVIDER_SITE_OTHER): Payer: Medicaid Other | Admitting: Infectious Diseases

## 2018-02-17 VITALS — BP 176/82 | HR 65 | Temp 98.9°F | Wt 330.0 lb

## 2018-02-17 DIAGNOSIS — Z23 Encounter for immunization: Secondary | ICD-10-CM

## 2018-02-17 DIAGNOSIS — Z7185 Encounter for immunization safety counseling: Secondary | ICD-10-CM

## 2018-02-17 DIAGNOSIS — R030 Elevated blood-pressure reading, without diagnosis of hypertension: Secondary | ICD-10-CM

## 2018-02-17 DIAGNOSIS — Z7689 Persons encountering health services in other specified circumstances: Secondary | ICD-10-CM | POA: Diagnosis not present

## 2018-02-17 DIAGNOSIS — B20 Human immunodeficiency virus [HIV] disease: Secondary | ICD-10-CM | POA: Diagnosis not present

## 2018-02-17 DIAGNOSIS — Z7189 Other specified counseling: Secondary | ICD-10-CM | POA: Diagnosis not present

## 2018-02-17 NOTE — Patient Instructions (Addendum)
Viral Load is UNDETECTABLE - this means your [employer] Odor is working perfectly to keep the virus under good control.   CD4 Count (T-cells) is up to 360. This is EXCELLENT news! I think this is partially because your skin condition is under better control.   Liver function tests are all normal.   Please come back in 6 months with labs 2 weeks before.   I am very pleased at how the Humira has helped your skin!   I hope you have a wonderful holiday.

## 2018-02-17 NOTE — Progress Notes (Signed)
Name: Allen Espinoza  DOB: 08-May-1960 MRN: [id number] PCP: Welford Roche, MD    Patient Active Problem List   Diagnosis Date Noted  . Elevated blood pressure reading 02/17/2018  . Encounter for preventive care 11/19/2017  . Edema 08/20/2016  . 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 Morbid (severe) obesity due to excess calories ([place]) 08/19/2016  . Solitary pulmonary nodule 05/06/2016  . Hidradenitis suppurativa 04/27/2016  . Chest pain 04/26/2016  . Avascular necrosis of bone of hip, left s/p TRH ([place]) 03/19/2016  . Human immunodeficiency virus (HIV) disease ([place]) 03/18/2016  . Chronic kidney disease 03/18/2016  . Vaccine counseling 03/18/2016  . Cholelithiasis 03/18/2016  . Diverticulosis of colon without hemorrhage 03/18/2016     Brief Narrative:  Allen Espinoza is a 57 y.o. male with HIV infection. Originally diagnosed in 2007 in [place]. History of OIs: none. HIV Risk: heterosexual   Previous Regimens: Jorje Guild + Prezista >> suppressed (changed due to DDI with Xarelto) . Biktarvy >> suppressed   Genotypes: . No mutations   Subjective:  CC: Follow up on HIV care. No complaints. Wants to review labs.   HPI/ROS:  Feeling very well today but worried about his fiance - she apparently has a pulmonary embolism and needs further testing done. He has done well on his Biktarvy and reports 100% adherence. He started a new shot for his hidradenitis and is also on an antibiotic once a day. He reports excellent control with this medication and has not had any side effects. Reports no complaints today suggestive of associated opportunistic infection or advancing HIV disease such as fevers, night sweats, weight loss, anorexia, cough, SOB, nausea, vomiting, diarrhea, headache, sensory changes, lymphadenopathy or oral thrush.    Needs to try to lose some weight. Difficult to control diet and unable to exercise much with  L hip plan s/p replacement. Has had the flu shot this year.   Review of Systems  Constitutional: Negative for chills and fever.  HENT: Negative for tinnitus.   Eyes: Negative for blurred vision and photophobia.  Respiratory: Negative for cough and sputum production.   Cardiovascular: Negative for chest pain.  Gastrointestinal: Negative for diarrhea, nausea and vomiting.  Genitourinary: Negative for dysuria.  Skin: Negative for rash.  Neurological: Negative for headaches.    Past Medical History:  Diagnosis Date  . Abscess    Between legs  . Arthritis    all over  . Atrial fibrillation ([place])   . Avascular necrosis of femoral head, left ([place])   . Cholelithiasis   . CKD (chronic kidney disease) stage 3, GFR 30-59 ml/min (HCC)    sees kidney Dr.  . Diverticulosis   . DVT (deep venous thrombosis) (HCC)    legs  . Dyspnea    when walking  . Dysrhythmia    remembers mother taking about having an irregular rhythm years when he was a child   . GERD (gastroesophageal reflux disease)   . HIV (human immunodeficiency virus infection) ([place])   . Morbid obesity ([place])     Outpatient Medications Prior to Visit  Medication Sig Dispense Refill  . Adalimumab 40 MG/0.4ML PNKT INJECT THE CONTENTS OF 1 PEN (40MG ) UNDER THE SKIN ONCE EACH WEEK    . Adalimumab 80 MG/0.8ML PNKT INJECT THE CONTENTS OF 2 PENS (160MG ) UNDER THE SKIN ON DAY 1 THEN INJECT 1 PEN (80MG ) ON DAY 15.    . albuterol (PROVENTIL HFA;VENTOLIN  HFA) 108 (90 Base) MCG/ACT inhaler Inhale 2 puffs into the lungs daily as needed for wheezing or shortness of breath.    Marland Kitchen BIKTARVY 50-200-25 MG TABS tablet TAKE 1 TABLET BY MOUTH ONCE DAILY 240 tablet 0  . furosemide (LASIX) 20 MG tablet Take 20 mg by mouth daily.  6  . metroNIDAZOLE (FLAGYL) 250 MG tablet Take 250 mg by mouth 3 (three) times daily.    Marland Kitchen omeprazole (PRILOSEC) 40 MG capsule Take 1 capsule (40 mg total) by mouth daily. 60 capsule 2  . Radiation protection practitioner (GUARDIAN SHARPS  COLLECTOR) MISC USE AS DIRECTED    . simethicone (GAS-X) 80 MG chewable tablet Chew 1 tablet (80 mg total) by mouth 4 (four) times daily as needed for flatulence. 100 tablet 2  . XARELTO 20 MG TABS tablet TAKE 1 TABLET BY MOUTH ONCE DAILY WITH SUPPER 90 tablet 3   No facility-administered medications prior to visit.      Allergies  Allergen Reactions  . Sulfa Antibiotics Other (See Comments)    Low blood pressure  . Vancomycin Itching    Give with benadryl    Social History   Tobacco Use  . Smoking status: Never Smoker  . Smokeless tobacco: Never Used  Substance Use Topics  . Alcohol use: No    Comment: prior  . Drug use: No    Comment: prior cocaine use, last 2005    Family History  Problem Relation Age of Onset  . Heart attack Mother   . Heart attack Father   . Prostate cancer Brother   . Bone cancer Maternal Aunt   . Breast cancer Maternal Aunt   . Colon cancer Neg Hx   . Esophageal cancer Neg Hx   . Stomach cancer Neg Hx   . Rectal cancer Neg Hx     Social History   Substance and Sexual Activity  Sexual Activity Yes  . Partners: Female  . Birth control/protection: Condom     Objective:   Vitals:   02/17/18 0935  BP: (!) 176/82  Pulse: 65  Temp: 98.9 F (37.2 C)  TempSrc: Oral  Weight: (!) 330 lb (149.7 kg)   Body mass index is 44.76 kg/m.  Physical Exam  Constitutional: He is oriented to person, place, and time. He appears well-developed and well-nourished.  Pleasant. Seated comfortable in chair. Appears well today.   HENT:  Mouth/Throat: No oral lesions. Normal dentition. No dental caries. No oropharyngeal exudate.  Eyes: No scleral icterus.  Cardiovascular: Normal rate, regular rhythm and normal heart sounds.  Pulmonary/Chest: Effort normal and breath sounds normal.  Abdominal: Soft. He exhibits no distension. There is no tenderness.  Lymphadenopathy:    He has no cervical adenopathy.  Neurological: He is alert and oriented to person,  place, and time.  Skin: Skin is warm and dry. No rash noted.  Vitals reviewed.   Lab Results Lab Results  Component Value Date   WBC 6.5 02/04/2018   HGB 14.5 02/04/2018   HCT 43.0 02/04/2018   MCV 98.9 02/04/2018   PLT 209 02/04/2018    Lab Results  Component Value Date   CREATININE 1.72 (H) 02/04/2018   BUN 17 02/04/2018   NA 143 02/04/2018   K 4.4 02/04/2018   CL 108 02/04/2018   CO2 28 02/04/2018    Lab Results  Component Value Date   ALT 26 02/04/2018   AST 26 02/04/2018   ALKPHOS 86 09/02/2017   BILITOT 0.6 02/04/2018    Lab  Results  Component Value Date   CHOL 142 05/01/2017   HDL 62 05/01/2017   LDLCALC 67 05/01/2017   TRIG 57 05/01/2017   CHOLHDL 2.3 05/01/2017   HIV 1 RNA Quant (copies/mL)  Date Value  02/04/2018 <20 DETECTED (A)  05/01/2017 <20 NOT DETECTED  11/19/2016 <20 NOT DETECTED   HIV-1 RNA Viral Load (copies/mL)  Date Value  03/18/2016 47,300   CD4 T Cell Abs (/uL)  Date Value  02/04/2018 360 (L)  05/01/2017 190 (L)  08/07/2016 160 (L)    Assessment & Plan:   Problem List Items Addressed This Visit      Unprioritized   Elevated blood pressure reading    He has had several encounters with elevated BP. He is working with PCP and nephrologist on treatment. I advised that considering his kidney function and chronicity of BPs > 140/90 he would benefit from treatment for this. Also counseled to consider reducing weight to help with not only his BP but overall metabolic health. He is contemplating this and will look into resources discussed.       Human immunodeficiency virus (HIV) disease ([place]) - Primary    Under excellent durable control with ART. Doing very well on Biktarvy once daily. He again has undetectable viral load and suprisingly his CD4 is 360 today. I presume that this is in part due to the fact that the Humira and antibiotics are controlling his hidradenitis very well and he has not had any outbreaks in some time. Will have  him return in 6 months with labs prior to and continue to follow. Theoretically on immunomodulating agent would expect him to be at higher risk for opportunistic infections especially considering his HIV; continue to monitor for OIs.       Relevant Orders   T-helper cell (CD4)- ([facility] clinic only)   HIV-1 RNA quant-no reflex-bld   Lipid panel   RPR   Pneumococcal conjugate vaccine 13-valent IM (Completed)   Vaccine counseling    Prevnar vaccine today.       Relevant Orders   Pneumococcal conjugate vaccine 13-valent IM (Completed)     Return in about 6 months (around 08/18/2018).   Janene Madeira, MSN, NP-C [name] Pager: [phone] Office: [phone]  02/17/18  1:17 PM

## 2018-02-17 NOTE — Telephone Encounter (Signed)
Duplicate

## 2018-02-17 NOTE — Assessment & Plan Note (Signed)
Prevnar vaccine today.

## 2018-02-17 NOTE — Assessment & Plan Note (Signed)
He has had several encounters with elevated BP. He is working with PCP and nephrologist on treatment. I advised that considering his kidney function and chronicity of BPs > 140/90 he would benefit from treatment for this. Also counseled to consider reducing weight to help with not only his BP but overall metabolic health. He is contemplating this and will look into resources discussed.

## 2018-02-17 NOTE — Assessment & Plan Note (Signed)
Under excellent durable control with ART. Doing very well on Biktarvy once daily. He again has undetectable viral load and suprisingly his CD4 is 360 today. I presume that this is in part due to the fact that the Humira and antibiotics are controlling his hidradenitis very well and he has not had any outbreaks in some time. Will have him return in 6 months with labs prior to and continue to follow. Theoretically on immunomodulating agent would expect him to be at higher risk for opportunistic infections especially considering his HIV; continue to monitor for OIs.

## 2018-02-18 ENCOUNTER — Ambulatory Visit: Payer: Medicaid Other | Admitting: Infectious Diseases

## 2018-02-18 MED FILL — HUMIRA PEN CITRATE FREE 40 MG/0.4 ML: 28 days supply | Qty: 4 | Fill #3 | Status: AC

## 2018-02-18 MED FILL — HUMIRA PEN CITRATE FREE 40 MG/0.4 ML: 28 days supply | Qty: 4 | Fill #3

## 2018-02-20 ENCOUNTER — Encounter: Payer: Self-pay | Admitting: Cardiovascular Disease

## 2018-02-20 ENCOUNTER — Ambulatory Visit: Payer: Medicaid Other | Admitting: Cardiovascular Disease

## 2018-02-20 VITALS — BP 132/78 | HR 48 | Ht 72.0 in | Wt 330.6 lb

## 2018-02-20 DIAGNOSIS — Z7689 Persons encountering health services in other specified circumstances: Secondary | ICD-10-CM | POA: Diagnosis not present

## 2018-02-20 DIAGNOSIS — R072 Precordial pain: Secondary | ICD-10-CM

## 2018-02-20 NOTE — Patient Instructions (Signed)
Medication Instructions:  Your physician recommends that you continue on your current medications as directed. Please refer to the Current Medication list given to you today.  If you need a refill on your cardiac medications before your next appointment, please call your pharmacy.   Lab work: none If you have labs (blood work) drawn today and your tests are completely normal, you will receive your results only by: Marland Kitchen MyChart Message (if you have MyChart) OR . A paper copy in the mail If you have any lab test that is abnormal or we need to change your treatment, we will call you to review the results.  Testing/Procedures: none  Follow-Up: . As needed with Dr. Angelena Form  Any Other Special Instructions Will Be Listed Below (If Applicable).

## 2018-02-20 NOTE — Progress Notes (Signed)
Chief Complaint  Patient presents with  . New Patient (Initial Visit)    chest pain   History of Present Illness: 57 yo male with history of chronic kidney disease, DVT, GERD, HIV and morbid obesity here today as a new consult for the evaluation of chest pain. He has history of DVT and is on Xarelto. IVC filter in place. He has seen Vascular surgery and their recommendation is to leave it in place since it has been in for ten years. He has stage 3 chronic kidney disease and follows in Nephrology with Dr. Amedeo Gory. He has HIV and follows in the ID clinic. Echo July 2018 with LVEF=60-65%, moderate LVH, grade 1 diastolic dysfunction. Of note, atrial fibrillation is listed in his medical history. I reviewed an EKG from January 2019 that states "atrial fibrillation" in the computer interpretation but this EKG clearly shows sinus rhythm. He does not recall ever being told he had atrial fibrillation.   He tells me today that he has pain in the left side of his chest. The pain occurs after eating. The pain is relieved with moving his arm. The pain lasts for a few minutes and resolves on it's own. This occurs several times per month. He has no exertional chest pain. No associated dyspnea, N/V, diaphoresis. No prior cardiac disease. He has had surgery on his left axilla for hydradenitis in July 2018. The pain has been occurring since that surgery. He has tried PPI in primary care with little relief.   Primary Care Physician: Welford Roche, MD   Past Medical History:  Diagnosis Date  . Abscess    Between legs  . Arthritis    all over  . Avascular necrosis of femoral head, left ([place])   . Cholelithiasis   . CKD (chronic kidney disease) stage 3, GFR 30-59 ml/min (HCC)    sees kidney Dr.  . Diverticulosis   . DVT (deep venous thrombosis) (HCC)    legs  . Dyspnea    when walking  . Dysrhythmia    remembers mother taking about having an irregular rhythm years when he was a child   . GERD  (gastroesophageal reflux disease)   . HIV (human immunodeficiency virus infection) ([place])   . Morbid obesity ([place])     Past Surgical History:  Procedure Laterality Date  . DENTAL SURGERY     had teeth pulled  . HYDRADENITIS EXCISION Left 10/14/2016   Procedure: WIDE EXCISION HIDRADENITIS LEFT AXILLA;  Surgeon: Coralie Keens, MD;  Location: [facility];  Service: General;  Laterality: Left;  . JOINT REPLACEMENT     Left hip Dr. Ninfa Linden 11/29/16  . TOTAL HIP ARTHROPLASTY Left 11/29/2016   Procedure: LEFT TOTAL HIP ARTHROPLASTY ANTERIOR APPROACH;  Surgeon: [name], MD;  Location: [facility];  Service: Orthopedics;  Laterality: Left;    Current Outpatient Medications  Medication Sig Dispense Refill  . Adalimumab 40 MG/0.4ML PNKT INJECT THE CONTENTS OF 1 PEN (40MG ) UNDER THE SKIN ONCE EACH WEEK    . albuterol (PROVENTIL HFA;VENTOLIN HFA) 108 (90 Base) MCG/ACT inhaler Inhale 2 puffs into the lungs daily as needed for wheezing or shortness of breath.    Marland Kitchen BIKTARVY 50-200-25 MG TABS tablet TAKE 1 TABLET BY MOUTH ONCE DAILY 240 tablet 0  . furosemide (LASIX) 20 MG tablet Take 20 mg by mouth daily.  6  . Radiation protection practitioner (GUARDIAN SHARPS COLLECTOR) MISC USE AS DIRECTED    . XARELTO 20 MG TABS tablet TAKE 1 TABLET BY MOUTH  ONCE DAILY WITH SUPPER 90 tablet 3   No current facility-administered medications for this visit.     Allergies  Allergen Reactions  . Sulfa Antibiotics Other (See Comments)    Low blood pressure  . Vancomycin Itching    Give with benadryl    Social History   Socioeconomic History  . Marital status: Single    Spouse name: Not on file  . Number of children: 2  . Years of education: Not on file  . Highest education level: Not on file  Occupational History  . Occupation: Disability  Social Needs  . Financial resource strain: Hard  . Food insecurity:    Worry: Sometimes true    Inability: Sometimes true  . Transportation needs:    Medical: No     Non-medical: No  Tobacco Use  . Smoking status: Never Smoker  . Smokeless tobacco: Never Used  Substance and Sexual Activity  . Alcohol use: No    Comment: prior  . Drug use: No    Comment: prior cocaine use, last 2005  . Sexual activity: Yes    Partners: Female    Birth control/protection: Condom  Lifestyle  . Physical activity:    Days per week: 2 days    Minutes per session: 10 min  . Stress: Only a little  Relationships  . Social connections:    Talks on phone: Three times a week    Gets together: More than three times a week    Attends religious service: Never    Active member of club or organization: No    Attends meetings of clubs or organizations: Never    Relationship status: Living with partner  . Intimate partner violence:    Fear of current or ex partner: No    Emotionally abused: No    Physically abused: No    Forced sexual activity: No  Other Topics Concern  . Not on file  Social History Narrative  . Not on file    Family History  Problem Relation Age of Onset  . Heart attack Mother 3  . Heart attack Father 76  . Prostate cancer Brother   . Bone cancer Maternal Aunt   . Breast cancer Maternal Aunt   . Colon cancer Neg Hx   . Esophageal cancer Neg Hx   . Stomach cancer Neg Hx   . Rectal cancer Neg Hx     Review of Systems:  As stated in the HPI and otherwise negative.   BP 132/78   Pulse (!) 48   Ht 6' (1.829 m)   Wt (!) 330 lb 9.6 oz (150 kg)   SpO2 98%   BMI 44.84 kg/m   Physical Examination: General: Obese male in NAD.  HEENT: OP clear, mucus membranes moist  SKIN: warm, dry. No rashes. Neuro: No focal deficits  Musculoskeletal: Muscle strength 5/5 all ext  Psychiatric: Mood and affect normal  Neck: No JVD, no carotid bruits, no thyromegaly, no lymphadenopathy.  Lungs:Clear bilaterally, no wheezes, rhonci, crackles Cardiovascular: Regular rate and rhythm. No murmurs, gallops or rubs. Abdomen:Soft. Bowel sounds present. Non-tender.    Extremities: No lower extremity edema. Pulses are 2 + in the bilateral DP/PT.  Echo July 2018: - Left ventricle: The cavity size was normal. There was moderate   concentric hypertrophy. Systolic function was normal. The   estimated ejection fraction was in the range of 60% to 65%. Wall   motion was normal; there were no regional wall motion   abnormalities.  Doppler parameters are consistent with abnormal   left ventricular relaxation (grade 1 diastolic dysfunction). The   E/e&' ratio is between 8-15, suggesting indeterminate LV filling   pressure. - Left atrium: The atrium was normal in size. - Tricuspid valve: There was mild regurgitation. - Pulmonary arteries: PA peak pressure: 37 mm Hg (S). - Inferior vena cava: The vessel was normal in size. The   respirophasic diameter changes were in the normal range (>= 50%),   consistent with normal central venous pressure.  Impressions:  - LVEF 60-65%, moderate LVH, normal wall motion, grade 1 DD with   indeterminate LV filling pressure, normal LA size, mild TR, RVSP   37 mmHg, normal IVC.  EKG:  EKG is ordered today. The ekg ordered today demonstrates sinus brady, rate 48 bpm  Recent Labs: 09/02/2017: B Natriuretic Peptide 36.7 02/04/2018: ALT 26; BUN 17; Creat 1.72; Hemoglobin 14.5; Platelets 209; Potassium 4.4; Sodium 143   Lipid Panel    Component Value Date/Time   CHOL 142 05/01/2017 1005   CHOL 137 03/18/2016 1539   TRIG 57 05/01/2017 1005   HDL 62 05/01/2017 1005   HDL 64 03/18/2016 1539   CHOLHDL 2.3 05/01/2017 1005   VLDL 16 08/07/2016 1004   LDLCALC 67 05/01/2017 1005     Wt Readings from Last 3 Encounters:  02/20/18 (!) 330 lb 9.6 oz (150 kg)  02/17/18 (!) 330 lb (149.7 kg)  12/24/17 (!) 323 lb 4.8 oz (146.6 kg)     Other studies Reviewed: Additional studies/ records that were reviewed today include:  Review of the above records demonstrates:    Assessment and Plan:   1. Chest pain: His pain is atypical  and does not sound cardiac. EKG today shows no ischemic changes. I would not pursue a Coronary CTA given his chronic kidney disease and risk of contrast nephropathy. We discussed a nuclear stress test if his symptoms change only. He will call back if there is any change in his symptoms. At this time, will plan follow up as needed.   Current medicines are reviewed at length with the patient today.  The patient does not have concerns regarding medicines.  The following changes have been made:  no change  Labs/ tests ordered today include:  No orders of the defined types were placed in this encounter.    Disposition:   FU as needed.    Signed, Lauree Chandler, MD 02/20/2018 9:26 AM    [facility] [place], [place], [place]  [address] Phone: [phone]; Fax: [phone]

## 2018-03-03 ENCOUNTER — Ambulatory Visit: Payer: Medicaid Other | Admitting: Internal Medicine

## 2018-03-03 ENCOUNTER — Encounter: Payer: Self-pay | Admitting: Internal Medicine

## 2018-03-03 ENCOUNTER — Telehealth: Payer: Self-pay | Admitting: *Deleted

## 2018-03-03 VITALS — BP 146/72 | HR 59 | Temp 98.0°F | Ht 72.0 in | Wt 327.7 lb

## 2018-03-03 DIAGNOSIS — R0683 Snoring: Secondary | ICD-10-CM | POA: Diagnosis not present

## 2018-03-03 DIAGNOSIS — G44209 Tension-type headache, unspecified, not intractable: Secondary | ICD-10-CM | POA: Diagnosis not present

## 2018-03-03 MED ORDER — CYCLOBENZAPRINE HCL 5 MG PO TABS: 5.0000 mg | ORAL_TABLET | Freq: Every day | ORAL | 0 refills | Status: DC

## 2018-03-03 NOTE — Telephone Encounter (Signed)
Pt and his sig other call and state pt is progressively getting worse with sleep problems, restless, heart racing, waking short of breath, some chest discomfort. This is ongoing but has fallen out of bed recently and is flailing around more. [facility] appt given 12/3 in pm

## 2018-03-03 NOTE — Patient Instructions (Signed)
Mr. Veley,  Is a pleasure meeting you today. A referral has been made for you to have a sleep study.  Please try Flexeril once at night to help with your tension headaches.

## 2018-03-03 NOTE — Progress Notes (Signed)
   CC: snoring loudly and apneic during sleep  HPI:  [name] is a 57 y.o. male with history noted below that presents to the acute care clinic for referral for sleep study.  Patient is accompanied by his significant other that helps provide history. Patient's significant other states that patient has been snoring loudly and she has witnessed patient become apneic during sleep. Her and the patient are concerned for sleep apnea.  Past Medical History:  Diagnosis Date  . Abscess    Between legs  . Arthritis    all over  . Avascular necrosis of femoral head, left ([place])   . Cholelithiasis   . CKD (chronic kidney disease) stage 3, GFR 30-59 ml/min (HCC)    sees kidney Dr.  . Diverticulosis   . DVT (deep venous thrombosis) (HCC)    legs  . Dyspnea    when walking  . Dysrhythmia    remembers mother taking about having an irregular rhythm years when he was a child   . GERD (gastroesophageal reflux disease)   . HIV (human immunodeficiency virus infection) ([place])   . Morbid obesity ([place])     Review of Systems:  Review of Systems  Constitutional: Negative for malaise/fatigue.  Respiratory: Negative for shortness of breath.   Neurological: Positive for headaches.     Physical Exam:  Vitals:   03/03/18 1448  BP: (!) 146/72  Pulse: (!) 59  Temp: 98 F (36.7 C)  TempSrc: Oral  SpO2: 98%  Weight: (!) 327 lb 11.2 oz (148.6 kg)  Height: 6' (1.829 m)   Physical Exam  Constitutional: He is well-developed, well-nourished, and in no distress.  Cardiovascular: Normal rate, regular rhythm and normal heart sounds. Exam reveals no gallop and no friction rub.  No murmur heard. Pulmonary/Chest: Effort normal and breath sounds normal. No respiratory distress. He has no wheezes. He has no rales.  Musculoskeletal:  Tenderness and tightness to palpation along the cervical paraspinal musculature  Skin: Skin is warm and dry.     Assessment & Plan:   See encounters tab for problem  based medical decision making.   Patient discussed with Dr. Angelia Mould

## 2018-03-05 DIAGNOSIS — G479 Sleep disorder, unspecified: Secondary | ICD-10-CM | POA: Insufficient documentation

## 2018-03-05 DIAGNOSIS — G44209 Tension-type headache, unspecified, not intractable: Secondary | ICD-10-CM | POA: Insufficient documentation

## 2018-03-05 DIAGNOSIS — R0683 Snoring: Principal | ICD-10-CM

## 2018-03-05 NOTE — Assessment & Plan Note (Signed)
Assessment: Snoring Stop-Bang score is 6 placing patient at high risk of OSA.  Will place referral for sleep study.  Plan -Referral for sleep study

## 2018-03-05 NOTE — Assessment & Plan Note (Signed)
HPI:  Patient reports headaches several times during the week. He reports a throbbing sensation in the front of his head and sometimes localizes to the right eye. Light or noise does not make it worse. He states that when he stretches his neck the headache improves. On exam there is tenderness and tightness to the cervical paraspinal musculature. Will give a short course of Flexeril to see if this helps. Also recommended using Tylenol as patient has CKD and will avoid NSAIDs at this time.   Assessment: Tension headache  Plan -Flexeril -Tylenol

## 2018-03-05 NOTE — Progress Notes (Signed)
Internal Medicine Clinic Attending  Case discussed with Dr. Hoffman at the time of the visit.  We reviewed the resident's history and exam and pertinent patient test results.  I agree with the assessment, diagnosis, and plan of care documented in the resident's note.  

## 2018-03-12 NOTE — Unmapped (Signed)
Pt declined speaking with R[facility]- he did ask for number of office- he's curious to know how long he may be on the humira- I told him the best person to ask would be the physician    [facility] Refill Coordination Note    Specialty Medication(s) to be Shipped:   Inflammatory Disorders: Humira    Other medication(s) to be shipped: n/a     Dustin Prince, DOB: May 10, 1960  Phone: [phone] (home)       All above HIPAA information was verified with patient.     Completed refill call assessment today to schedule patient's medication shipment from the [facility] [phone]).       Specialty medication(s) and dose(s) confirmed: Regimen is correct and unchanged.   Changes to medications: Dustin Prince reports no changes reported at this time.  Changes to insurance: No  Questions for the pharmacist: No    The patient will receive a drug information handout for each medication shipped and additional FDA Medication Guides as required.      DISEASE/MEDICATION-SPECIFIC INFORMATION        For patients on injectable medications: Patient currently has 2 doses left.  Next injection is scheduled for next tuesday.    SPECIALTY MEDICATION ADHERENCE     Medication Adherence    Patient reported X missed doses in the last month:  0  Specialty Medication:  humira  Patient is on additional specialty medications:  No  Patient is on more than two specialty medications:  No  Any gaps in refill history greater than 2 weeks in the last 3 months:  no  Demonstrates understanding of importance of adherence:  yes  Informant:  patient  Reliability of informant:  reliable          Support network for adherence:  family member      Confirmed plan for next specialty medication refill:  delivery by pharmacy  Refills needed for supportive medications:  not needed          Refill Coordination    Has the Patients' Contact Information Changed:  No  Is the Shipping Address Different:  No       humira 40mg /0.69ml injection: patient has 14 days on hand      SHIPPING     Shipping address confirmed in Epic.     Delivery Scheduled: Yes, Expected medication delivery date: 12/18.     Medication will be delivered via UPS to the home address in Epic WAM.    [name]   [facility] Specialty Technician

## 2018-03-17 MED FILL — HUMIRA PEN CITRATE FREE 40 MG/0.4 ML: 28 days supply | Qty: 4 | Fill #4

## 2018-03-17 MED FILL — HUMIRA PEN CITRATE FREE 40 MG/0.4 ML: 28 days supply | Qty: 4 | Fill #4 | Status: AC

## 2018-04-03 ENCOUNTER — Telehealth: Payer: Self-pay | Admitting: Internal Medicine

## 2018-04-03 NOTE — Telephone Encounter (Signed)
Patient called in requesting Dr. Frederico Hamman write him a letter on why he needs a companion to ride with him on [employer].  Please make letter out to whom it may concern, ATTN: Transportation, and fax to [phone].  If you have any questions please feel free to give a call back [phone].

## 2018-04-07 NOTE — Telephone Encounter (Signed)
Patient brought paperwork for me to fill out. I never wrote a letter. I am happy to fill out paperwork if he brings it in. No need for an appt.

## 2018-04-07 NOTE — Telephone Encounter (Signed)
No response from doctor.  She will be in clinic again this Friday 04/10/2018.  Resending to her along with her nurse for clinic that day.

## 2018-04-08 NOTE — Telephone Encounter (Signed)
Sorry for the confusion-pt is requesting a letter, not additional paperwork. Why does he need a companion to accompany him on medicaid transportation- is the question the letter needs to answer.  See previous note below for details, thank you.[name], Allen Bakos Cassady1/8/202010:34 AM

## 2018-04-10 ENCOUNTER — Encounter: Payer: Self-pay | Admitting: Internal Medicine

## 2018-04-10 NOTE — Telephone Encounter (Signed)
Letter printed and signed. Dropped it in the correspondence box.

## 2018-04-13 ENCOUNTER — Encounter: Payer: Self-pay | Admitting: *Deleted

## 2018-04-15 NOTE — Unmapped (Signed)
Dustin Prince reports he's been having an HS flare since stopping the metronidazole (note says continue for 2 months, rx written for 6 months). I advised he should follow up with clinic - provided appointment number.    Of note, he has recently been diagnosed with CKD - had edema, now on furosemide. He reports this has helped with his LEE. He was seen by cardiology - they are aware he's on Humira, sounds like edema was from CKD rather than CHF picture.    [facility] Refill and Clinical Coordination Note  Medication(s): [facility], DOB: [birth year]  Phone: [phone] (home) , Alternate phone contact: N/A  Shipping address: [address]  [place] [address]  Phone or address changes today?: No  All above HIPAA information verified.  Insurance changes? No    Completed refill and clinical call assessment today to schedule patient's medication shipment from the [facility] [phone]).      MEDICATION RECONCILIATION    Confirmed the medication and dosage are correct and have not changed: Yes, regimen is correct and unchanged.    Were there any changes to your medication(s) in the past month:  see above    ADHERENCE    Is this medicine transplant or covered by Medicare Part B? No.    Did you miss any doses in the past 4 weeks? No missed doses reported.  Adherence counseling provided? Not needed     SIDE EFFECT MANAGEMENT    Are you tolerating your medication?:  Dustin Prince reports tolerating the medication.  Side effect management discussed: None      Therapy is appropriate and should be continued.    Evidence of clinical benefit: Do you feel that that the medication is helping? Yes      FINANCIAL/SHIPPING    Delivery Scheduled: Yes, Expected medication delivery date: Friday, Jan 17     Medication will be delivered via UPS to the home address in [place].    Additional medications refilled: No additional medications/refills needed at this time.    The patient will receive a drug information handout for each medication shipped and additional FDA Medication Guides as required.      Dustin Prince did not have any additional questions at this time.    Delivery address confirmed in Epic.     We will follow up with patient monthly for standard refill processing and delivery.      Thank you,  Tawanna Solo [facility] Specialty Pharmacist

## 2018-04-16 ENCOUNTER — Ambulatory Visit: Payer: Medicaid Other | Admitting: Internal Medicine

## 2018-04-16 ENCOUNTER — Other Ambulatory Visit: Payer: Self-pay

## 2018-04-16 VITALS — BP 151/72 | HR 95 | Temp 98.2°F | Ht 72.0 in | Wt 336.0 lb

## 2018-04-16 DIAGNOSIS — I129 Hypertensive chronic kidney disease with stage 1 through stage 4 chronic kidney disease, or unspecified chronic kidney disease: Secondary | ICD-10-CM | POA: Diagnosis not present

## 2018-04-16 DIAGNOSIS — E669 Obesity, unspecified: Secondary | ICD-10-CM | POA: Diagnosis not present

## 2018-04-16 DIAGNOSIS — N183 Chronic kidney disease, stage 3 (moderate): Secondary | ICD-10-CM

## 2018-04-16 DIAGNOSIS — Z79899 Other long term (current) drug therapy: Secondary | ICD-10-CM | POA: Diagnosis not present

## 2018-04-16 DIAGNOSIS — Z6841 Body Mass Index (BMI) 40.0 and over, adult: Secondary | ICD-10-CM

## 2018-04-16 DIAGNOSIS — K219 Gastro-esophageal reflux disease without esophagitis: Secondary | ICD-10-CM

## 2018-04-16 DIAGNOSIS — J069 Acute upper respiratory infection, unspecified: Secondary | ICD-10-CM

## 2018-04-16 DIAGNOSIS — B9789 Other viral agents as the cause of diseases classified elsewhere: Principal | ICD-10-CM

## 2018-04-16 DIAGNOSIS — Z21 Asymptomatic human immunodeficiency virus [HIV] infection status: Secondary | ICD-10-CM

## 2018-04-16 MED ORDER — BENZONATATE 100 MG PO CAPS: 100.0000 mg | ORAL_CAPSULE | Freq: Three times a day (TID) | ORAL | 1 refills | Status: DC | PRN

## 2018-04-16 MED FILL — HUMIRA PEN CITRATE FREE 40 MG/0.4 ML: 28 days supply | Qty: 4 | Fill #5

## 2018-04-16 MED FILL — HUMIRA PEN CITRATE FREE 40 MG/0.4 ML: 28 days supply | Qty: 4 | Fill #5 | Status: AC

## 2018-04-16 NOTE — Patient Instructions (Signed)
Thank you for allowing [place] to provide your care today. Today we discussed your cough and it appears the cough was from a viral illness   I have provided you a spacer with instructions for your albueterol inhaler  I have also ordered cough pearls so you can get some relief  Please follow-up if your symptoms worsen.    Should you have any questions or concerns please call the internal medicine clinic at [phone].     Viral Respiratory Infection A viral respiratory infection is an illness that affects parts of the body that are used for breathing. These include the lungs, nose, and throat. It is caused by a germ called a virus. Some examples of this kind of infection are:  A cold.  The flu (influenza).  A respiratory syncytial virus (RSV) infection. A person who gets this illness may have the following symptoms:  A stuffy or runny nose.  Yellow or green fluid in the nose.  A cough.  Sneezing.  Tiredness (fatigue).  Achy muscles.  A sore throat.  Sweating or chills.  A fever.  A headache. Follow these instructions at home: Managing pain and congestion  Take over-the-counter and prescription medicines only as told by your doctor.  If you have a sore throat, gargle with salt water. Do this 3-4 times per day or as needed. To make a salt-water mixture, dissolve -1 tsp of salt in 1 cup of warm water. Make sure that all the salt dissolves.  Use nose drops made from salt water. This helps with stuffiness (congestion). It also helps soften the skin around your nose.  Drink enough fluid to keep your pee (urine) pale yellow. General instructions   Rest as much as possible.  Do not drink alcohol.  Do not use any products that have nicotine or tobacco, such as cigarettes and e-cigarettes. If you need help quitting, ask your doctor.  Keep all follow-up visits as told by your doctor. This is important. How is this prevented?   Get a flu shot every year. Ask your  doctor when you should get your flu shot.  Do not let other people get your germs. If you are sick: ? Stay home from work or school. ? Wash your hands with soap and water often. Wash your hands after you cough or sneeze. If soap and water are not available, use hand sanitizer.  Avoid contact with people who are sick during cold and flu season. This is in fall and winter. Get help if:  Your symptoms last for 10 days or longer.  Your symptoms get worse over time.  You have a fever.  You have very bad pain in your face or forehead.  Parts of your jaw or neck become very swollen. Get help right away if:  You feel pain or pressure in your chest.  You have shortness of breath.  You faint or feel like you will faint.  You keep throwing up (vomiting).  You feel confused. Summary  A viral respiratory infection is an illness that affects parts of the body that are used for breathing.  Examples of this illness include a cold, the flu, and respiratory syncytial virus (RSV) infection.  The infection can cause a runny nose, cough, sneezing, sore throat, and fever.  Follow what your doctor tells you about taking medicines, drinking lots of fluid, washing your hands, resting at home, and avoiding people who are sick. This information is not intended to replace advice given to you by your  health care provider. Make sure you discuss any questions you have with your health care provider. Document Released: 02/29/2008 Document Revised: 04/28/2017 Document Reviewed: 04/28/2017 Elsevier Interactive Patient Education  2019 [employer].

## 2018-04-17 ENCOUNTER — Encounter: Payer: Self-pay | Admitting: Internal Medicine

## 2018-04-17 DIAGNOSIS — B9789 Other viral agents as the cause of diseases classified elsewhere: Principal | ICD-10-CM

## 2018-04-17 DIAGNOSIS — J069 Acute upper respiratory infection, unspecified: Secondary | ICD-10-CM | POA: Insufficient documentation

## 2018-04-17 NOTE — Assessment & Plan Note (Signed)
Presents with complaints of sore throat, cough and malaise after recent sick contact. Endorsing improvement with time. Treated w/ OTC cough medicine. Wanted to get check up because of his HIV status (well controlled on Biktarvy CD4 360, viral load <20. Physical exam shows no significant findings. Most likely viral URI. Past the time window for tamiflu. Will recommend supportive care.   - Tessalon pearl for coughs - Recommend adequate hydration, tylenol for fevers - Does not have indication for albuterol use but has inhaler at home. Given spacer and instructions on proper use during clinic visit.

## 2018-04-17 NOTE — Progress Notes (Signed)
CC: Cough  HPI: [name] is a 58 y.o. M w/ PMH of HIV on Biktarvy (CD4 350) CKD3a, HTn, GERD and obesity presenting with complaints of cough, sore throat and malaise. He states he was in his usual state of health until his fiance got sick with common cold. He spent last week taking care of her and shortly after started to have cough, sore throat and fatigue. He thought it was a common cold but wanted to get 'checked out' due to his history of HIV. He states his symptoms began about 7 days ago and has been steadily improving. Denies any nausea, vomiting, diarrhea, constipation. Denies any fevers (mentions he recorded temp at home and highest was 99.30F) Denies any productive sputum but does endorse some chest congestion. He states he has albuterol inhaler at home but could not express for what lung disease. States whenever he uses it, it doesn't feel like the medication is going into his lungs.  Past Medical History:  Diagnosis Date  . Abscess    Between legs  . Arthritis    all over  . Avascular necrosis of femoral head, left ([place])   . Cholelithiasis   . CKD (chronic kidney disease) stage 3, GFR 30-59 ml/min (HCC)    sees kidney Dr.  . Diverticulosis   . DVT (deep venous thrombosis) (HCC)    legs  . Dyspnea    when walking  . Dysrhythmia    remembers mother taking about having an irregular rhythm years when he was a child   . GERD (gastroesophageal reflux disease)   . HIV (human immunodeficiency virus infection) ([place])   . Morbid obesity ([place])    Review of Systems: Review of Systems  Constitutional: Positive for malaise/fatigue. Negative for chills and fever.  HENT: Positive for sore throat. Negative for congestion.   Eyes: Negative for blurred vision.  Respiratory: Positive for cough. Negative for sputum production and shortness of breath.   Cardiovascular: Negative for chest pain and palpitations.  Gastrointestinal: Negative for constipation, diarrhea, nausea and  vomiting.  Neurological: Negative for dizziness, sensory change, weakness and headaches.     Physical Exam: Vitals:   04/16/18 0941  BP: (!) 151/72  Pulse: 95  Temp: 98.2 F (36.8 C)  TempSrc: Oral  SpO2: 98%  Weight: (!) 336 lb (152.4 kg)  Height: 6' (1.829 m)    Physical Exam  Constitutional: He is oriented to person, place, and time. He appears well-developed. No distress.  obese  HENT:  Mouth/Throat: Oropharynx is clear and moist. No oropharyngeal exudate.  Eyes: Conjunctivae are normal. No scleral icterus.  Neck: Normal range of motion. Neck supple. No JVD present.  Cardiovascular: Normal rate, regular rhythm, normal heart sounds and intact distal pulses.  No murmur heard. Respiratory: Effort normal. He has no wheezes. He has no rales.  GI: Soft. Bowel sounds are normal. He exhibits no distension. There is no abdominal tenderness.  Musculoskeletal: Normal range of motion.        General: No edema.  Lymphadenopathy:    He has no cervical adenopathy.  Neurological: He is alert and oriented to person, place, and time.  Skin: Skin is warm and dry.     Assessment & Plan:   Viral URI with cough Presents with complaints of sore throat, cough and malaise after recent sick contact. Endorsing improvement with time. Treated w/ OTC cough medicine. Wanted to get check up because of his HIV status (well controlled on Biktarvy CD4 360, viral load <  20. Physical exam shows no significant findings. Most likely viral URI. Past the time window for tamiflu. Will recommend supportive care.   - Tessalon pearl for coughs - Recommend adequate hydration, tylenol for fevers - Does not have indication for albuterol use but has inhaler at home. Given spacer and instructions on proper use during clinic visit.    Patient discussed with Dr. Dareen Piano   -Gilberto Better, PGY1

## 2018-04-20 DIAGNOSIS — Z7689 Persons encountering health services in other specified circumstances: Secondary | ICD-10-CM | POA: Diagnosis not present

## 2018-04-20 DIAGNOSIS — N189 Chronic kidney disease, unspecified: Secondary | ICD-10-CM | POA: Diagnosis not present

## 2018-04-20 DIAGNOSIS — N183 Chronic kidney disease, stage 3 (moderate): Secondary | ICD-10-CM | POA: Diagnosis not present

## 2018-04-20 NOTE — Progress Notes (Signed)
Internal Medicine Clinic Attending ° °Case discussed with Dr. Lee at the time of the visit.  We reviewed the resident’s history and exam and pertinent patient test results.  I agree with the assessment, diagnosis, and plan of care documented in the resident’s note.  °

## 2018-04-23 ENCOUNTER — Encounter: Payer: Self-pay | Admitting: Neurology

## 2018-04-23 ENCOUNTER — Ambulatory Visit: Payer: Medicaid Other | Admitting: Neurology

## 2018-04-23 VITALS — BP 158/77 | HR 57 | Ht 72.0 in | Wt 321.0 lb

## 2018-04-23 DIAGNOSIS — R600 Localized edema: Secondary | ICD-10-CM

## 2018-04-23 DIAGNOSIS — G4752 REM sleep behavior disorder: Secondary | ICD-10-CM

## 2018-04-23 DIAGNOSIS — R519 Headache, unspecified: Secondary | ICD-10-CM

## 2018-04-23 DIAGNOSIS — R0683 Snoring: Secondary | ICD-10-CM

## 2018-04-23 DIAGNOSIS — F515 Nightmare disorder: Secondary | ICD-10-CM | POA: Diagnosis not present

## 2018-04-23 DIAGNOSIS — R351 Nocturia: Secondary | ICD-10-CM | POA: Diagnosis not present

## 2018-04-23 DIAGNOSIS — Z6841 Body Mass Index (BMI) 40.0 and over, adult: Secondary | ICD-10-CM | POA: Diagnosis not present

## 2018-04-23 DIAGNOSIS — G478 Other sleep disorders: Secondary | ICD-10-CM | POA: Diagnosis not present

## 2018-04-23 DIAGNOSIS — Z7689 Persons encountering health services in other specified circumstances: Secondary | ICD-10-CM | POA: Diagnosis not present

## 2018-04-23 DIAGNOSIS — R0681 Apnea, not elsewhere classified: Secondary | ICD-10-CM

## 2018-04-23 DIAGNOSIS — R51 Headache: Secondary | ICD-10-CM
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
## 2018-04-23 NOTE — Patient Instructions (Addendum)

## 2018-04-29 DIAGNOSIS — I519 Heart disease, unspecified: Secondary | ICD-10-CM | POA: Diagnosis not present

## 2018-04-29 DIAGNOSIS — B20 Human immunodeficiency virus [HIV] disease: Secondary | ICD-10-CM | POA: Diagnosis not present

## 2018-04-29 DIAGNOSIS — L732 Hidradenitis suppurativa: Secondary | ICD-10-CM | POA: Diagnosis not present

## 2018-04-29 DIAGNOSIS — N183 Chronic kidney disease, stage 3 (moderate): Secondary | ICD-10-CM | POA: Diagnosis not present

## 2018-04-29 DIAGNOSIS — I129 Hypertensive chronic kidney disease with stage 1 through stage 4 chronic kidney disease, or unspecified chronic kidney disease: Secondary | ICD-10-CM | POA: Diagnosis not present

## 2018-04-29 DIAGNOSIS — Z7689 Persons encountering health services in other specified circumstances: Secondary | ICD-10-CM | POA: Diagnosis not present

## 2018-04-29 DIAGNOSIS — I82409 Acute embolism and thrombosis of unspecified deep veins of unspecified lower extremity: Secondary | ICD-10-CM | POA: Diagnosis not present

## 2018-04-29 DIAGNOSIS — M87052 Idiopathic aseptic necrosis of left femur: Secondary | ICD-10-CM | POA: Diagnosis not present

## 2018-05-05 ENCOUNTER — Encounter
Admit: 2018-05-05 | Discharge: 2018-05-06 | Payer: BLUE CROSS/BLUE SHIELD | Attending: Dermatology | Primary: Dermatology

## 2018-05-05 DIAGNOSIS — L732 Hidradenitis suppurativa: Secondary | ICD-10-CM | POA: Diagnosis not present

## 2018-05-05 MED ORDER — ADALIMUMAB PEN CITRATE FREE 40 MG/0.4 ML
11 refills | 0 days | Status: CP
Start: 2018-05-05 — End: 2019-05-05
  Filled 2018-05-20: qty 4, 28d supply, fill #0

## 2018-05-05 MED ORDER — METRONIDAZOLE 250 MG TABLET
ORAL_TABLET | Freq: Three times a day (TID) | ORAL | 2 refills | 0.00000 days | Status: CP
Start: 2018-05-05 — End: 2018-09-04

## 2018-05-05 NOTE — Unmapped (Signed)
ASSESSMENT/PLAN:    Dustin Prince was seen today for follow-up.    Diagnoses and all orders for this visit:    Hidradenitis suppurativa  -     Quantiferon TB Gold Plus; Future  -     metroNIDAZOLE (FLAGYL) 250 MG tablet; Take 1 tablet (250 mg total) by mouth Three (3) times a day.  -     ADALIMUMAB PEN CITRATE FREE 40 MG/0.4 ML; INJECT THE CONTENTS OF 1 PEN (40MG ) UNDER THE SKIN ONCE EACH WEEK  -     Quantiferon TB Gold Plus        Severe [place] 3 HS scrotal, inguinal and perirenal; somewhat improved on Humira, but flared with stopping metronidazole, in patient w/ hx of well controlled HIV    We discussed the typical natural history, pathogenesis, treatment options, and expected course as well as the relapsing and sometimes recalcitrant nature of the disease.      -Continues to see marked improvement with Humira which he is tolerating well, however he has recalcitrant draining sinuses on the right scrotum and right inguinal.   - Refilled Humira 40mg  [place] weekly  - Recheck Quant Gold    -prescribed another course of metronidazole mono therapy for abx for at least one month, as he is tolerating this well and sees continued improvement.      Hx well controlled HIV; ID physician Dr. Lilli Prince aware and agrees with Humira    RTC: 35mo w/ Dr Dustin Prince    SUBJECTIVE:    CC: Hidradenitis Suppurativa    Dustin Prince is a 58 y.o. male last seen by Dustin Prince in 09/2017 here for f/u HS in groin. Currently on Humira 40mg  [place] weekly which helps greatly. However, since finishing course of metronidazole, he has started flaring again with some drainage and sores on the scrotum.     History of well-controlled HIV antiretroviral on ART.  See Dr. Ninetta Prince in [place].      Social History:  Current or former smoker? never  Amount smoking: n/a  How many years: n/a  ED visits in the last 5 years? 6-10  Difficulty affording medications? most of the time  Marital Status: in relationship  Living with some one? Yes.    Prior treatments:  Topical: topical ABX Systemic: clidnamycin; doxy (not very helpful),   Past surgical procedures: large WLE left axilla c/b dehisence   Past laser procedures: none      ROS: the balance of 10 systems is negative unless otherwise documented      OBJECTIVE:   Gen: Well-appearing patient, appropriate, interactive, in no acute distress  Skin: Examination of the scalp, face, neck, chest, back, abdomen, bilateral upper and lower extremities, hands, palms, soles, nails, buttocks, and external genitalia performed today and pertinent for:     Few eroded draining nodules and scars on scrotum and inguinal creases  All other areas not specifically commented on are within normal limits

## 2018-05-05 NOTE — Unmapped (Signed)
Patient Education        Hidradenitis Suppurativa: Care Instructions  Your Care Instructions    Hidradenitis suppurativa (say hih-drad-uh-NY-tus sup-yur-uh-TY-vuh) is a skin condition that causes lumps on the skin that look like pimples or boils. The lumps are usually painful and can break open and drain blood and bad-smelling pus. The condition can come and go for many years.  Treatment for this condition may include antibiotics and other medicines. You may need surgery to remove the lumps. Home care includes wearing loose-fitting clothes and washing the area gently. You can help prevent lumps from coming back by staying at a healthy weight and not smoking.  Doctors don't know exactly how this condition starts. But they do know that something irritates and inflames the hair follicles, causing them to swell and form lumps. This skin condition can't be spread from person to person (isn't contagious).  Follow-up care is a key part of your treatment and safety. Be sure to make and go to all appointments, and call your doctor if you are having problems. It's also a good idea to know your test results and keep a list of the medicines you take.  How can you care for yourself at home?  ??Skin care  ?? ?? Wash the area every day with mild soap. Use your hands rather than a washcloth or sponge when you wash that part of your body.   ?? ?? Leave the affected areas uncovered when you can. If you have lumps that are draining, you can cover them with a bandage or other dressing. Put petroleum jelly (such as Vaseline) on the dressing to help keep it from sticking.   ?? ?? Wear-loose fitting clothes that don't rub against the area. Avoid activities that cause skin to rub together.   ?? ?? If you have pain, try a warm compress. Soak a towel or washcloth in warm water, wring it out, and place it on the affected skin for about 10 minutes.   Medicines  ?? ?? Be safe with medicines. Take your medicines exactly as prescribed. Call your doctor if you think you are having a problem with your medicine. You will get more details on the specific medicines your doctor prescribes.   ?? ?? If your doctor prescribed antibiotics, take them as directed. Do not stop taking them just because you feel better. You need to take the full course of antibiotics.   ??Lifestyle choices  ?? ?? If you smoke, think about quitting. Smoking can make the condition worse. If you need help quitting, talk to your doctor about stop-smoking programs and medicines. These can increase your chances of quitting for good.   ?? ?? Stay at a healthy weight, or lose weight, by eating healthy foods and being physically active. Being overweight could make this condition worse.   When should you call for help?  Call your doctor now or seek immediate medical care if:  ?? ?? You have symptoms of infection, such as:  ? Increased pain, swelling, warmth, or redness.  ? Red streaks leading from the area.  ? Pus draining from the area.  ? A fever.   ??Watch closely for changes in your health, and be sure to contact your doctor if:  ?? ?? You do not get better as expected.   Where can you learn more?  Go to [facility] at https://myuncchart.org  [facility] under [employer]. Enter (364) 230-2301 in the search box to learn more about Hidradenitis Suppurativa: Care  Instructions.  Current as of: June 30, 2017  Content Version: 12.3  ?? 2006-2019 [employer]. Care instructions adapted under license by [facility]. If you have questions about a medical condition or this instruction, always ask your healthcare professional. Healthwise, Inc[employer] disclaims any warranty or liability for your use of this information.

## 2018-05-07 DIAGNOSIS — H40013 Open angle with borderline findings, low risk, bilateral: Secondary | ICD-10-CM | POA: Diagnosis not present

## 2018-05-07 DIAGNOSIS — Z7689 Persons encountering health services in other specified circumstances: Secondary | ICD-10-CM | POA: Diagnosis not present

## 2018-05-07 DIAGNOSIS — H40033 Anatomical narrow angle, bilateral: Secondary | ICD-10-CM | POA: Diagnosis not present

## 2018-05-08 LAB — QUANTIFERON TB GOLD PLUS
QUANTIFERON ANTIGEN 2 MINUS NIL: -0.03 [IU]/mL
QUANTIFERON MITOGEN: 9.97 [IU]/mL
QUANTIFERON TB NIL VALUE: 0.05 [IU]/mL

## 2018-05-08 LAB — QUANTIFERON MITOGEN: Lab: 9.97

## 2018-05-13 NOTE — Unmapped (Signed)
[facility] Refill Coordination Note    Specialty Medication(s) to be Shipped:   Inflammatory Disorders: Humira    Other medication(s) to be shipped: na     Dustin Prince, DOB: [birth year]  Phone: [phone] (home)       All above HIPAA information was verified with patient.     Completed refill call assessment today to schedule patient's medication shipment from the [facility] [phone]).       Specialty medication(s) and dose(s) confirmed: Regimen is correct and unchanged.   Changes to medications: Dustin Prince reports no changes reported at this time.  Changes to insurance: No  Questions for the pharmacist: No    Confirmed patient received Welcome Packet with first shipment. The patient will receive a drug information handout for each medication shipped and additional FDA Medication Guides as required.       DISEASE/MEDICATION-SPECIFIC INFORMATION        N/A    SPECIALTY MEDICATION ADHERENCE     Medication Adherence    Patient reported X missed doses in the last month:  0  Specialty Medication:  humira  Patient is on additional specialty medications:  No  Patient is on more than two specialty medications:  No  Any gaps in refill history greater than 2 weeks in the last 3 months:  no  Demonstrates understanding of importance of adherence:  yes  Informant:  patient  Reliability of informant:  reliable  Support network for adherence:  family member  Confirmed plan for next specialty medication refill:  delivery by pharmacy  Refills needed for supportive medications:  not needed          Refill Coordination    Has the Patients' Contact Information Changed:  No  Is the Shipping Address Different:  No           humira cf inj. Patient has 1 pen remaining that he will take this coming Tuesday      SHIPPING     Shipping address confirmed in Epic.     Delivery Scheduled: Yes, Expected medication delivery date: 022120.     Medication will be delivered via UPS to the home address in Epic WAM.    Dustin Prince   [facility] Specialty Technician

## 2018-05-14 ENCOUNTER — Ambulatory Visit (INDEPENDENT_AMBULATORY_CARE_PROVIDER_SITE_OTHER): Payer: Medicaid Other | Admitting: Neurology

## 2018-05-14 DIAGNOSIS — R0683 Snoring: Secondary | ICD-10-CM

## 2018-05-14 DIAGNOSIS — R600 Localized edema: Secondary | ICD-10-CM

## 2018-05-14 DIAGNOSIS — R0681 Apnea, not elsewhere classified: Secondary | ICD-10-CM

## 2018-05-14 DIAGNOSIS — R519 Headache, unspecified: Secondary | ICD-10-CM

## 2018-05-14 DIAGNOSIS — G4752 REM sleep behavior disorder: Secondary | ICD-10-CM

## 2018-05-14 DIAGNOSIS — R51 Headache: Secondary | ICD-10-CM

## 2018-05-14 DIAGNOSIS — G478 Other sleep disorders: Secondary | ICD-10-CM

## 2018-05-14 DIAGNOSIS — Z6841 Body Mass Index (BMI) 40.0 and over, adult: Secondary | ICD-10-CM

## 2018-05-14 DIAGNOSIS — G4733 Obstructive sleep apnea (adult) (pediatric): Secondary | ICD-10-CM | POA: Diagnosis not present

## 2018-05-14 DIAGNOSIS — G4720 Circadian rhythm sleep disorder, unspecified type: Secondary | ICD-10-CM

## 2018-05-14 DIAGNOSIS — R351 Nocturia: Secondary | ICD-10-CM

## 2018-05-14 DIAGNOSIS — F515 Nightmare disorder: Secondary | ICD-10-CM

## 2018-05-15 NOTE — Progress Notes (Signed)
Patient referred by Dr. Heber [place], seen by me on 04/23/18, diagnostic PSG on 05/14/18.   Please call and notify the patient that the recent sleep study did not show any significant obstructive sleep apnea. He did have sleep talking and was noted to move and talk during dream sleep. This is in keeping with his history of dream enactment behavior at home. Please inform patient that I would like to go over the details of the study during a follow up appointment. We can talk about potential symptomatic Rx options at the time. Pls arrange a followup appointment.   Thanks,  [name], MD, PhD [facility])

## 2018-05-15 NOTE — Procedures (Signed)
PATIENT'S NAME:  Allen Espinoza, Allen Espinoza DOB:      [birth year]      MR#:    [id number]     DATE OF RECORDING: 05/14/2018 REFERRING M.D.:  Kalman Shan, DO Study Performed:   Baseline Polysomnogram HISTORY: 58 year old man with an underlying complex medical history of arthritis, avascular necrosis of the left femoral head, cholelithiasis, chronic kidney disease, diverticulosis, HIV, history of DVT, and morbid obesity with BMI of over 40, who reports snoring and excessive daytime somnolence as well vivid dreams and dream enactment behaviors. The patient endorsed the Epworth Sleepiness Scale at 5 points. The patient's weight 321 pounds with a height of 72 (inches), resulting in a BMI of 43.6 kg/m2. The patient's neck circumference measured 17.2 inches.  CURRENT MEDICATIONS: Adalimumab, Proventil, Ventolin, Biktarvy, Lasix, Xarelto,   PROCEDURE:  This is a multichannel digital polysomnogram utilizing the Somnostar 11.2 system.  Electrodes and sensors were applied and monitored per AASM Specifications.   EEG, EOG, Chin and Limb EMG, were sampled at 200 Hz.  ECG, Snore and Nasal Pressure, Thermal Airflow, Respiratory Effort, CPAP Flow and Pressure, Oximetry was sampled at 50 Hz. Digital video and audio were recorded.      BASELINE STUDY  Lights Out was at 21:01 and Lights On at 05:05.  Total recording time (TRT) was 484.5 minutes, with a total sleep time (TST) of 390 minutes.   The patient's sleep latency was 54.5 minutes, which is delayed. REM latency was 184.5 minutes, which is delayed. The sleep efficiency was 80.5%.     SLEEP ARCHITECTURE: WASO (Wake after sleep onset) was 40 minutes with mild to moderate sleep fragmentation noted. There were 26 minutes in Stage N1, 225.5 minutes Stage N2, 82.5 minutes Stage N3 and 56 minutes in Stage REM.  The percentage of Stage N1 was 6.7%, Stage N2 was 57.8%, Stage N3 was 21.2% and Stage R (REM sleep) was 14.4%, which is mildly reduced. The arousals were noted as: 66 were  spontaneous, 0 were associated with PLMs, 0 were associated with respiratory events.  RESPIRATORY ANALYSIS:  There were a total of 0 respiratory events:  0 obstructive apneas, 0 central apneas and 0 mixed apneas with a total of 0 apneas and an apnea index (AI) of 0 /hour. There were 0 hypopneas with a hypopnea index of 0 /hour. The patient also had 0 respiratory event related arousals (RERAs).      The total APNEA/HYPOPNEA INDEX (AHI) was 0 /hour and the total RESPIRATORY DISTURBANCE INDEX was 0 /hour.  0 events occurred in REM sleep and 0 events in NREM. The REM AHI was 0 /hour, versus a non-REM AHI of 0. The patient spent 54 minutes of total sleep time in the supine position and 336 minutes in non-supine.. The supine AHI was 0.0 versus a non-supine AHI of 0.0.  OXYGEN SATURATION & C02:  The Wake baseline 02 saturation was 97%, with the lowest being 90%. Time spent below 89% saturation equaled 0 minutes.  PERIODIC LIMB MOVEMENTS: The patient had a total of 0 Periodic Limb Movements.  The Periodic Limb Movement (PLM) index was 0 and the PLM Arousal index was 0/hour.  EMG and tech note analysis demonstrated movements during REM sleep and vocalizations, the technologist also noted sleep talking during NREM sleep. The EMG tone was intermittently elevated during REM sleep. The patient took 1 bathroom break. Mild intermittent snoring was noted. The EKG was in keeping with normal sinus rhythm (NSR).  Post-study, the patient indicated that sleep was the  same as usual.   IMPRESSION: 1. Primary snoring 2. REM sleep behavior disorder 3. Dysfunctions associated with sleep stages or arousal from sleep  RECOMMENDATIONS: 1. This study does not demonstrate any significant obstructive or central sleep disordered breathing.  2. REM sleep related vocalizations and increased muscle tone on EMG was noted as well as movements, no major limb or body movement, no intelligible words, no climbing or near fall. 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 This condition may result in sleep disruption, and therefore daytime symptoms. It may result in self injury but also injury to the bed partner. Making the sleep environment safe for the patient and bed partner is important. The exact cause of this condition is not known. RBD is at times linked to other neurological conditions, including neuro degenerative diseases such as Parkinson's disease (PD). It is not fully understood how RBD and Parkinson's disease are related and why RBD may occur at times many years before Parkinson's disease develops in some cases. Having RBD certainly does not mean the patient will go on to develop Parkinson's disease. Nevertheless, there may be up to 40% correlation between RBD and PD. RBD can be seen in conjunction with certain antidepressants.  3. This study shows sleep fragmentation and abnormal sleep stage percentages; these are nonspecific findings and per se do not signify an intrinsic sleep disorder or a cause for the patient's sleep-related symptoms. Causes include (but are not limited to) the first night effect of the sleep study, circadian rhythm disturbances, medication effect or an underlying mood disorder or medical problem.  4. The patient should be cautioned not to drive, work at heights, or operate dangerous or heavy equipment when tired or sleepy. Review and reiteration of good sleep hygiene measures should be pursued with any patient. 5. The patient will be seen in follow-up in the sleep clinic at [facility] for discussion of the test results, symptom and treatment compliance review, further management strategies, etc. The referring provider will be notified of the test results.  I certify that I have reviewed the entire raw data recording prior to the issuance of this report in accordance with the Standards of Accreditation of the American Academy of Sleep Medicine  (AASM)   [name], MD, PhD Diplomat, American Board of Neurology and Sleep Medicine (Neurology and Sleep Medicine)

## 2018-05-18 ENCOUNTER — Telehealth: Payer: Self-pay

## 2018-05-18 NOTE — Telephone Encounter (Signed)
I called pt and discussed his sleep study results with him. Pt is agreeable to an appt with Dr. Rexene Alberts to discuss the results in greater detail and potential tx options. An appt was made on 06/11/2018 at 10:30am with the pt. Pt verbalized understanding of results. Pt had no questions at this time but was encouraged to call back if questions arise.

## 2018-05-18 NOTE — Telephone Encounter (Signed)
-----   Message from [name], MD sent at 05/15/2018  9:21 AM EST ----- Patient referred by Dr. Heber [place], seen by me on 04/23/18, diagnostic PSG on 05/14/18.   Please call and notify the patient that the recent sleep study did not show any significant obstructive sleep apnea. He did have sleep talking and was noted to move and talk during dream sleep. This is in keeping with his history of dream enactment behavior at home. Please inform patient that I would like to go over the details of the study during a follow up appointment. We can talk about potential symptomatic Rx options at the time. Pls arrange a followup appointment.   Thanks,  [name], MD, PhD [facility])

## 2018-05-20 MED FILL — HUMIRA PEN CITRATE FREE 40 MG/0.4 ML: 28 days supply | Qty: 4 | Fill #0 | Status: AC

## 2018-05-20 NOTE — Unmapped (Signed)
The patient is expecting Humira to be delivered on 05/21/18.

## 2018-05-22 ENCOUNTER — Other Ambulatory Visit: Payer: Self-pay

## 2018-05-22 ENCOUNTER — Ambulatory Visit: Payer: Medicaid Other | Admitting: Internal Medicine

## 2018-05-22 DIAGNOSIS — B20 Human immunodeficiency virus [HIV] disease: Secondary | ICD-10-CM | POA: Diagnosis not present

## 2018-05-22 DIAGNOSIS — N183 Chronic kidney disease, stage 3 unspecified: Secondary | ICD-10-CM

## 2018-05-22 DIAGNOSIS — I825Z2 Chronic embolism and thrombosis of unspecified deep veins of left distal lower extremity: Secondary | ICD-10-CM | POA: Diagnosis not present

## 2018-05-22 DIAGNOSIS — R030 Elevated blood-pressure reading, without diagnosis of hypertension: Secondary | ICD-10-CM | POA: Diagnosis not present

## 2018-05-22 DIAGNOSIS — L732 Hidradenitis suppurativa: Secondary | ICD-10-CM

## 2018-05-22 DIAGNOSIS — Z7689 Persons encountering health services in other specified circumstances: Secondary | ICD-10-CM | POA: Diagnosis not present

## 2018-05-22 LAB — GLUCOSE, CAPILLARY: Glucose-Capillary: 106 mg/dL — ABNORMAL HIGH (ref 70–99)

## 2018-05-22 LAB — POCT GLYCOSYLATED HEMOGLOBIN (HGB A1C): Hemoglobin A1C: 5 % (ref 4.0–5.6)

## 2018-05-22 NOTE — Assessment & Plan Note (Addendum)
Allen Espinoza presents for obesity follow up. His weight is 15 lbs up since last seen in 10/2017. He met with Butch Penny for nutrition counseling about 6 months ago but was unable to follow up. He also enrolled in a gym which he attends 1-2 days a week. However, he has find it difficult to change his nutrition and find time to go to the gym and states "I just mostly make excuses to skip it." He would like to meet with Butch Penny again.  - Counseled on lifestyle modifications  - Follow up with Butch Penny, follow up with me in 3 months  - May be a candidate for bariatric surgery in the future

## 2018-05-22 NOTE — Patient Instructions (Addendum)
Ms. Dimond,   It will be very important that you work on weight loss.  Remember to start with small steps and build up from there.  Please follow-up with Butch Penny in 6 weeks and with me in 3 months.  If you have any questions or concerns in the meantime feel free to call us.  We will call you the results of your A1c are not normal.  -Dr. Frederico Hamman

## 2018-05-22 NOTE — Progress Notes (Signed)
   CC: Obesity follow up   HPI:  [name] is a 58 y.o. year-old male with PMH listed below who presents to clinic for obesity follow up. Please see problem based assessment and plan for further details.   Past Medical History:  Diagnosis Date  . Abscess    Between legs  . Arthritis    all over  . Avascular necrosis of femoral head, left ([place])   . Cholelithiasis   . CKD (chronic kidney disease) stage 3, GFR 30-59 ml/min (HCC)    sees kidney Dr.  . Diverticulosis   . DVT (deep venous thrombosis) (H[facility])    legs  . Dyspnea    when walking  . Dysrhythmia    remembers mother taking about having an irregular rhythm years when he was a child   . GERD (gastroesophageal reflux disease)   . HIV (human immunodeficiency virus infection) ([place])   . Morbid obesity ([place])    Review of Systems:   Review of Systems  Constitutional: Negative for chills, fever and malaise/fatigue.  Respiratory: Positive for shortness of breath. Negative for cough.   Cardiovascular: Positive for leg swelling. Negative for chest pain and palpitations.  Neurological: Negative for dizziness and headaches.    Physical Exam: Vitals:   05/22/18 1314  BP: 137/65  Pulse: (!) 53  Temp: 98.2 F (36.8 C)  TempSrc: Oral  Weight: (!) 335 lb 8 oz (152.2 kg)    General: Well-appearing male in no acute distress Cardiac: regular rate and rhythm, nl S1/S2, no murmurs, rubs or gallops  Pulm: CTAB, no wheezes or crackles, no increased work of breathing on room air  Ext: warm and well perfused, 2+ pitting edema bilaterally    Assessment & Plan:   See Encounters Tab for problem based charting.  Patient discussed with Dr. Evette Doffing

## 2018-05-25 ENCOUNTER — Encounter: Payer: Self-pay | Admitting: Internal Medicine

## 2018-05-25 NOTE — Assessment & Plan Note (Signed)
Switched to [employer] with undetectable viral load and improvement in CD4 to 360.

## 2018-05-25 NOTE — Assessment & Plan Note (Signed)
Compliant with Xarelto. No new LE swelling or pain. Denies hematochezia and melena.

## 2018-05-25 NOTE — Assessment & Plan Note (Signed)
Started on Adalimumab by Dr. Otho Ket at [facility] with no HS flares since then. Will continue to monitor.

## 2018-05-25 NOTE — Assessment & Plan Note (Signed)
BP at goal today. Not on antihypertensives. Encouraged weight loss (see obesity assessment and plan for further details). Will continue to monitor given ongoing weight gain and history of CKD.

## 2018-05-25 NOTE — Assessment & Plan Note (Signed)
Patient has a history of CKD Stage III. Remains stable. Follows up with Dr. Marval Regal.

## 2018-05-25 NOTE — Addendum Note (Signed)
Addended by: Lalla Brothers T on: 05/25/2018 01:28 PM   Modules accepted: Level of Service

## 2018-05-25 NOTE — Progress Notes (Signed)
Internal Medicine Clinic Attending  Case discussed with Dr. Santos-Sanchez at the time of the visit.  We reviewed the resident's history and exam and pertinent patient test results.  I agree with the assessment, diagnosis, and plan of care documented in the resident's note.    

## 2018-06-08 DIAGNOSIS — Z7689 Persons encountering health services in other specified circumstances: Secondary | ICD-10-CM | POA: Diagnosis not present

## 2018-06-08 DIAGNOSIS — H40031 Anatomical narrow angle, right eye: Secondary | ICD-10-CM | POA: Diagnosis not present

## 2018-06-11 ENCOUNTER — Ambulatory Visit: Payer: Self-pay | Admitting: Neurology

## 2018-06-11 NOTE — Unmapped (Signed)
[facility] Refill Coordination Note    Specialty Medication(s) to be Shipped:   Inflammatory Disorders: Humira    Other medication(s) to be shipped: na     Dustin Prince, DOB: 08-09-1960  Phone: [phone] (home)       All above HIPAA information was verified with patient.     Completed refill call assessment today to schedule patient's medication shipment from the [facility] [phone]).       Specialty medication(s) and dose(s) confirmed: Regimen is correct and unchanged.   Changes to medications: [name] reports no changes reported at this time.  Changes to insurance: No  Questions for the pharmacist: No    Confirmed patient received Welcome Packet with first shipment. The patient will receive a drug information handout for each medication shipped and additional FDA Medication Guides as required.       DISEASE/MEDICATION-SPECIFIC INFORMATION        N/A    SPECIALTY MEDICATION ADHERENCE     Medication Adherence    Patient reported X missed doses in the last month:  0  Specialty Medication:  humira  Patient is on additional specialty medications:  No  Patient is on more than two specialty medications:  No  Any gaps in refill history greater than 2 weeks in the last 3 months:  no  Demonstrates understanding of importance of adherence:  yes  Informant:  patient  Reliability of informant:  reliable  Support network for adherence:  family member  Confirmed plan for next specialty medication refill:  delivery by pharmacy  Refills needed for supportive medications:  not needed          Refill Coordination    Has the Patients' Contact Information Changed:  No  Is the Shipping Address Different:  No           humira cf pen 40mg /0.55ml injection. Patient has 2 pens remaining      SHIPPING     Shipping address confirmed in Epic.     Delivery Scheduled: Yes, Expected medication delivery date: 031820.     Medication will be delivered via UPS to the home address in Epic WAM.    Dustin Prince Dustin Prince   [facility] Specialty Technician

## 2018-06-15 ENCOUNTER — Telehealth: Payer: Self-pay

## 2018-06-15 ENCOUNTER — Ambulatory Visit: Payer: Self-pay | Admitting: Neurology

## 2018-06-15 NOTE — Telephone Encounter (Signed)
I called pt, offered him an appt tomorrow. Pt is agreeable tomorrow to an appt at 8:30am, check in at 8:00am. Pt verbalized understanding of new appt date and time.

## 2018-06-15 NOTE — Telephone Encounter (Signed)
I called pt yesterday and advised him that [facility] will be closed today. We will call him back to r/s. Pt verbalized understanding.

## 2018-06-16 ENCOUNTER — Ambulatory Visit: Payer: Medicaid Other | Admitting: Neurology

## 2018-06-16 ENCOUNTER — Telehealth: Payer: Self-pay | Admitting: Neurology

## 2018-06-16 ENCOUNTER — Encounter: Payer: Self-pay | Admitting: Neurology

## 2018-06-16 ENCOUNTER — Other Ambulatory Visit: Payer: Self-pay

## 2018-06-16 VITALS — BP 168/82 | HR 49 | Temp 98.2°F | Ht 72.0 in | Wt 339.0 lb

## 2018-06-16 DIAGNOSIS — G4752 REM sleep behavior disorder: Secondary | ICD-10-CM

## 2018-06-16 DIAGNOSIS — B20 Human immunodeficiency virus [HIV] disease: Secondary | ICD-10-CM | POA: Diagnosis not present

## 2018-06-16 DIAGNOSIS — R2689 Other abnormalities of gait and mobility: Secondary | ICD-10-CM | POA: Diagnosis not present

## 2018-06-16 DIAGNOSIS — M542 Cervicalgia: Secondary | ICD-10-CM | POA: Diagnosis not present

## 2018-06-16 DIAGNOSIS — L732 Hidradenitis suppurativa: Principal | ICD-10-CM

## 2018-06-16 NOTE — Patient Instructions (Signed)
You may have a condition called REM behavior disorder (RBD). This means, that you have a tendency to act out your dreams in your sleep. The frequency of this problem is highly variable and may range from night to night episodes to going days and weeks without any problems. This condition may result in sleep disruption, and therefore daytime symptoms such as lack of energy, problems focusing and concentrating and daytime sleepiness. It may result in self injury if you flail your arms and legs and even roll out of bed but also injury to your bed partner, if you accidentally grab, or punch or hit your bed partner. Making your sleep environment safe for you and your bed partner is important. We do not actually know what causes this condition exactly. It can be at times linked to taking a certain medication (such as an antidepressant), or an underlying medical condition or stressor, or an underlying neuro-degenerative disease such as Parkinson's disease (PD). We do not understand fully how RBD and Parkinson's disease are related and why RBD may occur at times many years before Parkinson's disease symptoms develop in some cases. Having RBD certainly does not mean you will go on to develop Parkinson's disease. Nevertheless, there may be up to 40% correlation between RBD and PD. We will monitor your exam and progress. Thankfully, your neurological exam is unremarkable.   We will do a brain scan, called MRI and call you with the test results. We will have to schedule you for this on a separate date. This test requires authorization from your insurance, and we will take care of the insurance process.  Will will recheck your kidney function in preparation of your scan.   You can try Melatonin at night for your sleep disorder: take 3 mg, one to 2 hours before your bedtime. You can go up to 5 mg if needed, at the most, 10 mg. It is over the counter and comes in pill form, chewable form and spray, if you prefer.     Reassuringly, your sleep study did not show any sleep apnea.
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
## 2018-06-16 NOTE — Telephone Encounter (Signed)
Medicaid order sent to GI. They will obtain the auth and reach out to the pt to schedule.  °

## 2018-06-16 NOTE — Unmapped (Signed)
Dustin Prince 's HUMIRA shipment will be delayed due to Prior Authorization Required We have contacted the patient and communicated the delivery change to patient/caregiver We will call the patient to reschedule the delivery upon resolution. We have confirmed the delivery date as NOT APPLICABLE .

## 2018-06-17 ENCOUNTER — Telehealth: Payer: Self-pay

## 2018-06-17 LAB — COMPREHENSIVE METABOLIC PANEL
A/G RATIO: 1.2 (ref 1.2–2.2)
ALT: 14 IU/L (ref 0–44)
AST: 13 IU/L (ref 0–40)
Albumin: 4.1 g/dL (ref 3.8–4.9)
Alkaline Phosphatase: 83 IU/L (ref 39–117)
BUN/Creatinine Ratio: 14 (ref 9–20)
BUN: 20 mg/dL (ref 6–24)
Bilirubin Total: 0.4 mg/dL (ref 0.0–1.2)
CO2: 23 mmol/L (ref 20–29)
Calcium: 9 mg/dL (ref 8.7–10.2)
Chloride: 103 mmol/L (ref 96–106)
Creatinine, Ser: 1.47 mg/dL — ABNORMAL HIGH (ref 0.76–1.27)
GFR calc Af Amer: 60 mL/min/{1.73_m2} (ref >59)
GFR calc non Af Amer: 51 mL/min/{1.73_m2} — ABNORMAL LOW (ref >59)
GLOBULIN, TOTAL: 3.4 g/dL (ref 1.5–4.5)
Glucose: 96 mg/dL (ref 65–99)
Potassium: 4.4 mmol/L (ref 3.5–5.2)
Sodium: 142 mmol/L (ref 134–144)
Total Protein: 7.5 g/dL (ref 6.0–8.5)

## 2018-06-17 NOTE — Telephone Encounter (Signed)
-----   Message from [name], MD sent at 06/17/2018  7:35 AM EDT ----- CMP showed impaired kidney function in the mild range but not good enough I believe to proceed with a brain MRI with contrast, I will change the order to brain MRI without contrast to be on the safe side. Pls update patient, we have discussed this at our visit already. Allen Espinoza

## 2018-06-17 NOTE — Addendum Note (Signed)
Addended by: [name] on: 06/17/2018 07:37 AM   Modules accepted: Orders

## 2018-06-17 NOTE — Progress Notes (Signed)
CMP showed impaired kidney function in the mild range but not good enough I believe to proceed with a brain MRI with contrast, I will change the order to brain MRI without contrast to be on the safe side. Pls update patient, we have discussed this at our visit already. Michel Bickers

## 2018-06-17 NOTE — Telephone Encounter (Signed)
I called pt and discussed his lab work with him. Pt is agreeable to doing MRI without contrast. Pt verbalized understanding of results. Pt had no questions at this time but was encouraged to call back if questions arise.

## 2018-06-18 MED FILL — HUMIRA PEN CITRATE FREE 40 MG/0.4 ML: 28 days supply | Qty: 4 | Fill #1 | Status: AC

## 2018-06-18 MED FILL — HUMIRA PEN CITRATE FREE 40 MG/0.4 ML: 28 days supply | Qty: 4 | Fill #1

## 2018-06-18 NOTE — Unmapped (Signed)
Dustin Prince 's HUMIRA shipment will be delayed due to Prior Authorization Required NOW APPROVED. We have contacted the patient and communicated the delivery change to patient/caregiver We will reschedule the medication for the delivery date that the patient agreed upon. We have confirmed the delivery date as 06/19/18 .

## 2018-06-26 ENCOUNTER — Other Ambulatory Visit: Payer: Medicaid Other

## 2018-06-29 DIAGNOSIS — Z7689 Persons encountering health services in other specified circumstances: Secondary | ICD-10-CM | POA: Diagnosis not present

## 2018-06-29 DIAGNOSIS — H40032 Anatomical narrow angle, left eye: Secondary | ICD-10-CM | POA: Diagnosis not present

## 2018-07-03 ENCOUNTER — Encounter: Payer: Medicaid Other | Admitting: Dietician

## 2018-07-06 ENCOUNTER — Other Ambulatory Visit: Payer: Self-pay | Admitting: Internal Medicine

## 2018-07-08 NOTE — Unmapped (Signed)
[facility] Refill Coordination Note    Specialty Medication(s) to be Shipped:   Inflammatory Disorders: Humira    Other medication(s) to be shipped: na     Dustin Prince, DOB: Apr 03, 1960  Phone: [phone] (home)       All above HIPAA information was verified with patient.     Completed refill call assessment today to schedule patient's medication shipment from the [facility] [phone]).       Specialty medication(s) and dose(s) confirmed: Regimen is correct and unchanged.   Changes to medications: Dustin Prince reports no changes reported at this time.  Changes to insurance: No  Questions for the pharmacist: No    Confirmed patient received Welcome Packet with first shipment. The patient will receive a drug information handout for each medication shipped and additional FDA Medication Guides as required.       DISEASE/MEDICATION-SPECIFIC INFORMATION        N/A    SPECIALTY MEDICATION ADHERENCE     Medication Adherence    Patient reported X missed doses in the last month:  0  Specialty Medication:  humira  Patient is on additional specialty medications:  No  Patient is on more than two specialty medications:  No  Any gaps in refill history greater than 2 weeks in the last 3 months:  no  Demonstrates understanding of importance of adherence:  yes  Informant:  patient  Reliability of informant:  reliable  Support network for adherence:  family member  Confirmed plan for next specialty medication refill:  delivery by pharmacy  Refills needed for supportive medications:  not needed          Refill Coordination    Has the Patients' Contact Information Changed:  No  Is the Shipping Address Different:  No           Humira. Patient has 1 pen remaining       SHIPPING     Shipping address confirmed in Epic.     Delivery Scheduled: Yes, Expected medication delivery date: 042120.     Medication will be delivered via UPS to the home address in Epic WAM.    Dustin Prince   [facility] Specialty Technician

## 2018-07-13 DIAGNOSIS — H40033 Anatomical narrow angle, bilateral: Secondary | ICD-10-CM | POA: Diagnosis not present

## 2018-07-13 DIAGNOSIS — H40013 Open angle with borderline findings, low risk, bilateral: Secondary | ICD-10-CM | POA: Diagnosis not present

## 2018-07-13 NOTE — Telephone Encounter (Signed)
Please advise patient that his MRI was denied by his insurance. We will follow him clinically and reassess the need for MRI scanning in the near future. FU as scheduled.

## 2018-07-13 NOTE — Telephone Encounter (Signed)
I called pt and discussed this with him. He is agreeable to the recommendations and will follow up in June.

## 2018-07-13 NOTE — Telephone Encounter (Signed)
Anderson Malta with [facility] informed me that medicaid did not approve the MRI.   Denial Reason:   Based on [employer] Head Imaging Guidelines Section: HD 15.1 Movement Disorders, we cannot approve this request. Your records show that you have a condition of that affects your body movement. The reason this request cannot be approved is because:1. Imaging is supported when there are signs or symptoms atypical for Parkinson's disease. The clinical information provided does not describe these criteria and, therefore, the request is not indicated at this time.   There is an option to do a peer to peer the phone number is [phone] and the case number is [id number] .

## 2018-07-16 ENCOUNTER — Other Ambulatory Visit: Payer: Medicaid Other

## 2018-07-20 MED FILL — HUMIRA PEN CITRATE FREE 40 MG/0.4 ML: 28 days supply | Qty: 4 | Fill #2 | Status: AC

## 2018-07-20 MED FILL — HUMIRA PEN CITRATE FREE 40 MG/0.4 ML: 28 days supply | Qty: 4 | Fill #2

## 2018-08-10 NOTE — Unmapped (Signed)
[facility] Refill Coordination Note    Specialty Medication(s) to be Shipped:   Inflammatory Disorders: Humira    Other medication(s) to be shipped: na     Dustin Prince, DOB: 03/18/1961  Phone: [phone] (home)       All above HIPAA information was verified with patient.     Completed refill call assessment today to schedule patient's medication shipment from the [facility] [phone]).       Specialty medication(s) and dose(s) confirmed: Regimen is correct and unchanged.   Changes to medications: Dustin Prince reports no changes at this time.  Changes to insurance: No  Questions for the pharmacist: No    Confirmed patient received Welcome Packet with first shipment. The patient will receive a drug information handout for each medication shipped and additional FDA Medication Guides as required.       DISEASE/MEDICATION-SPECIFIC INFORMATION        N/A    SPECIALTY MEDICATION ADHERENCE     Medication Adherence    Patient reported X missed doses in the last month:  0  Specialty Medication:  Humira  Patient is on additional specialty medications:  No  Patient is on more than two specialty medications:  No  Any gaps in refill history greater than 2 weeks in the last 3 months:  no  Demonstrates understanding of importance of adherence:  yes  Informant:  patient  Reliability of informant:  reliable  Support network for adherence:  family member  Confirmed plan for next specialty medication refill:  delivery by pharmacy  Refills needed for supportive medications:  not needed          Refill Coordination    Has the Patients' Contact Information Changed:  No  Is the Shipping Address Different:  No           Humira. 1 pen remaining      SHIPPING     Shipping address confirmed in Epic.     Delivery Scheduled: Yes, Expected medication delivery date: 051920.     Medication will be delivered via UPS to the home address in Epic WAM.    Dustin Prince D Dustin Prince   [facility] Specialty Technician

## 2018-08-12 DIAGNOSIS — H40033 Anatomical narrow angle, bilateral: Secondary | ICD-10-CM | POA: Diagnosis not present

## 2018-08-12 DIAGNOSIS — H35013 Changes in retinal vascular appearance, bilateral: Secondary | ICD-10-CM | POA: Diagnosis not present

## 2018-08-12 DIAGNOSIS — H40013 Open angle with borderline findings, low risk, bilateral: Secondary | ICD-10-CM | POA: Diagnosis not present

## 2018-08-12 DIAGNOSIS — H25013 Cortical age-related cataract, bilateral: Secondary | ICD-10-CM | POA: Diagnosis not present

## 2018-08-13 ENCOUNTER — Other Ambulatory Visit: Payer: Self-pay | Admitting: Infectious Diseases

## 2018-08-13 DIAGNOSIS — B20 Human immunodeficiency virus [HIV] disease: Secondary | ICD-10-CM

## 2018-08-17 ENCOUNTER — Other Ambulatory Visit: Payer: Medicaid Other

## 2018-08-17 MED FILL — HUMIRA PEN CITRATE FREE 40 MG/0.4 ML: 28 days supply | Qty: 4 | Fill #3 | Status: AC

## 2018-08-17 MED FILL — HUMIRA PEN CITRATE FREE 40 MG/0.4 ML: 28 days supply | Qty: 4 | Fill #3

## 2018-08-18 ENCOUNTER — Other Ambulatory Visit: Payer: Medicaid Other

## 2018-08-18 ENCOUNTER — Other Ambulatory Visit: Payer: Self-pay

## 2018-08-18 DIAGNOSIS — B20 Human immunodeficiency virus [HIV] disease: Secondary | ICD-10-CM

## 2018-08-19 LAB — T-HELPER CELL (CD4) - (RCID CLINIC ONLY)
CD4 % Helper T Cell: 16 % — ABNORMAL LOW (ref 33–65)
CD4 T Cell Abs: 300 /uL — ABNORMAL LOW (ref 400–1790)

## 2018-08-21 ENCOUNTER — Encounter: Payer: Self-pay | Admitting: Internal Medicine

## 2018-08-21 ENCOUNTER — Other Ambulatory Visit: Payer: Self-pay

## 2018-08-21 ENCOUNTER — Ambulatory Visit (INDEPENDENT_AMBULATORY_CARE_PROVIDER_SITE_OTHER): Payer: Medicaid Other | Admitting: Internal Medicine

## 2018-08-21 DIAGNOSIS — B20 Human immunodeficiency virus [HIV] disease: Secondary | ICD-10-CM

## 2018-08-21 DIAGNOSIS — R079 Chest pain, unspecified: Secondary | ICD-10-CM | POA: Diagnosis not present

## 2018-08-21 DIAGNOSIS — I825Z2 Chronic embolism and thrombosis of unspecified deep veins of left distal lower extremity: Secondary | ICD-10-CM | POA: Diagnosis not present

## 2018-08-21 MED ORDER — PANTOPRAZOLE SODIUM 40 MG PO TBEC: 40.0000 mg | DELAYED_RELEASE_TABLET | Freq: Every day | ORAL | 1 refills | Status: DC

## 2018-08-21 NOTE — Progress Notes (Signed)
  [facility] Residency Telephone Encounter Continuity Care Appointment  HPI:   This telephone encounter was created for [name] on 08/21/2018 for the following purpose/cc: evaluation of chest pain.   Past Medical History:  Past Medical History:  Diagnosis Date  . Abscess    Between legs  . Arthritis    all over  . Avascular necrosis of femoral head, left ([place])   . Cholelithiasis   . CKD (chronic kidney disease) stage 3, GFR 30-59 ml/min (HCC)    sees kidney Dr.  . Diverticulosis   . DVT (deep venous thrombosis) (HCC)    legs  . Dyspnea    when walking  . Dysrhythmia    remembers mother taking about having an irregular rhythm years when he was a child   . GERD (gastroesophageal reflux disease)   . HIV (human immunodeficiency virus infection) ([place])   . Morbid obesity ([place])       ROS:      Assessment / Plan / Recommendations:   Please see A&P under problem oriented charting for assessment of the patient's acute and chronic medical conditions.   As always, pt is advised that if symptoms worsen or new symptoms arise, they should go to an urgent care facility or to to ER for further evaluation.   Consent and Medical Decision Making:   Patient discussed with Dr. Daryll Drown  This is a telephone encounter between [facility] and Suzannah Bettes Santos-Sanchez on 08/21/2018 for evaluation of chest pain. The visit was conducted with the patient located at home and Welford Roche at [facility]. The patient's identity was confirmed using their DOB and current address. The patient has consented to being evaluated through a telephone encounter and understands the associated risks (an examination cannot be done and the patient may need to come in for an appointment) / benefits (allows the patient to remain at home, decreasing exposure to coronavirus). I personally spent 8 minutes on medical discussion.

## 2018-08-21 NOTE — Assessment & Plan Note (Signed)
Compliant with [employer]. Follows up with [facility].

## 2018-08-21 NOTE — Assessment & Plan Note (Addendum)
Allen Espinoza complains of non-radiating chest pain in the middle of his chest that worsens after meals. This has been going on for a few months. I prescribed him omeprazole to treat GERD given association of symptoms with food, but he does not recall if he took it. The pain has not changed since I last saw him in 10/2017. I sent a prescription for Protonix 40 mg QD and instructed him to take it 30 minutes prior to breakfast. This medication does not interact with Biktarvy. I also discussed dietary modifications to help with symptoms including avoidance of spicy and acidic foods. Will follow up in-person in 4 weeks.

## 2018-08-21 NOTE — Assessment & Plan Note (Signed)
Compliant with Xarelto. Denies LE pain or worsening edema.

## 2018-08-22 LAB — LIPID PANEL
Cholesterol: 162 mg/dL (ref <200)
HDL: 60 mg/dL (ref >=40)
LDL Cholesterol (Calc): 81 mg/dL (calc)
Non-HDL Cholesterol (Calc): 102 mg/dL (calc) (ref <130)
Total CHOL/HDL Ratio: 2.7 (calc) (ref <5.0)
Triglycerides: 114 mg/dL (ref <150)

## 2018-08-22 LAB — HIV-1 RNA QUANT-NO REFLEX-BLD
HIV 1 RNA Quant: <20 copies/mL
HIV-1 RNA Quant, Log: <1.3 Log copies/mL

## 2018-08-22 LAB — RPR: RPR Ser Ql: NONREACTIVE

## 2018-08-25 NOTE — Progress Notes (Signed)
Internal Medicine Clinic Attending  Case discussed with Dr. Isac Sarna soon after the resident saw the patient.  We reviewed the resident's history, telephone conversation and pertinent patient test results.  I agree with the assessment, diagnosis, and plan of care documented in the resident's note.

## 2018-08-31 ENCOUNTER — Telehealth: Payer: Self-pay | Admitting: Infectious Diseases

## 2018-08-31 NOTE — Telephone Encounter (Signed)
COVID-19 Pre-Screening Questions: ° °Do you currently have a fever (>100 °F), chills or unexplained body aches? No  ° °Are you currently experiencing new cough, shortness of breath, sore throat, runny nose? No  °•  °Have you recently travelled outside the state of [place] in the last 14 days? No  °•  °Have you been in contact with someone that is currently pending confirmation of Covid19 testing or has been confirmed to have the Covid19 virus?  No  °

## 2018-09-01 ENCOUNTER — Encounter: Payer: Self-pay | Admitting: Infectious Diseases

## 2018-09-01 ENCOUNTER — Other Ambulatory Visit: Payer: Self-pay

## 2018-09-01 ENCOUNTER — Ambulatory Visit (INDEPENDENT_AMBULATORY_CARE_PROVIDER_SITE_OTHER): Payer: Medicaid Other | Admitting: Infectious Diseases

## 2018-09-01 VITALS — BP 167/84 | HR 58 | Temp 97.9°F | Wt 343.0 lb

## 2018-09-01 DIAGNOSIS — B20 Human immunodeficiency virus [HIV] disease: Secondary | ICD-10-CM

## 2018-09-01 DIAGNOSIS — Z113 Encounter for screening for infections with a predominantly sexual mode of transmission: Secondary | ICD-10-CM | POA: Diagnosis not present

## 2018-09-01 NOTE — Patient Instructions (Addendum)
Doing very well on your Biktarvy.  Your viral load remains undetectable which is the goal of taking your medication every day.  Please continue!  Your CD4 count (immune system) remains in a healthy range above 200.  I am very happy that this continues to be the case.   For weight loss -a lot of this starts with the mindset.  Getting your mind determined that you are going to do this and that you are going to not talk yourself out of it from a day by day basis or wait to the next day as the first step.  Some tips that I found personally helpful include:  Creating "food rules" you can live by -for example commit to not eating any snacks in the morning.  It is okay to have breakfast after 11:00 if this is better for you.  Do not eat if you are not hungry.  Stop eating after 6 PM.  Get familiar with understanding why you continue to want to eat.  Quiz yourself and ask yourself questions when you get up to go to the pantry if you are really hungry.  Are you just thirsty.  Are you tired.  Are you bored.  Is this a habit because is what you do on moments you watch TV.  Reduce your soda intake and other sugars and flours (breads, pasta, pastries, cookies, chips).   Focus on foods that grow above ground as well as meats/fish/nuts.   Try eating all of you meals on a small plate and fill half that plate with veggies first  Challenge yourself to stop eating halfway through a meal and see if you can wait 5 minutes and drink 8-12 oz of water before you finish.    For weight loss if you would like to be referred for a program to help look into the following web site for the [facility]  Inrails.tn  Please come back to see Colletta Maryland again in 6 months with labs prior to your appointment    Food Choices for Gastroesophageal Reflux Disease, Adult When you have gastroesophageal reflux disease (GERD), the foods you eat  and your eating habits are very important. Choosing the right foods can help ease your discomfort. Think about working with a nutrition specialist (dietitian) to help you make good choices. What are tips for following this plan?  Meals  Choose healthy foods that are low in fat, such as fruits, vegetables, whole grains, low-fat dairy products, and lean meat, fish, and poultry.  Eat small meals often instead of 3 large meals a day. Eat your meals slowly, and in a place where you are relaxed. Avoid bending over or lying down until 2-3 hours after eating.  Avoid eating meals 2-3 hours before bed.  Avoid drinking a lot of liquid with meals.  Cook foods using methods other than frying. Bake, grill, or broil food instead.  Avoid or limit: ? Chocolate. ? Peppermint or spearmint. ? Alcohol. ? Pepper. ? Black and decaffeinated coffee. ? Black and decaffeinated tea. ? Bubbly (carbonated) soft drinks. ? Caffeinated energy drinks and soft drinks.  Limit high-fat foods such as: ? Fatty meat or fried foods. ? Whole milk, cream, butter, or ice cream. ? Nuts and nut butters. ? Pastries, donuts, and sweets made with butter or shortening.  Avoid foods that cause symptoms. These foods may be different for everyone. Common foods that cause symptoms include: ? Tomatoes. ? Oranges, lemons, and limes. ? Peppers. ? Spicy food. ?  Onions and garlic. ? Vinegar. Lifestyle  Maintain a healthy weight. Ask your doctor what weight is healthy for you. If you need to lose weight, work with your doctor to do so safely.  Exercise for at least 30 minutes for 5 or more days each week, or as told by your doctor.  Wear loose-fitting clothes.  Do not smoke. If you need help quitting, ask your doctor.  Sleep with the head of your bed higher than your feet. Use a wedge under the mattress or blocks under the bed frame to raise the head of the bed. Summary  When you have gastroesophageal reflux disease (GERD),  food and lifestyle choices are very important in easing your symptoms.  Eat small meals often instead of 3 large meals a day. Eat your meals slowly, and in a place where you are relaxed.  Limit high-fat foods such as fatty meat or fried foods.  Avoid bending over or lying down until 2-3 hours after eating.  Avoid peppermint and spearmint, caffeine, alcohol, and chocolate. This information is not intended to replace advice given to you by your health care provider. Make sure you discuss any questions you have with your health care provider. Document Released: 09/17/2011 Document Revised: 04/23/2016 Document Reviewed: 04/23/2016 Elsevier Interactive Patient Education  2019 [employer].

## 2018-09-03 ENCOUNTER — Encounter: Payer: Self-pay | Admitting: Infectious Diseases

## 2018-09-03 NOTE — Assessment & Plan Note (Signed)
We discussed several resources he continues to help with behavioral changes and to develop resiliency with regards to mindful eating patterns.  We discussed referral to [facility] bariatric clinic -he will consider this.  He has had several first-degree relatives who have had cardiac disease with heart attack, he himself has high blood pressure and HIV which puts him at a fairly high risk himself.

## 2018-09-03 NOTE — Assessment & Plan Note (Addendum)
Very well controlled on his Biktarvy.  He will continue this 1 pill once a day.  Refills were provided today. Declines STI screening.  He has 1 male partner. He can follow-up in 6 months.  We will give him his flu shot at that appointment.

## 2018-09-03 NOTE — Progress Notes (Signed)
Name: Allen Espinoza  DOB: Aug 21, 1960 MRN: [id number] PCP: Welford Roche, MD    Patient Active Problem List   Diagnosis Date Noted  . Snoring 03/05/2018  . Encounter for preventive care 11/19/2017  . 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 ([place]) 08/19/2016  . Morbid (severe) obesity due to excess calories ([place]) 08/19/2016  . Solitary pulmonary nodule 05/06/2016  . Hidradenitis suppurativa 04/27/2016  . Chest pain 04/26/2016  . Avascular necrosis of bone of hip, left s/p TRH ([place]) 03/19/2016  . Human immunodeficiency virus (HIV) disease ([place]) 03/18/2016  . Chronic kidney disease 03/18/2016  . Cholelithiasis 03/18/2016  . Diverticulosis of colon without hemorrhage 03/18/2016     Brief Narrative:  Allen Espinoza is a 58 y.o. male with HIV infection. Originally diagnosed in 2007 in [place].  History of OIs: none.  HIV Risk: heterosexual   07/2016 Quantiferon GOLD (-)  Previous Regimens: Jorje Guild + Prezista >> suppressed (changed due to DDI with Xarelto) . Biktarvy >> suppressed   Genotypes: . No mutations   Subjective:  CC: Follow up on HIV care. No complaints. Wants to review labs.   HPI/ROS:  Allen Espinoza tells me he is doing very well.  No changes to his health during them.  Since her last visit.  He has had no significant periods of illness or hospitalizations/ ER visits.  He is doing well on his Biktarvy and has not missed a single dose.  He takes this every day the same time.  No new medications since her last office visit.  He feels like he is gained a significant amount of weight and really needs to work on getting this off.  He knows that his big problem is eating.  He snacks frequently and eats late at night.  He eats 3 full meals a day since "this is how he was raised."  He does not always feel hungry at breakfast but eats it every day.  He finds himself bored and snacks even as early as 30 minutes after full meal.  He knows  he needs to make a commitment with his mindset to try to help remedy this.  Estimates significant improvement in his flares of hidradenitis, now only gets them once every 3 months on the lower abdomen on adalimumab through [facility] dermatology.  Review of Systems  Constitutional: Negative for chills and fever.  HENT: Negative for tinnitus.   Eyes: Negative for blurred vision and photophobia.  Respiratory: Negative for cough and sputum production.   Cardiovascular: Negative for chest pain.  Gastrointestinal: Negative for diarrhea, nausea and vomiting.  Genitourinary: Negative for dysuria.  Skin: Negative for rash.  Neurological: Negative for headaches.    Past Medical History:  Diagnosis Date  . Abscess    Between legs  . Arthritis    all over  . Avascular necrosis of femoral head, left ([place])   . Cholelithiasis   . CKD (chronic kidney disease) stage 3, GFR 30-59 ml/min (HCC)    sees kidney Dr.  . Diverticulosis   . DVT (deep venous thrombosis) (HCC)    legs  . Dyspnea    when walking  . Dysrhythmia    remembers mother taking about having an irregular rhythm years when he was a child   . GERD (gastroesophageal reflux disease)   . HIV (human immunodeficiency virus infection) ([name])   . Morbid obesity ([place])     Outpatient Medications Prior to Visit  Medication Sig Dispense Refill  .  albuterol (PROVENTIL HFA;VENTOLIN HFA) 108 (90 Base) MCG/ACT inhaler Inhale 2 puffs into the lungs daily as needed for wheezing or shortness of breath.    Marland Kitchen BIKTARVY 50-200-25 MG TABS tablet Take 1 tablet by mouth once daily 240 tablet 0  . cyclobenzaprine (FLEXERIL) 5 MG tablet TAKE 1 TABLET BY MOUTH AT BEDTIME 30 tablet 1  . furosemide (LASIX) 20 MG tablet Take 20 mg by mouth daily.  6  . pantoprazole (PROTONIX) 40 MG tablet Take 1 tablet (40 mg total) by mouth daily. 30 tablet 1  . XARELTO 20 MG TABS tablet TAKE 1 TABLET BY MOUTH ONCE DAILY WITH SUPPER 90 tablet 3  . Adalimumab 40 MG/0.4ML PNKT  INJECT THE CONTENTS OF 1 PEN (40MG ) UNDER THE SKIN ONCE EACH WEEK    . dorzolamide (TRUSOPT) 2 % ophthalmic solution INSTILL ONE DROP INTO AFFECTED EYE TWICE A DAY STARTING 3 DAYS PRIOR TO TREATMENT    . prednisoLONE acetate (PRED FORTE) 1 % ophthalmic suspension INSTILL ONE DROP INTO AFFECTED EYE TWICE A DAY STARTING 3 DAYS PRIOR TO TREATMENT     No facility-administered medications prior to visit.      Allergies  Allergen Reactions  . Sulfa Antibiotics Other (See Comments)    Low blood pressure  . Vancomycin Itching    Give with benadryl    Social History   Tobacco Use  . Smoking status: Never Smoker  . Smokeless tobacco: Never Used  Substance Use Topics  . Alcohol use: No    Comment: prior  . Drug use: No    Comment: prior cocaine use, last 2005    Social History   Substance and Sexual Activity  Sexual Activity Yes  . Partners: Female  . Birth control/protection: Condom     Objective:   Vitals:   09/01/18 0858  BP: (!) 167/84  Pulse: (!) 58  Temp: 97.9 F (36.6 C)  TempSrc: Oral  Weight: (!) 343 lb (155.6 kg)   Body mass index is 46.52 kg/m.  Physical Exam Vitals signs reviewed.  Constitutional:      Appearance: He is well-developed.     Comments: Pleasant. Seated comfortable in chair. Appears well today. Wearing facemask.   HENT:     Mouth/Throat:     Mouth: No oral lesions.     Dentition: Normal dentition. No dental caries.     Pharynx: No oropharyngeal exudate.  Eyes:     General: No scleral icterus. Cardiovascular:     Rate and Rhythm: Normal rate and regular rhythm.     Heart sounds: Normal heart sounds.  Pulmonary:     Effort: Pulmonary effort is normal.     Breath sounds: Normal breath sounds.  Abdominal:     General: There is no distension.     Palpations: Abdomen is soft.     Tenderness: There is no abdominal tenderness.  Lymphadenopathy:     Cervical: No cervical adenopathy.  Skin:    General: Skin is warm and dry.     Findings:  No rash.  Neurological:     Mental Status: He is alert and oriented to person, place, and time.     Lab Results Lab Results  Component Value Date   WBC 6.5 02/04/2018   HGB 14.5 02/04/2018   HCT 43.0 02/04/2018   MCV 98.9 02/04/2018   PLT 209 02/04/2018    Lab Results  Component Value Date   CREATININE 1.47 (H) 06/16/2018   BUN 20 06/16/2018   NA 142 06/16/2018  K 4.4 06/16/2018   CL 103 06/16/2018   CO2 23 06/16/2018    Lab Results  Component Value Date   ALT 14 06/16/2018   AST 13 06/16/2018   ALKPHOS 83 06/16/2018   BILITOT 0.4 06/16/2018    Lab Results  Component Value Date   CHOL 162 08/18/2018   HDL 60 08/18/2018   LDLCALC 81 08/18/2018   TRIG 114 08/18/2018   CHOLHDL 2.7 08/18/2018   HIV 1 RNA Quant (copies/mL)  Date Value  08/18/2018 <20 NOT DETECTED  02/04/2018 <20 DETECTED (A)  05/01/2017 <20 NOT DETECTED   HIV-1 RNA Viral Load (copies/mL)  Date Value  03/18/2016 47,300   CD4 T Cell Abs (/uL)  Date Value  08/18/2018 300 (L)  02/04/2018 360 (L)  05/01/2017 190 (L)    Assessment & Plan:   Problem List Items Addressed This Visit      Unprioritized   Human immunodeficiency virus (HIV) disease ([place]) - Primary    Very well controlled on his Biktarvy.  He will continue this 1 pill once a day.  Refills were provided today. Declines STI screening.  He has 1 male partner. He can follow-up in 6 months.  We will give him his flu shot at that appointment.      Relevant Orders   T-helper cell (CD4)- ([facility] clinic only)   HIV-1 RNA quant-no reflex-bld   CBC   Comprehensive metabolic panel   Morbid (severe) obesity due to excess calories ([place])    We discussed several resources he continues to help with behavioral changes and to develop resiliency with regards to mindful eating patterns.  We discussed referral to [facility] bariatric clinic -he will consider this.  He has had several first-degree relatives who have had cardiac disease with heart  attack, he himself has high blood pressure and HIV which puts him at a fairly high risk himself.       Other Visit Diagnoses    Routine screening for STI (sexually transmitted infection)       Relevant Orders   Urine cytology ancillary only       Allen Madeira, MSN, NP-C [place] [facility] [place] Pager: [phone] Office: [phone]  09/03/18  2:45 PM

## 2018-09-04 MED ORDER — METRONIDAZOLE 250 MG TABLET
ORAL_TABLET | Freq: Three times a day (TID) | ORAL | 0 refills | 0.00000 days | Status: CP
Start: 2018-09-04 — End: 2018-09-28

## 2018-09-04 NOTE — Unmapped (Signed)
Dustin Prince reports doing well on his Humira. He would like to continue his metronidazole - I'll send a note to his team to see if they can send in refill for him.    [facility] Clinical Assessment & Refill Coordination Note    Dustin Prince, Dustin Prince: 04-25-1960  Phone: [phone] (home)     All above HIPAA information was verified with patient.     Specialty Medication(s):   Inflammatory Disorders: Humira     Current Outpatient Medications   Medication Sig Dispense Refill   ??? ADALIMUMAB PEN CITRATE FREE 40 MG/0.4 ML INJECT THE CONTENTS OF 1 PEN (40MG ) UNDER THE SKIN ONCE EACH WEEK 4 each 11   ??? albuterol HFA 90 mcg/actuation inhaler Inhale 2 puffs.     ??? bictegrav-emtricit-tenofov ala (BIKTARVY) 50-200-25 mg tablet Take 1 tablet by mouth daily.     ??? furosemide (LASIX) 20 MG tablet Take 20 mg by mouth daily.     ??? metroNIDAZOLE (FLAGYL) 250 MG tablet Take 1 tablet (250 mg total) by mouth Three (3) times a day. 90 tablet 2   ??? rivaroxaban (XARELTO) 20 mg tablet Take 20 mg by mouth daily with evening meal.       No current facility-administered medications for this visit.         Changes to medications: Atthew reports no changes at this time.    Allergies   Allergen Reactions   ??? Sulfasalazine      Other reaction(s): Other (See Comments)  Low blood pressure   ??? Vancomycin Itching     Give with benadryl       Changes to allergies: No    SPECIALTY MEDICATION ADHERENCE     Humira - 1 dose left    Medication Adherence    Support network for adherence:  family member          Specialty medication(s) dose(s) confirmed: Regimen is correct and unchanged.     Are there any concerns with adherence? No    Adherence counseling provided? Not needed    CLINICAL MANAGEMENT AND INTERVENTION      Clinical Benefit Assessment:    Do you feel the medicine is effective or helping your condition? Yes    Clinical Benefit counseling provided? Not needed    Adverse Effects Assessment:    Are you experiencing any side effects? No    Are you experiencing difficulty administering your medicine? No    Quality of Life Assessment:    How many days over the past month did your HS  keep you from your normal activities? For example, brushing your teeth or getting up in the morning. 0    Have you discussed this with your provider? Not needed    Therapy Appropriateness:    Is therapy appropriate? Yes, therapy is appropriate and should be continued    DISEASE/MEDICATION-SPECIFIC INFORMATION      For patients on injectable medications: Patient currently has 1 doses left.  Next injection is scheduled for Tues, June 9.    PATIENT SPECIFIC NEEDS     ? Does the patient have any physical, cognitive, or cultural barriers? No    ? Is the patient high risk? No     ? Does the patient require a Care Management Plan? No     ? Does the patient require physician intervention or other additional services (i.e. nutrition, smoking cessation, social work)? No      SHIPPING     Specialty Medication(s)  to be Shipped:   Inflammatory Disorders: Humira    Other medication(s) to be shipped: na     Changes to insurance: No    Delivery Scheduled: Yes, Expected medication delivery date: Wed, June 10.     Medication will be delivered via UPS to the confirmed home address in [facility].    The patient will receive a drug information handout for each medication shipped and additional FDA Medication Guides as required.  Verified that patient has previously received a Conservation officer, historic buildings.    All of the patient's questions and concerns have been addressed.    Lanney Gins   [facility] Specialty Pharmacist

## 2018-09-04 NOTE — Unmapped (Signed)
Addended by: Janece Canterbury on: 09/04/2018 02:19 PM     Modules accepted: Orders

## 2018-09-08 MED FILL — HUMIRA PEN CITRATE FREE 40 MG/0.4 ML: 28 days supply | Qty: 4 | Fill #4 | Status: AC

## 2018-09-08 MED FILL — HUMIRA PEN CITRATE FREE 40 MG/0.4 ML: 28 days supply | Qty: 4 | Fill #4

## 2018-09-10 ENCOUNTER — Telehealth: Payer: Self-pay | Admitting: Neurology

## 2018-09-10 NOTE — Telephone Encounter (Signed)
Due to current COVID 19 pandemic, our office is severely reducing in office visits until further notice, in order to minimize the risk to our patients and healthcare providers.   I called patient to offer a virtual visit for his 6/17 appt. Patient declined as he does not have the resources to participate this way. Patient will come in office by SCAT and understands the precautions that our office is taking for visits.

## 2018-09-16 ENCOUNTER — Other Ambulatory Visit: Payer: Self-pay

## 2018-09-16 ENCOUNTER — Ambulatory Visit (INDEPENDENT_AMBULATORY_CARE_PROVIDER_SITE_OTHER): Payer: Medicaid Other | Admitting: Neurology

## 2018-09-16 ENCOUNTER — Telehealth: Payer: Self-pay | Admitting: Neurology

## 2018-09-16 ENCOUNTER — Encounter: Payer: Self-pay | Admitting: Neurology

## 2018-09-16 VITALS — BP 174/85 | HR 54 | Temp 98.2°F | Ht 72.0 in | Wt 338.0 lb

## 2018-09-16 DIAGNOSIS — R51 Headache: Secondary | ICD-10-CM

## 2018-09-16 DIAGNOSIS — G4752 REM sleep behavior disorder: Secondary | ICD-10-CM

## 2018-09-16 DIAGNOSIS — B20 Human immunodeficiency virus [HIV] disease: Secondary | ICD-10-CM | POA: Diagnosis not present

## 2018-09-16 DIAGNOSIS — R2689 Other abnormalities of gait and mobility: Secondary | ICD-10-CM | POA: Diagnosis not present

## 2018-09-16 DIAGNOSIS — M79602 Pain in left arm: Secondary | ICD-10-CM | POA: Diagnosis not present

## 2018-09-16 DIAGNOSIS — Z7689 Persons encountering health services in other specified circumstances: Secondary | ICD-10-CM | POA: Diagnosis not present

## 2018-09-16 DIAGNOSIS — M542 Cervicalgia: Secondary | ICD-10-CM

## 2018-09-16 DIAGNOSIS — R519 Headache, unspecified: Secondary | ICD-10-CM

## 2018-09-16 MED ORDER — CLONAZEPAM 0.5 MG PO TABS: ORAL_TABLET | ORAL | 5 refills | Status: DC
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
## 2018-09-16 NOTE — Patient Instructions (Signed)
  I will resubmit an MRI request since you have had some recurrent headaches and also some discomfort in the left arm.  We will try to get you approved for a brain MRI without contrast. We will try for your Dream enactment behavior: Klonopin (generic: clonazepam): 0.5 mg, take 1/2 pill each bedtime for 2 weeks, then you can increase to 1 pill nightly thereafter. This is a Controlled substance.  Please do not lose the prescription.  It can be habit-forming but we will try you on a very low dose. Common side effects include sedation or sleepiness, balance problem, rare: personality changes, habit formation.

## 2018-09-16 NOTE — Telephone Encounter (Signed)
Medicaid order sent to GI. They will obtain the auth and reach out to the patient to schedule.  

## 2018-09-24 ENCOUNTER — Other Ambulatory Visit: Payer: Self-pay

## 2018-09-24 ENCOUNTER — Ambulatory Visit
Admission: RE | Admit: 2018-09-24 | Discharge: 2018-09-24 | Disposition: A | Discharge status: Home / Self Care | Payer: Medicaid Other | Source: Ambulatory Visit | Attending: Neurology | Admitting: Neurology

## 2018-09-24 DIAGNOSIS — B20 Human immunodeficiency virus [HIV] disease: Secondary | ICD-10-CM | POA: Diagnosis not present

## 2018-09-24 DIAGNOSIS — R519 Headache, unspecified: Secondary | ICD-10-CM

## 2018-09-24 DIAGNOSIS — R2689 Other abnormalities of gait and mobility: Secondary | ICD-10-CM

## 2018-09-24 DIAGNOSIS — M542 Cervicalgia: Secondary | ICD-10-CM | POA: Diagnosis not present

## 2018-09-24 DIAGNOSIS — G4752 REM sleep behavior disorder: Secondary | ICD-10-CM

## 2018-09-24 DIAGNOSIS — M79602 Pain in left arm: Secondary | ICD-10-CM

## 2018-09-25 NOTE — Progress Notes (Signed)
MRI brain wo contrast showed no acute findings and overall age-appropriate findings, some signs of hardening of the arteries type changes, but not unusual for age. Overall reassuring. As planned, he can FU in 3 months. Michel Bickers

## 2018-09-28 ENCOUNTER — Encounter: Admit: 2018-09-28 | Discharge: 2018-09-29 | Payer: BLUE CROSS/BLUE SHIELD

## 2018-09-28 ENCOUNTER — Telehealth: Payer: Self-pay

## 2018-09-28 DIAGNOSIS — B20 Human immunodeficiency virus [HIV] disease: Secondary | ICD-10-CM | POA: Diagnosis not present

## 2018-09-28 DIAGNOSIS — Z7689 Persons encountering health services in other specified circumstances: Secondary | ICD-10-CM | POA: Diagnosis not present

## 2018-09-28 DIAGNOSIS — L732 Hidradenitis suppurativa: Secondary | ICD-10-CM | POA: Diagnosis not present

## 2018-09-28 DIAGNOSIS — Z79899 Other long term (current) drug therapy: Secondary | ICD-10-CM | POA: Diagnosis not present

## 2018-09-28 MED ORDER — METRONIDAZOLE 250 MG TABLET
ORAL_TABLET | Freq: Three times a day (TID) | ORAL | 2 refills | 0.00000 days | Status: CP
Start: 2018-09-28 — End: 2018-11-30

## 2018-09-28 NOTE — Telephone Encounter (Signed)
I called pt to discuss his MRI results. Pt is unavailable and will call back when he is available.

## 2018-09-28 NOTE — Telephone Encounter (Signed)
I called pt, discussed his MRI results and recommendations. Pt verbalized understanding of results. Pt had no questions at this time but was encouraged to call back if questions arise.

## 2018-09-28 NOTE — Telephone Encounter (Signed)
Pt returned call. Please call when available. °

## 2018-09-28 NOTE — Telephone Encounter (Signed)
-----   Message from [name], MD sent at 09/25/2018  3:10 PM EDT ----- MRI brain wo contrast showed no acute findings and overall age-appropriate findings, some signs of hardening of the arteries type changes, but not unusual for age. Overall reassuring. As planned, he can FU in 3 months. Michel Bickers

## 2018-09-28 NOTE — Unmapped (Signed)
ASSESSMENT/PLAN:    Severe [place] 3 HS scrotal, inguinal and perirenal; improved on Humira, but flared with stopping metronidazole, in patient w/ hx of well controlled HIV  -We discussed the typical natural history, pathogenesis, treatment options, and expected course as well as the relapsing and sometimes recalcitrant nature of the disease.    -Continues to see marked improvement with Humira and metronidazole 250 mg TID which he is tolerating well  - Continue Humira 40mg  [name] weekly    - Continue metronidazole 250 mg TID. Counseled regarding risk of peripheral neuropathy with long-term use.  -ILK administered to inflammatory nodules of left groin as below  -Plan for HS unroofing of sinus in central pubic area. May consider unroofing of larger right scrotal/perineal area in future. Messaged Dustin Prince for scheduling.    Intralesional steroid injection procedure note:    Verbal informed consent was obtained prior to the procedure, after discussion of the risks (including pain, bleeding, infection, and lack of desired outcome), benefits (including improvement in skin condition), and alternatives (including no procedure). All questions were answered.  The inflammatory nodule (total number of lesions = 2), located on the left inguinal crease, was/were cleaned with alcohol and injected intralesionally with kenalog (triamcinolone 0.1% solution) 40 mg/cc x 0.4 cc total. The patient tolerated the procedure well without complications. Verbal post-care instructions were provided to the patient.    High Risk Medication Use  -Quant gold negative 05/05/2018    Hx well controlled HIV; ID physician Dr. Lilli Light aware and agrees with Humira    RTC: Pending unroofing scheduling    SUBJECTIVE:    CC: Hidradenitis Suppurativa    Dustin Prince is a 58 y.o. male last seen by Dr. Odis Luster in 05/2018 here for f/u HS in groin. Currently on Humira 40mg  [name] weekly which helps greatly as well as metronidazole 250 mg TID restarted 09/04/2018. He previously flared after stopping the metronidazole. Notes that he had one spot on his left pubic area which was painful and draining last week. He particularly bothered by the smell of these lesions. Overall, notes that his condition is much improved from prior since starting the Humira and restarting the metronidazole at the start of the month.    History of well-controlled HIV antiretroviral on ART.  Sees Dr. Ninetta Lights in [place].      Social History:  Current or former smoker? never  Amount smoking: n/a  How many years: n/a  ED visits in the last 5 years? 6-10  Difficulty affording medications? most of the time  Marital Status: in relationship  Living with some one? Yes.    Prior treatments:  Topical: topical ABX  Systemic: clidnamycin; doxy (not very helpful),   Past surgical procedures: large WLE left axilla c/b dehisence   Past laser procedures: none      ROS: the balance of 10 systems is negative unless otherwise documented      OBJECTIVE:   Gen: Well-appearing patient, appropriate, interactive, in no acute distress  Skin: Examination of the scalp, face, neck, chest, back, abdomen, bilateral upper and lower extremities, hands, palms, soles, nails, buttocks, and external genitalia performed today and pertinent for:   -Cribriform scarring of scrotum, inguinal creases, and pubic area. 1 cm non draining sinus of central pubic area. Larger non-draining sinus of right scrotum/perineum.  -one IN, 1 draining sinus of infraabdomen ~3cm  -L inguinal 1 IN, 1 abscess, 1 draining sinus 4-5cm  -perineum/scrotum with 7cm draining sinus  -All other areas not specifically commented on are within  normal limits    The patient was seen and examined by Leonette Nutting, MD who agrees with the assessment and plan as above.

## 2018-09-28 NOTE — Unmapped (Signed)
Hidradentis Suppurativa (pronounced ???high-drad-en-eye-tis/sup-your-uh-tee-vah???) is a chronic disease of hair follicles.  The lesions occur most commonly on areas of skin-to-skin contact: under the arms (axillary area), in the groin, around the buttocks, in the region around the anus and genitals, and on the skin between and under the breasts. In women, the underarms, groin, and breast areas are most commonly affected. Men most often have HS lesions around the anus and under the arms and may also have HS at the back of the neck and behind and around the ears.    What does HS look and feel like?   The first thing that someone with HS notices is a tender, raised, red bump that looks like an under-the-skin pimple or boil. Sometimes HS lesions have two or more ???heads.???  In mild disease only an occasional boil or abscess may occur, but in more active disease there can be many new lesions every month.  Some abscesses can become larger and may open and drain pus.  Bleeding and increased odor can also occur. In severe disease, deeper abscesses develop and may connect with each other under the skin to form tunnel-like tracts (sinuses, fistulas).  These may drain constantly, or may temporarily improve and then usually begin draining again over time.  In people who have had sinus tracts for some time, scars form that feel like ropes under the skin. In the very worst cases, networks of sinus tracts can form deeper in the body, including the muscle and other tissues. Many people with severe HS have scars that can limit their ability to freely move their arms or legs, though this is very unlikely for most patients.     Clinicians usually classify or ???grade??? HS using the Hurley staging system according to the severity of the disease for each body location:   Hurley stage I: one or more abscesses are present, but no sinus tracts have formed and no scars have developed   Hurley stage II: one or more abscesses are present that resolve and recur; on sinus tract can be present and scarring is seen   Hurley stage III: many abscesses and more than one sinus tract is present with extensive scars.    What causes HS?  The cause of HS is not completely understood.  It seems to be a disorder of hair follicles and often many family members are affected so genetics probably play a strong role.  Bacteria are often present and may make the disease worse, but infection does not seem to be the main cause. Hormones are also likely play a role since the condition typically starts around puberty when hair follicles under the arms and in the groin start to change.  It can sometimes flare with menstrual cycles in women as well.  In most cases it lasts for decades and starts to improve to some extent in the late 30s and 40s as long as many fistulas have not already formed.  Women are three times more likely than men to develop HS.    Other factors are known to contribute to HS flaring or becoming worse, though they are likely not the main causes. The factors most commonly associated with HS include:   Cigarette smoking - this is very highly linked.  Stopping smoking will likely not cure the disease, but likely is helpful in reducing how much and how often it flares.   Obesity - HS may occur even in people that are not overweight, but it is much more common   in patients that are.  There is some evidence that losing weight and eating a diet low in sugars and fats may be helpful in improving hidradenitis, though this is not helpful for everyone.  Working with a nutritionist may be an important way to help with this and is something your physician can help coordinate    Hidradenitis is not contagious.  It is not caused by a problem with personal hygiene or any other activity or behavior of those with the disease.    How can your doctor help you treat your hidradenitis?  Clinicians use both medication and surgery to treat HS. The choice of treatment--or combination of treatments--is made according to an individual patient???s needs. Clinicians consider several factors in determining the most appropriate plan for therapy:   Severity of disease - medications and some laser treatments are usually able to control disease best when fistulas are not present.  Fistulas typically require surgery.   Extent and location of disease   Chronicity (how often the lesions recur)    A number of different surgical methods have been developed that are useful for certain patients under particular circumstances. These can be done with local numbing and healing at home for some areas when disease is not too extensive with relatively brief recovery times.  In more extensive disease there may be a need for larger excisions under general anesthesia with healing time in the hospital and prolonged recovery periods for better disease control.      In addition, many medical treatments have been tried--some with more success than others. No medication is effective for all patients, and you and your doctor may have to try several different agents or combinations of agents before you find the treatment plan that works best for you.  The goals of therapy with medications that are either topical (used on the skin) or systemic (taken by mouth) are:  1. to clear the lesions or at least reduce their number and extent, and  2. to prevent new lesions from forming.  3. To reduce pain, drainage, and odor  Some of the types of medications commonly used are antibacterial skin washes and the topical antibiotics to prevent secondary infections and corticosteroid injections into the lesions to reduce inflammation.     Other medications that may be used include retinoids (similar to Accutane), drugs that effect how hormones and hair follicles interact, drugs that affect your immune system (such as methotrexate, adalimumab/Humira, and Remicaid/infliximab), steroids, and oral antibiotics.    Lasers that destroy hair follicles can also be helpful since they reduce the hair follicles that cause the problems.  Multiple treatments are typically required over time and there is some discomfort associated with treatment, but it is typically very fast and well-tolerated.    It is very important to realize that hidradenitis cannot be completely cured with any single medication or surgical procedure.  It is a disease that can be very stubborn and difficult to control, but with good treatment a lot of improvement and sometimes temporary remissions can be obtained. Poorly controlled disease can cause more fistulas to form and make managing the disease much more difficult over time so it is important to seek care to reduce major flares.  Surgery can provide a long term cure in some areas, though the disease can start again or continue in nearby areas.  A dermatologist is often the best person to help coordinate disease treatment, and sometimes other surgeons, pain specialists, other specialists, and nutritionists may be   part of the treatment team.    For More Information  Hidradenitis Suppurativa Foundation  http://www.hs-foundation.org/    American Academy of Dermatology  https://parker.com/    National Organization for Rare Disorders  https://rarediseases.org/rare-diseases/hidradenitis-suppurativa/

## 2018-09-28 NOTE — Unmapped (Signed)
I saw and evaluated the patient, participating in the key portions of the service.  I reviewed the resident’s note.  I agree with the resident’s findings and plan. Chaise Passarella J Amparo Donalson, MD

## 2018-09-29 NOTE — Unmapped (Signed)
[facility] Refill Coordination Note    Specialty Medication(s) to be Shipped:   Inflammatory Disorders: Humira    Other medication(s) to be shipped: na     Dustin Prince, DOB: 21-Oct-1960  Phone: [phone] (home)       All above HIPAA information was verified with patient.     Completed refill call assessment today to schedule patient's medication shipment from the [facility] [phone]).       Specialty medication(s) and dose(s) confirmed: Regimen is correct and unchanged.   Changes to medications: Astor reports no changes at this time.  Changes to insurance: No  Questions for the pharmacist: No    Confirmed patient received Welcome Packet with first shipment. The patient will receive a drug information handout for each medication shipped and additional FDA Medication Guides as required.       DISEASE/MEDICATION-SPECIFIC INFORMATION        N/A    SPECIALTY MEDICATION ADHERENCE     Medication Adherence    Patient reported X missed doses in the last month:  0  Specialty Medication:  humira cf 40 mg/0.4 ml  Patient is on additional specialty medications:  No  Patient is on more than two specialty medications:  No  Any gaps in refill history greater than 2 weeks in the last 3 months:  no  Demonstrates understanding of importance of adherence:  yes  Informant:  patient  Reliability of informant:  reliable  Support network for adherence:  family member  Confirmed plan for next specialty medication refill:  delivery by pharmacy  Refills needed for supportive medications:  not needed                humira cf 40 mg/0.4 ml. 1 pen remaining for 7/7 dose      SHIPPING     Shipping address confirmed in Epic.     Delivery Scheduled: Yes, Expected medication delivery date: 070820.     Medication will be delivered via UPS to the home address in Epic WAM.    Reggie Bise D Coraline Talwar   [facility] Specialty Technician

## 2018-10-06 MED FILL — HUMIRA PEN CITRATE FREE 40 MG/0.4 ML: 28 days supply | Qty: 4 | Fill #5

## 2018-10-06 MED FILL — HUMIRA PEN CITRATE FREE 40 MG/0.4 ML: 28 days supply | Qty: 4 | Fill #5 | Status: AC

## 2018-10-07 DIAGNOSIS — I129 Hypertensive chronic kidney disease with stage 1 through stage 4 chronic kidney disease, or unspecified chronic kidney disease: Secondary | ICD-10-CM | POA: Diagnosis not present

## 2018-10-07 DIAGNOSIS — I82409 Acute embolism and thrombosis of unspecified deep veins of unspecified lower extremity: Secondary | ICD-10-CM | POA: Diagnosis not present

## 2018-10-07 DIAGNOSIS — M87052 Idiopathic aseptic necrosis of left femur: Secondary | ICD-10-CM | POA: Diagnosis not present

## 2018-10-07 DIAGNOSIS — I519 Heart disease, unspecified: Secondary | ICD-10-CM | POA: Diagnosis not present

## 2018-10-07 DIAGNOSIS — B20 Human immunodeficiency virus [HIV] disease: Secondary | ICD-10-CM | POA: Diagnosis not present

## 2018-10-07 DIAGNOSIS — R0789 Other chest pain: Secondary | ICD-10-CM | POA: Diagnosis not present

## 2018-10-07 DIAGNOSIS — N183 Chronic kidney disease, stage 3 (moderate): Secondary | ICD-10-CM | POA: Diagnosis not present

## 2018-10-07 DIAGNOSIS — L732 Hidradenitis suppurativa: Secondary | ICD-10-CM | POA: Diagnosis not present

## 2018-10-07 DIAGNOSIS — Z7689 Persons encountering health services in other specified circumstances: Secondary | ICD-10-CM | POA: Diagnosis not present

## 2018-10-07 DIAGNOSIS — R748 Abnormal levels of other serum enzymes: Secondary | ICD-10-CM | POA: Diagnosis not present

## 2018-10-19 ENCOUNTER — Encounter: Payer: Self-pay | Admitting: Internal Medicine

## 2018-10-19 ENCOUNTER — Ambulatory Visit: Payer: Medicaid Other | Admitting: Internal Medicine

## 2018-10-19 ENCOUNTER — Other Ambulatory Visit: Payer: Self-pay

## 2018-10-19 VITALS — BP 141/62 | HR 55 | Temp 98.1°F | Ht 72.0 in | Wt 343.1 lb

## 2018-10-19 DIAGNOSIS — M25561 Pain in right knee: Secondary | ICD-10-CM

## 2018-10-19 DIAGNOSIS — Z7689 Persons encountering health services in other specified circumstances: Secondary | ICD-10-CM | POA: Diagnosis not present

## 2018-10-19 DIAGNOSIS — I129 Hypertensive chronic kidney disease with stage 1 through stage 4 chronic kidney disease, or unspecified chronic kidney disease: Secondary | ICD-10-CM | POA: Diagnosis not present

## 2018-10-19 DIAGNOSIS — G479 Sleep disorder, unspecified: Secondary | ICD-10-CM

## 2018-10-19 DIAGNOSIS — Z79899 Other long term (current) drug therapy: Secondary | ICD-10-CM | POA: Diagnosis not present

## 2018-10-19 DIAGNOSIS — N183 Chronic kidney disease, stage 3 unspecified: Secondary | ICD-10-CM

## 2018-10-19 DIAGNOSIS — M25562 Pain in left knee: Secondary | ICD-10-CM | POA: Diagnosis not present

## 2018-10-19 DIAGNOSIS — Z6841 Body Mass Index (BMI) 40.0 and over, adult: Secondary | ICD-10-CM | POA: Diagnosis not present

## 2018-10-19 DIAGNOSIS — I10 Essential (primary) hypertension: Secondary | ICD-10-CM | POA: Insufficient documentation

## 2018-10-19 DIAGNOSIS — L732 Hidradenitis suppurativa: Secondary | ICD-10-CM | POA: Diagnosis not present

## 2018-10-19 DIAGNOSIS — G8929 Other chronic pain: Secondary | ICD-10-CM | POA: Diagnosis not present

## 2018-10-19 DIAGNOSIS — M545 Low back pain: Secondary | ICD-10-CM | POA: Diagnosis not present

## 2018-10-19 NOTE — Progress Notes (Signed)
   CC: Follow up of HTN, CKD 3, HS, and obesity   HPI:  [name] is a 58 y.o. year-old male with PMH listed below who presents to clinic for follow up of HTN, CKD 3, HS, and obesity. Please see problem based assessment and plan for further details.   Past Medical History:  Diagnosis Date  . Abscess    Between legs  . Arthritis    all over  . Avascular necrosis of femoral head, left ([place])   . Cholelithiasis   . CKD (chronic kidney disease) stage 3, GFR 30-59 ml/min (HCC)    sees kidney Dr.  . Diverticulosis   . DVT (deep venous thrombosis) (HCC)    legs  . Dyspnea    when walking  . Dysrhythmia    remembers mother taking about having an irregular rhythm years when he was a child   . GERD (gastroesophageal reflux disease)   . HIV (human immunodeficiency virus infection) ([place])   . Morbid obesity ([place])     Review of Systems:   Review of Systems  Constitutional: Negative for chills, fever, malaise/fatigue and weight loss.  Respiratory: Negative for shortness of breath.   Cardiovascular: Negative for chest pain and leg swelling.  Gastrointestinal: Negative for abdominal pain, constipation, diarrhea, nausea and vomiting.  Neurological: Negative for dizziness.    Physical Exam:  Vitals:   10/19/18 1351  BP: (!) 141/62  Pulse: (!) 55  Temp: 98.1 F (36.7 C)  TempSrc: Oral  SpO2: 99%  Weight: (!) 343 lb 1.6 oz (155.6 kg)  Height: 6' (1.829 m)   General: Well-appearing male in no acute distress Cardiac: regular rate and rhythm, nl S1/S2, no murmurs, rubs or gallops Pulm: CTAB, no wheezes or crackles, no increased work of breathing on room air  Ext: warm and well perfused, trace peripheral edema   Assessment & Plan:   See Encounters Tab for problem based charting.  Patient discussed with Dr. Evette Doffing

## 2018-10-19 NOTE — Patient Instructions (Addendum)
Mr. Schutter,   Today we talked about weight loss. I gave you information for the gastric bypass surgery classes. You should continue working on diet and exercise. Please come back on 3 months for weight follow up. Call us if you have any questions or concerns.   - Dr. Frederico Hamman

## 2018-10-21 ENCOUNTER — Encounter: Payer: Self-pay | Admitting: Internal Medicine

## 2018-10-21 NOTE — Assessment & Plan Note (Signed)
Follows up at [facility] dermatology.  Currently doing well on Humira and Flagyl 3 times a week.  He is scheduled to have surgery in his right groin in 2 weeks.

## 2018-10-21 NOTE — Assessment & Plan Note (Signed)
Following up with neurology.  Was started on Klonopin 0.5 at bedtime which has been helping with vivid dreams.

## 2018-10-21 NOTE — Assessment & Plan Note (Signed)
Patient presents for follow-up of weight loss.  His weight remains stable in the 330s.  He states he has been trying to exercise and mobilized at home but his chronic lower back pain and bilateral knee pain limits his activity.  He met with Butch Penny for nutrition and weight loss education.  States he was advised to do smaller portions but he ends up eating several smaller portions.  We discussed bariatric surgery, which he qualifies for given his BMI. He does not know if he is interested in but is open to the idea. Provided information for orientation. He will schedule a class within the next month or so.

## 2018-10-21 NOTE — Assessment & Plan Note (Signed)
Patient has had multiple elevated blood pressures at different office visits.  He follows up with nephrology who started him on Lasix to manage his chronic peripheral edema as well as blood pressure.  BP is actually improved today, he is usually in the 160s to 170s.  We talked about adding a antihypertensive medication to his regimen that is not Lasix as this is only effective if dose 4 times a day, but he states he wanted to discuss it with his nephrologist.  He has an appointment coming up next week.

## 2018-10-21 NOTE — Assessment & Plan Note (Signed)
I reviewed his last note from nephrology.  Creatinine was stable without any electrolyte abnormalities.  He is scheduled to see his nephrologist Dr. Marval Regal next week.  He will discuss starting antihypertensive medication for his hypertension.

## 2018-10-22 NOTE — Progress Notes (Signed)
Internal Medicine Clinic Attending  Case discussed with Dr. Santos-Sanchez at the time of the visit.  We reviewed the resident's history and exam and pertinent patient test results.  I agree with the assessment, diagnosis, and plan of care documented in the resident's note.    

## 2018-10-28 MED FILL — HUMIRA PEN CITRATE FREE 40 MG/0.4 ML: 28 days supply | Qty: 4 | Fill #6

## 2018-10-28 MED FILL — HUMIRA PEN CITRATE FREE 40 MG/0.4 ML: 28 days supply | Qty: 4 | Fill #6 | Status: AC

## 2018-10-28 NOTE — Unmapped (Signed)
[facility] Refill Coordination Note    Specialty Medication(s) to be Shipped:   Inflammatory Disorders: Humira    Other medication(s) to be shipped: na     Dustin Prince, DOB: 25-Oct-1960  Phone: [phone] (home)       All above HIPAA information was verified with patient.     Completed refill call assessment today to schedule patient's medication shipment from the [facility] [phone]).       Specialty medication(s) and dose(s) confirmed: Regimen is correct and unchanged.   Changes to medications: Dustin Prince reports no changes at this time.  Changes to insurance: No  Questions for the pharmacist: No    Confirmed patient received Welcome Packet with first shipment. The patient will receive a drug information handout for each medication shipped and additional FDA Medication Guides as required.       DISEASE/MEDICATION-SPECIFIC INFORMATION        For patients on injectable medications: Patient currently has 0 doses left.  Next injection is scheduled for [id number].    SPECIALTY MEDICATION ADHERENCE     Medication Adherence    Patient reported X missed doses in the last month: 0  Specialty Medication: humira cf 40 mg/0.4 ml  Patient is on additional specialty medications: No  Patient is on more than two specialty medications: No  Any gaps in refill history greater than 2 weeks in the last 3 months: no  Demonstrates understanding of importance of adherence: yes  Informant: patient  Reliability of informant: reliable  Support network for adherence: family member  Confirmed plan for next specialty medication refill: delivery by pharmacy  Refills needed for supportive medications: not needed                humira cf 40 mg/0.4 ml. 0 on hand      SHIPPING     Shipping address confirmed in Epic.     Delivery Scheduled: Yes, Expected medication delivery date: 073020.     Medication will be delivered via UPS to the home address in Epic WAM.    [name] D [name]   [facility] Specialty Technician

## 2018-11-19 NOTE — Unmapped (Signed)
[facility] Refill Coordination Note    Specialty Medication(s) to be Shipped:   Inflammatory Disorders: Humira    Other medication(s) to be shipped: na     Dustin Prince, DOB: 23-Dec-1960  Phone: [phone] (home)       All above HIPAA information was verified with patient.     Completed refill call assessment today to schedule patient's medication shipment from the [facility] [phone]).       Specialty medication(s) and dose(s) confirmed: Regimen is correct and unchanged.   Changes to medications: Dalvin reports no changes at this time.  Changes to insurance: No  Questions for the pharmacist: No    Confirmed patient received Welcome Packet with first shipment. The patient will receive a drug information handout for each medication shipped and additional FDA Medication Guides as required.       DISEASE/MEDICATION-SPECIFIC INFORMATION        For patients on injectable medications: Patient currently has 2 doses left.  Next injection is scheduled for [id number].    SPECIALTY MEDICATION ADHERENCE     Medication Adherence    Patient reported X missed doses in the last month: 0  Specialty Medication: humira cf 40 mg/0.4 ml   Patient is on additional specialty medications: No  Patient is on more than two specialty medications: No  Any gaps in refill history greater than 2 weeks in the last 3 months: no  Demonstrates understanding of importance of adherence: yes  Informant: patient  Reliability of informant: reliable  Support network for adherence: family member  Confirmed plan for next specialty medication refill: delivery by pharmacy  Refills needed for supportive medications: not needed                humira cf 40 mg/0.4 ml . 14 days on hand       SHIPPING     Shipping address confirmed in Epic.     Delivery Scheduled: Yes, Expected medication delivery date: 082620.     Medication will be delivered via UPS to the home address in Epic WAM.    Leiloni Smithers D Elexius Minar   [facility] Shared [facility] Specialty Technician

## 2018-11-24 MED FILL — HUMIRA PEN CITRATE FREE 40 MG/0.4 ML: 28 days supply | Qty: 4 | Fill #7 | Status: AC

## 2018-11-24 MED FILL — HUMIRA PEN CITRATE FREE 40 MG/0.4 ML: 28 days supply | Qty: 4 | Fill #7

## 2018-11-30 ENCOUNTER — Encounter: Admit: 2018-11-30 | Discharge: 2018-12-01 | Payer: BLUE CROSS/BLUE SHIELD

## 2018-11-30 DIAGNOSIS — L910 Hypertrophic scar: Secondary | ICD-10-CM | POA: Diagnosis not present

## 2018-11-30 DIAGNOSIS — Z79899 Other long term (current) drug therapy: Secondary | ICD-10-CM | POA: Diagnosis not present

## 2018-11-30 DIAGNOSIS — B20 Human immunodeficiency virus [HIV] disease: Secondary | ICD-10-CM | POA: Diagnosis not present

## 2018-11-30 DIAGNOSIS — L732 Hidradenitis suppurativa: Secondary | ICD-10-CM | POA: Diagnosis not present

## 2018-11-30 DIAGNOSIS — Z7689 Persons encountering health services in other specified circumstances: Secondary | ICD-10-CM | POA: Diagnosis not present

## 2018-11-30 DIAGNOSIS — D485 Neoplasm of uncertain behavior of skin: Principal | ICD-10-CM

## 2018-11-30 MED ORDER — METRONIDAZOLE 250 MG TABLET
ORAL_TABLET | Freq: Three times a day (TID) | ORAL | 2 refills | 90.00000 days | Status: CP
Start: 2018-11-30 — End: ?

## 2018-11-30 MED ORDER — HYDROCODONE 5 MG-ACETAMINOPHEN 325 MG TABLET
ORAL_TABLET | 0 refills | 0 days | Status: CP
Start: 2018-11-30 — End: ?

## 2018-11-30 NOTE — Unmapped (Addendum)
ASSESSMENT/PLAN:    Severe Hurley 3 HS scrotal, inguinal and perirenal; improved on Humira, but flared with stopping metronidazole, in patient w/ hx of well controlled HIV  -We discussed the typical natural history, pathogenesis, treatment options, and expected course as well as the relapsing and sometimes recalcitrant nature of the disease.    -Continues to see marked improvement with Humira and metronidazole 250 mg TID which he is tolerating well  - Continue Humira 40mg  [place] weekly    - Continue metronidazole 250 mg TID. Counseled regarding risk of peripheral neuropathy with long-term use.  -pubic unroofing as below, may plan on similar procedure for L upper inguinal in a few months based on how he heels.    Unroofing procedure with excision for hidradedinitis in location Pubic area:  Risk, benefits, and alternatives were discussed.  Following alcohol prep, anesthesia with 0.5% lidocaine with epinephrine was first performed with a total of 35ml, and then fistula probe and/or forceps was used to delineate the involved area.  Iris scissor was used to open all detectable sinuses. Complex sequential unroofing and wound contouring with beveling was performed with scissor or blade as appropriate to assure second intention healing.  Hemostasis was obtained in the usual fashion with pressure, AlCl solution, and/or cautery.  Area was dressed with petrolatum and bandage.  Wound care instructions given. We will contact the patient with results when available.  Size of wound 45 x 35 mm.        High Risk Medication Use  -Quant gold negative 05/05/2018    Hx well controlled HIV; ID physician Dr. Lilli Prince aware and agrees with Humira    RTC: 2 week wound check, also 2 month followup  SUBJECTIVE:    CC: Hidradenitis Suppurativa    Dustin Prince is a 58 y.o. male last seen by Dr. Janyth Prince in 08/2018 here for f/u HS in groin. Currently on Humira 40mg  [place] weekly which helps greatly as well as metronidazole 250 mg TID restarted 09/04/2018. He previously flared after stopping the metronidazole. Notes that he had one spot on his left pubic area which was painful and draining last week. He particularly bothered by the smell of these lesions. Overall, notes that his condition is much improved from prior since starting the Humira and restarting the metronidazole at the start of the month.    History of well-controlled HIV antiretroviral on ART.  Sees Dr. Ninetta Prince in [place].      Social History:  Current or former smoker? never  Amount smoking: n/a  How many years: n/a  ED visits in the last 5 years? 6-10  Difficulty affording medications? most of the time  Marital Status: in relationship  Living with some one? Yes.    Prior treatments:  Topical: topical ABX  Systemic: clidnamycin; doxy (not very helpful),   Past surgical procedures: large WLE left axilla c/b dehisence   Past laser procedures: none      ROS: the balance of 10 systems is negative unless otherwise documented      OBJECTIVE:   Gen: Well-appearing patient, appropriate, interactive, in no acute distress  Skin: Examination of the scalp, face, neck, chest, back, abdomen, bilateral upper and lower extremities, hands, palms, soles, nails, buttocks, and external genitalia performed today and pertinent for:   -Cribriform scarring of scrotum, inguinal creases, and pubic area. 1 cm non draining sinus of central pubic area. Larger non-draining sinus of right scrotum/perineum.  -one IN, 1 draining sinus of infraabdomen ~3cm  -L inguinal 2 IN, 1  draining sinus 4-5cm  -perineum/scrotum with 7cm draining sinus  -All other areas not specifically commented on are within normal limits

## 2018-11-30 NOTE — Unmapped (Signed)
Managing Your Wound After Skin Surgery  ? Please avoid strenuous activity for 48 hours and all heavy exercise for one week.  o If your wound is on your arm, leg, or shoulder, then avoid heavy lifting, straining, or exercise with that limb for at least one week.  o If your wound is on the head or neck, avoid bending over and sleep with your head elevated for 48 hours to reduce the risk of bleeding.  o Please do not smoke for 3 weeks; smoking prevents healthy wound healing.  ? Pain Management: Use 650mg  of acetaminophen (Tylenol) every 4 hours, or medication that we prescribe as needed.  Pain should decrease steadily for the first few days after your surgery.  o Avoid ibuprofen (Motrin), naproxen (Aleve), aspirin, or alcohol for 48 hours because they can increase the risk of bleeding.  You should continue aspirin if you already take it regularly.  ? Possible complications:  o Bleeding: A small amount of blood on the bandage is normal.  For significant bleeding into the bandage or beneath the stitches (this will look like swelling and purple discoloration), apply firm pressure with a cloth or bandage for 20 minutes without interruption.  Repeat if bleeding continues.  If it persists, please call [phone].  During non-office hours use the message at this number to contact the on-call dermatologist or go to the emergency room.  o Infection:  Usually indicated by pain that increases 4-6 days after surgery. Mild redness, swelling, and soreness are normal, but with increasing redness, pain, drainage, and swelling you should contact our office.  o Decreased sensation, increased sensitivity, and itching may last for 18 months.  o Scarring: Scars mature for one year after surgery.  Reducing motion and tension over the wound for the first month, as well as sun-protection, reduces scarring.    o Bruising is normal around the wound and resolves over 2-3 weeks.  For Wounds with Dissolving Stitches:  ? Please keep the original bandage in place for at least 24 hours.  You can remove the large outer layer at that time and keep the smaller bandage clean and dry for one week or until you return.    ? If the bandage becomes soiled, wet, or detached you can reinforce it with tape or add petrolatum (Vaseline) to the stitches, then place a fresh, clean, non-stick bandage (ie Telfa) with tape or Band-aid.  For Wounds with Stitches that Need to be Removed:  ? Please keep the original bandage in place for at least 24 hours.  You can remove the large outer layer at that time.  You may leave the smaller bandage in place until it falls off by itself, or change it after it gets wet with bathing.  To change, apply a thick layer of petrolatum (Vaseline) over top of the Steri-strips (thin white strips) and cover with a non-stick bandage (ie Telfa) with tape or a  Band-aid.  If you are changing the bandage you can gently remove any crust with warm water and mild soap using a soft cloth or Q-tip.  Do not use triple-antibiotic ointment, Neosporin, or hydrogen peroxide.    For open wounds:  Each day or each time the dressing is soiled or disrupted, gently remove any excess crusting or oozing with a warm wet wash cloth or gently allow water to run over the area. Apply a thick layer of Vaseline, and then re-cover the area with a non-stick bandage and tape or another bandage of your  choosing. The thick layer of Vaseline will help soothe the pain of the wound and allow it to heal faster.    If crusting builds up a warm washcloth with mild soap or a mixture of 1 tablespoon of white vinegar in a cup of warm can be applied to the area for 5-10 minutes and then used to gently wipe away any excess.    Healing is best when the wound fills in from the bottom and sides. If the edges of the wound are close together it is important to keep the Vaseline in the wound and use the non-stick dressing pressed gently into the wound to keep the edges from sealing together first.    After the Stitches and Bandages Have Been Removed:  Clean daily with warm water and gentle soap using a soft cloth.  You can continue to use a small bandage and sunscreen for 1-2 months or until fully healed.

## 2018-12-02 NOTE — Unmapped (Signed)
No tracking.  Contacted patient with result via MyChart on 12/02/18

## 2018-12-15 DIAGNOSIS — H40033 Anatomical narrow angle, bilateral: Secondary | ICD-10-CM | POA: Diagnosis not present

## 2018-12-15 DIAGNOSIS — H40013 Open angle with borderline findings, low risk, bilateral: Secondary | ICD-10-CM | POA: Diagnosis not present

## 2018-12-16 ENCOUNTER — Other Ambulatory Visit: Payer: Self-pay

## 2018-12-16 DIAGNOSIS — R6889 Other general symptoms and signs: Secondary | ICD-10-CM | POA: Diagnosis not present

## 2018-12-16 DIAGNOSIS — Z20822 Contact with and (suspected) exposure to covid-19: Secondary | ICD-10-CM

## 2018-12-17 NOTE — Unmapped (Signed)
[facility] Refill Coordination Note    Specialty Medication(s) to be Shipped:   Inflammatory Disorders: Humira    Other medication(s) to be shipped: na     Greene Diodato, DOB: [birth year]  Phone: [phone] (home)       All above HIPAA information was verified with patient.     Completed refill call assessment today to schedule patient's medication shipment from the [facility] [phone]).       Specialty medication(s) and dose(s) confirmed: Regimen is correct and unchanged.   Changes to medications: Jayston reports no changes at this time.  Changes to insurance: No  Questions for the pharmacist: No    Confirmed patient received Welcome Packet with first shipment. The patient will receive a drug information handout for each medication shipped and additional FDA Medication Guides as required.       DISEASE/MEDICATION-SPECIFIC INFORMATION        For patients on injectable medications: Patient currently has 2 doses left.  Next injection is scheduled for 092220.    SPECIALTY MEDICATION ADHERENCE     Medication Adherence    Patient reported X missed doses in the last month: 0  Specialty Medication: humira cf 40 mg/0.4 ml  Patient is on additional specialty medications: No  Any gaps in refill history greater than 2 weeks in the last 3 months: no  Demonstrates understanding of importance of adherence: yes  Informant: patient  Reliability of informant: reliable  Support network for adherence: family member  Confirmed plan for next specialty medication refill: delivery by pharmacy  Refills needed for supportive medications: not needed                humira cf 40 mg/0.4 ml. 14 days on hand      SHIPPING     Shipping address confirmed in Epic.     Delivery Scheduled: Yes, Expected medication delivery date: 092320.     Medication will be delivered via UPS to the home address in Epic WAM.    Lekesha Claw D Shalie Schremp   [facility] Specialty Technician

## 2018-12-18 LAB — NOVEL CORONAVIRUS, NAA: SARS-CoV-2, NAA: NOT DETECTED

## 2018-12-22 ENCOUNTER — Ambulatory Visit (INDEPENDENT_AMBULATORY_CARE_PROVIDER_SITE_OTHER): Payer: Medicaid Other | Admitting: Neurology

## 2018-12-22 ENCOUNTER — Encounter: Payer: Self-pay | Admitting: Neurology

## 2018-12-22 ENCOUNTER — Other Ambulatory Visit: Payer: Self-pay

## 2018-12-22 VITALS — BP 171/78 | HR 50 | Ht 72.0 in | Wt 343.0 lb

## 2018-12-22 DIAGNOSIS — G4752 REM sleep behavior disorder: Secondary | ICD-10-CM

## 2018-12-22 DIAGNOSIS — Z7689 Persons encountering health services in other specified circumstances: Secondary | ICD-10-CM | POA: Diagnosis not present

## 2018-12-22 MED FILL — HUMIRA PEN CITRATE FREE 40 MG/0.4 ML: 28 days supply | Qty: 4 | Fill #8

## 2018-12-22 MED FILL — HUMIRA PEN CITRATE FREE 40 MG/0.4 ML: 28 days supply | Qty: 4 | Fill #8 | Status: AC

## 2018-12-22 NOTE — Patient Instructions (Signed)
You did not have any improvement in your symptoms, ie dream enactment behavior with a trial of clonazepam. If needed, in the future, we can try it again at a higher dose if you want, for now, we can certainly play it by ear and I would be happy to see you back on an as-needed basis.
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
## 2019-01-08 DIAGNOSIS — N189 Chronic kidney disease, unspecified: Secondary | ICD-10-CM | POA: Diagnosis not present

## 2019-01-08 DIAGNOSIS — N183 Chronic kidney disease, stage 3 unspecified: Secondary | ICD-10-CM | POA: Diagnosis not present

## 2019-01-11 DIAGNOSIS — I519 Heart disease, unspecified: Secondary | ICD-10-CM | POA: Diagnosis not present

## 2019-01-11 DIAGNOSIS — L732 Hidradenitis suppurativa: Secondary | ICD-10-CM | POA: Diagnosis not present

## 2019-01-11 DIAGNOSIS — R0789 Other chest pain: Secondary | ICD-10-CM | POA: Diagnosis not present

## 2019-01-11 DIAGNOSIS — B20 Human immunodeficiency virus [HIV] disease: Secondary | ICD-10-CM | POA: Diagnosis not present

## 2019-01-11 DIAGNOSIS — M87052 Idiopathic aseptic necrosis of left femur: Secondary | ICD-10-CM | POA: Diagnosis not present

## 2019-01-11 DIAGNOSIS — I82409 Acute embolism and thrombosis of unspecified deep veins of unspecified lower extremity: Secondary | ICD-10-CM | POA: Diagnosis not present

## 2019-01-11 DIAGNOSIS — I129 Hypertensive chronic kidney disease with stage 1 through stage 4 chronic kidney disease, or unspecified chronic kidney disease: Secondary | ICD-10-CM | POA: Diagnosis not present

## 2019-01-11 DIAGNOSIS — N183 Chronic kidney disease, stage 3 unspecified: Secondary | ICD-10-CM | POA: Diagnosis not present

## 2019-01-12 DIAGNOSIS — Z23 Encounter for immunization: Secondary | ICD-10-CM | POA: Diagnosis not present

## 2019-01-15 NOTE — Unmapped (Signed)
[facility] Refill Coordination Note    Specialty Medication(s) to be Shipped:   Inflammatory Disorders: Humira    Other medication(s) to be shipped: na     Dustin Prince, DOB: 12/17/1960  Phone: [phone] (home)       All above HIPAA information was verified with patient.     Completed refill call assessment today to schedule patient's medication shipment from the [facility] [phone]).       Specialty medication(s) and dose(s) confirmed: Regimen is correct and unchanged.   Changes to medications: Lehman reports no changes at this time.  Changes to insurance: No  Questions for the pharmacist: No    Confirmed patient received Welcome Packet with first shipment. The patient will receive a drug information handout for each medication shipped and additional FDA Medication Guides as required.       DISEASE/MEDICATION-SPECIFIC INFORMATION        For patients on injectable medications: Patient currently has 2 doses left.  Next injection is scheduled for 102020.    SPECIALTY MEDICATION ADHERENCE     Medication Adherence    Patient reported X missed doses in the last month: 0  Specialty Medication: humira cf 40 mg/0.4 ml  Patient is on additional specialty medications: No  Any gaps in refill history greater than 2 weeks in the last 3 months: no  Demonstrates understanding of importance of adherence: yes  Informant: patient  Reliability of informant: reliable  Support network for adherence: family member  Confirmed plan for next specialty medication refill: delivery by pharmacy  Refills needed for supportive medications: not needed                humira cf 40 mg/0.4 ml. 14 days on hand      SHIPPING     Shipping address confirmed in Epic.     Delivery Scheduled: Yes, Expected medication delivery date: 102720.     Medication will be delivered via UPS to the home address in Epic WAM.    Kaelah Hayashi D Evander Macaraeg   [facility] Specialty Technician

## 2019-01-25 MED FILL — HUMIRA PEN CITRATE FREE 40 MG/0.4 ML: 28 days supply | Qty: 4 | Fill #9 | Status: AC

## 2019-01-25 MED FILL — HUMIRA PEN CITRATE FREE 40 MG/0.4 ML: 28 days supply | Qty: 4 | Fill #9

## 2019-01-26 DIAGNOSIS — N183 Chronic kidney disease, stage 3 unspecified: Secondary | ICD-10-CM | POA: Diagnosis not present

## 2019-01-26 DIAGNOSIS — I129 Hypertensive chronic kidney disease with stage 1 through stage 4 chronic kidney disease, or unspecified chronic kidney disease: Secondary | ICD-10-CM | POA: Diagnosis not present

## 2019-02-03 NOTE — Unmapped (Signed)
Called pateint to verf appt    Thanks  [facility]

## 2019-02-03 NOTE — Unmapped (Signed)
Patient called nurse line to verify appointment so he can make travel plans.

## 2019-02-07 ENCOUNTER — Other Ambulatory Visit: Payer: Self-pay | Admitting: Internal Medicine

## 2019-02-07 DIAGNOSIS — I82409 Acute embolism and thrombosis of unspecified deep veins of unspecified lower extremity: Secondary | ICD-10-CM

## 2019-02-07 DIAGNOSIS — I825Z2 Chronic embolism and thrombosis of unspecified deep veins of left distal lower extremity: Secondary | ICD-10-CM

## 2019-02-10 ENCOUNTER — Telehealth: Payer: Self-pay | Admitting: Internal Medicine

## 2019-02-10 NOTE — Telephone Encounter (Signed)
Please call [place] back.  Dr. Frederico Hamman' DEA is not valid.  Please call pharmacy back.

## 2019-02-10 NOTE — Telephone Encounter (Signed)
Called and gave dr butcher's name and NPI

## 2019-02-10 NOTE — Telephone Encounter (Signed)
Please call pt's [place] back.  Dr Frederico Hamman' DEA is not valid.Marland Kitchen

## 2019-02-15 ENCOUNTER — Ambulatory Visit: Admit: 2019-02-15 | Discharge: 2019-02-16 | Payer: BLUE CROSS/BLUE SHIELD

## 2019-02-15 DIAGNOSIS — Z7689 Persons encountering health services in other specified circumstances: Secondary | ICD-10-CM | POA: Diagnosis not present

## 2019-02-15 NOTE — Unmapped (Deleted)
Self-reported severity (0-5): 2  VAS pain today: 2  VAS average pain for the last month: {numbers 0-10:5044}  Requiring pain medication? No.  If so, what type/frequency?   How often in pain?  few times a month  Level of odor (0-5): 4  Level of itching (0-5): 2  Dressing changes needed for drainage:Once a week  How much drainage: a little drainage  Flare in the last month (Y/N)? {YES/NO:21013}  How long ago was the last flare? {last flare:53297}  Developing new lesions? less than monthly  Number of inflammatory lesions montly: 1-3  DLQI: ***  Current treatment:  Humira  Metronidazole  How helpful is the current treatment in managing the following aspects of your disease?  Not at all helpful Somewhat helpful Very helpful   Pain   x   Decreasing length of flares  x    Decreasing new lesions   x   Drainage  x    Decreasing frequency of flares   x   Decreasing severity of flares   x   Odor x

## 2019-02-15 NOTE — Unmapped (Signed)
ASSESSMENT/PLAN:    Severe Hurley 3 HS scrotal, inguinal and perirenal; improved on Humira, but flared with stopping metronidazole, in patient w/ hx of well controlled HIV  -We discussed the typical natural history, pathogenesis, treatment options, and expected course as well as the relapsing and sometimes recalcitrant nature of the disease.    -Continues to see marked improvement with Humira and metronidazole 250 mg TID which he is tolerating well  - Continue Humira 40mg  [place] weekly    - Continue metronidazole 250 mg TID. Counseled regarding risk of peripheral neuropathy with long-term use.  -he would like to hold on scrotal surgery for now if possible, which is the main area that bothers him at this point.  Discussed potential use of intralesional gentamicin, which is a it atypical, but which has seemed helpful for another patient recenlty and would be minimally invasive.  Will set him up to come back for this in the next couple of months..    High Risk Medication Use  -Quant gold negative 05/05/2018    Hx well controlled HIV; ID physician Dr. Lilli Light aware and agrees with Humira    RTC: 2 week wound check, also 2 month followup  SUBJECTIVE:    CC: Hidradenitis Suppurativa    Dustin Prince is a 58 y.o. male last seen by Dr. Janyth Contes in 10/2018 here for f/u HS in groin. SIte of unroofing in pubic area has healed very well.  Currently on Humira 40mg  [place] weekly which helps greatly as well as metronidazole 250 mg TID restarted 09/04/2018. He previously flared after stopping the metronidazole. Notes that he had one spot on his left pubic area which was painful and draining last week. He particularly bothered by the smell of these lesions. Overall, notes that his condition is much improved from prior since starting the Humira and restarting the metronidazole at the start of the month.    History of well-controlled HIV antiretroviral on ART.  Sees Dr. Ninetta Lights in [place].    Self-reported severity (0-5): 2  VAS pain today: 2 VAS average pain for the last month: not answered  Requiring pain medication? No.  If so, what type/frequency?   How often in pain?  few times a month  Level of odor (0-5): 4  Level of itching (0-5): 2  Dressing changes needed for drainage:Once a week  How much drainage: a little drainage  Flare in the last month (Y/N)? No.  How long ago was the last flare? more than one year ago  Developing new lesions? less than monthly  Number of inflammatory lesions montly: 1-3  DLQI: -  Current treatment:  Humira  Metronidazole  How helpful is the current treatment in managing the following aspects of your disease?  Not at all helpful Somewhat helpful Very helpful   Pain ?? ?? x   Decreasing length of flares ?? x ??   Decreasing new lesions ?? ?? x   Drainage ?? x ??   Decreasing frequency of flares ?? ?? x   Decreasing severity of flares ?? ?? x   Odor x ?? ??   ??        Social History:  Current or former smoker? never  Amount smoking: n/a  How many years: n/a  ED visits in the last 5 years? 6-10  Difficulty affording medications? most of the time  Marital Status: in relationship  Living with some one? Yes.    Prior treatments:  Topical: topical ABX  Systemic: clidnamycin; doxy (not very helpful),  Past surgical procedures: large WLE left axilla c/b dehisence   Past laser procedures: none      ROS: the balance of 10 systems is negative unless otherwise documented      OBJECTIVE:   Gen: Well-appearing patient, appropriate, interactive, in no acute distress  Skin: Examination of the scalp, face, neck, chest, back, abdomen, bilateral upper and lower extremities, hands, palms, soles, nails, buttocks, and external genitalia performed today and pertinent for:   -Cribriform scarring of scrotum, inguinal creases, and pubic area. Larger non-draining sinus of right scrotum/perineum.  -L inguinal 2 IN, 1 draining sinus 4-5cm  -perineum/scrotum with 7cm draining sinus, 1 IN  -All other areas not specifically commented on are within normal limits

## 2019-02-15 NOTE — Unmapped (Signed)
Patient Education      Patient Education        Places on are are called Lipomas        Hidradenitis Suppurativa: Care Instructions  Your Care Instructions     Hidradenitis suppurativa (say hih-drad-uh-NY-tus sup-yur-uh-TY-vuh) is a skin condition that causes lumps on the skin that look like pimples or boils. The lumps are usually painful and can break open and drain blood and bad-smelling pus. The condition can come and go for many years.  Treatment for this condition may include antibiotics and other medicines. You may need surgery to remove the lumps. Home care includes wearing loose-fitting clothes and washing the area gently. You can help prevent lumps from coming back by staying at a healthy weight and not smoking.  Doctors don't know exactly how this condition starts. But they do know that something irritates and inflames the hair follicles, causing them to swell and form lumps. This skin condition can't be spread from person to person (isn't contagious).  Follow-up care is a key part of your treatment and safety. Be sure to make and go to all appointments, and call your doctor if you are having problems. It's also a good idea to know your test results and keep a list of the medicines you take.  How can you care for yourself at home?  Skin care  ?? ?? Wash the area every day with mild soap. Use your hands rather than a washcloth or sponge when you wash that part of your body.   ?? ?? Leave the affected areas uncovered when you can. If you have lumps that are draining, you can cover them with a bandage or other dressing. Put petroleum jelly (such as Vaseline) on the dressing to help keep it from sticking.   ?? ?? Wear-loose fitting clothes that don't rub against the area. Avoid activities that cause skin to rub together.   ?? ?? If you have pain, try a warm compress. Soak a towel or washcloth in warm water, wring it out, and place it on the affected skin for about 10 minutes.   Medicines ?? ?? Be safe with medicines. Take your medicines exactly as prescribed. Call your doctor if you think you are having a problem with your medicine. You will get more details on the specific medicines your doctor prescribes.   ?? ?? If your doctor prescribed antibiotics, take them as directed. Do not stop taking them just because you feel better. You need to take the full course of antibiotics.   Lifestyle choices  ?? ?? If you smoke, think about quitting. Smoking can make the condition worse. If you need help quitting, talk to your doctor about stop-smoking programs and medicines. These can increase your chances of quitting for good.   ?? ?? Stay at a healthy weight, or lose weight, by eating healthy foods and being physically active. Being overweight could make this condition worse.   When should you call for help?   Call your doctor now or seek immediate medical care if:  ?? ?? You have symptoms of infection, such as:  ? Increased pain, swelling, warmth, or redness.  ? Red streaks leading from the area.  ? Pus draining from the area.  ? A fever.   Watch closely for changes in your health, and be sure to contact your doctor if:  ?? ?? You do not get better as expected.   Where can you learn more?  Go to [facility] at https://myuncchart.org  Select Patient Education under [employer]. Enter [id number] in the search box to learn more about Hidradenitis Suppurativa: Care Instructions.  Current as of: October 01, 2018??????????????????????????????Content Version: 12.6  ?? 2006-2020 Healthwise, Inc[employer].   Care instructions adapted under license by [facility]. If you have questions about a medical condition or this instruction, always ask your healthcare professional. Healthwise, Incorporated disclaims any warranty or liability for your use of this information.

## 2019-02-22 MED ORDER — GENTAMICIN SULFATE (PEDIATRIC) (PF) 20 MG/2 ML INJECTION SOLUTION
10 refills | 0 days
Start: 2019-02-22 — End: ?

## 2019-02-23 NOTE — Unmapped (Signed)
[name] reports things are stable with his Humira. He is continuing on metronidazole, and will soon start irrigation with gentamicin in clinic.     [facility] Clinical Assessment & Refill Coordination Note    Dustin Prince, [place]: [birth year]  Phone: [phone] (home)     All above HIPAA information was verified with patient.     Specialty Medication(s):   Inflammatory Disorders: Humira     Current Outpatient Medications   Medication Sig Dispense Refill   ??? ADALIMUMAB PEN CITRATE FREE 40 MG/0.4 ML INJECT THE CONTENTS OF 1 PEN (40MG ) UNDER THE SKIN ONCE EACH WEEK 4 each 11   ??? albuterol HFA 90 mcg/actuation inhaler Inhale 2 puffs.     ??? bictegrav-emtricit-tenofov ala (BIKTARVY) 50-200-25 mg tablet Take 1 tablet by mouth daily.     ??? CLONAZEPAM ORAL Take by mouth.     ??? cyclobenzaprine (FLEXERIL) 5 MG tablet TAKE 1 TABLET BY MOUTH AT BEDTIME     ??? furosemide (LASIX) 20 MG tablet Take 20 mg by mouth daily.     ??? gentamicin, PF, (GARAMYCIN) 10 mg/mL Soln Take 2 vials to dermatology appointment three times a week as directed 20 mL 10   ??? HYDROcodone-acetaminophen (NORCO) 5-325 mg per tablet Take 1-2 tablets every 6 hours as needed for pain 15 tablet 0   ??? metroNIDAZOLE (FLAGYL) 250 MG tablet Take 1 tablet (250 mg total) by mouth Three (3) times a day. 270 tablet 2   ??? pantoprazole (PROTONIX) 40 MG tablet Take 40 mg by mouth daily.     ??? rivaroxaban (XARELTO) 20 mg tablet Take 20 mg by mouth daily with evening meal.       No current facility-administered medications for this visit.         Changes to medications: Dustin reports no changes at this time.    Allergies   Allergen Reactions   ??? Sulfasalazine      Other reaction(s): Other (See Comments)  Low blood pressure   ??? Vancomycin Itching     Give with benadryl       Changes to allergies: No    SPECIALTY MEDICATION ADHERENCE     Humira - 1 dose left    Medication Adherence    Patient reported X missed doses in the last month: 0 Specialty Medication: Humira  Patient is on additional specialty medications: No  Support network for adherence: family member          Specialty medication(s) dose(s) confirmed: Regimen is correct and unchanged.     Are there any concerns with adherence? No    Adherence counseling provided? Not needed    CLINICAL MANAGEMENT AND INTERVENTION      Clinical Benefit Assessment:    Do you feel the medicine is effective or helping your condition? Yes    Clinical Benefit counseling provided? Not needed    Adverse Effects Assessment:    Are you experiencing any side effects? No    Are you experiencing difficulty administering your medicine? No    Quality of Life Assessment:    How many days over the past month did your HS  keep you from your normal activities? For example, brushing your teeth or getting up in the morning. 0    Have you discussed this with your provider? Not needed    Therapy Appropriateness:    Is therapy appropriate? Yes, therapy is appropriate and should be continued    DISEASE/MEDICATION-SPECIFIC INFORMATION  For patients on injectable medications: Patient currently has 1 doses left.  Next injection is scheduled for today.    PATIENT SPECIFIC NEEDS     ? Does the patient have any physical, cognitive, or cultural barriers? No    ? Is the patient high risk? No     ? Does the patient require a Care Management Plan? No     ? Does the patient require physician intervention or other additional services (i.e. nutrition, smoking cessation, social work)? No      SHIPPING     Specialty Medication(s) to be Shipped:   Inflammatory Disorders: Humira    Other medication(s) to be shipped: na     Changes to insurance: No    Delivery Scheduled: Yes, Expected medication delivery date: Tues, Dec 1.     Medication will be delivered via UPS to the confirmed prescription address in [facility]. The patient will receive a drug information handout for each medication shipped and additional FDA Medication Guides as required.  Verified that patient has previously received a Conservation officer, historic buildings.    All of the patient's questions and concerns have been addressed.    Lanney Gins   [facility] Specialty Pharmacist

## 2019-03-01 ENCOUNTER — Encounter: Payer: Self-pay | Admitting: Internal Medicine

## 2019-03-01 ENCOUNTER — Other Ambulatory Visit: Payer: Self-pay

## 2019-03-01 ENCOUNTER — Ambulatory Visit (INDEPENDENT_AMBULATORY_CARE_PROVIDER_SITE_OTHER): Payer: Medicaid Other | Admitting: Internal Medicine

## 2019-03-01 VITALS — BP 160/79 | HR 46 | Temp 98.0°F | Ht 72.0 in | Wt 346.9 lb

## 2019-03-01 DIAGNOSIS — N189 Chronic kidney disease, unspecified: Secondary | ICD-10-CM

## 2019-03-01 DIAGNOSIS — Z6841 Body Mass Index (BMI) 40.0 and over, adult: Secondary | ICD-10-CM

## 2019-03-01 DIAGNOSIS — Z7901 Long term (current) use of anticoagulants: Secondary | ICD-10-CM

## 2019-03-01 DIAGNOSIS — I129 Hypertensive chronic kidney disease with stage 1 through stage 4 chronic kidney disease, or unspecified chronic kidney disease: Secondary | ICD-10-CM | POA: Diagnosis not present

## 2019-03-01 DIAGNOSIS — Z21 Asymptomatic human immunodeficiency virus [HIV] infection status: Secondary | ICD-10-CM | POA: Diagnosis not present

## 2019-03-01 DIAGNOSIS — I10 Essential (primary) hypertension: Secondary | ICD-10-CM

## 2019-03-01 DIAGNOSIS — R2243 Localized swelling, mass and lump, lower limb, bilateral: Secondary | ICD-10-CM

## 2019-03-01 DIAGNOSIS — R2233 Localized swelling, mass and lump, upper limb, bilateral: Secondary | ICD-10-CM

## 2019-03-01 DIAGNOSIS — R222 Localized swelling, mass and lump, trunk: Secondary | ICD-10-CM

## 2019-03-01 DIAGNOSIS — R079 Chest pain, unspecified: Secondary | ICD-10-CM

## 2019-03-01 DIAGNOSIS — R229 Localized swelling, mass and lump, unspecified: Secondary | ICD-10-CM | POA: Insufficient documentation

## 2019-03-01 DIAGNOSIS — Z79899 Other long term (current) drug therapy: Secondary | ICD-10-CM | POA: Diagnosis not present

## 2019-03-01 MED FILL — HUMIRA PEN CITRATE FREE 40 MG/0.4 ML: 28 days supply | Qty: 4 | Fill #10

## 2019-03-01 MED FILL — GENTAMICIN SULFATE (PEDIATRIC) (PF) 20 MG/2 ML INJECTION SOLUTION: 10 days supply | Qty: 20 | Fill #0

## 2019-03-01 NOTE — Assessment & Plan Note (Addendum)
Patient presents complaining of L-sided chest pain that occurs 2-3 times per day and is associated with shortness of breath and numbness under his left arm.  He states this is associated with food intake at times, but not all the time. He has been on both omeprazole and Protonix in the past with little to no relief in chest pain.  He has also noticed shortness of breath when going upstairs. This is a new symptom. He was seen by cardiology on 01/2018 at which time they did not think his chest pain was cardiac in nature, but they recommende a nuclear stress test that there was change in symptoms.  EKG done in clinic showed sinus bradycardia.signs of acute ischemia.  Essentially same as prior tracings.  I ordered an echocardiogram to evaluate LV function given new shortness of breath.  Referred back to cardiology for stress test.

## 2019-03-01 NOTE — Assessment & Plan Note (Addendum)
Patient presents complaining of diffuse subcutaneous nodules over bilateral upper and lower extremities as well as chest and abdomen.  He reports these have been present for a long time "years" but seem to be growing now.  He has a dermatologist he follows up with for hidradenitis suppurativa and reports discussing this with him.  Per patient, dermatology said these are fatty nodules that did not warrant further evaluation.  Patient is supposed to follow-up with them again next week.  I advised him to ask for a biopsy of one of these nodules for further evaluation.

## 2019-03-01 NOTE — Assessment & Plan Note (Signed)
Patient presents for weight follow up. Weight remains in the 330s, unchanged since last time. He reports not being able to exercise for the past few months due to Covid restriction in gyms.  He also has not been able to eat smaller meals like Butch Penny suggested because he stays hungry. I gave him information for bariatric surgery orientation last time I saw him. He did not go because he is not sure he wants the surgery. We discussed he can exercise outside with his mask on and does not have to go to a gym to do this. Continue to discuss a healthy diet. Advised against keto supplements which he brought to his visit today as these are not FDA approved and could interact with Xarelto and his HIV medications.

## 2019-03-01 NOTE — Progress Notes (Signed)
Internal Medicine Clinic Attending  Case discussed with Dr. Santos-Sanchez at the time of the visit.  We reviewed the resident's history and exam and pertinent patient test results.  I agree with the assessment, diagnosis, and plan of care documented in the resident's note.  Alexander Raines, M.D., Ph.D.  

## 2019-03-01 NOTE — Patient Instructions (Addendum)
Allen Espinoza,   For your chest pain, we obatined an EKG today. I ordered an ultrasound of your heart to check your heart function. Ia also made a new referral to cardiology for a stress test. They should be contacting you in about 1 week to set up an appointment.  Ask your dermatologist for a biopsy of the fatty deposits in your skin.  Please talk to your kidney doctor about your blood pressure. You may need more medicine to control it in addition to the water pill.   We will get back to you regarding if you can take the keto diet supplements that you brought your visit today.  Follow-up with me in 3 to 6 months.  - Dr. Frederico Hamman

## 2019-03-01 NOTE — Progress Notes (Signed)
   CC: Chest pain, bumps in skin, obesity, HTN   HPI:  [name] is a 58 y.o. year-old male with PMH listed below who presents to clinic for Chest pain, bumps in skin, obesity, HTN . Please see problem based assessment and plan for further details.   Past Medical History:  Diagnosis Date  . Abscess    Between legs  . Arthritis    all over  . Avascular necrosis of femoral head, left ([place])   . Cholelithiasis   . CKD (chronic kidney disease) stage 3, GFR 30-59 ml/min    sees kidney Dr.  . Diverticulosis   . DVT (deep venous thrombosis) ([facility])    legs  . Dyspnea    when walking  . Dysrhythmia    remembers mother taking about having an irregular rhythm years when he was a child   . GERD (gastroesophageal reflux disease)   . HIV (human immunodeficiency virus infection) ([place])   . Morbid obesity ([place])    Review of Systems:   Review of Systems  Constitutional: Negative for chills, diaphoresis, fever, malaise/fatigue and weight loss.  Respiratory: Positive for shortness of breath. Negative for cough and sputum production.   Cardiovascular: Positive for chest pain and leg swelling. Negative for palpitations, orthopnea and PND.  Gastrointestinal: Negative for abdominal pain, nausea and vomiting.  Neurological: Negative for dizziness and headaches.    Physical Exam:  Vitals:   03/01/19 0834 03/01/19 0839  BP: (!) 165/73 (!) 153/71  Pulse: (!) 49 (!) 46  Temp: 98 F (36.7 C)   TempSrc: Oral   SpO2: 100%   Weight: (!) 346 lb 14.4 oz (157.4 kg)   Height: 6' (1.829 m)     General: Well-appearing male in no acute distress Cardiac: RRR, distant heart sounds due to body habitus,  nl S1/S2, no murmurs, rubs or gallops, no JVD  Pulm: CTAB, no wheezes or crackles, no increased work of breathing on room air  Ext: warm and well perfused, 1+ peripheral edema bilaterally Derm: numerous boggy SQ nodules on bilateral UE and LE as well as chest and abdomen.  No signs of infection noted    Assessment & Plan:   See Encounters Tab for problem based charting.  Patient discussed with Dr. Rebeca Alert

## 2019-03-01 NOTE — Assessment & Plan Note (Addendum)
Blood pressure remains elevated at 160/79.  He has expressed hesitation about starting an antihypertensive medication without talking to his nephrologist first given history of advanced CKD.  He was supposed to do this last time I saw him,but forgot to mention it.  He was under the impression that furosemide was started for tx of hypertension but I discussed with patient this is LE swelling.  Strongly advised him to talk to nephrology as soon as possible for treatment of high blood pressure. Also encouraged weight loss.

## 2019-03-02 ENCOUNTER — Ambulatory Visit (HOSPITAL_COMMUNITY): Payer: Medicaid Other | Attending: Cardiovascular Disease

## 2019-03-02 DIAGNOSIS — R079 Chest pain, unspecified: Secondary | ICD-10-CM | POA: Insufficient documentation

## 2019-03-02 MED ORDER — PERFLUTREN LIPID MICROSPHERE
1.0000 mL | INTRAVENOUS | Status: AC | PRN
  Administered 2019-03-02: 3 mL via INTRAVENOUS

## 2019-03-02 NOTE — Unmapped (Signed)
Patient needs an appointment to see Dr. Janyth Contes.  Dr. Janyth Contes, Mr Hurlbut called to let you know he received his medication and needs to come in for intralesional injections.  [facility]

## 2019-03-03 ENCOUNTER — Other Ambulatory Visit: Payer: Medicaid Other

## 2019-03-03 ENCOUNTER — Other Ambulatory Visit: Payer: Self-pay

## 2019-03-03 ENCOUNTER — Other Ambulatory Visit (HOSPITAL_COMMUNITY)
Admission: RE | Admit: 2019-03-03 | Discharge: 2019-03-03 | Disposition: A | Discharge status: Home / Self Care | Payer: Medicaid Other | Source: Ambulatory Visit | Attending: Infectious Diseases | Admitting: Infectious Diseases

## 2019-03-03 ENCOUNTER — Other Ambulatory Visit: Payer: Self-pay | Admitting: Infectious Diseases

## 2019-03-03 DIAGNOSIS — B20 Human immunodeficiency virus [HIV] disease: Secondary | ICD-10-CM | POA: Diagnosis not present

## 2019-03-03 DIAGNOSIS — Z113 Encounter for screening for infections with a predominantly sexual mode of transmission: Secondary | ICD-10-CM

## 2019-03-03 NOTE — Unmapped (Signed)
Where can this patient be added?

## 2019-03-03 NOTE — Unmapped (Signed)
Pt has been added for both.  For 03-15-2019 do you want to add a surgery or a return HS?

## 2019-03-04 LAB — T-HELPER CELL (CD4) - (RCID CLINIC ONLY)
CD4 % Helper T Cell: 17 % — ABNORMAL LOW (ref 33–65)
CD4 T Cell Abs: 401 /uL (ref 400–1790)

## 2019-03-09 LAB — HIV-1 RNA QUANT-NO REFLEX-BLD
HIV 1 RNA Quant: <20 copies/mL
HIV-1 RNA Quant, Log: <1.3 Log copies/mL

## 2019-03-09 LAB — CBC
HCT: 43.6 % (ref 38.5–50.0)
Hemoglobin: 14.9 g/dL (ref 13.2–17.1)
MCH: 34.7 pg — ABNORMAL HIGH (ref 27.0–33.0)
MCHC: 34.2 g/dL (ref 32.0–36.0)
MCV: 101.4 fL — ABNORMAL HIGH (ref 80.0–100.0)
MPV: 10.2 fL (ref 7.5–12.5)
Platelets: 223 10*3/uL (ref 140–400)
RBC: 4.3 10*6/uL (ref 4.20–5.80)
RDW: 12.2 % (ref 11.0–15.0)
WBC: 6.4 10*3/uL (ref 3.8–10.8)

## 2019-03-09 LAB — COMPREHENSIVE METABOLIC PANEL
AG Ratio: 1 (calc) (ref 1.0–2.5)
ALT: 12 U/L (ref 9–46)
AST: 15 U/L (ref 10–35)
Albumin: 3.8 g/dL (ref 3.6–5.1)
Alkaline phosphatase (APISO): 80 U/L (ref 35–144)
BUN/Creatinine Ratio: 9 (calc) (ref 6–22)
BUN: 13 mg/dL (ref 7–25)
CO2: 29 mmol/L (ref 20–32)
Calcium: 9.1 mg/dL (ref 8.6–10.3)
Chloride: 106 mmol/L (ref 98–110)
Creat: 1.48 mg/dL — ABNORMAL HIGH (ref 0.70–1.25)
Globulin: 3.8 g/dL (calc) — ABNORMAL HIGH (ref 1.9–3.7)
Glucose, Bld: 117 mg/dL — ABNORMAL HIGH (ref 65–99)
Potassium: 4.6 mmol/L (ref 3.5–5.3)
Sodium: 143 mmol/L (ref 135–146)
Total Bilirubin: 0.3 mg/dL (ref 0.2–1.2)
Total Protein: 7.6 g/dL (ref 6.1–8.1)

## 2019-03-12 LAB — MOLECULAR ANCILLARY ONLY
Chlamydia: NEGATIVE
Comment: NEGATIVE
Comment: NORMAL
Neisseria Gonorrhea: NEGATIVE

## 2019-03-15 ENCOUNTER — Encounter: Admit: 2019-03-15 | Discharge: 2019-03-16 | Payer: BLUE CROSS/BLUE SHIELD

## 2019-03-15 DIAGNOSIS — Z79899 Other long term (current) drug therapy: Secondary | ICD-10-CM | POA: Diagnosis not present

## 2019-03-15 DIAGNOSIS — L732 Hidradenitis suppurativa: Secondary | ICD-10-CM | POA: Diagnosis not present

## 2019-03-15 DIAGNOSIS — L910 Hypertrophic scar: Secondary | ICD-10-CM | POA: Diagnosis not present

## 2019-03-15 DIAGNOSIS — Z7689 Persons encountering health services in other specified circumstances: Secondary | ICD-10-CM | POA: Diagnosis not present

## 2019-03-15 NOTE — Unmapped (Signed)
ASSESSMENT/PLAN:    Severe Hurley 3 HS scrotal, inguinal and perirenal; improved on Humira, but flared with stopping metronidazole, in patient w/ hx of well controlled HIV  -We discussed the typical natural history, pathogenesis, treatment options, and expected course as well as the relapsing and sometimes recalcitrant nature of the disease.    -Continues to see marked improvement with Humira and metronidazole 250 mg TID which he is tolerating well  - Continue Humira 40mg  [place] weekly    - Continue metronidazole 250 mg TID. Counseled regarding risk of peripheral neuropathy with long-term use.  -After the patient was informed of risks, benefits and side effects of intralesional gentamicin injection, the patient elected to undergo injection. Informed verbal consent was obtained. Risk of atrophy  and dyspigmentation with injection was explained. Gentamycin 10mg /ml was injected (mostly instilled in to opening of sinus) locally into the sites located scrotum in a clean fashion following alcohol prep.   Total volume in ml=1.  Number of sites treated: 2   Wound care was explained to the patient     High Risk Medication Use  -Quant gold negative 05/05/2018    Hx well controlled HIV; ID physician Dr. Lilli Light aware and agrees with Humira    RTC: 2 week wound check, also 2 month followup  SUBJECTIVE:    CC: Hidradenitis Suppurativa    Dustin Prince is a 58 y.o. male last seen by Dr. Janyth Contes 2 weeks ago here for HS in groin. SIte of unroofing in pubic area has healed very well.  Currently on Humira 40mg  [place] weekly which helps greatly as well as metronidazole 250 mg TID restarted 09/04/2018. He previously flared after stopping the metronidazole. Scrotum still has some open areas that drain constantly, though much better on current meds.  He has gentamicin vials and considering intralesional treatment today.  History of well-controlled HIV antiretroviral on ART.  Sees Dr. Ninetta Lights in [place].    Self-reported severity (0-5): 2 VAS pain today: 2  VAS average pain for the last month: not answered  Requiring pain medication? No.  If so, what type/frequency?   How often in pain?  few times a month  Level of odor (0-5): 4  Level of itching (0-5): 2  Dressing changes needed for drainage:Once a week  How much drainage: a little drainage  Flare in the last month (Y/N)? No.  How long ago was the last flare? more than one year ago  Developing new lesions? less than monthly  Number of inflammatory lesions montly: 1-3  DLQI: -  Current treatment:  Humira  Metronidazole  How helpful is the current treatment in managing the following aspects of your disease?  Not at all helpful Somewhat helpful Very helpful   Pain ?? ?? x   Decreasing length of flares ?? x ??   Decreasing new lesions ?? ?? x   Drainage ?? x ??   Decreasing frequency of flares ?? ?? x   Decreasing severity of flares ?? ?? x   Odor x ?? ??   ??        Social History:  Current or former smoker? never  Amount smoking: n/a  How many years: n/a  ED visits in the last 5 years? 6-10  Difficulty affording medications? most of the time  Marital Status: in relationship  Living with some one? Yes.    Prior treatments:  Topical: topical ABX  Systemic: clidnamycin; doxy (not very helpful),   Past surgical procedures: large WLE left axilla c/b dehisence  Past laser procedures: none      ROS: the balance of 10 systems is negative unless otherwise documented      OBJECTIVE:   Gen: Well-appearing patient, appropriate, interactive, in no acute distress  Skin: Examination of the scalp, face, neck, chest, back, abdomen, bilateral upper and lower extremities, hands, palms, soles, nails, buttocks, and external genitalia performed today and pertinent for:   -Cribriform scarring of scrotum, inguinal creases, and pubic area. Larger non-draining sinus of right scrotum/perineum.  -L inguinal 1 IN, 1 draining sinus 4-5cm  -perineum/scrotum with 7cm draining sinus, 1 IN -All other areas not specifically commented on are within normal limits

## 2019-03-18 DIAGNOSIS — I129 Hypertensive chronic kidney disease with stage 1 through stage 4 chronic kidney disease, or unspecified chronic kidney disease: Secondary | ICD-10-CM | POA: Diagnosis not present

## 2019-03-18 NOTE — Unmapped (Signed)
[facility] Refill Coordination Note    Specialty Medication(s) to be Shipped:   Inflammatory Disorders: Humira    Other medication(s) to be shipped: n/a     Dustin Prince, DOB: Jun 04, 1960  Phone: [phone] (home)       All above HIPAA information was verified with patient.     Was a Nurse, learning disability used for this call? No    Completed refill call assessment today to schedule patient's medication shipment from the [facility] [phone]).       Specialty medication(s) and dose(s) confirmed: Regimen is correct and unchanged.   Changes to medications: Dustin Prince reports no changes at this time.  Changes to insurance: No  Questions for the pharmacist: No    Confirmed patient received Welcome Packet with first shipment. The patient will receive a drug information handout for each medication shipped and additional FDA Medication Guides as required.       DISEASE/MEDICATION-SPECIFIC INFORMATION        For patients on injectable medications: Patient currently has 1 doses left.  Next injection is scheduled for 03/23/2019.    SPECIALTY MEDICATION ADHERENCE     Medication Adherence    Patient reported X missed doses in the last month: 0  Specialty Medication: Humira cf 40 mg/0.4 ml  Patient is on additional specialty medications: No  Any gaps in refill history greater than 2 weeks in the last 3 months: no  Demonstrates understanding of importance of adherence: yes  Informant: patient  Reliability of informant: reliable  Support network for adherence: family member  Confirmed plan for next specialty medication refill: delivery by pharmacy  Refills needed for supportive medications: not needed                Humira cf 40 mg/0.4 ml. 7 days on ahnd      SHIPPING     Shipping address confirmed in Epic.     Delivery Scheduled: Yes, Expected medication delivery date: 03/23/2019.     Medication will be delivered via UPS to the prescription address in Epic WAM.    [name]

## 2019-03-19 NOTE — Unmapped (Signed)
Glenice Laine, RN Case Manager from [facility] [place] left VM regarding Dustin Prince with medication questions. Wanted to clarify gentamycin injection, Humira, and metronidazole. Called the number back, but it went to a couple named the [place]. Unable to find another way to reach [place] at this time.

## 2019-03-22 MED FILL — HUMIRA PEN CITRATE FREE 40 MG/0.4 ML: 28 days supply | Qty: 4 | Fill #11 | Status: AC

## 2019-03-22 MED FILL — HUMIRA PEN CITRATE FREE 40 MG/0.4 ML: 28 days supply | Qty: 4 | Fill #11

## 2019-03-24 ENCOUNTER — Encounter: Payer: Self-pay | Admitting: Internal Medicine

## 2019-03-24 ENCOUNTER — Ambulatory Visit: Payer: Medicaid Other | Admitting: Internal Medicine

## 2019-03-24 ENCOUNTER — Other Ambulatory Visit: Payer: Self-pay

## 2019-03-24 VITALS — BP 145/64 | HR 61 | Temp 98.1°F | Wt 345.0 lb

## 2019-03-24 DIAGNOSIS — H5711 Ocular pain, right eye: Secondary | ICD-10-CM

## 2019-03-24 NOTE — Patient Instructions (Addendum)
[name],   Continue using warm compresses in your eye until the swelling goes down.   You can use artifical tears for the dryness in your eye.   It should be getting better in about 1 week. Let us know if it does not.   - Dr. Frederico Hamman

## 2019-03-24 NOTE — Assessment & Plan Note (Addendum)
Patient presents with a 2-day history of right eye pain associated with upper eyelid swelling and some mild conjunctival injection.  He has been using warm compresses with a lot of improvement in pain and swelling.  He has some residual swelling today but is no longer experiencing pain.  Denies foreign body sensation or changes in vision.  Eye exam was unremarkable.  Discussed this is likely a hordeolum that has improved with the warm compresses.  No signs and symptoms suggestive of acute angle closure glaucoma.  Advised patient to continue using them for the residual swelling.  Also advised use of artificial tears for eye dryness.

## 2019-03-24 NOTE — Progress Notes (Signed)
   CC: R eye pain   HPI:  Allen Espinoza is a 58 y.o. year-old male with PMH listed below who presents to clinic for right eye pain. Please see problem based assessment and plan for further details.   Past Medical History:  Diagnosis Date  . Abscess    Between legs  . Arthritis    all over  . Avascular necrosis of femoral head, left ([place])   . Cholelithiasis   . CKD (chronic kidney disease) stage 3, GFR 30-59 ml/min    sees kidney Dr.  . Diverticulosis   . DVT (deep venous thrombosis) (HCC)    legs  . Dyspnea    when walking  . Dysrhythmia    remembers mother taking about having an irregular rhythm years when he was a child   . GERD (gastroesophageal reflux disease)   . HIV (human immunodeficiency virus infection) ([place])   . Morbid obesity ([place])    Review of Systems:   Review of Systems  Constitutional: Negative for chills, fever and malaise/fatigue.  Eyes: Positive for redness. Negative for blurred vision, double vision, photophobia, pain and discharge.     Physical Exam:  Vitals:   03/24/19 0847  BP: (!) 145/64  Pulse: 61  Temp: 98.1 F (36.7 C)  TempSrc: Oral  SpO2: 99%  Weight: (!) 345 lb (156.5 kg)    General: Well-appearing male in no acute distress Eyes: R eyelid mildly swollen, mild conjunctival injection medially, dryness noted. No discharge noted. PERRL.    Assessment & Plan:   See Encounters Tab for problem based charting.  Patient discussed with Dr. Daryll Drown

## 2019-03-28 NOTE — Progress Notes (Signed)
Internal Medicine Clinic Attending  Case discussed with Dr. Santos-Sanchez soon after the resident saw the patient.  We reviewed the resident's history and exam and pertinent patient test results.  I agree with the assessment, diagnosis, and plan of care documented in the resident's note.   

## 2019-04-05 ENCOUNTER — Telehealth: Payer: Self-pay | Admitting: Internal Medicine

## 2019-04-05 NOTE — Telephone Encounter (Signed)
Rtc, went over problem list for pt, ended call

## 2019-04-05 NOTE — Telephone Encounter (Signed)
[facility] RN requesting to confirm patient has diabetes as patient states he is borderline.  Please call back.

## 2019-04-12 ENCOUNTER — Encounter: Admit: 2019-04-12 | Discharge: 2019-04-13 | Payer: BLUE CROSS/BLUE SHIELD

## 2019-04-12 ENCOUNTER — Other Ambulatory Visit: Payer: Self-pay | Admitting: Infectious Diseases

## 2019-04-12 ENCOUNTER — Encounter: Payer: Medicaid Other | Admitting: Infectious Diseases

## 2019-04-12 DIAGNOSIS — B20 Human immunodeficiency virus [HIV] disease: Secondary | ICD-10-CM

## 2019-04-12 DIAGNOSIS — L732 Hidradenitis suppurativa: Secondary | ICD-10-CM | POA: Diagnosis not present

## 2019-04-12 DIAGNOSIS — L910 Hypertrophic scar: Secondary | ICD-10-CM | POA: Diagnosis not present

## 2019-04-12 DIAGNOSIS — Z79899 Other long term (current) drug therapy: Secondary | ICD-10-CM | POA: Diagnosis not present

## 2019-04-12 DIAGNOSIS — D179 Benign lipomatous neoplasm, unspecified: Secondary | ICD-10-CM | POA: Diagnosis not present

## 2019-04-12 NOTE — Unmapped (Signed)
Hidradentis Suppurativa (pronounced ???high-drad-en-eye-tis/sup-your-uh-tee-vah???) is a chronic disease of hair follicles.  The lesions occur most commonly on areas of skin-to-skin contact: under the arms (axillary area), in the groin, around the buttocks, in the region around the anus and genitals, and on the skin between and under the breasts. In women, the underarms, groin, and breast areas are most commonly affected. Men most often have HS lesions around the anus and under the arms and may also have HS at the back of the neck and behind and around the ears.    What does HS look and feel like?   The first thing that someone with HS notices is a tender, raised, red bump that looks like an under-the-skin pimple or boil. Sometimes HS lesions have two or more ???heads.???  In mild disease only an occasional boil or abscess may occur, but in more active disease there can be many new lesions every month.  Some abscesses can become larger and may open and drain pus.  Bleeding and increased odor can also occur. In severe disease, deeper abscesses develop and may connect with each other under the skin to form tunnel-like tracts (sinuses, fistulas).  These may drain constantly, or may temporarily improve and then usually begin draining again over time.  In people who have had sinus tracts for some time, scars form that feel like ropes under the skin. In the very worst cases, networks of sinus tracts can form deeper in the body, including the muscle and other tissues. Many people with severe HS have scars that can limit their ability to freely move their arms or legs, though this is very unlikely for most patients.     Clinicians usually classify or ???grade??? HS using the [facility] staging system according to the severity of the disease for each body location:   Charco stage I: one or more abscesses are present, but no sinus tracts have formed and no scars have developed   Doreene Adas stage II: one or more abscesses are present that resolve and recur; on sinus tract can be present and scarring is seen   Doreene Adas stage III: many abscesses and more than one sinus tract is present with extensive scars.    What causes HS?  The cause of HS is not completely understood.  It seems to be a disorder of hair follicles and often many family members are affected so genetics probably play a strong role.  Bacteria are often present and may make the disease worse, but infection does not seem to be the main cause. Hormones are also likely play a role since the condition typically starts around puberty when hair follicles under the arms and in the groin start to change.  It can sometimes flare with menstrual cycles in women as well.  In most cases it lasts for decades and starts to improve to some extent in the late 30s and 40s as long as many fistulas have not already formed.  Women are three times more likely than men to develop HS.    Other factors are known to contribute to HS flaring or becoming worse, though they are likely not the main causes. The factors most commonly associated with HS include:   Cigarette smoking - this is very highly linked.  Stopping smoking will likely not cure the disease, but likely is helpful in reducing how much and how often it flares.   Obesity - HS may occur even in people that are not overweight, but it is much more common  in patients that are.  There is some evidence that losing weight and eating a diet low in sugars and fats may be helpful in improving hidradenitis, though this is not helpful for everyone.  Working with a nutritionist may be an important way to help with this and is something your physician can help coordinate    Hidradenitis is not contagious.  It is not caused by a problem with personal hygiene or any other activity or behavior of those with the disease.    How can your doctor help you treat your hidradenitis?  Clinicians use both medication and surgery to treat HS. The choice of treatment--or combination of treatments--is made according to an individual patient???s needs. Clinicians consider several factors in determining the most appropriate plan for therapy:   Severity of disease - medications and some laser treatments are usually able to control disease best when fistulas are not present.  Fistulas typically require surgery.   Extent and location of disease   Chronicity (how often the lesions recur)    A number of different surgical methods have been developed that are useful for certain patients under particular circumstances. These can be done with local numbing and healing at home for some areas when disease is not too extensive with relatively brief recovery times.  In more extensive disease there may be a need for larger excisions under general anesthesia with healing time in the hospital and prolonged recovery periods for better disease control.      In addition, many medical treatments have been tried--some with more success than others. No medication is effective for all patients, and you and your doctor may have to try several different agents or combinations of agents before you find the treatment plan that works best for you.  The goals of therapy with medications that are either topical (used on the skin) or systemic (taken by mouth) are:  1. to clear the lesions or at least reduce their number and extent, and  2. to prevent new lesions from forming.  3. To reduce pain, drainage, and odor  Some of the types of medications commonly used are antibacterial skin washes and the topical antibiotics to prevent secondary infections and corticosteroid injections into the lesions to reduce inflammation.     Other medications that may be used include retinoids (similar to Accutane), drugs that effect how hormones and hair follicles interact, drugs that affect your immune system (such as methotrexate, adalimumab/Humira, and Remicaid/infliximab), steroids, and oral antibiotics.    Lasers that destroy hair follicles can also be helpful since they reduce the hair follicles that cause the problems.  Multiple treatments are typically required over time and there is some discomfort associated with treatment, but it is typically very fast and well-tolerated.    It is very important to realize that hidradenitis cannot be completely cured with any single medication or surgical procedure.  It is a disease that can be very stubborn and difficult to control, but with good treatment a lot of improvement and sometimes temporary remissions can be obtained. Poorly controlled disease can cause more fistulas to form and make managing the disease much more difficult over time so it is important to seek care to reduce major flares.  Surgery can provide a long term cure in some areas, though the disease can start again or continue in nearby areas.  A dermatologist is often the best person to help coordinate disease treatment, and sometimes other surgeons, pain specialists, other specialists, and nutritionists may be  part of the treatment team.    What can you do to help your HS?  1. If your are a smoker, then stopping can probably be helpful.  Your dermatologist will be happy to refer you to some one who can help with this.  2. Follow a healthy diet and try to achieve a healthy weight  Some other self-help measures are:   Keep your skin cool and dry (becoming overheated and sweating can contribute to an HS flare)   To reduce the pain of cysts or nodules or to help them to drain, apply hot compresses or soak in hot water for 10 minutes at a time (use a clean washcloth or a teabag soaked in hot water)   For male patients, cotton underwear that does not have tight elastic in the groin can be helpful.  Boyshort, brief, or boxer style underwear may be a better option as friction on hair follicles in affected areas can be a major trigger in some patients.  These can be easily found on [place] or with some retailers.  Fruit of the Loom and Underworks are two brands that are sometimes recommended.    Finally, know that you are not alone. Coping with the pain and other symptoms of HS can be very difficult, so it may be helpful to connect with others who live with HS. Patient groups and networks can be sources of important information and support. Some internet resources for information and connections are provided below.  Resources for Information    The Hidradenitis Suppurativa Foundation: A nonprofit organized by a group of physicians interested in treating and advancing research in hidradenitis suppurativa    American Academy of Dermatology  ARanked.fi    [employer]  ElevatorPitchers.de.html  [employer]ORD: [employer]  https://www.rarediseases.org/rare-disease-information/rare-diseases/byID/358/viewAbstract  Trials of new medications for HS  https://www.clinicaltrials.gov

## 2019-04-12 NOTE — Unmapped (Signed)
ASSESSMENT/PLAN:    Severe [place] 3 HS scrotal, inguinal and perirenal; improved on Humira, but flared with stopping metronidazole, in patient w/ hx of well controlled HIV  -We discussed the typical natural history, pathogenesis, treatment options, and expected course as well as the relapsing and sometimes recalcitrant nature of the disease.    -Continues to see marked improvement with Humira and metronidazole 250 mg TID which he is tolerating well  - Continue Humira 40mg  [place] weekly    - Continue metronidazole 250 mg TID. Counseled regarding risk of peripheral neuropathy with long-term use.  -After the patient was informed of risks, benefits and side effects of intralesional gentamicin injection, the patient elected to undergo injection. Informed verbal consent was obtained. Risk of atrophy  and dyspigmentation with injection was explained. Gentamycin 10mg /ml was injected (mostly instilled in to opening of sinus) locally into the sites located scrotum in a clean fashion following alcohol prep.   Total volume in ml=0.8.  Number of sites treated: 3   Wound care was explained to the patient     High Risk Medication Use  -Quant gold negative 05/05/2018    Nodules on abdomen, forearm, leg are most likely lipomas, but given multiple and enlarging will do excision of one in the next couple of months    Hx well controlled HIV; ID physician Dr. Lilli Light aware and agrees with Humira    RTC: 2 week wound check, also 2 month followup  SUBJECTIVE:    CC: Hidradenitis Suppurativa Dustin Prince is a 59 y.o. male last seen by Dr. Janyth Contes 2 weeks ago here for HS in groin. SIte of unroofing in pubic area has healed very well previously.  Trid to instill a small amount of gentamicin solution within a tunneled area in the groin at last vist, but minimal change seen at this point.  Currently on Humira 40mg  Silex weekly which helps greatly as well as metronidazole 250 mg TID restarted 09/04/2018. He previously flared after stopping the metronidazole. Scrotum still has some open areas that drain constantly, though much better on current meds.  He has gentamicin vials and considering intralesional treatment today.  ALso notes enlarging nodules on the R forearm, abdomen, and L thigh for several months, asymptomatic.  History of well-controlled HIV antiretroviral on ART.  Sees Dr. Ninetta Lights in [place].    Self-reported severity (0-5): 2  VAS pain today: 2  VAS average pain for the last month: not answered  Requiring pain medication? No.  If so, what type/frequency?   How often in pain?  few times a month  Level of odor (0-5): 4  Level of itching (0-5): 2  Dressing changes needed for drainage:Once a week  How much drainage: a little drainage  Flare in the last month (Y/N)? No.  How long ago was the last flare? more than one year ago  Developing new lesions? less than monthly  Number of inflammatory lesions montly: 1-3  DLQI: -  Current treatment:  Humira  Metronidazole  How helpful is the current treatment in managing the following aspects of your disease?  Not at all helpful Somewhat helpful Very helpful   Pain ?? ?? x   Decreasing length of flares ?? x ??   Decreasing new lesions ?? ?? x   Drainage ?? x ??   Decreasing frequency of flares ?? ?? x   Decreasing severity of flares ?? ?? x   Odor x ?? ??   ??        Social  History:  Current or former smoker? never  Amount smoking: n/a  How many years: n/a  ED visits in the last 5 years? 6-10  Difficulty affording medications? most of the time  Marital Status: in relationship Living with some one? Yes.    Prior treatments:  Topical: topical ABX  Systemic: clidnamycin; doxy (not very helpful),   Past surgical procedures: large WLE left axilla c/b dehisence   Past laser procedures: none      ROS: the balance of 10 systems is negative unless otherwise documented      OBJECTIVE:   Gen: Well-appearing patient, appropriate, interactive, in no acute distress  Skin: Examination of the scalp, face, neck, chest, back, abdomen, bilateral upper and lower extremities, hands, palms, soles, nails, buttocks, and external genitalia performed today and pertinent for:   -Cribriform scarring of scrotum, inguinal creases, and pubic area. Larger non-draining sinus of right scrotum/perineum.  -L inguinal 1 IN, 1 draining sinus 4-5cm  -perineum/scrotum with 7cm non-draining sinus, 1 IN  -All other areas not specifically commented on are within normal limits

## 2019-04-13 NOTE — Unmapped (Signed)
-----   Message from [name], MD sent at 04/12/2019  7:41 PM EST -----  Regarding: surgery  Please set up for excision, scalp, trunk, arms, legs, axillae: 2.6 cm -7.5  [id number], arms, legs: 3.1- 4 cm  [id number].  One hour sovi or standard hmob

## 2019-04-14 ENCOUNTER — Other Ambulatory Visit: Payer: Self-pay

## 2019-04-14 ENCOUNTER — Encounter: Payer: Self-pay | Admitting: Infectious Diseases

## 2019-04-14 ENCOUNTER — Telehealth (INDEPENDENT_AMBULATORY_CARE_PROVIDER_SITE_OTHER): Payer: Medicaid Other | Admitting: Infectious Diseases

## 2019-04-14 DIAGNOSIS — L732 Hidradenitis suppurativa: Secondary | ICD-10-CM

## 2019-04-14 DIAGNOSIS — Z113 Encounter for screening for infections with a predominantly sexual mode of transmission: Secondary | ICD-10-CM

## 2019-04-14 DIAGNOSIS — N183 Chronic kidney disease, stage 3 unspecified: Secondary | ICD-10-CM | POA: Diagnosis not present

## 2019-04-14 DIAGNOSIS — Z79899 Other long term (current) drug therapy: Secondary | ICD-10-CM

## 2019-04-14 DIAGNOSIS — B20 Human immunodeficiency virus [HIV] disease: Secondary | ICD-10-CM

## 2019-04-14 NOTE — Progress Notes (Signed)
Name: Allen Espinoza  DOB: 08-19-1960 MRN: [id number] PCP: Welford Roche, MD   Virtual Visit via [place] connected with Allen Espinoza on 04/20/19 at  4:15 PM EST by MyChart Video Portal and verified that I am speaking with the correct person using two identifiers.   I discussed the limitations, risks, security and privacy concerns of performing an evaluation and management service by telephone and the availability of in person appointments. I also discussed with the patient that there may be a patient responsible charge related to this service. The patient expressed understanding and agreed to proceed.  Patient Location: home Provider Location: [facility]  Patient Active Problem List   Diagnosis Date Noted  . Acute right eye pain 03/24/2019  . Subcutaneous nodules 03/01/2019  . Essential hypertension 10/19/2018  . Sleep disorder 03/05/2018  . 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 Morbid (severe) obesity due to excess calories ([place]) 08/19/2016  . Solitary pulmonary nodule 05/06/2016  . Hidradenitis suppurativa 04/27/2016  . Chest pain 04/26/2016  . Avascular necrosis of bone of hip, left s/p TRH ([place]) 03/19/2016  . Human immunodeficiency virus (HIV) disease ([place]) 03/18/2016  . Chronic kidney disease 03/18/2016  . Cholelithiasis 03/18/2016  . Diverticulosis of colon without hemorrhage 03/18/2016     Brief Narrative:  Allen Espinoza is a 59 y.o. male with HIV infection. Originally diagnosed in 2007 in [place].  History of OIs: none.  HIV Risk: heterosexual   07/2016 Quantiferon (-)  Previous Regimens: Jorje Guild + Prezista >> suppressed (changed due to DDI with Xarelto) . Biktarvy >> suppressed   Genotypes: . No mutations   Subjective:  CC: Follow up on HIV care. No complaints. Wants to review labs.   HPI/ROS:  Still working with [facility] dermatology getting Humira and other intradermal injections  with overall good result in control of his hidradenitis suppurativa.  He has been doing well taking his Biktarvy every day and has not missed any doses.  He has no trouble with side effects or accessibility to his medications.  He has been having some dyspnea and and chest pain and awaiting another cardiology evaluation.  He had seen them in the past.  He is on chronic anticoagulation with Xarelto and IVC filter for history of a DVT.  Review of Systems  Constitutional: Negative for appetite change, chills, fatigue, fever and unexpected weight change.  Eyes: Negative for visual disturbance.  Respiratory: Positive for shortness of breath. Negative for cough.   Cardiovascular: Positive for chest pain. Negative for leg swelling.  Gastrointestinal: Negative for abdominal pain, diarrhea and nausea.  Genitourinary: Negative for discharge, dysuria and genital sores.  Musculoskeletal: Negative for joint swelling.  Skin: Negative for color change and rash.  Neurological: Negative for dizziness and headaches.  Hematological: Negative for adenopathy.  Psychiatric/Behavioral: Negative for sleep disturbance. The patient is not nervous/anxious.      Past Medical History:  Diagnosis Date  . Abscess    Between legs  . Arthritis    all over  . Avascular necrosis of femoral head, left ([place])   . Cholelithiasis   . CKD (chronic kidney disease) stage 3, GFR 30-59 ml/min    sees kidney Dr.  . Diverticulosis   . DVT (deep venous thrombosis) (HCC)    legs  . Dyspnea    when walking  . Dysrhythmia    remembers mother taking about having an irregular rhythm years when he  was a child   . GERD (gastroesophageal reflux disease)   . HIV (human immunodeficiency virus infection) ([place])   . Morbid obesity ([place])     Outpatient Medications Prior to Visit  Medication Sig Dispense Refill  . albuterol (PROVENTIL HFA;VENTOLIN HFA) 108 (90 Base) MCG/ACT inhaler Inhale 2 puffs into the lungs daily as needed for  wheezing or shortness of breath.    Marland Kitchen BIKTARVY 50-200-25 MG TABS tablet Take 1 tablet by mouth once daily 240 tablet 0  . Cholecalciferol (VITAMIN D) 50 MCG (2000 UT) CAPS Take 1 capsule by mouth.    . furosemide (LASIX) 20 MG tablet Take 20 mg by mouth daily.  6  . vitamin B-12 (CYANOCOBALAMIN) 100 MCG tablet Take 100 mcg by mouth daily.    Alveda Reasons 20 MG TABS tablet TAKE 1 TABLET BY MOUTH ONCE DAILY WITH  SUPPER 90 tablet 1  . Adalimumab 40 MG/0.4ML PNKT INJECT THE CONTENTS OF 1 PEN (40MG ) UNDER THE SKIN ONCE EACH WEEK    . metroNIDAZOLE (FLAGYL) 250 MG tablet Take 250 mg by mouth 3 (three) times a week.     No facility-administered medications prior to visit.     Allergies  Allergen Reactions  . Sulfa Antibiotics Other (See Comments)    Low blood pressure  . Vancomycin Itching    Give with benadryl    Social History   Tobacco Use  . Smoking status: Never Smoker  . Smokeless tobacco: Never Used  Substance Use Topics  . Alcohol use: No    Comment: prior  . Drug use: No    Comment: prior cocaine use, last 2005    Social History   Substance and Sexual Activity  Sexual Activity Yes  . Partners: Female  . Birth control/protection: Condom     Objective:   There were no vitals filed for this visit. There is no height or weight on file to calculate BMI.  Physical Exam Constitutional:      Appearance: Normal appearance. He is not ill-appearing.  HENT:     Head: Normocephalic.     Mouth/Throat:     Mouth: Mucous membranes are moist.     Pharynx: Oropharynx is clear.  Eyes:     General: No scleral icterus. Pulmonary:     Effort: Pulmonary effort is normal.  Musculoskeletal:        General: Normal range of motion.     Cervical back: Normal range of motion.  Skin:    Coloration: Skin is not jaundiced or pale.  Neurological:     Mental Status: He is alert and oriented to person, place, and time.  Psychiatric:        Mood and Affect: Mood normal.         Judgment: Judgment normal.     Lab Results Lab Results  Component Value Date   WBC 6.4 03/03/2019   HGB 14.9 03/03/2019   HCT 43.6 03/03/2019   MCV 101.4 (H) 03/03/2019   PLT 223 03/03/2019    Lab Results  Component Value Date   CREATININE 1.48 (H) 03/03/2019   BUN 13 03/03/2019   NA 143 03/03/2019   K 4.6 03/03/2019   CL 106 03/03/2019   CO2 29 03/03/2019    Lab Results  Component Value Date   ALT 12 03/03/2019   AST 15 03/03/2019   ALKPHOS 83 06/16/2018   BILITOT 0.3 03/03/2019    Lab Results  Component Value Date   CHOL 162 08/18/2018   HDL  60 08/18/2018   LDLCALC 81 08/18/2018   TRIG 114 08/18/2018   CHOLHDL 2.7 08/18/2018   HIV 1 RNA Quant (copies/mL)  Date Value  03/03/2019 <20 NOT DETECTED  08/18/2018 <20 NOT DETECTED  02/04/2018 <20 DETECTED (A)   HIV-1 RNA Viral Load (copies/mL)  Date Value  03/18/2016 47,300   CD4 T Cell Abs (/uL)  Date Value  03/03/2019 401  08/18/2018 300 (L)  02/04/2018 360 (L)    Assessment & Plan:   Problem List Items Addressed This Visit      Unprioritized   Human immunodeficiency virus (HIV) disease ([place]) - Primary (Chronic)    Nicolae has had very well-controlled HIV disease on current regimen of Biktarvy.  He actually has had an improvement in overall CD4 count reconstitution since initiating monoclonal treatment for his skin condition with most recent viral load less than 20 copies and CD4 count in the normal range at 400.       Hidradenitis suppurativa    Doing well on Humira and other maintenance medications.  Follows with [facility] dermatology team.      Chronic kidney disease    Lab Results  Component Value Date   CREATININE 1.48 (H) 03/03/2019   CREATININE 1.47 (H) 06/16/2018   CREATININE 1.72 (H) 02/04/2018   Stable to improved.  We will continue to monitor but no change in HAART is necessary at this time.  He may be a good candidate for Dovato in the future to spare him of tenofovir use.        Can  return in 6 months with lab work and another video visit.  Janene Madeira, MSN, NP-C [facility] [place] Pager: [phone] Office: [phone]  04/20/19  2:36 PM

## 2019-04-15 DIAGNOSIS — N183 Chronic kidney disease, stage 3 unspecified: Secondary | ICD-10-CM | POA: Diagnosis not present

## 2019-04-15 DIAGNOSIS — N189 Chronic kidney disease, unspecified: Secondary | ICD-10-CM | POA: Diagnosis not present

## 2019-04-15 MED ORDER — HUMIRA PEN CITRATE FREE 40 MG/0.4 ML
11 refills | 0 days | Status: CP
Start: 2019-04-15 — End: 2020-04-14
  Filled 2019-04-21: qty 4, 28d supply, fill #0

## 2019-04-15 NOTE — Progress Notes (Signed)
Cardiology Office Note    Date:  04/20/2019   ID:  Allen Espinoza, DOB Aug 13, 1960, MRN [id number]  PCP:  Welford Roche, MD  Cardiologist: Lauree Chandler, MD EPS: None  Chief Complaint  Patient presents with  . Follow-up    History of Present Illness:  Allen Espinoza is a 59 y.o. male with history of chronic kidney disease, DVT, GERD, HIV and morbid obesity, history of DVT on Xarelto and IVC filter in place.Echo 09/2016 normal LVEF 60-65% mod LVH, grade 1 DD   Patient saw Dr. Angelena Form 01/2018 with atypical chest pain. If symptoms changed he recommended NST.  Patient is referred back by PCP for dyspnea on exertion and exertional chest pain into left arm.  Patient complains of left sided chest pain and shortness of breath after he eats or drinks something.Drinks kool aid and soda that seem to bother him more. If he belches or passes gas it seems to release it. No dysphagia or reflux symptoms. Lasts 5-10 min. Also helps to raise his left arm. Says it's the same pain as when he saw Dr. Angelena Form Has shortness of breath going up steps but no chest pain. No regular exercise. Has gained 100 lbs since 2017. Started on a BP med by kidney specialist. Echo 03/2019 normal LVEF but elevated PASP so started on lasix by PCP.    Past Medical History:  Diagnosis Date  . Abscess    Between legs  . Arthritis    all over  . Avascular necrosis of femoral head, left ([place])   . Cholelithiasis   . CKD (chronic kidney disease) stage 3, GFR 30-59 ml/min    sees kidney Dr.  . Diverticulosis   . DVT (deep venous thrombosis) (HCC)    legs  . Dyspnea    when walking  . Dysrhythmia    remembers mother taking about having an irregular rhythm years when he was a child   . GERD (gastroesophageal reflux disease)   . HIV (human immunodeficiency virus infection) ([place])   . Morbid obesity ([place])     Past Surgical History:  Procedure Laterality Date  . DENTAL SURGERY     had teeth pulled  .  HYDRADENITIS EXCISION Left 10/14/2016   Procedure: WIDE EXCISION HIDRADENITIS LEFT AXILLA;  Surgeon: Coralie Keens, MD;  Location: [facility];  Service: General;  Laterality: Left;  . JOINT REPLACEMENT     Left hip Dr. Ninfa Linden 11/29/16  . TOTAL HIP ARTHROPLASTY Left 11/29/2016   Procedure: LEFT TOTAL HIP ARTHROPLASTY ANTERIOR APPROACH;  Surgeon: [name], MD;  [facility];  Service: Orthopedics;  Laterality: Left;    Current Medications: Current Meds  Medication Sig  . Adalimumab 40 MG/0.4ML PNKT INJECT THE CONTENTS OF 1 PEN (40MG ) UNDER THE SKIN ONCE EACH WEEK  . albuterol (PROVENTIL HFA;VENTOLIN HFA) 108 (90 Base) MCG/ACT inhaler Inhale 2 puffs into the lungs daily as needed for wheezing or shortness of breath.  Marland Kitchen BIKTARVY 50-200-25 MG TABS tablet Take 1 tablet by mouth once daily  . Cholecalciferol (VITAMIN D) 50 MCG (2000 UT) CAPS Take 1 capsule by mouth.  . furosemide (LASIX) 20 MG tablet Take 20 mg by mouth daily.  Marland Kitchen losartan (COZAAR) 50 MG tablet Take 50 mg by mouth daily.  . vitamin B-12 (CYANOCOBALAMIN) 100 MCG tablet Take 100 mcg by mouth daily.  Alveda Reasons 20 MG TABS tablet TAKE 1 TABLET BY MOUTH ONCE DAILY WITH  SUPPER     Allergies:   Sulfa antibiotics  and Vancomycin   Social History   Socioeconomic History  . Marital status: Single    Spouse name: Not on file  . Number of children: 2  . Years of education: Not on file  . Highest education level: Not on file  Occupational History  . Occupation: Disability  Tobacco Use  . Smoking status: Never Smoker  . Smokeless tobacco: Never Used  Substance and Sexual Activity  . Alcohol use: No    Comment: prior  . Drug use: No    Comment: prior cocaine use, last 2005  . Sexual activity: Yes    Partners: Female    Birth control/protection: Condom  Other Topics Concern  . Not on file  Social History Narrative  . Not on file   Social Determinants of Health   Financial Resource Strain:   . Difficulty of  Paying Living Expenses: Not on file  Food Insecurity:   . Worried About Charity fundraiser in the Last Year: Not on file  . Ran Out of Food in the Last Year: Not on file  Transportation Needs:   . Lack of Transportation (Medical): Not on file  . Lack of Transportation (Non-Medical): Not on file  Physical Activity:   . Days of Exercise per Week: Not on file  . Minutes of Exercise per Session: Not on file  Stress:   . Feeling of Stress : Not on file  Social Connections:   . Frequency of Communication with Friends and Family: Not on file  . Frequency of Social Gatherings with Friends and Family: Not on file  . Attends Religious Services: Not on file  . Active Member of Clubs or Organizations: Not on file  . Attends Archivist Meetings: Not on file  . Marital Status: Not on file     Family History:  The patient's   family history includes Bone cancer in his maternal aunt; Breast cancer in his maternal aunt; Heart attack (age of onset: 60) in his father and mother; Prostate cancer in his brother.   ROS:   Please see the history of present illness.    ROS All other systems reviewed and are negative.   PHYSICAL EXAM:   VS:  BP 120/72   Pulse (!) 51   Ht 6' (1.829 m)   Wt (!) 345 lb (156.5 kg)   SpO2 99%   BMI 46.79 kg/m   Physical Exam  GEN: Obese, in no acute distress  Neck: no JVD, carotid bruits, or masses Cardiac:RRR; distant HS,no murmurs, rubs, or gallops  Respiratory:  clear to auscultation bilaterally, normal work of breathing GI: soft, nontender, nondistended, + BS Ext: without cyanosis, clubbing, or edema, Good distal pulses bilaterally Neuro:  Alert and Oriented x 3 Psych: euthymic mood, full affect  Wt Readings from Last 3 Encounters:  04/20/19 (!) 345 lb (156.5 kg)  03/24/19 (!) 345 lb (156.5 kg)  03/01/19 (!) 346 lb 14.4 oz (157.4 kg)      Studies/Labs Reviewed:   EKG:  EKG is  ordered today.  The ekg ordered today demonstrates sinus  bradycardia 51 bpm, no acute change  Recent Labs: 03/03/2019: ALT 12; BUN 13; Creat 1.48; Hemoglobin 14.9; Platelets 223; Potassium 4.6; Sodium 143   Lipid Panel    Component Value Date/Time   CHOL 162 08/18/2018 0842   CHOL 137 03/18/2016 1539   TRIG 114 08/18/2018 0842   HDL 60 08/18/2018 0842   HDL 64 03/18/2016 1539   CHOLHDL 2.7 08/18/2018 [id number]  VLDL 16 08/07/2016 1004   LDLCALC 81 08/18/2018 0842    Additional studies/ records that were reviewed today include:  Echo July 2018: - Left ventricle: The cavity size was normal. There was moderate   concentric hypertrophy. Systolic function was normal. The   estimated ejection fraction was in the range of 60% to 65%. Wall   motion was normal; there were no regional wall motion   abnormalities. Doppler parameters are consistent with abnormal   left ventricular relaxation (grade 1 diastolic dysfunction). The   E/e&' ratio is between 8-15, suggesting indeterminate LV filling   pressure. - Left atrium: The atrium was normal in size. - Tricuspid valve: There was mild regurgitation. - Pulmonary arteries: PA peak pressure: 37 mm Hg (S). - Inferior vena cava: The vessel was normal in size. The   respirophasic diameter changes were in the normal range (>= 50%),   consistent with normal central venous pressure.   Impressions:   - LVEF 60-65%, moderate LVH, normal wall motion, grade 1 DD with   indeterminate LV filling pressure, normal LA size, mild TR, RVSP   37 mmHg, normal IVC.       ASSESSMENT:    1. Precordial pain   2. 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 Essential hypertension   4. Morbid (severe) obesity due to excess calories (HCC)      PLAN:  In order of problems listed above:  Chest pain after eating and drinking and dyspnea on exertion with going up stairs. Has gained 100 lbs in 3 years. Chest pain atypical but multiple CV risk factors. Will proceed with  lexiscan. Refer to GI for above symptoms as well. Echo 03/2019 normal LVEF with elevated PASP started on lasix  CKD followed by Dr. Arty Baumgartner Crt 1.48 03/2019  History of DVT on Xarelto and IVC filter  HTN controlled on losartan 50 mg daily  Morbid obesity-100 lb weight gain-refer to weight loss center.   GERD refer to GI    Medication Adjustments/Labs and Tests Ordered: Current medicines are reviewed at length with the patient today.  Concerns regarding medicines are outlined above.  Medication changes, Labs and Tests ordered today are listed in the Patient Instructions below. Patient Instructions  Your physician recommends that you continue on your current medications as directed. Please refer to the Current Medication list given to you today.  Your physician has requested that you have a lexiscan myoview. For further information please visit [email or web address]. Please follow instruction sheet, as given.   You have been referred to  GI DR [facility]   Your physician recommends that you schedule a follow-up appointment in:  PENDING MYOVIEW RESULTS    Signed, Ermalinda Barrios, PA-C  04/20/2019 12:00 PM    [address], [place], [place]  [address] Phone: [phone]; Fax: [phone]

## 2019-04-15 NOTE — Unmapped (Signed)
Refill request for Humira pending

## 2019-04-20 ENCOUNTER — Other Ambulatory Visit: Payer: Self-pay

## 2019-04-20 ENCOUNTER — Encounter: Payer: Self-pay | Admitting: *Deleted

## 2019-04-20 ENCOUNTER — Encounter: Payer: Self-pay | Admitting: Physician Assistant

## 2019-04-20 ENCOUNTER — Ambulatory Visit (INDEPENDENT_AMBULATORY_CARE_PROVIDER_SITE_OTHER): Payer: Medicaid Other | Admitting: Physician Assistant

## 2019-04-20 VITALS — BP 120/72 | HR 51 | Ht 72.0 in | Wt 345.0 lb

## 2019-04-20 DIAGNOSIS — I825Z2 Chronic embolism and thrombosis of unspecified deep veins of left distal lower extremity: Secondary | ICD-10-CM | POA: Diagnosis not present

## 2019-04-20 DIAGNOSIS — I10 Essential (primary) hypertension: Secondary | ICD-10-CM

## 2019-04-20 DIAGNOSIS — R072 Precordial pain: Secondary | ICD-10-CM

## 2019-04-20 DIAGNOSIS — Z7689 Persons encountering health services in other specified circumstances: Secondary | ICD-10-CM | POA: Diagnosis not present

## 2019-04-20 NOTE — Assessment & Plan Note (Signed)
Allen Espinoza has had very well-controlled HIV disease on current regimen of Biktarvy.  He actually has had an improvement in overall CD4 count reconstitution since initiating monoclonal treatment for his skin condition with most recent viral load less than 20 copies and CD4 count in the normal range at 400.

## 2019-04-20 NOTE — Assessment & Plan Note (Signed)
Doing well on Humira and other maintenance medications.  Follows with [facility] dermatology team.

## 2019-04-20 NOTE — Assessment & Plan Note (Addendum)
Lab Results  Component Value Date   CREATININE 1.48 (H) 03/03/2019   CREATININE 1.47 (H) 06/16/2018   CREATININE 1.72 (H) 02/04/2018   Stable to improved.  We will continue to monitor but no change in HAART is necessary at this time.  He may be a good candidate for Dovato in the future to spare him of tenofovir use.

## 2019-04-20 NOTE — Patient Instructions (Signed)
Your physician recommends that you continue on your current medications as directed. Please refer to the Current Medication list given to you today.  Your physician has requested that you have a lexiscan myoview. For further information please visit [email or web address]. Please follow instruction sheet, as given.   You have been referred to  GI DR [facility]   Your physician recommends that you schedule a follow-up appointment in:  PENDING MYOVIEW RESULTS

## 2019-04-20 NOTE — Unmapped (Signed)
[facility] Refill Coordination Note    Specialty Medication(s) to be Shipped:   Inflammatory Disorders: Humira    Other medication(s) to be shipped: n/a     Dustin Prince, DOB: 1960/06/10  Phone: [phone] (home)       All above HIPAA information was verified with patient.     Was a Nurse, learning disability used for this call? No    Completed refill call assessment today to schedule patient's medication shipment from the [facility] [phone]).       Specialty medication(s) and dose(s) confirmed: Regimen is correct and unchanged.   Changes to medications: Ulric reports no changes at this time.  Changes to insurance: No  Questions for the pharmacist: No    Confirmed patient received Welcome Packet with first shipment. The patient will receive a drug information handout for each medication shipped and additional FDA Medication Guides as required.       DISEASE/MEDICATION-SPECIFIC INFORMATION        For patients on injectable medications: Patient currently has 1 doses left.  Next injection is scheduled for 04/20/2019.    SPECIALTY MEDICATION ADHERENCE     Medication Adherence    Patient reported X missed doses in the last month: 0  Specialty Medication: Humira cf 40 mg/0.4 ml  Patient is on additional specialty medications: No  Any gaps in refill history greater than 2 weeks in the last 3 months: no  Demonstrates understanding of importance of adherence: yes  Informant: patient  Reliability of informant: reliable  Support network for adherence: family member  Confirmed plan for next specialty medication refill: delivery by pharmacy  Refills needed for supportive medications: not needed                Humira cf 40 mg/0.4 ml. 7 days on hand      SHIPPING     Shipping address confirmed in Epic.     Delivery Scheduled: Yes, Expected medication delivery date: 04/22/2019.     Medication will be delivered via UPS to the prescription address in Epic WAM.    Shadiyah Wernli D Charmelle Soh [facility] Specialty Technician

## 2019-04-21 MED FILL — HUMIRA PEN CITRATE FREE 40 MG/0.4 ML: 28 days supply | Qty: 4 | Fill #0 | Status: AC

## 2019-05-03 ENCOUNTER — Ambulatory Visit (HOSPITAL_COMMUNITY): Payer: Medicaid Other | Attending: Cardiovascular Disease

## 2019-05-03 ENCOUNTER — Other Ambulatory Visit: Payer: Self-pay

## 2019-05-03 DIAGNOSIS — R072 Precordial pain: Secondary | ICD-10-CM | POA: Insufficient documentation

## 2019-05-03 DIAGNOSIS — Z7689 Persons encountering health services in other specified circumstances: Secondary | ICD-10-CM | POA: Diagnosis not present

## 2019-05-03 MED ORDER — REGADENOSON 0.4 MG/5ML IV SOLN
0.4000 mg | Freq: Once | INTRAVENOUS | Status: AC
  Administered 2019-05-03: 0.4 mg via INTRAVENOUS

## 2019-05-03 MED ORDER — TECHNETIUM TC 99M TETROFOSMIN IV KIT
33.0000 | PACK | Freq: Once | INTRAVENOUS | Status: AC | PRN
  Administered 2019-05-03: 33 via INTRAVENOUS
  Filled 2019-05-03: qty 33

## 2019-05-04 ENCOUNTER — Encounter (HOSPITAL_COMMUNITY): Payer: Medicaid Other | Attending: Cardiovascular Disease

## 2019-05-04 DIAGNOSIS — Z7689 Persons encountering health services in other specified circumstances: Secondary | ICD-10-CM | POA: Diagnosis not present

## 2019-05-04 LAB — MYOCARDIAL PERFUSION IMAGING
LV dias vol: 97 mL (ref 62–150)
LV sys vol: 37 mL
Peak HR: 84 {beats}/min
Rest HR: 55 {beats}/min
SDS: 4
SRS: 0
SSS: 4
TID: 0.76

## 2019-05-04 MED ORDER — TECHNETIUM TC 99M TETROFOSMIN IV KIT
31.3000 | PACK | Freq: Once | INTRAVENOUS | Status: AC | PRN
  Administered 2019-05-04: 31.3 via INTRAVENOUS
  Filled 2019-05-04: qty 32

## 2019-05-13 DIAGNOSIS — H40033 Anatomical narrow angle, bilateral: Secondary | ICD-10-CM | POA: Diagnosis not present

## 2019-05-13 DIAGNOSIS — H40023 Open angle with borderline findings, high risk, bilateral: Secondary | ICD-10-CM | POA: Diagnosis not present

## 2019-05-13 DIAGNOSIS — H35013 Changes in retinal vascular appearance, bilateral: Secondary | ICD-10-CM | POA: Diagnosis not present

## 2019-05-13 DIAGNOSIS — Z7689 Persons encountering health services in other specified circumstances: Secondary | ICD-10-CM | POA: Diagnosis not present

## 2019-05-13 DIAGNOSIS — H35033 Hypertensive retinopathy, bilateral: Secondary | ICD-10-CM | POA: Diagnosis not present

## 2019-05-13 NOTE — Unmapped (Signed)
[facility] Refill Coordination Note    Specialty Medication(s) to be Shipped:   Inflammatory Disorders: Humira    Other medication(s) to be shipped: n/a     Dustin Prince, DOB: 1960-09-25  Phone: [phone] (home)       All above HIPAA information was verified with patient.     Was a Nurse, learning disability used for this call? No    Completed refill call assessment today to schedule patient's medication shipment from the [facility] [phone]).       Specialty medication(s) and dose(s) confirmed: Regimen is correct and unchanged.   Changes to medications: Dustin Prince reports no changes at this time.  Changes to insurance: No  Questions for the pharmacist: No    Confirmed patient received Welcome Packet with first shipment. The patient will receive a drug information handout for each medication shipped and additional FDA Medication Guides as required.       DISEASE/MEDICATION-SPECIFIC INFORMATION        For patients on injectable medications: Patient currently has 1 doses left.  Next injection is scheduled for 05/18/2019.    SPECIALTY MEDICATION ADHERENCE     Medication Adherence    Patient reported X missed doses in the last month: 0  Specialty Medication: Humira CF 40 mg/0.4 ml  Patient is on additional specialty medications: No  Any gaps in refill history greater than 2 weeks in the last 3 months: no  Demonstrates understanding of importance of adherence: yes  Informant: patient  Reliability of informant: reliable  Support network for adherence: family member  Confirmed plan for next specialty medication refill: delivery by pharmacy  Refills needed for supportive medications: not needed                Humira CF 40 mg/0.4 ml. 7 days on hand       SHIPPING     Shipping address confirmed in Epic.     Delivery Scheduled: Yes, Expected medication delivery date: 05/20/2019.     Medication will be delivered via UPS to the prescription address in Epic WAM.    Dustin Prince   [facility] Specialty Technician

## 2019-05-18 ENCOUNTER — Encounter: Payer: Self-pay | Admitting: Internal Medicine

## 2019-05-18 ENCOUNTER — Ambulatory Visit (INDEPENDENT_AMBULATORY_CARE_PROVIDER_SITE_OTHER): Payer: Medicaid Other | Admitting: Internal Medicine

## 2019-05-18 ENCOUNTER — Telehealth: Payer: Self-pay

## 2019-05-18 VITALS — BP 132/72 | HR 60 | Temp 98.5°F | Ht 72.0 in | Wt 345.0 lb

## 2019-05-18 DIAGNOSIS — Z7901 Long term (current) use of anticoagulants: Secondary | ICD-10-CM

## 2019-05-18 DIAGNOSIS — R6881 Early satiety: Secondary | ICD-10-CM

## 2019-05-18 DIAGNOSIS — R079 Chest pain, unspecified: Secondary | ICD-10-CM

## 2019-05-18 DIAGNOSIS — Z01818 Encounter for other preprocedural examination: Secondary | ICD-10-CM | POA: Diagnosis not present

## 2019-05-18 NOTE — Telephone Encounter (Signed)
Request for surgical clearance:     Endoscopy Procedure  What type of surgery is being performed?     EGD  When is this surgery scheduled?     05/25/19  What type of clearance is required ?   Pharmacy  Are there any medications that need to be held prior to surgery and how long? Xarelto, 2 days  Practice name and name of physician performing surgery?      [facility]  What is your office phone and fax number?      Phone- [phone]  [phone]  Anesthesia type (None, local, MAC, general) ?       MAC

## 2019-05-18 NOTE — Progress Notes (Signed)
Allen Espinoza 59 y.o. 05/30/1960 [name]  Assessment & Plan:   Encounter Diagnoses  Name Primary?  . Left-sided chest pain Yes  . Early satiety   . Long term current use of anticoagulant    Some features musculoskeletal but it begins with eating and drinking also affected.  Evaluate w/ EGD off Xarelto x 2 days. Keep off acid suppression until EGD and wait for better indication.  The risks and benefits as well as alternatives of endoscopic procedure(s) have been discussed and reviewed. All questions answered. The patient agrees to proceed. Extra rare risk DVT/PE off Xarelto reviewed. Think good idea to hold Xarelto in case needs polypectomy or other higher-risk treatment   I appreciate the opportunity to care for you. 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  Several weeks of Left chest pain, pressure/sharp. Occurs after eating and feels full sooner. Cannot seem to drink well after onset. If he moves arm behind head gets some relief. No dysphagia. No new meds He ? If related to prior axillary surgery for hidraadentis but that was years ago.  Never had before  Cardiology w/u neg Multiple chest CT's last few years no hiatal hernia reported  Does not vomit Wt Readings from Last 3 Encounters:  05/18/19 (!) 345 lb (156.5 kg)  05/03/19 (!) 345 lb (156.5 kg)  04/20/19 (!) 345 lb (156.5 kg)    Allergies  Allergen Reactions  . Sulfa Antibiotics Other (See Comments)    Low blood pressure  . Vancomycin Itching    Give with benadryl   Current Meds  Medication Sig  . Adalimumab 40 MG/0.4ML PNKT INJECT THE CONTENTS OF 1 PEN (40MG ) UNDER THE SKIN ONCE EACH WEEK  . albuterol (PROVENTIL HFA;VENTOLIN HFA) 108 (90 Base) MCG/ACT inhaler Inhale 2 puffs into the lungs daily as needed for wheezing or shortness of breath.  Marland Kitchen BIKTARVY 50-200-25 MG TABS tablet Take  1 tablet by mouth once daily  . Cholecalciferol (VITAMIN D) 50 MCG (2000 UT) CAPS Take 1 capsule by mouth.  . furosemide (LASIX) 20 MG tablet Take 20 mg by mouth daily.  Marland Kitchen losartan (COZAAR) 50 MG tablet Take 50 mg by mouth daily.  . metroNIDAZOLE (FLAGYL) 250 MG tablet Take 250 mg by mouth daily.  . vitamin B-12 (CYANOCOBALAMIN) 100 MCG tablet Take 100 mcg by mouth daily.  Alveda Reasons 20 MG TABS tablet TAKE 1 TABLET BY MOUTH ONCE DAILY WITH  SUPPER   Past Medical History:  Diagnosis Date  . Abscess    Between legs  . Arthritis    all over  . Avascular necrosis of femoral head, left ([place])   . Cholelithiasis   . CKD (chronic kidney disease) stage 3, GFR 30-59 ml/min    sees kidney Dr.  . Diverticulosis   . DVT (deep venous thrombosis) (HCC)    legs  . Dyspnea    when walking  . Dysrhythmia    remembers mother taking about having an irregular rhythm years when he was a child   . GERD (gastroesophageal reflux disease)   . HIV (human immunodeficiency virus infection) ([place])   . Morbid obesity ([place])    Past Surgical History:  Procedure Laterality Date  . COLONOSCOPY  2019  . DENTAL SURGERY     had teeth pulled  . HYDRADENITIS EXCISION Left 10/14/2016   Procedure: WIDE EXCISION HIDRADENITIS  LEFT AXILLA;  Surgeon: Coralie Keens, MD;  Location: [facility];  Service: General;  Laterality: Left;  . JOINT REPLACEMENT     Left hip Dr. Ninfa Linden 11/29/16  . 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   Review of Systems Groin hidraadenitis better on Humira Sees Derm [facility]  Objective:   Physical Exam BP 132/72 (BP Location: Left Arm, Patient Position: Sitting, Cuff Size: Large)   Pulse 60   Temp 98.5 F (36.9 C)   Ht 6' (1.829 m) Comment: height measured without shoes  Wt (!) 345 lb (156.5 kg)   BMI 46.79 kg/m  Obese, NAD Anicteric Lungs cta Cor distant Chest wall, left breast normal and nontender abd  Obese, nontender, BS +

## 2019-05-18 NOTE — Patient Instructions (Signed)
You have been scheduled for an endoscopy. Please follow written instructions given to you at your visit today. If you use inhalers (even only as needed), please bring them with you on the day of your procedure.   You will be contacted by our office prior to your procedure for directions on holding your xarelto.  If you do not hear from our office 1 week prior to your scheduled procedure, please call [phone] to discuss.  I appreciate the opportunity to care for you. Silvano Rusk, MD, [facility]

## 2019-05-19 DIAGNOSIS — L732 Hidradenitis suppurativa: Principal | ICD-10-CM

## 2019-05-19 MED FILL — HUMIRA PEN CITRATE FREE 40 MG/0.4 ML: 28 days supply | Qty: 4 | Fill #1

## 2019-05-19 MED FILL — HUMIRA PEN CITRATE FREE 40 MG/0.4 ML: 28 days supply | Qty: 4 | Fill #1 | Status: AC

## 2019-05-19 NOTE — Telephone Encounter (Signed)
I spoke to Dr Isac Sarna office and they said they would get back to me by 5:00pm which they did not. I called the patient and told him we have to have an answer by Friday. I told him we are closed tomorrow for the icy weather coming and are supposed to reopen on Friday at 10:00AM. I will try to get him an answer then.

## 2019-05-21 ENCOUNTER — Ambulatory Visit (INDEPENDENT_AMBULATORY_CARE_PROVIDER_SITE_OTHER): Payer: Medicaid Other

## 2019-05-21 ENCOUNTER — Telehealth: Payer: Self-pay | Admitting: *Deleted

## 2019-05-21 DIAGNOSIS — Z1159 Encounter for screening for other viral diseases: Secondary | ICD-10-CM

## 2019-05-21 NOTE — Telephone Encounter (Signed)
Correction patient informed hold one day not two. He is aware of this.

## 2019-05-21 NOTE — Telephone Encounter (Signed)
I have touched base with the PCP office again and they are going to get back to me today.

## 2019-05-21 NOTE — Telephone Encounter (Signed)
Received call from [place] with Dr. Wilkie Aye office. States they did not receive fax back regarding holding Xarelto for 2 days prior to EGD. This had been faxed on 05/19/2019. Confirmed with PCP that she wants patient to hold xarelto for only one day prior to procedure and resume evening of procedure if there are no complications with EGD. This has been re-faxed with confirmation receipt received. Hubbard Hartshorn, BSN, RN-BC

## 2019-05-21 NOTE — Telephone Encounter (Signed)
Patient informed and verbalized understanding

## 2019-05-21 NOTE — Telephone Encounter (Signed)
See phone note from PCP, got approved to hold the xarelto for 2 days and patient informed.

## 2019-05-22 LAB — SARS CORONAVIRUS 2 (TAT 6-24 HRS): SARS Coronavirus 2: NEGATIVE

## 2019-05-25 ENCOUNTER — Other Ambulatory Visit: Payer: Self-pay

## 2019-05-25 ENCOUNTER — Encounter: Payer: Self-pay | Admitting: Internal Medicine

## 2019-05-25 ENCOUNTER — Ambulatory Visit (AMBULATORY_SURGERY_CENTER): Payer: Medicaid Other | Admitting: Internal Medicine

## 2019-05-25 VITALS — BP 105/45 | HR 52 | Temp 97.3°F | Resp 21 | Ht 72.0 in | Wt 345.0 lb

## 2019-05-25 DIAGNOSIS — K317 Polyp of stomach and duodenum: Secondary | ICD-10-CM

## 2019-05-25 DIAGNOSIS — R0789 Other chest pain: Secondary | ICD-10-CM | POA: Diagnosis not present

## 2019-05-25 DIAGNOSIS — R079 Chest pain, unspecified: Secondary | ICD-10-CM | POA: Diagnosis not present

## 2019-05-25 DIAGNOSIS — Z7689 Persons encountering health services in other specified circumstances: Secondary | ICD-10-CM | POA: Diagnosis not present

## 2019-05-25 DIAGNOSIS — R6881 Early satiety: Secondary | ICD-10-CM | POA: Diagnosis not present

## 2019-05-25 MED ORDER — SODIUM CHLORIDE 0.9 % IV SOLN: 500.0000 mL | Freq: Once | INTRAVENOUS | Status: DC

## 2019-05-25 NOTE — Patient Instructions (Addendum)
There was a tiny polyp in the stomach - I removed it. I doubt it is a problem and it is not related to the pains you have had.  I think the pain is from muscles, bones and cartilage and not the stomach or guts.  I will let you know what the polyp was and if any follow-up needed with me.  Otherwise follow-up with primary care.  Resume Xarelto tomorrow.  I appreciate the opportunity to care for you. Gatha Mayer, MD, [facility]  Resume previous diet. Continue present medications. Await pathology results.  YOU HAD AN ENDOSCOPIC PROCEDURE TODAY AT [facility]:   Refer to the procedure report that was given to you for any specific questions about what was found during the examination.  If the procedure report does not answer your questions, please call your gastroenterologist to clarify.  If you requested that your care partner not be given the details of your procedure findings, then the procedure report has been included in a sealed envelope for you to review at your convenience later.  YOU SHOULD EXPECT: Some feelings of bloating in the abdomen. Passage of more gas than usual.  Walking can help get rid of the air that was put into your GI tract during the procedure and reduce the bloating. If you had a lower endoscopy (such as a colonoscopy or flexible sigmoidoscopy) you may notice spotting of blood in your stool or on the toilet paper. If you underwent a bowel prep for your procedure, you may not have a normal bowel movement for a few days.  Please Note:  You might notice some irritation and congestion in your nose or some drainage.  This is from the oxygen used during your procedure.  There is no need for concern and it should clear up in a day or so.  SYMPTOMS TO REPORT IMMEDIATELY:  Following upper endoscopy (EGD)  Vomiting of blood or coffee ground material  New chest pain or pain under the shoulder blades  Painful or persistently difficult swallowing  New shortness of  breath  Fever of 100F or higher  Black, tarry-looking stools  For urgent or emergent issues, a gastroenterologist can be reached at any hour by calling [phone].   DIET:  We do recommend a small meal at first, but then you may proceed to your regular diet.  Drink plenty of fluids but you should avoid alcoholic beverages for 24 hours.  ACTIVITY:  You should plan to take it easy for the rest of today and you should NOT DRIVE or use heavy machinery until tomorrow (because of the sedation medicines used during the test).    FOLLOW UP: Our staff will call the number listed on your records 48-72 hours following your procedure to check on you and address any questions or concerns that you may have regarding the information given to you following your procedure. If we do not reach you, we will leave a message.  We will attempt to reach you two times.  During this call, we will ask if you have developed any symptoms of COVID 19. If you develop any symptoms (ie: fever, flu-like symptoms, shortness of breath, cough etc.) before then, please call [phone].  If you test positive for Covid 19 in the 2 weeks post procedure, please call and report this information to [place].    If any biopsies were taken you will be contacted by phone or by letter within the next 1-3 weeks.  Please call us  at [phone] if you have not heard about the biopsies in 3 weeks.    SIGNATURES/CONFIDENTIALITY: You and/or your care partner have signed paperwork which will be entered into your electronic medical record.  These signatures attest to the fact that that the information above on your After Visit Summary has been reviewed and is understood.  Full responsibility of the confidentiality of this discharge information lies with you and/or your care-partner.

## 2019-05-25 NOTE — Progress Notes (Signed)
Called to room to assist during endoscopic procedure.  Patient ID and intended procedure confirmed with present staff. Received instructions for my participation in the procedure from the performing physician.  

## 2019-05-25 NOTE — Progress Notes (Signed)
Pt. Using [facility] transportation.  Call was put in for anticipated discharge time when pt. Arrived in recovery.  Call put in to dispatch for sooner pick-up (was scheduled for 12:30).  Pt. Comfortable, waiting in the RR until advised that ride has arrived.  Care partner in lobby.

## 2019-05-25 NOTE — Progress Notes (Signed)
Pt tolerated well. VSS. Awake and to recovery. Oral bite block removed without trauma.

## 2019-05-25 NOTE — Op Note (Addendum)
[place] Patient Name: Allen Espinoza Procedure Date: 05/25/2019 10:48 AM MRN: [name] Endoscopist: Gatha Mayer , MD Age: 59 Referring MD:  Date of Birth: Feb 17, 1961 Gender: Male Account #: [id number] Procedure:                Upper GI endoscopy Indications:              Chest pain (non cardiac) Medicines:                Propofol per Anesthesia, Monitored Anesthesia Care Procedure:                Pre-Anesthesia Assessment:                           - Prior to the procedure, a History and Physical                            was performed, and patient medications and                            allergies were reviewed. The patient's tolerance of                            previous anesthesia was also reviewed. The risks                            and benefits of the procedure and the sedation                            options and risks were discussed with the patient.                            All questions were answered, and informed consent                            was obtained. Prior Anticoagulants: The patient                            last took Xarelto (rivaroxaban) 2 days prior to the                            procedure. ASA Grade Assessment: III - A patient                            with severe systemic disease. After reviewing the                            risks and benefits, the patient was deemed in                            satisfactory condition to undergo the procedure.                           After obtaining informed consent, the endoscope was  passed under direct vision. Throughout the                            procedure, the patient's blood pressure, pulse, and                            oxygen saturations were monitored continuously. The                            Endoscope was introduced through the mouth, and                            advanced to the second part of duodenum. The upper                            GI  endoscopy was accomplished without difficulty.                            The patient tolerated the procedure well. Scope In: Scope Out: Findings:                 A single diminutive sessile polyp with no stigmata                            of recent bleeding was found in the gastric body.                            The polyp was removed with a cold biopsy forceps.                            Resection and retrieval were complete. Verification                            of patient identification for the specimen was                            done. Estimated blood loss was minimal.                           The exam was otherwise without abnormality.                           The cardia and gastric fundus were normal on                            retroflexion. Complications:            No immediate complications. Estimated Blood Loss:     Estimated blood loss was minimal. Impression:               - A single gastric polyp. Resected and retrieved.                           - The examination was otherwise normal. Recommendation:           - Patient  has a contact number available for                            emergencies. The signs and symptoms of potential                            delayed complications were discussed with the                            patient. Return to normal activities tomorrow.                            Written discharge instructions were provided to the                            patient.                           - Resume previous diet.                           - Continue present medications.                           - Await pathology results.                           - DO NOT THINK SYMPTOMS ARE FROM GI TRACT AND NOT                            FROM THE POLYP                           CARDIAC EXAM WAS ALSO NEGATIVE                           SEE PCP IN FOLLOW-UP                           - Resume Xarelto (rivaroxaban) at prior dose                             tomorrow. Gatha Mayer, MD 05/25/2019 11:14:20 AM This report has been signed electronically.

## 2019-05-27 ENCOUNTER — Encounter: Payer: Self-pay | Admitting: Internal Medicine

## 2019-05-27 ENCOUNTER — Telehealth: Payer: Self-pay

## 2019-05-27 NOTE — Telephone Encounter (Signed)
  Follow up Call-  Call back number 05/25/2019 08/06/2017  Post procedure Call Back phone  # [phone]  Permission to leave phone message Yes Yes  Some recent data might be hidden     Patient questions:  Do you have a fever, pain , or abdominal swelling? No. Pain Score  0 *  Have you tolerated food without any problems? Yes.    Have you been able to return to your normal activities? Yes.    Do you have any questions about your discharge instructions: Diet   No. Medications  No. Follow up visit  No.  Do you have questions or concerns about your Care? No.  Actions: * If pain score is 4 or above: No action needed, pain <4. 1. Have you developed a fever since your procedure? no  2.   Have you had an respiratory symptoms (SOB or cough) since your procedure? no  3.   Have you tested positive for COVID 19 since your procedure no  4.   Have you had any family members/close contacts diagnosed with the COVID 19 since your procedure?  no   If yes to any of these questions please route to Joylene John, RN and Alphonsa Gin, Therapist, sports.

## 2019-06-11 NOTE — Unmapped (Signed)
[facility] Pharm[facility] Refill Coordination Note    Specialty Medication(s) to be Shipped:   Inflammatory Disorders: Humira    Other medication(s) to be shipped: n/a     Dustin Prince, DOB: 09-Feb-1961  Phone: [phone] (home)       All above HIPAA information was verified with patient.     Was a Nurse, learning disability used for this call? No    Completed refill call assessment today to schedule patient's medication shipment from the [facility] [phone]).       Specialty medication(s) and dose(s) confirmed: Regimen is correct and unchanged.   Changes to medications: Dustin Prince reports no changes at this time.  Changes to insurance: No  Questions for the pharmacist: No    Confirmed patient received Welcome Packet with first shipment. The patient will receive a drug information handout for each medication shipped and additional FDA Medication Guides as required.       DISEASE/MEDICATION-SPECIFIC INFORMATION        For patients on injectable medications: Patient currently has 1 doses left.  Next injection is scheduled for 06/15/2019.    SPECIALTY MEDICATION ADHERENCE     Medication Adherence    Patient reported X missed doses in the last month: 0  Specialty Medication: Humira CF 40 mg/0.4 ml  Patient is on additional specialty medications: No  Any gaps in refill history greater than 2 weeks in the last 3 months: no  Demonstrates understanding of importance of adherence: yes  Informant: patient  Reliability of informant: reliable  Support network for adherence: family member  Confirmed plan for next specialty medication refill: delivery by pharmacy  Refills needed for supportive medications: not needed                Humira CF 40 mg/0.4 ml. 7 days on hand       SHIPPING     Shipping address confirmed in Epic.     Delivery Scheduled: Yes, Expected medication delivery date: 06/15/2019.     Medication will be delivered via UPS to the prescription address in Epic WAM.    Dustin Prince Dustin Prince   [facility] Specialty Technician

## 2019-06-14 MED FILL — HUMIRA PEN CITRATE FREE 40 MG/0.4 ML: 28 days supply | Qty: 4 | Fill #2 | Status: AC

## 2019-06-14 MED FILL — HUMIRA PEN CITRATE FREE 40 MG/0.4 ML: 28 days supply | Qty: 4 | Fill #2

## 2019-07-01 NOTE — Unmapped (Signed)
erroneous

## 2019-07-07 NOTE — Unmapped (Signed)
[facility] Refill Coordination Note    Specialty Medication(s) to be Shipped:   Inflammatory Disorders: Humira    Other medication(s) to be shipped: n/a     [name], DOB: February 16, 1961  Phone: [phone] (home)       All above HIPAA information was verified with patient.     Was a Nurse, learning disability used for this call? No    Completed refill call assessment today to schedule patient's medication shipment from the [facility] [phone]).       Specialty medication(s) and dose(s) confirmed: Regimen is correct and unchanged.   Changes to medications: Daejon reports no changes at this time.  Changes to insurance: No  Questions for the pharmacist: No    Confirmed patient received Welcome Packet with first shipment. The patient will receive a drug information handout for each medication shipped and additional FDA Medication Guides as required.       DISEASE/MEDICATION-SPECIFIC INFORMATION        For patients on injectable medications: Patient currently has 1 doses left.  Next injection is scheduled for 07/13/2019.    SPECIALTY MEDICATION ADHERENCE     Medication Adherence    Patient reported X missed doses in the last month: 0  Specialty Medication: Humira CF 40 mg/0.4 ml   Patient is on additional specialty medications: No  Any gaps in refill history greater than 2 weeks in the last 3 months: no  Demonstrates understanding of importance of adherence: yes  Informant: patient  Reliability of informant: reliable  Support network for adherence: family member  Confirmed plan for next specialty medication refill: delivery by pharmacy  Refills needed for supportive medications: not needed                Humira CF 40 mg/0.4 ml. 7 days on hand       SHIPPING     Shipping address confirmed in Epic.     Delivery Scheduled: Yes, Expected medication delivery date: 07/13/2019.     Medication will be delivered via UPS to the prescription address in Epic WAM.    Mashal Slavick D Arlina Sabina   [facility] Specialty Technician

## 2019-07-12 DIAGNOSIS — Z7689 Persons encountering health services in other specified circumstances: Secondary | ICD-10-CM | POA: Diagnosis not present

## 2019-07-12 MED FILL — HUMIRA PEN CITRATE FREE 40 MG/0.4 ML: 28 days supply | Qty: 4 | Fill #3

## 2019-07-12 MED FILL — HUMIRA PEN CITRATE FREE 40 MG/0.4 ML: 28 days supply | Qty: 4 | Fill #3 | Status: AC

## 2019-07-14 ENCOUNTER — Ambulatory Visit: Payer: Medicaid Other | Attending: Internal Medicine

## 2019-07-14 DIAGNOSIS — Z20822 Contact with and (suspected) exposure to covid-19: Secondary | ICD-10-CM

## 2019-07-15 ENCOUNTER — Other Ambulatory Visit: Payer: Self-pay | Admitting: Physician Assistant

## 2019-07-15 DIAGNOSIS — N189 Chronic kidney disease, unspecified: Secondary | ICD-10-CM

## 2019-07-15 DIAGNOSIS — I10 Essential (primary) hypertension: Secondary | ICD-10-CM

## 2019-07-15 DIAGNOSIS — B20 Human immunodeficiency virus [HIV] disease: Secondary | ICD-10-CM

## 2019-07-15 DIAGNOSIS — U071 COVID-19: Secondary | ICD-10-CM

## 2019-07-15 LAB — NOVEL CORONAVIRUS, NAA: SARS-CoV-2, NAA: DETECTED — AB

## 2019-07-15 LAB — SARS-COV-2, NAA 2 DAY TAT

## 2019-07-15 MED ORDER — SODIUM CHLORIDE 0.9 % IV SOLN
Freq: Once | INTRAVENOUS | Status: AC
  Filled 2019-07-15: qty 700

## 2019-07-15 NOTE — Progress Notes (Signed)
  I connected by phone with Allen Espinoza on 07/15/2019 at 8:37 AM to discuss the potential use of an new treatment for mild to moderate COVID-19 viral infection in non-hospitalized patients.  This patient is a 59 y.o. male that meets the FDA criteria for Emergency Use Authorization of bamlanivimab/etesevimab or casirivimab/imdevimab.  Has a (+) direct SARS-CoV-2 viral test result  Has mild or moderate COVID-19   Is ? 59 years of age and weighs ? 40 kg  Is NOT hospitalized due to COVID-19  Is NOT requiring oxygen therapy or requiring an increase in baseline oxygen flow rate due to COVID-19  Is within 10 days of symptom onset  Has at least one of the high risk factor(s) for progression to severe COVID-19 and/or hospitalization as defined in EUA.  Specific high risk criteria : Hypertension, HIV, CKD   I have spoken and communicated the following to the patient or parent/caregiver:  1. FDA has authorized the emergency use of bamlanivimab/etesevimab and casirivimab\imdevimab for the treatment of mild to moderate COVID-19 in adults and pediatric patients with positive results of direct SARS-CoV-2 viral testing who are 71 years of age and older weighing at least 40 kg, and who are at high risk for progressing to severe COVID-19 and/or hospitalization.  2. The significant known and potential risks and benefits of bamlanivimab/etesevimab and casirivimab\imdevimab, and the extent to which such potential risks and benefits are unknown.  3. Information on available alternative treatments and the risks and benefits of those alternatives, including clinical trials.  4. Patients treated with bamlanivimab/etesevimab and casirivimab\imdevimab should continue to self-isolate and use infection control measures (e.g., wear mask, isolate, social distance, avoid sharing personal items, clean and disinfect "high touch" surfaces, and frequent handwashing) according to CDC guidelines.   5. The patient or  parent/caregiver has the option to accept or refuse bamlanivimab/etesevimab or casirivimab\imdevimab .  After reviewing this information with the patient, The patient agreed to proceed with receiving the bamlanivimab/etesevimab infusion and will be provided a copy of the Fact sheet prior to receiving the infusion.    Sx onset 4/9. Set up for infusion tomorrow with his fiance. Transportation will be provided.   Angelena Form 07/15/2019 8:37 AM

## 2019-07-16 ENCOUNTER — Ambulatory Visit (HOSPITAL_COMMUNITY)
Admission: RE | Admit: 2019-07-16 | Discharge: 2019-07-16 | Disposition: A | Discharge status: Home / Self Care | Payer: Medicaid Other | Source: Ambulatory Visit | Attending: Pulmonary Disease | Admitting: Pulmonary Disease

## 2019-07-16 DIAGNOSIS — B20 Human immunodeficiency virus [HIV] disease: Secondary | ICD-10-CM | POA: Insufficient documentation

## 2019-07-16 DIAGNOSIS — N189 Chronic kidney disease, unspecified: Secondary | ICD-10-CM | POA: Diagnosis not present

## 2019-07-16 DIAGNOSIS — I10 Essential (primary) hypertension: Secondary | ICD-10-CM | POA: Insufficient documentation

## 2019-07-16 DIAGNOSIS — U071 COVID-19: Secondary | ICD-10-CM | POA: Insufficient documentation

## 2019-07-16 MED ORDER — EPINEPHRINE 0.3 MG/0.3ML IJ SOAJ: 0.3000 mg | Freq: Once | INTRAMUSCULAR | Status: DC | PRN

## 2019-07-16 MED ORDER — FAMOTIDINE IN NACL 20-0.9 MG/50ML-% IV SOLN: 20.0000 mg | Freq: Once | INTRAVENOUS | Status: DC | PRN

## 2019-07-16 MED ORDER — DIPHENHYDRAMINE HCL 50 MG/ML IJ SOLN: 50.0000 mg | Freq: Once | INTRAMUSCULAR | Status: DC | PRN

## 2019-07-16 MED ORDER — SODIUM CHLORIDE 0.9 % IV SOLN: INTRAVENOUS | Status: DC | PRN

## 2019-07-16 MED ORDER — ALBUTEROL SULFATE HFA 108 (90 BASE) MCG/ACT IN AERS: 2.0000 | INHALATION_SPRAY | Freq: Once | RESPIRATORY_TRACT | Status: DC | PRN

## 2019-07-16 MED ORDER — METHYLPREDNISOLONE SODIUM SUCC 125 MG IJ SOLR: 125.0000 mg | Freq: Once | INTRAMUSCULAR | Status: DC | PRN

## 2019-07-16 NOTE — Progress Notes (Signed)
  Diagnosis: COVID-19  Physician: Dr. Joya Gaskins  Procedure: [facility] Med: bamlanivimab\etesevimab infusion - Provided patient with bamlanimivab\etesevimab fact sheet for patients, parents and caregivers prior to infusion.  Complications: No immediate complications noted.  Discharge: Discharged home   Janine Ores 07/16/2019

## 2019-07-16 NOTE — Discharge Instructions (Signed)

## 2019-07-26 ENCOUNTER — Encounter: Payer: Self-pay | Admitting: *Deleted

## 2019-08-03 DIAGNOSIS — L732 Hidradenitis suppurativa: Secondary | ICD-10-CM | POA: Diagnosis not present

## 2019-08-03 DIAGNOSIS — R0789 Other chest pain: Secondary | ICD-10-CM | POA: Diagnosis not present

## 2019-08-03 DIAGNOSIS — Z21 Asymptomatic human immunodeficiency virus [HIV] infection status: Secondary | ICD-10-CM | POA: Diagnosis not present

## 2019-08-03 DIAGNOSIS — I82409 Acute embolism and thrombosis of unspecified deep veins of unspecified lower extremity: Secondary | ICD-10-CM | POA: Diagnosis not present

## 2019-08-03 DIAGNOSIS — N183 Chronic kidney disease, stage 3 unspecified: Secondary | ICD-10-CM | POA: Diagnosis not present

## 2019-08-03 DIAGNOSIS — I519 Heart disease, unspecified: Secondary | ICD-10-CM | POA: Diagnosis not present

## 2019-08-03 DIAGNOSIS — I129 Hypertensive chronic kidney disease with stage 1 through stage 4 chronic kidney disease, or unspecified chronic kidney disease: Secondary | ICD-10-CM | POA: Diagnosis not present

## 2019-08-03 DIAGNOSIS — M87052 Idiopathic aseptic necrosis of left femur: Secondary | ICD-10-CM | POA: Diagnosis not present

## 2019-08-05 NOTE — Unmapped (Signed)
Dustin Prince reports things are stable with his Humira. He has continued to have some flares, and reports the gentamycin irrigation wasn't effective. He continues to take metronidazole TID.    He reports he was covid + 3 weeks ago on 4/12. He received monoclonal antibody treatment, and did not stop his Humira (unclear, he reports nurses were aware he was taking Humira). Since he's > 2 weeks out from the usual hold recommendations, we'll continue his treatment. His symptoms have improved.     [facility] Clinical Assessment & Refill Coordination Note    Dustin Prince, [place]: 1960-10-22  Phone: [phone] (home)     All above HIPAA information was verified with patient.     Was a Nurse, learning disability used for this call? No    Specialty Medication(s):   Inflammatory Disorders: Cosentyx     Current Outpatient Medications   Medication Sig Dispense Refill   ??? albuterol HFA 90 mcg/actuation inhaler Inhale 2 puffs.     ??? bictegrav-emtricit-tenofov ala (BIKTARVY) 50-200-25 mg tablet Take 1 tablet by mouth daily.     ??? CLONAZEPAM ORAL Take by mouth.     ??? cyclobenzaprine (FLEXERIL) 5 MG tablet TAKE 1 TABLET BY MOUTH AT BEDTIME     ??? furosemide (LASIX) 20 MG tablet Take 20 mg by mouth daily.     ??? gentamicin, PF, (GARAMYCIN) 10 mg/mL Soln Take 2 vials to dermatology appointment three times a week as directed 20 mL 10   ??? HUMIRA PEN CITRATE FREE 40 MG/0.4 ML Inject the contents of 1 pen (40mg ) under the skin once weekly. 4 each 11   ??? HYDROcodone-acetaminophen (NORCO) 5-325 mg per tablet Take 1-2 tablets every 6 hours as needed for pain 15 tablet 0   ??? metroNIDAZOLE (FLAGYL) 250 MG tablet Take 1 tablet (250 mg total) by mouth Three (3) times a day. 270 tablet 2   ??? pantoprazole (PROTONIX) 40 MG tablet Take 40 mg by mouth daily.     ??? rivaroxaban (XARELTO) 20 mg tablet Take 20 mg by mouth daily with evening meal.       No current facility-administered medications for this visit.        Changes to medications: Dustin Prince reports no changes at this time.    Allergies   Allergen Reactions   ??? Sulfasalazine      Other reaction(s): Other (See Comments)  Low blood pressure   ??? Vancomycin Itching     Give with benadryl       Changes to allergies: No    SPECIALTY MEDICATION ADHERENCE     Humira - 1 left    Medication Adherence    Patient reported X missed doses in the last month: 0  Specialty Medication: Humira  Patient is on additional specialty medications: No  Support network for adherence: family member          Specialty medication(s) dose(s) confirmed: Regimen is correct and unchanged.     Are there any concerns with adherence? No    Adherence counseling provided? Not needed    CLINICAL MANAGEMENT AND INTERVENTION      Clinical Benefit Assessment:    Do you feel the medicine is effective or helping your condition? Yes    Clinical Benefit counseling provided? Not needed    Adverse Effects Assessment:    Are you experiencing any side effects? No    Are you experiencing difficulty administering your medicine? No    Quality of Life Assessment:    How  many days over the past month did your HS  keep you from your normal activities? For example, brushing your teeth or getting up in the morning. 0    Have you discussed this with your provider? Not needed    Therapy Appropriateness:    Is therapy appropriate? Yes, therapy is appropriate and should be continued    DISEASE/MEDICATION-SPECIFIC INFORMATION      For patients on injectable medications: Patient currently has 1 doses left.  Next injection is scheduled for Tues, 5/11.    PATIENT SPECIFIC NEEDS     - Does the patient have any physical, cognitive, or cultural barriers? No    - Is the patient high risk? No     - Does the patient require a Care Management Plan? No     - Does the patient require physician intervention or other additional services (i.e. nutrition, smoking cessation, social work)? No      SHIPPING     Specialty Medication(s) to be Shipped:   Inflammatory Disorders: Humira    Other medication(s) to be shipped: NA     Changes to insurance: No    Delivery Scheduled: Yes, Expected medication delivery date: Thurs, May 13.     Medication will be delivered via UPS to the confirmed prescription address in [facility].    The patient will receive a drug information handout for each medication shipped and additional FDA Medication Guides as required.  Verified that patient has previously received a Conservation officer, historic buildings.    All of the patient's questions and concerns have been addressed.    Lanney Gins   [facility] Shared [facility] Specialty Pharmacist

## 2019-08-10 ENCOUNTER — Other Ambulatory Visit: Payer: Self-pay | Admitting: *Deleted

## 2019-08-10 ENCOUNTER — Other Ambulatory Visit: Payer: Self-pay

## 2019-08-10 DIAGNOSIS — I82409 Acute embolism and thrombosis of unspecified deep veins of unspecified lower extremity: Secondary | ICD-10-CM

## 2019-08-10 DIAGNOSIS — I825Z2 Chronic embolism and thrombosis of unspecified deep veins of left distal lower extremity: Secondary | ICD-10-CM

## 2019-08-10 MED ORDER — RIVAROXABAN 20 MG PO TABS: ORAL_TABLET | ORAL | 1 refills | Status: DC

## 2019-08-10 NOTE — Telephone Encounter (Signed)
Received TC from [place].  Patient is try to get his refill on Xarelto, but Dr. Carlynn Purl medicaid license will not go through, states it is coming up as expired. Will send request to attending's for today to consider refilling. Thank you, SChaplin, RN,BSN

## 2019-08-11 MED ORDER — RIVAROXABAN 20 MG PO TABS: 20.0000 mg | ORAL_TABLET | Freq: Every day | ORAL | 0 refills | Status: DC

## 2019-08-11 MED ORDER — FUROSEMIDE 20 MG PO TABS: 20.0000 mg | ORAL_TABLET | Freq: Every day | ORAL | 0 refills | Status: DC

## 2019-08-11 MED FILL — HUMIRA PEN CITRATE FREE 40 MG/0.4 ML: 28 days supply | Qty: 4 | Fill #4

## 2019-08-11 MED FILL — HUMIRA PEN CITRATE FREE 40 MG/0.4 ML: 28 days supply | Qty: 4 | Fill #4 | Status: AC

## 2019-09-01 NOTE — Unmapped (Signed)
[facility] Pharm[facility] Refill Coordination Note    Specialty Medication(s) to be Shipped:   Inflammatory Disorders: Humira    Other medication(s) to be shipped: n/a     [name], DOB: 02/26/1961  Phone: [phone] (home)       All above HIPAA information was verified with patient.     Was a Nurse, learning disability used for this call? No    Completed refill call assessment today to schedule patient's medication shipment from the [facility] [phone]).       Specialty medication(s) and dose(s) confirmed: Regimen is correct and unchanged.   Changes to medications: Eldon reports no changes at this time.  Changes to insurance: No  Questions for the pharmacist: No    Confirmed patient received Welcome Packet with first shipment. The patient will receive a drug information handout for each medication shipped and additional FDA Medication Guides as required.       DISEASE/MEDICATION-SPECIFIC INFORMATION        For patients on injectable medications: Patient currently has 1 doses left.  Next injection is scheduled for 09/07/2019.    SPECIALTY MEDICATION ADHERENCE     Medication Adherence    Patient reported X missed doses in the last month: 0  Specialty Medication: Humira CF 40 mg/0.4 ml  Patient is on additional specialty medications: No  Any gaps in refill history greater than 2 weeks in the last 3 months: no  Demonstrates understanding of importance of adherence: yes  Informant: patient  Reliability of informant: reliable  Support network for adherence: family member  Confirmed plan for next specialty medication refill: delivery by pharmacy  Refills needed for supportive medications: not needed                Humira CF 40 mg/0.4 ml. 7 days on hand       SHIPPING     Shipping address confirmed in Epic.     Delivery Scheduled: Yes, Expected medication delivery date: 09/07/2019.     Medication will be delivered via UPS to the prescription address in Epic WAM.    Ellarae Nevitt D Ashland Osmer   [facility] Specialty Technician

## 2019-09-06 MED FILL — HUMIRA PEN CITRATE FREE 40 MG/0.4 ML: 28 days supply | Qty: 4 | Fill #5

## 2019-09-06 MED FILL — HUMIRA PEN CITRATE FREE 40 MG/0.4 ML: 28 days supply | Qty: 4 | Fill #5 | Status: AC

## 2019-09-08 ENCOUNTER — Ambulatory Visit: Payer: Medicaid Other | Attending: Internal Medicine

## 2019-09-08 DIAGNOSIS — Z20822 Contact with and (suspected) exposure to covid-19: Secondary | ICD-10-CM

## 2019-09-09 LAB — NOVEL CORONAVIRUS, NAA: SARS-CoV-2, NAA: NOT DETECTED

## 2019-09-09 LAB — SARS-COV-2, NAA 2 DAY TAT

## 2019-09-13 DIAGNOSIS — H40023 Open angle with borderline findings, high risk, bilateral: Secondary | ICD-10-CM | POA: Diagnosis not present

## 2019-09-13 DIAGNOSIS — H40033 Anatomical narrow angle, bilateral: Secondary | ICD-10-CM | POA: Diagnosis not present

## 2019-09-22 ENCOUNTER — Encounter: Payer: Self-pay | Admitting: Internal Medicine

## 2019-09-22 ENCOUNTER — Other Ambulatory Visit: Payer: Self-pay

## 2019-09-22 ENCOUNTER — Telehealth: Payer: Self-pay | Admitting: *Deleted

## 2019-09-22 ENCOUNTER — Ambulatory Visit: Payer: Medicaid Other | Admitting: Internal Medicine

## 2019-09-22 DIAGNOSIS — M7989 Other specified soft tissue disorders: Secondary | ICD-10-CM | POA: Insufficient documentation

## 2019-09-22 LAB — GLUCOSE, CAPILLARY: Glucose-Capillary: 104 mg/dL — ABNORMAL HIGH (ref 70–99)

## 2019-09-22 LAB — POCT GLYCOSYLATED HEMOGLOBIN (HGB A1C): Hemoglobin A1C: 5.3 % (ref 4.0–5.6)

## 2019-09-22 NOTE — Telephone Encounter (Signed)
Patient called in stating he has had bilat leg swelling x 4 days after spending a lot of time on his feet. Legs are also painful. Has some SHOB but that is his norm. Takes Xarelto for hx of blod clots. Patient given [facility] appt this AM. L. Euleta Belson, BSN, RN-BC

## 2019-09-22 NOTE — Assessment & Plan Note (Signed)
°  Patient reports that about 4 days ago he started having bilateral lower extremity swelling, he reports he took a train ride to [place] and back around this time and attended a barbecue.  On his right back he reported some pain in his right leg, however he shook out his leg and that resolved, he denies any pain at this time.  He denies any redness or warmth in his legs.  He has been elevating his legs and using compression stockings, he reports that the swelling has actually improved.  He does have a history of shortness of breath that is constant, does not feel that it is getting any worse.  He always sleeps on a recliner, has done this for the past 2 years, does not feel that he gets short of breath when he lies flat and does not wake up with shortness of breath.  He does report having a significant weight gain over the past 3 years, is up about 3 pounds over the past month.  He reports taking his Xarelto as prescribed, does not miss any doses.  On exam he has 1+ bilateral lower extremity swelling up to the mid shins, no erythema, warmth, tenderness, or limited range of motion.  Negative Homans' sign.  His lungs are clear to auscultation, cardiac exam is unremarkable.  Overall his symptoms are not concerning for a DVT at this time, his Wells criteria showed a low risk.  While he does have history of Covid and there is a concern about possible heart failure, he does not have any orthopnea or PND, exam is significant for no elevated JVD and lungs are clear.  He also had an echocardiogram in 03/2019 that showed a normal EF and was otherwise unremarkable.  At this time seems to be more consistent with chronic venous insufficiency.  Advised patient to continue using compression stockings and elevating legs at night.  Advised patient to monitor for worsening symptoms orthopnea, chest pain, worsening dyspnea on exertion, unilateral leg pain or swelling, warmth, or redness.  Advised to contact us if symptoms worsen, could  consider echocardiogram.   -Leg compression -Elevate legs at night -Provided reassurance, f/u as needed

## 2019-09-22 NOTE — Progress Notes (Signed)
CC: Bilateral leg swelling  HPI:  [name] is a 59 y.o. with the history listed below including a left lower extremity DVT on Xeralto, CKD stage 3, HTN, HIV, and recent COVID infection presenting with a 4 day history of bilateral lower extremity swelling.   Patient reports that about 4 days ago he started having bilateral lower extremity swelling, he reports he took a train ride to [place] and back around this time and attended a barbecue.  On his right back he reported some pain in his right leg, however he shook out his leg and that resolved, he denies any pain at this time.  He denies any redness or warmth in his legs.  He has been elevating his legs and using compression stockings, he reports that the swelling has actually improved.  He does have a history of shortness of breath that is constant, does not feel that it is getting any worse.  He always sleeps on a recliner, has done this for the past 2 years, does not feel that he gets short of breath when he lies flat and does not wake up with shortness of breath.  He does report having a significant weight gain over the past 3 years, is up about 3 pounds over the past month.  He reports taking his Xarelto as prescribed, does not miss any doses.  Past Medical History:  Diagnosis Date  . Abscess    Between legs  . Arthritis    all over  . Asthma   . Avascular necrosis of femoral head, left ([place])   . Blood transfusion without reported diagnosis   . Cholelithiasis   . CKD (chronic kidney disease) stage 3, GFR 30-59 ml/min    sees kidney Dr.  . Clotting disorder [facility])    left DVT  . Diverticulosis   . DVT (deep venous thrombosis) ([facility])    legs  . Dyspnea    when walking  . Dysrhythmia    remembers mother taking about having an irregular rhythm years when he was a child   . GERD (gastroesophageal reflux disease)   . HIV (human immunodeficiency virus infection) ([place])   . Hypertension   . Morbid obesity ([place])    Review of  Systems:   Constitutional: Negative for chills and fever.  Respiratory: Negative for shortness of breath.   Cardiovascular: Negative for chest pain, orthopnea, or worsening DOE. Positive for leg swelling.  Gastrointestinal: Negative for abdominal pain, nausea and vomiting.  Neurological: Negative for dizziness and headaches.   Physical Exam: Vitals:   09/22/19 1022  BP: 139/73  Pulse: (!) 56  Temp: 98 F (36.7 C)  TempSrc: Oral  SpO2: 100%  Weight: (!) 341 lb 3.2 oz (154.8 kg)   Physical Exam Constitutional:      Appearance: Normal appearance. He is obese.  HENT:     Head: Normocephalic and atraumatic.     Nose: Nose normal.     Mouth/Throat:     Mouth: Mucous membranes are moist.     Pharynx: Oropharynx is clear.  Eyes:     Extraocular Movements: Extraocular movements intact.     Pupils: Pupils are equal, round, and reactive to light.  Cardiovascular:     Rate and Rhythm: Normal rate and regular rhythm.     Pulses: Normal pulses.     Heart sounds: Normal heart sounds.  Pulmonary:     Effort: Pulmonary effort is normal. No respiratory distress.     Breath sounds: Normal  breath sounds. No wheezing, rhonchi or rales.  Abdominal:     General: Abdomen is flat. Bowel sounds are normal. There is no distension.     Palpations: Abdomen is soft.  Musculoskeletal:     Right lower leg: Edema (1+ to mid shin) present.     Left lower leg: No edema (1+ to mid shin).     Comments: Negative Homans sign. No pain to palpation or movement.   Skin:    General: Skin is warm and dry.     Capillary Refill: Capillary refill takes less than 2 seconds.  Neurological:     General: No focal deficit present.     Mental Status: He is alert and oriented to person, place, and time.  Psychiatric:        Mood and Affect: Mood normal.        Behavior: Behavior normal.      Assessment & Plan:   See Encounters Tab for problem based charting.  Patient discussed with Dr. Rebeca Alert

## 2019-09-22 NOTE — Telephone Encounter (Signed)
Thank you, agree we should see him. We will assess him at his [facility] appt.

## 2019-09-22 NOTE — Patient Instructions (Addendum)
Allen Espinoza,  It was a pleasure to see you today. Thank you for coming in.   Today we discussed your leg swelling. This seems to be related to you standing on your feet for long periods of time. This does not seem consistent with a blood clot. Please continue elevating your legs at night and using the compression stocking.  Please monitor for symptoms of worsening shortness of breath, chest pain, leg pain, redness or warmth, fevers, or need to sleep more upright at night. Contact us if this occurs.  We have ordered an A1c. We will contact you with the results.  Please return to clinic in 2-4 weeks or sooner if needed.   Thank you again for coming in.   Lonia Skinner M.D.  Chronic Venous Insufficiency Chronic venous insufficiency is a condition where the leg veins cannot effectively pump blood from the legs to the heart. This happens when the vein walls are either stretched, weakened, or damaged, or when the valves inside the vein are damaged. With the right treatment, you should be able to continue with an active life. This condition is also called venous stasis. What are the causes? Common causes of this condition include:  High blood pressure inside the veins (venous hypertension).  Sitting or standing too long, causing increased blood pressure in the leg veins.  A blood clot that blocks blood flow in a vein (deep vein thrombosis, DVT).  Inflammation of a vein (phlebitis) that causes a blood clot to form.  Tumors in the pelvis that cause blood to back up. What increases the risk? The following factors may make you more likely to develop this condition:  Having a family history of this condition.  Obesity.  Pregnancy.  Living without enough regular physical activity or exercise (sedentary lifestyle).  Smoking.  Having a job that requires long periods of standing or sitting in one place.  Being a certain age. Women in their 55s and 73s and men in their 61s are more  likely to develop this condition. What are the signs or symptoms? Symptoms of this condition include:  Veins that are enlarged, bulging, or twisted (varicose veins).  Skin breakdown or ulcers.  Reddened skin or dark discoloration of skin on the leg between the knee and ankle.  Brown, smooth, tight, and painful skin just above the ankle, usually on the inside of the leg (lipodermatosclerosis).  Swelling of the legs. How is this diagnosed? This condition may be diagnosed based on:  Your medical history.  A physical exam.  Tests, such as: ? A procedure that creates an image of a blood vessel and nearby organs and provides information about blood flow through the blood vessel (duplex ultrasound). ? A procedure that tests blood flow (plethysmography). ? A procedure that looks at the veins using X-ray and dye (venogram). How is this treated? The goals of treatment are to help you return to an active life and to minimize pain or disability. Treatment depends on the severity of your condition, and it may include:  Wearing compression stockings. These can help relieve symptoms and help prevent your condition from getting worse. However, they do not cure the condition.  Sclerotherapy. This procedure involves an injection of a solution that shrinks damaged veins.  Surgery. This may involve: ? Removing a diseased vein (vein stripping). ? Cutting off blood flow through the vein (laser ablation surgery). ? Repairing or reconstructing a valve within the affected vein. Follow these instructions at home:  Wear compression stockings as told by your health care provider. These stockings help to prevent blood clots and reduce swelling in your legs.  Take over-the-counter and prescription medicines only as told by your health care provider.  Stay active by exercising, walking, or doing different activities. Ask your health care provider what activities are safe for you and how much  exercise you need.  Drink enough fluid to keep your urine pale yellow.  Do not use any products that contain nicotine or tobacco, such as cigarettes, e-cigarettes, and chewing tobacco. If you need help quitting, ask your health care provider.  Keep all follow-up visits as told by your health care provider. This is important. Contact a health care provider if you:  Have redness, swelling, or more pain in the affected area.  See a red streak or line that goes up or down from the affected area.  Have skin breakdown or skin loss in the affected area, even if the breakdown is small.  Get an injury in the affected area. Get help right away if:  You get an injury and an open wound in the affected area.  You have: ? Severe pain that does not get better with medicine. ? Sudden numbness or weakness in the foot or ankle below the affected area. ? Trouble moving your foot or ankle. ? A fever. ? Worse or persistent symptoms. ? Chest pain. ? Shortness of breath. Summary  Chronic venous insufficiency is a condition where the leg veins cannot effectively pump blood from the legs to the heart.  Chronic venous insufficiency occurs when the vein walls become stretched, weakened, or damaged, or when valves within the vein are damaged.  Treatment depends on how severe your condition is. It often involves wearing compression stockings and may involve having a procedure.  Make sure you stay active by exercising, walking, or doing different activities. Ask your health care provider what activities are safe for you and how much exercise you need. This information is not intended to replace advice given to you by your health care provider. Make sure you discuss any questions you have with your health care provider. Document Revised: 12/09/2017 Document Reviewed: 12/09/2017 Elsevier Patient Education  [place].

## 2019-09-23 NOTE — Progress Notes (Signed)
Internal Medicine Clinic Attending  Case discussed with Dr. Krienke at the time of the visit.  We reviewed the resident's history and exam and pertinent patient test results.  I agree with the assessment, diagnosis, and plan of care documented in the resident's note.  Sotirios Navarro, M.D., Ph.D.  

## 2019-09-29 NOTE — Unmapped (Signed)
[facility] Pharm[facility] Refill Coordination Note    Specialty Medication(s) to be Shipped:   Inflammatory Disorders: Humira    Other medication(s) to be shipped: n/a     Dustin Prince, DOB: 16-Feb-1961  Phone: [phone] (home)       All above HIPAA information was verified with patient.     Was a Nurse, learning disability used for this call? No    Completed refill call assessment today to schedule patient's medication shipment from the [facility] [phone]).       Specialty medication(s) and dose(s) confirmed: Regimen is correct and unchanged.   Changes to medications: Dustin Prince reports no changes at this time.  Changes to insurance: No  Questions for the pharmacist: No    Confirmed patient received Welcome Packet with first shipment. The patient will receive a drug information handout for each medication shipped and additional FDA Medication Guides as required.       DISEASE/MEDICATION-SPECIFIC INFORMATION        For patients on injectable medications: Patient currently has 1 doses left.  Next injection is scheduled for 10/05/2019.    SPECIALTY MEDICATION ADHERENCE     Medication Adherence    Patient reported X missed doses in the last month: 0  Specialty Medication: Humira CF 40 mg/0.4 ml  Patient is on additional specialty medications: No  Any gaps in refill history greater than 2 weeks in the last 3 months: no  Demonstrates understanding of importance of adherence: yes  Informant: patient  Reliability of informant: reliable  Support network for adherence: family member  Confirmed plan for next specialty medication refill: delivery by pharmacy  Refills needed for supportive medications: not needed                Humira CF 40 mg/0.4 ml. 7 days on hand       SHIPPING     Shipping address confirmed in Epic.     Delivery Scheduled: Yes, Expected medication delivery date: 10/07/2019.     Medication will be delivered via UPS to the prescription address in Epic WAM.    Dustin Prince   [facility] Specialty Technician

## 2019-10-06 MED FILL — HUMIRA PEN CITRATE FREE 40 MG/0.4 ML: 28 days supply | Qty: 4 | Fill #6 | Status: AC

## 2019-10-06 MED FILL — HUMIRA PEN CITRATE FREE 40 MG/0.4 ML: 28 days supply | Qty: 4 | Fill #6

## 2019-10-27 NOTE — Unmapped (Signed)
[facility] Refill Coordination Note    Specialty Medication(s) to be Shipped:   Inflammatory Disorders: Humira    Other medication(s) to be shipped: No additional medications requested for fill at this time     [name], DOB: [birth year]  Phone: [phone] (home)       All above HIPAA information was verified with patient.     Was a Nurse, learning disability used for this call? No    Completed refill call assessment today to schedule patient's medication shipment from the [facility] [phone]).       Specialty medication(s) and dose(s) confirmed: Regimen is correct and unchanged.   Changes to medications: Hill reports no changes at this time.  Changes to insurance: No  Questions for the pharmacist: No    Confirmed patient received Welcome Packet with first shipment. The patient will receive a drug information handout for each medication shipped and additional FDA Medication Guides as required.       DISEASE/MEDICATION-SPECIFIC INFORMATION        For patients on injectable medications: Patient currently has 1 doses left.  Next injection is scheduled for 11/02/2019.    SPECIALTY MEDICATION ADHERENCE     Medication Adherence    Patient reported X missed doses in the last month: 0  Specialty Medication: Humira CF 40 mg/0.4 ml  Patient is on additional specialty medications: No  Any gaps in refill history greater than 2 weeks in the last 3 months: no  Demonstrates understanding of importance of adherence: yes  Informant: patient  Reliability of informant: reliable  Support network for adherence: family member  Confirmed plan for next specialty medication refill: delivery by pharmacy  Refills needed for supportive medications: not needed                      SHIPPING     Shipping address confirmed in Epic.     Delivery Scheduled: Yes, Expected medication delivery date: 11/03/2019.     Medication will be delivered via UPS to the prescription address in Epic WAM.    Aideen Fenster D Eduar Kumpf   [facility] Specialty Technician

## 2019-11-02 ENCOUNTER — Other Ambulatory Visit: Payer: Self-pay | Admitting: *Deleted

## 2019-11-02 MED FILL — HUMIRA PEN CITRATE FREE 40 MG/0.4 ML: 28 days supply | Qty: 4 | Fill #7

## 2019-11-02 MED FILL — HUMIRA PEN CITRATE FREE 40 MG/0.4 ML: 28 days supply | Qty: 4 | Fill #7 | Status: AC

## 2019-11-02 NOTE — Telephone Encounter (Signed)
Patient had a 6 month prescription sent to pharmacy 2 months ago. He should not require refill for another 4 months if taking as prescribed.

## 2019-11-03 ENCOUNTER — Other Ambulatory Visit: Payer: Self-pay | Admitting: Student

## 2019-11-03 ENCOUNTER — Telehealth: Payer: Self-pay | Admitting: *Deleted

## 2019-11-03 MED ORDER — FUROSEMIDE 20 MG PO TABS: 20.0000 mg | ORAL_TABLET | Freq: Every day | ORAL | 0 refills | Status: DC

## 2019-11-03 NOTE — Telephone Encounter (Signed)
Refilled the Lasix script. Thank you!

## 2019-11-03 NOTE — Telephone Encounter (Signed)
Furosemide was only a 90 day supply not 6 months, needs refill

## 2019-11-04 ENCOUNTER — Telehealth: Payer: Self-pay | Admitting: Student

## 2019-11-08 DIAGNOSIS — N189 Chronic kidney disease, unspecified: Secondary | ICD-10-CM | POA: Diagnosis not present

## 2019-11-08 DIAGNOSIS — N183 Chronic kidney disease, stage 3 unspecified: Secondary | ICD-10-CM | POA: Diagnosis not present

## 2019-11-08 DIAGNOSIS — L732 Hidradenitis suppurativa: Principal | ICD-10-CM

## 2019-11-08 MED ORDER — METRONIDAZOLE 250 MG TABLET
ORAL_TABLET | Freq: Three times a day (TID) | ORAL | 2 refills | 90.00000 days
Start: 2019-11-08 — End: ?

## 2019-11-08 NOTE — Unmapped (Signed)
Patient requesting rx refill for metronidazole pended below. Last seen 04/12/2019 thanks

## 2019-11-09 MED ORDER — METRONIDAZOLE 250 MG TABLET
ORAL_TABLET | Freq: Three times a day (TID) | ORAL | 2 refills | 90.00000 days | Status: CP
Start: 2019-11-09 — End: ?

## 2019-11-25 NOTE — Unmapped (Signed)
[facility] Refill Coordination Note    Specialty Medication(s) to be Shipped:   Inflammatory Disorders: Humira    Other medication(s) to be shipped: No additional medications requested for fill at this time     Dustin Prince, DOB: 06-12-1960  Phone: [phone] (home)       All above HIPAA information was verified with patient.     Was a Nurse, learning disability used for this call? No    Completed refill call assessment today to schedule patient's medication shipment from the [facility] [phone]).       Specialty medication(s) and dose(s) confirmed: Regimen is correct and unchanged.   Changes to medications: Dustin Prince reports no changes at this time.  Changes to insurance: No  Questions for the pharmacist: No    Confirmed patient received Welcome Packet with first shipment. The patient will receive a drug information handout for each medication shipped and additional FDA Medication Guides as required.       DISEASE/MEDICATION-SPECIFIC INFORMATION        For patients on injectable medications: Patient currently has 1 doses left.  Next injection is scheduled for 11/30/2019.    SPECIALTY MEDICATION ADHERENCE     Medication Adherence    Patient reported X missed doses in the last month: 0  Specialty Medication: Humira CF 40 mg/0.4 ml  Patient is on additional specialty medications: No  Any gaps in refill history greater than 2 weeks in the last 3 months: no  Demonstrates understanding of importance of adherence: yes  Informant: patient  Reliability of informant: reliable  Support network for adherence: family member  Confirmed plan for next specialty medication refill: delivery by pharmacy  Refills needed for supportive medications: not needed                      SHIPPING     Shipping address confirmed in Epic.     Delivery Scheduled: Yes, Expected medication delivery date: 12/01/2019.     Medication will be delivered via UPS to the prescription address in Epic WAM.    Dustin Prince D Dustin Prince   [facility] Shared [facility] Specialty Technician

## 2019-11-30 MED FILL — HUMIRA PEN CITRATE FREE 40 MG/0.4 ML: 28 days supply | Qty: 4 | Fill #8

## 2019-11-30 MED FILL — HUMIRA PEN CITRATE FREE 40 MG/0.4 ML: 28 days supply | Qty: 4 | Fill #8 | Status: AC

## 2019-12-16 ENCOUNTER — Other Ambulatory Visit: Payer: Self-pay | Admitting: Infectious Diseases

## 2019-12-16 DIAGNOSIS — B20 Human immunodeficiency virus [HIV] disease: Secondary | ICD-10-CM

## 2019-12-23 NOTE — Unmapped (Signed)
[facility] Pharm[facility] Refill Coordination Note    Specialty Medication(s) to be Shipped:   Inflammatory Disorders: Humira    Other medication(s) to be shipped: No additional medications requested for fill at this time     Dustin Prince, DOB: 05-19-1960  Phone: [phone] (home)       All above HIPAA information was verified with patient.     Was a Nurse, learning disability used for this call? No    Completed refill call assessment today to schedule patient's medication shipment from the [facility] [phone]).       Specialty medication(s) and dose(s) confirmed: Regimen is correct and unchanged.   Changes to medications: Dustin Prince reports no changes at this time.  Changes to insurance: No  Questions for the pharmacist: No    Confirmed patient received Welcome Packet with first shipment. The patient will receive a drug information handout for each medication shipped and additional FDA Medication Guides as required.       DISEASE/MEDICATION-SPECIFIC INFORMATION        For patients on injectable medications: Patient currently has 1 doses left.  Next injection is scheduled for 12/28/2019.    SPECIALTY MEDICATION ADHERENCE     Medication Adherence    Patient reported X missed doses in the last month: 0  Specialty Medication: Humira CF 40 mg/0.4 ml   Patient is on additional specialty medications: No  Any gaps in refill history greater than 2 weeks in the last 3 months: no  Demonstrates understanding of importance of adherence: yes  Informant: patient  Reliability of informant: reliable  Support network for adherence: family member  Confirmed plan for next specialty medication refill: delivery by pharmacy  Refills needed for supportive medications: not needed                      SHIPPING     Shipping address confirmed in Epic.     Delivery Scheduled: Yes, Expected medication delivery date: 12/29/2019.     Medication will be delivered via UPS to the prescription address in Epic WAM.    Dustin Prince   [facility] Shared [facility] Specialty Technician

## 2019-12-28 MED FILL — HUMIRA PEN CITRATE FREE 40 MG/0.4 ML: 28 days supply | Qty: 4 | Fill #9

## 2019-12-28 MED FILL — HUMIRA PEN CITRATE FREE 40 MG/0.4 ML: 28 days supply | Qty: 4 | Fill #9 | Status: AC

## 2020-01-03 MED ORDER — LOSARTAN 50 MG TABLET
0.00000 days
Start: 2020-01-03 — End: ?

## 2020-01-06 ENCOUNTER — Ambulatory Visit (INDEPENDENT_AMBULATORY_CARE_PROVIDER_SITE_OTHER): Payer: Medicaid Other | Admitting: Student

## 2020-01-06 ENCOUNTER — Encounter: Payer: Self-pay | Admitting: Student

## 2020-01-06 ENCOUNTER — Telehealth: Payer: Self-pay | Admitting: *Deleted

## 2020-01-06 ENCOUNTER — Ambulatory Visit (HOSPITAL_COMMUNITY)
Admission: RE | Admit: 2020-01-06 | Discharge: 2020-01-06 | Disposition: A | Discharge status: Home / Self Care | Payer: Medicaid Other | Source: Ambulatory Visit | Attending: Internal Medicine | Admitting: Internal Medicine

## 2020-01-06 ENCOUNTER — Other Ambulatory Visit: Payer: Self-pay

## 2020-01-06 VITALS — BP 139/68 | HR 52 | Temp 98.1°F | Ht 72.0 in | Wt 341.5 lb

## 2020-01-06 DIAGNOSIS — R072 Precordial pain: Secondary | ICD-10-CM

## 2020-01-06 DIAGNOSIS — N183 Chronic kidney disease, stage 3 unspecified: Secondary | ICD-10-CM | POA: Diagnosis not present

## 2020-01-06 DIAGNOSIS — L732 Hidradenitis suppurativa: Secondary | ICD-10-CM

## 2020-01-06 DIAGNOSIS — R079 Chest pain, unspecified: Secondary | ICD-10-CM

## 2020-01-06 DIAGNOSIS — I129 Hypertensive chronic kidney disease with stage 1 through stage 4 chronic kidney disease, or unspecified chronic kidney disease: Secondary | ICD-10-CM

## 2020-01-06 DIAGNOSIS — R222 Localized swelling, mass and lump, trunk: Secondary | ICD-10-CM | POA: Diagnosis not present

## 2020-01-06 DIAGNOSIS — R0602 Shortness of breath: Secondary | ICD-10-CM

## 2020-01-06 DIAGNOSIS — R001 Bradycardia, unspecified: Secondary | ICD-10-CM | POA: Diagnosis not present

## 2020-01-06 DIAGNOSIS — R2233 Localized swelling, mass and lump, upper limb, bilateral: Secondary | ICD-10-CM | POA: Diagnosis not present

## 2020-01-06 DIAGNOSIS — B20 Human immunodeficiency virus [HIV] disease: Secondary | ICD-10-CM

## 2020-01-06 DIAGNOSIS — R229 Localized swelling, mass and lump, unspecified: Secondary | ICD-10-CM

## 2020-01-06 DIAGNOSIS — I10 Essential (primary) hypertension: Secondary | ICD-10-CM

## 2020-01-06 DIAGNOSIS — R2243 Localized swelling, mass and lump, lower limb, bilateral: Secondary | ICD-10-CM

## 2020-01-06 NOTE — Progress Notes (Signed)
CC: Shortness of breath and Chest Pain  HPI:  [name] is a 59 y.o. gentleman with PMHx as noted below, who presented to the clinic with chief complaint of left-sided chest pain and shortness of breath. He states that these two symptoms always occur in tandem with one another, and tend to be brought on mainly by eating and walking up stairs. He denies these symptoms ever occurring at rest or when walking around otherwise. He endorses his chest pain as a tightness, lasting about 5 to 10 minutes at a time. He states this pain is very lateral, almost in his axillary region, where he had a prior debridement for hydradenitis suppurativa. He says that these symptoms have been going on for one to two years and that he has followed with cardiology, and all of his chest pain workup has been unremarkable. He does note occasional regurgitation of food after eating and bubbles with soda. He endorses post-nasal drip, saying he clears phlegm from his throat frequently, although denies cough otherwise. He says he gets full easily and has continued to gain weight. He has an albuterol inhaler at home and says he uses this less than once weekly. He had COVID-19 in April and has since had two vaccinations. He denies any fevers, chills, light-headedness, dizziness, vomiting, abdominal pain, or any other symptoms.   Past Medical History:  Diagnosis Date  . Abscess    Between legs  . Arthritis    all over  . Asthma   . Avascular necrosis of femoral head, left ([place])   . Blood transfusion without reported diagnosis   . Cholelithiasis   . CKD (chronic kidney disease) stage 3, GFR 30-59 ml/min (HCC)    sees kidney Dr.  . Clotting disorder [facility])    left DVT  . Diverticulosis   . DVT (deep venous thrombosis) (HCC)    legs  . Dyspnea    when walking  . Dysrhythmia    remembers mother taking about having an irregular rhythm years when he was a child   . GERD (gastroesophageal reflux disease)   . HIV (human  immunodeficiency virus infection) ([place])   . Hypertension   . Morbid obesity ([place])    Review of Systems:  Positive for "lumps" felt on his thighs, stomach, and forearms. Negative for rashes or other lesions. Negative for leg swelling. All others negative except as noted above in HPI.  Physical Exam:  Vitals:   01/06/20 0842  BP: 139/68  Pulse: (!) 52  Temp: 98.1 F (36.7 C)  TempSrc: Oral  SpO2: 99%  Weight: (!) 341 lb 8 oz (154.9 kg)  Height: 6' (1.829 m)   General: Patient is morbidly obese. He appears well. No acute distress. Eyes: Sclera non-icteric. No conjunctival injection.  Respiratory: Lungs are CTA, bilaterally. No wheezes, rales, or rhonchi.  Cardiovascular: Rate is bradycardic, rhythm is regular. No murmurs, rubs, or gallops. No lower extremity edema. Abdominal: Soft and non-tender to palpation. Bowel sounds intact. No rebound or guarding.  Musculoskeletal: There is no chest wall or left axillary line tenderness to palpation.  Skin: There are multiple mobile, non-tender, 1-2" diameter masses - two in abdomen and one in forearm. There are mild chronic venous stasis changes of bilateral distal lower extremities. Otherwise, no rashes or lesions. Psych: Normal affect. Normal tone of voice.   Assessment & Plan:   See Encounters Tab for problem based charting.  Patient seen with Dr. Lucile Crater, MD 01/06/2020, 10:11 PM  Pager: [phone]

## 2020-01-06 NOTE — Telephone Encounter (Signed)
Pt called / informed of appts:    COVID screening 01/11/20@ 1020 AM at [place]     PFT's 01/14/20@ 0900 AM here at [facility]; informed to register at Admissions prior to his appt. Voice understanding

## 2020-01-06 NOTE — Patient Instructions (Addendum)
Allen Espinoza,  Today, we discussed that your shortness of breath and chest pain may be due to underlying pulmonary condition. Your EKG today showed that your heart rate was slow, around 45 beats per minute, although with no other acute concerns.   I will place a referral to a pulmonologist for pulmonary function testing to see how your lungs function. We will also check your blood counts, kidney function, and thyroid function today.  I will call you with results when they are available, in the next few days. Please plan to schedule a follow up appointment with me in 3 months, or sooner if you hear otherwise when I call with your results of your blood work and your PFT testing. You will receive a call to schedule your PFT tests soon.  Please don't hesitate to call with any questions or concerns: [phone].  Thank you,  Dr. Konrad Penta   Pulmonary Function Tests Pulmonary function tests (PFTs) are used to measure how well your lungs work, find out what is causing your lung problems, and figure out the best treatment for you. You may have PFTs:  When you have an illness involving the lungs.  To follow changes in your lung function over time if you have a chronic lung disease.  If you are an Nature conservation officer. This checks the effects of being exposed to chemicals over a long period of time.  To check lung function before having surgery or other procedures.  To check your lungs if you smoke.  To check if prescribed medicines or treatments are helping your lungs. Your results will be compared to the expected lung function of someone with healthy lungs who is similar to you in:  Age.  Gender.  Height.  Weight.  Race or ethnicity. This is done to show how your lungs compare to normal lung function (percent predicted). This is how your health care provider knows if your lung function is normal or not. If you have had PFTs done before, your health care provider will compare your  current results with past results. This shows if your lung function is better, worse, or the same as before. Tell a health care provider about:  Any allergies you have.  All medicines you are taking, including inhaler or nebulizer medicines, vitamins, herbs, eye drops, creams, and over-the-counter medicines.  Any blood disorders you have.  Any surgeries you have had, especially recent eye surgery, abdominal surgery, or chest surgery. These can make PFTs difficult or unsafe.  Any medical conditions you have, including chest pain or heart problems, tuberculosis, or respiratory infections such as pneumonia, a cold, or the flu.  Any fear of being in closed spaces (claustrophobia). Some of your tests may be in a closed space. What are the risks? Generally, this is a safe procedure. However, problems may occur, including:  Light-headedness due to over-breathing (hyperventilation).  An asthma attack from deep breathing.  A collapsed lung. What happens before the procedure?  Take over-the-counter and prescription medicines only as told by your health care provider. If you take inhaler or nebulizer medicines, ask your health care provider which medicines you should take on the day of your testing. Some inhaler medicines may interfere with PFTs if they are taken shortly before the tests.  Follow your health care provider's instructions on eating and drinking restrictions. This may include avoiding eating large meals and drinking alcohol before the testing.  Do not use any products that contain nicotine or tobacco, such as cigarettes and e-cigarettes.  If you need help quitting, ask your health care provider.  Wear comfortable clothing that will not interfere with breathing. What happens during the procedure?   You will be given a soft nose clip to wear. This is done so all of your breaths will go through your mouth instead of your nose.  You will be given a germ-free (sterile) mouthpiece. It  will be attached to a machine that measures your breathing (spirometer).  You will be asked to do various breathing maneuvers. The maneuvers will be done by breathing in (inhaling) and breathing out (exhaling). You may be asked to repeat the maneuvers several times before the testing is done.  It is important to follow the instructions exactly to get accurate results. Make sure to blow as hard and as fast as you can when you are told to do so.  You may be given a medicine that makes the small air passages in your lungs larger (bronchodilator) after testing has been done. This medicine will make it easier for you to breathe.  The tests will be repeated after the bronchodilator has taken effect.  You will be monitored carefully during the procedure for faintness, dizziness, trouble breathing, or any other problems. The procedure may vary among health care providers and hospitals. What happens after the procedure?  It is up to you to get your test results. Ask your health care provider, or the department that is doing the tests, when your results will be ready. After you have received your test results, talk with your health care provider about treatment options, if necessary. Summary  Pulmonary function tests (PFTs) are used to measure how well your lungs work, find out what is causing your lung problems, and figure out the best treatment for you.  Wear comfortable clothing that will not interfere with breathing.  It is up to you to get your test results. After you have received them, talk with your health care provider about treatment options, if necessary. This information is not intended to replace advice given to you by your health care provider. Make sure you discuss any questions you have with your health care provider. Document Revised: 03/15/2016 Document Reviewed: 02/08/2016 Elsevier Patient Education  2020 [employer].

## 2020-01-06 NOTE — Telephone Encounter (Signed)
Called from pt - stated he rides transportation and they do go to [place] (for COVID testing). Telephone number and website to [facility] testing given to pt/pt's girlfriend. Asked them to call back if unable to schedule an appt.

## 2020-01-07 ENCOUNTER — Telehealth: Payer: Self-pay | Admitting: Student

## 2020-01-07 LAB — CBC
Hematocrit: 44.4 % (ref 37.5–51.0)
Hemoglobin: 15.5 g/dL (ref 13.0–17.7)
MCH: 35.6 pg — ABNORMAL HIGH (ref 26.6–33.0)
MCHC: 34.9 g/dL (ref 31.5–35.7)
MCV: 102 fL — ABNORMAL HIGH (ref 79–97)
Platelets: 227 10*3/uL (ref 150–450)
RBC: 4.35 x10E6/uL (ref 4.14–5.80)
RDW: 11.8 % (ref 11.6–15.4)
WBC: 7.3 10*3/uL (ref 3.4–10.8)

## 2020-01-07 LAB — BMP8+ANION GAP
Anion Gap: 14 mmol/L (ref 10.0–18.0)
BUN/Creatinine Ratio: 7 — ABNORMAL LOW (ref 10–24)
BUN: 10 mg/dL (ref 8–27)
CO2: 24 mmol/L (ref 20–29)
Calcium: 9.1 mg/dL (ref 8.6–10.2)
Chloride: 102 mmol/L (ref 96–106)
Creatinine, Ser: 1.43 mg/dL — ABNORMAL HIGH (ref 0.76–1.27)
GFR calc Af Amer: 61 mL/min/{1.73_m2} (ref >59)
GFR calc non Af Amer: 52 mL/min/{1.73_m2} — ABNORMAL LOW (ref >59)
Glucose: 108 mg/dL — ABNORMAL HIGH (ref 65–99)
Potassium: 4.2 mmol/L (ref 3.5–5.2)
Sodium: 140 mmol/L (ref 134–144)

## 2020-01-07 LAB — TSH: TSH: 1.2 u[IU]/mL (ref 0.450–4.500)

## 2020-01-07 LAB — T4, FREE: Free T4: 1.42 ng/dL (ref 0.82–1.77)

## 2020-01-07 NOTE — Assessment & Plan Note (Signed)
Patient endorses chronic, lateral L-sided chest pain that has been present for 5 to 10 minutes at a time, worse with eating and drinking and walking up stairs, associated with shortness of breath over the past one to two years. He notes that he has occasional regurgitation of food and feeling full quickly. He has seen cardiology in the past and had unremarkable ECHO 03/2019 and stress test 05/2019. EKG performed today showed sinus bradycardia ~45bpm with no consecutive T wave depressions, ST segment changes, or Q waves concerning for ischemia. Cardiology do not believe his pain is cardiac in nature. 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 Patient wonders if symptoms may be due to anxiety; also could be due to underlying pulmonary condition, atypical GERD, morbid obesity, less likely gastroparesis given no obvious cause for autonomic dysfunction, N or V. - Will refer for pulmonary function testing  - Will check EKG - Will check TSH / free T4

## 2020-01-07 NOTE — Telephone Encounter (Signed)
Called patient to discuss his lab results, informing him that his TSH returned within normal limits. His Creatinine and kidney function are stable, and that his MCV continues to remain elevated although he is not anemia. Patient endorses requiring B12 shots in the past due to vitamin deficiency. Will assess B12 and folate levels at next visit as patient denies any history of or current symptoms of deficiency. Awaiting PFT testing. Patient has no questions.  Jeralyn Bennett, PGY1 Internal Medicine [phone]

## 2020-01-07 NOTE — Progress Notes (Signed)
Internal Medicine Clinic Attending  I saw and evaluated the patient.  I personally confirmed the key portions of the history and exam documented by Dr. Speakman and I reviewed pertinent patient test results.  The assessment, diagnosis, and plan were formulated together and I agree with the documentation in the resident's note.  

## 2020-01-07 NOTE — Assessment & Plan Note (Signed)
Patient continues to endorse chronic subcutaneous nodules. He has seen dermatology for his hydradenitis suppurativa who believes these are lipomas. I agree with their assessment. - Continue to monitor - Consider biopsy if concern for erythema nodosum, although patient does not have stigmata of other inflammatory, infectious, or autoimmune diseases currently

## 2020-01-07 NOTE — Assessment & Plan Note (Signed)
BP Readings from Last 3 Encounters:  01/06/20 139/68  09/22/19 139/73  07/16/19 (!) 153/72  Patient's blood pressure is slightly elevated at 139/68 on Losartan 50mg  daily (also Lasix 20mg  daily) and he may benefit from addition of antihypertensive. - Reassess next visit

## 2020-01-07 NOTE — Assessment & Plan Note (Signed)
Patient follows with ID and takes Biktarvy regularly without missing doses. - Continue regular follow up

## 2020-01-07 NOTE — Assessment & Plan Note (Signed)
Patient has a history of CKD, with last CMP documented from 03/2019 Creatinine of 1.48, around his baseline, maintaining GFR around 60. - Will check BMP - Will check CBC

## 2020-01-07 NOTE — Assessment & Plan Note (Signed)
-   Lesions are stable on Adalimumab

## 2020-01-11 ENCOUNTER — Other Ambulatory Visit (HOSPITAL_COMMUNITY): Payer: Medicaid Other

## 2020-01-11 ENCOUNTER — Other Ambulatory Visit: Payer: Medicaid Other

## 2020-01-11 DIAGNOSIS — Z20822 Contact with and (suspected) exposure to covid-19: Secondary | ICD-10-CM | POA: Diagnosis not present

## 2020-01-12 LAB — SARS-COV-2, NAA 2 DAY TAT

## 2020-01-12 LAB — NOVEL CORONAVIRUS, NAA: SARS-CoV-2, NAA: NOT DETECTED

## 2020-01-13 ENCOUNTER — Telehealth: Payer: Self-pay | Admitting: General Practice

## 2020-01-13 NOTE — Telephone Encounter (Signed)
Negative COVID results given. Patient results "NOT Detected." Caller expressed understanding. ° °

## 2020-01-14 ENCOUNTER — Other Ambulatory Visit: Payer: Self-pay

## 2020-01-14 ENCOUNTER — Other Ambulatory Visit: Payer: Self-pay | Admitting: Infectious Diseases

## 2020-01-14 ENCOUNTER — Ambulatory Visit (HOSPITAL_COMMUNITY)
Admission: RE | Admit: 2020-01-14 | Discharge: 2020-01-14 | Disposition: A | Discharge status: Home / Self Care | Payer: Medicaid Other | Source: Ambulatory Visit | Attending: Internal Medicine | Admitting: Internal Medicine

## 2020-01-14 DIAGNOSIS — B20 Human immunodeficiency virus [HIV] disease: Secondary | ICD-10-CM

## 2020-01-14 DIAGNOSIS — R0602 Shortness of breath: Secondary | ICD-10-CM | POA: Insufficient documentation

## 2020-01-14 LAB — PULMONARY FUNCTION TEST
DL/VA % pred: 109 %
DL/VA: 4.55 ml/min/mmHg/L
DLCO cor % pred: 62 %
DLCO cor: 18.12 ml/min/mmHg
DLCO unc % pred: 63 %
DLCO unc: 18.56 ml/min/mmHg
FEF 25-75 Post: 1.75 L/sec
FEF 25-75 Pre: 1.43 L/sec
FEF2575-%Change-Post: 22 %
FEF2575-%Pred-Post: 57 %
FEF2575-%Pred-Pre: 46 %
FEV1-%Change-Post: 5 %
FEV1-%Pred-Post: 59 %
FEV1-%Pred-Pre: 55 %
FEV1-Post: 1.97 L
FEV1-Pre: 1.87 L
FEV1FVC-%Change-Post: 4 %
FEV1FVC-%Pred-Pre: 96 %
FEV6-%Change-Post: 1 %
FEV6-%Pred-Post: 60 %
FEV6-%Pred-Pre: 59 %
FEV6-Post: 2.52 L
FEV6-Pre: 2.48 L
FEV6FVC-%Change-Post: 0 %
FEV6FVC-%Pred-Post: 103 %
FEV6FVC-%Pred-Pre: 103 %
FVC-%Change-Post: 0 %
FVC-%Pred-Post: 58 %
FVC-%Pred-Pre: 57 %
FVC-Post: 2.52 L
FVC-Pre: 2.49 L
Post FEV1/FVC ratio: 78 %
Post FEV6/FVC ratio: 100 %
Pre FEV1/FVC ratio: 75 %
Pre FEV6/FVC Ratio: 99 %
RV % pred: 111 %
RV: 2.65 L
TLC % pred: 71 %
TLC: 5.31 L

## 2020-01-14 MED ORDER — ALBUTEROL SULFATE (2.5 MG/3ML) 0.083% IN NEBU
2.5000 mg | INHALATION_SOLUTION | Freq: Once | RESPIRATORY_TRACT | Status: AC
  Administered 2020-01-14: 2.5 mg via RESPIRATORY_TRACT

## 2020-01-17 MED ORDER — BIKTARVY 50-200-25 MG PO TABS: 1.0000 | ORAL_TABLET | Freq: Every day | ORAL | 0 refills | Status: DC

## 2020-01-17 NOTE — Addendum Note (Signed)
Addended by: Eugenia Mcalpine on: 01/17/2020 11:04 AM   Modules accepted: Orders

## 2020-01-18 ENCOUNTER — Telehealth: Payer: Self-pay | Admitting: Student

## 2020-01-18 DIAGNOSIS — R0602 Shortness of breath: Secondary | ICD-10-CM

## 2020-01-18 NOTE — Telephone Encounter (Signed)
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 He also had moderate diffusion defect and possibly non-reversible obstructive lung disease. We discussed that I will order a high-resolution CT scan of the chest for further evaluation and to rule out nodule seen on prior CT scans. He had no further questions or concerns.  Jeralyn Bennett, PGY1 Internal Medicine [phone]

## 2020-01-18 NOTE — Unmapped (Signed)
Dustin Prince is doing well on Humira in spite of occasional flaring.  He continues to take Metronidazole.      [facility] Clinical Assessment & Refill Coordination Note    Dustin Prince, Dustin Prince: 07-Sep-1960  Phone: [phone] (home)     All above HIPAA information was verified with patient.     Was a Nurse, learning disability used for this call? No    Specialty Medication(s):   Inflammatory Disorders: Humira     Current Outpatient Medications   Medication Sig Dispense Refill   ??? albuterol HFA 90 mcg/actuation inhaler Inhale 2 puffs.     ??? bictegrav-emtricit-tenofov ala (BIKTARVY) 50-200-25 mg tablet Take 1 tablet by mouth daily.     ??? CLONAZEPAM ORAL Take by mouth.     ??? cyclobenzaprine (FLEXERIL) 5 MG tablet TAKE 1 TABLET BY MOUTH AT BEDTIME     ??? furosemide (LASIX) 20 MG tablet Take 20 mg by mouth daily.     ??? gentamicin, PF, (GARAMYCIN) 10 mg/mL Soln Take 2 vials to dermatology appointment three times a week as directed 20 mL 10   ??? HUMIRA PEN CITRATE FREE 40 MG/0.4 ML Inject the contents of 1 pen (40mg ) under the skin once weekly. 4 each 11   ??? HYDROcodone-acetaminophen (NORCO) 5-325 mg per tablet Take 1-2 tablets every 6 hours as needed for pain 15 tablet 0   ??? metroNIDAZOLE (FLAGYL) 250 MG tablet Take 1 tablet (250 mg total) by mouth Three (3) times a day. 270 tablet 2   ??? pantoprazole (PROTONIX) 40 MG tablet Take 40 mg by mouth daily.     ??? rivaroxaban (XARELTO) 20 mg tablet Take 20 mg by mouth daily with evening meal.       No current facility-administered medications for this visit.        Changes to medications: Dustin Prince reports no changes at this time.    Allergies   Allergen Reactions   ??? Sulfasalazine      Other reaction(s): Other (See Comments)  Low blood pressure   ??? Vancomycin Itching     Give with benadryl       Changes to allergies: No    SPECIALTY MEDICATION ADHERENCE     Humira 40/0.4 mg/ml: 13 days of medicine on hand       Medication Adherence    Patient reported X missed doses in the last month: 0  Specialty Medication: Humira 40 mg/0.4 ml  Patient is on additional specialty medications: No  Informant: patient  Reasons for non-adherence: no problems identified  Support network for adherence: family member  Confirmed plan for next specialty medication refill: delivery by pharmacy  Refills needed for supportive medications: not needed          Specialty medication(s) dose(s) confirmed: Regimen is correct and unchanged.     Are there any concerns with adherence? No    Adherence counseling provided? Not needed    CLINICAL MANAGEMENT AND INTERVENTION      Clinical Benefit Assessment:    Do you feel the medicine is effective or helping your condition? Yes    Clinical Benefit counseling provided? Not needed    Adverse Effects Assessment:    Are you experiencing any side effects? No    Are you experiencing difficulty administering your medicine? No    Quality of Life Assessment:    How many days over the past month did your HS  keep you from your normal activities? For example, brushing your teeth or getting  up in the morning. 0    Have you discussed this with your provider? Not needed    Therapy Appropriateness:    Is therapy appropriate? Yes, therapy is appropriate and should be continued    DISEASE/MEDICATION-SPECIFIC INFORMATION      For patients on injectable medications: Patient currently has 1 doses left.  Next injection is scheduled for 01/25/20.    PATIENT SPECIFIC NEEDS     - Does the patient have any physical, cognitive, or cultural barriers? No    - Is the patient high risk? No    - Does the patient require a Care Management Plan? No     - Does the patient require physician intervention or other additional services (i.e. nutrition, smoking cessation, social work)? No      SHIPPING     Specialty Medication(s) to be Shipped:   Inflammatory Disorders: Humira    Other medication(s) to be shipped: No additional medications requested for fill at this time     Changes to insurance: No    Delivery Scheduled: Yes, Expected medication delivery date: 01/26/20.     Medication will be delivered via UPS to the confirmed prescription address in [facility].    The patient will receive a drug information handout for each medication shipped and additional FDA Medication Guides as required.  Verified that patient has previously received a Conservation officer, historic buildings.    All of the patient's questions and concerns have been addressed.    Dustin Prince   [facility] Specialty Pharmacist

## 2020-01-18 NOTE — Unmapped (Signed)
Patient called stating he needed to schedule an ov.

## 2020-01-25 MED FILL — HUMIRA PEN CITRATE FREE 40 MG/0.4 ML: 28 days supply | Qty: 4 | Fill #10

## 2020-01-25 MED FILL — HUMIRA PEN CITRATE FREE 40 MG/0.4 ML: 28 days supply | Qty: 4 | Fill #10 | Status: AC

## 2020-01-28 NOTE — Unmapped (Signed)
Dermatology Note     Assessment and Plan:      ??  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    - Discussed considering deroofing procedure for thighs and procedure for scrotal area with Reconstructive Urology, which patient deferred to both today  - Discussed increasing Humira dose to 80 mg weekly for better control  - Reviewed role of biologic therapy including humira and remicade   - Based on discussion above and r/b/a reviewed, mutual decision to proceed with the following:  - Continue Humira 40mg  [place] weekly    - Continue metronidazole 250 mg TID. Counseled regarding risk of peripheral neuropathy with long-term use.  - Start triamcinolone (KENALOG) 0.1 % ointment; Apply topically Two (2) times a day. To affected area(s) prn for dermatitis in the gluteal cleft, appropriate use discussed    High Risk Medication Use  -Quant gold negative 05/05/2018  -Ordered repeat Quant TB Gold sent to [facility], will be checked with his other bloodwork in the next week    The patient was advised to call for an appointment should any new, changing, or symptomatic lesions develop.     RTC: Return in about 6 months (around 07/30/2020) for Follow up of HS. or sooner as needed   _________________________________________________________________      Chief Complaint     Follow up of HS    HPI     Dustin Prince is a 59 y.o. male who presents as a returning patient (last seen by Dr. Janyth Contes on 04/12/2019) to [facility] for follow up of hidradenitis suppurativa. At last visit, patient was continued on Humira 40mg  subq weekly, metronidazole 250 mg TID, and underwent intralesional gentamicin injection to 3 sites. Patient has been compliant with Humira and metronidazole as directed and and tolerating both of these well. He reports he is overall improved but is still flaring every 1-2 months with accompanying drainage and odor to the groin and perianal area, which tends to appear in one area at a time but will move around. He also endorses pruritus to an area on the buttocks.    Self-reported severity (0-5): 2  VAS pain today: 4  VAS average pain for the last month: 2  Requiring pain medication? No.  If so, what type/frequency? n/a  How often in pain?  few times a month  Level of odor (0-5): 4  Level of itching (0-5): 2  Dressing changes needed for drainage:Several times a day  How much drainage: some drainage  Flare in the last month (Y/N)? No.  How long ago was the last flare? in last 6 months  Developing new lesions? once a month  Number of inflammatory lesions montly: 1-3  DLQI: 16  Current treatment: humira     How helpful is the current treatment in managing the following aspects of your disease?  Not at all helpful Somewhat helpful Very helpful   Pain ?? ?? ??   Decreasing length of flares ?? ?? ??   Decreasing new lesions ?? ?? ??   Drainage x ?? ??   Decreasing frequency of flares x ?? ??   Decreasing severity of flares ?? ?? ??   Odor x ?? ??   ??    The patient denies any other  new or changing lesions or areas of concern.     Pertinent Past Medical History     Social History:  Current or former smoker? never  Amount smoking: n/a  How many years: n/a  ED visits in the last 5 years? 6-10  Difficulty affording medications? most of the time  Marital Status: in relationship  Living with some one? Yes.  ??  Prior treatments:  Topical: topical ABX  Systemic: clidnamycin; doxy (not very helpful),   Past surgical procedures: large WLE left axilla c/b dehisence   Past laser procedures: none  ??  ??  ROS: the balance of 10 systems is negative unless otherwise documented    Physical Examination     ??OBJECTIVE:   Gen: Well-appearing patient, appropriate, interactive, in no acute distress  Skin: Examination of the buttocks, and external genitalia performed today and pertinent for:   -Cribriform scarring of scrotum, inguinal creases, and pubic area. Larger non-draining sinus of right scrotum/perineum.  -L inguinal 1 IN, 1 draining sinus 4-5cm, 1 NDS  -R inguinal 2 NDS  -perineum/scrotum with 3 DS, 1 IN. At least 50% of scrotum involved.    -All other areas not specifically commented on are within normal limits    Scribe's Attestation: [name], MD obtained and performed the history, physical exam and medical decision making elements that were entered into the chart.  Signed by Minette Brine, Scribe, on January 31, 2020 10:23 AM    ----------------------------------------------------------------------------------------------------------------------  January 31, 2020 12:37 PM. Documentation assistance provided by the Scribe. I was present during the time the encounter was recorded. The information recorded by the Scribe was done at my direction and has been reviewed and validated by me.  ----------------------------------------------------------------------------------------------------------------------       (Approved Template 12/13/2019)

## 2020-01-31 ENCOUNTER — Encounter: Admit: 2020-01-31 | Discharge: 2020-02-01 | Payer: BLUE CROSS/BLUE SHIELD

## 2020-01-31 DIAGNOSIS — L732 Hidradenitis suppurativa: Secondary | ICD-10-CM | POA: Diagnosis not present

## 2020-01-31 DIAGNOSIS — Z79899 Other long term (current) drug therapy: Secondary | ICD-10-CM | POA: Diagnosis not present

## 2020-01-31 MED ORDER — TRIAMCINOLONE ACETONIDE 0.1 % TOPICAL OINTMENT
Freq: Two times a day (BID) | TOPICAL | 1 refills | 0.00000 days | Status: CP
Start: 2020-01-31 — End: 2021-01-30

## 2020-01-31 MED ORDER — METRONIDAZOLE 250 MG TABLET
ORAL_TABLET | Freq: Three times a day (TID) | ORAL | 2 refills | 90.00000 days | Status: CP
Start: 2020-01-31 — End: ?

## 2020-01-31 MED ORDER — HUMIRA PEN CITRATE FREE 40 MG/0.4 ML
SUBCUTANEOUS | 11 refills | 0.00000 days | Status: CP
Start: 2020-01-31 — End: 2021-01-30
  Filled 2020-02-21: qty 4, 28d supply, fill #0

## 2020-02-01 ENCOUNTER — Other Ambulatory Visit (HOSPITAL_COMMUNITY)
Admission: RE | Admit: 2020-02-01 | Discharge: 2020-02-01 | Disposition: A | Discharge status: Home / Self Care | Payer: Medicaid Other | Source: Ambulatory Visit | Attending: Infectious Diseases | Admitting: Infectious Diseases

## 2020-02-01 ENCOUNTER — Other Ambulatory Visit: Payer: Medicaid Other

## 2020-02-01 ENCOUNTER — Other Ambulatory Visit: Payer: Self-pay

## 2020-02-01 DIAGNOSIS — B20 Human immunodeficiency virus [HIV] disease: Secondary | ICD-10-CM | POA: Diagnosis not present

## 2020-02-01 DIAGNOSIS — Z113 Encounter for screening for infections with a predominantly sexual mode of transmission: Secondary | ICD-10-CM

## 2020-02-01 DIAGNOSIS — Z79899 Other long term (current) drug therapy: Secondary | ICD-10-CM | POA: Diagnosis not present

## 2020-02-01 LAB — URINALYSIS
Bilirubin Urine: NEGATIVE
Glucose, UA: NEGATIVE
Hgb urine dipstick: NEGATIVE
Nitrite: NEGATIVE
Specific Gravity, Urine: 1.026 (ref 1.001–1.03)
pH: 5.5 (ref 5.0–8.0)

## 2020-02-02 ENCOUNTER — Other Ambulatory Visit: Payer: Self-pay

## 2020-02-02 DIAGNOSIS — I82409 Acute embolism and thrombosis of unspecified deep veins of unspecified lower extremity: Secondary | ICD-10-CM

## 2020-02-02 DIAGNOSIS — I825Z2 Chronic embolism and thrombosis of unspecified deep veins of left distal lower extremity: Secondary | ICD-10-CM

## 2020-02-02 LAB — URINE CYTOLOGY ANCILLARY ONLY
Chlamydia: NEGATIVE
Comment: NEGATIVE
Comment: NORMAL
Neisseria Gonorrhea: NEGATIVE

## 2020-02-02 LAB — T-HELPER CELL (CD4) - (RCID CLINIC ONLY)
CD4 % Helper T Cell: 19 % — ABNORMAL LOW (ref 33–65)
CD4 T Cell Abs: 345 /uL — ABNORMAL LOW (ref 400–1790)

## 2020-02-02 MED ORDER — RIVAROXABAN 20 MG PO TABS: 20.0000 mg | ORAL_TABLET | Freq: Every day | ORAL | 0 refills | Status: DC

## 2020-02-02 NOTE — Telephone Encounter (Signed)
Need refill on rivaroxaban (XARELTO) 20 MG TABS tablet  ;pt contact [place]

## 2020-02-04 LAB — HIV-1 RNA QUANT-NO REFLEX-BLD
HIV 1 RNA Quant: <20 Copies/mL
HIV-1 RNA Quant, Log: <1.3 Log cps/mL

## 2020-02-04 LAB — RPR: RPR Ser Ql: NONREACTIVE

## 2020-02-05 ENCOUNTER — Other Ambulatory Visit: Payer: Self-pay | Admitting: Internal Medicine

## 2020-02-07 DIAGNOSIS — Z79899 Other long term (current) drug therapy: Secondary | ICD-10-CM | POA: Diagnosis not present

## 2020-02-07 DIAGNOSIS — N189 Chronic kidney disease, unspecified: Secondary | ICD-10-CM | POA: Diagnosis not present

## 2020-02-13 ENCOUNTER — Other Ambulatory Visit: Payer: Self-pay | Admitting: Infectious Diseases

## 2020-02-13 DIAGNOSIS — B20 Human immunodeficiency virus [HIV] disease: Secondary | ICD-10-CM

## 2020-02-14 NOTE — Telephone Encounter (Signed)
Pt has appt on 11/16

## 2020-02-15 ENCOUNTER — Ambulatory Visit (INDEPENDENT_AMBULATORY_CARE_PROVIDER_SITE_OTHER): Payer: Medicaid Other | Admitting: Infectious Diseases

## 2020-02-15 ENCOUNTER — Telehealth: Payer: Self-pay

## 2020-02-15 ENCOUNTER — Other Ambulatory Visit: Payer: Self-pay

## 2020-02-15 ENCOUNTER — Encounter: Payer: Self-pay | Admitting: Infectious Diseases

## 2020-02-15 DIAGNOSIS — I10 Essential (primary) hypertension: Secondary | ICD-10-CM | POA: Diagnosis not present

## 2020-02-15 DIAGNOSIS — L732 Hidradenitis suppurativa: Secondary | ICD-10-CM | POA: Diagnosis not present

## 2020-02-15 DIAGNOSIS — B20 Human immunodeficiency virus [HIV] disease: Secondary | ICD-10-CM

## 2020-02-15 MED ORDER — BIKTARVY 50-200-25 MG PO TABS: 1.0000 | ORAL_TABLET | Freq: Every day | ORAL | 11 refills | Status: DC

## 2020-02-15 NOTE — Assessment & Plan Note (Signed)
Remains suppressed on Biktarvy once daily. Doing very well. Discussed Cabenuva injections with him including medication routine, side effects and administration. He would like to be considered for these to manage HIV. Will route note to our ID pharmacist. He would be willing to go offsite to injection clinic to get this medication if that is required.   Vaccination counseling discussed at today's visit. Updated as outlined per recommendations for [facility] at today's visit. He has received Pfizer but not sure the dates - will send a copy of his card when he gets home. Flu shot given for this season.  Last colon cancer screening: 07-2017 Depression screening discussed today - no needs at this time.  Patient is receiving dental care through private dentist. No needs identified today.  STI screening offered today. Condoms provided.   He can follow up in 6 months or sooner if we plan to switch his regimen. More information provided to him today. Labs prior to visit per his request. Will check Quantiferon next draw given HIV + immune modulator therapy.

## 2020-02-15 NOTE — Patient Instructions (Addendum)
Nice to see you today!  Your blood pressure is a little high today - I would like to see that lower than 130/80. Make sure you take your blood pressure medication before your next doctor visit so we can see how it is doing on your medication. Your kidneys will need this under better control to be healthy!  Continue your Phillips Odor, will put you on our list for the Cabenuva injections.    CABENUVA is the new medication to treat HIV. It is an injection in each butt cheek every 30 days at this time.   We would start with a pill "lead in" period for you where you take 2 pills once a day with food for 28 days to make sure you tolerate them well before we do the long acting injection.   Largely the side effects were of course related to the injection itself, but during the studies where this was used to treat HIV patients there were only a very small (< 5) number of patients that stopped it due to pain from the shots. Most of the patients in the trials reported overall improvement in the pain associated with the shots over time.   Patient Information Link: https://gskpro.com/content/dam/global/hcpportal/en_US/Prescribing_Information/Cabenuva/pdf/CABENUVA-PI-PIL-[email or web address]-IFU3.PDF#page=36   Will plan to see you back in 6 months with labs prior to your visit.

## 2020-02-15 NOTE — Assessment & Plan Note (Signed)
Under good control on immune modulator. No flares since starting. Continues with dermatology care.

## 2020-02-15 NOTE — Assessment & Plan Note (Signed)
He has been out of his medications for a few days waiting to pick everything up at once. Discussed importance of HTN control to prevent further deterioration of kidneys.  Recent UA with trace protein and ketones. No GU symptoms, no trouble with swelling of legs.  He follows with [place] [facility].

## 2020-02-15 NOTE — Progress Notes (Signed)
Name: Allen Espinoza  DOB: 1960/12/08 MRN: [id number] PCP: Jeralyn Bennett, MD    Patient Active Problem List   Diagnosis Date Noted  . Subcutaneous nodules 03/01/2019  . Essential hypertension 10/19/2018  . Sleep disorder 03/05/2018  . 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 Morbid (severe) obesity due to excess calories ([place]) 08/19/2016  . Solitary pulmonary nodule 05/06/2016  . Hidradenitis suppurativa 04/27/2016  . Chest pain 04/26/2016  . Avascular necrosis of bone of hip, left s/p TRH ([place]) 03/19/2016  . Human immunodeficiency virus (HIV) disease ([place]) 03/18/2016  . Chronic kidney disease 03/18/2016  . Cholelithiasis 03/18/2016  . Diverticulosis of colon without hemorrhage 03/18/2016     Brief Narrative:  Allen Espinoza is a 59 y.o. male with HIV infection. Originally diagnosed in 2007 in [place].  History of OIs: none.  HIV Risk: heterosexual   07/2016 Quantiferon (-)  Previous Regimens: Jorje Guild + Prezista >> suppressed (changed due to DDI with Xarelto) . Biktarvy >> suppressed   Genotypes: . No mutations   Subjective:   Chief Complaint  Patient presents with  . Follow-up    declined condoms       HPI/ROS:  [name] is doing very well. Continues on biktarvy once a day with no concern for lapses in medication or side effects. He is very interested in [place] injections for managing HIV and would like more information if he is a candidate for this.   Has not had any trouble with hidradenitis flares since continuing his immune modulating medication as prescribed by [facility] D[facility]at[facility] team. No changes in his health otherwise. Just "needs to get some weight off." Has a hard time exercising with low back pain and shortness of breath.     Review of Systems  Constitutional: Negative for appetite change, chills, fatigue, fever and unexpected weight change.  Eyes: Negative for visual disturbance.    Respiratory: Positive for shortness of breath. Negative for cough.   Cardiovascular: Positive for chest pain. Negative for leg swelling.  Gastrointestinal: Negative for abdominal pain, diarrhea and nausea.  Genitourinary: Negative for discharge, dysuria and genital sores.  Musculoskeletal: Negative for joint swelling.  Skin: Negative for color change and rash.  Neurological: Negative for dizziness and headaches.  Hematological: Negative for adenopathy.  Psychiatric/Behavioral: Negative for sleep disturbance. The patient is not nervous/anxious.      Past Medical History:  Diagnosis Date  . Abscess    Between legs  . Arthritis    all over  . Asthma   . Avascular necrosis of femoral head, left ([place])   . Blood transfusion without reported diagnosis   . Cholelithiasis   . CKD (chronic kidney disease) stage 3, GFR 30-59 ml/min (HCC)    sees kidney Dr.  . Clotting disorder [facility])    left DVT  . Diverticulosis   . DVT (deep venous thrombosis) (HCC)    legs  . Dyspnea    when walking  . Dysrhythmia    remembers mother taking about having an irregular rhythm years when he was a child   . GERD (gastroesophageal reflux disease)   . HIV (human immunodeficiency virus infection) ([place])   . Hypertension   . Morbid obesity ([place])     Outpatient Medications Prior to Visit  Medication Sig Dispense Refill  . Adalimumab 40 MG/0.4ML PNKT INJECT THE CONTENTS OF 1 PEN (40MG ) UNDER THE SKIN ONCE EACH WEEK    . albuterol (PROVENTIL HFA;VENTOLIN  HFA) 108 (90 Base) MCG/ACT inhaler Inhale 2 puffs into the lungs daily as needed for wheezing or shortness of breath.    . furosemide (LASIX) 20 MG tablet Take 1 tablet by mouth once daily 90 tablet 0  . losartan (COZAAR) 50 MG tablet Take 50 mg by mouth daily.    . metroNIDAZOLE (FLAGYL) 250 MG tablet Take 250 mg by mouth daily.    . rivaroxaban (XARELTO) 20 MG TABS tablet Take 1 tablet (20 mg total) by mouth daily with supper. 90 tablet 0  .  triamcinolone ointment (KENALOG) 0.1 % Apply topically.    . bictegravir-emtricitabine-tenofovir AF (BIKTARVY) 50-200-25 MG TABS tablet Take 1 tablet by mouth daily. 30 tablet 0  . Cholecalciferol (VITAMIN D) 50 MCG (2000 UT) CAPS Take 1 capsule by mouth. (Patient not taking: Reported on 02/15/2020)    . vitamin B-12 (CYANOCOBALAMIN) 100 MCG tablet Take 100 mcg by mouth daily. (Patient not taking: Reported on 02/15/2020)     No facility-administered medications prior to visit.     Allergies  Allergen Reactions  . Sulfa Antibiotics Other (See Comments)    Low blood pressure  . Vancomycin Itching    Give with benadryl    Social History   Tobacco Use  . Smoking status: Never Smoker  . Smokeless tobacco: Never Used  Vaping Use  . Vaping Use: Never used  Substance Use Topics  . Alcohol use: No    Comment: prior  . Drug use: No    Comment: prior cocaine use, last 2005    Social History   Substance and Sexual Activity  Sexual Activity Yes  . Partners: Female  . Birth control/protection: Condom     Objective:   Vitals:   02/15/20 0944  BP: (!) 175/90  Pulse: 98  Temp: 97.7 F (36.5 C)  TempSrc: Oral  SpO2: 98%  Weight: (!) 348 lb (157.9 kg)  Height: 6' (1.829 m)   Body mass index is 47.2 kg/m.  Physical Exam Constitutional:      Appearance: Normal appearance. He is not ill-appearing.  HENT:     Head: Normocephalic.     Mouth/Throat:     Mouth: Mucous membranes are moist.     Pharynx: Oropharynx is clear.  Eyes:     General: No scleral icterus. Pulmonary:     Effort: Pulmonary effort is normal.  Musculoskeletal:        General: Normal range of motion.     Cervical back: Normal range of motion.  Skin:    Coloration: Skin is not jaundiced or pale.  Neurological:     Mental Status: He is alert and oriented to person, place, and time.  Psychiatric:        Mood and Affect: Mood normal.        Judgment: Judgment normal.     Lab Results Lab Results   Component Value Date   WBC 7.3 01/06/2020   HGB 15.5 01/06/2020   HCT 44.4 01/06/2020   MCV 102 (H) 01/06/2020   PLT 227 01/06/2020    Lab Results  Component Value Date   CREATININE 1.43 (H) 01/06/2020   BUN 10 01/06/2020   NA 140 01/06/2020   K 4.2 01/06/2020   CL 102 01/06/2020   CO2 24 01/06/2020    Lab Results  Component Value Date   ALT 12 03/03/2019   AST 15 03/03/2019   ALKPHOS 83 06/16/2018   BILITOT 0.3 03/03/2019    Lab Results  Component Value Date  CHOL 162 08/18/2018   HDL 60 08/18/2018   LDLCALC 81 08/18/2018   TRIG 114 08/18/2018   CHOLHDL 2.7 08/18/2018   HIV 1 RNA Quant  Date Value  02/01/2020 <20 Copies/mL  03/03/2019 <20 NOT DETECTED copies/mL  08/18/2018 <20 NOT DETECTED copies/mL   HIV-1 RNA Viral Load (copies/mL)  Date Value  03/18/2016 47,300   CD4 T Cell Abs (/uL)  Date Value  02/01/2020 345 (L)  03/03/2019 401  08/18/2018 300 (L)    Assessment & Plan:   Problem List Items Addressed This Visit      Unprioritized   Human immunodeficiency virus (HIV) disease ([place]) (Chronic)    Remains suppressed on Biktarvy once daily. Doing very well. Discussed Cabenuva injections with him including medication routine, side effects and administration. He would like to be considered for these to manage HIV. Will route note to our ID pharmacist. He would be willing to go offsite to injection clinic to get this medication if that is required.   Vaccination counseling discussed at today's visit. Updated as outlined per recommendations for [facility]WH at today's visit. He has received Pfizer but not sure the dates - will send a copy of his card when he gets home. Flu shot given for this season.  Last colon cancer screening: 07-2017 Depression screening discussed today - no needs at this time.  Patient is receiving dental care through private dentist. No needs identified today.  STI screening offered today. Condoms provided.   He can follow up in 6 months or  sooner if we plan to switch his regimen. More information provided to him today. Labs prior to visit per his request. Will check Quantiferon next draw given HIV + immune modulator therapy.        Relevant Medications   bictegravir-emtricitabine-tenofovir AF (BIKTARVY) 50-200-25 MG TABS tablet   Other Relevant Orders   HIV-1 RNA quant-no reflex-bld   T-helper cell (CD4)- ([facility] clinic only)   COMPLETE METABOLIC PANEL WITH GFR   CBC with Differential/Platelet   Lipid panel   QuantiFERON-TB Gold Plus   Hidradenitis suppurativa    Under good control on immune modulator. No flares since starting. Continues with dermatology care.       Relevant Medications   bictegravir-emtricitabine-tenofovir AF (BIKTARVY) 50-200-25 MG TABS tablet   Essential hypertension    He has been out of his medications for a few days waiting to pick everything up at once. Discussed importance of HTN control to prevent further deterioration of kidneys.  Recent UA with trace protein and ketones. No GU symptoms, no trouble with swelling of legs.  He follows with [place] [facility].          Janene Madeira, MSN, NP-C [facility] for Infectious [place] Pager: [phone] Office: [phone]  02/15/20  2:22 PM

## 2020-02-15 NOTE — Telephone Encounter (Signed)
Patient called to triage to update vaccine information Per patient on  On 01/12/19 [facility]   Attempted to call [facility] to verify information, but they are currently closed for lunch until 2 pm. Will follow up with pharmacy once they reopen to confirm vaccine information.   Allen Espinoza

## 2020-02-15 NOTE — Telephone Encounter (Signed)
Spoke with Reya at [facility] to obtain recent immunization records. Patient received flu vaccine 01/12/2019 and Moderna COVID vaccines on 10/19/2019 and 11/21/2019. RN will update immunization record accordingly. Reya unable to provide lot numbers.   Beryle Flock, RN

## 2020-02-17 NOTE — Unmapped (Signed)
[facility] Refill Coordination Note    Specialty Medication(s) to be Shipped:   Inflammatory Disorders: Humira    Other medication(s) to be shipped: No additional medications requested for fill at this time     Dustin Prince, DOB: 12-Nov-1960  Phone: [phone] (home)       All above HIPAA information was verified with patient.     Was a Nurse, learning disability used for this call? No    Completed refill call assessment today to schedule patient's medication shipment from the [facility] [phone]).       Specialty medication(s) and dose(s) confirmed: Regimen is correct and unchanged.   Changes to medications: Dustin Prince reports starting the following medications: Triamcinolone ointment  Changes to insurance: No  Questions for the pharmacist: No    Confirmed patient received Welcome Packet with first shipment. The patient will receive a drug information handout for each medication shipped and additional FDA Medication Guides as required.       DISEASE/MEDICATION-SPECIFIC INFORMATION        For patients on injectable medications: Patient currently has 1 doses left.  Next injection is scheduled for 02/22/2020.    SPECIALTY MEDICATION ADHERENCE     Medication Adherence    Patient reported X missed doses in the last month: 0  Specialty Medication: Humira CF 40 mg/0.4 ml   Patient is on additional specialty medications: No  Any gaps in refill history greater than 2 weeks in the last 3 months: no  Demonstrates understanding of importance of adherence: yes  Informant: patient  Reliability of informant: reliable  Support network for adherence: family member  Confirmed plan for next specialty medication refill: delivery by pharmacy  Refills needed for supportive medications: not needed                      SHIPPING     Shipping address confirmed in Epic.     Delivery Scheduled: Yes, Expected medication delivery date: 02/22/2020.     Medication will be delivered via UPS to the prescription address in Epic WAM.    Dustin Prince   [facility] Specialty Technician

## 2020-02-21 MED FILL — HUMIRA PEN CITRATE FREE 40 MG/0.4 ML: 28 days supply | Qty: 4 | Fill #0 | Status: AC

## 2020-03-08 ENCOUNTER — Ambulatory Visit (HOSPITAL_COMMUNITY): Payer: Medicaid Other

## 2020-03-14 ENCOUNTER — Telehealth: Payer: Self-pay | Admitting: Pharmacist

## 2020-03-14 DIAGNOSIS — R0789 Other chest pain: Secondary | ICD-10-CM | POA: Diagnosis not present

## 2020-03-14 DIAGNOSIS — L732 Hidradenitis suppurativa: Secondary | ICD-10-CM | POA: Diagnosis not present

## 2020-03-14 DIAGNOSIS — B20 Human immunodeficiency virus [HIV] disease: Secondary | ICD-10-CM

## 2020-03-14 DIAGNOSIS — I129 Hypertensive chronic kidney disease with stage 1 through stage 4 chronic kidney disease, or unspecified chronic kidney disease: Secondary | ICD-10-CM | POA: Diagnosis not present

## 2020-03-14 DIAGNOSIS — N183 Chronic kidney disease, stage 3 unspecified: Secondary | ICD-10-CM | POA: Diagnosis not present

## 2020-03-14 DIAGNOSIS — I5189 Other ill-defined heart diseases: Secondary | ICD-10-CM | POA: Diagnosis not present

## 2020-03-14 DIAGNOSIS — I82409 Acute embolism and thrombosis of unspecified deep veins of unspecified lower extremity: Secondary | ICD-10-CM | POA: Diagnosis not present

## 2020-03-14 DIAGNOSIS — M87052 Idiopathic aseptic necrosis of left femur: Secondary | ICD-10-CM | POA: Diagnosis not present

## 2020-03-14 MED ORDER — EDURANT 25 MG PO TABS: 25.0000 mg | ORAL_TABLET | Freq: Every day | ORAL | 0 refills | Status: DC

## 2020-03-14 MED ORDER — VOCABRIA 30 MG PO TABS: 1.0000 | ORAL_TABLET | Freq: Every day | ORAL | 0 refills | Status: DC

## 2020-03-14 NOTE — Telephone Encounter (Signed)
Called patient to discuss Cabenuva injections. He will come in next Wednesday 12/22 to start oral lead-in therapy. Initial injection appointment and subsequent appointments will be made at that visit.   Sent in oral lead-in scripts to [facility]. They should arrive to clinic in 2 days.

## 2020-03-16 NOTE — Unmapped (Signed)
[facility] Refill Coordination Note    Specialty Medication(s) to be Shipped:   Inflammatory Disorders: Humira    Other medication(s) to be shipped: No additional medications requested for fill at this time     Dustin Prince, DOB: 19-Apr-1960  Phone: [phone] (home)       All above HIPAA information was verified with patient.     Was a Nurse, learning disability used for this call? No    Completed refill call assessment today to schedule patient's medication shipment from the [facility] [phone]).       Specialty medication(s) and dose(s) confirmed: Regimen is correct and unchanged.   Changes to medications: Dustin Prince reports no changes at this time.  Changes to insurance: No  Questions for the pharmacist: No    Confirmed patient received Welcome Packet with first shipment. The patient will receive a drug information handout for each medication shipped and additional FDA Medication Guides as required.       DISEASE/MEDICATION-SPECIFIC INFORMATION        For patients on injectable medications: Patient currently has 1 doses left.  Next injection is scheduled for 03/21/2020.    SPECIALTY MEDICATION ADHERENCE     Medication Adherence    Patient reported X missed doses in the last month: 0  Specialty Medication: Humira CF 40 mg/0.4 ml  Patient is on additional specialty medications: No  Any gaps in refill history greater than 2 weeks in the last 3 months: no  Demonstrates understanding of importance of adherence: yes  Informant: patient  Reliability of informant: reliable  Support network for adherence: family member  Confirmed plan for next specialty medication refill: delivery by pharmacy  Refills needed for supportive medications: not needed                      SHIPPING     Shipping address confirmed in Epic.     Delivery Scheduled: Yes, Expected medication delivery date: 03/21/2020.     Medication will be delivered via UPS to the prescription address in Epic WAM.    Dustin Prince Dustin Prince   [facility] Specialty Technician

## 2020-03-20 MED FILL — HUMIRA PEN CITRATE FREE 40 MG/0.4 ML: 28 days supply | Qty: 4 | Fill #1 | Status: AC

## 2020-03-20 MED FILL — HUMIRA PEN CITRATE FREE 40 MG/0.4 ML: 28 days supply | Qty: 4 | Fill #1

## 2020-03-21 ENCOUNTER — Ambulatory Visit (HOSPITAL_COMMUNITY): Payer: Medicaid Other

## 2020-03-22 ENCOUNTER — Other Ambulatory Visit: Payer: Self-pay | Admitting: Pharmacist

## 2020-03-22 ENCOUNTER — Ambulatory Visit (INDEPENDENT_AMBULATORY_CARE_PROVIDER_SITE_OTHER): Payer: Medicaid Other | Admitting: Pharmacist

## 2020-03-22 ENCOUNTER — Other Ambulatory Visit: Payer: Self-pay

## 2020-03-22 DIAGNOSIS — B20 Human immunodeficiency virus [HIV] disease: Secondary | ICD-10-CM

## 2020-03-22 MED ORDER — CABOTEGRAVIR & RILPIVIRINE ER 600 & 900 MG/3ML IM SUER: 1.0000 | Freq: Once | INTRAMUSCULAR | 0 refills | Status: DC

## 2020-03-22 NOTE — Progress Notes (Signed)
HPI: Allen Espinoza is a 59 y.o. male who presents to the [facility] for HIV follow-up.  Patient Active Problem List   Diagnosis Date Noted  . Subcutaneous nodules 03/01/2019  . Essential hypertension 10/19/2018  . Sleep disorder 03/05/2018  . 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 Morbid (severe) obesity due to excess calories ([place]) 08/19/2016  . Solitary pulmonary nodule 05/06/2016  . Hidradenitis suppurativa 04/27/2016  . Chest pain 04/26/2016  . Avascular necrosis of bone of hip, left s/p TRH ([place]) 03/19/2016  . Human immunodeficiency virus (HIV) disease ([place]) 03/18/2016  . Chronic kidney disease 03/18/2016  . Cholelithiasis 03/18/2016  . Diverticulosis of colon without hemorrhage 03/18/2016    Patient's Medications  New Prescriptions   No medications on file  Previous Medications   ADALIMUMAB 40 MG/0.4ML PNKT    INJECT THE CONTENTS OF 1 PEN (40MG ) UNDER THE SKIN ONCE EACH WEEK   ALBUTEROL (PROVENTIL HFA;VENTOLIN HFA) 108 (90 BASE) MCG/ACT INHALER    Inhale 2 puffs into the lungs daily as needed for wheezing or shortness of breath.   BICTEGRAVIR-EMTRICITABINE-TENOFOVIR AF (BIKTARVY) 50-200-25 MG TABS TABLET    Take 1 tablet by mouth daily.   CABOTEGRAVIR SODIUM (VOCABRIA) 30 MG TABS    Take 1 tablet by mouth daily.   CHOLECALCIFEROL (VITAMIN D) 50 MCG (2000 UT) CAPS    Take 1 capsule by mouth.   FUROSEMIDE (LASIX) 20 MG TABLET    Take 1 tablet by mouth once daily   LOSARTAN (COZAAR) 50 MG TABLET    Take 50 mg by mouth daily.   METRONIDAZOLE (FLAGYL) 250 MG TABLET    Take 250 mg by mouth daily.   RILPIVIRINE (EDURANT) 25 MG TABS TABLET    Take 1 tablet (25 mg total) by mouth daily with breakfast.   RIVAROXABAN (XARELTO) 20 MG TABS TABLET    Take 1 tablet (20 mg total) by mouth daily with supper.   TRIAMCINOLONE OINTMENT (KENALOG) 0.1 %    Apply topically.   VITAMIN B-12 (CYANOCOBALAMIN) 100 MCG TABLET    Take 100  mcg by mouth daily.  Modified Medications   No medications on file  Discontinued Medications   No medications on file    Allergies: Allergies  Allergen Reactions  . Sulfa Antibiotics Other (See Comments)    Low blood pressure  . Vancomycin Itching    Give with benadryl    Past Medical History: Past Medical History:  Diagnosis Date  . Abscess    Between legs  . Arthritis    all over  . Asthma   . Avascular necrosis of femoral head, left ([place])   . Blood transfusion without reported diagnosis   . Cholelithiasis   . CKD (chronic kidney disease) stage 3, GFR 30-59 ml/min (HCC)    sees kidney Dr.  . Clotting disorder [facility])    left DVT  . Diverticulosis   . DVT (deep venous thrombosis) (HCC)    legs  . Dyspnea    when walking  . Dysrhythmia    remembers mother taking about having an irregular rhythm years when he was a child   . GERD (gastroesophageal reflux disease)   . HIV (human immunodeficiency virus infection) ([place])   . Hypertension   . Morbid obesity ([place])     Social History: Social History   Socioeconomic History  . Marital status: Single    Spouse name: Not on file  . Number  of children: 2  . Years of education: Not on file  . Highest education level: Not on file  Occupational History  . Occupation: Disability  Tobacco Use  . Smoking status: Never Smoker  . Smokeless tobacco: Never Used  Vaping Use  . Vaping Use: Never used  Substance and Sexual Activity  . Alcohol use: No    Comment: prior  . Drug use: No    Comment: prior cocaine use, last 2005  . Sexual activity: Yes    Partners: Female    Birth control/protection: Condom  Other Topics Concern  . 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, mailroom, warehouse work   Lives w/ fiancee   No EtOH, tobacco, drugs   Social Determinants of Health   Financial Resource Strain: Not on file  Food Insecurity: Not on file  Transportation Needs: Not on file  Physical  Activity: Not on file  Stress: Not on file  Social Connections: Not on file    Labs: Lab Results  Component Value Date   HIV1RNAQUANT <20 02/01/2020   HIV1RNAQUANT <20 NOT DETECTED 03/03/2019   HIV1RNAQUANT <20 NOT DETECTED 08/18/2018   HIV1RNAVL 47,300 03/18/2016   CD4TABS 345 (L) 02/01/2020   CD4TABS 401 03/03/2019   CD4TABS 300 (L) 08/18/2018    RPR and STI Lab Results  Component Value Date   LABRPR NON-REACTIVE 02/01/2020   LABRPR NON-REACTIVE 08/18/2018   LABRPR NON-REACTIVE 05/01/2017   LABRPR NON REAC 08/07/2016    STI Results GC CT  02/01/2020 Negative Negative  03/03/2019 Negative Negative  05/01/2017 Negative Negative  08/07/2016 Negative Negative  04/26/2016 Negative Negative    Hepatitis B Lab Results  Component Value Date   HEPBSAB REACTIVE (A) 11/19/2016   HEPBSAG NON-REACTIVE 11/19/2016   Hepatitis C No results found for: HEPCAB, HCVRNAPCRQN Hepatitis A No results found for: HAV Lipids: Lab Results  Component Value Date   CHOL 162 08/18/2018   TRIG 114 08/18/2018   HDL 60 08/18/2018   CHOLHDL 2.7 08/18/2018   VLDL 16 08/07/2016   [place] 81 08/18/2018    Current HIV Regimen: Biktarvy  Assessment: Nakota is here today to initiate [place].  Discussed the process of starting Cabenuva injections including the need to take oral lead-in therapy for 28-30 days to assess tolerability to cabotegravir. Counseled patient to take one tablet of rilpivirine + one tablet of cabotegravir once daily WITH food. Encouraged patient to take the tablets around the same time each day and to make sure it is with a meal. Advised to never take one without the other. Discussed possible side effects such as nausea, headache, insomnia, and abnormal dreams. Explained the importance of not missing any doses of the 30 day regimen to make sure no resistance develops. Explained to patient that we will set a target date each month to administer injections. Explained the  importance of showing up to appointments and not missing any monthly injections. Warned that if patient misses 2 appointments, it will be reassessed by their provider whether they are a good candidate for injection therapy. Made appointment for patient to come in and receive initial injection 28-30 days after starting oral lead-in therapy.  Counseled that [place] is two separate intramuscular injections in the gluteal muscle on each side for each monthly visit. Cautioned on possible side effects such as injection-site reactions, fatigue, headache, nasuea, rash, and dizziness.  Will make patient's follow up appointments for 6 months to help with compliance. Answered all questions.  Gave patient my card to call me with any issues.  Plan: - Stop Biktarvy - Start [place] + Edurant once daily with food x 30 days - Initial injection appointment scheduled with Colletta Maryland and myself on 04/20/20 at 930am - Follow up injection appointments are scheduled through June - Call with issues  Johnross Nabozny L. Clarke Amburn, PharmD, BCIDP, AAHIVP, CPP Clinical Pharmacist Practitioner Infectious Diseases [place] for Infectious Disease 03/22/2020, 9:17 AM

## 2020-04-07 ENCOUNTER — Telehealth: Payer: Self-pay | Admitting: Pharmacist

## 2020-04-07 MED FILL — CABENUVA 600 & 900 MG/3ML S: 600 & 900 | 28 days supply | Qty: 6 | Fill #0

## 2020-04-07 NOTE — Telephone Encounter (Signed)
Allen Espinoza will be delivered to clinic on 04/10/20 for his appointment on 04/20/20.

## 2020-04-10 ENCOUNTER — Telehealth: Payer: Self-pay

## 2020-04-10 NOTE — Telephone Encounter (Signed)
[facility] Patient Advocate Encounter  Patient's medication [name] have been couriered to [facility] from [facility] and will be administered at his next appointment on 04/20/20.  [name] , [facility] Patient [facility] for Infectious Disease Phone: [phone] Fax:  [phone]

## 2020-04-12 NOTE — Unmapped (Signed)
[facility] Refill Coordination Note    Specialty Medication(s) to be Shipped:   Inflammatory Disorders: Humira    Other medication(s) to be shipped: No additional medications requested for fill at this time     [name], DOB: 1960/06/24  Phone: [phone] (home)       All above HIPAA information was verified with patient.     Was a Nurse, learning disability used for this call? No    Completed refill call assessment today to schedule patient's medication shipment from the [facility] [phone]).       Specialty medication(s) and dose(s) confirmed: Regimen is correct and unchanged.   Changes to medications: Dyllon reports no changes at this time.  Changes to insurance: No  Questions for the pharmacist: No    Confirmed patient received Welcome Packet with first shipment. The patient will receive a drug information handout for each medication shipped and additional FDA Medication Guides as required.       DISEASE/MEDICATION-SPECIFIC INFORMATION        For patients on injectable medications: Patient currently has 1 doses left.  Next injection is scheduled for 01/18.    SPECIALTY MEDICATION ADHERENCE     Medication Adherence    Patient reported X missed doses in the last month: 0  Specialty Medication: humira 40mg /0.59ml  Patient is on additional specialty medications: No  Patient is on more than two specialty medications: No  Any gaps in refill history greater than 2 weeks in the last 3 months: no  Demonstrates understanding of importance of adherence: yes  Informant: patient  Reliability of informant: reliable  Provider-estimated medication adherence level: good  Patient is at risk for Non-Adherence: No  Support network for adherence: family member                  humira 40/0.4 mg/ml: 7 days of medicine on hand       SHIPPING     Shipping address confirmed in Epic.     Delivery Scheduled: Yes, Expected medication delivery date: 01/20.     Medication will be delivered via UPS to the prescription address in Epic WAM.    Antonietta Barcelona   [facility] Specialty Technician

## 2020-04-19 MED FILL — HUMIRA PEN CITRATE FREE 40 MG/0.4 ML: 28 days supply | Qty: 4 | Fill #2

## 2020-04-20 ENCOUNTER — Other Ambulatory Visit: Payer: Self-pay

## 2020-04-20 ENCOUNTER — Encounter: Payer: Self-pay | Admitting: Infectious Diseases

## 2020-04-20 ENCOUNTER — Ambulatory Visit (INDEPENDENT_AMBULATORY_CARE_PROVIDER_SITE_OTHER): Payer: Medicaid Other | Admitting: Infectious Diseases

## 2020-04-20 ENCOUNTER — Ambulatory Visit: Payer: Medicaid Other | Admitting: Pharmacist

## 2020-04-20 VITALS — BP 145/82 | HR 52 | Temp 97.9°F | Ht 72.0 in | Wt 340.0 lb

## 2020-04-20 DIAGNOSIS — B20 Human immunodeficiency virus [HIV] disease: Secondary | ICD-10-CM

## 2020-04-20 MED ORDER — CABOTEGRAVIR & RILPIVIRINE ER 600 & 900 MG/3ML IM SUER
1.0000 | Freq: Once | INTRAMUSCULAR | Status: AC
  Administered 2020-04-20: 1 via INTRAMUSCULAR

## 2020-04-20 MED ORDER — CABOTEGRAVIR & RILPIVIRINE ER 400 & 600 MG/2ML IM SUER: 1.0000 | INTRAMUSCULAR | 11 refills | Status: DC

## 2020-04-20 NOTE — Assessment & Plan Note (Addendum)
Transitioning to injectable Cabenuva with loading injection today. Tolerated both injections well without any concern for side effects. Monitored x 15 minutes. Counseled for subsequent injections and method/location of the shots.  All questions answered to his satisfaction.  Stop oral lead in now.  VL in 1 month at return visit for maintenance dose injection.

## 2020-04-20 NOTE — Progress Notes (Signed)
Patient tolerated Cabenuva well. Ordered maintenance dose and sent to [facility].

## 2020-04-20 NOTE — Progress Notes (Signed)
Subjective:    Patient ID: Allen Espinoza, male    DOB: 10-29-1960, 60 y.o.   MRN: [id number]  CC:  Cabenuva injection - First Injection visit    HPI:  Allen Espinoza is a 60 y.o. male with well controlled HIV disease here for monthly Cabenuva injection visit.   He did very well with the oral lead in and took it correctly with food everyday. No missed doses noted. He had no side effects to declare.   He does have a few questions about method and location of the injection, how we monitor his condition and whether he should take the pills anymore from today on.   He is down 8 lbs today and very happy about that loss. Trying to make small changes to keep him in good health.     Allergies  Allergen Reactions  . Sulfa Antibiotics Other (See Comments)    Low blood pressure  . Vancomycin Itching    Give with benadryl      Outpatient Medications Prior to Visit  Medication Sig Dispense Refill  . Adalimumab 40 MG/0.4ML PNKT INJECT THE CONTENTS OF 1 PEN (40MG) UNDER THE SKIN ONCE EACH WEEK    . albuterol (PROVENTIL HFA;VENTOLIN HFA) 108 (90 Base) MCG/ACT inhaler Inhale 2 puffs into the lungs daily as needed for wheezing or shortness of breath.    Tyler Aas & rilpivirine ER (CABENUVA) 600 & 900 MG/3ML injection Inject 1 kit into the muscle once for 1 dose. 6 mL 0  . Cabotegravir Sodium (VOCABRIA) 30 MG TABS Take 1 tablet by mouth daily. 30 tablet 0  . Cholecalciferol (VITAMIN D) 50 MCG (2000 UT) CAPS Take 1 capsule by mouth. (Patient not taking: Reported on 02/15/2020)    . furosemide (LASIX) 20 MG tablet Take 1 tablet by mouth once daily 90 tablet 0  . losartan (COZAAR) 50 MG tablet Take 50 mg by mouth daily.    . metroNIDAZOLE (FLAGYL) 250 MG tablet Take 250 mg by mouth daily.    . rilpivirine (EDURANT) 25 MG TABS tablet Take 1 tablet (25 mg total) by mouth daily with breakfast. 30 tablet 0  . rivaroxaban (XARELTO) 20 MG TABS tablet Take 1 tablet (20 mg total) by mouth daily with  supper. 90 tablet 0  . triamcinolone ointment (KENALOG) 0.1 % Apply topically.    . vitamin B-12 (CYANOCOBALAMIN) 100 MCG tablet Take 100 mcg by mouth daily. (Patient not taking: Reported on 02/15/2020)     No facility-administered medications prior to visit.    Past Medical History:  Diagnosis Date  . Abscess    Between legs  . Arthritis    all over  . Asthma   . Avascular necrosis of femoral head, left ([place])   . Blood transfusion without reported diagnosis   . Cholelithiasis   . CKD (chronic kidney disease) stage 3, GFR 30-59 ml/min (HCC)    sees kidney Dr.  . Clotting disorder [facility])    left DVT  . Diverticulosis   . DVT (deep venous thrombosis) (HCC)    legs  . Dyspnea    when walking  . Dysrhythmia    remembers mother taking about having an irregular rhythm years when he was a child   . GERD (gastroesophageal reflux disease)   . HIV (human immunodeficiency virus infection) ([place])   . Hypertension   . Morbid obesity (HCC)          Objective:    BP (!) 145/82   Pulse Marland Kitchen)  52   Temp 97.9 F (36.6 C) (Oral)   Ht 6' (1.829 m)   Wt (!) 340 lb (154.2 kg)   SpO2 96%   BMI 46.11 kg/m  Nursing note and vital signs reviewed.  Physical Exam Constitutional:      Appearance: Normal appearance. He is not ill-appearing.  HENT:     Head: Normocephalic.     Mouth/Throat:     Mouth: Mucous membranes are moist.     Pharynx: Oropharynx is clear.  Eyes:     General: No scleral icterus. Pulmonary:     Effort: Pulmonary effort is normal.  Musculoskeletal:        General: Normal range of motion.     Cervical back: Normal range of motion.  Skin:    Coloration: Skin is not jaundiced or pale.  Neurological:     Mental Status: He is alert and oriented to person, place, and time.  Psychiatric:        Mood and Affect: Mood normal.        Judgment: Judgment normal.     Assessment & Plan:   Patient Active Problem List   Diagnosis Date Noted  . Subcutaneous nodules  03/01/2019  . Essential hypertension 10/19/2018  . Sleep disorder 03/05/2018  . 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 ([place]) 08/19/2016  . Morbid (severe) obesity due to excess calories ([place]) 08/19/2016  . Solitary pulmonary nodule 05/06/2016  . Hidradenitis suppurativa 04/27/2016  . Chest pain 04/26/2016  . Avascular necrosis of bone of hip, left s/p TRH ([place]) 03/19/2016  . Human immunodeficiency virus (HIV) disease ([place]) 03/18/2016  . Chronic kidney disease 03/18/2016  . Cholelithiasis 03/18/2016  . Diverticulosis of colon without hemorrhage 03/18/2016    Problem List Items Addressed This Visit      Unprioritized   Human immunodeficiency virus (HIV) disease ([place]) - Primary (Chronic)    Transitioning to injectable Cabenuva with loading injection today. Tolerated both injections well without any concern for side effects. Monitored x 15 minutes. Counseled for subsequent injections and method/location of the shots.  All questions answered to his satisfaction.  Stop oral lead in now.  VL in 1 month at return visit for maintenance dose injection.       Relevant Medications   cabotegravir & rilpivirine ER (CABENUVA) 400 & 600 MG/2ML injection        Meds ordered this encounter  Medications  . cabotegravir & rilpivirine ER (CABENUVA) 400 & 600 MG/2ML injection    Sig: Inject 1 kit into the muscle every 30 (thirty) days.    Dispense:  4 mL    Refill:  11    Order Specific Question:   Supervising Provider    Answer:   Thayer Headings [phone]  . cabotegravir & rilpivirine ER (CABENUVA) 600 & 900 MG/3ML injection 1 kit     Follow-up: RTC in 30 days for maintenance injection. Appt has been scheduled.   I spent 15 minutes with the patient in direct counseling and monitoring following administration of medication.    Janene Madeira, MSN, NP-C [facility] for Infectious Disease [email or web address] Pager: [phone] Office: [phone] [place]: [phone]

## 2020-04-20 NOTE — Patient Instructions (Addendum)
You did great with your injection!   You can stop the pills now and continue only on the shots for HIV care.   Your next appointment has been scheduled in 1 month. We will give your next injection and check your labs at that visit.   If you experience any knots under the skin please use a warm compress to help.    Moving around today is a good idea, if you sit too much it hurts more.

## 2020-04-21 ENCOUNTER — Encounter: Payer: Medicaid Other | Admitting: Infectious Diseases

## 2020-05-01 ENCOUNTER — Other Ambulatory Visit: Payer: Self-pay | Admitting: Student in an Organized Health Care Education/Training Program

## 2020-05-01 DIAGNOSIS — I825Z2 Chronic embolism and thrombosis of unspecified deep veins of left distal lower extremity: Secondary | ICD-10-CM

## 2020-05-01 DIAGNOSIS — I82409 Acute embolism and thrombosis of unspecified deep veins of unspecified lower extremity: Secondary | ICD-10-CM

## 2020-05-08 DIAGNOSIS — N183 Chronic kidney disease, stage 3 unspecified: Secondary | ICD-10-CM | POA: Diagnosis not present

## 2020-05-08 MED FILL — CABENUVA 400 & 600 MG/2ML S: 400 & 600 | 30 days supply | Qty: 4 | Fill #0

## 2020-05-09 ENCOUNTER — Telehealth: Payer: Self-pay

## 2020-05-09 NOTE — Telephone Encounter (Signed)
[facility] Patient Advocate Encounter  Patient's medication(Cabenuva) have been couriered to [facility] from [place] and will be administered on next appointment on 05/19/20.  [name] , [facility] Patient [facility] for Infectious Disease Phone: [phone] Fax:  [phone]

## 2020-05-10 NOTE — Unmapped (Signed)
[facility] Refill Coordination Note    Specialty Medication(s) to be Shipped:   Inflammatory Disorders: Humira    Other medication(s) to be shipped: No additional medications requested for fill at this time     Dustin Prince, DOB: March 28, 1961  Phone: [phone] (home)       All above HIPAA information was verified with patient.     Was a Nurse, learning disability used for this call? No    Completed refill call assessment today to schedule patient's medication shipment from the [facility] [phone]).       Specialty medication(s) and dose(s) confirmed: Regimen is correct and unchanged.   Changes to medications: Dustin Prince reports no changes at this time.  Changes to insurance: No  Questions for the pharmacist: No    Confirmed patient received Welcome Packet with first shipment. The patient will receive a drug information handout for each medication shipped and additional FDA Medication Guides as required.       DISEASE/MEDICATION-SPECIFIC INFORMATION        For patients on injectable medications: Patient currently has 1 doses left.  Next injection is scheduled for 05/16/2020.    SPECIALTY MEDICATION ADHERENCE     Medication Adherence    Patient reported X missed doses in the last month: 0  Specialty Medication: Humira CF 40 mg/0.4 ml  Patient is on additional specialty medications: No  Any gaps in refill history greater than 2 weeks in the last 3 months: no  Demonstrates understanding of importance of adherence: yes  Informant: patient  Reliability of informant: reliable  Support network for adherence: family member  Confirmed plan for next specialty medication refill: delivery by pharmacy  Refills needed for supportive medications: not needed                      SHIPPING     Shipping address confirmed in Epic.     Delivery Scheduled: Yes, Expected medication delivery date: 05/12/2020.     Medication will be delivered via UPS to the prescription address in Epic WAM.    Dustin Prince   [facility] Specialty Technician

## 2020-05-11 MED FILL — HUMIRA PEN CITRATE FREE 40 MG/0.4 ML: 28 days supply | Qty: 4 | Fill #3

## 2020-05-12 ENCOUNTER — Other Ambulatory Visit: Payer: Self-pay | Admitting: Internal Medicine

## 2020-05-19 ENCOUNTER — Encounter: Payer: Self-pay | Admitting: Infectious Diseases

## 2020-05-19 ENCOUNTER — Other Ambulatory Visit: Payer: Self-pay

## 2020-05-19 ENCOUNTER — Ambulatory Visit (INDEPENDENT_AMBULATORY_CARE_PROVIDER_SITE_OTHER): Payer: Medicaid Other | Admitting: Infectious Diseases

## 2020-05-19 ENCOUNTER — Other Ambulatory Visit: Payer: Self-pay | Admitting: Infectious Diseases

## 2020-05-19 VITALS — BP 149/86 | HR 52 | Temp 98.3°F | Wt 343.0 lb

## 2020-05-19 DIAGNOSIS — B20 Human immunodeficiency virus [HIV] disease: Secondary | ICD-10-CM | POA: Diagnosis not present

## 2020-05-19 MED ORDER — CABOTEGRAVIR & RILPIVIRINE ER 400 & 600 MG/2ML IM SUER
1.0000 | Freq: Once | INTRAMUSCULAR | Status: AC
  Administered 2020-05-19: 1 via INTRAMUSCULAR

## 2020-05-19 MED ORDER — CABENUVA 600 & 900 MG/3ML IM SUER
1.0000 | INTRAMUSCULAR | 6 refills | Status: DC
Start: 2020-06-16 — End: 2020-06-16

## 2020-05-19 NOTE — Patient Instructions (Signed)
We gave you your injection of CABENUVA for treatment today.   Your next appointment has been scheduled in 1 month on 06/16/2020. We will work on every 2 months after that.     Care Tips:  If you experience any knots under the skin please use a warm compress to help.    Some people experience some redness at the site of the injection. This can be normal and should go away soon.   Moving around today is a good idea, if you sit too much it hurts more.   Please call the office to speak with our pharmacy or triage team if you have any questions or concerns.

## 2020-05-19 NOTE — Assessment & Plan Note (Signed)
Tolerated initial injection well with minimal side effects r/t injection. Will proceed with the monthly maintenance dose today. We discussed future treatment options and will plan to transition to 600-900 mg dose for q60m interval at his next appointment.  Viral load for therapeutic monitoring today.   Next cabenuva injection scheduled for 06/16/2020 - 600-900mg  dose.

## 2020-05-19 NOTE — Progress Notes (Signed)
Subjective:    Patient ID: Allen Espinoza, male    DOB: Feb 17, 1961, 60 y.o.   MRN: [id number]  CC:  Cabenuva injection - monthly injection #2   HPI:  Allen Espinoza is a 60 y.o. male with well controlled HIV disease here for his monthly injection of Cabenuva.   Did well with first loading dose injection. He had some mild soreness for about 3 days when he sat down but did very well otherwise. He has really enjoyed not needing to take daily HIV medication.   He has no concerns today.    Review of Systems    Allergies  Allergen Reactions  . Sulfa Antibiotics Other (See Comments)    Low blood pressure  . Vancomycin Itching    Give with benadryl      Outpatient Medications Prior to Visit  Medication Sig Dispense Refill  . albuterol (PROVENTIL HFA;VENTOLIN HFA) 108 (90 Base) MCG/ACT inhaler Inhale 2 puffs into the lungs daily as needed for wheezing or shortness of breath.    . Cholecalciferol (VITAMIN D) 50 MCG (2000 UT) CAPS Take 1 capsule by mouth.    . furosemide (LASIX) 20 MG tablet Take 1 tablet by mouth once daily 90 tablet 0  . losartan (COZAAR) 50 MG tablet Take 50 mg by mouth daily.    Marland Kitchen triamcinolone ointment (KENALOG) 0.1 % Apply topically.    . vitamin B-12 (CYANOCOBALAMIN) 100 MCG tablet Take 100 mcg by mouth daily.    Alveda Reasons 20 MG TABS tablet TAKE 1 TABLET BY MOUTH ONCE DAILY WITH SUPPER 90 tablet 0  . cabotegravir & rilpivirine ER (CABENUVA) 400 & 600 MG/2ML injection Inject 1 kit into the muscle every 30 (thirty) days. 4 mL 11  . Adalimumab 40 MG/0.4ML PNKT INJECT THE CONTENTS OF 1 PEN (40MG) UNDER THE SKIN ONCE EACH WEEK    . metroNIDAZOLE (FLAGYL) 250 MG tablet Take 250 mg by mouth daily. (Patient not taking: Reported on 05/19/2020)     No facility-administered medications prior to visit.     Past Medical History:  Diagnosis Date  . Abscess    Between legs  . Arthritis    all over  . Asthma   . Avascular necrosis of femoral head, left ([place])   .  Blood transfusion without reported diagnosis   . Cholelithiasis   . CKD (chronic kidney disease) stage 3, GFR 30-59 ml/min (HCC)    sees kidney Dr.  . Clotting disorder [facility])    left DVT  . Diverticulosis   . DVT (deep venous thrombosis) (HCC)    legs  . Dyspnea    when walking  . Dysrhythmia    remembers mother taking about having an irregular rhythm years when he was a child   . GERD (gastroesophageal reflux disease)   . HIV (human immunodeficiency virus infection) ([place])   . Hypertension   . Morbid obesity ([place])       Past Surgical History:  Procedure Laterality Date  . COLONOSCOPY  2019  . DENTAL SURGERY     had teeth pulled  . HYDRADENITIS EXCISION Left 10/14/2016   Procedure: WIDE EXCISION HIDRADENITIS LEFT AXILLA;  Surgeon: Coralie Keens, MD;  Location: [place];  Service: General;  Laterality: Left;  . JOINT REPLACEMENT     Left hip Dr. Ninfa Linden 11/29/16  . TOTAL HIP ARTHROPLASTY Left 11/29/2016   Procedure: LEFT TOTAL HIP ARTHROPLASTY ANTERIOR APPROACH;  Surgeon: [name], MD;  [facility];  Service: Orthopedics;  Laterality: Left;         Objective:    BP (!) 149/86   Pulse (!) 52   Temp 98.3 F (36.8 C) (Oral)   Wt (!) 343 lb (155.6 kg)   BMI 46.52 kg/m  Nursing note and vital signs reviewed.  Physical Exam - no physical exam done       Assessment & Plan:   Patient Active Problem List   Diagnosis Date Noted  . Subcutaneous nodules 03/01/2019  . Essential hypertension 10/19/2018  . Sleep disorder 03/05/2018  . 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 ([place]) 08/19/2016  . Morbid (severe) obesity due to excess calories ([place]) 08/19/2016  . Solitary pulmonary nodule 05/06/2016  . Hidradenitis suppurativa 04/27/2016  . Chest pain 04/26/2016  . Avascular necrosis of bone of hip, left s/p TRH ([place]) 03/19/2016  . Human immunodeficiency virus (HIV) disease ([place]) 03/18/2016  . Chronic kidney disease  03/18/2016  . Cholelithiasis 03/18/2016  . Diverticulosis of colon without hemorrhage 03/18/2016    Problem List Items Addressed This Visit      Unprioritized   Human immunodeficiency virus (HIV) disease ([place]) - Primary (Chronic)    Tolerated initial injection well with minimal side effects r/t injection. Will proceed with the monthly maintenance dose today. We discussed future treatment options and will plan to transition to 600-900 mg dose for q79minterval at his next appointment.  Viral load for therapeutic monitoring today.   Next cabenuva injection scheduled for 06/16/2020 - 600-9051mdose.        Relevant Medications   cabotegravir & rilpivirine ER (CABENUVA) 600 & 900 MG/3ML injection (Start on 06/16/2020)   Other Relevant Orders   HIV-1 RNA quant-no reflex-bld      Meds ordered this encounter  Medications  . cabotegravir & rilpivirine ER (CABENUVA) 400 & 600 MG/2ML injection 1 kit  . cabotegravir & rilpivirine ER (CABENUVA) 600 & 900 MG/3ML injection    Sig: Inject 1 kit into the muscle every 8 (eight) weeks.    Dispense:  6 mL    Refill:  6    Patient will be transitioning to q2m45mjection interval at higher dose 600-900 mg. This will start at return visit in 1 month. Thank you    Order Specific Question:   Supervising Provider    Answer:   COMThayer Headings4[[id number]]  Follow-up: RTC in 30 days for maintenance injection. Appt has been scheduled.   I spent 15 minutes with the patient in counsel regarding different dose intervals and monitoring related to future management on cabenuva.     SteJanene MadeiraSN, NP-C [facility] Infectious Disease [email or web address] Pager: [phone]: [phone]

## 2020-05-22 LAB — HIV-1 RNA QUANT-NO REFLEX-BLD
HIV 1 RNA Quant: <20 Copies/mL
HIV-1 RNA Quant, Log: <1.3 Log cps/mL

## 2020-06-02 DIAGNOSIS — L732 Hidradenitis suppurativa: Principal | ICD-10-CM

## 2020-06-02 NOTE — Unmapped (Signed)
[facility] Refill Coordination Note    Specialty Medication(s) to be Shipped:   Inflammatory Disorders: Humira    Other medication(s) to be shipped: No additional medications requested for fill at this time     Dustin Prince, DOB: [birth year]  Phone: [phone] (home)       All above HIPAA information was verified with patient.     Was a Nurse, learning disability used for this call? No    Completed refill call assessment today to schedule patient's medication shipment from the [facility] [phone]).       Specialty medication(s) and dose(s) confirmed: Regimen is correct and unchanged.   Changes to medications: Dustin Prince reports no changes at this time.  Changes to insurance: No  Questions for the pharmacist: No    Confirmed patient received Welcome Packet with first shipment. The patient will receive a drug information handout for each medication shipped and additional FDA Medication Guides as required.       DISEASE/MEDICATION-SPECIFIC INFORMATION        For patients on injectable medications: Patient currently has 2 doses left.  Next injection is scheduled for 06/06/2020.    SPECIALTY MEDICATION ADHERENCE     Medication Adherence    Patient reported X missed doses in the last month: 0  Specialty Medication: Humira CF 40 mg/0.4 ml   Patient is on additional specialty medications: No  Any gaps in refill history greater than 2 weeks in the last 3 months: no  Demonstrates understanding of importance of adherence: yes  Informant: patient  Reliability of informant: reliable  Support network for adherence: family member  Confirmed plan for next specialty medication refill: delivery by pharmacy  Refills needed for supportive medications: not needed                      SHIPPING     Shipping address confirmed in Epic.     Delivery Scheduled: Yes, Expected medication delivery date: 06/08/2020.     Medication will be delivered via UPS to the prescription address in Epic WAM.    Dustin Prince   [facility] Shared [facility] Specialty Technician

## 2020-06-07 MED FILL — HUMIRA PEN CITRATE FREE 40 MG/0.4 ML: 28 days supply | Qty: 4 | Fill #4

## 2020-06-09 MED FILL — CABENUVA 600 & 900 MG/3ML S: 600 & 900 | 34 days supply | Qty: 6 | Fill #0

## 2020-06-12 ENCOUNTER — Telehealth: Payer: Self-pay

## 2020-06-12 NOTE — Telephone Encounter (Signed)
[facility] Patient Advocate Encounter  Patient's medication(Cabenuva) have been couriered to [facility] from [place] and will be administered on 06/16/20 on patient next appointment.  Allen Espinoza , [facility] Patient [facility] for Infectious Disease Phone: [phone] Fax:  [phone]

## 2020-06-16 ENCOUNTER — Other Ambulatory Visit: Payer: Self-pay

## 2020-06-16 ENCOUNTER — Encounter: Payer: Self-pay | Admitting: Infectious Diseases

## 2020-06-16 ENCOUNTER — Ambulatory Visit (INDEPENDENT_AMBULATORY_CARE_PROVIDER_SITE_OTHER): Payer: Medicaid Other | Admitting: Infectious Diseases

## 2020-06-16 DIAGNOSIS — B20 Human immunodeficiency virus [HIV] disease: Secondary | ICD-10-CM | POA: Diagnosis not present

## 2020-06-16 MED ORDER — CABOTEGRAVIR & RILPIVIRINE ER 600 & 900 MG/3ML IM SUER
1.0000 | Freq: Once | INTRAMUSCULAR | Status: AC
  Administered 2020-06-16: 1 via INTRAMUSCULAR

## 2020-06-16 NOTE — Addendum Note (Signed)
Addended by: Daisy Floro T on: 06/16/2020 09:21 AM   Modules accepted: Orders

## 2020-06-16 NOTE — Progress Notes (Signed)
Subjective:    Patient ID: Allen Espinoza, male    DOB: 03/07/1961, 60 y.o.   MRN: [id number]    Brief Narrative:  Originally diagnosed in 2007 in [place].  History of OIs: none.  HIV Risk: heterosexual   07/2016 Quantiferon (-)  Previous Regimens:  Genvoya + Prezista >> suppressed (changed due to DDI with Xarelto)  Biktarvy >> suppressed   Cabenuva Q51m2022     CC:  Cabenuva injection  Happy to increase to every 2 months for treatment     HPI:  Allen GALYEANis a 60y.o. male with well controlled HIV disease here for his maintenance injection of Cabenuva.   No concerns for any injection site reactions. He has been very happy with the treatments thus far and plans to continue these. He has no concerns today. Feels like he lost another few pounds, which he is actively working towards.    Review of Systems  Constitutional: Negative for appetite change, chills, fatigue, fever and unexpected weight change.  Eyes: Negative for visual disturbance.  Respiratory: Negative for cough and shortness of breath.   Cardiovascular: Negative for chest pain and leg swelling.  Gastrointestinal: Negative for abdominal pain, diarrhea and nausea.  Genitourinary: Negative for dysuria, genital sores and penile discharge.  Musculoskeletal: Negative for joint swelling.  Skin: Negative for color change and rash.  Neurological: Negative for dizziness and headaches.  Hematological: Negative for adenopathy.  Psychiatric/Behavioral: Negative for sleep disturbance. The patient is not nervous/anxious.       Allergies  Allergen Reactions  . Sulfa Antibiotics Other (See Comments)    Low blood pressure  . Vancomycin Itching    Give with benadryl      Outpatient Medications Prior to Visit  Medication Sig Dispense Refill  . albuterol (PROVENTIL HFA;VENTOLIN HFA) 108 (90 Base) MCG/ACT inhaler Inhale 2 puffs into the lungs daily as needed for wheezing or shortness of breath.    .Tyler Aas& rilpivirine ER (CABENUVA) 600 & 900 MG/3ML injection Inject 1 kit into the muscle every 8 (eight) weeks. 6 mL 6  . Cholecalciferol (VITAMIN D) 50 MCG (2000 UT) CAPS Take 1 capsule by mouth.    . furosemide (LASIX) 20 MG tablet Take 1 tablet by mouth once daily 90 tablet 0  . losartan (COZAAR) 50 MG tablet Take 50 mg by mouth daily.    . metroNIDAZOLE (FLAGYL) 250 MG tablet Take 250 mg by mouth daily.    .Marland Kitchentriamcinolone ointment (KENALOG) 0.1 % Apply topically.    . vitamin B-12 (CYANOCOBALAMIN) 100 MCG tablet Take 100 mcg by mouth daily.    .Alveda Reasons20 MG TABS tablet TAKE 1 TABLET BY MOUTH ONCE DAILY WITH SUPPER 90 tablet 0  . Adalimumab 40 MG/0.4ML PNKT INJECT THE CONTENTS OF 1 PEN (40MG) UNDER THE SKIN ONCE EACH WEEK     No facility-administered medications prior to visit.     Past Medical History:  Diagnosis Date  . Abscess    Between legs  . Arthritis    all over  . Asthma   . Av[place] necrosis of femoral head, left ([place]   . Blood transfusion without reported diagnosis   . Cholelithiasis   . CKD (chronic kidney disease) stage 3, GFR 30-59 ml/min (HCC)    sees kidney Dr.  . Clotting disorder ([facility]    left DVT  . Diverticulosis   . DVT (deep venous thrombosis) (HCC)    legs  . Dyspnea  when walking  . Dysrhythmia    remembers mother taking about having an irregular rhythm years when he was a child   . GERD (gastroesophageal reflux disease)   . HIV (human immunodeficiency virus infection) ([place])   . Hypertension   . Morbid obesity ([place])       Past Surgical History:  Procedure Laterality Date  . COLONOSCOPY  2019  . DENTAL SURGERY     had teeth pulled  . HYDRADENITIS EXCISION Left 10/14/2016   Procedure: WIDE EXCISION HIDRADENITIS LEFT AXILLA;  Surgeon: Coralie Keens, MD;  Location: [place];  Service: General;  Laterality: Left;  . JOINT REPLACEMENT     Left hip Dr. Ninfa Linden 11/29/16  . TOTAL HIP ARTHROPLASTY Left 11/29/2016   Procedure: LEFT  TOTAL HIP ARTHROPLASTY ANTERIOR APPROACH;  Surgeon: [name], MD;  [facility];  Service: Orthopedics;  Laterality: Left;         Objective:    BP (!) 143/78   Pulse (!) 55   Temp 97.9 F (36.6 C) (Oral)   Wt (!) 342 lb (155.1 kg)   BMI 46.38 kg/m     Physical Exam - no physical exam done       Assessment & Plan:   Patient Active Problem List   Diagnosis Date Noted  . Subcutaneous nodules 03/01/2019  . Essential hypertension 10/19/2018  . Sleep disorder 03/05/2018  . 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 Morbid (severe) obesity due to excess calories ([place]) 08/19/2016  . Solitary pulmonary nodule 05/06/2016  . Hidradenitis suppurativa 04/27/2016  . Chest pain 04/26/2016  . Avascular necrosis of bone of hip, left s/p TRH ([place]) 03/19/2016  . Human immunodeficiency virus (HIV) disease ([place]) 03/18/2016  . Chronic kidney disease 03/18/2016  . Cholelithiasis 03/18/2016  . Diverticulosis of colon without hemorrhage 03/18/2016    Problem List Items Addressed This Visit      Unprioritized   Human immunodeficiency virus (HIV) disease ([place]) (Chronic)    Doing well with injections of Cabenuva. Tolerating well with only minor injection site reactions. All injections have been administered at appropriate treatment intervals. Today he received the 600-900 mg dose kit for Q32mmaintenance schedule.   Will continue with Q226miral load monitoring during first 6 months for therapeutic treatment monitoring.  CD4 count is due at next visit as well.   Last VL result:  HIV 1 RNA Quant (Copies/mL)  Date Value  05/19/2020 <20    Dose Interval: Q2m72mxt Appointment: 08/17/2020            Allen MadeiraSN, NP-C [facility] Infectious Disease [email or web address] Pager: [phone]: [phone]

## 2020-06-16 NOTE — Assessment & Plan Note (Signed)
Doing well with injections of Cabenuva. Tolerating well with only minor injection site reactions. All injections have been administered at appropriate treatment intervals. Today he received the 600-900 mg dose kit for Q67mmaintenance schedule.   Will continue with Q250miral load monitoring during first 6 months for therapeutic treatment monitoring.  CD4 count is due at next visit as well.   Last VL result:  HIV 1 RNA Quant (Copies/mL)  Date Value  05/19/2020 <20    Dose Interval: Q2m60mxt Appointment: 08/17/2020

## 2020-06-16 NOTE — Patient Instructions (Signed)
We gave you your injection of CABENUVA for treatment today.   Your next injection appointment has been scheduled:  08/17/2020    Helpful Tips:  If you experience any knots under the skin please use a warm compress to help.    Ice may also be helpful if swelling present.   Some people experience some redness at the site of the injection. This can be normal and should go away soon.   Moving around today is a good idea, if you sit too much it hurts more.   Please call the office to speak with our pharmacy or triage team if you have any questions or concerns.

## 2020-06-27 ENCOUNTER — Other Ambulatory Visit (HOSPITAL_COMMUNITY): Payer: Self-pay

## 2020-07-06 DIAGNOSIS — L732 Hidradenitis suppurativa: Principal | ICD-10-CM

## 2020-07-06 MED ORDER — ADALIMUMAB 80 MG/0.8 ML SUBCUTANEOUS PEN KIT
SUBCUTANEOUS | 11 refills | 0.00000 days | Status: CP
Start: 2020-07-06 — End: ?
  Filled 2020-07-12: qty 4, 28d supply, fill #0

## 2020-07-06 NOTE — Unmapped (Addendum)
Update 4/12: I informed Dustin Prince that his higher dose of Humira (80 mg) had been approved. We cancelled delivery scheduled below, and instead will send 80 mg pens for delivery on Thurs, 4/13 (UPS, prescription address). We reviewed to hold the pen down for 5 more seconds and to watch for any new adverse effects. The dosing day will remain on Tues./ mas    -----------------------------------------------  Dustin Prince reports his HS continues to flare on Humira - he has bad flares with pain and swelling about once per month around scrotum and waistline, and resultant drainage that occurs daily. He is continuing to take Metronidazole, but isn't sure if it helps.    I'll tag his dermatologist to see if it may be appropriate to consider a dose increase to 80mg /week (mentioned in last note in Nov. Patient called for earliest appointment, and scheduled for July).     He asked about injecting into his HS flare for better effect - I counseled AGAINST this since the absorption of medication may be altered.     Dustin Prince Clinical Assessment & Refill Coordination Note    Dustin Prince, Dustin Prince: April 23, 1960  Phone: [phone] (home)     All above HIPAA information was verified with patient.     Was a Nurse, learning disability used for this call? No    Specialty Medication(s):   Inflammatory Disorders: Humira     Current Outpatient Medications   Medication Sig Dispense Refill   ??? albuterol HFA 90 mcg/actuation inhaler Inhale 2 puffs.     ??? bictegrav-emtricit-tenofov ala (BIKTARVY) 50-200-25 mg tablet Take 1 tablet by mouth daily.     ??? CLONAZEPAM ORAL Take by mouth.     ??? cyclobenzaprine (FLEXERIL) 5 MG tablet TAKE 1 TABLET BY MOUTH AT BEDTIME     ??? furosemide (LASIX) 20 MG tablet Take 20 mg by mouth daily.     ??? gentamicin, PF, (GARAMYCIN) 10 mg/mL Soln Take 2 vials to dermatology appointment three times a week as directed 20 mL 10   ??? HUMIRA PEN CITRATE FREE 40 MG/0.4 ML Inject the contents of 1 pen (40mg ) under the skin once weekly. 4 each 11   ??? HYDROcodone-acetaminophen (NORCO) 5-325 mg per tablet Take 1-2 tablets every 6 hours as needed for pain 15 tablet 0   ??? losartan (COZAAR) 50 MG tablet      ??? metroNIDAZOLE (FLAGYL) 250 MG tablet Take 1 tablet (250 mg total) by mouth Three (3) times a day. 270 tablet 2   ??? pantoprazole (PROTONIX) 40 MG tablet Take 40 mg by mouth daily.     ??? rivaroxaban (XARELTO) 20 mg tablet Take 20 mg by mouth daily with evening meal.     ??? triamcinolone (KENALOG) 0.1 % ointment Apply topically Two (2) times a day. To affected area(s) prn for rash 80 g 1     No current facility-administered medications for this visit.        Changes to medications: Dustin Prince reports no changes at this time.    Allergies   Allergen Reactions   ??? Sulfasalazine      Other reaction(s): Other (See Comments)  Low blood pressure   ??? Vancomycin Itching     Give with benadryl       Changes to allergies: No    SPECIALTY MEDICATION ADHERENCE     Humira 40/0.4 mg/ml: 1 dose left      Medication Adherence    Patient reported X missed doses in the last  month: 0  Specialty Medication: Humira  Patient is on additional specialty medications: No  Support network for adherence: family member          Specialty medication(s) dose(s) confirmed: Regimen is correct and unchanged.     Are there any concerns with adherence? No    Adherence counseling provided? Not needed    CLINICAL MANAGEMENT AND INTERVENTION      Clinical Benefit Assessment:    Do you feel the medicine is effective or helping your condition? Yes    Clinical Benefit counseling provided? Not needed    Adverse Effects Assessment:    Are you experiencing any side effects? No    Are you experiencing difficulty administering your medicine? No    Quality of Life Assessment:    How many days over the past month did your HS  keep you from your normal activities? For example, brushing your teeth or getting up in the morning. 0    Have you discussed this with your provider? Not needed    Acute Infection Status:    Acute infections noted within Epic:  No active infections    Patient reported infection: None    Therapy Appropriateness:    Is therapy appropriate? Yes, therapy is appropriate and should be continued    DISEASE/MEDICATION-SPECIFIC INFORMATION      For patients on injectable medications: Patient currently has 1 doses left.  Next injection is scheduled for 4/12.    PATIENT SPECIFIC NEEDS     - Does the patient have any physical, cognitive, or cultural barriers? No    - Is the patient high risk? No    - Does the patient require a Care Management Plan? No     - Does the patient require physician intervention or other additional services (i.e. nutrition, smoking cessation, social work)? No      SHIPPING     Specialty Medication(s) to be Shipped:   Inflammatory Disorders: Humira    Other medication(s) to be shipped: No additional medications requested for fill at this time     Changes to insurance: No    Delivery Scheduled: Yes, Expected medication delivery date: Wed, 4/13.     Medication will be delivered via UPS to the confirmed prescription address [facility].    The patient will receive a drug information handout for each medication shipped and additional FDA Medication Guides as required.  Verified that patient has previously received a Conservation officer, historic buildings.    All of the patient's questions and concerns have been addressed.    Lanney Gins   [facility] Shared [facility] Specialty Pharmacist

## 2020-07-07 DIAGNOSIS — L732 Hidradenitis suppurativa: Principal | ICD-10-CM

## 2020-07-07 NOTE — Unmapped (Signed)
Clinical Assessment Needed For: Dose Change  Medication: Humira 80mg /0.47ml  Last Fill Date/Day Supply: 06/07/20 / 28 days  Prior Authorization Required  Was previous dose already scheduled to fill: Yes    Notes to Pharmacist:

## 2020-07-18 ENCOUNTER — Encounter: Payer: Medicaid Other | Admitting: Family

## 2020-08-03 NOTE — Unmapped (Signed)
[facility] Refill Coordination Note    Specialty Medication(s) to be Shipped:   Inflammatory Disorders: Humira    Other medication(s) to be shipped: No additional medications requested for fill at this time     Dustin Prince, DOB: [birth year]  Phone: [phone] (home)       All above HIPAA information was verified with patient.     Was a Nurse, learning disability used for this call? No    Completed refill call assessment today to schedule patient's medication shipment from the [facility] [phone]).  All relevant notes have been reviewed.     Specialty medication(s) and dose(s) confirmed: Regimen is correct and unchanged.   Changes to medications: Elizjah reports no changes at this time.  Changes to insurance: No  New side effects reported not previously addressed with a pharmacist or physician: None reported  Questions for the pharmacist: No    Confirmed patient received a Conservation officer, historic buildings and a Surveyor, mining with first shipment. The patient will receive a drug information handout for each medication shipped and additional FDA Medication Guides as required.       DISEASE/MEDICATION-SPECIFIC INFORMATION        For patients on injectable medications: Patient currently has 1 doses left.  Next injection is scheduled for 08/08/2020.    SPECIALTY MEDICATION ADHERENCE     Medication Adherence    Patient reported X missed doses in the last month: 0  Specialty Medication: Humira CF 80 mg/0.8 ml   Patient is on additional specialty medications: No  Any gaps in refill history greater than 2 weeks in the last 3 months: no  Demonstrates understanding of importance of adherence: yes  Informant: patient  Reliability of informant: reliable  Support network for adherence: family member  Confirmed plan for next specialty medication refill: delivery by pharmacy  Refills needed for supportive medications: not needed              Were doses missed due to medication being on hold? No    Humira CF 80 mg/0.8 mg/ml: 7 days of medicine on hand       REFERRAL TO PHARMACIST     Referral to the pharmacist: Not needed      [facility]     Shipping address confirmed in Epic.     Delivery Scheduled: Yes, Expected medication delivery date: 08/09/2020.     Medication will be delivered via UPS to the prescription address in Epic WAM.    Viyan Rosamond D Lonya Johannesen   [facility] Shared [facility] Specialty Technician

## 2020-08-04 ENCOUNTER — Other Ambulatory Visit (HOSPITAL_COMMUNITY): Payer: Self-pay

## 2020-08-04 MED FILL — Cabotegravir 600 MG/3ML & Rilpivirine 900 MG/3ML IM Susp ER: INTRAMUSCULAR | 34 days supply | Qty: 6 | Fill #0 | Status: AC

## 2020-08-05 ENCOUNTER — Other Ambulatory Visit: Payer: Self-pay | Admitting: Student in an Organized Health Care Education/Training Program

## 2020-08-05 DIAGNOSIS — I825Z2 Chronic embolism and thrombosis of unspecified deep veins of left distal lower extremity: Secondary | ICD-10-CM

## 2020-08-05 DIAGNOSIS — I82409 Acute embolism and thrombosis of unspecified deep veins of unspecified lower extremity: Secondary | ICD-10-CM

## 2020-08-07 ENCOUNTER — Telehealth: Payer: Self-pay

## 2020-08-07 NOTE — Telephone Encounter (Signed)
[facility] Patient Advocate Encounter  Patient's medication [name] have been couriered to [facility] from [employer] and will be administered on patient next office visit on 08/17/20.  [name] , [place] [facility] Patient [facility] for Infectious Disease Phone: [phone] Fax:  [phone]

## 2020-08-08 MED FILL — HUMIRA(CF) PEN 80 MG/0.8 ML SUBCUTANEOUS KIT: 28 days supply | Qty: 4 | Fill #1

## 2020-08-10 ENCOUNTER — Other Ambulatory Visit: Payer: Self-pay | Admitting: Internal Medicine

## 2020-08-10 NOTE — Telephone Encounter (Signed)
Next appt scheduled 09/07/20 with Dr Eileen Stanford.

## 2020-08-14 ENCOUNTER — Other Ambulatory Visit: Payer: Medicaid Other

## 2020-08-17 ENCOUNTER — Other Ambulatory Visit: Payer: Self-pay

## 2020-08-17 ENCOUNTER — Encounter: Payer: Self-pay | Admitting: Family

## 2020-08-17 ENCOUNTER — Ambulatory Visit (INDEPENDENT_AMBULATORY_CARE_PROVIDER_SITE_OTHER): Payer: Medicaid Other | Admitting: Family

## 2020-08-17 VITALS — BP 129/79 | HR 59 | Temp 98.2°F | Wt 336.0 lb

## 2020-08-17 DIAGNOSIS — B20 Human immunodeficiency virus [HIV] disease: Secondary | ICD-10-CM | POA: Diagnosis not present

## 2020-08-17 MED ORDER — CABOTEGRAVIR & RILPIVIRINE ER 600 & 900 MG/3ML IM SUER
1.0000 | Freq: Once | INTRAMUSCULAR | Status: AC
  Administered 2020-08-17: 1 via INTRAMUSCULAR

## 2020-08-17 NOTE — Progress Notes (Signed)
Patient ID: Allen Espinoza, male    DOB: [birth year], 60 y.o.   MRN: [id number]  Subjective:    Chief Complaint  Patient presents with  . Follow-up    Cabenuva injection, 2 month follow up , no concerns      HPI:  Allen Espinoza is a 60 y.o. male with HIV disease last seen on 3/18 with well controlled virus and good tolerance to his [place]. Received 600/900 mg dose to transition to q 2 month dosing. Here today for follow up.  Allen Espinoza has been doing well since his last injection and had no problems post-injection. Feeling well today with no new concerns/complaints. Has several questions about HIV and being undetectable. Denies fevers, chills, night sweats, headaches, changes in vision, neck pain/stiffness, nausea, diarrhea, vomiting, lesions or rashes.    Allergies  Allergen Reactions  . Sulfa Antibiotics Other (See Comments)    Low blood pressure  . Vancomycin Itching    Give with benadryl      Outpatient Medications Prior to Visit  Medication Sig Dispense Refill  . albuterol (PROVENTIL HFA;VENTOLIN HFA) 108 (90 Base) MCG/ACT inhaler Inhale 2 puffs into the lungs daily as needed for wheezing or shortness of breath.    Tyler Aas & rilpivirine ER (CABENUVA) 600 & 900 MG/3ML injection INJECT 1 KIT INTO THE MUSCLE EVERY 8 (EIGHT) WEEKS. 6 mL 6  . Cholecalciferol (VITAMIN D) 50 MCG (2000 UT) CAPS Take 1 capsule by mouth.    . furosemide (LASIX) 20 MG tablet Take 1 tablet by mouth once daily 90 tablet 0  . HUMIRA PEN 80 MG/0.8ML PNKT Inject into the skin.    Marland Kitchen losartan (COZAAR) 50 MG tablet Take 50 mg by mouth daily.    . metroNIDAZOLE (FLAGYL) 250 MG tablet Take 250 mg by mouth daily.    Marland Kitchen triamcinolone ointment (KENALOG) 0.1 % Apply topically.    . vitamin B-12 (CYANOCOBALAMIN) 100 MCG tablet Take 100 mcg by mouth daily.    Alveda Reasons 20 MG TABS tablet TAKE 1 TABLET BY MOUTH ONCE DAILY WITH SUPPER 90 tablet 0  . Adalimumab 40 MG/0.4ML PNKT INJECT THE CONTENTS OF 1 PEN  (40MG) UNDER THE SKIN ONCE EACH WEEK     No facility-administered medications prior to visit.     Past Medical History:  Diagnosis Date  . Abscess    Between legs  . Arthritis    all over  . Asthma   . Avascular necrosis of femoral head, left ([place])   . Blood transfusion without reported diagnosis   . Cholelithiasis   . CKD (chronic kidney disease) stage 3, GFR 30-59 ml/min (HCC)    sees kidney Dr.  . Clotting disorder [facility])    left DVT  . Diverticulosis   . DVT (deep venous thrombosis) (HCC)    legs  . Dyspnea    when walking  . Dysrhythmia    remembers mother taking about having an irregular rhythm years when he was a child   . GERD (gastroesophageal reflux disease)   . HIV (human immunodeficiency virus infection) ([place])   . Hypertension   . Morbid obesity ([place])      Past Surgical History:  Procedure Laterality Date  . COLONOSCOPY  2019  . DENTAL SURGERY     had teeth pulled  . HYDRADENITIS EXCISION Left 10/14/2016   Procedure: WIDE EXCISION HIDRADENITIS LEFT AXILLA;  Surgeon: Coralie Keens, MD;  Location: [place];  Service: General;  Laterality: Left;  .  JOINT REPLACEMENT     Left hip Dr. Ninfa Linden 11/29/16  . TOTAL HIP ARTHROPLASTY Left 11/29/2016   Procedure: LEFT TOTAL HIP ARTHROPLASTY ANTERIOR APPROACH;  Surgeon: [name], MD;  Location: [facility];  Service: Orthopedics;  Laterality: Left;       Review of Systems  Constitutional: Negative for appetite change, chills, fatigue, fever and unexpected weight change.  Eyes: Negative for visual disturbance.  Respiratory: Negative for cough, chest tightness, shortness of breath and wheezing.   Cardiovascular: Negative for chest pain and leg swelling.  Gastrointestinal: Negative for abdominal pain, constipation, diarrhea, nausea and vomiting.  Genitourinary: Negative for dysuria, flank pain, frequency, genital sores, hematuria and urgency.  Skin: Negative for rash.  Allergic/Immunologic: Negative for  immunocompromised state.  Neurological: Negative for dizziness and headaches.      Objective:    BP 129/79   Pulse (!) 59   Temp 98.2 F (36.8 C) (Oral)   Wt (!) 336 lb (152.4 kg)   SpO2 98%   BMI 45.57 kg/m  Nursing note and vital signs reviewed.  Physical Exam Constitutional:      General: He is not in acute distress.    Appearance: He is well-developed.  Eyes:     Conjunctiva/sclera: Conjunctivae normal.  Cardiovascular:     Rate and Rhythm: Normal rate and regular rhythm.     Heart sounds: Normal heart sounds. No murmur heard. No friction rub. No gallop.   Pulmonary:     Effort: Pulmonary effort is normal. No respiratory distress.     Breath sounds: Normal breath sounds. No wheezing or rales.  Chest:     Chest wall: No tenderness.  Abdominal:     General: Bowel sounds are normal.     Palpations: Abdomen is soft.     Tenderness: There is no abdominal tenderness.  Musculoskeletal:     Cervical back: Neck supple.  Lymphadenopathy:     Cervical: No cervical adenopathy.  Skin:    General: Skin is warm and dry.     Findings: No rash.  Neurological:     Mental Status: He is alert and oriented to person, place, and time.  Psychiatric:        Behavior: Behavior normal.        Thought Content: Thought content normal.        Judgment: Judgment normal.      Depression screen [facility] 2/9 08/17/2020 06/16/2020 04/20/2020 02/15/2020 01/06/2020  Decreased Interest 0 0 0 0 0  Down, Depressed, Hopeless 0 0 0 0 0  PHQ - 2 Score 0 0 0 0 0  Altered sleeping - - - - -  Tired, decreased energy - - - - -  Change in appetite - - - - -  Feeling bad or failure about yourself  - - - - -  Trouble concentrating - - - - -  Moving slowly or fidgety/restless - - - - -  Suicidal thoughts - - - - -  PHQ-9 Score - - - - -  Difficult doing work/chores - - - - -  Some recent data might be hidden       Assessment & Plan:    Patient Active Problem List   Diagnosis Date Noted  .  Subcutaneous nodules 03/01/2019  . Essential hypertension 10/19/2018  . Sleep disorder 03/05/2018  . 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 Morbid (severe) obesity due to excess calories ([place]) 08/19/2016  .  Solitary pulmonary nodule 05/06/2016  . Hidradenitis suppurativa 04/27/2016  . Chest pain 04/26/2016  . Avascular necrosis of bone of hip, left s/p TRH ([place]) 03/19/2016  . Human immunodeficiency virus (HIV) disease ([place]) 03/18/2016  . Chronic kidney disease 03/18/2016  . Cholelithiasis 03/18/2016  . Diverticulosis of colon without hemorrhage 03/18/2016     Problem List Items Addressed This Visit      Other   Human immunodeficiency virus (HIV) disease ([place]) - Primary (Chronic)    Allen Espinoza continues to have well controlled HIV disease with good tolerance of Cabenuva. Reviewed undetectable status and HIV testing. No signs/symptoms of opportunistic infection. Check viral load and CD4 count. Received 600/900 mg dose of Cabenuva without complication. Will continue 2 month dosing. Plan for follow up in 2 months or sooner if needed.       Relevant Orders   HIV-1 RNA quant-no reflex-bld   T-helper cell (CD4)- ([facility] clinic only)       I am having Allen Espinoza maintain his albuterol, Adalimumab, vitamin B-12, Vitamin D, losartan, metroNIDAZOLE, triamcinolone ointment, cabotegravir & rilpivirine ER, Xarelto, furosemide, and Humira Pen. We administered cabotegravir & rilpivirine ER.   Meds ordered this encounter  Medications  . cabotegravir & rilpivirine ER (CABENUVA) 600 & 900 MG/3ML injection 1 kit     Follow-up: Return in about 2 months (around 10/17/2020), or if symptoms worsen or fail to improve.   Terri Piedra, MSN, FNP-C Nurse Practitioner [facility] for Infectious Disease [place] number: [phone]

## 2020-08-17 NOTE — Assessment & Plan Note (Signed)
Mr. Peggs continues to have well controlled HIV disease with good tolerance of Cabenuva. Reviewed undetectable status and HIV testing. No signs/symptoms of opportunistic infection. Check viral load and CD4 count. Received 600/900 mg dose of Cabenuva without complication. Will continue 2 month dosing. Plan for follow up in 2 months or sooner if needed.

## 2020-08-17 NOTE — Patient Instructions (Addendum)
Nice to see you.  We will check your lab work today.   Let us know if you have any additional questions.  Plan for follow up and next injection in 2 months.   Have a great day and stay safe!

## 2020-08-18 LAB — T-HELPER CELL (CD4) - (RCID CLINIC ONLY)
CD4 % Helper T Cell: 19 % — ABNORMAL LOW (ref 33–65)
CD4 T Cell Abs: 440 /uL (ref 400–1790)

## 2020-08-21 LAB — HIV-1 RNA QUANT-NO REFLEX-BLD
HIV 1 RNA Quant: NOT DETECTED Copies/mL
HIV-1 RNA Quant, Log: NOT DETECTED Log cps/mL

## 2020-08-23 ENCOUNTER — Emergency Department (HOSPITAL_COMMUNITY): Payer: Medicaid Other

## 2020-08-23 ENCOUNTER — Encounter (HOSPITAL_COMMUNITY): Payer: Self-pay | Admitting: *Deleted

## 2020-08-23 ENCOUNTER — Emergency Department (HOSPITAL_COMMUNITY)
Admission: EM | Admit: 2020-08-23 | Discharge: 2020-08-23 | Disposition: A | Discharge status: Home / Self Care | Payer: Medicaid Other | Attending: Emergency Medicine | Admitting: Emergency Medicine

## 2020-08-23 DIAGNOSIS — Z7901 Long term (current) use of anticoagulants: Secondary | ICD-10-CM | POA: Insufficient documentation

## 2020-08-23 DIAGNOSIS — L0231 Cutaneous abscess of buttock: Secondary | ICD-10-CM | POA: Diagnosis not present

## 2020-08-23 DIAGNOSIS — S31811A Laceration without foreign body of right buttock, initial encounter: Secondary | ICD-10-CM | POA: Insufficient documentation

## 2020-08-23 DIAGNOSIS — J45909 Unspecified asthma, uncomplicated: Secondary | ICD-10-CM | POA: Diagnosis not present

## 2020-08-23 DIAGNOSIS — Z79899 Other long term (current) drug therapy: Secondary | ICD-10-CM | POA: Insufficient documentation

## 2020-08-23 DIAGNOSIS — X58XXXA Exposure to other specified factors, initial encounter: Secondary | ICD-10-CM | POA: Diagnosis not present

## 2020-08-23 DIAGNOSIS — R079 Chest pain, unspecified: Secondary | ICD-10-CM | POA: Insufficient documentation

## 2020-08-23 DIAGNOSIS — Z21 Asymptomatic human immunodeficiency virus [HIV] infection status: Secondary | ICD-10-CM | POA: Insufficient documentation

## 2020-08-23 DIAGNOSIS — N183 Chronic kidney disease, stage 3 unspecified: Secondary | ICD-10-CM | POA: Insufficient documentation

## 2020-08-23 DIAGNOSIS — S3992XA Unspecified injury of lower back, initial encounter: Secondary | ICD-10-CM | POA: Diagnosis present

## 2020-08-23 DIAGNOSIS — I129 Hypertensive chronic kidney disease with stage 1 through stage 4 chronic kidney disease, or unspecified chronic kidney disease: Secondary | ICD-10-CM | POA: Diagnosis not present

## 2020-08-23 DIAGNOSIS — Z96642 Presence of left artificial hip joint: Secondary | ICD-10-CM | POA: Diagnosis not present

## 2020-08-23 DIAGNOSIS — L0291 Cutaneous abscess, unspecified: Secondary | ICD-10-CM

## 2020-08-23 DIAGNOSIS — R0789 Other chest pain: Secondary | ICD-10-CM | POA: Diagnosis not present

## 2020-08-23 IMAGING — CR DG CHEST 2V
2 series · 2 of 2 positions shown · non-contrast
Comparison: Prior chest x-ray [DATE]

CLINICAL DATA: Intermittent central chest pain

EXAM:
CHEST - 2 VIEW

[w chest pa]
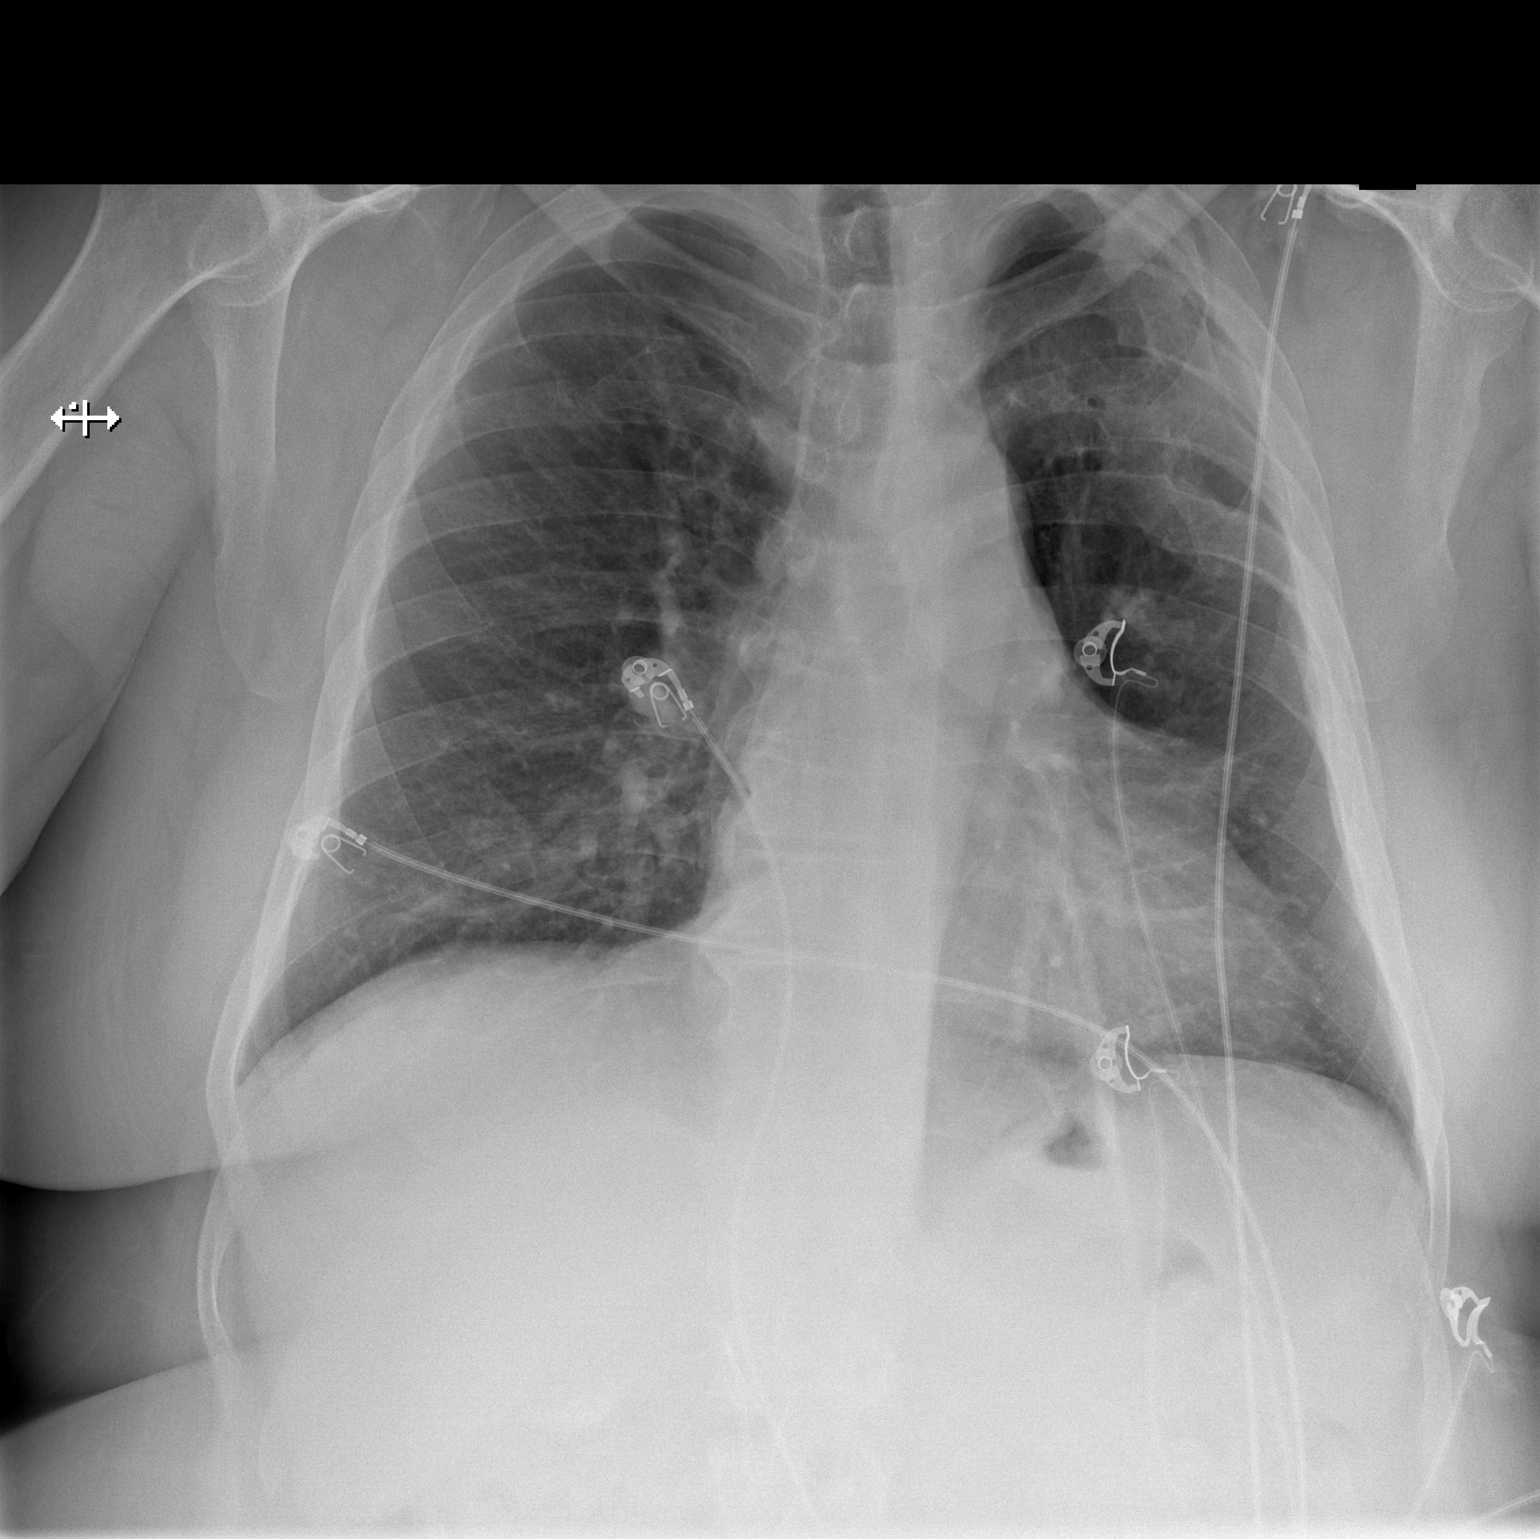

[w chest lat]
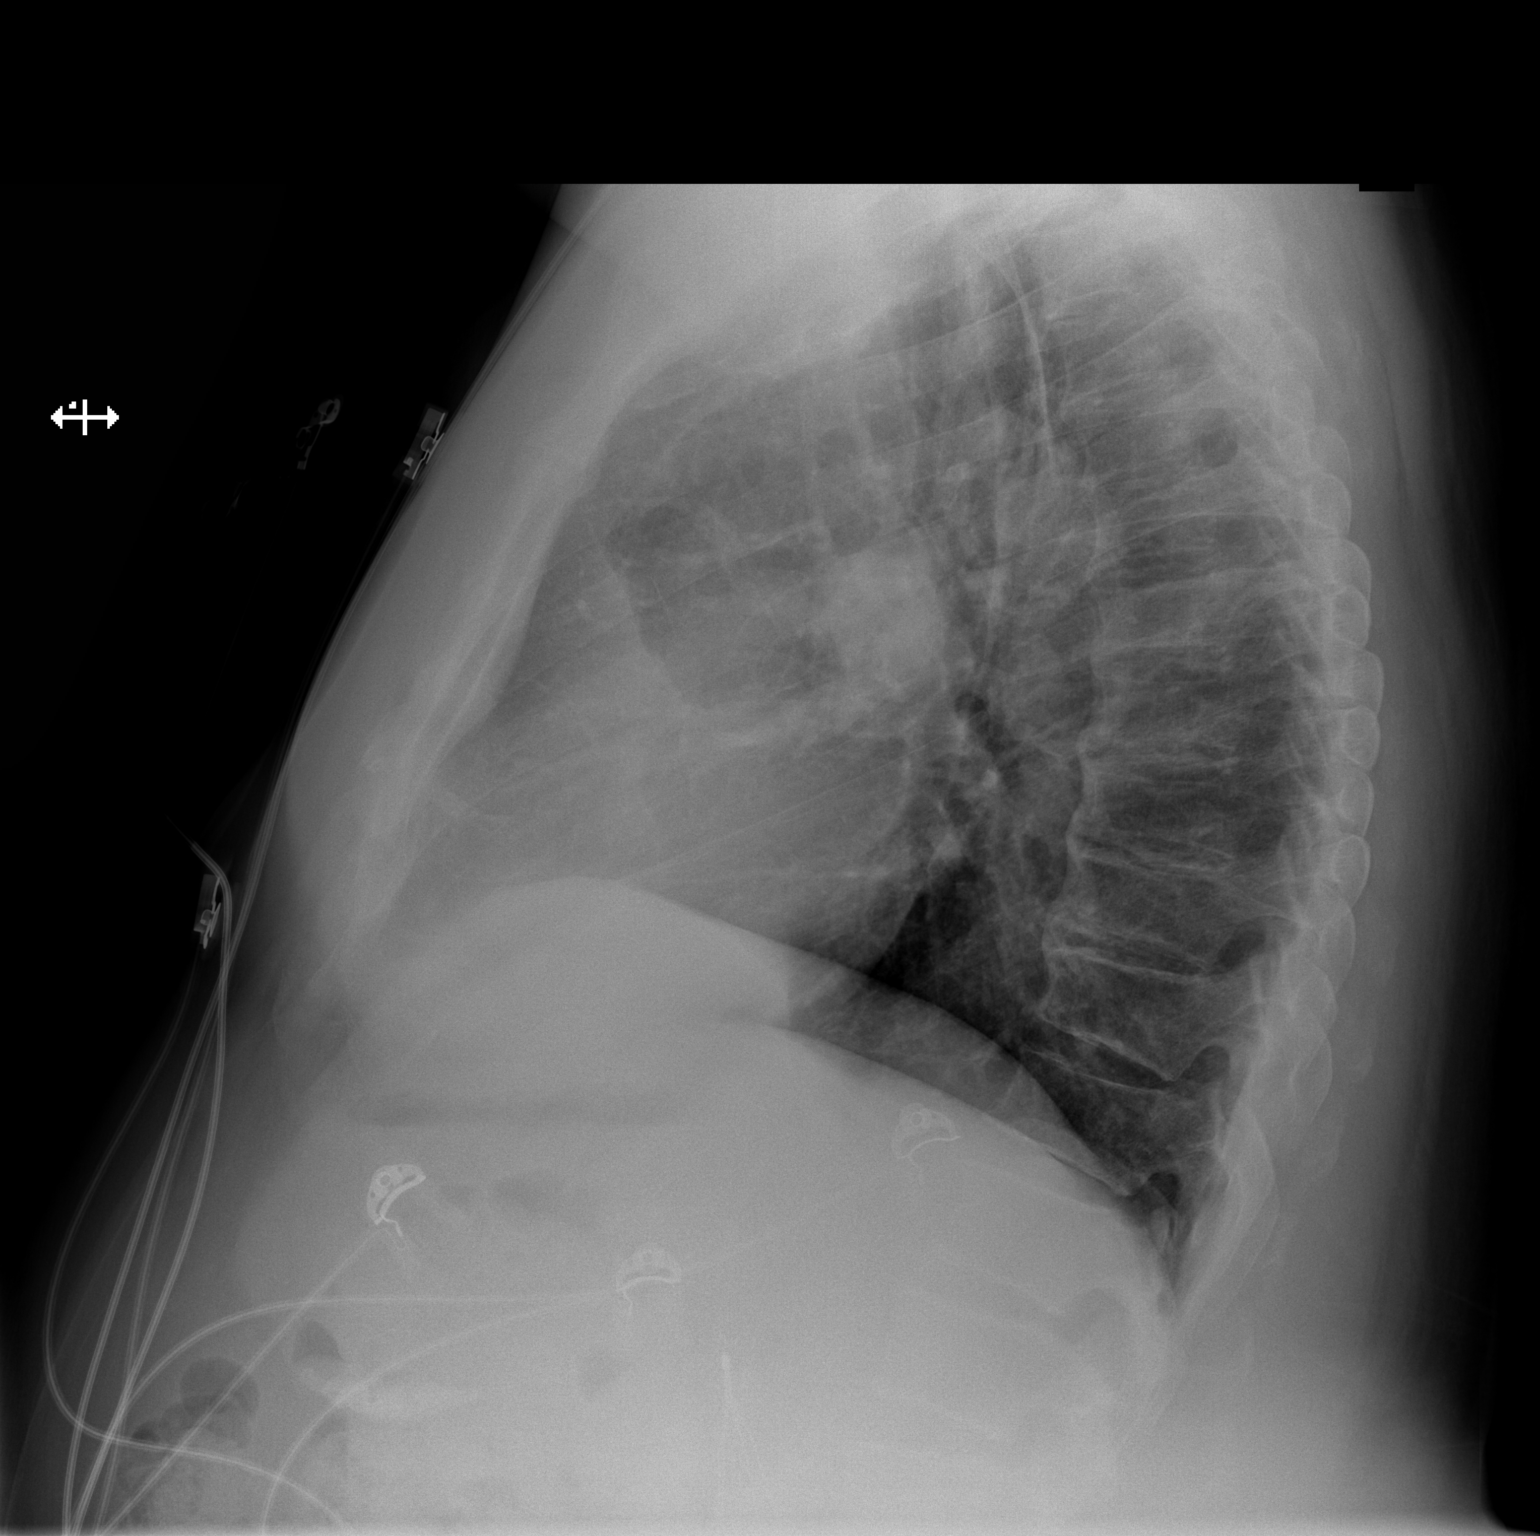

[2 of 2 positions shown; findings below may reference images not displayed]

FINDINGS: The heart size and mediastinal contours are within normal limits.
Both lungs are clear. The visualized skeletal structures are
unremarkable.

Remote healed left-sided rib fractures again noted.
IMPRESSION: No active cardiopulmonary disease.

## 2020-08-23 MED ORDER — DOXYCYCLINE HYCLATE 100 MG PO CAPS: 100.0000 mg | ORAL_CAPSULE | Freq: Two times a day (BID) | ORAL | 0 refills | Status: AC

## 2020-08-23 MED ORDER — ACETAMINOPHEN 500 MG PO TABS
1000.0000 mg | ORAL_TABLET | Freq: Once | ORAL | Status: AC
  Administered 2020-08-23: 1000 mg via ORAL
  Filled 2020-08-23: qty 2

## 2020-08-23 MED ORDER — LIDOCAINE-EPINEPHRINE (PF) 2 %-1:200000 IJ SOLN
20.0000 mL | Freq: Once | INTRAMUSCULAR | Status: AC
  Administered 2020-08-23: 20 mL
  Filled 2020-08-23: qty 20

## 2020-08-23 NOTE — ED Provider Notes (Addendum)
[facility] DEPT Provider Note   CSN: [id number] Arrival date & time: 08/23/20  0813     History Chief Complaint  Patient presents with  . Chest Pain  . Recurrent Skin Infections    Allen Espinoza is a 60 y.o. male.  HPI  Pt is here for two reasons today.  Pt states he has had intermittent chest pain for just over 1 month. He states it seems to occur intermittently approximately 5-10 seconds every few days. Can occur at rest or during movement/exertion but does not seem to get better/worse or more/less frequent with any behaviors. Is non-positional and non-pleuritic. Denies any associated sx. Specifically no fever,cough,congestion, NVD, LH/Dizziness, weakness or fatigue. No diaphoresis.  The pain is mild but sharp. Nonradiating.   He also has boils to is right buttucks and groin. These have been an ongoing issue as he has Hidradenitis suppurative and struggles with these. States he has had the most recent HS flare over the past 4-5 days. Denies any systemic symptoms (fevers,chills, NV) but states it's unfomcfortable to sit. States one in his groin is already draining.       Past Medical History:  Diagnosis Date  . Abscess    Between legs  . Arthritis    all over  . Asthma   . Avascular necrosis of femoral head, left ([place])   . Blood transfusion without reported diagnosis   . Cholelithiasis   . CKD (chronic kidney disease) stage 3, GFR 30-59 ml/min (HCC)    sees kidney Dr.  . Clotting disorder [facility])    left DVT  . Diverticulosis   . DVT (deep venous thrombosis) (HCC)    legs  . Dyspnea    when walking  . Dysrhythmia    remembers mother taking about having an irregular rhythm years when he was a child   . GERD (gastroesophageal reflux disease)   . HIV (human immunodeficiency virus infection) ([place])   . Hypertension   . Morbid obesity [facility])     Patient Active Problem List   Diagnosis Date Noted  . Subcutaneous nodules 03/01/2019  .  Essential hypertension 10/19/2018  . Sleep disorder 03/05/2018  . 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 Morbid (severe) obesity due to excess calories ([place]) 08/19/2016  . Solitary pulmonary nodule 05/06/2016  . Hidradenitis suppurativa 04/27/2016  . Chest pain 04/26/2016  . Avascular necrosis of bone of hip, left s/p TRH ([place]) 03/19/2016  . Human immunodeficiency virus (HIV) disease ([place]) 03/18/2016  . Chronic kidney disease 03/18/2016  . Cholelithiasis 03/18/2016  . Diverticulosis of colon without hemorrhage 03/18/2016    Past Surgical History:  Procedure Laterality Date  . COLONOSCOPY  2019  . DENTAL SURGERY     had teeth pulled  . HYDRADENITIS EXCISION Left 10/14/2016   Procedure: WIDE EXCISION HIDRADENITIS LEFT AXILLA;  Surgeon: Coralie Keens, MD;  Location: [facility];  Service: General;  Laterality: Left;  . JOINT REPLACEMENT     Left hip Dr. Ninfa Linden 11/29/16  . TOTAL HIP ARTHROPLASTY Left 11/29/2016   Procedure: LEFT TOTAL HIP ARTHROPLASTY ANTERIOR APPROACH;  Surgeon: [name], MD;  [facility];  Service: Orthopedics;  Laterality: Left;       Family History  Problem Relation Age of Onset  . Heart attack Mother 56  . Heart attack Father 4  . Prostate cancer Brother   . Bone cancer Maternal Aunt   . Breast cancer Maternal  Aunt   . Colon cancer Neg Hx   . Esophageal cancer Neg Hx   . Stomach cancer Neg Hx   . Rectal cancer Neg Hx     Social History   Tobacco Use  . Smoking status: Never Smoker  . Smokeless tobacco: Never Used  Vaping Use  . Vaping Use: Never used  Substance Use Topics  . Alcohol use: No    Comment: prior  . Drug use: No    Comment: prior cocaine use, last 2005    Home Medications Prior to Admission medications   Medication Sig Start Date End Date Taking? Authorizing Provider  cabotegravir & rilpivirine ER (CABENUVA) 600 & 900 MG/3ML injection INJECT 1 KIT INTO THE  MUSCLE EVERY 8 (EIGHT) WEEKS. Patient taking differently: Inject 1 kit into the muscle every 8 (eight) weeks. 05/19/20 05/19/21 Yes [name] Callas, NP  doxycycline (VIBRAMYCIN) 100 MG capsule Take 1 capsule (100 mg total) by mouth 2 (two) times daily for 7 days. 08/23/20 08/30/20 Yes Ellianah Cordy S, PA  furosemide (LASIX) 20 MG tablet Take 1 tablet by mouth once daily Patient taking differently: Take 20 mg by mouth daily. 08/10/20  Yes Katsadouros, Vasilios, MD  HUMIRA PEN 80 MG/0.8ML PNKT Inject 80 mg into the skin once a week. 08/08/20  Yes [provider]  losartan (COZAAR) 50 MG tablet Take 50 mg by mouth daily.   Yes [provider]  metroNIDAZOLE (FLAGYL) 250 MG tablet Take 250 mg by mouth 3 (three) times daily.   Yes [provider]  XARELTO 20 MG TABS tablet TAKE 1 TABLET BY MOUTH ONCE DAILY WITH SUPPER Patient taking differently: Take 20 mg by mouth daily with supper. 08/07/20  Yes Jose Persia, MD    Allergies    Sulfa antibiotics and Vancomycin  Review of Systems   Review of Systems  Constitutional: Negative for chills and fever.  HENT: Negative for congestion.   Respiratory: Negative for shortness of breath.   Cardiovascular: Positive for chest pain (None now).  Gastrointestinal: Negative for abdominal pain, diarrhea, nausea and vomiting.  Musculoskeletal: Negative for neck pain.  Skin:       Abscess    Physical Exam Updated Vital Signs BP 135/63   Pulse (!) 49   Temp 98.6 F (37 C) (Oral)   Resp (!) 21   Ht 6' (1.829 m)   Wt (!) 151.5 kg   SpO2 100%   BMI 45.30 kg/m   Physical Exam Vitals and nursing note reviewed.  Constitutional:      General: He is not in acute distress. HENT:     Head: Normocephalic and atraumatic.     Nose: Nose normal.  Eyes:     General: No scleral icterus. Cardiovascular:     Rate and Rhythm: Normal rate and regular rhythm.     Pulses: Normal pulses.     Heart sounds: Normal heart sounds.      Comments: No MRG Pulmonary:     Effort: Pulmonary effort is normal. No respiratory distress.     Breath sounds: Normal breath sounds. No wheezing.  Abdominal:     Palpations: Abdomen is soft.     Tenderness: There is no abdominal tenderness. There is no guarding or rebound.     Comments: Small area of fluctuance (<1cm) in right groin area that is freely draining.   Musculoskeletal:     Cervical back: Normal range of motion.     Right lower leg: No edema.  Left lower leg: No edema.     Comments: Right gluteus with fluctuant 5cm x 2cm with no surrounding erthyma or induration  2x2 cm abscess located over the right gluteus.  Skin:    General: Skin is warm and dry.     Capillary Refill: Capillary refill takes less than 2 seconds.  Neurological:     Mental Status: He is alert. Mental status is at baseline.  Psychiatric:        Mood and Affect: Mood normal.        Behavior: Behavior normal.     ED Results / Procedures / Treatments   Labs (all labs ordered are listed, but only abnormal results are displayed) Labs Reviewed - No data to display  EKG EKG Interpretation  Date/Time:  Wednesday Aug 23 2020 08:26:43 EDT Ventricular Rate:  51 PR Interval:  187 QRS Duration: 90 QT Interval:  404 QTC Calculation: 372 R Axis:   37 Text Interpretation: Sinus rhythm 12 Lead; [place] No significant change since last tracing Normal ECG Confirmed by Blanchie Dessert [phone]) on 08/23/2020 9:00:51 AM   Radiology DG Chest 2 View  Result Date: 08/23/2020 CLINICAL DATA:  Intermittent central chest pain EXAM: CHEST - 2 VIEW COMPARISON:  Prior chest x-ray 09/02/2017 FINDINGS: The heart size and mediastinal contours are within normal limits. Both lungs are clear. The visualized skeletal structures are unremarkable. Remote healed left-sided rib fractures again noted. IMPRESSION: No active cardiopulmonary disease. Electronically Signed   By: Jacqulynn Cadet M.D.   On: 08/23/2020 08:49     Procedures .Marland KitchenIncision and Drainage  Date/Time: 08/23/2020 10:54 AM Performed by: Tedd Sias, PA Authorized by: Tedd Sias, PA   Consent:    Consent obtained:  Verbal   Consent given by:  Patient   Risks discussed:  Bleeding, incomplete drainage, pain and damage to other organs   Alternatives discussed:  No treatment Universal protocol:    Procedure explained and questions answered to patient or proxy's satisfaction: yes     Relevant documents present and verified: yes     Test results available : yes     Imaging studies available: yes     Required blood products, implants, devices, and special equipment available: yes     Site/side marked: yes     Immediately prior to procedure, a time out was called: yes     Patient identity confirmed:  Verbally with patient Location:    Type:  Abscess   Size:  5cm x 2cm   Location:  Anogenital   Anogenital location: right gluteus. Pre-procedure details:    Skin preparation:  Betadine and chlorhexidine Anesthesia:    Anesthesia method:  Local infiltration   Local anesthetic:  Lidocaine 2% WITH epi Procedure type:    Complexity:  Complex Procedure details:    Incision types:  Single straight   Incision depth:  Subcutaneous   Wound management:  Probed and deloculated, irrigated with saline and extensive cleaning   Drainage:  Purulent and bloody   Drainage amount:  Copious Post-procedure details:    Procedure completion:  Tolerated well, no immediate complications .Marland KitchenIncision and Drainage  Date/Time: 08/23/2020 10:55 AM Performed by: Tedd Sias, PA Authorized by: Tedd Sias, PA   Consent:    Consent obtained:  Verbal   Consent given by:  Patient   Risks discussed:  Bleeding, incomplete drainage, pain and damage to other organs   Alternatives discussed:  No treatment Universal protocol:    Procedure explained and questions  answered to patient or proxy's satisfaction: yes     Relevant documents present and  verified: yes     Test results available : yes     Imaging studies available: yes     Required blood products, implants, devices, and special equipment available: yes     Site/side marked: yes     Immediately prior to procedure, a time out was called: yes     Patient identity confirmed:  Verbally with patient Location:    Type:  Abscess   Size:  2x2 cm    Location: right gluteus  Pre-procedure details:    Skin preparation:  Betadine Anesthesia:    Anesthesia method:  Local infiltration   Local anesthetic:  Lidocaine 2% WITH epi Procedure type:    Complexity:  Complex Procedure details:    Incision types:  Single straight   Incision depth:  Subcutaneous   Wound management:  Probed and deloculated, irrigated with saline and extensive cleaning   Drainage:  Purulent   Drainage amount:  Moderate Post-procedure details:    Procedure completion:  Tolerated well, no immediate complications .Marland KitchenLaceration Repair  Date/Time: 08/23/2020 10:56 AM Performed by: Tedd Sias, PA Authorized by: Tedd Sias, PA   Consent:    Consent obtained:  Verbal   Consent given by:  Patient   Risks discussed:  Infection, need for additional repair, pain, poor cosmetic result and poor wound healing   Alternatives discussed:  No treatment and delayed treatment Universal protocol:    Procedure explained and questions answered to patient or proxy's satisfaction: yes     Relevant documents present and verified: yes     Test results available: yes     Imaging studies available: yes     Required blood products, implants, devices, and special equipment available: yes     Site/side marked: yes     Immediately prior to procedure, a time out was called: yes     Patient identity confirmed:  Verbally with patient Anesthesia:    Anesthesia method:  Local infiltration   Local anesthetic:  Lidocaine 2% WITH epi Laceration details:    Location: right gluteus - incised.   Length (cm):  4 Exploration:     Hemostasis achieved with:  Direct pressure   Wound extent: no foreign bodies/material noted and no tendon damage noted     Contaminated: no   Treatment:    Area cleansed with:  Saline   Amount of cleaning:  Standard   Irrigation solution:  Sterile saline   Irrigation volume:  100   Irrigation method:  Pressure wash   Visualized foreign bodies/material removed: no   Skin repair:    Repair method:  Sutures   Suture size:  3-0   Suture material:  Plain gut   Suture technique:  Simple interrupted   Number of sutures:  1 Approximation:    Approximation:  Loose Repair type:    Repair type:  Simple Post-procedure details:    Procedure completion:  Tolerated well, no immediate complications Comments:     Simple interrupted single stitch placed in right gluteal incision and drainage space this is a large incision was made and therefore a single stitch was used to hold this area closed.  It is gaping on either side of stitch with good room for drainage.  This was loosely approximated.      Medications Ordered in ED Medications  lidocaine-EPINEPHrine (XYLOCAINE W/EPI) 2 %-1:200000 (PF) injection 20 mL (20 mLs Infiltration Given 08/23/20 0918)  acetaminophen (TYLENOL) tablet 1,000 mg (1,000 mg Oral Given 08/23/20 0488)    ED Course  I have reviewed the triage vital signs and the nursing notes.  Pertinent labs & imaging results that were available during my care of the patient were reviewed by me and considered in my medical decision making (see chart for details).    MDM Rules/Calculators/A&P                          Patient with multiple abscesses.  He is a 60 year old male presented today with several abscesses to the right gluteal region.  He is also here for chest pain although he has not had any chest pain today or yesterday he states is been an intermittent issue for nearly a year.  He denies any shortness of breath nausea or vomiting.  I incised and drained 2 of the gluteal  abscesses.  After incision there was copious purulence and bloody liquid removed from the abscess area.  Fluctuance is resolved.  Probed and deloculated and irrigated.  Given that one of the abscesses was large and a slightly over 3 cm incision was made and 1x suture was placed to keep the wound from keeping.  It is keeping on either side of the stitch however the loose approximation will help wound healing.  Patient given doxycycline to take.  Patient agreeable to plan.  Bandage placed on patient.  Vital signs within normal limits at discharge.  Antibiotic sent to pharmacy.  Patient has no questions prior to discharge.   As per patient's chest pain as he is having no active chest pain has EKG without any ST-T wave abnormalities, normal sinus rhythm with no significant axis deviations and very atypical and consistent episodes of chest pain will discharge home without any additional work-up.  He is agreeable to this plan and will follow up with PCP regarding further symptoms.  Return precautions given.  For any new or active chest pain he will return to ER.  Final Clinical Impression(s) / ED Diagnoses Final diagnoses:  Abscess  Gluteal abscess  Chest pain, unspecified type    Rx / DC Orders ED Discharge Orders         Ordered    doxycycline (VIBRAMYCIN) 100 MG capsule  2 times daily        08/23/20 1048           Pati Gallo [place], [place] 08/23/20 1100    Blanchie Dessert, MD 08/23/20 1518   Addendum for chart completeness.    Pati Gallo [place], [place] 09/01/20 1956    Blanchie Dessert, MD 09/04/20 1156

## 2020-08-23 NOTE — Discharge Instructions (Signed)
Please do warm compresses or warm sitz bath 2-3 times daily for the next week.  Take antibiotics for the entire course as prescribed.  Please follow-up with your dermatologist.  You may return to the ER for any new or concerning symptoms.  Otherwise monitor your symptoms and continue to do warm compresses.

## 2020-08-23 NOTE — ED Triage Notes (Signed)
Pt complains of chest pain, intermittent for the past month. Pain occurs at rest, also gets short of breath. Also has boils on butt and groin, history of HS.

## 2020-08-24 ENCOUNTER — Telehealth: Payer: Self-pay | Admitting: *Deleted

## 2020-08-24 ENCOUNTER — Encounter: Payer: Self-pay | Admitting: *Deleted

## 2020-08-24 NOTE — Telephone Encounter (Signed)
Transition Care Management Unsuccessful Follow-up Telephone Call  Date of discharge and from where:  08/23/2020 - [facility]  Attempts:  1st Attempt  Reason for unsuccessful TCM follow-up call:  No answer/busy

## 2020-08-25 NOTE — Telephone Encounter (Signed)
Transition Care Management Follow-up Telephone Call  Date of discharge and from where: 08/22/2020 from [place]  How have you been since you were released from the hospital? Pt states that he is feeling better.   Any questions or concerns? No  Items Reviewed:  Did the pt receive and understand the discharge instructions provided? Yes   Medications obtained and verified? Yes   Other? No   Any new allergies since your discharge? No   Dietary orders reviewed? n/a  Do you have support at home? Yes   Functional Questionnaire: (I = Independent and D = Dependent) ADLs: I  Bathing/Dressing- I  Meal Prep- I  Eating- I  Maintaining continence- I  Transferring/Ambulation- I  Managing Meds- I   Follow up appointments reviewed:   PCP Hospital f/u appt confirmed? Yes  Scheduled to see Nathanial Rancher, MD on 09/05/2020 @ 09:45am.  [facility] f/u appt confirmed? No    Are transportation arrangements needed? No   If their condition worsens, is the pt aware to call PCP or go to the Emergency Dept.? Yes  Was the patient provided with contact information for the PCP's office or ED? Yes  Was to pt encouraged to call back with questions or concerns? Yes

## 2020-08-29 ENCOUNTER — Encounter: Payer: Medicaid Other | Admitting: Infectious Diseases

## 2020-08-30 ENCOUNTER — Other Ambulatory Visit (HOSPITAL_COMMUNITY): Payer: Self-pay

## 2020-08-31 DIAGNOSIS — N183 Chronic kidney disease, stage 3 unspecified: Secondary | ICD-10-CM | POA: Diagnosis not present

## 2020-09-01 NOTE — Unmapped (Signed)
[facility] Refill Coordination Note    Specialty Medication(s) to be Shipped:   Inflammatory Disorders: Humira    Other medication(s) to be shipped: No additional medications requested for fill at this time     Dustin Prince, DOB: 04-29-1960  Phone: [phone] (home)       All above HIPAA information was verified with patient.     Was a Nurse, learning disability used for this call? No    Completed refill call assessment today to schedule patient's medication shipment from the [facility] [phone]).  All relevant notes have been reviewed.     Specialty medication(s) and dose(s) confirmed: Regimen is correct and unchanged.   Changes to medications: Hersey reports no changes at this time.  Changes to insurance: No  New side effects reported not previously addressed with a pharmacist or physician: None reported  Questions for the pharmacist: No    Confirmed patient received a Conservation officer, historic buildings and a Surveyor, mining with first shipment. The patient will receive a drug information handout for each medication shipped and additional FDA Medication Guides as required.       DISEASE/MEDICATION-SPECIFIC INFORMATION        For patients on injectable medications: Patient currently has 1 doses left.  Next injection is scheduled for 09/05/2020.    SPECIALTY MEDICATION ADHERENCE     Medication Adherence    Patient reported X missed doses in the last month: 0  Specialty Medication: Humira CF 80 mg/0.8 ml  Patient is on additional specialty medications: No  Any gaps in refill history greater than 2 weeks in the last 3 months: no  Demonstrates understanding of importance of adherence: yes  Informant: patient  Reliability of informant: reliable  Support network for adherence: family member  Confirmed plan for next specialty medication refill: delivery by pharmacy  Refills needed for supportive medications: not needed              Were doses missed due to medication being on hold? No    Humira CF 80 mg/0.8 mg/ml: 7 days of medicine on hand       REFERRAL TO PHARMACIST     Referral to the pharmacist: Not needed      [facility]     Shipping address confirmed in Epic.     Delivery Scheduled: Yes, Expected medication delivery date: 09/05/2020.     Medication will be delivered via UPS to the prescription address in Epic WAM.    Savayah Waltrip D Cristobal Advani   [facility] Specialty Technician

## 2020-09-04 MED FILL — HUMIRA(CF) PEN 80 MG/0.8 ML SUBCUTANEOUS KIT: 28 days supply | Qty: 4 | Fill #2

## 2020-09-05 ENCOUNTER — Encounter: Payer: Medicaid Other | Admitting: Internal Medicine

## 2020-09-07 ENCOUNTER — Encounter: Payer: Medicaid Other | Admitting: Internal Medicine

## 2020-09-07 DIAGNOSIS — I82409 Acute embolism and thrombosis of unspecified deep veins of unspecified lower extremity: Secondary | ICD-10-CM | POA: Diagnosis not present

## 2020-09-07 DIAGNOSIS — I129 Hypertensive chronic kidney disease with stage 1 through stage 4 chronic kidney disease, or unspecified chronic kidney disease: Secondary | ICD-10-CM | POA: Diagnosis not present

## 2020-09-07 DIAGNOSIS — I5189 Other ill-defined heart diseases: Secondary | ICD-10-CM | POA: Diagnosis not present

## 2020-09-07 DIAGNOSIS — L732 Hidradenitis suppurativa: Secondary | ICD-10-CM | POA: Diagnosis not present

## 2020-09-07 DIAGNOSIS — R0789 Other chest pain: Secondary | ICD-10-CM | POA: Diagnosis not present

## 2020-09-07 DIAGNOSIS — M87052 Idiopathic aseptic necrosis of left femur: Secondary | ICD-10-CM | POA: Diagnosis not present

## 2020-09-07 DIAGNOSIS — B20 Human immunodeficiency virus [HIV] disease: Secondary | ICD-10-CM | POA: Diagnosis not present

## 2020-09-07 DIAGNOSIS — N183 Chronic kidney disease, stage 3 unspecified: Secondary | ICD-10-CM | POA: Diagnosis not present

## 2020-09-18 ENCOUNTER — Encounter: Payer: Medicaid Other | Admitting: Family

## 2020-09-28 ENCOUNTER — Ambulatory Visit (HOSPITAL_COMMUNITY)
Admission: RE | Admit: 2020-09-28 | Discharge: 2020-09-28 | Disposition: A | Discharge status: Home / Self Care | Payer: Medicaid Other | Source: Ambulatory Visit | Attending: Internal Medicine | Admitting: Internal Medicine

## 2020-09-28 ENCOUNTER — Other Ambulatory Visit: Payer: Self-pay

## 2020-09-28 ENCOUNTER — Ambulatory Visit: Payer: Medicaid Other | Admitting: Student

## 2020-09-28 VITALS — BP 132/79 | HR 58 | Temp 98.1°F | Wt 336.4 lb

## 2020-09-28 DIAGNOSIS — I10 Essential (primary) hypertension: Secondary | ICD-10-CM | POA: Insufficient documentation

## 2020-09-28 DIAGNOSIS — N183 Chronic kidney disease, stage 3 unspecified: Secondary | ICD-10-CM

## 2020-09-28 DIAGNOSIS — R200 Anesthesia of skin: Secondary | ICD-10-CM | POA: Diagnosis not present

## 2020-09-28 DIAGNOSIS — R079 Chest pain, unspecified: Secondary | ICD-10-CM | POA: Diagnosis not present

## 2020-09-28 DIAGNOSIS — G479 Sleep disorder, unspecified: Secondary | ICD-10-CM

## 2020-09-28 LAB — D-DIMER, QUANTITATIVE: D-Dimer, Quant: 0.29 ug/mL-FEU (ref 0.00–0.50)

## 2020-09-28 IMAGING — DX DG CHEST 2V
2 series · 2 of 2 positions shown · non-contrast
Comparison: [DATE]

CLINICAL DATA: Left-sided atypical chest pain x6 months with
shortness of breath.

EXAM:
CHEST - 2 VIEW

[chest pa]
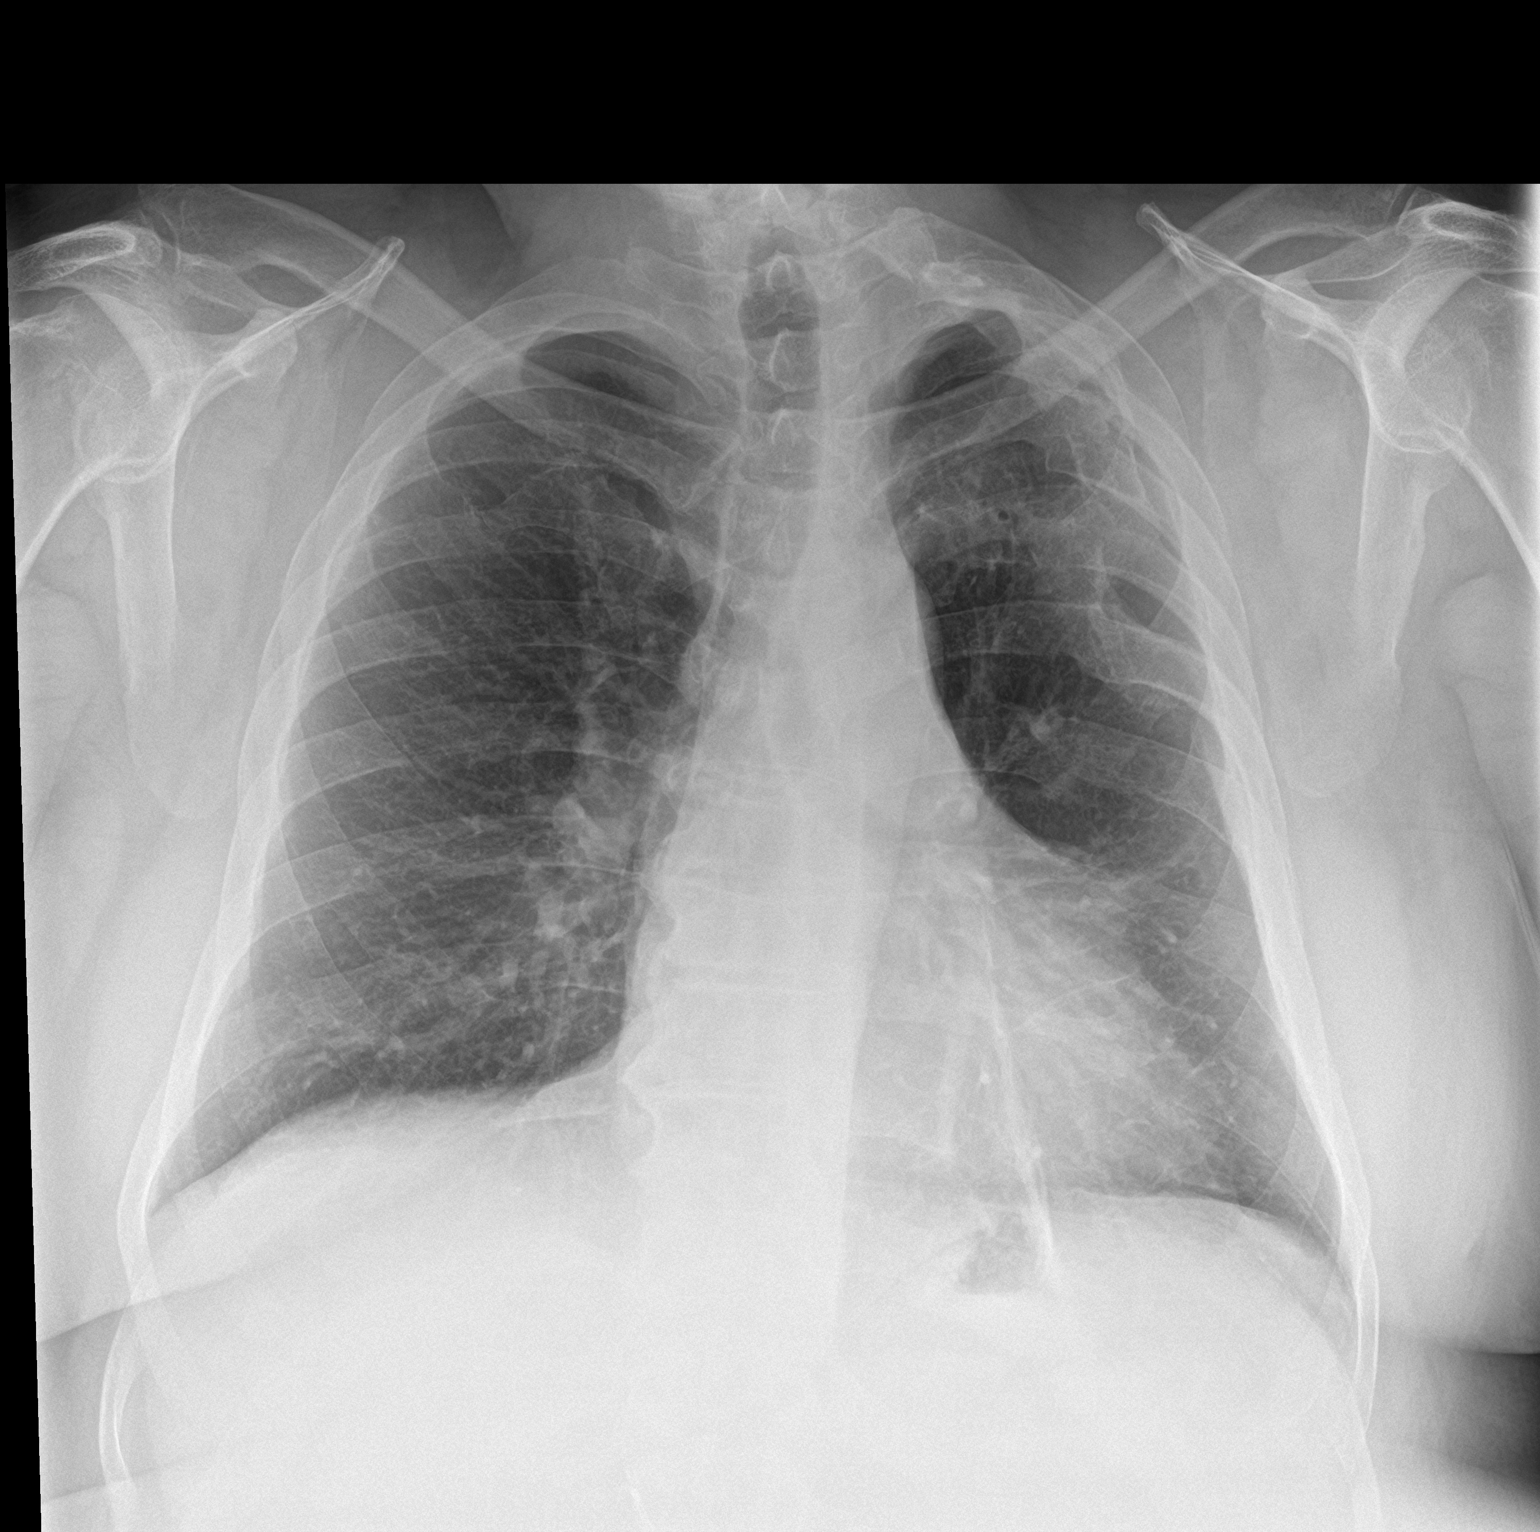

[chest lat]
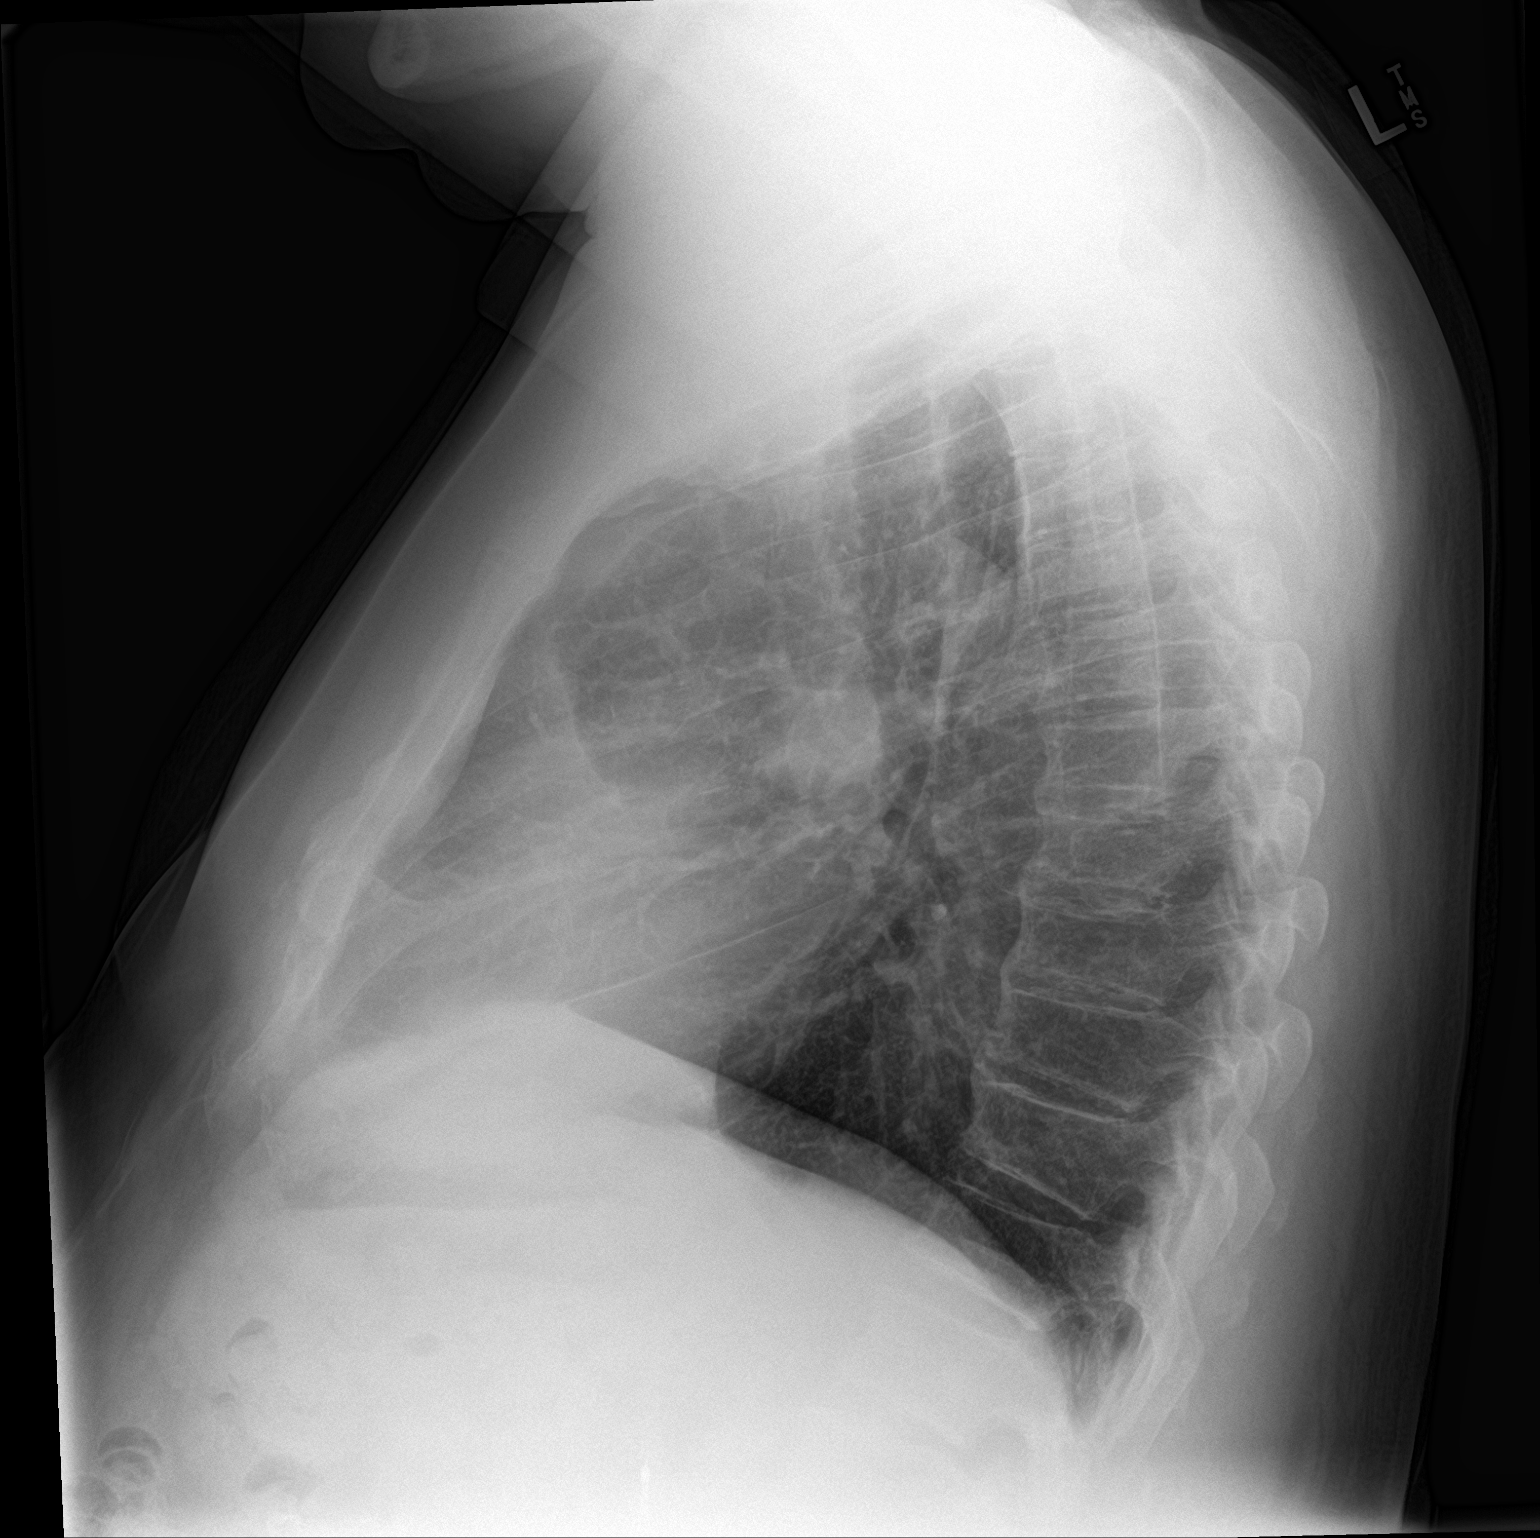

[2 of 2 positions shown; findings below may reference images not displayed]

FINDINGS: The heart size and mediastinal contours are within normal limits.
Band like atelectasis in the left lower lobe unchanged dating back
to [Y4]. No acute focal consolidation. No pleural effusion. No
pneumothorax. Remote healed left-sided rib fractures.
IMPRESSION: No active cardiopulmonary disease.

## 2020-09-28 MED ORDER — CYCLOBENZAPRINE HCL 5 MG PO TABS: 5.0000 mg | ORAL_TABLET | Freq: Three times a day (TID) | ORAL | 0 refills | Status: AC | PRN

## 2020-09-28 NOTE — Assessment & Plan Note (Signed)
BP Readings from Last 3 Encounters:  09/28/20 132/79  08/23/20 135/63  08/17/20 129/79   Blood pressure at goal. Denies dyspnea, lightheadedness, syncope, lower extremity edema. Will continue with current regimen.  - Furosemide 40mg  daily - Losartan 50mg  daily - BMET today
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
## 2020-09-28 NOTE — Progress Notes (Signed)
Internal Medicine Clinic Attending ? ?Case discussed with Dr. Braswell  At the time of the visit.  We reviewed the resident?s history and exam and pertinent patient test results.  I agree with the assessment, diagnosis, and plan of care documented in the resident?s note.  ?

## 2020-09-28 NOTE — Progress Notes (Signed)
    CC: scalp numbness, vivid dreams, chest pain  HPI:  [name] is a 60 y.o. with medical history as below presenting to [facility] for myriad of chronic symptoms including scalp numbness, vivid dreams, and chest pain.  Please see problem-based list for further details, assessments, and plans.  Past Medical History:  Diagnosis Date   Abscess    Between legs   Arthritis    all over   Asthma    Avascular necrosis of femoral head, left (HCC)    Blood transfusion without reported diagnosis    Cholelithiasis    CKD (chronic kidney disease) stage 3, GFR 30-59 ml/min (HCC)    sees kidney Dr.   [facility] disorder [facility])    left DVT   Diverticulosis    DVT (deep venous thrombosis) (HCC)    legs   Dyspnea    when walking   Dysrhythmia    remembers mother taking about having an irregular rhythm years when he was a child    GERD (gastroesophageal reflux disease)    HIV (human immunodeficiency virus infection) ([place])    Hypertension    Morbid obesity ([place])    Review of Systems:  As per HPI  Physical Exam:  Vitals:   09/28/20 0918  BP: 132/79  Pulse: (!) 58  Temp: 98.1 F (36.7 C)  TempSrc: Oral  SpO2: 100%  Weight: (!) 336 lb 6.4 oz (152.6 kg)   General: Obese, resting comfortably in no acute distress CV: Bradycardic, regular rhythm. No murmurs, rubs, gallops appreciated. Warm extremities. Pulm: Normal work of breathing on room air. Clear to auscultation bilaterally. MSK: Normal bulk, tone. No pitting edema bilaterally. Skin: Warm, dry. No rashes or lesions appreciated. Neuro: Awake, alert, oriented x4. Cranial nerves in tact. Motor 5/5 throughout. Sensation in tact throughout. Finger to nose normal. Normal gait. Psych: Normal mood, affect, speech.  Assessment & Plan:   See Encounters Tab for problem based charting.  Patient discussed with Dr. Philipp Ovens

## 2020-09-28 NOTE — Assessment & Plan Note (Signed)
Allen Espinoza reports intermittent scalp numbness over the last 6 months. He further describes that the numbness is non-focal and is all over this head simultaneously and with relief of symptoms after 5-10 seconds. Mentions that this occurs 3-4 times weekly. Denies association with a certain time of day or activity, vision changes, headache, focal numbness/weakness/tingling in extremities, dizziness, lightheadedness, fevers, chills. No previous history of this issue.   Neurological exam benign with normal mental status, cranial nerves, motor, sensation, and coordination. Allen Espinoza presentation is unusual without any associated symptoms. Possibilities include HIV neuropathy vs side effect of Humira. However, this likely would be unusual given the localization of his symptoms. Most recent MRI brain in 2020 without any overt abnormalities. Will have Allen Espinoza follow-up with neurology.  - Follow-up with neurology

## 2020-09-28 NOTE — Assessment & Plan Note (Signed)
Last BMP checked in October of 2021. Baseline appears to be creatinine 1.4-1.5 with GFR ~50. In November of 2021 found to have normal protein/creatinine ratio. Will repeat today. Patient to continue on ARB. - BMET today - Continue losartan 50mg  daily - Follow-up with nephrologist

## 2020-09-28 NOTE — Patient Instructions (Signed)
Mr.Deaven D Esguerra, it was a pleasure seeing you today!  Today we discussed: - Head numbness and vivid dreams: I have referred you to neurology. They will be contacting you for an appointment.  - Chest pain: I have prescribed a muscle relaxer to help the pain. I also want to check a chest x-ray today.  I have ordered the following labs today:   Lab Orders  BMP8+Anion Gap  D-dimer, quantitative (not at [facility])    Tests ordered today:  - Chest x-ray  Referrals ordered today:    Referral Orders  Ambulatory referral to Neurology    I have ordered the following medication/changed the following medications:   Start the following medications: Meds ordered this encounter  Medications   cyclobenzaprine (FLEXERIL) 5 MG tablet    Sig: Take 1 tablet (5 mg total) by mouth 3 (three) times daily as needed for up to 14 days for muscle spasms.    Dispense:  42 tablet    Refill:  0     Follow-up: 3 months   Please make sure to arrive 15 minutes prior to your next appointment. If you arrive late, you may be asked to reschedule.   We look forward to seeing you next time. Please call our clinic at [phone] if you have any questions or concerns. The best time to call is Monday-Friday from 9am-4pm, but there is someone available 24/7. If after hours or the weekend, call the main hospital number and ask for the Internal Medicine Resident On-Call. If you need medication refills, please notify your pharmacy one week in advance and they will send [place] a request.  Thank you for letting us take part in your care. Wishing you the best!  Thank you, Sanjuan Dame, MD

## 2020-09-28 NOTE — Assessment & Plan Note (Signed)
Allen Espinoza mentions he has been having vivid dreams for "awhile." He mentions that these dreams have been worsening and he has gotten out of bed in his sleep on a few occassions. He further notes previous sleep study in 2020 did not show sleep apnea. Reports he did not previously have any medications for this issue.  Per chart review, Allen Espinoza was referred to neurology in 2020. At that time MRI brain and sleep study were performed, both overall unremarkable. It was noted that Allen Espinoza did try klonipin without improvement of symptoms, although a higher dosage could be tried. On review of his chart, no obvious inciting medication that could be causing this. Will refer again to neurology.  - Neurology referral

## 2020-09-29 LAB — BMP8+ANION GAP
Anion Gap: 19 mmol/L — ABNORMAL HIGH (ref 10.0–18.0)
BUN/Creatinine Ratio: 13 (ref 10–24)
BUN: 17 mg/dL (ref 8–27)
CO2: 21 mmol/L (ref 20–29)
Calcium: 9.6 mg/dL (ref 8.6–10.2)
Chloride: 101 mmol/L (ref 96–106)
Creatinine, Ser: 1.31 mg/dL — ABNORMAL HIGH (ref 0.76–1.27)
Glucose: 101 mg/dL — ABNORMAL HIGH (ref 65–99)
Potassium: 4.3 mmol/L (ref 3.5–5.2)
Sodium: 141 mmol/L (ref 134–144)
eGFR: 62 mL/min/{1.73_m2} (ref >59)

## 2020-09-29 NOTE — Unmapped (Signed)
[facility] Refill Coordination Note    Specialty Medication(s) to be Shipped:   Inflammatory Disorders: Humira    Other medication(s) to be shipped: No additional medications requested for fill at this time     [name], DOB: 07-20-1960  Phone: [phone] (home)       All above HIPAA information was verified with patient.     Was a Nurse, learning disability used for this call? No    Completed refill call assessment today to schedule patient's medication shipment from the [facility] [phone]).  All relevant notes have been reviewed.     Specialty medication(s) and dose(s) confirmed: Regimen is correct and unchanged.   Changes to medications: Lebert reports no changes at this time.  Changes to insurance: No  New side effects reported not previously addressed with a pharmacist or physician: None reported  Questions for the pharmacist: No    Confirmed patient received a Conservation officer, historic buildings and a Surveyor, mining with first shipment. The patient will receive a drug information handout for each medication shipped and additional FDA Medication Guides as required.       DISEASE/MEDICATION-SPECIFIC INFORMATION        For patients on injectable medications: Patient currently has 1 doses left.  Next injection is scheduled for 10/03/2020.    SPECIALTY MEDICATION ADHERENCE     Medication Adherence    Patient reported X missed doses in the last month: 0  Specialty Medication: Humira CF 80 mg/0.8 ml  Patient is on additional specialty medications: No  Any gaps in refill history greater than 2 weeks in the last 3 months: no  Demonstrates understanding of importance of adherence: yes  Informant: patient  Reliability of informant: reliable  Support network for adherence: family member  Confirmed plan for next specialty medication refill: delivery by pharmacy  Refills needed for supportive medications: not needed              Were doses missed due to medication being on hold? No    Humira CF 80 mg/0.8 mg/ml: 7 days of medicine on hand       REFERRAL TO PHARMACIST     Referral to the pharmacist: Not needed      [facility]     Shipping address confirmed in Epic.     Delivery Scheduled: Yes, Expected medication delivery date: 10/04/2020.     Medication will be delivered via UPS to the prescription address in Epic WAM.    Paityn Balsam D Navada Osterhout   [facility] Specialty Technician

## 2020-10-03 ENCOUNTER — Encounter: Payer: Self-pay | Admitting: *Deleted

## 2020-10-03 ENCOUNTER — Other Ambulatory Visit (HOSPITAL_COMMUNITY): Payer: Self-pay

## 2020-10-03 MED FILL — Cabotegravir 600 MG/3ML & Rilpivirine 900 MG/3ML IM Susp ER: INTRAMUSCULAR | 34 days supply | Qty: 6 | Fill #1 | Status: AC

## 2020-10-03 MED FILL — HUMIRA(CF) PEN 80 MG/0.8 ML SUBCUTANEOUS KIT: 28 days supply | Qty: 4 | Fill #3

## 2020-10-04 ENCOUNTER — Telehealth: Payer: Self-pay

## 2020-10-04 NOTE — Telephone Encounter (Signed)
[facility] Patient Advocate Encounter  Patient's medication (cabenuva)have been couriered to [facility] from Allen Espinoza and will be administered on patient next office visit on 10/13/20.  Allen Espinoza , [facility] Patient [facility] Phone: [phone] Fax:  [phone]

## 2020-10-08 NOTE — Unmapped (Deleted)
Dermatology Note     Assessment and Plan:      Severe Hurley 3 HS scrotal, inguinal and perirenal; improved on Humira, but flared with stopping metronidazole, in patient w/ hx of well controlled HIV  -We discussed the typical natural history, pathogenesis, treatment options, and expected course as well as the relapsing and sometimes recalcitrant nature of the disease. ??  - Discussed considering deroofing procedure for thighs and procedure for scrotal area with Reconstructive Urology, which patient deferred to both today***  - Reviewed role of biologic therapy including humira and remicade   - Based on discussion above and r/b/a reviewed, mutual decision to proceed with the following:  - Continue ***Humira 40mg  [place] weekly ??  - Continue*** metronidazole 250 mg TID. Counseled regarding risk of peripheral neuropathy with long-term use.  - Start*** triamcinolone (KENALOG) 0.1 % ointment; Apply topically Two (2) times a day. To affected area(s) prn for dermatitis in the gluteal cleft, appropriate use discussed  ??  High Risk Medication Use  -Quant gold negative 05/05/2018  -Ordered repeat Quant TB Gold sent to [facility], will be checked with his other bloodwork in the next week    The patient was advised to call for an appointment should any new, changing, or symptomatic lesions develop.     RTC: No follow-ups on file. or sooner as needed   _________________________________________________________________      Chief Complaint     Chief Complaint   Patient presents with   ??? Hidradenitis suppurativa      Pt states was seen at er for flares at the beginning of June.   No current flares.        HPI     Dustin Prince is a 60 y.o. male who presents as a returning patient (last seen by Dr. Janyth Contes on 01/31/2020) to [facility] for follow up of HS. At last visit, patient was instructed to continue Humira and metronidazole and was prescribed triamcinolone ointment.    Today, he reports***    The patient denies any other new or changing lesions or areas of concern.     Pertinent Past Medical History     Social History:  Current or former smoker? never  Amount smoking: n/a  How many years: n/a  ED visits in the last 5 years? 6-10  Difficulty affording medications? most of the time  Marital Status: in relationship  Living with some one? Yes.  ??  Prior treatments:  Topical: topical ABX  Systemic: clidnamycin; doxy (not very helpful),   Past surgical procedures: large WLE left axilla c/b dehisence   Past laser procedures: none    Past Medical History, Family History, Social History, Medication List, Allergies, and Problem List were reviewed in the rooming section of Epic.     ROS: Other than symptoms mentioned in the HPI, no fevers, chills, or other skin complaints    Physical Examination     OBJECTIVE:   Gen: Well-appearing patient, appropriate, interactive, in no acute distress  Skin: Examination of the scalp, face, neck, chest, back, abdomen, bilateral upper and lower extremities, hands, palms, soles, nails, buttocks, and external genitalia performed today and pertinent for:     location Abscess Inflamed nodule Non-inflamed nodule Draining sinus Non-draining Sinus Hurley BSA at site Color change  Inflamed induration Open skin surface  Tunnels   R axilla              L axilla  R inframammary              L inframammary              Intermammary              Pubic              R inguinal              R thigh              L inguinal              L thigh              Scrotum/Vulva              Perianal              R buttock              L buttock              Other (list)                            *0=none, 1=mild, 2=moderate, 3=severe    AN count (total sum of abscess and inflammatory nodule): ***  Pilonidal sinus (Y/N, or previously treated)? {pilonidal presence:55619}  Approximate BSA involved by inflammatory lesions: ***  Intertriginous comedones: {Desc; few - many:55616}  Diffuse comedones (trunk, face, etc): {Desc; few - many:55616}  Acne scars: {Desc; few - ZOXW:96045}  Cribriform scarring: {YES/NO:21013}  Intertrigionus epidermal inclusion cysts: {Desc; few - many:55618}  Diffuse (trunk, feace, extremities) epidermal inclusion cysts: {Desc; few - WUJW:11914}  Phenotype: {hsphenotype:64293}  -sites not commented on demonstrate normal findings.    Scribe's Attestation: [name], MD obtained and performed the history, physical exam and medical decision making elements that were entered into the chart.  Signed by Leeroy Bock Deitelzweig, Scribe, on ***.    {*** NOTE TO PROVIDER: PLEASE ADD ATTESTATION NOTING YOU AGREE WITH SCRIBE DOCUMENTATION}   (Approved Template 12/13/2019)

## 2020-10-08 NOTE — Unmapped (Signed)
For the HS:  - Inject 1 Humira pen weekly  - Take 1 metronidazole pill three times a day  - Apply triamcinolone ointment twice a day to affected areas as needed

## 2020-10-11 ENCOUNTER — Other Ambulatory Visit (HOSPITAL_COMMUNITY): Payer: Self-pay

## 2020-10-12 NOTE — Unmapped (Signed)
This encounter was created in error - please disregard.

## 2020-10-13 ENCOUNTER — Ambulatory Visit (INDEPENDENT_AMBULATORY_CARE_PROVIDER_SITE_OTHER): Payer: Medicaid Other | Admitting: Family

## 2020-10-13 ENCOUNTER — Other Ambulatory Visit: Payer: Self-pay

## 2020-10-13 ENCOUNTER — Encounter: Payer: Self-pay | Admitting: Family

## 2020-10-13 VITALS — BP 146/83 | HR 57 | Temp 97.6°F | Wt 336.8 lb

## 2020-10-13 DIAGNOSIS — B20 Human immunodeficiency virus [HIV] disease: Secondary | ICD-10-CM

## 2020-10-13 DIAGNOSIS — Z0000 Encounter for general adult medical examination without abnormal findings: Secondary | ICD-10-CM | POA: Insufficient documentation

## 2020-10-13 MED ORDER — CABOTEGRAVIR & RILPIVIRINE ER 600 & 900 MG/3ML IM SUER
1.0000 | Freq: Once | INTRAMUSCULAR | Status: AC
  Administered 2020-10-13: 1 via INTRAMUSCULAR

## 2020-10-13 NOTE — Assessment & Plan Note (Signed)
   Discussed importance of safe sexual practice to reduce risk of STI. Condoms declined.

## 2020-10-13 NOTE — Progress Notes (Signed)
Patient ID: Allen Espinoza, male    DOB: 06-Nov-1960, 60 y.o.   MRN: [id number]  Subjective:    Chief Complaint  Patient presents with   HIV Positive/AIDS   HPI:  Allen Espinoza is a 60 y.o. male with HIV disease currently maintained on [place] with last lab work completed on 08/17/20 with undetectable viral load and CD4 count of 440. Presents today for q 2 month injection.  Mr. Bromwell has been doing well since receiving his last injection with no adverse side effects. Overall feeling well today and is enjoying not having to take medications on a daily basis. Denies fevers, chills, night sweats, headaches, changes in vision, neck pain/stiffness, nausea, diarrhea, vomiting, lesions or rashes. Condoms offered. Declines STI testing.    Allergies  Allergen Reactions   Sulfa Antibiotics Other (See Comments)    Low blood pressure   Vancomycin Itching    Give with benadryl    Outpatient Medications Prior to Visit  Medication Sig Dispense Refill   cabotegravir & rilpivirine ER (CABENUVA) 600 & 900 MG/3ML injection INJECT 1 KIT INTO THE MUSCLE EVERY 8 (EIGHT) WEEKS. (Patient taking differently: Inject 1 kit into the muscle every 8 (eight) weeks.) 6 mL 6   furosemide (LASIX) 20 MG tablet Take 1 tablet by mouth once daily (Patient taking differently: Take 20 mg by mouth daily.) 90 tablet 0   HUMIRA PEN 80 MG/0.8ML PNKT Inject 80 mg into the skin once a week.     losartan (COZAAR) 50 MG tablet Take 50 mg by mouth daily.     metroNIDAZOLE (FLAGYL) 250 MG tablet Take 250 mg by mouth 3 (three) times daily.     XARELTO 20 MG TABS tablet TAKE 1 TABLET BY MOUTH ONCE DAILY WITH SUPPER (Patient taking differently: Take 20 mg by mouth daily with supper.) 90 tablet 0   No facility-administered medications prior to visit.    Past Medical History:  Diagnosis Date   Abscess    Between legs   Arthritis    all over   Asthma    Avascular necrosis of femoral head, left (HCC)    Blood transfusion without  reported diagnosis    Cholelithiasis    CKD (chronic kidney disease) stage 3, GFR 30-59 ml/min (HCC)    sees kidney Dr.   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[place])    left DVT   Diverticulosis    DVT (deep venous thrombosis) (HCC)    legs   Dyspnea    when walking   Dysrhythmia    remembers mother taking about having an irregular rhythm years when he was a child    GERD (gastroesophageal reflux disease)    HIV (human immunodeficiency virus infection) ([place])    Hypertension    Morbid obesity ([place])      Past Surgical History:  Procedure Laterality Date   COLONOSCOPY  2019   DENTAL SURGERY     had teeth pulled   HYDRADENITIS EXCISION Left 10/14/2016   Procedure: WIDE EXCISION HIDRADENITIS LEFT AXILLA;  Surgeon: Coralie Keens, MD;  Location: [facility];  Service: General;  Laterality: Left;   JOINT REPLACEMENT     Left hip Dr. Ninfa Linden 11/29/16   TOTAL HIP ARTHROPLASTY Left 11/29/2016   Procedure: LEFT TOTAL HIP ARTHROPLASTY ANTERIOR APPROACH;  Surgeon: [name], MD;  Location: [facility];  Service: Orthopedics;  Laterality: Left;     Review of Systems  Constitutional:  Negative for appetite change, chills, fatigue, fever and unexpected weight change.  Eyes:  Negative for visual disturbance.  Respiratory:  Negative for cough, chest tightness, shortness of breath and wheezing.   Cardiovascular:  Negative for chest pain and leg swelling.  Gastrointestinal:  Negative for abdominal pain, constipation, diarrhea, nausea and vomiting.  Genitourinary:  Negative for dysuria, flank pain, frequency, genital sores, hematuria and urgency.  Skin:  Negative for rash.  Allergic/Immunologic: Negative for immunocompromised state.  Neurological:  Negative for dizziness and headaches.     Objective:    BP (!) 146/83   Pulse (!) 57   Temp 97.6 F (36.4 C) (Oral)   Wt (!) 336 lb 12.8 oz (152.8 kg)   BMI 45.68 kg/m  Nursing note and vital signs reviewed.  Physical Exam Constitutional:       General: He is not in acute distress.    Appearance: He is well-developed. He is obese.  Eyes:     Conjunctiva/sclera: Conjunctivae normal.  Cardiovascular:     Rate and Rhythm: Normal rate and regular rhythm.     Heart sounds: Normal heart sounds. No murmur heard.   No friction rub. No gallop.  Pulmonary:     Effort: Pulmonary effort is normal. No respiratory distress.     Breath sounds: Normal breath sounds. No wheezing or rales.  Chest:     Chest wall: No tenderness.  Abdominal:     General: Bowel sounds are normal.     Palpations: Abdomen is soft.     Tenderness: no abdominal tenderness  Musculoskeletal:     Cervical back: Neck supple.  Lymphadenopathy:     Cervical: No cervical adenopathy.  Skin:    General: Skin is warm and dry.     Findings: No rash.  Neurological:     Mental Status: He is alert and oriented to person, place, and time.  Psychiatric:        Behavior: Behavior normal.        Thought Content: Thought content normal.        Judgment: Judgment normal.     Depression screen [facility] 2/9 09/28/2020 08/17/2020 06/16/2020 04/20/2020 02/15/2020  Decreased Interest 0 0 0 0 0  Down, Depressed, Hopeless 0 0 0 0 0  PHQ - 2 Score 0 0 0 0 0  Altered sleeping - - - - -  Tired, decreased energy - - - - -  Change in appetite - - - - -  Feeling bad or failure about yourself  - - - - -  Trouble concentrating - - - - -  Moving slowly or fidgety/restless - - - - -  Suicidal thoughts - - - - -  PHQ-9 Score - - - - -  Difficult doing work/chores - - - - -  Some recent data might be hidden       Assessment & Plan:    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 Jelinski has been doing well with no adverse side effects since his last injection of Cabenuva. Reviewed previous lab work and discussed continued plan of care. Check lab work today. Received next injection of Cabenuva without complication. Plan for follow up in 2 months or sooner if needed.        Relevant Orders   T-helper cell (CD4)- ([facility] only)   HIV-1 RNA quant-no reflex-bld   Healthcare maintenance    Discussed importance of safe sexual practice to reduce risk of STI. Condoms declined.          I am having Greggory D. Nolon Rod maintain his losartan, metroNIDAZOLE, cabotegravir & rilpivirine ER, Xarelto, furosemide, and Humira Pen. We administered cabotegravir & rilpivirine ER.   Meds ordered this encounter  Medications   cabotegravir & rilpivirine ER (CABENUVA) 600 & 900 MG/3ML injection 1 kit      Follow-up: Return in about 2 months (around 12/14/2020), or if symptoms worsen or fail to improve.   Terri Piedra, MSN, FNP-C Nurse Practitioner [facility]ious [facility] [place] number: [phone]

## 2020-10-13 NOTE — Assessment & Plan Note (Signed)
[name] has been doing well with no adverse side effects since his last injection of Cabenuva. Reviewed previous lab work and discussed continued plan of care. Check lab work today. Received next injection of Cabenuva without complication. Plan for follow up in 2 months or sooner if needed.

## 2020-10-13 NOTE — Patient Instructions (Signed)
Nice to see you.  We will check your lab work today.  Plan for follow up in 2 months or sooner if needed.   Have a great day and stay safe!  

## 2020-10-13 NOTE — Addendum Note (Signed)
Addended by: Caffie Pinto on: 10/13/2020 09:50 AM   Modules accepted: Orders

## 2020-10-16 LAB — T-HELPER CELLS (CD4) COUNT (NOT AT ARMC)
Absolute CD4: 403 cells/uL — ABNORMAL LOW (ref 490–1740)
CD4 T Helper %: 20 % — ABNORMAL LOW (ref 30–61)
Total lymphocyte count: 2043 cells/uL (ref 850–3900)

## 2020-10-16 LAB — HIV-1 RNA QUANT-NO REFLEX-BLD
HIV 1 RNA Quant: NOT DETECTED Copies/mL
HIV-1 RNA Quant, Log: NOT DETECTED Log cps/mL

## 2020-10-25 NOTE — Unmapped (Signed)
[facility] Refill Coordination Note    Specialty Medication(s) to be Shipped:   Inflammatory Disorders: Humira    Other medication(s) to be shipped: No additional medications requested for fill at this time     [name], DOB: Sep 20, 1960  Phone: [phone] (home)       All above HIPAA information was verified with patient.     Was a Nurse, learning disability used for this call? No    Completed refill call assessment today to schedule patient's medication shipment from the [facility] [phone]).  All relevant notes have been reviewed.     Specialty medication(s) and dose(s) confirmed: Regimen is correct and unchanged.   Changes to medications: [place] reports no changes at this time.  Changes to insurance: No  New side effects reported not previously addressed with a pharmacist or physician: None reported  Questions for the pharmacist: No    Confirmed patient received a Conservation officer, historic buildings and a Surveyor, mining with first shipment. The patient will receive a drug information handout for each medication shipped and additional FDA Medication Guides as required.       DISEASE/MEDICATION-SPECIFIC INFORMATION        For patients on injectable medications: Patient currently has 1 doses left.  Next injection is scheduled for 10/31/2020.    SPECIALTY MEDICATION ADHERENCE     Medication Adherence    Patient reported X missed doses in the last month: 0  Specialty Medication: Humira CF 80 mg/0.8 ml  Patient is on additional specialty medications: No  Any gaps in refill history greater than 2 weeks in the last 3 months: no  Demonstrates understanding of importance of adherence: yes  Informant: patient  Reliability of informant: reliable  Support network for adherence: family member  Confirmed plan for next specialty medication refill: delivery by pharmacy  Refills needed for supportive medications: not needed              Were doses missed due to medication being on hold? No    Humira CF 80 mg/0.8 mg/ml: 7 days of medicine on hand       REFERRAL TO PHARMACIST     Referral to the pharmacist: Not needed      [facility]     Shipping address confirmed in Epic.     Delivery Scheduled: Yes, Expected medication delivery date: 10/31/2020.     Medication will be delivered via UPS to the prescription address in Epic WAM.    Shamel Germond D Taariq Leitz   [facility] Specialty Technician

## 2020-10-30 MED FILL — HUMIRA(CF) PEN 80 MG/0.8 ML SUBCUTANEOUS KIT: 28 days supply | Qty: 4 | Fill #4

## 2020-11-01 ENCOUNTER — Encounter: Payer: Self-pay | Admitting: Neurology

## 2020-11-01 ENCOUNTER — Ambulatory Visit: Payer: Medicaid Other | Admitting: Neurology

## 2020-11-01 VITALS — BP 134/78 | HR 54 | Ht 72.0 in | Wt 336.8 lb

## 2020-11-01 DIAGNOSIS — R202 Paresthesia of skin: Secondary | ICD-10-CM

## 2020-11-01 DIAGNOSIS — G4752 REM sleep behavior disorder: Secondary | ICD-10-CM | POA: Diagnosis not present

## 2020-11-01 MED ORDER — CLONAZEPAM 0.5 MG PO TABS: 1.0000 mg | ORAL_TABLET | Freq: Every day | ORAL | 3 refills | Status: DC
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
## 2020-11-01 NOTE — Patient Instructions (Addendum)
You had a brain MRI in June 2020.  Since you have had these intermittent tingling sensations around your head, we can try to get your insurance to approve you for a repeat brain MRI.  We will do this without contrast given your mild kidney impairment.  We will call to schedule your MRI once we have the authorization.  For your treatment enactment behavior you have tried melatonin in the past.  You had also tried low-dose clonazepam, I suggest that we restart this at 0.5 mg strength with gradual titration.  Please start with clonazepam 0.5 mg at bedtime and after 2 weeks you can increase it to 1 pill at bedtime thereafter.

## 2020-11-03 ENCOUNTER — Other Ambulatory Visit: Payer: Self-pay | Admitting: Internal Medicine

## 2020-11-03 DIAGNOSIS — I82409 Acute embolism and thrombosis of unspecified deep veins of unspecified lower extremity: Secondary | ICD-10-CM

## 2020-11-03 DIAGNOSIS — I825Z2 Chronic embolism and thrombosis of unspecified deep veins of left distal lower extremity: Secondary | ICD-10-CM

## 2020-11-03 NOTE — Telephone Encounter (Signed)
Dray with [facility] called in requesting refill on xarelto be filled today as patient is completely out.

## 2020-11-13 ENCOUNTER — Other Ambulatory Visit: Payer: Self-pay | Admitting: Student

## 2020-11-15 ENCOUNTER — Telehealth: Payer: Self-pay

## 2020-11-15 NOTE — Telephone Encounter (Signed)
Pt is requesting his furosemide (LASIX) 20 MG tablet sent to  [facility], [place] Phone:  [phone]  Fax:  [phone]

## 2020-11-20 DIAGNOSIS — N189 Chronic kidney disease, unspecified: Secondary | ICD-10-CM | POA: Diagnosis not present

## 2020-11-20 DIAGNOSIS — N183 Chronic kidney disease, stage 3 unspecified: Secondary | ICD-10-CM | POA: Diagnosis not present

## 2020-11-21 ENCOUNTER — Telehealth: Payer: Self-pay | Admitting: Neurology

## 2020-11-21 NOTE — Telephone Encounter (Signed)
11/21/20 [facility] blue Josem [id number] (exp. 11/01/20 to 12/06/20) order sent to GI they will reach out to the patient to schedule.   8/3 medicaid healthy blue pending tracking [id number] ref [facility] [id number]  8/4 faxed OV notes to healthy blue

## 2020-11-21 NOTE — Unmapped (Signed)
[facility] Refill Coordination Note    Specialty Medication(s) to be Shipped:   Inflammatory Disorders: Humira    Other medication(s) to be shipped: No additional medications requested for fill at this time     [name], DOB: [birth year]  Phone: [phone] (home)       All above HIPAA information was verified with patient.     Was a Nurse, learning disability used for this call? No    Completed refill call assessment today to schedule patient's medication shipment from the [facility] [phone]).  All relevant notes have been reviewed.     Specialty medication(s) and dose(s) confirmed: Regimen is correct and unchanged.   Changes to medications: Coury reports no changes at this time.  Changes to insurance: No  New side effects reported not previously addressed with a pharmacist or physician: None reported  Questions for the pharmacist: No    Confirmed patient received a Conservation officer, historic buildings and a Surveyor, mining with first shipment. The patient will receive a drug information handout for each medication shipped and additional FDA Medication Guides as required.       DISEASE/MEDICATION-SPECIFIC INFORMATION        For patients on injectable medications: Patient currently has 1 doses left.  Next injection is scheduled for 11/28/2020.    SPECIALTY MEDICATION ADHERENCE     Medication Adherence    Patient reported X missed doses in the last month: 0  Specialty Medication: Humira CF 80 mg/0.8 ml  Patient is on additional specialty medications: No  Any gaps in refill history greater than 2 weeks in the last 3 months: no  Demonstrates understanding of importance of adherence: yes  Informant: patient  Reliability of informant: reliable  Support network for adherence: family member  Confirmed plan for next specialty medication refill: delivery by pharmacy  Refills needed for supportive medications: not needed              Were doses missed due to medication being on hold? No    Humira CF 80 mg/0.8 mg/ml: 7 days of medicine on hand       REFERRAL TO PHARMACIST     Referral to the pharmacist: Not needed      [facility]     Shipping address confirmed in Epic.     Delivery Scheduled: Yes, Expected medication delivery date: 11/23/2020.     Medication will be delivered via UPS to the prescription address in Epic WAM.    Ladislao Cohenour D Ihan Pat   [facility] Specialty Technician

## 2020-11-22 MED FILL — HUMIRA(CF) PEN 80 MG/0.8 ML SUBCUTANEOUS KIT: 28 days supply | Qty: 4 | Fill #5

## 2020-11-23 ENCOUNTER — Other Ambulatory Visit: Payer: Self-pay

## 2020-11-23 ENCOUNTER — Ambulatory Visit
Admission: RE | Admit: 2020-11-23 | Discharge: 2020-11-23 | Disposition: A | Discharge status: Home / Self Care | Payer: Medicaid Other | Source: Ambulatory Visit | Attending: Neurology | Admitting: Neurology

## 2020-11-23 DIAGNOSIS — G4752 REM sleep behavior disorder: Secondary | ICD-10-CM

## 2020-11-23 DIAGNOSIS — R202 Paresthesia of skin: Secondary | ICD-10-CM | POA: Diagnosis not present

## 2020-12-05 ENCOUNTER — Other Ambulatory Visit (HOSPITAL_COMMUNITY): Payer: Self-pay

## 2020-12-05 MED FILL — Cabotegravir 600 MG/3ML & Rilpivirine 900 MG/3ML IM Susp ER: INTRAMUSCULAR | 34 days supply | Qty: 6 | Fill #2 | Status: AC

## 2020-12-06 ENCOUNTER — Telehealth: Payer: Self-pay

## 2020-12-06 NOTE — Telephone Encounter (Signed)
[facility] Patient Advocate Encounter  Patient's medication(Cabenuva) have been couriered to [facility] from [place] and will be administered on patient next office visit on 12/14/20.  [name] , [facility] Patient [facility] Phone: [phone] Fax:  [phone]

## 2020-12-14 ENCOUNTER — Ambulatory Visit (INDEPENDENT_AMBULATORY_CARE_PROVIDER_SITE_OTHER): Payer: Medicaid Other | Admitting: Pharmacist

## 2020-12-14 ENCOUNTER — Other Ambulatory Visit: Payer: Self-pay

## 2020-12-14 DIAGNOSIS — Z23 Encounter for immunization: Secondary | ICD-10-CM | POA: Diagnosis not present

## 2020-12-14 DIAGNOSIS — B20 Human immunodeficiency virus [HIV] disease: Secondary | ICD-10-CM

## 2020-12-14 MED ORDER — CABOTEGRAVIR & RILPIVIRINE ER 600 & 900 MG/3ML IM SUER
1.0000 | Freq: Once | INTRAMUSCULAR | Status: AC
  Administered 2020-12-14: 1 via INTRAMUSCULAR

## 2020-12-14 NOTE — Progress Notes (Signed)
12/14/2020  HPI: Allen Espinoza is a 60 y.o. male who presents to the [place] clinic for HIV follow-up.  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 ([place]) 08/19/2016   Morbid (severe) obesity due to excess calories ([place]) 08/19/2016   Solitary pulmonary nodule 05/06/2016   Hidradenitis suppurativa 04/27/2016   Chest pain 04/26/2016   Avascular necrosis of bone of hip, left s/p TRH ([place]) 03/19/2016   Human immunodeficiency virus (HIV) disease ([place]) 03/18/2016   Chronic kidney disease 03/18/2016   Cholelithiasis 03/18/2016   Diverticulosis of colon without hemorrhage 03/18/2016    Patient's Medications  New Prescriptions   No medications on file  Previous Medications   CABOTEGRAVIR & RILPIVIRINE ER (CABENUVA) 600 & 900 MG/3ML INJECTION    INJECT 1 KIT INTO THE MUSCLE EVERY 8 (EIGHT) WEEKS.   CLONAZEPAM (KLONOPIN) 0.5 MG TABLET    Take 2 tablets (1 mg total) by mouth at bedtime. Follow titration instructions provided separately in writing.   FUROSEMIDE (LASIX) 20 MG TABLET    Take 1 tablet by mouth once daily   HUMIRA PEN 80 MG/0.8ML PNKT    Inject 80 mg into the skin once a week.   LOSARTAN (COZAAR) 50 MG TABLET    Take 50 mg by mouth daily.   METRONIDAZOLE (FLAGYL) 250 MG TABLET    Take 250 mg by mouth 3 (three) times daily.   RIVAROXABAN (XARELTO) 20 MG TABS TABLET    Take 1 tablet (20 mg total) by mouth daily with supper.  Modified Medications   No medications on file  Discontinued Medications   No medications on file    Allergies: Allergies  Allergen Reactions   Sulfa Antibiotics Other (See Comments)    Low blood pressure   Vancomycin Itching    Give with benadryl    Past Medical History: Past Medical History:  Diagnosis  Date   Abscess    Between legs   Arthritis    all over   Asthma    Avascular necrosis of femoral head, left (HCC)    Blood transfusion without reported diagnosis    Cholelithiasis    CKD (chronic kidney disease) stage 3, GFR 30-59 ml/min (HCC)    sees kidney Dr.   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[place])    left DVT   Diverticulosis    DVT (deep venous thrombosis) (HCC)    legs   Dyspnea    when walking   Dysrhythmia    remembers mother taking about having an irregular rhythm years when he was a child    GERD (gastroesophageal reflux disease)    HIV (human immunodeficiency virus infection) ([place])    Hypertension    Morbid obesity ([place])     Social History: Social History   Socioeconomic History   Marital status: Single    Spouse name: Not on file   Number of children: 2   Years of education: Not on file   Highest education level: Not on file  Occupational History   Occupation: Disability  Tobacco Use   Smoking status: Never   Smokeless tobacco: Never  Vaping Use   Vaping Use: Never used  Substance and Sexual Activity   Alcohol use: No    Comment: prior   Drug use: No  Comment: prior cocaine use, last 2005   Sexual activity: Not Currently    Partners: Female    Birth control/protection: Condom    Comment: declined condoms 05/2020  Other Topics Concern   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, mailroom, warehouse work   Lives w/ fiancee   No EtOH, tobacco, drugs   Social Determinants of Health   Financial Resource Strain: Not on file  Food Insecurity: Not on file  Transportation Needs: Not on file  Physical Activity: Not on file  Stress: Not on file  Social Connections: Not on file    Labs: Lab Results  Component Value Date   HIV1RNAQUANT Not Detected 10/13/2020   HIV1RNAQUANT Not Detected 08/17/2020   HIV1RNAQUANT <20 05/19/2020   HIV1RNAVL 47,300 03/18/2016   CD4TABS 440 08/17/2020   CD4TABS 345 (L) 02/01/2020   CD4TABS 401  03/03/2019    RPR and STI Lab Results  Component Value Date   LABRPR NON-REACTIVE 02/01/2020   LABRPR NON-REACTIVE 08/18/2018   LABRPR NON-REACTIVE 05/01/2017   LABRPR NON REAC 08/07/2016    STI Results GC CT  02/01/2020 Negative Negative  03/03/2019 Negative Negative  05/01/2017 Negative Negative  08/07/2016 Negative Negative  04/26/2016 Negative Negative    Hepatitis B Lab Results  Component Value Date   HEPBSAB REACTIVE (A) 11/19/2016   HEPBSAG NON-REACTIVE 11/19/2016   Hepatitis C No results found for: HEPCAB, HCVRNAPCRQN Hepatitis A No results found for: HAV Lipids: Lab Results  Component Value Date   CHOL 162 08/18/2018   TRIG 114 08/18/2018   HDL 60 08/18/2018   CHOLHDL 2.7 08/18/2018   VLDL 16 08/07/2016   LDLCALC 81 08/18/2018    Current HIV Regimen: Cabenuva 600/900 IM   Assessment: Patient presents to clinic 12/14/20 for his 2 month scheduled Cabenuva injection. Patient did not report any complaints with injections . Patient reports no new partners recently and declined STI screening. HIV RNA was collected at this visit and is currently pending.   Patient reports recently getting his first shingles vaccine. When asked if patient would like to receive his first monkeypox vaccine, patient answered in the affirmative and his first vaccine was administered intradermally, and a follow-up appointment for his second monkeypox vaccine was scheduled for 01/11/21. Patient was informed he may have a bump in his skin for a few weeks and he may be a bit itchy, but is otherwise generally tolerated pretty well. His 10-monthCabenuva follow-up was also scheduled in clinic for 02/13/21.   Plan: Jynneos 0.1 mL intradermally x1  Cabenuva 600/900 mg IM x1  2nd monkeypox vaccine 01/11/21 229-monthabenuva follow-up 02/13/21 Follow-up HIV RNA   AuAdria DillPharmD PGY-1 Acute Care Resident  12/14/2020 1:43 PM

## 2020-12-15 ENCOUNTER — Encounter: Payer: Medicaid Other | Admitting: Family

## 2020-12-15 NOTE — Telephone Encounter (Signed)
Pt called wanting a call to go over his MRI results once we get them.

## 2020-12-15 NOTE — Unmapped (Signed)
[facility] Refill Coordination Note    Specialty Medication(s) to be Shipped:   Inflammatory Disorders: Humira    Other medication(s) to be shipped: No additional medications requested for fill at this time     Dustin Prince, DOB: Mar 17, 1961  Phone: [phone] (home)       All above HIPAA information was verified with patient.     Was a Nurse, learning disability used for this call? No    Completed refill call assessment today to schedule patient's medication shipment from the [facility] [phone]).  All relevant notes have been reviewed.     Specialty medication(s) and dose(s) confirmed: Regimen is correct and unchanged.   Changes to medications: Dustin Prince reports no changes at this time.  Changes to insurance: No  New side effects reported not previously addressed with a pharmacist or physician: None reported  Questions for the pharmacist: No    Confirmed patient received a Conservation officer, historic buildings and a Surveyor, mining with first shipment. The patient will receive a drug information handout for each medication shipped and additional FDA Medication Guides as required.       DISEASE/MEDICATION-SPECIFIC INFORMATION        For patients on injectable medications: Patient currently has 3 doses left.  Next injection is scheduled for 12/19/2020.    SPECIALTY MEDICATION ADHERENCE     Medication Adherence    Patient reported X missed doses in the last month: 0  Specialty Medication: Humira CF 80 mg/0.8 ml  Patient is on additional specialty medications: No  Any gaps in refill history greater than 2 weeks in the last 3 months: no  Demonstrates understanding of importance of adherence: yes  Informant: patient  Reliability of informant: reliable  Support network for adherence: family member  Confirmed plan for next specialty medication refill: delivery by pharmacy  Refills needed for supportive medications: not needed              Were doses missed due to medication being on hold? No    Humira CF 80 mg/0.8 mg/ml: 21 days of medicine on hand       REFERRAL TO PHARMACIST     Referral to the pharmacist: Not needed      [facility]     Shipping address confirmed in Epic.     Delivery Scheduled: Yes, Expected medication delivery date: 12/26/2020.     Medication will be delivered via UPS to the prescription address in Epic WAM.    Dustin Prince Dustin Prince Dustin Prince   [facility] Specialty Technician

## 2020-12-18 LAB — HIV-1 RNA QUANT-NO REFLEX-BLD
HIV 1 RNA Quant: NOT DETECTED Copies/mL
HIV-1 RNA Quant, Log: NOT DETECTED Log cps/mL

## 2020-12-18 NOTE — Telephone Encounter (Signed)
Hi, Mr. Ashabranner,  Your recent brain MRI without contrast did not show any acute findings, overall stable findings from June 2020 which is reassuring.  Written by [name], MD on 11/27/2020  8:00 AM EDT Seen by patient Rogene Houston on 12/14/2020  9:10 PM

## 2020-12-25 MED FILL — HUMIRA(CF) PEN 80 MG/0.8 ML SUBCUTANEOUS KIT: 28 days supply | Qty: 4 | Fill #6

## 2021-01-11 ENCOUNTER — Other Ambulatory Visit: Payer: Self-pay

## 2021-01-11 ENCOUNTER — Ambulatory Visit (INDEPENDENT_AMBULATORY_CARE_PROVIDER_SITE_OTHER): Payer: Medicaid Other | Admitting: Pharmacist

## 2021-01-11 DIAGNOSIS — Z23 Encounter for immunization: Secondary | ICD-10-CM | POA: Diagnosis present

## 2021-01-11 NOTE — Progress Notes (Signed)
   01/11/2021  HPI: Allen Espinoza is a 60 y.o. male who presents to the [place] clinic for monkeypox immunization.   [name] was observed post immunization for 15 minutes without incident. He was provided with Vaccine Information Sheet and instruction to access the V-Safe system.   [name] was instructed to call 911 with any severe reactions post vaccine: Difficulty breathing  Swelling of face and throat  A fast heartbeat  A bad rash all over body  Dizziness and weakness   Alfonse Spruce, PharmD, CPP Clinical Pharmacist Practitioner Infectious Diseases [place] [facility]   01/11/2021, 9:19 AM

## 2021-01-26 NOTE — Unmapped (Signed)
Dustin Prince reports the higher 80 mg/week Humira has helped with his flaring, and he's not had many new flares. He does continue to have some bleeding and drainage from his scrotum that's about the same as before. He wants to talk with Dr. Janyth Contes about possible surgery options to help.    I transferred him to the appointment line today to reschedule.    He continues to take metronidazole TID, but denies symptoms of peripheral neuropathy.    [facility] Clinical Assessment & Refill Coordination Note    Dustin Prince, [place]: 03-16-1961  Phone: [phone] (home)     All above HIPAA information was verified with patient.     Was a Nurse, learning disability used for this call? No    Specialty Medication(s):   Inflammatory Disorders: Humira     Current Outpatient Medications   Medication Sig Dispense Refill   ??? adalimumab 80 mg/0.8 mL PnKt Inject the contents of 1 pen (80mg ) under the skin weekly. 4 each 11   ??? albuterol HFA 90 mcg/actuation inhaler Inhale 2 puffs.     ??? bictegrav-emtricit-tenofov ala (BIKTARVY) 50-200-25 mg tablet Take 1 tablet by mouth daily.     ??? CLONAZEPAM ORAL Take by mouth.     ??? cyclobenzaprine (FLEXERIL) 5 MG tablet TAKE 1 TABLET BY MOUTH AT BEDTIME     ??? furosemide (LASIX) 20 MG tablet Take 20 mg by mouth daily.     ??? gentamicin, PF, (GARAMYCIN) 10 mg/mL Soln Take 2 vials to dermatology appointment three times a week as directed 20 mL 10   ??? HYDROcodone-acetaminophen (NORCO) 5-325 mg per tablet Take 1-2 tablets every 6 hours as needed for pain 15 tablet 0   ??? losartan (COZAAR) 50 MG tablet      ??? metroNIDAZOLE (FLAGYL) 250 MG tablet Take 1 tablet (250 mg total) by mouth Three (3) times a day. 270 tablet 2   ??? pantoprazole (PROTONIX) 40 MG tablet Take 40 mg by mouth daily.     ??? rivaroxaban (XARELTO) 20 mg tablet Take 20 mg by mouth daily with evening meal.     ??? triamcinolone (KENALOG) 0.1 % ointment Apply topically Two (2) times a day. To affected area(s) prn for rash 80 g 1     No current facility-administered medications for this visit.        Changes to medications: [name] reports no changes at this time.    Allergies   Allergen Reactions   ??? Sulfasalazine      Other reaction(s): Other (See Comments)  Low blood pressure   ??? Vancomycin Itching     Give with benadryl       Changes to allergies: No    SPECIALTY MEDICATION ADHERENCE     Humira - 1 left    Medication Adherence    Patient reported X missed doses in the last month: 0  Specialty Medication: Humira  Support network for adherence: family member          Specialty medication(s) dose(s) confirmed: Regimen is correct and unchanged.     Are there any concerns with adherence? No    Adherence counseling provided? Not needed    CLINICAL MANAGEMENT AND INTERVENTION      Clinical Benefit Assessment:    Do you feel the medicine is effective or helping your condition? Yes    Clinical Benefit counseling provided? Not needed    Adverse Effects Assessment:    Are you experiencing any side effects? No  Are you experiencing difficulty administering your medicine? No    Quality of Life Assessment:    Quality of Life    Rheumatology  Oncology  Dermatology  1. What impact has your specialty medication had on the symptoms of your skin condition (i.e. itchiness, soreness, stinging)?: Some  2. What impact has your specialty medication had on your comfort level with your skin?: Some  Cystic Fibrosis          Have you discussed this with your provider? Not needed    Acute Infection Status:    Acute infections noted within Epic:  No active infections  Patient reported infection: None    Therapy Appropriateness:    Is therapy appropriate and patient progressing towards therapeutic goals? Yes, therapy is appropriate and should be continued    DISEASE/MEDICATION-SPECIFIC INFORMATION      For patients on injectable medications: Patient currently has 1 doses left.  Next injection is scheduled for Tues, 11/1.    PATIENT SPECIFIC NEEDS     - Does the patient have any physical, cognitive, or cultural barriers? No    - Is the patient high risk? No    - Does the patient require a Care Management Plan? No     - Does the patient require physician intervention or other additional services (i.e. nutrition, smoking cessation, social work)? No      SHIPPING     Specialty Medication(s) to be Shipped:   Inflammatory Disorders: Humira    Other medication(s) to be shipped: No additional medications requested for fill at this time     Changes to insurance: No    Delivery Scheduled: Yes, Expected medication delivery date: Wed, 11/2.     Medication will be delivered via UPS to the confirmed prescription address in [facility].    The patient will receive a drug information handout for each medication shipped and additional FDA Medication Guides as required.  Verified that patient has previously received a Conservation officer, historic buildings and a Surveyor, mining.    The patient or caregiver noted above participated in the development of this care plan and knows that they can request review of or adjustments to the care plan at any time.      All of the patient's questions and concerns have been addressed.    Dustin Prince   [name] Shared [facility] Specialty Pharmacist

## 2021-01-30 MED FILL — HUMIRA(CF) PEN 80 MG/0.8 ML SUBCUTANEOUS KIT: 28 days supply | Qty: 4 | Fill #7

## 2021-02-01 ENCOUNTER — Other Ambulatory Visit (HOSPITAL_COMMUNITY): Payer: Self-pay

## 2021-02-05 ENCOUNTER — Other Ambulatory Visit (HOSPITAL_COMMUNITY): Payer: Self-pay

## 2021-02-05 MED FILL — Cabotegravir 600 MG/3ML & Rilpivirine 900 MG/3ML IM Susp ER: INTRAMUSCULAR | 34 days supply | Qty: 6 | Fill #3 | Status: AC

## 2021-02-06 ENCOUNTER — Telehealth: Payer: Self-pay

## 2021-02-06 NOTE — Telephone Encounter (Signed)
[facility] Patient Advocate Encounter  Patient's medication (Cabenuva)have been couriered to [facility] from [place] and will be asministered on patient next office visit on 02/13/21.  Allen Espinoza , [place] [facility] Patient [facility] for Infectious Disease Phone: [phone] Fax:  [phone]

## 2021-02-13 ENCOUNTER — Ambulatory Visit (INDEPENDENT_AMBULATORY_CARE_PROVIDER_SITE_OTHER): Payer: Medicaid Other | Admitting: Pharmacist

## 2021-02-13 ENCOUNTER — Other Ambulatory Visit: Payer: Self-pay | Admitting: Internal Medicine

## 2021-02-13 ENCOUNTER — Other Ambulatory Visit (HOSPITAL_COMMUNITY)
Admission: RE | Admit: 2021-02-13 | Discharge: 2021-02-13 | Disposition: A | Discharge status: Home / Self Care | Payer: Medicaid Other | Source: Ambulatory Visit | Attending: Infectious Diseases | Admitting: Infectious Diseases

## 2021-02-13 ENCOUNTER — Other Ambulatory Visit: Payer: Self-pay

## 2021-02-13 DIAGNOSIS — B20 Human immunodeficiency virus [HIV] disease: Secondary | ICD-10-CM | POA: Diagnosis not present

## 2021-02-13 MED ORDER — CABOTEGRAVIR & RILPIVIRINE ER 600 & 900 MG/3ML IM SUER
1.0000 | Freq: Once | INTRAMUSCULAR | Status: AC
  Administered 2021-02-13: 1 via INTRAMUSCULAR

## 2021-02-13 NOTE — Progress Notes (Signed)
HPI: Allen Espinoza is a 60 y.o. male who presents to the [place] clinic for [place] administration.  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 ([place]) 08/19/2016   Morbid (severe) obesity due to excess calories ([place]) 08/19/2016   Solitary pulmonary nodule 05/06/2016   Hidradenitis suppurativa 04/27/2016   Chest pain 04/26/2016   Avascular necrosis of bone of hip, left s/p TRH ([place]) 03/19/2016   Human immunodeficiency virus (HIV) disease ([place]) 03/18/2016   Chronic kidney disease 03/18/2016   Cholelithiasis 03/18/2016   Diverticulosis of colon without hemorrhage 03/18/2016    Patient's Medications  New Prescriptions   No medications on file  Previous Medications   CABOTEGRAVIR & RILPIVIRINE ER (CABENUVA) 600 & 900 MG/3ML INJECTION    INJECT 1 KIT INTO THE MUSCLE EVERY 8 (EIGHT) WEEKS.   CLONAZEPAM (KLONOPIN) 0.5 MG TABLET    Take 2 tablets (1 mg total) by mouth at bedtime. Follow titration instructions provided separately in writing.   FUROSEMIDE (LASIX) 20 MG TABLET    Take 1 tablet by mouth once daily   HUMIRA PEN 80 MG/0.8ML PNKT    Inject 80 mg into the skin once a week.   LOSARTAN (COZAAR) 50 MG TABLET    Take 50 mg by mouth daily.   METRONIDAZOLE (FLAGYL) 250 MG TABLET    Take 250 mg by mouth 3 (three) times daily.   RIVAROXABAN (XARELTO) 20 MG TABS TABLET    Take 1 tablet (20 mg total) by mouth daily with supper.  Modified Medications   No medications on file  Discontinued Medications   No medications on file    Allergies: Allergies  Allergen Reactions   Sulfa Antibiotics Other (See Comments)    Low blood pressure   Vancomycin Itching    Give with benadryl    Past Medical History: Past Medical History:  Diagnosis Date    Abscess    Between legs   Arthritis    all over   Asthma    Avascular necrosis of femoral head, left (HCC)    Blood transfusion without reported diagnosis    Cholelithiasis    CKD (chronic kidney disease) stage 3, GFR 30-59 ml/min (HCC)    sees kidney Dr.   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[place])    left DVT   Diverticulosis    DVT (deep venous thrombosis) (HCC)    legs   Dyspnea    when walking   Dysrhythmia    remembers mother taking about having an irregular rhythm years when he was a child    GERD (gastroesophageal reflux disease)    HIV (human immunodeficiency virus infection) ([place])    Hypertension    Morbid obesity ([place])     Social History: Social History   Socioeconomic History   Marital status: Single    Spouse name: Not on file   Number of children: 2   Years of education: Not on file   Highest education level: Not on file  Occupational History   Occupation: Disability  Tobacco Use   Smoking status: Never   Smokeless tobacco: Never  Vaping Use   Vaping Use: Never used  Substance and Sexual Activity   Alcohol use: No    Comment: prior   Drug use: No    Comment:  prior cocaine use, last 2005   Sexual activity: Not Currently    Partners: Female    Birth control/protection: Condom    Comment: declined condoms 05/2020  Other Topics Concern   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, mailroom, warehouse work   Lives w/ fiancee   No EtOH, tobacco, drugs   Social Determinants of Health   Financial Resource Strain: Not on file  Food Insecurity: Not on file  Transportation Needs: Not on file  Physical Activity: Not on file  Stress: Not on file  Social Connections: Not on file    Labs: Lab Results  Component Value Date   HIV1RNAQUANT Not Detected 12/14/2020   HIV1RNAQUANT Not Detected 10/13/2020   HIV1RNAQUANT Not Detected 08/17/2020   HIV1RNAVL 47,300 03/18/2016   CD4TABS 440 08/17/2020   CD4TABS 345 (L) 02/01/2020   CD4TABS  401 03/03/2019    RPR and STI Lab Results  Component Value Date   LABRPR NON-REACTIVE 02/01/2020   LABRPR NON-REACTIVE 08/18/2018   LABRPR NON-REACTIVE 05/01/2017   LABRPR NON REAC 08/07/2016    STI Results GC CT  02/01/2020 Negative Negative  03/03/2019 Negative Negative  05/01/2017 Negative Negative  08/07/2016 Negative Negative  04/26/2016 Negative Negative    Hepatitis B Lab Results  Component Value Date   HEPBSAB REACTIVE (A) 11/19/2016   HEPBSAG NON-REACTIVE 11/19/2016   Hepatitis C No results found for: HEPCAB, HCVRNAPCRQN Hepatitis A No results found for: HAV Lipids: Lab Results  Component Value Date   CHOL 162 08/18/2018   TRIG 114 08/18/2018   HDL 60 08/18/2018   CHOLHDL 2.7 08/18/2018   VLDL 16 08/07/2016   LDLCALC 81 08/18/2018    TARGET DATE:  The 20th of the month  Current HIV Regimen: Cabenuva  Assessment: Kaheem presents today for their maintenance Cabenuva injections. Initial/past injections were tolerated well without issues. No problems with systemic effects of injections. Patient did experience some mild soreness that subsided after a couple of days.   Administered cabotegravir 67m/3mL in left upper outer quadrant of the gluteal muscle. Administered rilpivirine 900 mg/368min the right upper outer quadrant of the gluteal muscle. Monitored patient for 10 minutes after injection. Injections were tolerated well without issue. Patient will follow up in 2 months for next injection.  Plan: - Cabenuva injections administered -Check HIV RNA and Urine Cytology - Next injections scheduled for 1/12 @ 9:15 with AmEstill Bamberg Call with any issues or questions  AlLestine BoxPharmD PGY2 Infectious Diseases Pharmacy Resident

## 2021-02-14 LAB — URINE CYTOLOGY ANCILLARY ONLY
Chlamydia: NEGATIVE
Comment: NEGATIVE
Comment: NORMAL
Neisseria Gonorrhea: NEGATIVE

## 2021-02-15 LAB — HIV-1 RNA QUANT-NO REFLEX-BLD
HIV 1 RNA Quant: NOT DETECTED Copies/mL
HIV-1 RNA Quant, Log: NOT DETECTED Log cps/mL

## 2021-02-19 DIAGNOSIS — N189 Chronic kidney disease, unspecified: Secondary | ICD-10-CM | POA: Diagnosis not present

## 2021-02-19 DIAGNOSIS — N183 Chronic kidney disease, stage 3 unspecified: Secondary | ICD-10-CM | POA: Diagnosis not present

## 2021-02-19 DIAGNOSIS — L732 Hidradenitis suppurativa: Principal | ICD-10-CM

## 2021-02-19 MED ORDER — METRONIDAZOLE 250 MG TABLET
ORAL_TABLET | 0 refills | 0.00000 days | Status: CP
Start: 2021-02-19 — End: ?

## 2021-02-20 NOTE — Patient Instructions (Signed)
Below is our plan:  We will continue clonazepam 0.5mg  (one tablet) daily. Call pharmacy for refill when due. Last rx filled 11/15 for 60 tablets.   Please make sure you are staying well hydrated. I recommend 50-60 ounces daily. Well balanced diet and regular exercise encouraged. Consistent sleep schedule with 6-8 hours recommended.   Please continue follow up with care team as directed.   Follow up with me in 6 months   You may receive a survey regarding today's visit. I encourage you to leave honest feed back as I do use this information to improve patient care. Thank you for seeing me today!

## 2021-02-20 NOTE — Progress Notes (Signed)
Chief Complaint  Patient presents with   Follow-up    Rm 1, alone. Here for 4 month f/u. Pt reports doing well. No new or worsening in sx.      HISTORY OF PRESENT ILLNESS:  02/21/21 ALL:  Allen Espinoza is a 60 y.o. male here today for follow up for RBD. He was last seen by Dr Rexene Alberts 10/2020. He had been off clonazepam for about 2 years as he did not feel it was effective. He reported vivid dreams had significantly worsened over this period of time. He was having more movements/enactment of dreams. He was advised to restart clonazepam 0.13m and titrate to 169mdaily at bedtime. He reports sleeping better. He is no longer acting out dreams. He continues to sleep in separate bedroom from his wife. She has told him that he does talk in his sleep but has not seen any restless behavior. He was not Espinoza to tolerate 10m34mnd has continued 0.5mg40mily. He is tolerting it well. Last refill 02/13/21 for 60 tablets. He does have intermittent numbness/tingling of his head. It can be right or left sided and occurs in different places each time. Symptoms do seem better and less frequent since last visit.    HISTORY (copied from Dr AthaGuadelupe Sabinvious note)  Allen Espinoza 59 y57r old right-handed gentleman with an underlying complex medical history of arthritis, avascular necrosis of the left femoral head, cholelithiasis, chronic kidney disease, diverticulosis, HIV, history of DVT, and morbid obesity with BMI of over 40, 52o presents for follow-up consultation of his sleep disorder, in particular, vivid dreams and dream enactment behaviors, in keeping with RBD.  The patient is unaccompanied today and presents after a longer gap of nearly 2 years.  I last saw him on 12/22/2018, at which time he reported that clonazepam was not effective and he had stopped using it.  He had no repercussions after stopping it.  He was advised to follow-up as needed.  He is referred by his primary care physician for reevaluation.   Today,  11/01/2020: He reports that his vivid dreams have become worse in the last few months.  Per fianc, he has had more movements and acting out in his dreams.  They typically do not sleep in the same bed together.  He snores to some degree.  He denies waking up with a gasping sensation or having apneas that are witnessed.  He has been on an injectable medication for his HIV.  He has had intermittent tingling around his head.  He describes a zap like sensation, no actual headaches, denies any one-sided weakness or numbness or tingling or droopy face or slurring of speech otherwise.  He had a brain MRI without contrast in June 2020 which was reassuring.  He overall feels clumsy, balance is not as good but has not had any falls.  He would be willing to try clonazepam again.  He was on 0.5 mg at bedtime when he started due to not being effective.  He had also tried melatonin in the past when we first started treatment for his REM behavior disorder.   REVIEW OF SYSTEMS: Out of a complete 14 system review of symptoms, the patient complains only of the following symptoms, intermittent tingling/numbness of head, restless sleep and all other reviewed systems are negative.   ALLERGIES: Allergies  Allergen Reactions   Sulfa Antibiotics Other (See Comments)    Low blood pressure   Vancomycin Itching    Give with benadryl  HOME MEDICATIONS: Outpatient Medications Prior to Visit  Medication Sig Dispense Refill   cabotegravir & rilpivirine ER (CABENUVA) 600 & 900 MG/3ML injection INJECT 1 KIT INTO THE MUSCLE EVERY 8 (EIGHT) WEEKS. (Patient taking differently: Inject 1 kit into the muscle every 8 (eight) weeks.) 6 mL 6   clonazePAM (KLONOPIN) 0.5 MG tablet Take 2 tablets (1 mg total) by mouth at bedtime. Follow titration instructions provided separately in writing. 60 tablet 3   furosemide (LASIX) 20 MG tablet Take 1 tablet by mouth once daily 90 tablet 0   HUMIRA PEN 80 MG/0.8ML PNKT Inject 80 mg into the skin  once a week.     losartan (COZAAR) 50 MG tablet Take 50 mg by mouth daily.     metroNIDAZOLE (FLAGYL) 250 MG tablet Take 250 mg by mouth 3 (three) times daily.     rivaroxaban (XARELTO) 20 MG TABS tablet Take 1 tablet (20 mg total) by mouth daily with supper. 90 tablet 1   No facility-administered medications prior to visit.     PAST MEDICAL HISTORY: Past Medical History:  Diagnosis Date   Abscess    Between legs   Arthritis    all over   Asthma    Avascular necrosis of femoral head, left (HCC)    Blood transfusion without reported diagnosis    Cholelithiasis    CKD (chronic kidney disease) stage 3, GFR 30-59 ml/min (HCC)    sees kidney Dr.   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[place])    left DVT   Diverticulosis    DVT (deep venous thrombosis) (HCC)    legs   Dyspnea    when walking   Dysrhythmia    remembers mother taking about having an irregular rhythm years when he was a child    GERD (gastroesophageal reflux disease)    HIV (human immunodeficiency virus infection) ([place])    Hypertension    Morbid obesity ([place])      PAST SURGICAL HISTORY: Past Surgical History:  Procedure Laterality Date   COLONOSCOPY  2019   DENTAL SURGERY     had teeth pulled   HYDRADENITIS EXCISION Left 10/14/2016   Procedure: WIDE EXCISION HIDRADENITIS LEFT AXILLA;  Surgeon: Coralie Keens, MD;  Location: [place];  Service: General;  Laterality: Left;   JOINT REPLACEMENT     Left hip Dr. Ninfa Linden 11/29/16   TOTAL HIP ARTHROPLASTY Left 11/29/2016   Procedure: LEFT TOTAL HIP ARTHROPLASTY ANTERIOR APPROACH;  Surgeon: [name], MD;  Location: [facility];  Service: Orthopedics;  Laterality: Left;     FAMILY HISTORY: Family History  Problem Relation Age of Onset   Heart attack Mother 74   Heart attack Father 43   Prostate cancer Brother    Bone cancer Maternal Aunt    Breast cancer Maternal Aunt    Colon cancer Neg Hx    Esophageal cancer Neg Hx    Stomach cancer Neg Hx    Rectal cancer Neg Hx       SOCIAL HISTORY: Social History   Socioeconomic History   Marital status: Single    Spouse name: Not on file   Number of children: 2   Years of education: Not on file   Highest education level: Not on file  Occupational History   Occupation: Disability  Tobacco Use   Smoking status: Never   Smokeless tobacco: Never  Vaping Use   Vaping Use: Never used  Substance and Sexual Activity   Alcohol use: No    Comment: prior  Drug use: No    Comment: prior cocaine use, last 2005   Sexual activity: Not Currently    Partners: Female    Birth control/protection: Condom    Comment: declined condoms 05/2020  Other Topics Concern   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  Generalized: Well developed, in no acute distress  Cardiology: normal rate and rhythm, no murmur auscultated  Respiratory: clear to auscultation bilaterally    Neurological examination  Mentation: Alert oriented to time, place, history taking. Follows all commands speech and language fluent Cranial nerve II-XII: Pupils were equal round reactive to light. Extraocular movements were full, visual field were full on confrontational test. Facial sensation and strength were normal. Head turning and shoulder shrug  were normal and symmetric. Motor: The motor testing reveals 5 over 5 strength of all 4 extremities. Good symmetric motor tone is noted throughout.   Gait and station:  Gait is normal.    DIAGNOSTIC DATA (LABS, IMAGING, TESTING) - I reviewed patient records, labs, notes, testing and imaging myself where available.  Lab Results  Component Value Date   WBC 7.3 01/06/2020   HGB 15.5 01/06/2020   HCT 44.4 01/06/2020   MCV 102 (H) 01/06/2020   PLT 227 01/06/2020      Component Value Date/Time   NA 141 09/28/2020 1023   NA 140 12/30/2016 1228   K 4.3 09/28/2020 1023   K 3.9 12/30/2016 1228   CL 101 09/28/2020 1023   CO2 21 09/28/2020 1023   CO2 24 12/30/2016 1228   GLUCOSE 101 (H) 09/28/2020 1023   GLUCOSE 117 (H) 03/03/2019 0842   GLUCOSE 105 12/30/2016 1228   BUN 17 09/28/2020 1023   BUN 13.9 12/30/2016 1228   CREATININE 1.31 (H) 09/28/2020 1023   CREATININE 1.48 (H) 03/03/2019 0842   CREATININE 1.6 (H) 12/30/2016 1228   CALCIUM 9.6 09/28/2020 1023   CALCIUM 10.1 12/30/2016 1228   PROT 7.6 03/03/2019 0842   PROT 7.5 06/16/2018 0910   PROT 9.2 (H) 12/30/2016 1228   ALBUMIN 4.1 06/16/2018 0910   ALBUMIN 3.5 12/30/2016 1228   AST 15 03/03/2019 0842   AST 11 12/30/2016 1228   ALT 12 03/03/2019 0842   ALT <6 12/30/2016 1228   ALKPHOS 83 06/16/2018 0910   ALKPHOS 107 12/30/2016 1228   BILITOT 0.3 03/03/2019 0842   BILITOT 0.4 06/16/2018 0910   BILITOT 0.33 12/30/2016 1228   GFRNONAA 52 (L) 01/06/2020 1034   GFRNONAA 42 (L) 05/01/2017 1024   GFRAA 61 01/06/2020 1034   GFRAA 49 (L) 05/01/2017 1024   Lab Results  Component Value Date   CHOL 162 08/18/2018   HDL 60 08/18/2018   LDLCALC 81 08/18/2018   TRIG 114 08/18/2018   CHOLHDL 2.7 08/18/2018   Lab Results  Component Value Date   HGBA1C 5.3 09/22/2019  Lab Results  Component Value Date   FRTMYTRZ73 567 12/30/2016   Lab Results  Component Value Date   TSH 1.200 01/06/2020    No flowsheet data found.   No flowsheet data found.   ASSESSMENT AND PLAN  60 y.o. year old male  has a past medical history of Abscess, Arthritis, Asthma, Avascular necrosis of femoral head,  left ([place]), Blood transfusion without reported diagnosis, Cholelithiasis, CKD (chronic kidney disease) stage 3, GFR 30-59 ml/min (HCC), Clotting disorder ([place]), Diverticulosis, DVT (deep venous thrombosis) ([place]), Dyspnea, Dysrhythmia, GERD (gastroesophageal reflux disease), HIV (human immunodeficiency virus infection) ([place]), Hypertension, and Morbid obesity ([place]). here with    RBD (REM behavioral disorder)  Facial paresthesia  Allen Espinoza is doing well. We will continue clonazepam 0.77m daily at bedtime. He will call pharmacy to request refills when due. Last refilled 02/13/2021 for 60 tablets. He will continue to monitor paresthesias. Healthy lifestyle habits encouraged. Sleep hygiene reviewed. He will follow up with me in 6 months.    No orders of the defined types were placed in this encounter.    No orders of the defined types were placed in this encounter.     ADebbora Presto MSN, FNP-C 02/21/2021, 9:41 AM  [facility] [address] [place] [phone]

## 2021-02-20 NOTE — Unmapped (Signed)
[facility] Refill Coordination Note    Specialty Medication(s) to be Shipped:   Inflammatory Disorders: Humira    Other medication(s) to be shipped: No additional medications requested for fill at this time     Dustin Prince, DOB: June 20, 1960  Phone: [phone] (home)       All above HIPAA information was verified with patient.     Was a Nurse, learning disability used for this call? No    Completed refill call assessment today to schedule patient's medication shipment from the [facility] [phone]).  All relevant notes have been reviewed.     Specialty medication(s) and dose(s) confirmed: Regimen is correct and unchanged.   Changes to medications: Dustin Prince reports no changes at this time.  Changes to insurance: No  New side effects reported not previously addressed with a pharmacist or physician: None reported  Questions for the pharmacist: No    Confirmed patient received a Conservation officer, historic buildings and a Surveyor, mining with first shipment. The patient will receive a drug information handout for each medication shipped and additional FDA Medication Guides as required.       DISEASE/MEDICATION-SPECIFIC INFORMATION        For patients on injectable medications: Patient currently has 2 doses left.  Next injection is scheduled for 02/20/2021.    SPECIALTY MEDICATION ADHERENCE     Medication Adherence    Patient reported X missed doses in the last month: 0  Specialty Medication: Humira CF 80 mg/0.8 ml  Patient is on additional specialty medications: No  Any gaps in refill history greater than 2 weeks in the last 3 months: no  Demonstrates understanding of importance of adherence: yes  Informant: patient  Reliability of informant: reliable  Support network for adherence: family member  Confirmed plan for next specialty medication refill: delivery by pharmacy  Refills needed for supportive medications: not needed              Were doses missed due to medication being on hold? No    Humira CF 80 mg/0.8 mg/ml: 14 days of medicine on hand       REFERRAL TO PHARMACIST     Referral to the pharmacist: Not needed      [facility]     Shipping address confirmed in Epic.     Delivery Scheduled: Yes, Expected medication delivery date: 03/01/2021.     Medication will be delivered via UPS to the prescription address in Epic WAM.    Florence Antonelli D Amarya Kuehl   [facility] Specialty Technician

## 2021-02-21 ENCOUNTER — Encounter: Payer: Self-pay | Admitting: Family Medicine

## 2021-02-21 ENCOUNTER — Ambulatory Visit: Payer: Medicaid Other | Admitting: Family Medicine

## 2021-02-21 VITALS — BP 131/79 | HR 56 | Ht 72.0 in | Wt 334.5 lb

## 2021-02-21 DIAGNOSIS — G4752 REM sleep behavior disorder: Secondary | ICD-10-CM | POA: Diagnosis not present

## 2021-02-21 DIAGNOSIS — R202 Paresthesia of skin: Secondary | ICD-10-CM

## 2021-02-27 ENCOUNTER — Other Ambulatory Visit: Payer: Self-pay | Admitting: *Deleted

## 2021-02-27 MED ORDER — FUROSEMIDE 20 MG PO TABS
20.0000 mg | ORAL_TABLET | Freq: Every day | ORAL | 0 refills | Status: DC
Start: 2021-02-27 — End: 2021-04-09

## 2021-02-27 NOTE — Telephone Encounter (Signed)
Prescription refill request for Furosemide 20 mg tablets .  Next office visit  03/08/2019 with Dr. Court Joy.

## 2021-02-28 MED FILL — HUMIRA(CF) PEN 80 MG/0.8 ML SUBCUTANEOUS KIT: 28 days supply | Qty: 4 | Fill #8

## 2021-03-07 ENCOUNTER — Encounter: Payer: Medicaid Other | Admitting: Internal Medicine

## 2021-03-09 ENCOUNTER — Encounter: Payer: Self-pay | Admitting: Internal Medicine

## 2021-03-09 ENCOUNTER — Ambulatory Visit: Payer: Medicaid Other | Admitting: Internal Medicine

## 2021-03-09 VITALS — BP 142/62 | HR 45 | Temp 98.4°F | Wt 340.3 lb

## 2021-03-09 DIAGNOSIS — D179 Benign lipomatous neoplasm, unspecified: Secondary | ICD-10-CM

## 2021-03-09 DIAGNOSIS — Z6841 Body Mass Index (BMI) 40.0 and over, adult: Secondary | ICD-10-CM | POA: Diagnosis not present

## 2021-03-09 DIAGNOSIS — E66813 Obesity, class 3: Secondary | ICD-10-CM

## 2021-03-09 DIAGNOSIS — I10 Essential (primary) hypertension: Secondary | ICD-10-CM | POA: Diagnosis not present

## 2021-03-09 LAB — POCT GLYCOSYLATED HEMOGLOBIN (HGB A1C): Hemoglobin A1C: 5.3 % (ref 4.0–5.6)

## 2021-03-09 LAB — GLUCOSE, CAPILLARY: Glucose-Capillary: 102 mg/dL — ABNORMAL HIGH (ref 70–99)

## 2021-03-09 MED ORDER — OLMESARTAN MEDOXOMIL 20 MG PO TABS: 20.0000 mg | ORAL_TABLET | Freq: Every day | ORAL | 1 refills | Status: DC

## 2021-03-09 MED ORDER — OZEMPIC (0.25 OR 0.5 MG/DOSE) 2 MG/1.5ML ~~LOC~~ SOPN: 0.5000 mg | PEN_INJECTOR | SUBCUTANEOUS | 1 refills | Status: DC

## 2021-03-09 NOTE — Progress Notes (Signed)
   CC: essential hypertension, severe obesity   HPI:Allen Espinoza is a 60 y.o. male who presents for evaluation of essential hypertension and severe obesity. Please see individual problem based A/P for details.   Past Medical History:  Diagnosis Date   Abscess    Between legs   Arthritis    all over   Asthma    Avascular necrosis of femoral head, left (HCC)    Blood transfusion without reported diagnosis    Cholelithiasis    CKD (chronic kidney disease) stage 3, GFR 30-59 ml/min (HCC)    sees kidney Dr.   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  Neurological:  Negative for dizziness and headaches.    Physical Exam: Vitals:   03/09/21 0924  BP: (!) 146/60  Pulse: (!) 48  Temp: 98.4 F (36.9 C)  TempSrc: Oral  SpO2: 99%  Weight: (!) 340 lb 4.8 oz (154.4 kg)   General: severe obese man, ambulates on his own with difficulty Cardiovascular: Normal rate, regular rhythm.  No murmurs Pulmonary : Equal breath sounds, No wheezes, rales, or rhonchi Ext: No edema in lower extremities, no tenderness to palpation of lower extremities. Well circumscribed non tender mass in left arm   Assessment & Plan:   See Encounters Tab for problem based charting.  Patient discussed with Dr.  Jimmye Norman

## 2021-03-09 NOTE — Patient Instructions (Signed)
Thank you for trusting me with your care. To recap, today we discussed the following:   1. Essential hypertension - BMP8+Anion Gap - olmesartan (BENICAR) 20 MG tablet; Take 1 tablet (20 mg total) by mouth daily.  Dispense: 30 tablet; Refill: 1  2. Class 3 severe obesity  - POC Hbg A1C - Semaglutide,0.25 or 0.5MG /DOS, (OZEMPIC, 0.25 OR 0.5 MG/DOSE,) 2 MG/1.5ML SOPN; Inject 0.5 mg into the skin once a week.  Dispense: 5.14 mL; Refill: 1 - Lipid Profile - Amb Ref to Medical Weight Management  I will call with the result of your lab work next week.

## 2021-03-10 ENCOUNTER — Encounter: Payer: Self-pay | Admitting: Internal Medicine

## 2021-03-10 DIAGNOSIS — N644 Mastodynia: Secondary | ICD-10-CM | POA: Insufficient documentation

## 2021-03-10 DIAGNOSIS — D179 Benign lipomatous neoplasm, unspecified: Secondary | ICD-10-CM | POA: Insufficient documentation

## 2021-03-10 LAB — LIPID PANEL
Chol/HDL Ratio: 2.3 ratio (ref 0.0–5.0)
Cholesterol, Total: 150 mg/dL (ref 100–199)
HDL: 65 mg/dL (ref >39)
LDL Chol Calc (NIH): 71 mg/dL (ref 0–99)
Triglycerides: 73 mg/dL (ref 0–149)
VLDL Cholesterol Cal: 14 mg/dL (ref 5–40)

## 2021-03-10 LAB — BMP8+ANION GAP
Anion Gap: 13 mmol/L (ref 10.0–18.0)
BUN/Creatinine Ratio: 8 — ABNORMAL LOW (ref 10–24)
BUN: 10 mg/dL (ref 8–27)
CO2: 24 mmol/L (ref 20–29)
Calcium: 9.1 mg/dL (ref 8.6–10.2)
Chloride: 104 mmol/L (ref 96–106)
Creatinine, Ser: 1.23 mg/dL (ref 0.76–1.27)
Glucose: 98 mg/dL (ref 70–99)
Potassium: 4.3 mmol/L (ref 3.5–5.2)
Sodium: 141 mmol/L (ref 134–144)
eGFR: 66 mL/min/{1.73_m2} (ref >59)

## 2021-03-10 NOTE — Assessment & Plan Note (Signed)
IRS:WNIOEVO'J BP today is BP: (!) 142/62 with a goal of <140/80. The patient endorses adherence to his medication regimen.   Assessment/Plan: Chronic problem, uncontrolled. Stopping Losartan and starting Olmesartan at increased dose.  - stop Losartan  - BMP8+Anion Gap - olmesartan (BENICAR) 20 MG tablet; Take 1 tablet (20 mg total) by mouth daily.  Dispense: 30 tablet; Refill: 1

## 2021-03-10 NOTE — Assessment & Plan Note (Signed)
HPI: Patient is motivated for weight loss. He was a former Printmaker. Many years ago he had hip replacement and after that he put on weight. He is working on diet, and interested in services our clinic can provide. He is living with hidradenitis suppurativa, associated with obesity but no proven causality.   Class 3 severe obesity due to excess calories with body mass index (BMI) of 40.0 to 44.9 in adult): Referral to RD and starting semaglutide. Will need titration in one month to treatment dosing.  - POC Hbg A1C - Semaglutide,0.25 or 0.5MG /DOS, (OZEMPIC, 0.25 OR 0.5 MG/DOSE,) 2 MG/1.5ML SOPN; Inject 0.5 mg into the skin once a week.  Dispense: 5.14 mL; Refill: 1 - Lipid Profile - Amb Ref to Medical Weight Management

## 2021-03-10 NOTE — Assessment & Plan Note (Signed)
Patient living with multiple lipomas , one on abdomen and one on left arm. No further workup at this time and patient aware these are benign. If any change in these areas we can further evaluate with imaging.

## 2021-03-13 ENCOUNTER — Telehealth: Payer: Self-pay

## 2021-03-13 NOTE — Telephone Encounter (Signed)
PA  for pt ( OZEMPIC 2MG /1.5ML PEN INJECTOR )  came through on cover my meds was done and submitted with office notes from 12/9 .Marland Kitchen awaiting approval or denial

## 2021-03-14 NOTE — Telephone Encounter (Signed)
DECISION :   Message from Plan  PA Case: [id number],  Status: Denied.  Notification: Completed.   ( MEDICAID / AND OTHER INSURANCES ARE NO LONGER PAYING  FOR  DIABETIC  MEDS FOR WEIGHT LOST )

## 2021-03-19 NOTE — Telephone Encounter (Signed)
Patient called in stating he was told the "shot medication" would be delivered to his home and he hasn't received it. PA for Ozempic was denied. Will route to Pharm Team to see if they were working with patient on obtaining this a different way.

## 2021-03-19 NOTE — Progress Notes (Signed)
Internal Medicine Clinic Attending  Case discussed with Dr. Steen  At the time of the visit.  We reviewed the resident's history and exam and pertinent patient test results.  I agree with the assessment, diagnosis, and plan of care documented in the resident's note.  

## 2021-03-19 NOTE — Unmapped (Signed)
[facility] Refill Coordination Note    Specialty Medication(s) to be Shipped:   Inflammatory Disorders: Humira    Other medication(s) to be shipped: No additional medications requested for fill at this time     Dustin Prince, DOB: 1961-01-31  Phone: [phone] (home)       All above HIPAA information was verified with patient.     Was a Nurse, learning disability used for this call? No    Completed refill call assessment today to schedule patient's medication shipment from the [facility] [phone]).  All relevant notes have been reviewed.     Specialty medication(s) and dose(s) confirmed: Regimen is correct and unchanged.   Changes to medications: Dustin Prince reports no changes at this time.  Changes to insurance: No  New side effects reported not previously addressed with a pharmacist or physician: None reported  Questions for the pharmacist: No    Confirmed patient received a Conservation officer, historic buildings and a Surveyor, mining with first shipment. The patient will receive a drug information handout for each medication shipped and additional FDA Medication Guides as required.       DISEASE/MEDICATION-SPECIFIC INFORMATION        For patients on injectable medications: Patient currently has 2 doses left.  Next injection is scheduled for 03/20/2021.    SPECIALTY MEDICATION ADHERENCE     Medication Adherence    Patient reported X missed doses in the last month: 0  Specialty Medication: Humira CF 80 mg/0.8 ml  Patient is on additional specialty medications: No  Any gaps in refill history greater than 2 weeks in the last 3 months: no  Demonstrates understanding of importance of adherence: yes  Informant: patient  Reliability of informant: reliable  Support network for adherence: family member  Confirmed plan for next specialty medication refill: delivery by pharmacy  Refills needed for supportive medications: not needed              Were doses missed due to medication being on hold? No    Humira CF 80 mg/0.8 mg/ml: 14 days of medicine on hand       REFERRAL TO PHARMACIST     Referral to the pharmacist: Not needed      [facility]     Shipping address confirmed in Epic.     Delivery Scheduled: Yes, Expected medication delivery date: 03/22/2021.     Medication will be delivered via UPS to the prescription address in Epic WAM.    Dustin Prince Dustin Prince   [facility] Specialty Technician

## 2021-03-21 MED FILL — HUMIRA(CF) PEN 80 MG/0.8 ML SUBCUTANEOUS KIT: 28 days supply | Qty: 4 | Fill #9

## 2021-03-30 ENCOUNTER — Other Ambulatory Visit (HOSPITAL_COMMUNITY): Payer: Self-pay

## 2021-03-30 MED FILL — Cabotegravir 600 MG/3ML & Rilpivirine 900 MG/3ML IM Susp ER: INTRAMUSCULAR | 34 days supply | Qty: 6 | Fill #4 | Status: AC

## 2021-04-02 ENCOUNTER — Other Ambulatory Visit (HOSPITAL_COMMUNITY): Payer: Self-pay

## 2021-04-02 DIAGNOSIS — L732 Hidradenitis suppurativa: Principal | ICD-10-CM

## 2021-04-02 MED ORDER — METRONIDAZOLE 250 MG TABLET
ORAL_TABLET | 0 refills | 0.00000 days
Start: 2021-04-02 — End: ?

## 2021-04-03 ENCOUNTER — Telehealth: Payer: Self-pay

## 2021-04-03 MED ORDER — METRONIDAZOLE 250 MG TABLET
ORAL_TABLET | 0 refills | 0.00000 days | Status: CP
Start: 2021-04-03 — End: ?

## 2021-04-03 NOTE — Telephone Encounter (Signed)
[facility] Patient Advocate Encounter  Patient's medication [name] have been couriered to [facility] from [facility] and will be administered on the patient next office visit on 04/12/21.  [name] , [facility] Patient [facility] Phone: [phone] Fax:  [phone]

## 2021-04-03 NOTE — Unmapped (Signed)
Refill request for metronidazole.    LOV: 01/31/2020    Patient has upcoming appointment 04/23/2021.    Bridge refill pended if appropriate.

## 2021-04-09 ENCOUNTER — Encounter: Payer: Self-pay | Admitting: Internal Medicine

## 2021-04-09 ENCOUNTER — Other Ambulatory Visit: Payer: Self-pay

## 2021-04-09 ENCOUNTER — Ambulatory Visit: Payer: Medicaid Other | Admitting: Internal Medicine

## 2021-04-09 VITALS — BP 141/68 | HR 46 | Temp 97.7°F | Resp 24 | Ht 72.0 in | Wt 340.3 lb

## 2021-04-09 DIAGNOSIS — Z6841 Body Mass Index (BMI) 40.0 and over, adult: Secondary | ICD-10-CM

## 2021-04-09 DIAGNOSIS — I10 Essential (primary) hypertension: Secondary | ICD-10-CM

## 2021-04-09 MED ORDER — FUROSEMIDE 20 MG PO TABS
20.0000 mg | ORAL_TABLET | Freq: Every day | ORAL | 0 refills | Status: DC
Start: 2021-04-09 — End: 2021-11-13

## 2021-04-09 NOTE — Progress Notes (Signed)
° °  CC: 1 month follow up  HPI:[name] is a 61 y.o. male who presents for evaluation of 1 month follow up. Please see individual problem based A/P for details.   Depression, PHQ-9: Based on the patients  [place] Visit from 09/22/2019 in [place]  PHQ-9 Total Score 9      score we have 9.  Past Medical History:  Diagnosis Date   Abscess    Between legs   Arthritis    all over   Asthma    Avascular necrosis of femoral head, left (HCC)    Blood transfusion without reported diagnosis    Cholelithiasis    CKD (chronic kidney disease) stage 3, GFR 30-59 ml/min (HCC)    sees kidney Dr.   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   HENT: Negative.    Eyes: Negative.   Respiratory: Negative.    Cardiovascular: Negative.   Gastrointestinal: Negative.   Genitourinary: Negative.   Musculoskeletal: Negative.   Skin: Negative.   Neurological: Negative.   Endo/Heme/Allergies: Negative.   Psychiatric/Behavioral: Negative.      Physical Exam: There were no vitals filed for this visit.   General: alert and oriented, obese male HEENT: Conjunctiva nl , antiicteric sclerae, moist mucous membranes, no exudate or erythema Cardiovascular: Normal rate, regular rhythm.  No murmurs, rubs, or gallops Pulmonary : Equal breath sounds, No wheezes, rales, or rhonchi Abdominal: soft, nontender,  bowel sounds present Ext: No edema in lower extremities, no tenderness to palpation of lower extremities.   Assessment & Plan:   See Encounters Tab for problem  based charting.  Patient discussed with Dr. Jimmye Norman

## 2021-04-09 NOTE — Patient Instructions (Addendum)
Dear Mr. Vandermeulen,  Thank you for trusting [place] with your care today.  I have sent in a refill for the lasix 20mg  to take daily. Please continue taking the Olmesartan as previously prescribed as well.   For the weight loss, I have sent in a referral to our registered dietician and made them aware that you are limited in exercise by pain and the blood clots. We will work to find an alternative to the [employer] for you in the mean time.  Please follow up in 3 months for blood pressure check.

## 2021-04-12 ENCOUNTER — Ambulatory Visit (INDEPENDENT_AMBULATORY_CARE_PROVIDER_SITE_OTHER): Payer: Medicaid Other | Admitting: Pharmacist

## 2021-04-12 ENCOUNTER — Other Ambulatory Visit: Payer: Self-pay

## 2021-04-12 DIAGNOSIS — B20 Human immunodeficiency virus [HIV] disease: Secondary | ICD-10-CM | POA: Diagnosis not present

## 2021-04-12 MED ORDER — CABOTEGRAVIR & RILPIVIRINE ER 600 & 900 MG/3ML IM SUER
1.0000 | Freq: Once | INTRAMUSCULAR | Status: AC
  Administered 2021-04-12: 1 via INTRAMUSCULAR

## 2021-04-12 NOTE — Progress Notes (Signed)
HPI: Allen Espinoza is a 61 y.o. male who presents to the [facility] for [place] administration.  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 ([place]) 08/19/2016   Class 3 severe obesity due to excess calories with body mass index (BMI) of 45.0 to 49.9 in adult ([place]) 08/19/2016   Solitary pulmonary nodule 05/06/2016   Hidradenitis suppurativa 04/27/2016   Chest pain 04/26/2016   Avascular necrosis of bone of hip, left s/p TRH ([place]) 03/19/2016   Human immunodeficiency virus (HIV) disease ([place]) 03/18/2016   Chronic kidney disease 03/18/2016   Cholelithiasis 03/18/2016   Diverticulosis of colon without hemorrhage 03/18/2016    Patient's Medications  New Prescriptions   No medications on file  Previous Medications   CABOTEGRAVIR & RILPIVIRINE ER (CABENUVA) 600 & 900 MG/3ML INJECTION    INJECT 1 KIT INTO THE MUSCLE EVERY 8 (EIGHT) WEEKS.   CLONAZEPAM (KLONOPIN) 0.5 MG TABLET    Take 2 tablets (1 mg total) by mouth at bedtime. Follow titration instructions provided separately in writing.   FUROSEMIDE (LASIX) 20 MG TABLET    Take 1 tablet (20 mg total) by mouth daily.   HUMIRA PEN 80 MG/0.8ML PNKT    Inject 80 mg into the skin once a week.   METRONIDAZOLE (FLAGYL) 250 MG TABLET    Take 250 mg by mouth 3 (three) times daily.   OLMESARTAN (BENICAR) 20 MG TABLET    Take 1 tablet (20 mg total) by mouth daily.   RIVAROXABAN (XARELTO) 20 MG TABS TABLET    Take 1 tablet (20 mg total) by mouth daily with supper.   SEMAGLUTIDE,0.25 OR 0.5MG/DOS, (OZEMPIC, 0.25 OR 0.5 MG/DOSE,) 2 MG/1.5ML SOPN    Inject 0.5 mg into the skin once a week.  Modified Medications   No medications on file  Discontinued Medications   No medications on  file    Allergies: Allergies  Allergen Reactions   Sulfa Antibiotics Other (See Comments)    Low blood pressure   Vancomycin Itching    Give with benadryl    Past Medical History: Past Medical History:  Diagnosis Date   Abscess    Between legs   Arthritis    all over   Asthma    Avascular necrosis of femoral head, left (HCC)    Blood transfusion without reported diagnosis    Cholelithiasis    CKD (chronic kidney disease) stage 3, GFR 30-59 ml/min (HCC)    sees kidney Dr.   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[place])    left DVT   Diverticulosis    DVT (deep venous thrombosis) (HCC)    legs   Dyspnea    when walking   Dysrhythmia    remembers mother taking about having an irregular rhythm years when he was a child    GERD (gastroesophageal reflux disease)    HIV (human immunodeficiency virus infection) ([place])    Hypertension    Morbid obesity ([place])     Social History: Social History   Socioeconomic History   Marital status: Single    Spouse name: Not on file   Number of children: 2   Years of education: Not on file   Highest education level: Not on file  Occupational History   Occupation: Disability  Tobacco Use   Smoking status: Never   Smokeless tobacco: Never  Vaping Use   Vaping Use: Never used  Substance and Sexual Activity   Alcohol use: No    Comment: prior   Drug use: No    Comment: prior cocaine use, last 2005   Sexual activity: Not Currently    Partners: Female    Birth control/protection: Condom    Comment: declined condoms 05/2020  Other Topics Concern   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, mailroom, warehouse work   Lives w/ fiancee   No EtOH, tobacco, drugs   Social Determinants of Health   Financial Resource Strain: Not on file  Food Insecurity: Not on file  Transportation Needs: Not on file  Physical Activity: Not on file  Stress: Not on file  Social Connections: Not on file    Labs: Lab Results   Component Value Date   HIV1RNAQUANT Not Detected 02/13/2021   HIV1RNAQUANT Not Detected 12/14/2020   HIV1RNAQUANT Not Detected 10/13/2020   HIV1RNAVL 47,300 03/18/2016   CD4TABS 440 08/17/2020   CD4TABS 345 (L) 02/01/2020   CD4TABS 401 03/03/2019    RPR and STI Lab Results  Component Value Date   LABRPR NON-REACTIVE 02/01/2020   LABRPR NON-REACTIVE 08/18/2018   LABRPR NON-REACTIVE 05/01/2017   LABRPR NON REAC 08/07/2016    STI Results GC CT  02/13/2021 Negative Negative  02/01/2020 Negative Negative  03/03/2019 Negative Negative  05/01/2017 Negative Negative  08/07/2016 Negative Negative  04/26/2016 Negative Negative    Hepatitis B Lab Results  Component Value Date   HEPBSAB REACTIVE (A) 11/19/2016   HEPBSAG NON-REACTIVE 11/19/2016   Hepatitis C No results found for: HEPCAB, HCVRNAPCRQN Hepatitis A No results found for: HAV Lipids: Lab Results  Component Value Date   CHOL 150 03/09/2021   TRIG 73 03/09/2021   HDL 65 03/09/2021   CHOLHDL 2.3 03/09/2021   VLDL 16 08/07/2016   LDLCALC 71 03/09/2021    TARGET DATE:  The 20th of the month  Current HIV Regimen: Cabenuva IM every 2 months  Assessment: Allen Espinoza presents today for their maintenance Cabenuva injections. Initial/past injections were tolerated well without issues. No problems with systemic effects of injections. Patient did experience mild soreness which subsided after a couple of days.   Administered cabotegravir 686m/3mL in left upper outer quadrant of the gluteal muscle. Administered rilpivirine 900 mg/352min the right upper outer quadrant of the gluteal muscle. Monitored patient for 10 minutes after injection. Injections were tolerated well without issue. Patient will follow up in 2 months for next injection.  Since patient has had undetectable HIV RNA for several months and has not missed any injections, we will not get any labs today. Patient declined STI screening.   Plan: - Cabenuva  injections administered - Next injections scheduled for 06/21/21 with GrMarya Amsler5/23/23 with AmEstill Bamberg Call with any issues or questions  AmAlfonse SprucePharmD, CPP Clinical Pharmacist Practitioner [place]

## 2021-04-12 NOTE — Unmapped (Signed)
[facility] Refill Coordination Note    Specialty Medication(s) to be Shipped:   Inflammatory Disorders: Humira    Other medication(s) to be shipped: No additional medications requested for fill at this time     [name], DOB: 10-31-1960  Phone: [phone] (home)       All above HIPAA information was verified with patient.     Was a Nurse, learning disability used for this call? No    Completed refill call assessment today to schedule patient's medication shipment from the [facility] [phone]).  All relevant notes have been reviewed.     Specialty medication(s) and dose(s) confirmed: Regimen is correct and unchanged.   Changes to medications: Shaheen reports no changes at this time.  Changes to insurance: No  New side effects reported not previously addressed with a pharmacist or physician: None reported  Questions for the pharmacist: No    Confirmed patient received a Conservation officer, historic buildings and a Surveyor, mining with first shipment. The patient will receive a drug information handout for each medication shipped and additional FDA Medication Guides as required.       DISEASE/MEDICATION-SPECIFIC INFORMATION        For patients on injectable medications: Patient currently has 2 doses left.  Next injection is scheduled for 04/17/21.    SPECIALTY MEDICATION ADHERENCE     Medication Adherence    Patient reported X missed doses in the last month: 0  Specialty Medication: HUMIRA(  Patient is on additional specialty medications: No  Support network for adherence: family member              Were doses missed due to medication being on hold? No    Humira 80/0.8 mg/ml: 12 days of medicine on hand        REFERRAL TO PHARMACIST     Referral to the pharmacist: Not needed      [facility]     Shipping address confirmed in Epic.     Delivery Scheduled: Yes, Expected medication delivery date: 04/24/21.     Medication will be delivered via UPS to the prescription address in Epic WAM.    [facility] Specialty Technician

## 2021-04-16 ENCOUNTER — Encounter: Payer: Self-pay | Admitting: Internal Medicine

## 2021-04-16 NOTE — Assessment & Plan Note (Addendum)
Patient was switched from losartan to olmesartan at last visit. At that time, he also stopped taking his lasix as prescribed. Patient was inconsistently taking lasix previously. Today he reports some heaviness in legs and 4lb weight gain.  Blood pressure uncontrolled today. 761 systolic and 518 on recheck. Advised patient to continue taking lasix and sent in refill for 20mg  daily. Will also continue on olmesartan.

## 2021-04-16 NOTE — Assessment & Plan Note (Signed)
Patient states he is motivated for weight loss. Exercise is limited by pain from hip replacement and blood clots. He still has a poor diet. He was referred to RD previously and prescribed Ozempic as well. He has not been able to fill Ozempic rx as prior auth didn't approve since patient without DM.  - patient will need to work with the dietician to find healthy foods to promote weight loss despite his sedentary lifestyle. Will also need to find alternative to Ozempic for patient.

## 2021-04-23 NOTE — Unmapped (Deleted)
Dermatology Note     Assessment and Plan:      ??  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    - Discussed considering deroofing procedure for thighs and procedure for scrotal area with Reconstructive Urology, which patient deferred to both today  - Discussed increasing Humira dose to 80 mg weekly for better control  - Reviewed role of biologic therapy including humira and remicade   - Based on discussion above and r/b/a reviewed, mutual decision to proceed with the following:  - Continue Humira 40mg  [place] weekly    - Continue metronidazole 250 mg TID. Counseled regarding risk of peripheral neuropathy with long-term use.  - Start triamcinolone (KENALOG) 0.1 % ointment; Apply topically Two (2) times a day. To affected area(s) prn for dermatitis in the gluteal cleft, appropriate use discussed    High Risk Medication Use  -Quant gold negative 01/31/2020  -Ordered repeat ***Quant TB Gold sent to [facility], will be checked with his other bloodwork in the next week    The patient was advised to call for an appointment should any new, changing, or symptomatic lesions develop.     RTC: No follow-ups on file. or sooner as needed   _________________________________________________________________      Chief Complaint     Follow up of HS    HPI     Dustin Prince is a 61 y.o. male who presents as a returning patient (last seen by Dr. Janyth Contes on 01/31/2020) to [facility] for follow up of hidradenitis suppurativa. At last visit, patient was to start triamcinolone 0.1% ointment, continue Humira 40mg  subq weekly, and metronidazole 250 mg TID for.     Today ***    Self-reported severity (0-5): {0-5:36085}  VAS pain today: {numbers 0-10:5044}  VAS average pain for the last month: {numbers 0-10:5044}  Requiring pain medication? {YES/NO:21013}  If so, what type/frequency? ***  How often in pain?  {hspain:53295}  Level of odor (0-5): {0-5 selection:53296}  Level of itching (0-5): {0-5 selection:53296}  Dressing changes needed for drainage:{hs dressings:52675}  How much drainage: {hsdrainage:52676}  Flare in the last month (Y/N)? {YES/NO:21013}  How long ago was the last flare? {last flare:53297}  Developing new lesions? {hsnewlesion:52677}  Number of inflammatory lesions montly: {number of lesions monthly:53298}  DLQI: ***  Current treatment: ***     How helpful is the current treatment in managing the following aspects of your disease?  Not at all helpful Somewhat helpful Very helpful   Pain      Decreasing length of flares      Decreasing new lesions      Drainage      Decreasing frequency of flares      Decreasing severity of flares      Odor            The patient denies any other new or changing lesions or areas of concern.     Pertinent Past Medical History     Social History:  Current or former smoker? never  Amount smoking: n/a  How many years: n/a  ED visits in the last 5 years? 6-10  Difficulty affording medications? most of the time  Marital Status: in relationship  Living with some one? Yes.  ??  Prior treatments:  Topical: topical ABX  Systemic: clidnamycin; doxy (not very helpful),   Past surgical procedures: large WLE left axilla c/b dehisence   Past laser procedures: none  ??  ??  ROS: the balance of 10 systems is negative unless otherwise documented    Physical Examination     ??OBJECTIVE:   Gen: Well-appearing patient, appropriate, interactive, in no acute distress  Skin: Examination of the buttocks, and external genitalia*** performed today and pertinent for:   ***      location Abscess Inflamed nodule Non-inflamed nodule Draining sinus Non-draining Sinus Hurley % scar   R axilla          L axilla          R inframammary          L inframammary Intermammary          Pubic          R inguinal          R thigh          L inguinal          L thigh          Scrotum/Vulva          Perianal          R buttock          L buttock          Other (list)                      AN count (total sum of abscess and inflammatory nodule):  Pilonidal sinus? ***    ***    -All other areas not specifically commented on are within normal limits    Scribe's Attestation: [name], MD obtained and performed the history, physical exam and medical decision making elements that were entered into the chart.  Signed by Laurina Bustle, Scribe, on ***.    {*** NOTE TO PROVIDER: PLEASE ADD ATTESTATION NOTING YOU AGREE WITH SCRIBE DOCUMENTATION}       (Approved Template 12/13/2019)

## 2021-04-24 MED FILL — HUMIRA(CF) PEN 80 MG/0.8 ML SUBCUTANEOUS KIT: 28 days supply | Qty: 4 | Fill #10

## 2021-04-26 NOTE — Progress Notes (Signed)
Internal Medicine Clinic Attending ° °Case discussed with Dr. Gawaluck  At the time of the visit.  We reviewed the resident’s history and exam and pertinent patient test results.  I agree with the assessment, diagnosis, and plan of care documented in the resident’s note.  °

## 2021-04-26 NOTE — Unmapped (Signed)
This encounter was created in error - please disregard.

## 2021-05-01 DIAGNOSIS — L732 Hidradenitis suppurativa: Secondary | ICD-10-CM | POA: Diagnosis not present

## 2021-05-01 DIAGNOSIS — R0789 Other chest pain: Secondary | ICD-10-CM | POA: Diagnosis not present

## 2021-05-01 DIAGNOSIS — I5189 Other ill-defined heart diseases: Secondary | ICD-10-CM | POA: Diagnosis not present

## 2021-05-01 DIAGNOSIS — I129 Hypertensive chronic kidney disease with stage 1 through stage 4 chronic kidney disease, or unspecified chronic kidney disease: Secondary | ICD-10-CM | POA: Diagnosis not present

## 2021-05-01 DIAGNOSIS — M87052 Idiopathic aseptic necrosis of left femur: Secondary | ICD-10-CM | POA: Diagnosis not present

## 2021-05-01 DIAGNOSIS — N183 Chronic kidney disease, stage 3 unspecified: Secondary | ICD-10-CM | POA: Diagnosis not present

## 2021-05-01 DIAGNOSIS — B20 Human immunodeficiency virus [HIV] disease: Secondary | ICD-10-CM | POA: Diagnosis not present

## 2021-05-01 DIAGNOSIS — I82409 Acute embolism and thrombosis of unspecified deep veins of unspecified lower extremity: Secondary | ICD-10-CM | POA: Diagnosis not present

## 2021-05-07 ENCOUNTER — Other Ambulatory Visit: Payer: Self-pay | Admitting: Internal Medicine

## 2021-05-07 DIAGNOSIS — I10 Essential (primary) hypertension: Secondary | ICD-10-CM

## 2021-05-07 DIAGNOSIS — I825Z2 Chronic embolism and thrombosis of unspecified deep veins of left distal lower extremity: Secondary | ICD-10-CM

## 2021-05-07 DIAGNOSIS — I82409 Acute embolism and thrombosis of unspecified deep veins of unspecified lower extremity: Secondary | ICD-10-CM

## 2021-05-07 DIAGNOSIS — L732 Hidradenitis suppurativa: Secondary | ICD-10-CM

## 2021-05-09 DIAGNOSIS — I480 Paroxysmal atrial fibrillation: Secondary | ICD-10-CM | POA: Diagnosis not present

## 2021-05-10 NOTE — Telephone Encounter (Signed)
Refill Request  Pt states he has been waiting to get his refills since 05/07/2021.   olmesartan (BENICAR) 20 MG tablet rivaroxaban (XARELTO) 20 MG TABS tablet   [place], [place] - [address] (Ph: [phone])

## 2021-05-11 ENCOUNTER — Other Ambulatory Visit: Payer: Self-pay | Admitting: Internal Medicine

## 2021-05-11 MED ORDER — METRONIDAZOLE 250 MG PO TABS: 250.0000 mg | ORAL_TABLET | Freq: Three times a day (TID) | ORAL | 3 refills | Status: DC

## 2021-05-14 DIAGNOSIS — R002 Palpitations: Secondary | ICD-10-CM | POA: Diagnosis not present

## 2021-05-14 DIAGNOSIS — D485 Neoplasm of uncertain behavior of skin: Secondary | ICD-10-CM | POA: Diagnosis not present

## 2021-05-14 DIAGNOSIS — R42 Dizziness and giddiness: Secondary | ICD-10-CM | POA: Diagnosis not present

## 2021-05-14 DIAGNOSIS — Z1211 Encounter for screening for malignant neoplasm of colon: Secondary | ICD-10-CM | POA: Diagnosis not present

## 2021-05-14 DIAGNOSIS — I70219 Atherosclerosis of native arteries of extremities with intermittent claudication, unspecified extremity: Secondary | ICD-10-CM | POA: Diagnosis not present

## 2021-05-14 DIAGNOSIS — I480 Paroxysmal atrial fibrillation: Secondary | ICD-10-CM | POA: Diagnosis not present

## 2021-05-14 DIAGNOSIS — R1032 Left lower quadrant pain: Secondary | ICD-10-CM | POA: Diagnosis not present

## 2021-05-14 DIAGNOSIS — Z0000 Encounter for general adult medical examination without abnormal findings: Secondary | ICD-10-CM | POA: Diagnosis not present

## 2021-05-14 DIAGNOSIS — H9113 Presbycusis, bilateral: Secondary | ICD-10-CM | POA: Diagnosis not present

## 2021-05-23 DIAGNOSIS — N183 Chronic kidney disease, stage 3 unspecified: Secondary | ICD-10-CM | POA: Diagnosis not present

## 2021-05-23 DIAGNOSIS — N189 Chronic kidney disease, unspecified: Secondary | ICD-10-CM | POA: Diagnosis not present

## 2021-05-23 NOTE — Unmapped (Signed)
[facility] Refill Coordination Note    Specialty Medication(s) to be Shipped:   Inflammatory Disorders: Humira    Other medication(s) to be shipped: No additional medications requested for fill at this time     Dustin Prince, DOB: [birth year]  Phone: [phone] (home)       All above HIPAA information was verified with patient.     Was a Nurse, learning disability used for this call? No    Completed refill call assessment today to schedule patient's medication shipment from the [facility] [phone]).  All relevant notes have been reviewed.     Specialty medication(s) and dose(s) confirmed: Regimen is correct and unchanged.   Changes to medications: Dustin Prince reports no changes at this time.  Changes to insurance: No  New side effects reported not previously addressed with a pharmacist or physician: None reported  Questions for the pharmacist: No    Confirmed patient received a Conservation officer, historic buildings and a Surveyor, mining with first shipment. The patient will receive a drug information handout for each medication shipped and additional FDA Medication Guides as required.       DISEASE/MEDICATION-SPECIFIC INFORMATION        For patients on injectable medications: Patient currently has 0 doses left.  Next injection is scheduled for 05/29/2021.    SPECIALTY MEDICATION ADHERENCE     Medication Adherence    Patient reported X missed doses in the last month: 0  Specialty Medication: Humira  Patient is on additional specialty medications: No  Any gaps in refill history greater than 2 weeks in the last 3 months: no  Demonstrates understanding of importance of adherence: yes  Informant: patient  Reliability of informant: reliable  Support network for adherence: family member  Confirmed plan for next specialty medication refill: delivery by pharmacy  Refills needed for supportive medications: not needed              Were doses missed due to medication being on hold? No    Humira 80/0.8 mg/ml: 0 days of medicine on hand        REFERRAL TO PHARMACIST     Referral to the pharmacist: Not needed      [facility]     Shipping address confirmed in Epic.     Delivery Scheduled: Yes, Expected medication delivery date: 05/25/2021.     Medication will be delivered via UPS to the prescription address in Epic WAM.    Dustin Prince Dustin Prince   [facility] Shared [facility] Specialty Technician

## 2021-05-24 MED FILL — HUMIRA(CF) PEN 80 MG/0.8 ML SUBCUTANEOUS KIT: 28 days supply | Qty: 4 | Fill #11

## 2021-06-11 ENCOUNTER — Other Ambulatory Visit: Payer: Self-pay | Admitting: Pharmacist

## 2021-06-11 ENCOUNTER — Other Ambulatory Visit (HOSPITAL_COMMUNITY): Payer: Self-pay

## 2021-06-11 DIAGNOSIS — B20 Human immunodeficiency virus [HIV] disease: Secondary | ICD-10-CM

## 2021-06-11 MED ORDER — CABOTEGRAVIR & RILPIVIRINE ER 600 & 900 MG/3ML IM SUER
1.0000 | INTRAMUSCULAR | 5 refills | Status: DC
  Filled 2021-06-11: qty 6, 34d supply, fill #0
  Filled 2021-06-11: qty 6, 56d supply, fill #0
  Filled 2021-08-15: qty 6, 34d supply, fill #1
  Filled 2021-10-08 – 2021-10-09 (×2): qty 6, 34d supply, fill #2
  Filled 2021-12-05: qty 6, 34d supply, fill #3
  Filled 2022-01-29: qty 6, 34d supply, fill #4
  Filled 2022-04-16: qty 6, 34d supply, fill #5

## 2021-06-12 ENCOUNTER — Telehealth: Payer: Self-pay

## 2021-06-12 NOTE — Telephone Encounter (Signed)
[facility] Patient Advocate Encounter ? ?Patient's medication (cabenuva) have been couriered to [facility] from [place] and will be administered on the patient next office visit on 06/21/21. ? ?[name] , CPhT ?Specialty Pharmacy Patient Advocate ?[place] [facility] ?Phone: [phone] ?Fax:  [phone]  ?

## 2021-06-13 DIAGNOSIS — L732 Hidradenitis suppurativa: Principal | ICD-10-CM

## 2021-06-13 MED ORDER — HUMIRA(CF) PEN 80 MG/0.8 ML SUBCUTANEOUS KIT
11 refills | 0.00000 days
Start: 2021-06-13 — End: ?

## 2021-06-13 NOTE — Unmapped (Signed)
Hi,  Can you contact patient to schedule a HS appointment with Dr Janyth Contes?    Thanks!

## 2021-06-13 NOTE — Unmapped (Signed)
LOV: 01/31/2020    Patient needs an appointment for further refills

## 2021-06-15 DIAGNOSIS — L732 Hidradenitis suppurativa: Principal | ICD-10-CM

## 2021-06-15 MED ORDER — HUMIRA(CF) PEN 80 MG/0.8 ML SUBCUTANEOUS KIT
SUBCUTANEOUS | 11 refills | 0.00000 days | Status: CP
Start: 2021-06-15 — End: ?
  Filled 2021-06-20: qty 4, 28d supply, fill #0

## 2021-06-18 ENCOUNTER — Other Ambulatory Visit (HOSPITAL_COMMUNITY): Payer: Self-pay

## 2021-06-18 NOTE — Unmapped (Signed)
[facility] Refill Coordination Note    Specialty Medication(s) to be Shipped:   Inflammatory Disorders: Humira    Other medication(s) to be shipped: No additional medications requested for fill at this time     [name], DOB: [birth year]  Phone: [phone] (home)       All above HIPAA information was verified with patient.     Was a Nurse, learning disability used for this call? No    Completed refill call assessment today to schedule patient's medication shipment from the [facility] [phone]).  All relevant notes have been reviewed.     Specialty medication(s) and dose(s) confirmed: Regimen is correct and unchanged.   Changes to medications: Jovann reports no changes at this time.  Changes to insurance: No  New side effects reported not previously addressed with a pharmacist or physician: None reported  Questions for the pharmacist: No    Confirmed patient received a Conservation officer, historic buildings and a Surveyor, mining with first shipment. The patient will receive a drug information handout for each medication shipped and additional FDA Medication Guides as required.       DISEASE/MEDICATION-SPECIFIC INFORMATION        For patients on injectable medications: Patient currently has 1 doses left.  Next injection is scheduled for 06/19/2021.    SPECIALTY MEDICATION ADHERENCE     Medication Adherence    Patient reported X missed doses in the last month: 0  Specialty Medication: Humira  Patient is on additional specialty medications: No  Any gaps in refill history greater than 2 weeks in the last 3 months: no  Demonstrates understanding of importance of adherence: yes  Informant: patient  Reliability of informant: reliable  Support network for adherence: family member  Confirmed plan for next specialty medication refill: delivery by pharmacy  Refills needed for supportive medications: not needed              Were doses missed due to medication being on hold? No    Humira 80/0.8 mg/ml: 7 days of medicine on hand        REFERRAL TO PHARMACIST     Referral to the pharmacist: Not needed      [facility]     Shipping address confirmed in Epic.     Delivery Scheduled: Yes, Expected medication delivery date: 06/21/2021.     Medication will be delivered via UPS to the prescription address in Epic WAM.    Betheny Suchecki D Charelle Petrakis   [facility] Specialty Technician

## 2021-06-21 ENCOUNTER — Encounter: Payer: Self-pay | Admitting: Family

## 2021-06-21 ENCOUNTER — Other Ambulatory Visit: Payer: Self-pay

## 2021-06-21 ENCOUNTER — Ambulatory Visit (INDEPENDENT_AMBULATORY_CARE_PROVIDER_SITE_OTHER): Payer: Medicaid Other | Admitting: Family

## 2021-06-21 VITALS — BP 156/86 | HR 50 | Temp 97.2°F | Wt 331.0 lb

## 2021-06-21 DIAGNOSIS — B20 Human immunodeficiency virus [HIV] disease: Secondary | ICD-10-CM

## 2021-06-21 DIAGNOSIS — Z0000 Encounter for general adult medical examination without abnormal findings: Secondary | ICD-10-CM

## 2021-06-21 DIAGNOSIS — Z113 Encounter for screening for infections with a predominantly sexual mode of transmission: Secondary | ICD-10-CM

## 2021-06-21 MED ORDER — CABOTEGRAVIR & RILPIVIRINE ER 600 & 900 MG/3ML IM SUER
1.0000 | Freq: Once | INTRAMUSCULAR | Status: AC
  Administered 2021-06-21: 1 via INTRAMUSCULAR

## 2021-06-21 NOTE — Assessment & Plan Note (Signed)
?   Discussed importance of safe sexual practice and condom use. Condoms offered.  ?? Routine vaccinations up to date.  ?? Has dentures, follow up with dentistry as needed.  ?

## 2021-06-21 NOTE — Assessment & Plan Note (Signed)
Mr. Doshi continues to have well controlled virus with good adherence and tolerance to [place]. No signs/symptoms of opportunistic infection. We reviewed previous lab work and discussed plan of care. Check lab work today. Injection of Cabenuva provided without complication. Plan for follow up in 2 months or sooner if needed.  ?

## 2021-06-21 NOTE — Patient Instructions (Addendum)
Nice to see you. ? ?We will check your lab work today. ? ?Refills have been sent to the pharmacy. ? ?Plan for follow up in 2 months with [facility] as scheduled.  ? ?Have a great day and stay safe! ? ?

## 2021-06-21 NOTE — Progress Notes (Signed)
? ? ?Brief Narrative  ? ?Patient ID: Allen Espinoza, male    DOB: [birth year], 61 y.o.   MRN: [id number] ? ?Allen Espinoza is a 61 y/o male with HIV disease diagnosed in 2007 with risk factor of heterosexual contact. Initial viral load and CD4 count are unavailable.Genotype with no medication resistant mutations. No history of opportunistic infection. ART history with Genvoya and Prezista (DDI with Xarelto) and Biktarvy.  ? ?Subjective:  ?  ?Chief Complaint  ?Patient presents with  ? Follow-up  ? ? ?HPI: ? ?Allen Espinoza is a 61 y.o. male with HIV disease last seen on 04/12/21 by Alfonse Spruce, PharmD, CPP with good tolerance and follow up injection for Cabenuva. Previous lab work from November 2022 with undetectable viral load. Here today for routine follow up.  ? ?Allen Espinoza continues to receive his Allen Espinoza other month and is tolerating it well with only 1-2 days of soreness following injection. Feeling well today with no new concerns complaints. Denies fevers, chills, night sweats, headaches, changes in vision, neck pain/stiffness, nausea, diarrhea, vomiting, lesions or rashes. ? ?Allen Espinoza has no problems obtaining medications. Denies feelings of being down, depressed or hopeless recently. No recreational/illicit drug use, alcohol consumption or tobacco use. Condoms offered. Has dentures and will see dentist.  ? ?Allergies  ?Allergen Reactions  ? Sulfa Antibiotics Other (See Comments)  ?  Low blood pressure  ? Vancomycin Itching  ?  Give with benadryl  ? ? ? ? ?Outpatient Medications Prior to Visit  ?Medication Sig Dispense Refill  ? cabotegravir & rilpivirine ER (CABENUVA) 600 & 900 MG/3ML injection Inject 1 kit into the muscle Espinoza 8 (eight) weeks. 6 mL 5  ? clonazePAM (KLONOPIN) 0.5 MG tablet Take 2 tablets (1 mg total) by mouth at bedtime. Follow titration instructions provided separately in writing. (Patient taking differently: Take 0.5 mg by mouth at bedtime. Follow titration instructions provided separately in  writing.) 60 tablet 3  ? furosemide (LASIX) 20 MG tablet Take 1 tablet (20 mg total) by mouth daily. 90 tablet 0  ? HUMIRA PEN 80 MG/0.8ML PNKT Inject 80 mg into the skin once a week.    ? metroNIDAZOLE (FLAGYL) 250 MG tablet Take 1 tablet (250 mg total) by mouth 3 (three) times daily. 270 tablet 3  ? olmesartan (BENICAR) 20 MG tablet Take 1 tablet by mouth once daily 90 tablet 3  ? rivaroxaban (XARELTO) 20 MG TABS tablet TAKE 1 TABLET BY MOUTH ONCE DAILY WITH SUPPER 90 tablet 3  ? Semaglutide,0.25 or 0.5MG/DOS, (OZEMPIC, 0.25 OR 0.5 MG/DOSE,) 2 MG/1.5ML SOPN Inject 0.5 mg into the skin once a week. 5.14 mL 1  ? ?No facility-administered medications prior to visit.  ? ? ? ?Past Medical History:  ?Diagnosis Date  ? Abscess   ? Between legs  ? Arthritis   ? all over  ? Asthma   ? Avascular necrosis of femoral head, left ([place])   ? Blood transfusion without reported diagnosis   ? Cholelithiasis   ? CKD (chronic kidney disease) stage 3, GFR 30-59 ml/min (HCC)   ? sees kidney Dr.  ? Clotting disorder ([place])   ? left DVT  ? Diverticulosis   ? DVT (deep venous thrombosis) ([place])   ? legs  ? Dyspnea   ? when walking  ? Dysrhythmia   ? remembers mother taking about having an irregular rhythm years when he was a child   ? GERD (gastroesophageal reflux disease)   ? HIV (human immunodeficiency  virus infection) ([place])   ? Hypertension   ? Morbid obesity ([place])   ? ? ? ?Past Surgical History:  ?Procedure Laterality Date  ? COLONOSCOPY  2019  ? DENTAL SURGERY    ? had teeth pulled  ? HYDRADENITIS EXCISION Left 10/14/2016  ? Procedure: WIDE EXCISION HIDRADENITIS LEFT AXILLA;  Surgeon: Coralie Keens, MD;  Location: [facility];  Service: General;  Laterality: Left;  ? JOINT REPLACEMENT    ? Left hip Dr. Ninfa Linden 11/29/16  ? TOTAL HIP ARTHROPLASTY Left 11/29/2016  ? Procedure: LEFT TOTAL HIP ARTHROPLASTY ANTERIOR APPROACH;  Surgeon: [name], MD;  Location: [facility];  Service: Orthopedics;  Laterality: Left;  ? ? ? ? ?Review of  Systems  ?Constitutional:  Negative for appetite change, chills, fatigue, fever and unexpected weight change.  ?Eyes:  Negative for visual disturbance.  ?Respiratory:  Negative for cough, chest tightness, shortness of breath and wheezing.   ?Cardiovascular:  Negative for chest pain and leg swelling.  ?Gastrointestinal:  Negative for abdominal pain, constipation, diarrhea, nausea and vomiting.  ?Genitourinary:  Negative for dysuria, flank pain, frequency, genital sores, hematuria and urgency.  ?Skin:  Negative for rash.  ?Allergic/Immunologic: Negative for immunocompromised state.  ?Neurological:  Negative for dizziness and headaches.  ?   ?Objective:  ?  ?BP (!) 156/86   Pulse (!) 50   Temp (!) 97.2 ?F (36.2 ?C) (Temporal)   Wt (!) 331 lb (150.1 kg)   SpO2 98%   BMI 44.89 kg/m?  ?Nursing note and vital signs reviewed. ? ?Physical Exam ?Constitutional:   ?   General: He is not in acute distress. ?   Appearance: He is well-developed.  ?Eyes:  ?   Conjunctiva/sclera: Conjunctivae normal.  ?Cardiovascular:  ?   Rate and Rhythm: Normal rate and regular rhythm.  ?   Heart sounds: Normal heart sounds. No murmur heard. ?  No friction rub. No gallop.  ?Pulmonary:  ?   Effort: Pulmonary effort is normal. No respiratory distress.  ?   Breath sounds: Normal breath sounds. No wheezing or rales.  ?Chest:  ?   Chest wall: No tenderness.  ?Abdominal:  ?   General: Bowel sounds are normal.  ?   Palpations: Abdomen is soft.  ?   Tenderness: There is no abdominal tenderness.  ?Musculoskeletal:  ?   Cervical back: Neck supple.  ?Lymphadenopathy:  ?   Cervical: No cervical adenopathy.  ?Skin: ?   General: Skin is warm and dry.  ?   Findings: No rash.  ?Neurological:  ?   Mental Status: He is alert and oriented to person, place, and time.  ?Psychiatric:     ?   Behavior: Behavior normal.     ?   Thought Content: Thought content normal.     ?   Judgment: Judgment normal.  ? ? ? ? ?  09/28/2020  ?  5:10 PM 08/17/2020  ?  8:54 AM  06/16/2020  ?  8:48 AM 04/20/2020  ? 10:20 AM 02/15/2020  ?  9:47 AM  ?Depression screen PHQ 2/9  ?Decreased Interest 0 0 0 0 0  ?Down, Depressed, Hopeless 0 0 0 0 0  ?PHQ - 2 Score 0 0 0 0 0  ?  ?   ?Assessment & Plan:  ? ? ?Patient Active Problem List  ? Diagnosis Date Noted  ? Multiple lipomas 03/10/2021  ? Healthcare maintenance 10/13/2020  ? Numbness 09/28/2020  ? Subcutaneous nodules 03/01/2019  ? Hypertension 10/19/2018  ?  Sleep disorder 03/05/2018  ? 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 Class 3 severe obesity due to excess calories with body mass index (BMI) of 45.0 to 49.9 in adult [facility]) 08/19/2016  ? Solitary pulmonary nodule 05/06/2016  ? Hidradenitis suppurativa 04/27/2016  ? Chest pain 04/26/2016  ? Avascular necrosis of bone of hip, left s/p TRH ([place]) 03/19/2016  ? Human immunodeficiency virus (HIV) disease ([place]) 03/18/2016  ? Chronic kidney disease 03/18/2016  ? Cholelithiasis 03/18/2016  ? Diverticulosis of colon without hemorrhage 03/18/2016  ? ? ? ?Problem List Items Addressed This Visit   ? ?  ? Other  ? Human immunodeficiency virus (HIV) disease ([place]) - Primary (Chronic)  ?  Allen Espinoza continues to have well controlled virus with good adherence and tolerance to [place]. No signs/symptoms of opportunistic infection. We reviewed previous lab work and discussed plan of care. Check lab work today. Injection of Cabenuva provided without complication. Plan for follow up in 2 months or sooner if needed.  ?  ?  ? Relevant Medications  ? cabotegravir & rilpivirine ER (CABENUVA) 600 & 900 MG/3ML injection 1 kit  ? Other Relevant Orders  ? Comprehensive metabolic panel  ? HIV-1 RNA quant-no reflex-bld  ? T-helper cell (CD4)- ([facility] clinic only)  ? Healthcare maintenance  ?  Discussed importance of safe sexual practice and condom use. Condoms offered.  ?Routine vaccinations up to date.  ?Has dentures, follow up with dentistry as needed.  ?  ?  ? ?Other  Visit Diagnoses   ? ? Screening for STDs (sexually transmitted diseases)      ? Relevant Orders  ? RPR  ? ?  ? ? ? ?I am having Allen Espinoza maintain his Humira Pen, clonazePAM, Ozempic (0.25 or 0.5 MG/DOSE), furosemide

## 2021-06-22 LAB — T-HELPER CELL (CD4) - (RCID CLINIC ONLY)
CD4 % Helper T Cell: 23 % — ABNORMAL LOW (ref 33–65)
CD4 T Cell Abs: 441 /uL (ref 400–1790)

## 2021-06-24 LAB — COMPREHENSIVE METABOLIC PANEL
AG Ratio: 0.9 (calc) — ABNORMAL LOW (ref 1.0–2.5)
ALT: 8 U/L — ABNORMAL LOW (ref 9–46)
AST: 11 U/L (ref 10–35)
Albumin: 3.8 g/dL (ref 3.6–5.1)
Alkaline phosphatase (APISO): 66 U/L (ref 35–144)
BUN/Creatinine Ratio: 13 (calc) (ref 6–22)
BUN: 18 mg/dL (ref 7–25)
CO2: 27 mmol/L (ref 20–32)
Calcium: 9 mg/dL (ref 8.6–10.3)
Chloride: 106 mmol/L (ref 98–110)
Creat: 1.44 mg/dL — ABNORMAL HIGH (ref 0.70–1.35)
Globulin: 4.1 g/dL (calc) — ABNORMAL HIGH (ref 1.9–3.7)
Glucose, Bld: 103 mg/dL — ABNORMAL HIGH (ref 65–99)
Potassium: 4 mmol/L (ref 3.5–5.3)
Sodium: 141 mmol/L (ref 135–146)
Total Bilirubin: 0.5 mg/dL (ref 0.2–1.2)
Total Protein: 7.9 g/dL (ref 6.1–8.1)

## 2021-06-24 LAB — RPR: RPR Ser Ql: NONREACTIVE

## 2021-06-24 LAB — HIV-1 RNA QUANT-NO REFLEX-BLD
HIV 1 RNA Quant: <20 Copies/mL — ABNORMAL HIGH
HIV-1 RNA Quant, Log: <1.3 Log cps/mL — ABNORMAL HIGH

## 2021-07-02 ENCOUNTER — Ambulatory Visit: Admit: 2021-07-02 | Discharge: 2021-07-03 | Payer: BLUE CROSS/BLUE SHIELD

## 2021-07-02 DIAGNOSIS — L732 Hidradenitis suppurativa: Secondary | ICD-10-CM | POA: Diagnosis not present

## 2021-07-02 NOTE — Unmapped (Signed)
Dermatology Note     Assessment and Plan:         Severe [place]ley 3 HS scrotal, inguinal and perirenal; improved on Humira, but flared with stopping metronidazole, in patient w/ hx of well controlled HIV  -We discussed the typical natural history, pathogenesis, treatment options, and expected course as well as the relapsing and sometimes recalcitrant nature of the disease.    - Discussed considering deroofing procedure for thighs and procedure for scrotal area with Reconstructive Urology, which patient deferred to both today  - Discussed increasing Humira dose to 80 mg weekly for better control  - Reviewed role of biologic therapy including humira and remicade   - Based on discussion above and r/b/a reviewed, mutual decision to proceed with the following:  - Continue Humira 40mg  [place] weekly    - Continue metronidazole 250 mg TID. Counseled regarding risk of peripheral neuropathy with long-term use.  - Start triamcinolone (KENALOG) 0.1 % ointment; Apply topically Two (2) times a day. To affected area(s) prn for dermatitis in the gluteal cleft, appropriate use discussed    High Risk Medication Use  -Quant gold negative 2021  -quant gold to be checked at [facility]    The patient was advised to call for an appointment should any new, changing, or symptomatic lesions develop.     RTC: Return in about 6 months (around 01/01/2022). or sooner as needed   _________________________________________________________________      Chief Complaint     Follow up of HS    HPI     Dustin Prince is a 61 y.o. male who presents as a returning patient (last seen by Dr. Janyth Contes on 01/31/2020) to [facility] for follow up of hidradenitis suppurativa. Since last visit patient was increased from 40mg  to 80mg  weekly adalimumab with overall good response and metronidazole 250 mg, tolerating them well without signs of peripheral neuropathy. Still has persistent drainage and bleeding sometimes from the scrotum and less often the inner thighs an buttocks.    Self-reported severity (0-5): 2  VAS pain today: 4  VAS average pain for the last month: 4  Requiring pain medication? No.  If so, what type/frequency? -  How often in pain?  continuously  Level of odor (0-5): 4  Level of itching (0-5): 1  Dressing changes needed for drainage:Several times a day  How much drainage: some drainage  Flare in the last month (Y/N)? Yes.  How long ago was the last flare? in last 6 months  Developing new lesions? once a month  Number of inflammatory lesions montly: 1-3  DLQI: 24  Current treatment: humira     How helpful is the current treatment in managing the following aspects of your disease?  Not at all helpful Somewhat helpful Very helpful   Pain  x    Decreasing length of flares  x    Decreasing new lesions  x    Drainage x     Decreasing frequency of flares      Decreasing severity of flares      Odor x       The patient denies any other new or changing lesions or areas of concern.     Pertinent Past Medical History     Social History:  Current or former smoker? never  Amount smoking: n/a  How many years: n/a  ED visits in the last 5 years? 6-10  Difficulty affording medications? most of the time  Marital Status: in relationship  Living with some  one? Yes.     Prior treatments:  Topical: topical ABX  Systemic: clidnamycin; doxy (not very helpful),   Past surgical procedures: large WLE left axilla c/b dehisence   Past laser procedures: none        ROS: the balance of 10 systems is negative unless otherwise documented    Physical Examination      OBJECTIVE:   Gen: Well-appearing patient, appropriate, interactive, in no acute distress  Skin: Examination of the buttocks, and external genitalia performed today and pertinent for:   -Cribriform scarring of scrotum, inguinal creases, and pubic area. Larger non-draining sinus of right scrotum/perineum.  -L inguinal 1 IN, 1 draining sinus 4-5cm, 1 NDS  -R inguinal 2 NDS  -perineum/scrotum with 2 DS, 1 IN. At least 50% of scrotum involved.    -All other areas not specifically commented on are within normal limits

## 2021-07-02 NOTE — Unmapped (Signed)
Patient Education        Hidradenitis Suppurativa: Care Instructions  Overview     Hidradenitis suppurativa (say hih-drad-uh-NY-tus sup-yur-uh-TY-vuh) is a skin condition that causes lumps on the skin that look like pimples or boils. The lumps are usually painful and can break open and drain blood and bad-smelling pus. The condition can come and go for many years.  Treatment for this condition may include antibiotics and other medicines. You may need surgery to remove the lumps. Home care includes wearing loose-fitting clothes and washing the area gently. You can help prevent lumps from coming back by staying at a healthy weight and not smoking.  Doctors don't know exactly how this condition starts. But they do know that something irritates and inflames the hair follicles, causing them to swell and form lumps. This skin condition can't be spread from person to person (isn't contagious).  Follow-up care is a key part of your treatment and safety. Be sure to make and go to all appointments, and call your doctor if you are having problems. It's also a good idea to know your test results and keep a list of the medicines you take.  How can you care for yourself at home?  Skin care    Wash the area every day with mild soap. Use your hands rather than a washcloth or sponge when you wash that part of your body.     Leave the affected areas uncovered when you can. If you have lumps that are draining, you can cover them with a bandage or other dressing. Put petroleum jelly (such as Vaseline) on the dressing to help keep it from sticking.     Wear-loose fitting clothes that don't rub against the area. Avoid activities that cause skin to rub together.     If you have pain, try a warm compress. Soak a towel or washcloth in warm water, wring it out, and place it on the affected skin for about 10 minutes.   Medicines    Be safe with medicines. Take your medicines exactly as prescribed. Call your doctor if you think you are having a problem with your medicine. You will get more details on the specific medicines your doctor prescribes.     If your doctor prescribed antibiotics, take them as directed. Do not stop taking them just because you feel better. You need to take the full course of antibiotics.   Lifestyle choices    If you smoke, think about quitting. Smoking can make the condition worse. If you need help quitting, talk to your doctor about stop-smoking programs and medicines. These can increase your chances of quitting for good.     Stay at a healthy weight, or lose weight, by eating healthy foods and being physically active. Being overweight could make this condition worse.   When should you call for help?   Call your doctor now or seek immediate medical care if:    You have symptoms of infection, such as:  Increased pain, swelling, warmth, or redness.  Red streaks leading from the area.  Pus draining from the area.  A fever.   Watch closely for changes in your health, and be sure to contact your doctor if:    You do not get better as expected.   Where can you learn more?  Go to [facility] at https://my[facility]chart.Armed forces logistics/support/administrative officer in the Menu. Enter [phone] in the search box to learn more about Hidradenitis Suppurativa: Care Instructions.  Current as  of: October 31, 2020               Content Version: 13.6  ?? 2006-2023 [employer].   Care instructions adapted under license by [facility]. If you have questions about a medical condition or this instruction, always ask your healthcare professional. [employer], Inc[employer] disclaims any warranty or liability for your use of this information.

## 2021-07-13 DIAGNOSIS — L732 Hidradenitis suppurativa: Principal | ICD-10-CM

## 2021-07-17 NOTE — Unmapped (Signed)
[facility] Refill Coordination Note    Specialty Medication(s) to be Shipped:   Inflammatory Disorders: Humira    Other medication(s) to be shipped: No additional medications requested for fill at this time     Dustin Prince, DOB: 03/09/1961  Phone: [phone] (home)       All above HIPAA information was verified with patient.     Was a Nurse, learning disability used for this call? No    Completed refill call assessment today to schedule patient's medication shipment from the [facility] [phone]).  All relevant notes have been reviewed.     Specialty medication(s) and dose(s) confirmed: Regimen is correct and unchanged.   Changes to medications: Dustin Prince reports no changes at this time.  Changes to insurance: No  New side effects reported not previously addressed with a pharmacist or physician: None reported  Questions for the pharmacist: No    Confirmed patient received a Conservation officer, historic buildings and a Surveyor, mining with first shipment. The patient will receive a drug information handout for each medication shipped and additional FDA Medication Guides as required.       DISEASE/MEDICATION-SPECIFIC INFORMATION        For patients on injectable medications: Patient currently has 0 doses left.  Next injection is scheduled for 07/24/21.    SPECIALTY MEDICATION ADHERENCE     Medication Adherence    Patient reported X missed doses in the last month: 0  Specialty Medication: HUMIRA  Patient is on additional specialty medications: No  Support network for adherence: family member              Were doses missed due to medication being on hold? No    Humira 80/0.8 mg/ml: 0 days of medicine on hand        REFERRAL TO PHARMACIST     Referral to the pharmacist: Not needed      [facility]     Shipping address confirmed in Epic.     Delivery Scheduled: Yes, Expected medication delivery date: 07/20/21.     Medication will be delivered via UPS to the prescription address in Epic WAM.    [facility] Specialty Technician

## 2021-07-19 MED FILL — HUMIRA(CF) PEN 80 MG/0.8 ML SUBCUTANEOUS KIT: 28 days supply | Qty: 4 | Fill #1

## 2021-07-25 ENCOUNTER — Emergency Department (HOSPITAL_COMMUNITY): Payer: Medicaid Other

## 2021-07-25 ENCOUNTER — Emergency Department (HOSPITAL_COMMUNITY)
Admission: EM | Admit: 2021-07-25 | Discharge: 2021-07-25 | Disposition: A | Discharge status: Home / Self Care | Payer: Medicaid Other | Attending: Emergency Medicine | Admitting: Emergency Medicine

## 2021-07-25 ENCOUNTER — Other Ambulatory Visit: Payer: Self-pay

## 2021-07-25 ENCOUNTER — Encounter (HOSPITAL_COMMUNITY): Payer: Self-pay | Admitting: Oncology

## 2021-07-25 DIAGNOSIS — Z21 Asymptomatic human immunodeficiency virus [HIV] infection status: Secondary | ICD-10-CM | POA: Diagnosis not present

## 2021-07-25 DIAGNOSIS — H538 Other visual disturbances: Secondary | ICD-10-CM | POA: Diagnosis not present

## 2021-07-25 DIAGNOSIS — R9082 White matter disease, unspecified: Secondary | ICD-10-CM | POA: Diagnosis not present

## 2021-07-25 DIAGNOSIS — R519 Headache, unspecified: Secondary | ICD-10-CM | POA: Insufficient documentation

## 2021-07-25 DIAGNOSIS — Z7901 Long term (current) use of anticoagulants: Secondary | ICD-10-CM | POA: Diagnosis not present

## 2021-07-25 LAB — BASIC METABOLIC PANEL
Anion gap: 8 (ref 5–15)
BUN: 17 mg/dL (ref 8–23)
CO2: 26 mmol/L (ref 22–32)
Calcium: 9 mg/dL (ref 8.9–10.3)
Chloride: 105 mmol/L (ref 98–111)
Creatinine, Ser: 1.5 mg/dL — ABNORMAL HIGH (ref 0.61–1.24)
GFR, Estimated: 52 mL/min — ABNORMAL LOW (ref >60)
Glucose, Bld: 103 mg/dL — ABNORMAL HIGH (ref 70–99)
Potassium: 4.1 mmol/L (ref 3.5–5.1)
Sodium: 139 mmol/L (ref 135–145)

## 2021-07-25 LAB — CBC WITH DIFFERENTIAL/PLATELET
Abs Immature Granulocytes: 0.03 10*3/uL (ref 0.00–0.07)
Basophils Absolute: 0 10*3/uL (ref 0.0–0.1)
Basophils Relative: 1 %
Eosinophils Absolute: 0.1 10*3/uL (ref 0.0–0.5)
Eosinophils Relative: 1 %
HCT: 47.6 % (ref 39.0–52.0)
Hemoglobin: 15.9 g/dL (ref 13.0–17.0)
Immature Granulocytes: 1 %
Lymphocytes Relative: 35 %
Lymphs Abs: 2.1 10*3/uL (ref 0.7–4.0)
MCH: 34.3 pg — ABNORMAL HIGH (ref 26.0–34.0)
MCHC: 33.4 g/dL (ref 30.0–36.0)
MCV: 102.8 fL — ABNORMAL HIGH (ref 80.0–100.0)
Monocytes Absolute: 0.6 10*3/uL (ref 0.1–1.0)
Monocytes Relative: 10 %
Neutro Abs: 3.2 10*3/uL (ref 1.7–7.7)
Neutrophils Relative %: 52 %
Platelets: 210 10*3/uL (ref 150–400)
RBC: 4.63 MIL/uL (ref 4.22–5.81)
RDW: 12.7 % (ref 11.5–15.5)
WBC: 6 10*3/uL (ref 4.0–10.5)
nRBC: 0 % (ref 0.0–0.2)

## 2021-07-25 LAB — SEDIMENTATION RATE: Sed Rate: 55 mm/hr — ABNORMAL HIGH (ref 0–16)

## 2021-07-25 LAB — C-REACTIVE PROTEIN: CRP: 0.7 mg/dL (ref <1.0)

## 2021-07-25 IMAGING — MR MR ORBITS WO/W CM
6 series · 41 of 48 positions shown · IV contrast (gadavist)
Comparison: Noncontrast MR head dated 1 day prior, CT venogram
obtained earlier the same day, brain MRI [DATE]

CLINICAL DATA: One month history of headache above right eye common
blurry vision, concern for optic neuritis

EXAM:
MRI HEAD AND ORBITS WITHOUT AND WITH CONTRAST
TECHNIQUE: Multiplanar, multiecho pulse sequences of the brain and surrounding
structures were obtained without and with intravenous contrast.
Multiplanar, multiecho pulse sequences of the orbits and surrounding
structures were obtained including fat saturation techniques, before
and after intravenous contrast administration.
CONTRAST:  10mL GADAVIST GADOBUTROL 1 MMOL/ML IV SOLN

[Series 6: T2 fat-sat · axial · 3.0mm · 0.47mm/px · z∈[-33,+28]mm · 6 of 20 slices shown (1 of 2)]
[im 1/20]
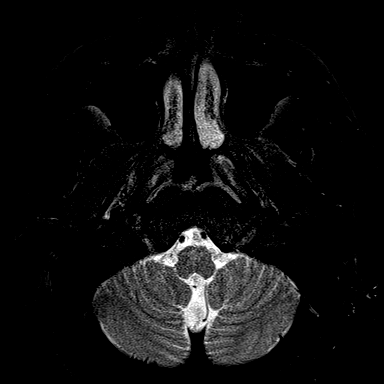
[im 4/20]
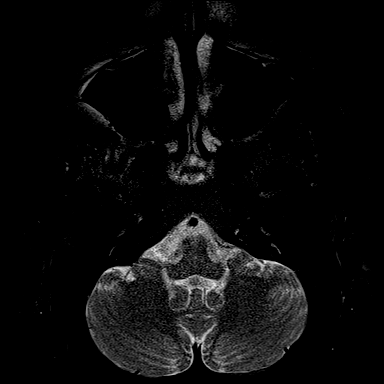
[im 8/20]
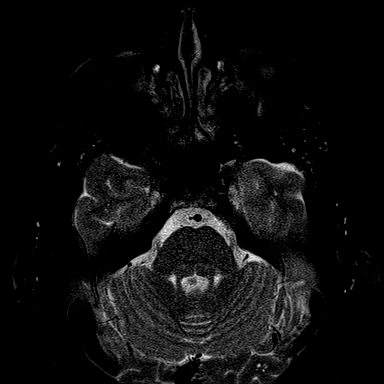
[im 12/20]
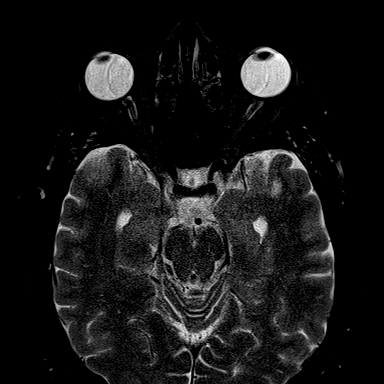
[im 16/20]
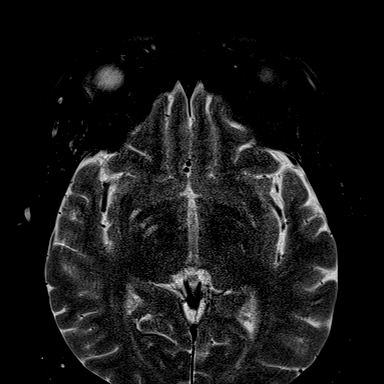
[im 20/20]
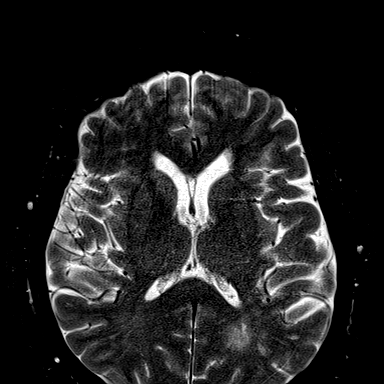

[Series 7: T1 · axial · 3.0mm · 0.56mm/px · z∈[-33,+28]mm · 6 of 20 slices shown (1 of 2)]
[im 1/20]
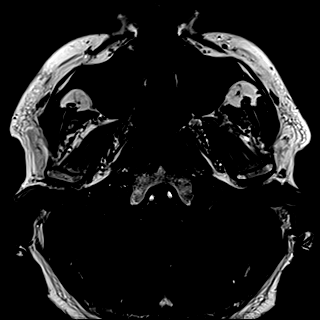
[im 4/20]
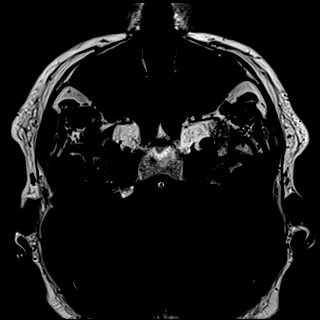
[im 8/20]
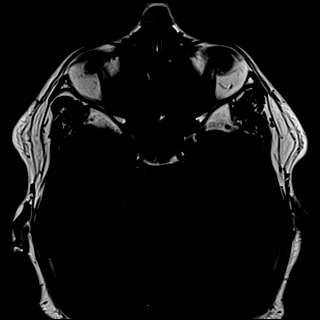
[im 12/20]
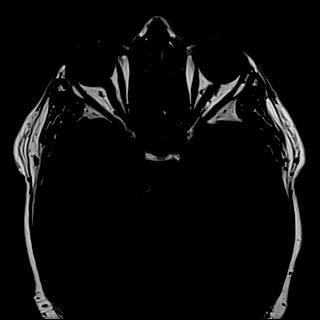
[im 16/20]
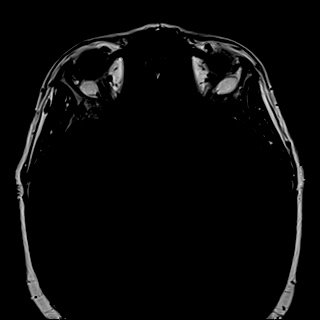
[im 20/20]
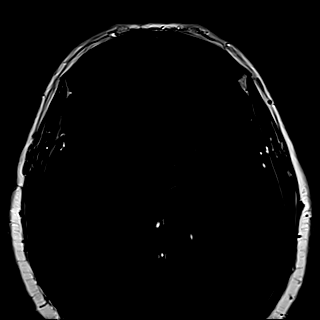

[Series 8: T2 fat-sat · coronal · 3.0mm · 0.47mm/px · 8 of 30 slices shown (2 of 2)]
[im 1/30]
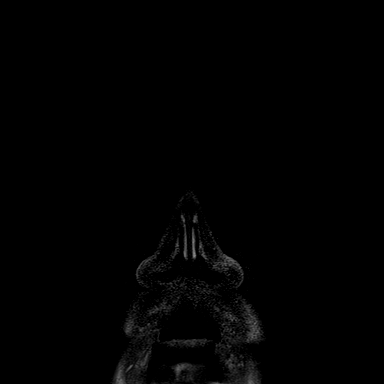
[im 4/30]
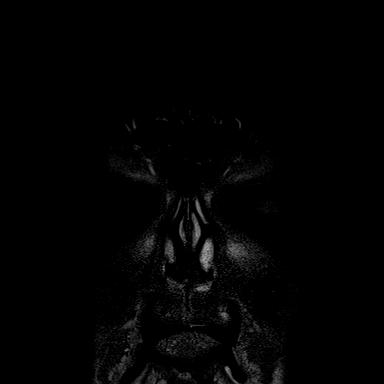
[im 10/30]
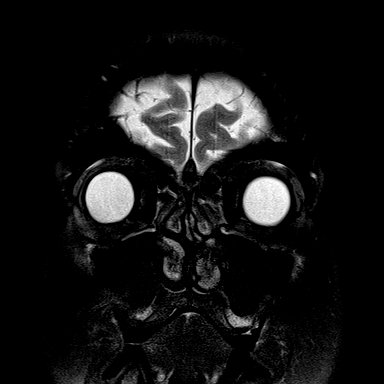
[im 13/30]
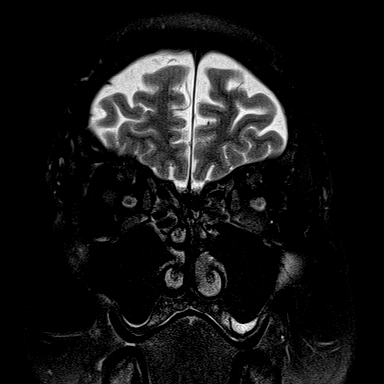
[im 17/30]
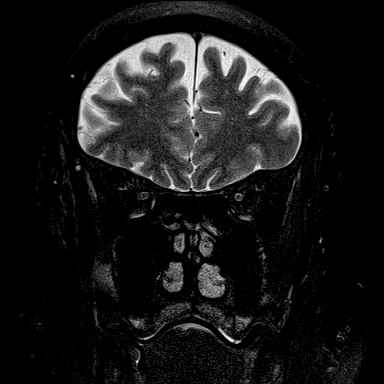
[im 20/30]
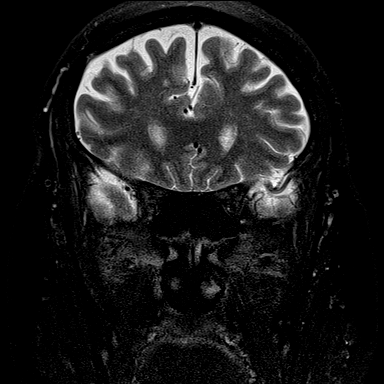
[im 26/30]
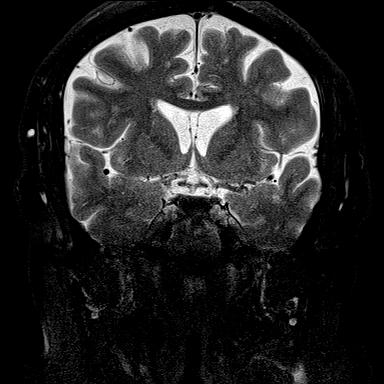
[im 30/30]
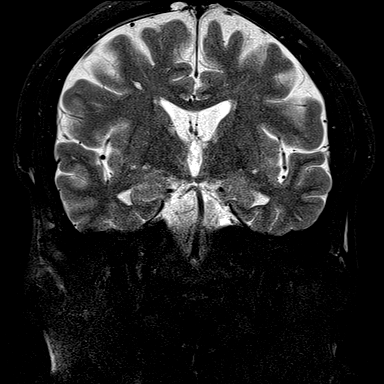

[Series 9: T1 · coronal · 3.0mm · 0.56mm/px · 8 of 30 slices shown (2 of 2)]
[im 1/30]
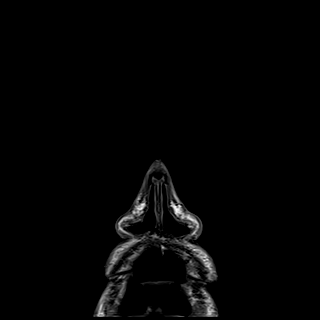
[im 4/30]
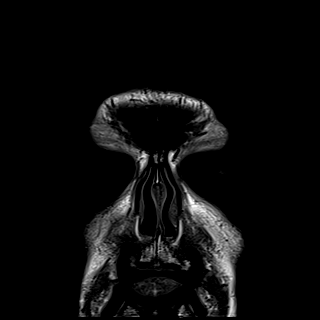
[im 10/30]
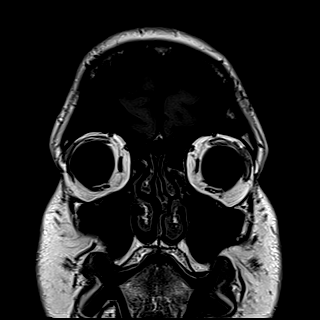
[im 13/30]
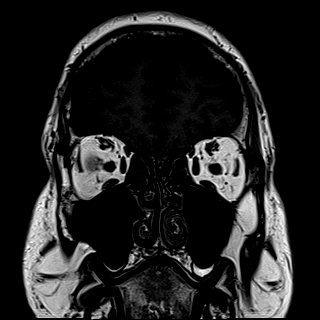
[im 17/30]
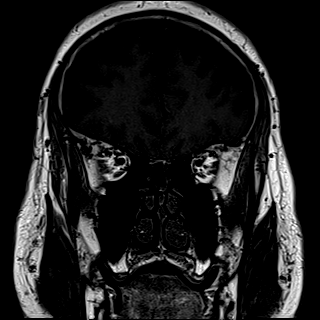
[im 20/30]
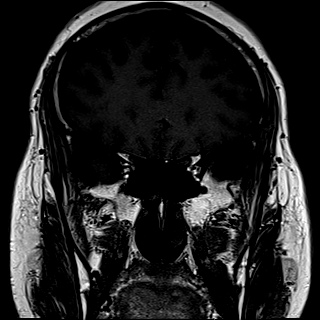
[im 26/30]
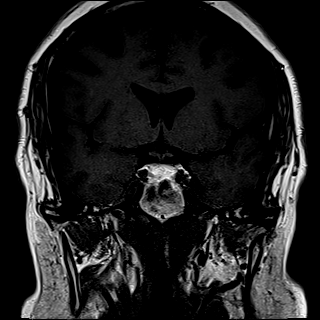
[im 30/30]
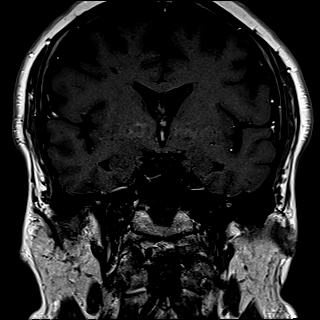

[Series 10: T1 fat-sat post-contrast · axial · 3.0mm · 0.56mm/px · z∈[-33,+28]mm · 6 of 20 slices shown (1 of 2)]
[im 1/20]
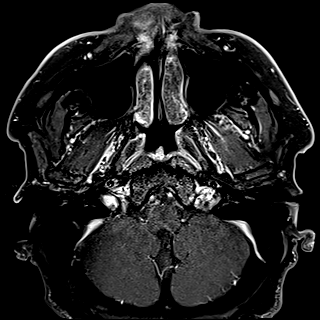
[im 4/20]
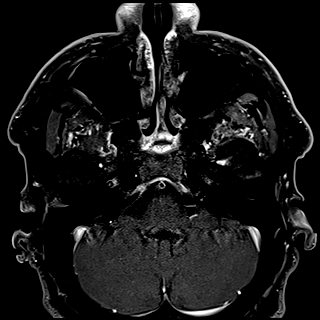
[im 8/20]
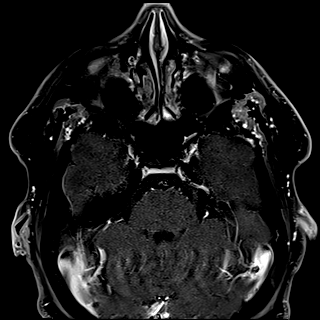
[im 12/20]
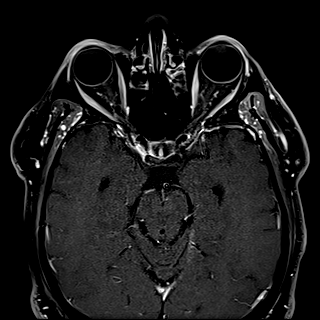
[im 16/20]
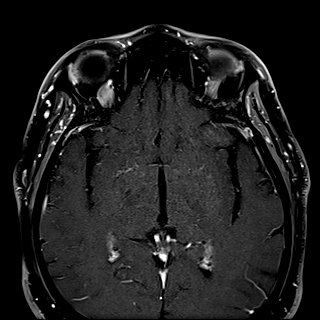
[im 20/20]
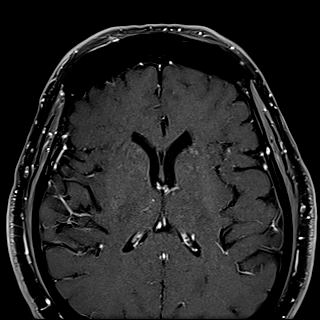

[Series 11: T1 fat-sat post-contrast · coronal · 3.0mm · 0.70mm/px · 7 of 30 slices shown (2 of 2)]
[im 1/30]
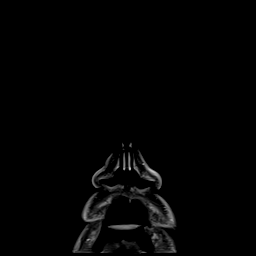
[im 4/30]
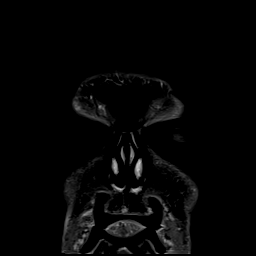
[im 10/30]
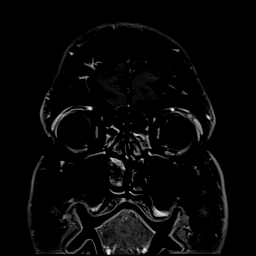
[im 13/30]
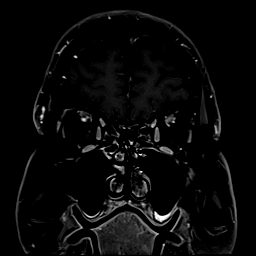
[im 17/30]
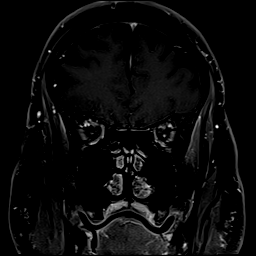
[im 20/30]
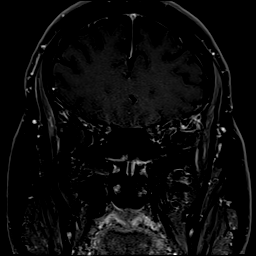
[im 26/30]
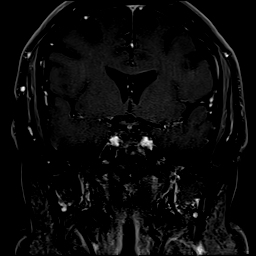

[41 of 48 positions shown; findings below may reference images not displayed]

FINDINGS: MRI HEAD FINDINGS

Brain: There is no evidence of acute intracranial hemorrhage,
extra-axial fluid collection, or acute infarct.

Parenchymal volume is within normal limits. The ventricles are
normal in size. Patchy FLAIR signal abnormality in the subcortical
and periventricular white matter most notably in the left parietal
lobe is nonspecific but likely reflects sequela of chronic white
matter microangiopathy, overall similar to the prior MRI. A small
focus of SWI signal dropout in the left superior frontal gyrus is
unchanged since [EV], nonspecific.

There is no suspicious parenchymal signal abnormality. There is no
mass lesion. There is no abnormal enhancement. There is no mass
effect or midline shift.

Vascular: Normal flow voids.

Skull and upper cervical spine: Normal marrow signal.

Other: None.

MRI ORBITS FINDINGS

Orbits:

Right: The globe is intact and normal. The orbital fat is preserved.
The extraocular muscles are normal. The optic nerve sheath complex
is normal. There is no abnormal enhancement. There is no mass lesion
or inflammatory change.

Left: The globe is intact and normal. The orbital fat is preserved.
The extraocular muscles are normal. The optic nerve sheath complex
is normal. There is no abnormal enhancement. There is no mass lesion
or inflammatory change.

Visualized sinuses: The paranasal sinuses are clear.

Soft tissues: Unremarkable.
IMPRESSION: 1. No acute intracranial or orbital pathology.
2. Stable FLAIR signal abnormality in the subcortical and
periventricular white matter likely reflecting sequela of chronic
small vessel disease.

## 2021-07-25 IMAGING — MR MR HEAD WO/W CM
13 series · 48 of 48 positions shown · IV contrast (gadavist)
Comparison: Noncontrast MR head dated 1 day prior, CT venogram
obtained earlier the same day, brain MRI [DATE]

CLINICAL DATA: One month history of headache above right eye common
blurry vision, concern for optic neuritis

EXAM:
MRI HEAD AND ORBITS WITHOUT AND WITH CONTRAST
TECHNIQUE: Multiplanar, multiecho pulse sequences of the brain and surrounding
structures were obtained without and with intravenous contrast.
Multiplanar, multiecho pulse sequences of the orbits and surrounding
structures were obtained including fat saturation techniques, before
and after intravenous contrast administration.
CONTRAST:  10mL GADAVIST GADOBUTROL 1 MMOL/ML IV SOLN

[Series 5: T1 · sagittal · 5.0mm · 0.75mm/px · 2 of 24 slices shown (1 of 2)]
[im 1/24]
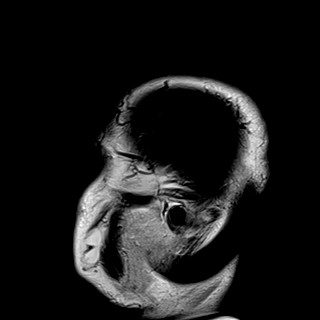
[im 24/24]
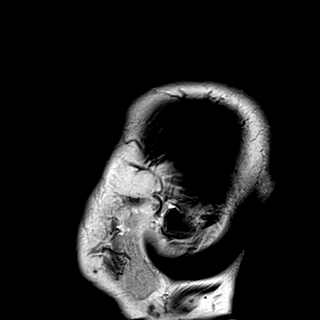

[Series 6: DWI · axial · 3.0mm · 1.36mm/px · z∈[-61,+96]mm · 6 of 108 slices shown (1 of 2)]
[im 1/108]
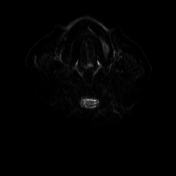
[im 22/108]
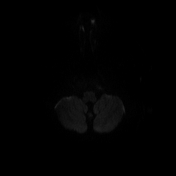
[im 43/108]
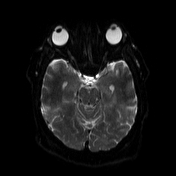
[im 65/108]
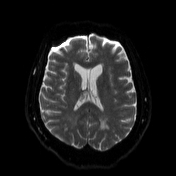
[im 86/108]
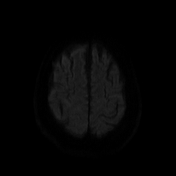
[im 108/108]
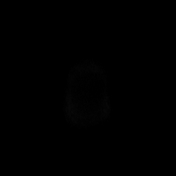

[Series 7: DWI · axial · 3.0mm · 1.36mm/px · z∈[-61,+96]mm · 3 of 54 slices shown (2 of 2)]
[im 1/54]
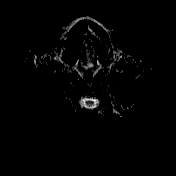
[im 27/54]
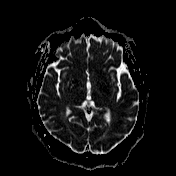
[im 54/54]
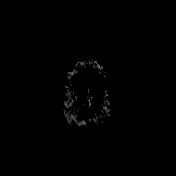

[Series 8: T2 · axial · 5.0mm · 0.62mm/px · z∈[-56,+105]mm · 2 of 26 slices shown]
[im 1/26]
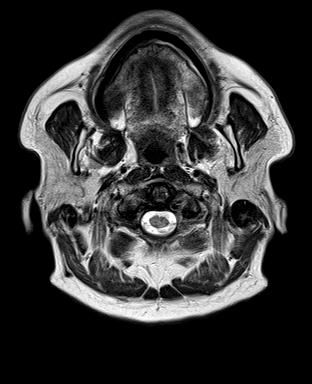
[im 26/26]
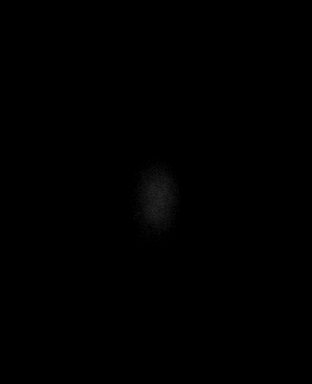

[Series 9: swi_images · axial · 3.0mm · 0.75mm/px · z∈[-63,+112]mm · 4 of 60 slices shown]
[im 1/60]
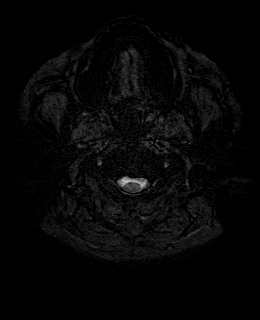
[im 20/60]
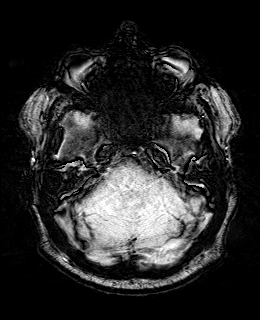
[im 40/60]
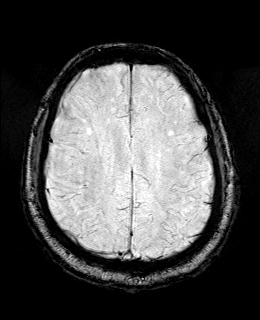
[im 60/60]
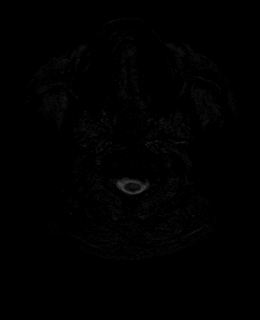

[Series 11: FLAIR · axial · 3.0mm · 0.75mm/px · z∈[-54,+103]mm · 3 of 54 slices shown]
[im 1/54]
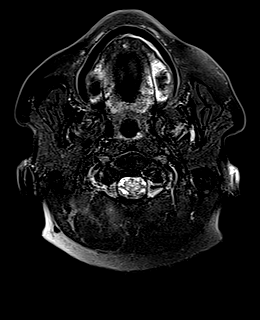
[im 27/54]
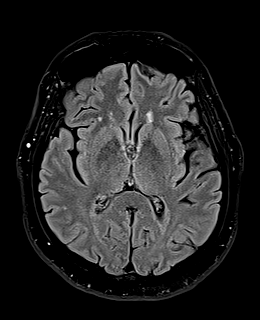
[im 54/54]
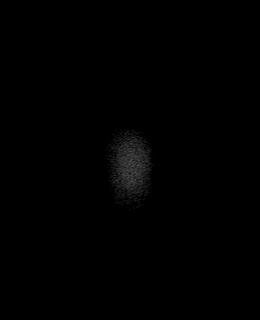

[Series 12: T1 · axial · 1.0mm · 0.94mm/px · z∈[-57,+100]mm · 9 of 160 slices shown (2 of 2)]
[im 1/160]
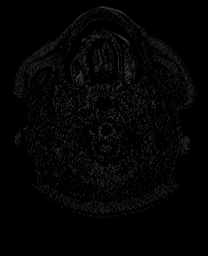
[im 20/160]
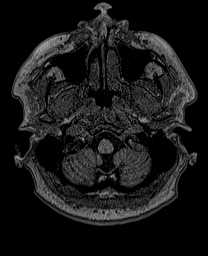
[im 40/160]
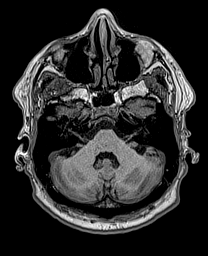
[im 60/160]
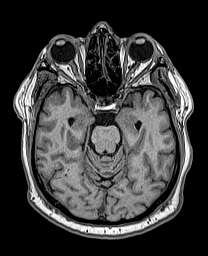
[im 80/160]
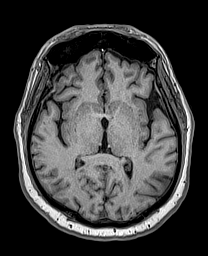
[im 100/160]
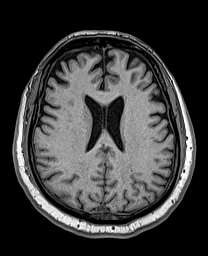
[im 120/160]
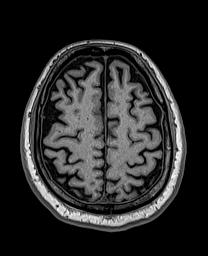
[im 140/160]
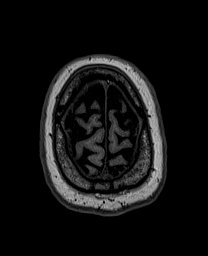
[im 160/160]
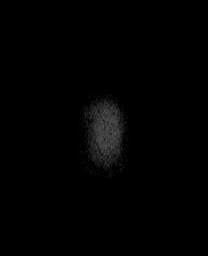

[Series 13: cor dwi_tracew · coronal · 5.0mm · 1.53mm/px · 3 of 54 slices shown]
[im 1/54]
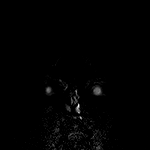
[im 27/54]
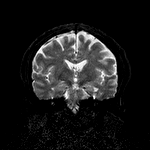
[im 54/54]
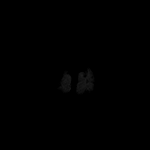

[Series 14: cor dwi_adc · coronal · 5.0mm · 1.53mm/px · 2 of 27 slices shown]
[im 1/27]
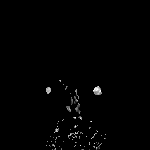
[im 27/27]
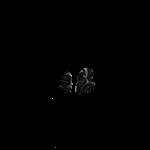

[Series 15: T2 post-contrast · coronal · 5.0mm · 0.57mm/px · 2 of 30 slices shown]
[im 1/30]
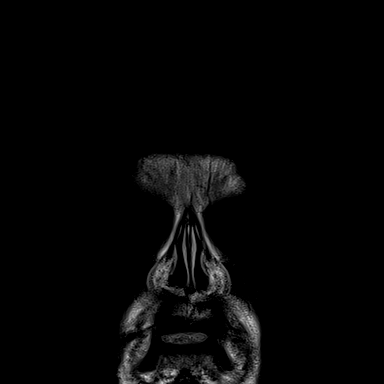
[im 30/30]
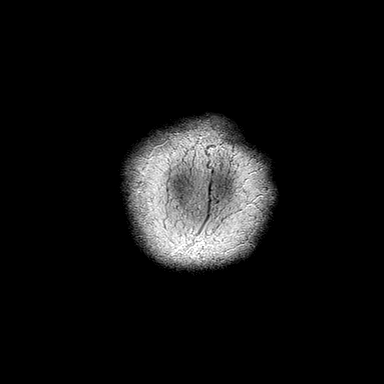

[Series 16: T1 post-contrast · axial · 1.0mm · 0.94mm/px · z∈[-57,+100]mm · 9 of 160 slices shown (1 of 3)]
[im 1/160]
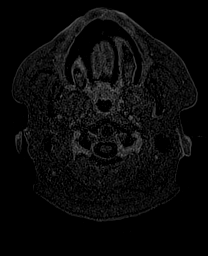
[im 20/160]
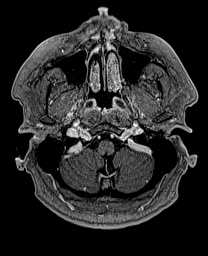
[im 40/160]
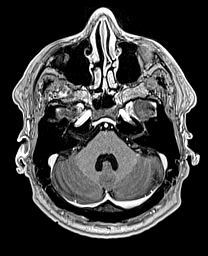
[im 60/160]
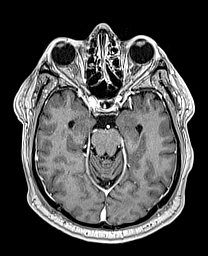
[im 80/160]
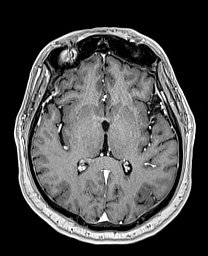
[im 100/160]
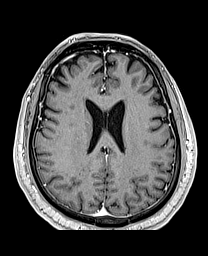
[im 120/160]
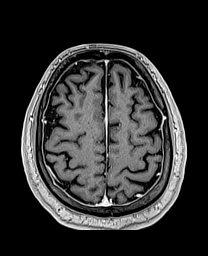
[im 140/160]
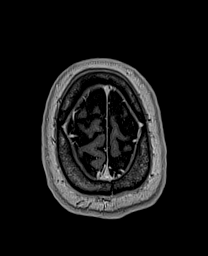
[im 160/160]
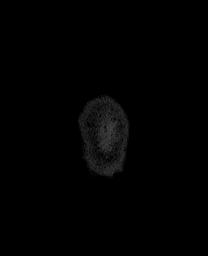

[Series 17: T1 post-contrast · coronal · 5.0mm · 0.43mm/px · 2 of 30 slices shown (2 of 3)]
[im 1/30]
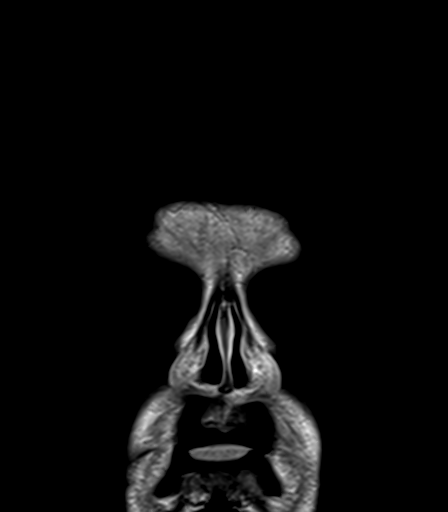
[im 30/30]
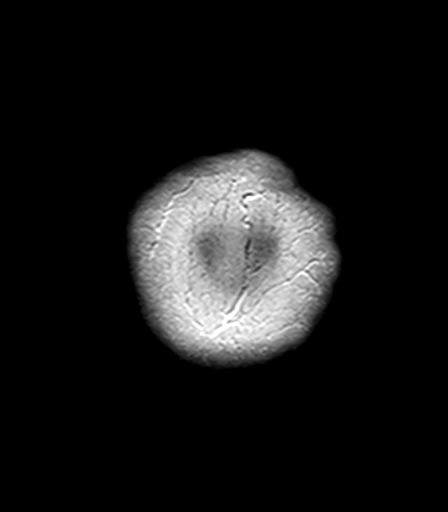

[Series 18: T1 post-contrast · sagittal · 5.0mm · 0.75mm/px · 1 of 24 slices shown (3 of 3)]
[im 1/24]
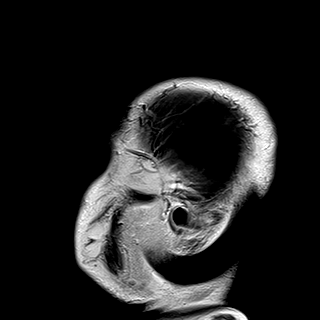

[48 of 48 positions shown; findings below may reference images not displayed]

FINDINGS: MRI HEAD FINDINGS

Brain: There is no evidence of acute intracranial hemorrhage,
extra-axial fluid collection, or acute infarct.

Parenchymal volume is within normal limits. The ventricles are
normal in size. Patchy FLAIR signal abnormality in the subcortical
and periventricular white matter most notably in the left parietal
lobe is nonspecific but likely reflects sequela of chronic white
matter microangiopathy, overall similar to the prior MRI. A small
focus of SWI signal dropout in the left superior frontal gyrus is
unchanged since [EV], nonspecific.

There is no suspicious parenchymal signal abnormality. There is no
mass lesion. There is no abnormal enhancement. There is no mass
effect or midline shift.

Vascular: Normal flow voids.

Skull and upper cervical spine: Normal marrow signal.

Other: None.

MRI ORBITS FINDINGS

Orbits:

Right: The globe is intact and normal. The orbital fat is preserved.
The extraocular muscles are normal. The optic nerve sheath complex
is normal. There is no abnormal enhancement. There is no mass lesion
or inflammatory change.

Left: The globe is intact and normal. The orbital fat is preserved.
The extraocular muscles are normal. The optic nerve sheath complex
is normal. There is no abnormal enhancement. There is no mass lesion
or inflammatory change.

Visualized sinuses: The paranasal sinuses are clear.

Soft tissues: Unremarkable.
IMPRESSION: 1. No acute intracranial or orbital pathology.
2. Stable FLAIR signal abnormality in the subcortical and
periventricular white matter likely reflecting sequela of chronic
small vessel disease.

## 2021-07-25 IMAGING — CT CT VENOGRAM HEAD
3 of 9 series · 16 of 47 positions shown · non-contrast
Comparison: Brain MRI [DATE].

CLINICAL DATA: 60-year-old male with 1 month of headache. Headache
above the right eye. Blurred vision. Symptoms intensify over the
course of each day.

EXAM:
CT VENOGRAM HEAD
TECHNIQUE: Venographic phase images of the brain were obtained following the
administration of intravenous contrast. Multiplanar reformats and
maximum intensity projections were generated.

[Series 4: coronal st · coronal · 0.32mm/px · 1 of 70 slices shown]
[im 35/70  brain]
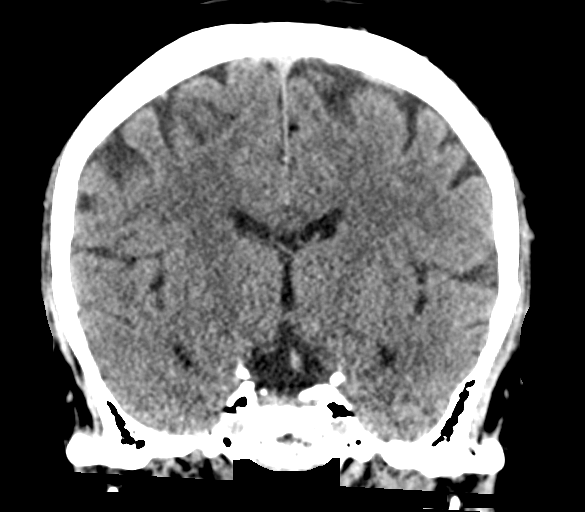

[Series 5: sagittal st · sagittal · 0.32mm/px · 1 of 62 slices shown]
[im 31/62  brain]
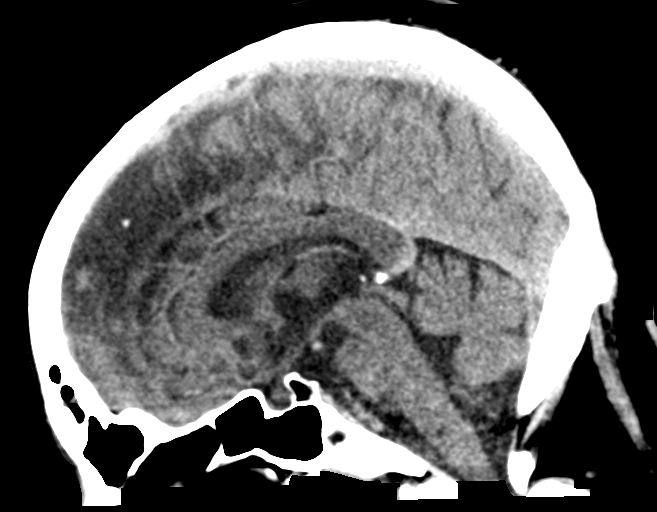

[Series 6: head venogram · axial · 0.50mm/px · z∈[-76,+68]mm · 14 of 84 slices shown]
[im 6/84  brain]
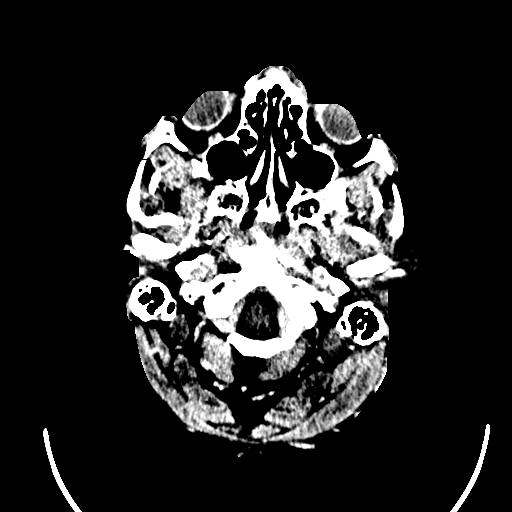
[im 12/84  bone]
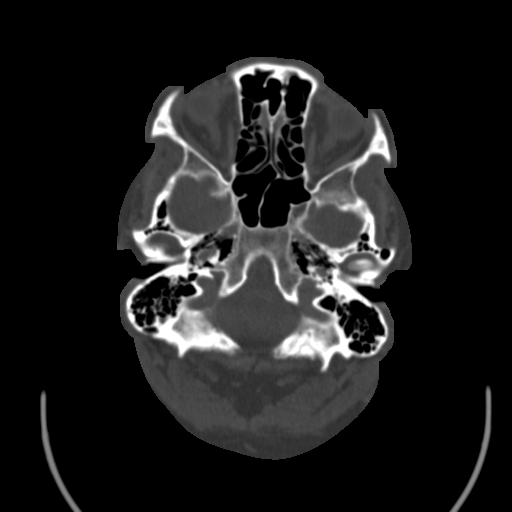
[im 17/84  brain]
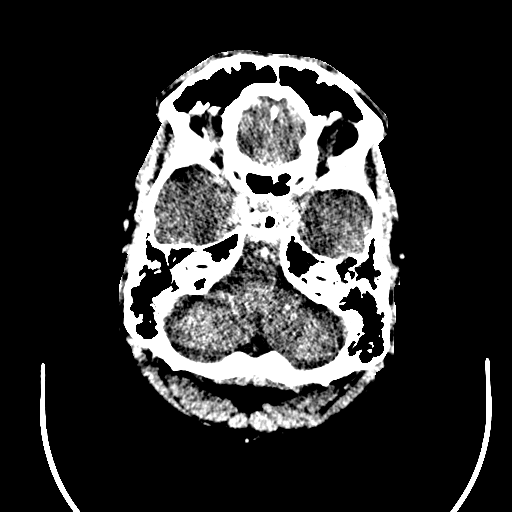
[im 23/84  bone]
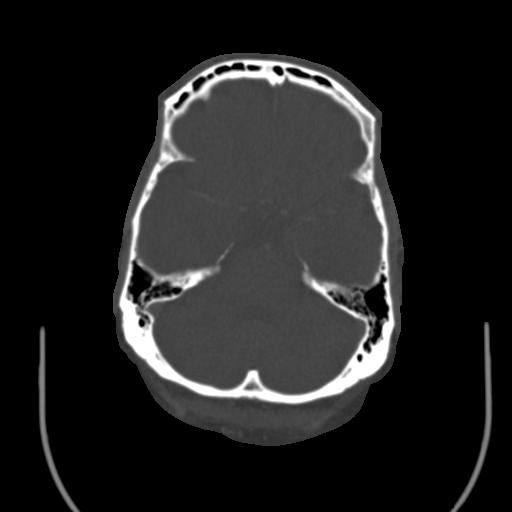
[im 28/84  brain]
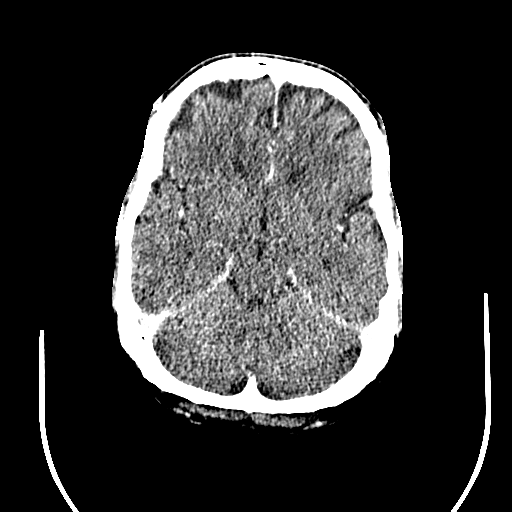
[im 34/84  bone]
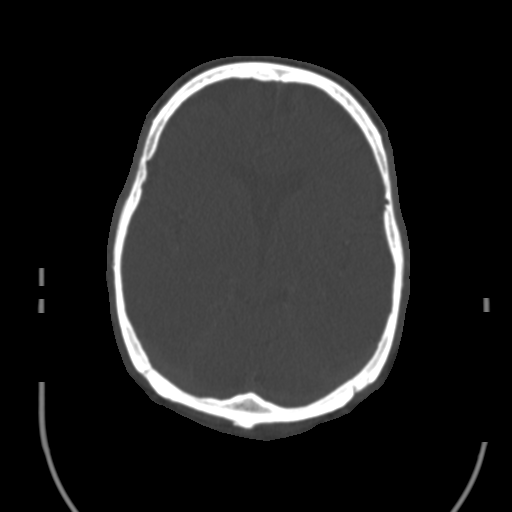
[im 39/84  brain]
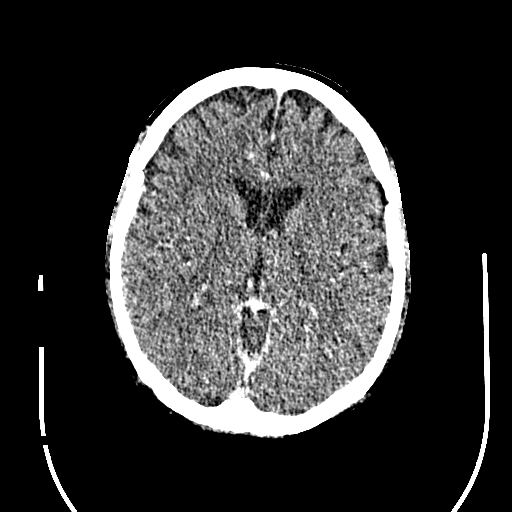
[im 45/84  bone]
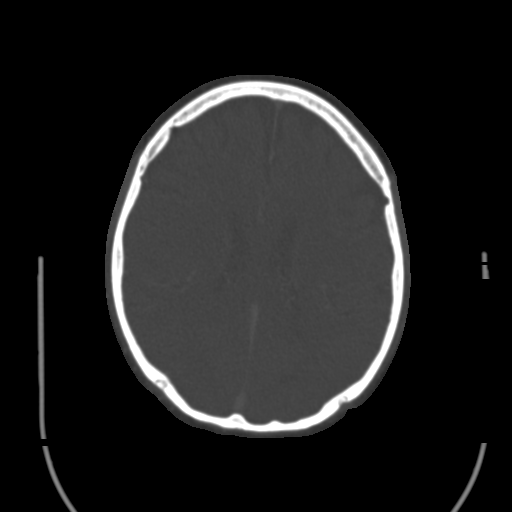
[im 50/84  brain]
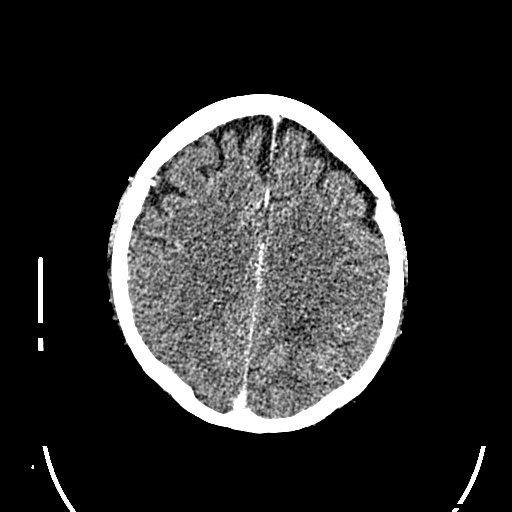
[im 56/84  bone]
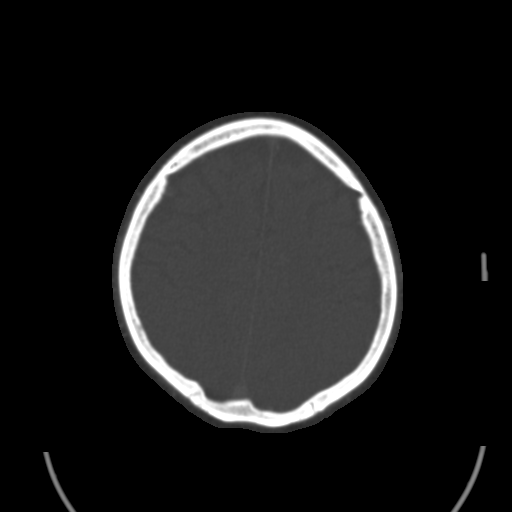
[im 61/84  brain]
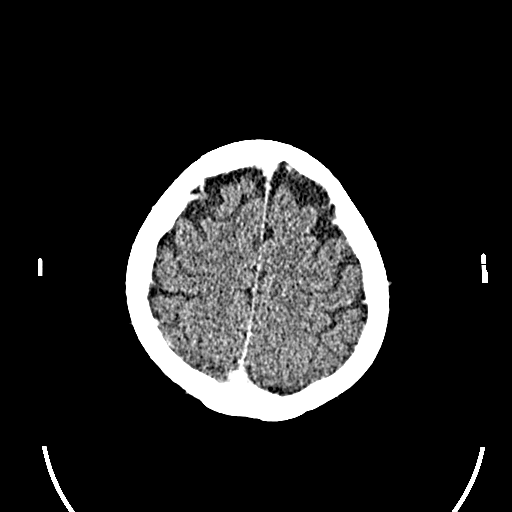
[im 67/84  bone]
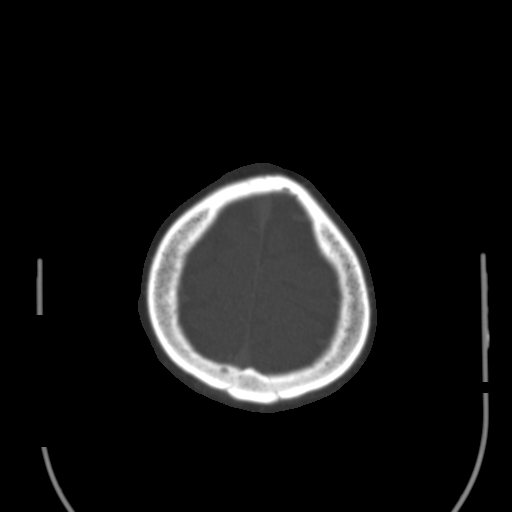
[im 72/84  brain]
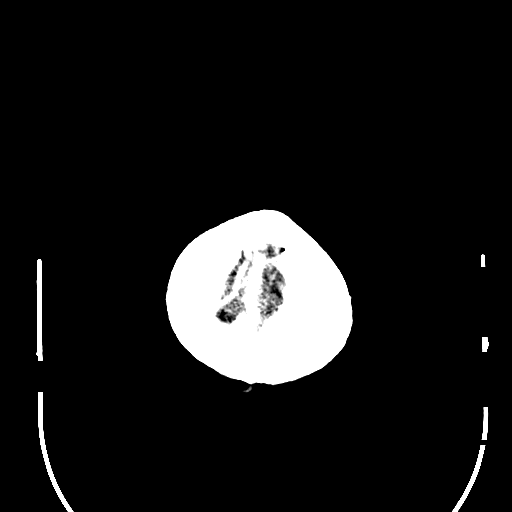
[im 78/84  bone]
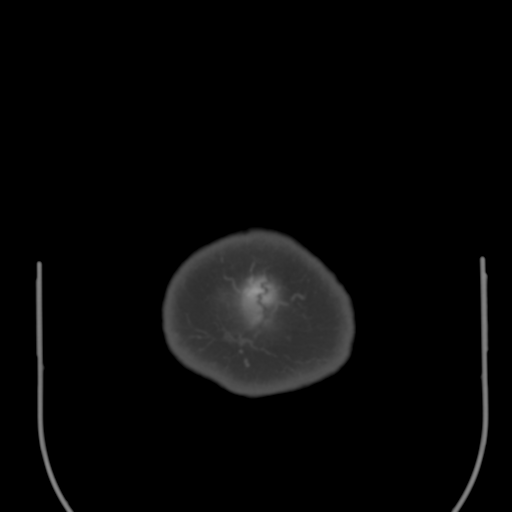

[16 of 47 positions shown; findings below may reference images not displayed]

RADIATION DOSE REDUCTION: This exam was performed according to the
departmental dose-optimization program which includes automated
exposure control, adjustment of the mA and/or kV according to
patient size and/or use of iterative reconstruction technique.

CONTRAST:  75mL OMNIPAQUE IOHEXOL 350 MG/ML SOLN
FINDINGS: CT HEAD

Brain: Cerebral volume appears stable from last year. No midline
shift, ventriculomegaly, mass effect, evidence of mass lesion,
intracranial hemorrhage or evidence of cortically based acute
infarction. Patchy mild to moderate cerebral white matter
hypodensity remains most pronounced in the left periatrial white
matter as demonstrated by MRI last year.

Following contrast No abnormal enhancement identified.

Calvarium and skull base: No acute osseous abnormality identified.

Paranasal sinuses: Hyperplastic but clear visible paranasal sinuses,
tympanic cavities, mastoids.

Orbits: Visualized orbits and scalp soft tissues are within normal
limits.

CTV Venous sinuses: Postcontrast enhancement of the superior
sagittal sinus (physiologic arachnoid granulation series 8, image
40), torcula, straight sinus, vein of MORTENSEN,, transverse sinuses and
sigmoid sinuses is within normal limits. Likewise, cavernous sinuses
appear normally enhancing.

Anatomic variants: None related to the venous system.

Other findings: Major intracranial arterial structures also appear
to be grossly patent. Symmetric and unremarkable CT appearance of
the orbits. No enlargement of the superior ophthalmic veins which
appear to be enhancing and patent.

Review of the MIP images confirms the above findings
IMPRESSION: 1. Negative for dural venous sinus thrombosis.

2. No acute intracranial abnormality by CT. Mild to moderate for age
nonspecific white matter disease appears stable compared to brain
MRI last year.

## 2021-07-25 MED ORDER — IOHEXOL 350 MG/ML SOLN
75.0000 mL | Freq: Once | INTRAVENOUS | Status: AC | PRN
  Administered 2021-07-25: 75 mL via INTRAVENOUS

## 2021-07-25 MED ORDER — GABAPENTIN 300 MG PO CAPS: 300.0000 mg | ORAL_CAPSULE | Freq: Three times a day (TID) | ORAL | 0 refills | Status: DC

## 2021-07-25 MED ORDER — GADOBUTROL 1 MMOL/ML IV SOLN
10.0000 mL | Freq: Once | INTRAVENOUS | Status: AC | PRN
  Administered 2021-07-25: 10 mL via INTRAVENOUS

## 2021-07-25 MED ORDER — LORAZEPAM 2 MG/ML IJ SOLN: 1.0000 mg | Freq: Once | INTRAMUSCULAR | Status: DC | PRN

## 2021-07-25 MED ORDER — TETRACAINE HCL 0.5 % OP SOLN
2.0000 [drp] | Freq: Once | OPHTHALMIC | Status: AC
  Administered 2021-07-25: 2 [drp] via OPHTHALMIC
  Filled 2021-07-25: qty 4

## 2021-07-25 NOTE — Discharge Instructions (Addendum)
If you develop continued, recurrent, or worsening headache, fever, neck stiffness, vomiting, blurry or double vision, weakness or numbness in your arms or legs, trouble speaking, or any other new/concerning symptoms then return to the ER for evaluation.  

## 2021-07-25 NOTE — ED Provider Notes (Signed)
?[facility] ?Provider Note ? ? ?CSN: [id number] ?Arrival date & time: 07/25/21  1856 ? ?  ? ?History ? ?Chief Complaint  ?Patient presents with  ? Headache  ? ? ?[facility]ino[facility] is a 61 y.o. male. ? 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  He has had a right-sided headache for about 1 month.  It occurs each day.  When he wakes up there is no headache and it progressively worsened throughout the day.  It is pretty severe in the evening.  No fevers or night sweats or weight loss.  He originally reports blurry vision when asked but when asked to clarify this has been an ongoing problem and is not new.  There is no ocular pain but the pain is around his right eye.  Sometimes it waters.  No weakness, numbness, neck pain or stiffness.  He tried some sinus medicine when this first started which seem to partially help but that is no longer helping.  Currently has no pain. ? ?Home Medications ?Prior to Admission medications   ?Medication Sig Start Date End Date Taking? Authorizing Provider  ?gabapentin (NEURONTIN) 300 MG capsule Take 1 capsule (300 mg total) by mouth 3 (three) times daily for 3 days. 07/25/21 07/28/21 Yes Sherwood Gambler, MD  ?Tyler Aas & rilpivirine ER (CABENUVA) 600 & 900 MG/3ML injection Inject 1 kit into the muscle every 8 (eight) weeks. 06/11/21 06/11/22  Kuppelweiser, [name], RPH-CPP  ?clonazePAM (KLONOPIN) 0.5 MG tablet Take 2 tablets (1 mg total) by mouth at bedtime. Follow titration instructions provided separately in writing. ?Patient taking differently: Take 0.5 mg by mouth at bedtime. Follow titration instructions provided separately in writing. 11/01/20   [name], MD  ?furosemide (LASIX) 20 MG tablet Take 1 tablet (20 mg total) by mouth daily. 04/09/21   Delene Ruffini, MD  ?HUMIRA PEN 80 MG/0.8ML PNKT Inject 80 mg into the skin once a week. 08/08/20   [provider]  ?metroNIDAZOLE (FLAGYL) 250 MG tablet  Take 1 tablet (250 mg total) by mouth 3 (three) times daily. 05/11/21   [name], MD  ?olmesartan [facility]) 20 MG tablet Take 1 tablet by mouth once daily 05/11/21   [name], MD  ?rivaroxaban (XARELTO) 20 MG TABS tablet TAKE 1 TABLET BY MOUTH ONCE DAILY WITH SUPPER 05/11/21   [name], MD  ?Semaglutide,0.25 or 0.5MG/DOS, (OZEMPIC, 0.25 OR 0.5 MG/DOSE,) 2 MG/1.5ML SOPN Inject 0.5 mg into the skin once a week. 03/09/21   Madalyn Rob, MD  ?   ? ?Allergies    ?Sulfa antibiotics and Vancomycin   ? ?Review of Systems   ?Review of Systems  ?Constitutional:  Negative for fever.  ?Eyes:  Negative for photophobia.  ?Musculoskeletal:  Negative for neck pain and neck stiffness.  ?Neurological:  Positive for headaches. Negative for weakness and numbness.  ? ?Physical Exam ?Updated Vital Signs ?BP 138/77   Pulse (!) 51   Temp 98.2 ?F (36.8 ?C) (Oral)   Resp 18   Ht 6' (1.829 m)   Wt (!) 149.7 kg   SpO2 100%   BMI 44.76 kg/m?  ?Physical Exam ?Vitals and nursing note reviewed.  ?Constitutional:   ?   Appearance: He is well-developed.  ?HENT:  ?   Head: Normocephalic and atraumatic.  ?   Comments: No tenderness superior to orbit or over right temple, but patient indicates those are the areas where he hurts when  it hurts. ?Eyes:  ?   Extraocular Movements: Extraocular movements intact.  ?   Pupils: Pupils are equal, round, and reactive to light.  ?   Comments: IOP 19 in right eye  ?Cardiovascular:  ?   Rate and Rhythm: Normal rate and regular rhythm.  ?   Heart sounds: Normal heart sounds.  ?Pulmonary:  ?   Effort: Pulmonary effort is normal.  ?   Breath sounds: Normal breath sounds.  ?Abdominal:  ?   Palpations: Abdomen is soft.  ?   Tenderness: There is no abdominal tenderness.  ?Musculoskeletal:  ?   Cervical back: Normal range of motion. No rigidity.  ?Skin: ?   General: Skin is warm and dry.  ?Neurological:  ?   Mental Status: He is alert.  ?   Comments: CN 3-12 grossly intact. 5/5  strength in all 4 extremities. Grossly normal sensation. Normal finger to nose.   ? ? ?ED Results / Procedures / Treatments   ?Labs ?(all labs ordered are listed, but only abnormal results are displayed) ?Labs Reviewed  ?BASIC METABOLIC PANEL - Abnormal; Notable for the following components:  ?    Result Value  ? Glucose, Bld 103 (*)   ? Creatinine, Ser 1.50 (*)   ? GFR, Estimated 52 (*)   ? All other components within normal limits  ?CBC WITH DIFFERENTIAL/PLATELET - Abnormal; Notable for the following components:  ? MCV 102.8 (*)   ? MCH 34.3 (*)   ? All other components within normal limits  ?SEDIMENTATION RATE - Abnormal; Notable for the following components:  ? Sed Rate 55 (*)   ? All other components within normal limits  ?C-REACTIVE PROTEIN  ? ? ?EKG ?None ? ?Radiology ?MR Brain W and Wo Contrast ? ?Result Date: 07/25/2021 ?CLINICAL DATA:  One month history of headache above right eye common blurry vision, concern for optic neuritis EXAM: MRI HEAD AND ORBITS WITHOUT AND WITH CONTRAST TECHNIQUE: Multiplanar, multiecho pulse sequences of the brain and surrounding structures were obtained without and with intravenous contrast. Multiplanar, multiecho pulse sequences of the orbits and surrounding structures were obtained including fat saturation techniques, before and after intravenous contrast administration. CONTRAST:  15m GADAVIST GADOBUTROL 1 MMOL/ML IV SOLN COMPARISON:  Noncontrast MR head dated 1 day prior, CT venogram obtained earlier the same day, brain MRI 09/24/2018 FINDINGS: MRI HEAD FINDINGS Brain: There is no evidence of acute intracranial hemorrhage, extra-axial fluid collection, or acute infarct. Parenchymal volume is within normal limits. The ventricles are normal in size. 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 A small focus of SWI signal dropout in the left superior frontal gyrus is unchanged since 2022, nonspecific. There is no suspicious parenchymal signal abnormality. There is no mass lesion. There is no abnormal enhancement. There is no mass effect or midline shift. Vascular: Normal flow voids. Skull and upper cervical spine: Normal marrow signal. Other: None. MRI ORBITS FINDINGS Orbits: Right: The globe is intact and normal. The orbital fat is preserved. The extraocular muscles are normal. The optic nerve sheath complex is normal. There is no abnormal enhancement. There is no mass lesion or inflammatory change. Left: The globe is intact and normal. The orbital fat is preserved. The extraocular muscles are normal. The optic nerve sheath complex is normal. There is no abnormal enhancement. There is no mass lesion  or inflammatory change. Visualized sinuses: The paranasal sinuses are clear. Soft tissues: Unremarkable. IMPRESSION: 1. No acute intracranial or orbital pathology. 2. Stable FLAIR signal abnormality in the subcortical and periventricular white matter likely reflecting sequela of chronic small vessel disease. Electronically Signed   By: Valetta Mole M.D.   On: 07/25/2021 12:27  ? ?CT VENOGRAM HEAD ? ?Result Date: 07/25/2021 ?CLINICAL DATA:  61 year old male with 1 month of headache. Headache above the right eye. Blurred vision. Symptoms intensify over the course of each day. EXAM: CT VENOGRAM HEAD TECHNIQUE: Venographic phase images of the brain were obtained following the administration of intravenous contrast. Multiplanar reformats and maximum intensity projections were generated. RADIATION DOSE REDUCTION: This exam was performed according to the departmental dose-optimization program which includes automated exposure control, adjustment of the mA and/or kV according to patient size and/or use of iterative reconstruction technique. CONTRAST:  67m OMNIPAQUE IOHEXOL 350 MG/ML SOLN COMPARISON:  Brain  MRI 11/23/2020. FINDINGS: CT HEAD Brain: Cerebral volume appears stable from last year. No midline shift, ventriculomegaly, mass effect, evidence of mass lesion, intracranial hemorrhage or evidence of cortically b

## 2021-07-25 NOTE — ED Notes (Signed)
Patient transported to MRI 

## 2021-07-25 NOTE — ED Triage Notes (Signed)
Pt reports one month hx of HA above right eye. States he wakes up w/ no pain however as the day progresses pain intensifies. +blurred vision.  ?

## 2021-07-26 ENCOUNTER — Telehealth: Payer: Self-pay

## 2021-07-26 NOTE — Telephone Encounter (Signed)
Transition Care Management Unsuccessful Follow-up Telephone Call ? ?Date of discharge and from where:  07/26/2021-[facility] ? ?Attempts:  1st Attempt ? ?Reason for unsuccessful TCM follow-up call:  Left voice message ? ?  ?

## 2021-07-27 NOTE — Telephone Encounter (Signed)
Transition Care Management Follow-up Telephone Call ?Date of discharge and from where: 07/26/2021-[facility] ?How have you been since you were released from the hospital? Patient stated that he is feeling better and the medication is helping.  ?Any questions or concerns? No ? ?Items Reviewed: ?Did the pt receive and understand the discharge instructions provided? Yes  ?Medications obtained and verified? Yes  ?Other? No  ?Any new allergies since your discharge? No  ?Dietary orders reviewed? No ?Do you have support at home? Yes  ? ?Functional Questionnaire: (I = Independent and D = Dependent) ?ADLs: I ? ?Bathing/Dressing- I ? ?Meal Prep- I ? ?Eating- I ? ?Maintaining continence- I ? ?Transferring/Ambulation- I ? ?Managing Meds- I ? ? ?Follow up appointments reviewed: ? ?PCP Hospital f/u appt confirmed? No  [name] is patient PCP. EMR updated.  ?[facility] f/u appt confirmed? Yes  Scheduled to see [facility] on 08/23/2021 @ 09:20am. ?Are transportation arrangements needed? No  ?If their condition worsens, is the pt aware to call PCP or go to the Emergency Dept.? Yes ?Was the patient provided with contact information for the PCP's office or ED? Yes ?Was to pt encouraged to call back with questions or concerns? Yes ? ?

## 2021-08-07 ENCOUNTER — Ambulatory Visit: Payer: Medicaid Other | Admitting: Neurology

## 2021-08-07 ENCOUNTER — Encounter: Payer: Self-pay | Admitting: Neurology

## 2021-08-07 VITALS — BP 156/68 | HR 44 | Ht 72.0 in | Wt 329.5 lb

## 2021-08-07 DIAGNOSIS — R0683 Snoring: Secondary | ICD-10-CM | POA: Diagnosis not present

## 2021-08-07 DIAGNOSIS — R519 Headache, unspecified: Secondary | ICD-10-CM | POA: Diagnosis not present

## 2021-08-07 DIAGNOSIS — G4752 REM sleep behavior disorder: Secondary | ICD-10-CM

## 2021-08-07 MED ORDER — GABAPENTIN 100 MG PO CAPS: 100.0000 mg | ORAL_CAPSULE | Freq: Three times a day (TID) | ORAL | 5 refills | Status: DC
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
## 2021-08-07 NOTE — Patient Instructions (Signed)
It was nice to see you again today.  ?As discussed, we will do a home sleep test to re-evaluate you for sleep apnea. If you test positive for sleep apnea, I will likely recommend an autoPAP machine for treatment, similar to the model I showed you. ?Please find a new optometrist who takes your insurance and make an appointment for an update eye exams, you may need new eye glasses.  ?We will start you on Neurontin (gabapentin) 100 mg strength: Take 1 pill 3 times a day. The most common side effects reported are sedation or sleepiness. Rare side effects include balance problems, confusion.  ?We will call you with the home sleep test results and make a follow up appointment too. ?

## 2021-08-09 ENCOUNTER — Telehealth: Payer: Self-pay

## 2021-08-09 NOTE — Telephone Encounter (Signed)
Called pt to schedule his HST but pt states he will call back later to schedule. ?

## 2021-08-10 NOTE — Unmapped (Signed)
[facility] Refill Coordination Note    Specialty Medication(s) to be Shipped:   Inflammatory Disorders: Humira    Other medication(s) to be shipped: No additional medications requested for fill at this time     Dustin Prince, DOB: [birth year]  Phone: [phone] (home)       All above HIPAA information was verified with patient.     Was a Nurse, learning disability used for this call? No    Completed refill call assessment today to schedule patient's medication shipment from the [facility] [phone]).  All relevant notes have been reviewed.     Specialty medication(s) and dose(s) confirmed: Regimen is correct and unchanged.   Changes to medications: [name] reports no changes at this time.  Changes to insurance: No  New side effects reported not previously addressed with a pharmacist or physician: None reported  Questions for the pharmacist: No    Confirmed patient received a Conservation officer, historic buildings and a Surveyor, mining with first shipment. The patient will receive a drug information handout for each medication shipped and additional FDA Medication Guides as required.       DISEASE/MEDICATION-SPECIFIC INFORMATION        For patients on injectable medications: Patient currently has 1 doses left.  Next injection is scheduled for 08/14/2021.    SPECIALTY MEDICATION ADHERENCE     Medication Adherence    Patient reported X missed doses in the last month: 0  Specialty Medication: Humira  Patient is on additional specialty medications: No  Any gaps in refill history greater than 2 weeks in the last 3 months: no  Demonstrates understanding of importance of adherence: yes  Informant: patient  Reliability of informant: reliable  Support network for adherence: family member  Confirmed plan for next specialty medication refill: delivery by pharmacy  Refills needed for supportive medications: not needed              Were doses missed due to medication being on hold? No    Humira 80/0.8 mg/ml: 7 days of medicine on hand        REFERRAL TO PHARMACIST     Referral to the pharmacist: Not needed      [facility]     Shipping address confirmed in Epic.     Delivery Scheduled: Yes, Expected medication delivery date: 08/15/2021.     Medication will be delivered via UPS to the prescription address in Epic WAM.    Shelvy Heckert D Litsy Epting   [facility] Specialty Technician

## 2021-08-14 DIAGNOSIS — E039 Hypothyroidism, unspecified: Secondary | ICD-10-CM | POA: Diagnosis not present

## 2021-08-14 DIAGNOSIS — E7800 Pure hypercholesterolemia, unspecified: Secondary | ICD-10-CM | POA: Diagnosis not present

## 2021-08-14 DIAGNOSIS — Z7289 Other problems related to lifestyle: Secondary | ICD-10-CM | POA: Diagnosis not present

## 2021-08-14 DIAGNOSIS — Z79899 Other long term (current) drug therapy: Secondary | ICD-10-CM | POA: Diagnosis not present

## 2021-08-14 DIAGNOSIS — Z125 Encounter for screening for malignant neoplasm of prostate: Secondary | ICD-10-CM | POA: Diagnosis not present

## 2021-08-14 DIAGNOSIS — Z131 Encounter for screening for diabetes mellitus: Secondary | ICD-10-CM | POA: Diagnosis not present

## 2021-08-14 DIAGNOSIS — R5383 Other fatigue: Secondary | ICD-10-CM | POA: Diagnosis not present

## 2021-08-14 DIAGNOSIS — R42 Dizziness and giddiness: Secondary | ICD-10-CM | POA: Diagnosis not present

## 2021-08-14 MED FILL — HUMIRA(CF) PEN 80 MG/0.8 ML SUBCUTANEOUS KIT: 28 days supply | Qty: 4 | Fill #2

## 2021-08-15 ENCOUNTER — Other Ambulatory Visit (HOSPITAL_COMMUNITY): Payer: Self-pay

## 2021-08-15 ENCOUNTER — Ambulatory Visit: Payer: Medicaid Other | Admitting: Neurology

## 2021-08-15 DIAGNOSIS — G4733 Obstructive sleep apnea (adult) (pediatric): Secondary | ICD-10-CM | POA: Diagnosis not present

## 2021-08-15 DIAGNOSIS — G4752 REM sleep behavior disorder: Secondary | ICD-10-CM

## 2021-08-15 DIAGNOSIS — R519 Headache, unspecified: Secondary | ICD-10-CM

## 2021-08-15 DIAGNOSIS — R0683 Snoring: Secondary | ICD-10-CM

## 2021-08-16 ENCOUNTER — Telehealth: Payer: Self-pay

## 2021-08-16 NOTE — Telephone Encounter (Signed)
[facility] Patient Advocate Encounter  Patient's medication [name] have been couriered to [facility] from [facility] and will be administered on the patient next office visit on 08/21/21.  [name] , [place] [facility] Patient [facility] Phone: [phone] Fax:  [phone]

## 2021-08-20 DIAGNOSIS — N183 Chronic kidney disease, stage 3 unspecified: Secondary | ICD-10-CM | POA: Diagnosis not present

## 2021-08-21 ENCOUNTER — Ambulatory Visit (INDEPENDENT_AMBULATORY_CARE_PROVIDER_SITE_OTHER): Payer: Medicaid Other | Admitting: Pharmacist

## 2021-08-21 ENCOUNTER — Other Ambulatory Visit: Payer: Self-pay

## 2021-08-21 DIAGNOSIS — Z111 Encounter for screening for respiratory tuberculosis: Secondary | ICD-10-CM

## 2021-08-21 DIAGNOSIS — B20 Human immunodeficiency virus [HIV] disease: Secondary | ICD-10-CM | POA: Diagnosis present

## 2021-08-21 DIAGNOSIS — Z23 Encounter for immunization: Secondary | ICD-10-CM | POA: Diagnosis not present

## 2021-08-21 MED ORDER — CABOTEGRAVIR & RILPIVIRINE ER 600 & 900 MG/3ML IM SUER
1.0000 | Freq: Once | INTRAMUSCULAR | Status: AC
  Administered 2021-08-21: 1 via INTRAMUSCULAR

## 2021-08-21 NOTE — Progress Notes (Signed)
HPI: Allen Espinoza is a 61 y.o. male who presents to the [facility] clinic for [place] administration.  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   CLONAZEPAM (KLONOPIN) 0.5 MG TABLET    Take 2 tablets (1 mg total) by mouth at bedtime. Follow titration instructions provided separately in writing.   FUROSEMIDE (LASIX) 20 MG TABLET    Take 1 tablet (20 mg total) by mouth daily.   GABAPENTIN (NEURONTIN) 100 MG CAPSULE    Take 1 capsule (100 mg total) by mouth 3 (three) times daily.   HUMIRA PEN 80 MG/0.8ML PNKT    Inject 80 mg into the skin once a week.   METRONIDAZOLE (FLAGYL) 250 MG TABLET    Take 1 tablet (250 mg total) by mouth 3 (three) times daily.   OLMESARTAN (BENICAR) 20 MG TABLET    Take 1 tablet by mouth once daily   RIVAROXABAN (XARELTO) 20 MG TABS TABLET    TAKE 1 TABLET BY MOUTH ONCE DAILY WITH SUPPER   SEMAGLUTIDE,0.25 OR 0.5MG/DOS, (OZEMPIC, 0.25 OR 0.5 MG/DOSE,) 2 MG/1.5ML SOPN    Inject 0.5 mg into the skin once a  week.  Modified Medications   No medications on file  Discontinued Medications   No medications on file    Allergies: Allergies  Allergen Reactions   Sulfa Antibiotics Other (See Comments)    Low blood pressure   Vancomycin Itching    Give with benadryl    Past Medical History: Past Medical History:  Diagnosis Date   Abscess    Between legs   Arthritis    all over   Asthma    Avascular necrosis of femoral head, left (HCC)    Blood transfusion without reported diagnosis    Cholelithiasis    CKD (chronic kidney disease) stage 3, GFR 30-59 ml/min (HCC)    sees kidney Dr.   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[place])    left DVT   Diverticulosis    DVT (deep venous thrombosis) (HCC)    legs   Dyspnea    when walking   Dysrhythmia    remembers mother taking about having an irregular rhythm years when he was a child    GERD (gastroesophageal reflux disease)    HIV (human immunodeficiency virus infection) ([place])    Hypertension    Morbid obesity ([place])     Social History: Social History   Socioeconomic History   Marital status: Single    Spouse name: Not on file   Number of children: 2  Years of education: Not on file   Highest education level: Not on file  Occupational History   Occupation: Disability  Tobacco Use   Smoking status: Never   Smokeless tobacco: Never  Vaping Use   Vaping Use: Never used  Substance and Sexual Activity   Alcohol use: No    Comment: prior   Drug use: No    Comment: prior cocaine use, last 2005   Sexual activity: Not Currently    Partners: Female    Birth control/protection: Condom    Comment: declined condoms 05/2020  Other Topics Concern   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, mailroom, warehouse work   Lives w/ fiancee   No EtOH, tobacco, drugs   Social Determinants of Health   Financial Resource Strain: Not on file  Food Insecurity: Not on file  Transportation Needs: Not on file  Physical  Activity: Not on file  Stress: Not on file  Social Connections: Not on file    Labs: Lab Results  Component Value Date   HIV1RNAQUANT <20 (H) 06/21/2021   HIV1RNAQUANT Not Detected 02/13/2021   HIV1RNAQUANT Not Detected 12/14/2020   HIV1RNAVL 47,300 03/18/2016   CD4TABS 441 06/21/2021   CD4TABS 440 08/17/2020   CD4TABS 345 (L) 02/01/2020    RPR and STI Lab Results  Component Value Date   LABRPR NON-REACTIVE 06/21/2021   LABRPR NON-REACTIVE 02/01/2020   LABRPR NON-REACTIVE 08/18/2018   LABRPR NON-REACTIVE 05/01/2017   LABRPR NON REAC 08/07/2016    STI Results GC CT  02/13/2021  9:52 AM Negative   Negative    02/01/2020 11:01 AM Negative   Negative    03/03/2019 12:00 AM Negative  C Negative  C  05/01/2017 12:00 AM Negative   Negative    08/07/2016 12:00 AM Negative   Negative    04/26/2016 12:00 AM Negative   Negative      C Corrected result    Hepatitis B Lab Results  Component Value Date   HEPBSAB REACTIVE (A) 11/19/2016   HEPBSAG NON-REACTIVE 11/19/2016   Hepatitis C No results found for: HEPCAB, HCVRNAPCRQN Hepatitis A No results found for: HAV Lipids: Lab Results  Component Value Date   CHOL 150 03/09/2021   TRIG 73 03/09/2021   HDL 65 03/09/2021   CHOLHDL 2.3 03/09/2021   VLDL 16 08/07/2016   LDLCALC 71 03/09/2021    TARGET DATE: 20th of the month  Assessment: Allen Espinoza presents today for their maintenance Cabenuva injections. Past injections were tolerated well without issues. No problems with systemic side effects of injections. Patient did experience minor soreness that is self resolving.   Administered cabotegravir 626m/3mL in right upper outer quadrant of the gluteal muscle. Administered rilpivirine 900 mg/311min the left upper outer quadrant of the gluteal muscle. Injections were tolerated well. Patient will follow up in 2 months for next set of injections. Patient has been well controlled and his HIV RNA has been spaced out to every other  visit; HIV RNA labs were not drawn today. He is also not sexually active with anyone new and politely declined STI testing. He also qualified for his Menveo vaccine today as he hasn't received any vaccines previously. His PCP requested a Tb test, so a quantiferon test was ordered.  Plan: - Cabenuva injections administered - Next injections scheduled for 7/20 with Dr. WoRogers Blocker Call with any issues or questions - Administered Menveo today (next injection due 09/2021) - F/u Quantiferon Gold test results  Varney Daily, PharmD PGY1 Pharmacy Resident [facility]

## 2021-08-21 NOTE — Addendum Note (Signed)
Addended by: [name] on: 08/21/2021 06:02 PM   Modules accepted: Orders

## 2021-08-21 NOTE — Procedures (Signed)
   [facility]  HOME SLEEP TEST (Watch PAT) REPORT  STUDY DATE: 08/15/2021  DOB: [birth year]  MRN: [id number]  ORDERING CLINICIAN: [name], MD, PhD   CLINICAL INFORMATION/HISTORY: 61 year old male with an underlying complex medical history of arthritis, avascular necrosis of the left femoral head, cholelithiasis, chronic kidney disease, diverticulosis, HIV, history of DVT, History of vivid dreams and RBD, and morbid obesity with BMI of over 40, who reports recurrent headaches.  BMI: 44.5 kg/m  FINDINGS:   Sleep Summary:   Total Recording Time (hours, min): 9 hours, 47 minutes  Total Sleep Time (hours, min):  7 hours, 25 minutes   Percent REM (%):    12.1%   Respiratory Indices:   Calculated pAHI (per hour):  19.1/hour         REM pAHI:    42.7/hour       NREM pAHI: 16.6/hour  Oxygen Saturation Statistics:    Oxygen Saturation (%) Mean: 95%   Minimum oxygen saturation (%):                 84%   O2 Saturation Range (%): 84-100%    O2 Saturation (minutes) <=88%: 0.1 min  Pulse Rate Statistics:   Pulse Mean (bpm):    52/min    Pulse Range (36-102/min)   IMPRESSION: OSA (obstructive sleep apnea)   RECOMMENDATION:  This home sleep test demonstrates moderate obstructive sleep apnea with a total AHI of 19.1/hour and O2 nadir of 84%.  Intermittent mild to moderate snoring was detected, at times in the louder range.  Treatment with positive airway pressure is recommended. The patient will be advised to proceed with an autoPAP titration/trial at home for now. A full night titration study may be considered to optimize treatment settings, if needed down the road.  Concomitant weight loss is highly recommended.  Please note that untreated obstructive sleep apnea may carry additional perioperative morbidity. Patients with significant obstructive sleep apnea should receive perioperative PAP therapy and the surgeons and particularly the anesthesiologist  should be informed of the diagnosis and the severity of the sleep disordered breathing. The patient should be cautioned not to drive, work at heights, or operate dangerous or heavy equipment when tired or sleepy. Review and reiteration of good sleep hygiene measures should be pursued with any patient. Other causes of the patient's symptoms, including circadian rhythm disturbances, an underlying mood disorder, medication effect and/or an underlying medical problem cannot be ruled out based on this test. Clinical correlation is recommended. The patient and his referring provider will be notified of the test results. The patient will be seen in follow up in sleep clinic at [facility].  I certify that I have reviewed the raw data recording prior to the issuance of this report in accordance with the standards of the American Academy of Sleep Medicine (AASM).  INTERPRETING PHYSICIAN:   [name], MD, PhD  Board Certified in Neurology and Sleep Medicine  [facility] [address], [name], [place] [address] [phone]

## 2021-08-22 ENCOUNTER — Telehealth: Payer: Self-pay | Admitting: *Deleted

## 2021-08-22 NOTE — Telephone Encounter (Signed)
Called pt and LVM (ok per DPR) with office number asking for call back to discuss his sleep study results.

## 2021-08-22 NOTE — Telephone Encounter (Signed)
-----   Message from [name], MD sent at 08/21/2021  6:01 PM EDT ----- Patient was recently seen for recurrent headaches.  We mutually agreed to pursue sleep testing again for new onset headaches.  He had a home sleep test on 08/15/2021 which indicated moderate obstructive sleep apnea and we had discussed the possibility of treating him with an AutoPap machine during the appointment.  Please call and notify the patient that the recent home sleep test showed obstructive sleep apnea in the moderate range. I recommend treatment in the form of autoPAP, which means, that we don't have to bring him in for a sleep study with CPAP, but will let him start using a so called autoPAP machine at home, which is a CPAP-like machine with self-adjusting pressures. We will send the order to a local DME company (of his choice, or as per insurance requirement). The DME representative will fit him with a mask, educate him on how to use the machine, how to put the mask on, etc. I have placed an order in the chart. Please send the order, talk to patient, send report to referring MD. We will need a FU in sleep clinic for 10 weeks post-PAP set up, please arrange that with me or one of our NPs. Also reinforce the need for compliance with treatment. Thanks,   [name], MD, PhD [facility])

## 2021-08-22 NOTE — Telephone Encounter (Signed)
Patient returned my call.  We discussed his sleep study results.  He understands he has moderate obstructive sleep apnea.  He is amenable to proceeding with an AutoPap machine.  We discussed the insurance compliance requirements which includes using the machine at least 4 hours at night and also being seen in the office here between 30 and 90 days after set up.  Patient's only preference for durable medical equipment company is a [place] location.  We have referred him to [facility].  He will watch for a call within 48 hours.  We will send a follow-up letter to his MyChart so he has a phone number if needed.  The patient's questions were answered.  He verbalized appreciation for the call.  Order, office note, sleep study result, insurance info faxed to [facility]. Received a receipt of confirmation. Letter sent to pt via mychart.

## 2021-08-23 ENCOUNTER — Ambulatory Visit: Payer: Medicaid Other | Admitting: Family Medicine

## 2021-08-25 LAB — QUANTIFERON-TB GOLD PLUS
Mitogen-NIL: 9.87 IU/mL
NIL: 0.03 IU/mL
QuantiFERON-TB Gold Plus: NEGATIVE
TB1-NIL: 0.03 IU/mL
TB2-NIL: 0.04 IU/mL

## 2021-09-04 NOTE — Unmapped (Signed)
Left messge in regards to cancelling apt on 7/3 due to Dr. Guy Sandifer being out of clinic. Asked patient to call back to reschedule for Thursday 7/13 at [facility].

## 2021-09-05 DIAGNOSIS — G4733 Obstructive sleep apnea (adult) (pediatric): Secondary | ICD-10-CM | POA: Diagnosis not present

## 2021-09-06 NOTE — Unmapped (Signed)
[facility] Refill Coordination Note    Specialty Medication(s) to be Shipped:   Inflammatory Disorders: Humira    Other medication(s) to be shipped: No additional medications requested for fill at this time     Dustin Prince, DOB: March 16, 1961  Phone: [phone] (home)       All above HIPAA information was verified with patient.     Was a Nurse, learning disability used for this call? No    Completed refill call assessment today to schedule patient's medication shipment from the [facility] [phone]).  All relevant notes have been reviewed.     Specialty medication(s) and dose(s) confirmed: Regimen is correct and unchanged.   Changes to medications: Patient is now using a CPAP machine for sleep apnea  Changes to insurance: No  New side effects reported not previously addressed with a pharmacist or physician: None reported  Questions for the pharmacist: No    Confirmed patient received a Conservation officer, historic buildings and a Surveyor, mining with first shipment. The patient will receive a drug information handout for each medication shipped and additional FDA Medication Guides as required.       DISEASE/MEDICATION-SPECIFIC INFORMATION        For patients on injectable medications: Patient currently has 1 doses left.  Next injection is scheduled for 09/11/2021.    SPECIALTY MEDICATION ADHERENCE     Medication Adherence    Patient reported X missed doses in the last month: 0  Specialty Medication: Humira  Patient is on additional specialty medications: No  Any gaps in refill history greater than 2 weeks in the last 3 months: no  Demonstrates understanding of importance of adherence: yes  Informant: patient  Reliability of informant: reliable  Support network for adherence: family member  Confirmed plan for next specialty medication refill: delivery by pharmacy  Refills needed for supportive medications: not needed              Were doses missed due to medication being on hold? No    Humira 80/0.8 mg/ml: 7 days of medicine on hand        REFERRAL TO PHARMACIST     Referral to the pharmacist: Not needed      [facility]     Shipping address confirmed in Epic.     Delivery Scheduled: Yes, Expected medication delivery date: 09/11/2021.     Medication will be delivered via UPS to the prescription address in Epic WAM.    Dustin Prince D Dustin Prince   [facility] Specialty Technician

## 2021-09-10 ENCOUNTER — Encounter: Payer: Self-pay | Admitting: Infectious Diseases

## 2021-09-10 MED FILL — HUMIRA(CF) PEN 80 MG/0.8 ML SUBCUTANEOUS KIT: 28 days supply | Qty: 4 | Fill #3

## 2021-09-10 NOTE — Telephone Encounter (Signed)
Resmed Airsense 11 Auto Setup 09/05/21 (appt needed 10/05/21 - 12/06/21)

## 2021-09-28 DIAGNOSIS — L732 Hidradenitis suppurativa: Principal | ICD-10-CM

## 2021-09-28 MED ORDER — HUMIRA(CF) PEN 80 MG/0.8 ML SUBCUTANEOUS KIT
11 refills | 0 days
Start: 2021-09-28 — End: ?

## 2021-10-05 DIAGNOSIS — G4733 Obstructive sleep apnea (adult) (pediatric): Secondary | ICD-10-CM | POA: Diagnosis not present

## 2021-10-05 DIAGNOSIS — L732 Hidradenitis suppurativa: Principal | ICD-10-CM

## 2021-10-05 MED ORDER — HUMIRA(CF) PEN 80 MG/0.8 ML SUBCUTANEOUS KIT
3 refills | 0 days | Status: CP
Start: 2021-10-05 — End: ?
  Filled 2021-10-11: qty 12, 84d supply, fill #0

## 2021-10-08 ENCOUNTER — Other Ambulatory Visit (HOSPITAL_COMMUNITY): Payer: Self-pay

## 2021-10-09 ENCOUNTER — Other Ambulatory Visit (HOSPITAL_COMMUNITY): Payer: Self-pay

## 2021-10-09 NOTE — Unmapped (Signed)
[facility] Refill Coordination Note    Specialty Medication(s) to be Shipped:   Inflammatory Disorders: Humira    Other medication(s) to be shipped: No additional medications requested for fill at this time     Dustin Prince, DOB: [birth year]  Phone: [phone] (home)       All above HIPAA information was verified with patient.     Was a Nurse, learning disability used for this call? No    Completed refill call assessment today to schedule patient's medication shipment from the [facility] [phone]).  All relevant notes have been reviewed.     Specialty medication(s) and dose(s) confirmed: Regimen is correct and unchanged.   Changes to medications: [place]eston reports no changes at this time.  Changes to insurance: No  New side effects reported not previously addressed with a pharmacist or physician: None reported  Questions for the pharmacist: No    Confirmed patient received a Conservation officer, historic buildings and a Surveyor, mining with first shipment. The patient will receive a drug information handout for each medication shipped and additional FDA Medication Guides as required.       DISEASE/MEDICATION-SPECIFIC INFORMATION        For patients on injectable medications: Patient currently has 1 doses left.  Next injection is scheduled for 10/09/2021.    SPECIALTY MEDICATION ADHERENCE     Medication Adherence    Patient reported X missed doses in the last month: 0  Specialty Medication: Humira  Patient is on additional specialty medications: No  Any gaps in refill history greater than 2 weeks in the last 3 months: no  Demonstrates understanding of importance of adherence: yes  Informant: patient  Reliability of informant: reliable  Support network for adherence: family member  Confirmed plan for next specialty medication refill: delivery by pharmacy  Refills needed for supportive medications: not needed              Were doses missed due to medication being on hold? No    Humira 80/0.8 mg/ml: 7 days of medicine on hand        REFERRAL TO PHARMACIST     Referral to the pharmacist: Not needed      [facility]     Shipping address confirmed in Epic.     Delivery Scheduled: Yes, Expected medication delivery date: 10/11/2021.     Medication will be delivered via UPS to the prescription address in Epic WAM.    Donasia Wimes D Allisa Einspahr   [facility] Specialty Technician

## 2021-10-10 NOTE — Unmapped (Signed)
Keo Diona Browner 's HUMIRA(CF) PEN 80 mg/0.8 mL Pnkt (adalimumab) shipment will be delayed as a result of insufficient inventory of the drug.     I have reached out to the patient  at [phone] and communicated the delivery change. We will reschedule the medication for the delivery date that the patient agreed upon.  We have confirmed the delivery date as 10/12/2021, via ups.

## 2021-10-10 NOTE — Unmapped (Unsigned)
ASSESSMENT:    DISCUSSION:    PLAN:    ----    REFERRING PROVIDER: [name]*    HISTORY OF PRESENT ILLNESS:     A 61 y.o.-year-old seen in consultation at the request of the above provider Dr. Tammi Sou (Dermatology) for Hidradenitis Suppurativa      ***    I reviewed history elements and review of systems on new patient intake form.    PAST MEDICAL HISTORY:   Past Medical History:   Diagnosis Date    Chronic kidney disease     Disorder of skin or subcutaneous tissue        PAST SURGICAL HISTORY:   Past Surgical History:   Procedure Laterality Date    JOINT REPLACEMENT      SKIN BIOPSY         MEDICATIONS:   Current Outpatient Medications   Medication Sig Dispense Refill    adalimumab (HUMIRA,CF, PEN) 80 mg/0.8 mL PnKt Inject the contents of 1 pen (80 mg total) under the skin every seven (7) days. 12 each 3    albuterol HFA 90 mcg/actuation inhaler Inhale 2 puffs.      bictegrav-emtricit-tenofov ala (BIKTARVY) 50-200-25 mg tablet Take 1 tablet by mouth daily.      CLONAZEPAM ORAL Take by mouth.      cyclobenzaprine (FLEXERIL) 5 MG tablet TAKE 1 TABLET BY MOUTH AT BEDTIME      furosemide (LASIX) 20 MG tablet Take 20 mg by mouth daily.      HYDROcodone-acetaminophen (NORCO) 5-325 mg per tablet Take 1-2 tablets every 6 hours as needed for pain 15 tablet 0    losartan (COZAAR) 50 MG tablet       metroNIDAZOLE (FLAGYL) 250 MG tablet TAKE 1 TABLET BY MOUTH THREE TIMES DAILY 90 tablet 0    pantoprazole (PROTONIX) 40 MG tablet Take 40 mg by mouth daily.      rivaroxaban (XARELTO) 20 mg tablet Take 20 mg by mouth daily with evening meal.       No current facility-administered medications for this visit.       ALLERGIES:   Allergies   Allergen Reactions    Sulfasalazine      Other reaction(s): Other (See Comments)  Low blood pressure    Vancomycin Itching     Give with benadryl       FAMILY HISTORY:   Family History   Problem Relation Age of Onset    Melanoma Neg Hx     Squamous cell carcinoma Neg Hx Basal cell carcinoma Neg Hx     Cancer Neg Hx        SOCIAL HISTORY:   Social History     Tobacco Use    Smoking status: Never    Smokeless tobacco: Never   Other Topics Concern    Do you use sunscreen? No    Tanning bed use? No    Are you easily burned? No    Excessive sun exposure? No    Blistering sunburns? No       REVIEW OF SYSTEMS:   10-system review of systems negative other than what is mentioned above.  The patient was asked to review all abnormal responses not pertinent to today's visit with their primary care physician.    PHYSICAL EXAM:   GENERAL: Pleasant male in no acute distress.   VITAL SIGNS: There were no vitals taken for this visit.  HEENT: Normocephalic, atraumatic, extraocular muscles intact  NECK: Supple, no lymphadenopathy  CARDIOVASCULAR: No peripheral edema  PULMONARY: Normal work of breathing, no use of accessory muscles  ABDOMEN: Soft, non-tender, non-distended. No organomegaly or hernias.  BACK: No costovertebral angle tenderness, no spiny bone tenderness.   EXTREMITIES: No clubbing, cyanosis or edema.   NEUROLOGIC:  Cranial nerves II-XII grossly intact  PSYCHOLOGIC: Normal affect, normal mood  SKIN: Warm and dry. No lesions.  GU: ***    LAB RESULTS:   Results for orders placed or performed in visit on 11/30/18   Dermatopathology Order   Result Value Ref Range    Case Report       Surgical Pathology Report                         Case: [id number]                                 Authorizing Provider:  [name], MD Collected:           11/30/2018 1228              Ordering Location:     [facility]            Received:            12/01/2018 0725                                     [place]                                                                 Pathologist:           Abbie Sons, MD                                                         Specimen:    Skin, Pubic area, excision                                                                 Diagnosis Pubic area, excision  -Cystic sinus tract with deep dense mixed inflammation, granulation tissue, surrounding scar, pseudoepitheliomatous hyperplasia consistent with hidradenitis suppurativa.        Clinical History       HS        Gross Description       The specimen was received labeled with the patient???s name and measured 29 x 23 x 9 mm. The tissue is serially sectioned and representative sections are submitted as block(s) A1. Tissue remains in formalin.        Microscopic Description       Light microscopy substantiates the above diagnosis.         EMBEDDED IMAGES  Ordered at this visit: No orders of the defined types were placed in this encounter.      No results found for: PSASCRN, PSADIAG    No results found for: WBC, HGB, HCT, PLT    No results found for: NA, K, CL, CO2, BUN, CREATININE, GLU, CALCIUM, MG, PHOS    No results found for: BILITOT, BILIDIR, PROT, ALBUMIN, ALT, AST, ALKPHOS, GGT    No results found for: LABPROT, INR, APTT

## 2021-10-11 ENCOUNTER — Ambulatory Visit: Admit: 2021-10-11 | Discharge: 2021-10-12 | Payer: BLUE CROSS/BLUE SHIELD | Attending: Urology | Primary: Urology

## 2021-10-11 ENCOUNTER — Telehealth: Payer: Self-pay

## 2021-10-11 DIAGNOSIS — L732 Hidradenitis suppurativa: Secondary | ICD-10-CM | POA: Diagnosis not present

## 2021-10-11 NOTE — Telephone Encounter (Signed)
[facility] Patient Advocate Encounter  Patient's medication [name] have been couriered to [facility] from [employer] and will be administered on the patient next office visit on 10/18/21.  [name] , [facility] Patient [facility] Phone: [phone] Fax:  [phone]

## 2021-10-11 NOTE — Unmapped (Signed)
Overview    Hidradenitis Suppurativa (HS) is a chronic inflammatory skin condition that involves hair follicles. It primarily affects regions of skin folds such as the axillae (armpits), groin, perianal, perineal, and inframammary (under breast) regions. The symptoms vary, from recurrent inflamed nodules and abscesses to draining sinus tracts and severe scar formation.     The typical age of onset of HS is early adulthood. Interventions for HS target one or more of three major goals:   1) To reduce the frequency of new lesions, minimizing pain and suppuration  2) To prevent disease progression by limiting the formation of scarring  3) To treat existing lesions and scarring, a goal which may require a combination of medical and surgical intervention    More than 50 interventions exist for treatment of HS, including patient self-management strategies, topical therapy, oral systemic agents, biologic therapies, surgery, and laser and light interventions. Disease severity, patient tolerance of specific agents, comorbidities, and treatment cost and availability guide treatment choices.     Wide surgical excision may be needed to manage an area of chronic or extensive Hidradenitis Suppurativa. Surgery along with aggressive medical management often is effective in controlling the disease, however it is NOT curative. This usually requires general anesthesia to ensure the best results.     The goal of surgery is to remove the inflammatory, damaged, or scarred tissue and leaving behind healthy soft tissue. The best method of skin closure depends on the size of the excision:  - Your surgeon may be able to close the skin completely. In this case, it would normal to have some drainage from the incisions.   - Patients may continue to have some mild mucopurulent discharge, either from incompletely excised HS, or from HS lesions near the surgical site. This is not a surgical site infection.  - There may be parts or all of the excision area that has to be left open to heal from the inside out. These wounds usually require daily packing changes or wound vacuum device placement that get changed three times per week. These may take weeks-months to heal.   - In cases requiring more extensive surgical excision, closure of the skin defect with advancement flaps or split-thickness skin grafting may be required.     RISKS OF SURGERY    Risks include those related to anesthesia (including pneumonia, heart attack, stroke), bleeding, and infection.    More life-threatening complications include deep venous thrombosis (blood clots in the legs), pulmonary embolism, stroke, heart attack, bowel injury, major vascular injury, compartment syndrome or death.    Other risks associated with urethroplasty include ejaculatory dysfunction, pain, post-void dribbling, erectile dysfunction, recurrent stricture.     Other risks associated with buccal mucosa graft harvest include bleeding, perioral numbness, tightness or difficulty with mouth opening, damage to salivary ducts, and changes in salivation. Most of these will improve by 3 months after surgery, with rare long-term complications including perioral numbness, persistent difficulty with mouth opening or change in salivary function.    COVID-19 Pandemic  There are additional risks related to undergoing a procedure during the COVID19 pandemic, including:      - The lack of information on the true risks during this pandemic.      - The uncertain (but likely increased) risk of nosocomial infection with COVID-19 given the hospital environment and the inability to social distance during a procedure.      - The possible impact of pandemic-associated resource shortages and changes in hospital operations on the  postoperative care and experience.      - The possible complications from a COVID-19 infection.    In order to maintain a safe environment for all our patients and staff, patients undergoing scheduled surgery must have a negative COVID test within 3 days of surgery.  Importantly, your COVID test must be a PCR. These tests are better than antigen (rapid) tests, but take much longer to result (many days vs 1 hour or less).   We strongly recommend that you get your COVID test at [facility]. If you have a COVID test at [facility], your surgery scheduler will arrange for the test to be done 2-3 days before surgery, and you can be assured that the test result will be back before surgery.  If you get a COVID PCR test at an outside facility, there is a chance that it will not result in time and your surgery will be cancelled or delayed.  If you only have a COVID rapid test, your surgery will be cancelled.      BEFORE SURGERY    Scheduling your surgery  Your surgery scheduler will call you to schedule your surgery within one week.  If you do not receive a phone call after one week, please call our clinic at [phone]    Pre-operative appointment  After meeting with Dr. Guy Sandifer and determining that surgery is appropriate, most patients will return for a pre-operative appointment with a member of the urology team.  Goals of this appointment are to review your medical history, discuss the surgery/recovery, and obtain blood and urine tests. Typically, you will sign consent for surgery at this time.    Pre-care appointment  Typically scheduled on the same day as your pre-operative appointment, the goal of this appointment is to meet with a member of the anesthesia team to evaluate your fitness for surgery and discuss the anesthesia that will be used on the day of surgery. You will also be told at this visit which medications to take on the day of surgery.    Medical and/or cardiac clearance  If we are unsure about your fitness for surgery, we will request that your primary care physician or cardiologist assist [place] in making this evaluation. If requested, please make appointments with these physicians as soon as possible so your surgery is not delayed.    Benefits Investigation and out of pocket cost  - While insurance plans typically cover surgery for HS, you should check with your insurer to confirm this.  - If you would like a cost estimate, you can reach our financial counselor, Aura Camps, at [phone].    Blood thinners  - The following medications will need to be stopped pre-operatively (with approval from prescriber): Aspirin 325mg , Plavix, Ticlid, Coumadin, Brilinta, Pradaxa, Xarelto  - If you take Aspirin 81 mg for heart health, you should stay on this medication before and after surgery.  - If you take non-steroidal anti-inflammatory medications such as Motrin, Ibuprofen, Aleve or Celebrex you can continue these before and after your surgery.    Day of surgery  These surgeries are typically performed at:  [facility]  [address]  [place], [place] [address]  [phone]    - Bonita Quin will receive a call the business day prior to your surgery telling you when to arrive.  - Patients are often discharged the same day, but you should be prepared to stay overnight if necessary.  - You should arrange for a ride home after your surgery since  you will likely be on medications that would make driving unsafe.     During surgery  - Surgery is done in an operating room at the hospital.  - You will be put fully to sleep under general anesthesia for the procedure.   - You will receive IV antibiotics immediately before surgery to help prevent infection.   - The surgery site will be shaved immediately before surgery (after you are asleep).  - Incision or multiple incisions will be made at the surgeon's discretion to treat the most affected areas of HS as possible. This is also discussed in the preoperative holding area.   - Surgery typically takes 1-3 hours, though this depends on the extent of surgery you are having.    Recovery  - You will be woken up in the operating room, but most people do not remember anything until they are in the recovery area.   - Your surgical dressings will vary. If you have surgical drains, your nurse will show you how to take care of them.  - You typically will NOT have a urethral catheter and we will monitor you to make sure you can pee on your own within 6 hours after surgery.   - You will discuss with your surgeon before surgery, but typically this requires at least 1 night in the hospital to monitor your wounds and teach you how to care for them. If you need help or are unable to care for them at home, we will consult a case manager who will help you navigate options for home health or a temporary rehab facility.       At home  - Please follow the wound care instructions as closely as possible to ensure the best possible outcome.   - No driving while on pain medications and for 24 hours after surgery.   - Walking is encouraged to prevent blood clots from forming in your legs.  - You may shower, but do not immerse your wounds for 2 weeks.  Wash the surgical site with mild soap and water, but do not scrub vigorously. Pat the incisions dry gently. If possible, wait 10-15 minutes after showering to put on underwear and even use a hairdryer on cool setting to help dry your incision sites.   - Minor swelling and bruising is expected and should resolve in 2-3 weeks.  - No sexual activity until cleared by Dr. Guy Sandifer (~4 weeks)  - No deep squats for 6 weeks  - [facility] for fever > 101.5 by mouth, severe or worsening pain, problems with your incision such as persistent drainage, separation, drainage of pus, worsening redness, or leg pain/numbness/weakness/swelling.   - A small amount of drainage is normal, but if you are concerned, if the drainage is increasing, or if there is new separation of your wounds, call [facility] to schedule the next available clinic appointment with Dr. Guy Sandifer.   - If you have separation of your wounds, you should have extra supplies on hand including gloves, Kerlex cause, saline irrigation, abdominal pads, and gentle skin tape.     Pain control  - Pain after surgery is variable; most patients have mild pain that resolves within 7-10 days of surgery  - For optimal pain control, we recommend:  1) Ice to the areas around the incision for 2 days   2) Scheduled NSAIDs (ibuprofen, Motrin, Advil) for 5 days after the surgery, unless you have been told to avoid NSAIDs. The standard maximum dose of  Ibuprofen is 1200mg /day (e.g., 400mg  every 8 hours). These medications should be taken with food and plenty of water.   3) Tylenol for breakthrough pain, unless you have been told to avoid Tylenol. The standard maximum dose of Tylenol is 3,600 mg/day and should not be exceeded.  4) If you have breakthrough pain despite the measures mentioned above, you can take oxycodone.     Contact us  During regular hours, call [phone] (or [phone]).   After hours or on weekends (emergencies only), call [phone].    Follow-up  We will contact you to schedule a follow up with Dr. Ericka Pontiff clinic. If you do not receive a call to schedule the follow-up appointment in 3-5 business days after surgery, call the Urology clinic at [phone].

## 2021-10-11 NOTE — Unmapped (Signed)
ASSESSMENT:  Hidradenitis, [place] Stage 3. Most bothered by posterior scrotum/perineum.  HIV, well controlled  History of DVT, on Xarelto    DISCUSSION:    We had a long discussion about etiology, natural history and management options for hidradenitis. This is a chronic disease, and we not going to cure it with one surgery. However, we can identify the areas that are most bothersome and will treat those. Surgical techniques include incision and drainage of abscess, unroofing of fistula tracts, and resection of diseased areas. Following resection, reconstruction options include primary closure, tissue transfer with skin flaps or split thickness skin grafts (staged), and healing by secondary intention.    More information:  [email or web address]    PLAN:  Excise posterior scrotum, perineum, possible unroofing of hidradenitis tracts  Will need clearance to come off Xarelto prior to scheduling - message sent via MyChart    Surgery Scheduling Checklist    Surgery (to be used for verify informed consent): Excise posterior scrotum, perineum, possible unroofing of hidradenitis tracts  Date of surgery: Next available  Case request ordered: No. Will do that once we have clearance to come off Xarelto.    Pre-op: No  Pre-care (request for pre anesthesia testing): No  Blood thinners: Rivaroxaban (Xarelto)  Labwork: CBC, BMP, HIVq in POHA  Admission status: Extended recovery  Other pre-op needs: None  Case length: 150 with turnover  Priority level: D. Semi-elective    All risks, benefits and alternatives were discussed.  The patient has had an opportunity to ask questions, and these questions were answered to satisfaction.  The patient expressed understanding.      ----    REFERRING PROVIDER: [name]*    HISTORY OF PRESENT ILLNESS:     A 61 y.o.-year-old seen in consultation at the request of the above provider for hidradenitis suppuritiva..    Diagnosed 3-4 years ago.  Has had surgery under left arm 2 years ago to have areas removed by Dr. Magnus Ivan. Also had scrotal lesions lanced in ED over the years.   On Flagyl and Humira x 3 years which has been keeping areas calm.  Followed by Dr. Janyth Contes currently. Tried intralesional tx but couldn't tolerate.  Draining a lot and smelling which is bothersome.    CKD 3 - Cr 1.44 06/21/21  HTN   HIV 2006/2007, undetectable.  On Xarelto for hx of DVT - 2007/08.   - Burton Apley - PCP -  Nathaneil Canary hx - brother with prostate cancer on medicine.     Sayed 07/02/21.  HS scrotal, inguinal and perineal, improved on Humira but with flare after stopping metronidazole.  HIV well controlled. RNAq 06/21/21 < 20.  Thighs    I reviewed history elements and review of systems on new patient intake form.    PAST MEDICAL HISTORY:   Past Medical History:   Diagnosis Date    Chronic kidney disease     Disorder of skin or subcutaneous tissue    Hypertension  HIV, diagnosed in 2006/2007. Levels undetectable.  LLE Blood clots on Xarelto.    PAST SURGICAL HISTORY:   Past Surgical History:   Procedure Laterality Date    JOINT REPLACEMENT      SKIN BIOPSY         MEDICATIONS:   Current Outpatient Medications   Medication Sig Dispense Refill    adalimumab (HUMIRA,CF, PEN) 80 mg/0.8 mL PnKt Inject the contents of 1 pen (80 mg total) under the skin every seven (7)  days. 12 each 3    albuterol HFA 90 mcg/actuation inhaler Inhale 2 puffs.      bictegrav-emtricit-tenofov ala (BIKTARVY) 50-200-25 mg tablet Take 1 tablet by mouth daily.      CLONAZEPAM ORAL Take by mouth.      cyclobenzaprine (FLEXERIL) 5 MG tablet TAKE 1 TABLET BY MOUTH AT BEDTIME      furosemide (LASIX) 20 MG tablet Take 20 mg by mouth daily.      HYDROcodone-acetaminophen (NORCO) 5-325 mg per tablet Take 1-2 tablets every 6 hours as needed for pain 15 tablet 0    losartan (COZAAR) 50 MG tablet       metroNIDAZOLE (FLAGYL) 250 MG tablet TAKE 1 TABLET BY MOUTH THREE TIMES DAILY 90 tablet 0    pantoprazole (PROTONIX) 40 MG tablet Take 40 mg by mouth daily.      rivaroxaban (XARELTO) 20 mg tablet Take 20 mg by mouth daily with evening meal.       No current facility-administered medications for this visit.       ALLERGIES:   Allergies   Allergen Reactions    Sulfasalazine      Other reaction(s): Other (See Comments)  Low blood pressure    Vancomycin Itching     Give with benadryl       FAMILY HISTORY:   Family History   Problem Relation Age of Onset    Melanoma Neg Hx     Squamous cell carcinoma Neg Hx     Basal cell carcinoma Neg Hx     Cancer Neg Hx    Family hx of prostate cancer    SOCIAL HISTORY:   Social History     Tobacco Use    Smoking status: Never    Smokeless tobacco: Never   Other Topics Concern    Do you use sunscreen? No    Tanning bed use? No    Are you easily burned? No    Excessive sun exposure? No    Blistering sunburns? No       REVIEW OF SYSTEMS:   10-system review of systems negative other than what is mentioned above.  The patient was asked to review all abnormal responses not pertinent to today's visit with their primary care physician.    PHYSICAL EXAM:   GENERAL: Pleasant male in no acute distress.   VITAL SIGNS: Blood pressure 165/72, pulse (!) 44, weight (!) 149.2 kg (329 lb).  HEENT: Normocephalic, atraumatic, extraocular muscles intact  NECK: Supple, no lymphadenopathy  CARDIOVASCULAR: No peripheral edema  PULMONARY: Normal work of breathing, no use of accessory muscles  ABDOMEN: Soft, non-tender, non-distended. No organomegaly or   EXTREMITIES: No clubbing, cyanosis or edema.   NEUROLOGIC:  Cranial nerves II-XII grossly intact  PSYCHOLOGIC: Normal affect, normal mood  SKIN: Warm and dry. No lesions.  GU:   See photos  Circ phallus  Scrotum nl anteriorly  Fistula tracts, possible fluctuance poster scrotum and perineum  Otherwise nl        LAB RESULTS:   Results for orders placed or performed in visit on 11/30/18   Dermatopathology Order Result Value Ref Range    Case Report       Surgical Pathology Report                         Case: [id number]  Authorizing Provider:  [name], MD Collected:           11/30/2018 1228              Ordering Location:     [facility]            Received:            12/01/2018 0725                                     [place]                                                                 Pathologist:           Abbie Sons, MD                                                         Specimen:    Skin, Pubic area, excision                                                                 Diagnosis       Pubic area, excision  -Cystic sinus tract with deep dense mixed inflammation, granulation tissue, surrounding scar, pseudoepitheliomatous hyperplasia consistent with hidradenitis suppurativa.        Clinical History       HS        Gross Description       The specimen was received labeled with the patient???s name and measured 29 x 23 x 9 mm. The tissue is serially sectioned and representative sections are submitted as block(s) A1. Tissue remains in formalin.        Microscopic Description       Light microscopy substantiates the above diagnosis.         EMBEDDED IMAGES       Ordered at this visit: No orders of the defined types were placed in this encounter.      No results found for: PSASCRN, PSADIAG    No results found for: WBC, HGB, HCT, PLT    No results found for: NA, K, CL, CO2, BUN, CREATININE, GLU, CALCIUM, MG, PHOS    No results found for: BILITOT, BILIDIR, PROT, ALBUMIN, ALT, AST, ALKPHOS, GGT    No results found for: LABPROT, INR, APTT

## 2021-10-18 ENCOUNTER — Other Ambulatory Visit: Payer: Self-pay

## 2021-10-18 ENCOUNTER — Ambulatory Visit (INDEPENDENT_AMBULATORY_CARE_PROVIDER_SITE_OTHER): Payer: Medicaid Other | Admitting: Pharmacist

## 2021-10-18 DIAGNOSIS — B20 Human immunodeficiency virus [HIV] disease: Secondary | ICD-10-CM

## 2021-10-18 MED ORDER — CABOTEGRAVIR & RILPIVIRINE ER 600 & 900 MG/3ML IM SUER
1.0000 | Freq: Once | INTRAMUSCULAR | Status: AC
  Administered 2021-10-18: 1 via INTRAMUSCULAR

## 2021-10-18 NOTE — Progress Notes (Signed)
HPI: Allen Espinoza is a 61 y.o. male who presents to the [facility] for [place] administration.  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 ([place]) 08/19/2016   Class 3 severe obesity due to excess calories with body mass index (BMI) of 45.0 to 49.9 in adult ([place]) 08/19/2016   Solitary pulmonary nodule 05/06/2016   Hidradenitis suppurativa 04/27/2016   Chest pain 04/26/2016   Avascular necrosis of bone of hip, left s/p TRH ([place]) 03/19/2016   Human immunodeficiency virus (HIV) disease ([place]) 03/18/2016   Chronic kidney disease 03/18/2016   Cholelithiasis 03/18/2016   Diverticulosis of colon without hemorrhage 03/18/2016    Patient's Medications  New Prescriptions   No medications on file  Previous Medications   CABOTEGRAVIR & RILPIVIRINE ER (CABENUVA) 600 & 900 MG/3ML INJECTION    Inject 1 kit into the muscle every 8 (eight) weeks.   CLONAZEPAM (KLONOPIN) 0.5 MG TABLET    Take 2 tablets (1 mg total) by mouth at bedtime. Follow titration instructions provided separately in writing.   FUROSEMIDE (LASIX) 20 MG TABLET    Take 1 tablet (20 mg total) by mouth daily.   GABAPENTIN (NEURONTIN) 100 MG CAPSULE    Take 1 capsule (100 mg total) by mouth 3 (three) times daily.   HUMIRA PEN 80 MG/0.8ML PNKT    Inject 80 mg into the skin once a week.   METRONIDAZOLE (FLAGYL) 250 MG TABLET    Take 1 tablet (250 mg total) by mouth 3 (three) times daily.   OLMESARTAN (BENICAR) 20 MG TABLET    Take 1 tablet by mouth once daily   RIVAROXABAN (XARELTO) 20 MG TABS TABLET    TAKE 1 TABLET BY MOUTH ONCE DAILY WITH SUPPER   SEMAGLUTIDE,0.25 OR 0.5MG/DOS, (OZEMPIC, 0.25 OR 0.5 MG/DOSE,) 2 MG/1.5ML SOPN    Inject 0.5 mg into the skin once a  week.  Modified Medications   No medications on file  Discontinued Medications   No medications on file    Allergies: Allergies  Allergen Reactions   Sulfa Antibiotics Other (See Comments)    Low blood pressure   Vancomycin Itching    Give with benadryl    Past Medical History: Past Medical History:  Diagnosis Date   Abscess    Between legs   Arthritis    all over   Asthma    Avascular necrosis of femoral head, left (HCC)    Blood transfusion without reported diagnosis    Cholelithiasis    CKD (chronic kidney disease) stage 3, GFR 30-59 ml/min (HCC)    sees kidney Dr.   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[place])    left DVT   Diverticulosis    DVT (deep venous thrombosis) (HCC)    legs   Dyspnea    when walking   Dysrhythmia    remembers mother taking about having an irregular rhythm years when he was a child    GERD (gastroesophageal reflux disease)    HIV (human immunodeficiency virus infection) ([place])    Hypertension    Morbid obesity ([place])     Social History: Social History   Socioeconomic History   Marital status: Single    Spouse name: Not on file   Number of children: 2  Years of education: Not on file   Highest education level: Not on file  Occupational History   Occupation: Disability  Tobacco Use   Smoking status: Never   Smokeless tobacco: Never  Vaping Use   Vaping Use: Never used  Substance and Sexual Activity   Alcohol use: No    Comment: prior   Drug use: No    Comment: prior cocaine use, last 2005   Sexual activity: Not Currently    Partners: Female    Birth control/protection: Condom    Comment: declined condoms 05/2020  Other Topics Concern   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, [place], warehouse work   Lives w/ fiancee   No EtOH, tobacco, drugs   Social Determinants of Health   Financial Resource Strain: High Risk (12/10/2017)   Overall Financial Resource Strain (CARDIA)    Difficulty of Paying  Living Expenses: Hard  Food Insecurity: Food Insecurity Present (12/10/2017)   Hunger Vital Sign    Worried About [place] in the Last Year: Sometimes true    Ran Out of Food in the Last Year: Sometimes true  Transportation Needs: No Transportation Needs (12/10/2017)   PRAPARE - Hydrologist (Medical): No    Lack of Transportation (Non-Medical): No  Physical Activity: Insufficiently Active (12/10/2017)   Exercise Vital Sign    Days of Exercise per Week: 2 days    Minutes of Exercise per Session: 10 min  Stress: No Stress Concern Present (12/10/2017)   [place]    Feeling of Stress : Only a little  Social Connections: Somewhat Isolated (12/10/2017)   Social Connection and Isolation Panel [NHANES]    Frequency of Communication with Friends and Family: Three times a week    Frequency of Social Gatherings with Friends and Family: More than three times a week    Attends Religious Services: Never    Marine scientist or Organizations: No    Attends Archivist Meetings: Never    Marital Status: Living with partner    Labs: Lab Results  Component Value Date   HIV1RNAQUANT <20 (H) 06/21/2021   HIV1RNAQUANT Not Detected 02/13/2021   HIV1RNAQUANT Not Detected 12/14/2020   HIV1RNAVL 47,300 03/18/2016   CD4TABS 441 06/21/2021   CD4TABS 440 08/17/2020   CD4TABS 345 (L) 02/01/2020    RPR and STI Lab Results  Component Value Date   LABRPR NON-REACTIVE 06/21/2021   LABRPR NON-REACTIVE 02/01/2020   LABRPR NON-REACTIVE 08/18/2018   LABRPR NON-REACTIVE 05/01/2017   LABRPR NON REAC 08/07/2016    STI Results GC CT  02/13/2021  9:52 AM Negative  Negative   02/01/2020 11:01 AM Negative  Negative   03/03/2019 12:00 AM Negative  C Negative  C  05/01/2017 12:00 AM Negative  Negative   08/07/2016 12:00 AM Negative  Negative   04/26/2016 12:00 AM Negative  Negative     C  Corrected result    Hepatitis B Lab Results  Component Value Date   HEPBSAB REACTIVE (A) 11/19/2016   HEPBSAG NON-REACTIVE 11/19/2016   Hepatitis C No results found for: "HEPCAB", "HCVRNAPCRQN" Hepatitis A No results found for: "HAV" Lipids: Lab Results  Component Value Date   CHOL 150 03/09/2021   TRIG 73 03/09/2021   HDL 65 03/09/2021   CHOLHDL 2.3 03/09/2021   VLDL 16 08/07/2016   LDLCALC 71 03/09/2021    TARGET DATE:  The 20th of the month  Current HIV Regimen: Cabenuva  Assessment: Ivie presents today for their maintenance Cabenuva injections. Initial/past injections were tolerated well without issues. No problems with systemic effects of injections. Administered cabotegravir 626m/3mL in left upper outer quadrant of the gluteal muscle. Administered rilpivirine 900 mg/327min the right upper outer quadrant of the gluteal muscle. Monitored patient for 10 minutes after injection. Injections were tolerated well without issue. Patient will follow up in 2 months for next injection. Will check HIV RNA today.   Plan: - Cabenuva injections administered - Next injections scheduled for 9/13 with GrMarya Amslernd 11/14 with me  - Check HIV RNA  - Call with any issues or questions  AmAlfonse SprucePharmD, CPP, BCBagleylinical Pharmacist Practitioner Infectious [place] Infectious Disease

## 2021-10-23 LAB — HIV-1 RNA QUANT-NO REFLEX-BLD
HIV 1 RNA Quant: NOT DETECTED Copies/mL
HIV-1 RNA Quant, Log: NOT DETECTED Log cps/mL

## 2021-10-24 ENCOUNTER — Encounter (HOSPITAL_COMMUNITY): Payer: Self-pay | Admitting: Emergency Medicine

## 2021-10-24 ENCOUNTER — Observation Stay (HOSPITAL_COMMUNITY): Payer: Medicaid Other

## 2021-10-24 ENCOUNTER — Emergency Department (HOSPITAL_COMMUNITY): Payer: Medicaid Other

## 2021-10-24 ENCOUNTER — Observation Stay (HOSPITAL_COMMUNITY)
Admission: EM | Admit: 2021-10-24 | Discharge: 2021-10-25 | Disposition: A | Discharge status: Home / Self Care | Payer: Medicaid Other | Attending: Neurological Surgery | Admitting: Neurological Surgery

## 2021-10-24 ENCOUNTER — Other Ambulatory Visit: Payer: Self-pay

## 2021-10-24 DIAGNOSIS — W109XXA Fall (on) (from) unspecified stairs and steps, initial encounter: Secondary | ICD-10-CM | POA: Insufficient documentation

## 2021-10-24 DIAGNOSIS — B20 Human immunodeficiency virus [HIV] disease: Secondary | ICD-10-CM | POA: Diagnosis not present

## 2021-10-24 DIAGNOSIS — Z20822 Contact with and (suspected) exposure to covid-19: Secondary | ICD-10-CM | POA: Insufficient documentation

## 2021-10-24 DIAGNOSIS — S065X0A Traumatic subdural hemorrhage without loss of consciousness, initial encounter: Secondary | ICD-10-CM | POA: Diagnosis not present

## 2021-10-24 DIAGNOSIS — S065X9A Traumatic subdural hemorrhage with loss of consciousness of unspecified duration, initial encounter: Secondary | ICD-10-CM | POA: Diagnosis not present

## 2021-10-24 DIAGNOSIS — Z96642 Presence of left artificial hip joint: Secondary | ICD-10-CM | POA: Diagnosis not present

## 2021-10-24 DIAGNOSIS — R590 Localized enlarged lymph nodes: Secondary | ICD-10-CM | POA: Diagnosis not present

## 2021-10-24 DIAGNOSIS — Z79899 Other long term (current) drug therapy: Secondary | ICD-10-CM | POA: Insufficient documentation

## 2021-10-24 DIAGNOSIS — Y9301 Activity, walking, marching and hiking: Secondary | ICD-10-CM | POA: Diagnosis not present

## 2021-10-24 DIAGNOSIS — R55 Syncope and collapse: Secondary | ICD-10-CM | POA: Diagnosis not present

## 2021-10-24 DIAGNOSIS — S065XAA Traumatic subdural hemorrhage with loss of consciousness status unknown, initial encounter: Secondary | ICD-10-CM

## 2021-10-24 DIAGNOSIS — J45909 Unspecified asthma, uncomplicated: Secondary | ICD-10-CM | POA: Diagnosis not present

## 2021-10-24 DIAGNOSIS — N183 Chronic kidney disease, stage 3 unspecified: Secondary | ICD-10-CM | POA: Insufficient documentation

## 2021-10-24 DIAGNOSIS — S3991XA Unspecified injury of abdomen, initial encounter: Secondary | ICD-10-CM | POA: Diagnosis not present

## 2021-10-24 DIAGNOSIS — S0990XA Unspecified injury of head, initial encounter: Secondary | ICD-10-CM | POA: Diagnosis not present

## 2021-10-24 DIAGNOSIS — I129 Hypertensive chronic kidney disease with stage 1 through stage 4 chronic kidney disease, or unspecified chronic kidney disease: Secondary | ICD-10-CM | POA: Diagnosis not present

## 2021-10-24 DIAGNOSIS — Z043 Encounter for examination and observation following other accident: Secondary | ICD-10-CM | POA: Diagnosis not present

## 2021-10-24 DIAGNOSIS — Z86718 Personal history of other venous thrombosis and embolism: Secondary | ICD-10-CM | POA: Diagnosis not present

## 2021-10-24 DIAGNOSIS — K8020 Calculus of gallbladder without cholecystitis without obstruction: Secondary | ICD-10-CM | POA: Diagnosis not present

## 2021-10-24 DIAGNOSIS — W19XXXA Unspecified fall, initial encounter: Secondary | ICD-10-CM | POA: Diagnosis not present

## 2021-10-24 DIAGNOSIS — I6200 Nontraumatic subdural hemorrhage, unspecified: Secondary | ICD-10-CM | POA: Diagnosis not present

## 2021-10-24 DIAGNOSIS — S4992XA Unspecified injury of left shoulder and upper arm, initial encounter: Secondary | ICD-10-CM | POA: Diagnosis not present

## 2021-10-24 DIAGNOSIS — S2242XA Multiple fractures of ribs, left side, initial encounter for closed fracture: Secondary | ICD-10-CM | POA: Diagnosis not present

## 2021-10-24 DIAGNOSIS — M4802 Spinal stenosis, cervical region: Secondary | ICD-10-CM | POA: Diagnosis not present

## 2021-10-24 DIAGNOSIS — R220 Localized swelling, mass and lump, head: Secondary | ICD-10-CM | POA: Diagnosis not present

## 2021-10-24 DIAGNOSIS — R001 Bradycardia, unspecified: Secondary | ICD-10-CM | POA: Diagnosis not present

## 2021-10-24 DIAGNOSIS — T1490XA Injury, unspecified, initial encounter: Secondary | ICD-10-CM | POA: Diagnosis not present

## 2021-10-24 DIAGNOSIS — Z7901 Long term (current) use of anticoagulants: Secondary | ICD-10-CM | POA: Diagnosis not present

## 2021-10-24 DIAGNOSIS — R918 Other nonspecific abnormal finding of lung field: Secondary | ICD-10-CM | POA: Diagnosis not present

## 2021-10-24 DIAGNOSIS — I700 Atherosclerosis of aorta: Secondary | ICD-10-CM | POA: Diagnosis not present

## 2021-10-24 DIAGNOSIS — I10 Essential (primary) hypertension: Secondary | ICD-10-CM | POA: Diagnosis not present

## 2021-10-24 LAB — COMPREHENSIVE METABOLIC PANEL
ALT: 13 U/L (ref 0–44)
AST: 19 U/L (ref 15–41)
Albumin: 3.2 g/dL — ABNORMAL LOW (ref 3.5–5.0)
Alkaline Phosphatase: 67 U/L (ref 38–126)
Anion gap: 7 (ref 5–15)
BUN: 16 mg/dL (ref 8–23)
CO2: 25 mmol/L (ref 22–32)
Calcium: 8.8 mg/dL — ABNORMAL LOW (ref 8.9–10.3)
Chloride: 108 mmol/L (ref 98–111)
Creatinine, Ser: 1.49 mg/dL — ABNORMAL HIGH (ref 0.61–1.24)
GFR, Estimated: 53 mL/min — ABNORMAL LOW (ref >60)
Glucose, Bld: 102 mg/dL — ABNORMAL HIGH (ref 70–99)
Potassium: 3.9 mmol/L (ref 3.5–5.1)
Sodium: 140 mmol/L (ref 135–145)
Total Bilirubin: 0.3 mg/dL (ref 0.3–1.2)
Total Protein: 7.8 g/dL (ref 6.5–8.1)

## 2021-10-24 LAB — URINALYSIS, ROUTINE W REFLEX MICROSCOPIC
Bilirubin Urine: NEGATIVE
Glucose, UA: NEGATIVE mg/dL
Ketones, ur: NEGATIVE mg/dL
Nitrite: NEGATIVE
Protein, ur: NEGATIVE mg/dL
Specific Gravity, Urine: 1.023 (ref 1.005–1.030)
pH: 5 (ref 5.0–8.0)

## 2021-10-24 LAB — CBC
HCT: 40.4 % (ref 39.0–52.0)
Hemoglobin: 14 g/dL (ref 13.0–17.0)
MCH: 34.8 pg — ABNORMAL HIGH (ref 26.0–34.0)
MCHC: 34.7 g/dL (ref 30.0–36.0)
MCV: 100.5 fL — ABNORMAL HIGH (ref 80.0–100.0)
Platelets: 313 K/uL (ref 150–400)
RBC: 4.02 MIL/uL — ABNORMAL LOW (ref 4.22–5.81)
RDW: 12.8 % (ref 11.5–15.5)
WBC: 6.7 K/uL (ref 4.0–10.5)
nRBC: 0 % (ref 0.0–0.2)

## 2021-10-24 LAB — I-STAT CHEM 8, ED
BUN: 24 mg/dL — ABNORMAL HIGH (ref 8–23)
Calcium, Ion: 1.12 mmol/L — ABNORMAL LOW (ref 1.15–1.40)
Chloride: 105 mmol/L (ref 98–111)
Creatinine, Ser: 1.4 mg/dL — ABNORMAL HIGH (ref 0.61–1.24)
Glucose, Bld: 108 mg/dL — ABNORMAL HIGH (ref 70–99)
HCT: 42 % (ref 39.0–52.0)
Hemoglobin: 14.3 g/dL (ref 13.0–17.0)
Potassium: 5.4 mmol/L — ABNORMAL HIGH (ref 3.5–5.1)
Sodium: 140 mmol/L (ref 135–145)
TCO2: 26 mmol/L (ref 22–32)

## 2021-10-24 LAB — LACTIC ACID, PLASMA: Lactic Acid, Venous: 1.3 mmol/L (ref 0.5–1.9)

## 2021-10-24 LAB — ETHANOL: Alcohol, Ethyl (B): <10 mg/dL

## 2021-10-24 LAB — PROTIME-INR
INR: 1.9 — ABNORMAL HIGH (ref 0.8–1.2)
Prothrombin Time: 21.3 seconds — ABNORMAL HIGH (ref 11.4–15.2)

## 2021-10-24 LAB — RESP PANEL BY RT-PCR (FLU A&B, COVID) ARPGX2
Influenza A by PCR: NEGATIVE
Influenza B by PCR: NEGATIVE
SARS Coronavirus 2 by RT PCR: NEGATIVE

## 2021-10-24 MED ORDER — TETANUS-DIPHTH-ACELL PERTUSSIS 5-2.5-18.5 LF-MCG/0.5 IM SUSY: 0.5000 mL | PREFILLED_SYRINGE | Freq: Once | INTRAMUSCULAR | Status: DC

## 2021-10-24 MED ORDER — CLONAZEPAM 0.5 MG PO TABS
0.5000 mg | ORAL_TABLET | Freq: Every day | ORAL | Status: DC
  Administered 2021-10-24: 0.5 mg via ORAL
  Filled 2021-10-24: qty 1

## 2021-10-24 MED ORDER — HYDROCODONE-ACETAMINOPHEN 5-325 MG PO TABS
1.0000 | ORAL_TABLET | ORAL | Status: DC | PRN
  Administered 2021-10-24: 1 via ORAL
  Filled 2021-10-24: qty 1

## 2021-10-24 MED ORDER — FUROSEMIDE 20 MG PO TABS
20.0000 mg | ORAL_TABLET | Freq: Every day | ORAL | Status: DC
  Administered 2021-10-25: 20 mg via ORAL
  Filled 2021-10-24: qty 1

## 2021-10-24 MED ORDER — ONDANSETRON HCL 4 MG PO TABS: 4.0000 mg | ORAL_TABLET | Freq: Four times a day (QID) | ORAL | Status: DC | PRN

## 2021-10-24 MED ORDER — METRONIDAZOLE 500 MG PO TABS
250.0000 mg | ORAL_TABLET | Freq: Three times a day (TID) | ORAL | Status: DC
  Administered 2021-10-24 – 2021-10-25 (×2): 250 mg via ORAL
  Filled 2021-10-24 (×2): qty 1

## 2021-10-24 MED ORDER — DEXAMETHASONE SODIUM PHOSPHATE 4 MG/ML IJ SOLN
4.0000 mg | Freq: Four times a day (QID) | INTRAMUSCULAR | Status: DC
  Administered 2021-10-24 – 2021-10-25 (×3): 4 mg via INTRAVENOUS
  Filled 2021-10-24 (×3): qty 1

## 2021-10-24 MED ORDER — ONDANSETRON HCL 4 MG/2ML IJ SOLN: 4.0000 mg | Freq: Four times a day (QID) | INTRAMUSCULAR | Status: DC | PRN

## 2021-10-24 MED ORDER — FENTANYL CITRATE PF 50 MCG/ML IJ SOSY
50.0000 ug | PREFILLED_SYRINGE | INTRAMUSCULAR | Status: DC | PRN
  Administered 2021-10-24 (×2): 50 ug via INTRAVENOUS
  Filled 2021-10-24 (×2): qty 1

## 2021-10-24 MED ORDER — ACETAMINOPHEN 650 MG RE SUPP: 650.0000 mg | Freq: Four times a day (QID) | RECTAL | Status: DC | PRN

## 2021-10-24 MED ORDER — ACETAMINOPHEN 325 MG PO TABS: 650.0000 mg | ORAL_TABLET | Freq: Four times a day (QID) | ORAL | Status: DC | PRN

## 2021-10-24 MED ORDER — GABAPENTIN 100 MG PO CAPS
100.0000 mg | ORAL_CAPSULE | Freq: Three times a day (TID) | ORAL | Status: DC
  Administered 2021-10-24 – 2021-10-25 (×2): 100 mg via ORAL
  Filled 2021-10-24 (×2): qty 1

## 2021-10-24 NOTE — ED Provider Notes (Signed)
[facility] EMERGENCY DEPARTMENT Provider Note   CSN: [id number] Arrival date & time: 10/24/21  0913     History  Chief Complaint  Patient presents with   [facility] AZAREL BANNER is a 61 y.o. male.  HPI Patient presents after a fall.  This fall occurred due to loss of balance.  At the time, he was walking down steps.  As he started walking down the steps, he lost his balance and fell to the bottom.  This was an estimated 18 status.  Patient had loss of consciousness during this fall.  When he came to, he was at the bottom of the steps.  He had pain in the left side of his head and posterior left shoulder.  He remained on the floor and ambulance was called.  He is on Xarelto and his last dose was last night.  EMS reports normal mentation on scene and during transit.  Patient was able to walk with assistance to the ambulance stretcher.  He endorses continued pain in the left side of his head and left posterior shoulder.  He denies any shortness of breath, chest pain, abdominal pain, or areas of weakness or numbness    Home Medications Prior to Admission medications   Medication Sig Start Date End Date Taking? Authorizing Provider  cabotegravir & rilpivirine ER (CABENUVA) 600 & 900 MG/3ML injection Inject 1 kit into the muscle every 8 (eight) weeks. 06/11/21 06/11/22 Yes Kuppelweiser, [name], RPH-CPP  clonazePAM (KLONOPIN) 0.5 MG tablet Take 2 tablets (1 mg total) by mouth at bedtime. Follow titration instructions provided separately in writing. Patient taking differently: Take 0.5 mg by mouth at bedtime. 11/01/20  Yes [name], MD  furosemide (LASIX) 20 MG tablet Take 1 tablet (20 mg total) by mouth daily. Patient taking differently: Take 40 mg by mouth daily. 04/09/21  Yes Delene Ruffini, MD  gabapentin (NEURONTIN) 100 MG capsule Take 1 capsule (100 mg total) by mouth 3 (three) times daily. 08/07/21  Yes [name], MD  HUMIRA PEN 80 MG/0.8ML PNKT Inject 80 mg into the  skin once a week. Tuesday's 08/08/20  Yes [provider]  metroNIDAZOLE (FLAGYL) 250 MG tablet Take 1 tablet (250 mg total) by mouth 3 (three) times daily. 05/11/21  Yes [name], MD  olmesartan [facility]) 20 MG tablet Take 1 tablet by mouth once daily 05/11/21  Yes [name], MD      Allergies    Sulfa antibiotics and Vancomycin    Review of Systems   Review of Systems  Musculoskeletal:  Positive for arthralgias.  Skin:  Positive for wound.  Neurological:  Positive for headaches.  All other systems reviewed and are negative.   Physical Exam Updated Vital Signs BP (!) 156/74   Pulse (!) 58   Temp 98.2 F (36.8 C) (Oral)   Resp 18   Ht 6' (1.829 m)   Wt (!) 149.2 kg   SpO2 99%   BMI 44.62 kg/m  Physical Exam Vitals and nursing note reviewed.  Constitutional:      General: He is not in acute distress.    Appearance: Normal appearance. He is well-developed. He is not ill-appearing, toxic-appearing or diaphoretic.  HENT:     Head: Normocephalic.     Right Ear: External ear normal.     Ears:     Comments: Laceration to left ear    Nose: Nose normal.     Mouth/Throat:     Mouth:  Mucous membranes are moist.     Pharynx: Oropharynx is clear.     Comments: No trismus, no malocclusion Eyes:     Extraocular Movements: Extraocular movements intact.     Conjunctiva/sclera: Conjunctivae normal.  Neck:     Comments: Cervical collar in place Cardiovascular:     Rate and Rhythm: Normal rate and regular rhythm.     Heart sounds: No murmur heard. Pulmonary:     Effort: Pulmonary effort is normal. No respiratory distress.     Breath sounds: Normal breath sounds. No wheezing or rales.  Chest:     Chest wall: No tenderness.  Abdominal:     Palpations: Abdomen is soft.     Tenderness: There is no abdominal tenderness.  Musculoskeletal:        General: No swelling, tenderness or deformity. Normal range of motion.     Cervical back: Neck supple. No  tenderness.  Skin:    General: Skin is warm and dry.     Coloration: Skin is not jaundiced or pale.  Neurological:     General: No focal deficit present.     Mental Status: He is alert and oriented to person, place, and time.     Cranial Nerves: No cranial nerve deficit.     Sensory: No sensory deficit.     Motor: No weakness.     Coordination: Coordination normal.  Psychiatric:        Mood and Affect: Mood normal.        Behavior: Behavior normal.        Thought Content: Thought content normal.        Judgment: Judgment normal.     ED Results / Procedures / Treatments   Labs (all labs ordered are listed, but only abnormal results are displayed) Labs Reviewed  CBC - Abnormal; Notable for the following components:      Result Value   RBC 4.02 (*)    MCV 100.5 (*)    MCH 34.8 (*)    All other components within normal limits  URINALYSIS, ROUTINE W REFLEX MICROSCOPIC - Abnormal; Notable for the following components:   Hgb urine dipstick SMALL (*)    Leukocytes,Ua TRACE (*)    Bacteria, UA RARE (*)    All other components within normal limits  PROTIME-INR - Abnormal; Notable for the following components:   Prothrombin Time 21.3 (*)    INR 1.9 (*)    All other components within normal limits  COMPREHENSIVE METABOLIC PANEL - Abnormal; Notable for the following components:   Glucose, Bld 102 (*)    Creatinine, Ser 1.49 (*)    Calcium 8.8 (*)    Albumin 3.2 (*)    GFR, Estimated 53 (*)    All other components within normal limits  I-STAT CHEM 8, ED - Abnormal; Notable for the following components:   Potassium 5.4 (*)    BUN 24 (*)    Creatinine, Ser 1.40 (*)    Glucose, Bld 108 (*)    Calcium, Ion 1.12 (*)    All other components within normal limits  RESP PANEL BY RT-PCR (FLU A&B, COVID) ARPGX2  ETHANOL  LACTIC ACID, PLASMA    EKG EKG Interpretation  Date/Time:  Wednesday October 24 2021 09:38:23 EDT Ventricular Rate:  49 PR Interval:    QRS Duration: 94 QT  Interval:  456 QTC Calculation: 412 R Axis:   14 Text Interpretation: Sinus bradycardia Borderline low voltage, extremity leads When compared with ECG of 08/23/2020, No  significant change was found Confirmed by Delora Fuel ([id number]) on 10/25/2021 5:47:26 AM  Radiology No results found.  Procedures Procedures    Medications Ordered in ED Medications - No data to display  ED Course/ Medical Decision Making/ A&P                           Medical Decision Making Amount and/or Complexity of Data Reviewed Labs: ordered. Radiology: ordered. ECG/medicine tests: ordered.  Risk Decision regarding hospitalization.   This patient presents to the ED for concern of fall, this involves an extensive number of treatment options, and is a complaint that carries with it a high risk of complications and morbidity.  The differential diagnosis includes acute injuries   Co morbidities that complicate the patient evaluation  HIV, CKD, left hip avascular necrosis, at bedtime, DVT, obesity, HTN, GERD, asthma, arthritis   Additional history obtained:  Additional history obtained from EMS External records from outside source obtained and reviewed including EMR   Lab Tests:  I Ordered, and personally interpreted labs.  The pertinent results include: Baseline CKD, normal hemoglobin, no leukocytosis, INR of 1.9.   Imaging Studies ordered:  I ordered imaging studies including x-ray of chest and pelvis, CT scan of head, cervical spine, face, chest, abdomen, and pelvis I independently visualized and interpreted imaging which showed small subdural hematoma without mass effect, with no other traumatic injuries I agree with the radiologist interpretation   Cardiac Monitoring: / EKG:  The patient was maintained on a cardiac monitor.  I personally viewed and interpreted the cardiac monitored which showed an underlying rhythm of: Sinus rhythm   Consultations Obtained:  I requested consultation with  the neurosurgery,  and discussed lab and imaging findings as well as pertinent plan - they recommend: Admission to their service   Problem List / ED Course / Critical interventions / Medication management  Patient arrives as a level 2 trauma due to a fall on blood thinners.  He describes the fall as mechanical in nature.  During this fall, he did tumble down approximately 18 steps.  He did lose consciousness during this fall.  There was report of some mild confusion on scene, however, EMS reports that he has been alert and oriented with them.  Patient arrives in the ED alert and oriented.  He has evidence of a left ear laceration.  He describes left-sided headache pain, ear pain, and posterior shoulder pain.  History left shoulder is tender to palpation.  He has no other areas of tenderness.  He has no areas of deformity on exam.  Bilateral breath sounds are present.  He is alert and oriented.  Lab work and imaging studies were ordered.  Tetanus was updated.  As needed fentanyl was ordered.  Patient's work-up is notable for a small subdural hematoma without mass effect.  Neurosurgery was consulted.  Neurosurgery to admit to their service for observation and repeat imaging for stability. I ordered medication including fentanyl for analgesia Reevaluation of the patient after these medicines showed that the patient stayed the same I have reviewed the patients home medicines and have made adjustments as needed   Social Determinants of Health:  Has access to outpatient care  CRITICAL CARE Performed by: Godfrey Pick   Total critical care time: 32 minutes  Critical care time was exclusive of separately billable procedures and treating other patients.  Critical care was necessary to treat or prevent imminent or life-threatening deterioration.  Critical care  was time spent personally by me on the following activities: development of treatment plan with patient and/or surrogate as well as nursing,  discussions with consultants, evaluation of patient's response to treatment, examination of patient, obtaining history from patient or surrogate, ordering and performing treatments and interventions, ordering and review of laboratory studies, ordering and review of radiographic studies, pulse oximetry and re-evaluation of patient's condition.         Final Clinical Impression(s) / ED Diagnoses Final diagnoses:  Subdural hematoma ([place])  Fall, initial encounter    Rx / DC Orders ED Discharge Orders          Ordered    Increase activity slowly        10/24/21 2323    Diet - low sodium heart healthy        10/24/21 2323    Call MD for:  temperature >100.4        10/24/21 2323    Call MD for:  persistant nausea and vomiting        10/24/21 2323    Call MD for:  severe uncontrolled pain        10/24/21 2323    Discharge instructions       Comments: Resume blood thinners in 5-7 days   10/24/21 2323              Godfrey Pick, MD 10/27/21 1227

## 2021-10-24 NOTE — Progress Notes (Signed)
Responded to  level 2 ED page to support patient that experienced fall down 18 steps. Pt is ok . Gone to CT for scan.  Provided emotional and spiritual support to pt. Will follow as needed.  Jaclynn Major, [place], [facility], Pager [phone]

## 2021-10-24 NOTE — ED Notes (Signed)
Allen Espinoza pt significant other and pt mother left at this time. Allen requested to be called with updates and admissions.

## 2021-10-24 NOTE — Progress Notes (Signed)
Orthopedic Tech Progress Note Patient Details:  Allen Espinoza 1960/07/30 [id number]  Level 2 trauma   Patient ID: Allen Espinoza, male   DOB: 1960-05-06, 61 y.o.   MRN: [id number]  Janit Pagan 10/24/2021, 9:14 AM

## 2021-10-24 NOTE — H&P (Signed)
Allen Espinoza is an 61 y.o. male.   HPI:  61 year old male presented to the ED today after sustaining a fall down about 18 steps. He states that he was carrying his laundry basket down the stairs when he tripped and fell. 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 He does not remember the last time he had a DVT but it has "been awhile." He denies any headaches NV dizziness or vision changes. He also denies any neck pain, numbness tingling or pain down his arms.   Past Medical History:  Diagnosis Date   Abscess    Between legs   Arthritis    all over   Asthma    Avascular necrosis of femoral head, left (HCC)    Blood transfusion without reported diagnosis    Cholelithiasis    CKD (chronic kidney disease) stage 3, GFR 30-59 ml/min (HCC)    sees kidney Dr.   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[place])    left DVT   Diverticulosis    DVT (deep venous thrombosis) (HCC)    legs   Dyspnea    when walking   Dysrhythmia    remembers mother taking about having an irregular rhythm years when he was a child    GERD (gastroesophageal reflux disease)    HIV (human immunodeficiency virus infection) ([place])    Hypertension    Morbid obesity ([place])     Past Surgical History:  Procedure Laterality Date   COLONOSCOPY  2019   DENTAL SURGERY     had teeth pulled   HYDRADENITIS EXCISION Left 10/14/2016   Procedure: WIDE EXCISION HIDRADENITIS LEFT AXILLA;  Surgeon: Coralie Keens, MD;  Location: [facility];  Service: General;  Laterality: Left;   JOINT REPLACEMENT     Left hip Dr. Ninfa Linden 11/29/16   TOTAL HIP ARTHROPLASTY Left 11/29/2016   Procedure: LEFT TOTAL HIP ARTHROPLASTY ANTERIOR APPROACH;  Surgeon: [name], MD;  Location: [facility];  Service: Orthopedics;  Laterality: Left;    Allergies  Allergen Reactions   Sulfa Antibiotics Other (See Comments)    Low blood pressure   Vancomycin Itching    Give with benadryl    Social History   Tobacco Use   Smoking status: Never    Smokeless tobacco: Never  Substance Use Topics   Alcohol use: No    Comment: prior    Family History  Problem Relation Age of Onset   Heart attack Mother 80   Heart attack Father 29   Prostate cancer Brother    Bone cancer Maternal Aunt    Breast cancer Maternal Aunt    Colon cancer Neg Hx    Esophageal cancer Neg Hx    Stomach cancer Neg Hx    Rectal cancer Neg Hx    Migraines Neg Hx    Headache Neg Hx      Review of Systems  Positive ROS: as above  All other systems have been reviewed and were otherwise negative with the exception of those mentioned in the HPI and as above.  Objective: Vital signs in last 24 hours: Temp:  [98 F (36.7 C)] 98 F (36.7 C) (07/26 0915) Pulse Rate:  [52-57] 57 (07/26 1046) Resp:  [20-21] 21 (07/26 1046) BP: (136-152)/(70-80) 136/70 (07/26 1046) SpO2:  [96 %-98 %] 98 % (07/26 1046) Weight:  [149.2 kg] 149.2 kg (07/26 0925)  General Appearance: Alert, cooperative, no distress, appears stated age Head: Normocephalic, without obvious abnormality, atraumatic Eyes:  PERRL, conjunctiva/corneas clear, EOM's intact, fundi benign, both eyes      Ears: Normal TM's and external ear canals, both ears Lungs:respirations unlabored Heart: Regular rate and rhythm Skin: Skin color, texture, turgor normal, no rashes or lesions  NEUROLOGIC:   Mental status: A&O x4, no aphasia, good attention span, Memory and fund of knowledge Motor Exam - grossly normal, normal tone and bulk Sensory Exam - grossly normal Reflexes: symmetric, no pathologic reflexes, No Hoffman's, No clonus Coordination - grossly normal Gait - not tested Balance - not tested Cranial Nerves: I: smell Not tested  II: visual acuity  OS: na    OD: na  II: visual fields Full to confrontation  II: pupils Equal, round, reactive to light  III,VII: ptosis None  III,IV,VI: extraocular muscles  Full ROM  V: mastication Normal  V: facial light touch sensation  Normal  V,VII: corneal reflex   Present  VII: facial muscle function - upper  Normal  VII: facial muscle function - lower Normal  VIII: hearing Not tested  IX: soft palate elevation  Normal  IX,X: gag reflex Present  XI: trapezius strength  5/5  XI: sternocleidomastoid strength 5/5  XI: neck flexion strength  5/5  XII: tongue strength  Normal    Data Review Lab Results  Component Value Date   WBC 6.7 10/24/2021   HGB 14.3 10/24/2021   HCT 42.0 10/24/2021   MCV 100.5 (H) 10/24/2021   PLT 313 10/24/2021   Lab Results  Component Value Date   NA 140 10/24/2021   K 5.4 (H) 10/24/2021   CL 105 10/24/2021   CO2 26 07/25/2021   BUN 24 (H) 10/24/2021   CREATININE 1.40 (H) 10/24/2021   GLUCOSE 108 (H) 10/24/2021   Lab Results  Component Value Date   INR 1.9 (H) 10/24/2021    Radiology: DG Shoulder Left  Result Date: 10/24/2021 CLINICAL DATA:  Fall down steps this morning.  Left shoulder pain. EXAM: LEFT SHOULDER - 2+ VIEW COMPARISON:  None Available. FINDINGS: There is no evidence of fracture or dislocation. Mild DJD of the acromioclavicular joint noted. Multiple old left rib fracture deformities are seen. Soft tissues are unremarkable. IMPRESSION: No acute findings. Mild acromioclavicular DJD. Electronically Signed   By: Marlaine Hind M.D.   On: 10/24/2021 10:22   CT CHEST ABDOMEN PELVIS WO CONTRAST  Result Date: 10/24/2021 CLINICAL DATA:  Trauma EXAM: CT CHEST, ABDOMEN AND PELVIS WITHOUT CONTRAST TECHNIQUE: Multidetector CT imaging of the chest, abdomen and pelvis was performed following the standard protocol without IV contrast. RADIATION DOSE REDUCTION: This exam was performed according to the departmental dose-optimization program which includes automated exposure control, adjustment of the mA and/or kV according to patient size and/or use of iterative reconstruction technique. COMPARISON:  CT abdomen and pelvis dated January 29, 2016 FINDINGS: CT CHEST FINDINGS Cardiovascular: Normal heart size. No pericardial  effusion. Normal caliber thoracic aorta with no significant atherosclerotic disease. Mediastinum/Nodes: Esophagus and thyroid are unremarkable. No pathologically enlarged lymph nodes seen in the chest. Lungs/Pleura: Central airways are patent. Linear opacity of the left lower lobe, likely due to scarring or atelectasis. No consolidation, pleural effusion or pneumothorax. Musculoskeletal: Old left-sided rib fractures. No acute osseous abnormality seen in the region of the chest. CT ABDOMEN PELVIS FINDINGS Hepatobiliary: No focal liver abnormality. Gallstones with no gallbladder wall thickening. Pancreas: Unremarkable. No pancreatic ductal dilatation or surrounding inflammatory changes. Spleen: Normal in size without focal abnormality. Adrenals/Urinary Tract: Bilateral adrenal glands are unremarkable. No hydronephrosis or nephrolithiasis.  Lobulated renal contours, similar to prior exam, no definite discrete lesion. Bladder is unremarkable. Stomach/Bowel: Stomach is within normal limits. Appendix appears normal. No evidence of bowel wall thickening, distention, or inflammatory changes. Vascular/Lymphatic: Mild aortic atherosclerosis. No enlarged abdominal or pelvic lymph nodes. 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 Mildly enlarged right inguinal lymph node measuring 1.3 cm, unchanged when compared with prior exam and likely reactive. Multiple collateral vessels are noted in the subcutaneous soft tissues, similar to prior exam. Reproductive: Prostate is unremarkable. Other: Gynecomastia.  No abdominopelvic ascites. Musculoskeletal: Prior total left hip replacement. No acute osseous abnormality seen in the region of the abdomen or pelvis. IMPRESSION: Within limitations of noncontrast exam, no acute traumatic abnormalities seen in the chest, abdomen or pelvis. Electronically Signed   By: Yetta Glassman  M.D.   On: 10/24/2021 10:19   CT HEAD WO CONTRAST  Result Date: 10/24/2021 CLINICAL DATA:  Head trauma, level 2 trauma, fell down 18 steps, loss of consciousness EXAM: CT HEAD WITHOUT CONTRAST CT MAXILLOFACIAL WITHOUT CONTRAST CT CERVICAL SPINE WITHOUT CONTRAST TECHNIQUE: Multidetector CT imaging of the head, cervical spine, and maxillofacial structures were performed using the standard protocol without intravenous contrast. Multiplanar CT image reconstructions of the cervical spine and maxillofacial structures were also generated. RADIATION DOSE REDUCTION: This exam was performed according to the departmental dose-optimization program which includes automated exposure control, adjustment of the mA and/or kV according to patient size and/or use of iterative reconstruction technique. COMPARISON:  CT head 07/25/2021 FINDINGS: CT HEAD FINDINGS Brain: Generalized atrophy. Normal ventricular morphology. No midline shift or mass effect. Small LEFT lateral frontal and frontotemporal subdural hematoma measuring 3 mm thick. No significant mass effect upon LEFT hemisphere. No intraparenchymal hemorrhage, mass lesion, or evidence of acute infarction. Vascular: No hyperdense vessels. Skull: Intact Other: N/A CT MAXILLOFACIAL FINDINGS Osseous: TMJ alignment normal bilaterally. Facial bones intact. Denture plates present. Orbits: Intraorbital soft tissue planes clear without fluid or gas Sinuses: Paranasal sinuses, mastoid air cells, and middle ear cavities clear Soft tissues: Asymmetric increased attenuation and edema of LEFT parotid gland versus RIGHT question sequela of trauma or parotitis. CT CERVICAL SPINE FINDINGS Alignment: Normal Skull base and vertebrae: Vertebral body heights maintained. Multilevel disc space narrowing and endplate spur formation. Minimal scattered facet degenerative changes. No fracture, subluxation, or bone destruction. Visualized skull base intact. Spurring from tip of odontoid process. Costal  bars at posterior first through fourth ribs. Soft tissues and spinal canal: Prevertebral soft tissues normal thickness. Edema LEFT parotid gland as noted above. No regional soft tissue abnormalities otherwise seen. Disc levels: Narrowing multifactorial spinal stenosis C3-C4 by endplate spurring, bulging disc, and facet hypertrophy. Residual AP diameter of thecal sac 5 mm at midline. Less severe AP narrowing at C4-C5 and C5-C6. Upper chest: Lung apices clear Other: N/A IMPRESSION: Generalized atrophy. Small LEFT lateral frontal and frontotemporal subdural hematoma measuring 3 mm thick without significant mass effect upon LEFT hemisphere. No acute facial bone abnormalities. Multilevel degenerative disc and facet disease changes of the cervical spine with multifactorial spinal stenosis at C3-C4 and to a lesser degree at C4-C5 and C5-C6. No acute cervical spine abnormalities. Critical Value/emergent results were called by telephone at the time of interpretation on 10/24/2021 at 10:17 am to provider Godfrey Pick , who verbally acknowledged these results. Electronically Signed   By: Lavonia Dana M.D.   On: 10/24/2021 10:17  CT CERVICAL SPINE WO CONTRAST  Result Date: 10/24/2021 CLINICAL DATA:  Head trauma, level 2 trauma, fell down 18 steps, loss of consciousness EXAM: CT HEAD WITHOUT CONTRAST CT MAXILLOFACIAL WITHOUT CONTRAST CT CERVICAL SPINE WITHOUT CONTRAST TECHNIQUE: Multidetector CT imaging of the head, cervical spine, and maxillofacial structures were performed using the standard protocol without intravenous contrast. Multiplanar CT image reconstructions of the cervical spine and maxillofacial structures were also generated. RADIATION DOSE REDUCTION: This exam was performed according to the departmental dose-optimization program which includes automated exposure control, adjustment of the mA and/or kV according to patient size and/or use of iterative reconstruction technique. COMPARISON:  CT head 07/25/2021  FINDINGS: CT HEAD FINDINGS Brain: Generalized atrophy. Normal ventricular morphology. No midline shift or mass effect. Small LEFT lateral frontal and frontotemporal subdural hematoma measuring 3 mm thick. No significant mass effect upon LEFT hemisphere. No intraparenchymal hemorrhage, mass lesion, or evidence of acute infarction. Vascular: No hyperdense vessels. Skull: Intact Other: N/A CT MAXILLOFACIAL FINDINGS Osseous: TMJ alignment normal bilaterally. Facial bones intact. Denture plates present. Orbits: Intraorbital soft tissue planes clear without fluid or gas Sinuses: Paranasal sinuses, mastoid air cells, and middle ear cavities clear Soft tissues: Asymmetric increased attenuation and edema of LEFT parotid gland versus RIGHT question sequela of trauma or parotitis. CT CERVICAL SPINE FINDINGS Alignment: Normal Skull base and vertebrae: Vertebral body heights maintained. Multilevel disc space narrowing and endplate spur formation. Minimal scattered facet degenerative changes. No fracture, subluxation, or bone destruction. Visualized skull base intact. Spurring from tip of odontoid process. Costal bars at posterior first through fourth ribs. Soft tissues and spinal canal: Prevertebral soft tissues normal thickness. Edema LEFT parotid gland as noted above. No regional soft tissue abnormalities otherwise seen. Disc levels: Narrowing multifactorial spinal stenosis C3-C4 by endplate spurring, bulging disc, and facet hypertrophy. Residual AP diameter of thecal sac 5 mm at midline. Less severe AP narrowing at C4-C5 and C5-C6. Upper chest: Lung apices clear Other: N/A IMPRESSION: Generalized atrophy. Small LEFT lateral frontal and frontotemporal subdural hematoma measuring 3 mm thick without significant mass effect upon LEFT hemisphere. No acute facial bone abnormalities. Multilevel degenerative disc and facet disease changes of the cervical spine with multifactorial spinal stenosis at C3-C4 and to a lesser degree at  C4-C5 and C5-C6. No acute cervical spine abnormalities. Critical Value/emergent results were called by telephone at the time of interpretation on 10/24/2021 at 10:17 am to provider Godfrey Pick , who verbally acknowledged these results. Electronically Signed   By: Lavonia Dana M.D.   On: 10/24/2021 10:17   CT MAXILLOFACIAL WO CONTRAST  Result Date: 10/24/2021 CLINICAL DATA:  Head trauma, level 2 trauma, fell down 18 steps, loss of consciousness EXAM: CT HEAD WITHOUT CONTRAST CT MAXILLOFACIAL WITHOUT CONTRAST CT CERVICAL SPINE WITHOUT CONTRAST TECHNIQUE: Multidetector CT imaging of the head, cervical spine, and maxillofacial structures were performed using the standard protocol without intravenous contrast. Multiplanar CT image reconstructions of the cervical spine and maxillofacial structures were also generated. RADIATION DOSE REDUCTION: This exam was performed according to the departmental dose-optimization program which includes automated exposure control, adjustment of the mA and/or kV according to patient size and/or use of iterative reconstruction technique. COMPARISON:  CT head 07/25/2021 FINDINGS: CT HEAD FINDINGS Brain: Generalized atrophy. Normal ventricular morphology. No midline shift or mass effect. Small LEFT lateral frontal and frontotemporal subdural hematoma measuring 3 mm thick. No significant mass effect upon LEFT hemisphere. No intraparenchymal hemorrhage, mass lesion, or evidence of acute infarction. Vascular: No hyperdense vessels. Skull: Intact Other:  N/A CT MAXILLOFACIAL FINDINGS Osseous: TMJ alignment normal bilaterally. Facial bones intact. Denture plates present. Orbits: Intraorbital soft tissue planes clear without fluid or gas Sinuses: Paranasal sinuses, mastoid air cells, and middle ear cavities clear Soft tissues: Asymmetric increased attenuation and edema of LEFT parotid gland versus RIGHT question sequela of trauma or parotitis. CT CERVICAL SPINE FINDINGS Alignment: Normal Skull base  and vertebrae: Vertebral body heights maintained. Multilevel disc space narrowing and endplate spur formation. Minimal scattered facet degenerative changes. No fracture, subluxation, or bone destruction. Visualized skull base intact. Spurring from tip of odontoid process. Costal bars at posterior first through fourth ribs. Soft tissues and spinal canal: Prevertebral soft tissues normal thickness. Edema LEFT parotid gland as noted above. No regional soft tissue abnormalities otherwise seen. Disc levels: Narrowing multifactorial spinal stenosis C3-C4 by endplate spurring, bulging disc, and facet hypertrophy. Residual AP diameter of thecal sac 5 mm at midline. Less severe AP narrowing at C4-C5 and C5-C6. Upper chest: Lung apices clear Other: N/A IMPRESSION: Generalized atrophy. Small LEFT lateral frontal and frontotemporal subdural hematoma measuring 3 mm thick without significant mass effect upon LEFT hemisphere. No acute facial bone abnormalities. Multilevel degenerative disc and facet disease changes of the cervical spine with multifactorial spinal stenosis at C3-C4 and to a lesser degree at C4-C5 and C5-C6. No acute cervical spine abnormalities. Critical Value/emergent results were called by telephone at the time of interpretation on 10/24/2021 at 10:17 am to provider Godfrey Pick , who verbally acknowledged these results. Electronically Signed   By: Lavonia Dana M.D.   On: 10/24/2021 10:17   DG Chest Port 1 View  Result Date: 10/24/2021 CLINICAL DATA:  Trauma fell down 18 steps, laceration to head abrasion to left shoulder EXAM: PORTABLE CHEST 1 VIEW COMPARISON:  September 28, 2020 FINDINGS: The heart size and mediastinal contours are within normal limits. Stable vertical band of scarring at the left lower lung. Otherwise, lungs are clear without consolidation, pleural effusion or vascular congestion. Again seen are the multiple old healed left rib fractures. Moderate thoracic spondylosis. IMPRESSION: No active  disease. Electronically Signed   By: Frazier Richards M.D.   On: 10/24/2021 09:44   DG Pelvis Portable  Result Date: 10/24/2021 CLINICAL DATA:  61 year old male status post fall down stairs. EXAM: PORTABLE PELVIS 1-2 VIEWS COMPARISON:  Postoperative pelvis x-ray 11/29/2016. CT Abdomen and Pelvis 01/29/2016. FINDINGS: Portable AP supine view at 0933 hours. Chronic left bipolar hip arthroplasty. Visible hardware appears intact and aligned. No pelvis fracture identified. Symphysis and SI joints appear stable. Negative visible lower abdominal and pelvic visceral contours. IMPRESSION: No acute fracture or dislocation identified about the pelvis. Chronic left hip arthroplasty. Electronically Signed   By: Genevie Ann M.D.   On: 10/24/2021 09:44     Assessment/Plan: 61 year old male presented to the the ED after a fall down stairs. CT head shows a small left frontotemporal SDH with no mass effect or midline shift. His cervical CT does show some spinal stenosis at C3-C6 but appears chronic in nature. No acute fracture noted in cervical spine. We will admit him for observation since he is on xarelto. Repeat head CT in the morning and will likely discharge tomorrow if CT head is stable. No blood thinner for the next 5-7 days.    Ocie Cornfield [facility] 10/24/2021 11:36 AM

## 2021-10-24 NOTE — Progress Notes (Signed)
Follow-up head CT reviewed.  Minimal if any residual left frontotemporal subdural hematoma without mass effect or shift.  No basilar skull fracture.  We think he is safe for discharge home.  Would resume anticoagulants in 5 to 7 days.

## 2021-10-24 NOTE — ED Notes (Signed)
Pt c/o HA in left side to forehead that is worse following medication.

## 2021-10-24 NOTE — ED Triage Notes (Signed)
Pt BIB [facility] due to falling down 18 steps with laundry basket and currently taking xarelto.  Pt can move all extremities and GCS 15.  Pt had LOC with the fall.  Pt has left ear and head abrasion as well as left knee abrasion.  20g left hand.

## 2021-10-24 NOTE — ED Notes (Signed)
Patient transported to CT 

## 2021-10-24 NOTE — ED Notes (Signed)
Neurosurgeon paged about pt's worsening h/a despite pain medications. Pt A+O times four

## 2021-10-24 NOTE — ED Notes (Signed)
Trauma Response Nurse Documentation   Allen Espinoza is a 61 y.o. male arriving to [facility] ED via EMS  On Xarelto (rivaroxaban) daily. Trauma was activated as a Level 2 by ED Charge RN based on the following trauma criteria Elderly patients > 65 with head trauma on anti-coagulation (excluding ASA). Trauma team at the bedside on patient arrival. Patient cleared for CT by Dr. Doren Custard. Patient to CT with team. GCS 15.  History   Past Medical History:  Diagnosis Date   Abscess    Between legs   Arthritis    all over   Asthma    Avascular necrosis of femoral head, left (HCC)    Blood transfusion without reported diagnosis    Cholelithiasis    CKD (chronic kidney disease) stage 3, GFR 30-59 ml/min (HCC)    sees kidney Dr.   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[place])    left DVT   Diverticulosis    DVT (deep venous thrombosis) (HCC)    legs   Dyspnea    when walking   Dysrhythmia    remembers mother taking about having an irregular rhythm years when he was a child    GERD (gastroesophageal reflux disease)    HIV (human immunodeficiency virus infection) ([place])    Hypertension    Morbid obesity ([place])      Past Surgical History:  Procedure Laterality Date   COLONOSCOPY  2019   DENTAL SURGERY     had teeth pulled   HYDRADENITIS EXCISION Left 10/14/2016   Procedure: WIDE EXCISION HIDRADENITIS LEFT AXILLA;  Surgeon: Coralie Keens, MD;  Location: [facility];  Service: General;  Laterality: Left;   JOINT REPLACEMENT     Left hip Dr. Ninfa Linden 11/29/16   TOTAL HIP ARTHROPLASTY Left 11/29/2016   Procedure: LEFT TOTAL HIP ARTHROPLASTY ANTERIOR APPROACH;  Surgeon: [name], MD;  Location: [facility];  Service: Orthopedics;  Laterality: Left;     Initial Focused Assessment (If applicable, or please see trauma documentation): - GCS 15 - PERRLA - Pulses intact in all 4 extremities - lung sounds clear and equal bilaterally - lac to L ear and L head with bleeding controlled - C-collar in place - L  hand PIV 20G - small abrasion to L knee - VS WDL  CT's Completed:   CT Head, CT Maxillofacial, and CT C-Spine  CT chest abd pelvis without contrast  Interventions:  - 20G PIV to R [place] - labs drawn - CT pan scan without contrast (pt has hx of stage 3 kidney disease) - CXR - Pelvic XR - L shoulder XR  Plan for disposition:  Admission to floor for obs - possible d/c tomorrow depending on f/u CT scan.  Consults completed:  Neurosurgeon at 1136.  Event Summary: Pt was attempting to leave home to go to the laundrymat when he fell down 18 steps.  Pt did hit his head and is on xarelto.  Pt did have + LOC.  Pt presents to ED in a c-collar with controlled bleeding to left ear.   Bedside handoff with ED RN [place].    [name]  Trauma Response RN  Please call TRN at [phone] for further assistance.

## 2021-10-24 NOTE — Discharge Summary (Signed)
Physician Discharge Summary  Patient ID: Allen Espinoza MRN: [id number] DOB/AGE: [birth year] 61 y.o.  Admit date: 10/24/2021 Discharge date: 10/24/2021  Admission Diagnoses: Closed head injury   Discharge Diagnoses: Same   Discharged Condition: stable  Hospital Course: The patient was admitted on 10/24/2021 after a fall down about 18 steps in which she suffered a mild closed head injury with a tiny subdural hematoma on the left.  He had minimal headache.  Follow-up head CT showed minimal residual subdural blood. The hospital course was routine. There were no complications.  Pt had minimal headache . no complaints of visual changes or new N/T/W. The patient remained afebrile with stable vital signs, and tolerated a regular diet. The patient continued to increase activities.  We felt he was safe for discharge home with head injury instructions.  He is to resume his Xarelto in 5 to 7 days  Consults: None  Significant Diagnostic Studies:  Results for orders placed or performed during the hospital encounter of 10/24/21  Resp Panel by RT-PCR (Flu A&B, Covid) Anterior Nasal Swab   Specimen: Anterior Nasal Swab  Result Value Ref Range   SARS Coronavirus 2 by RT PCR NEGATIVE NEGATIVE   Influenza A by PCR NEGATIVE NEGATIVE   Influenza B by PCR NEGATIVE NEGATIVE  CBC  Result Value Ref Range   WBC 6.7 4.0 - 10.5 K/uL   RBC 4.02 (L) 4.22 - 5.81 MIL/uL   Hemoglobin 14.0 13.0 - 17.0 g/dL   HCT 40.4 39.0 - 52.0 %   MCV 100.5 (H) 80.0 - 100.0 fL   MCH 34.8 (H) 26.0 - 34.0 pg   MCHC 34.7 30.0 - 36.0 g/dL   RDW 12.8 11.5 - 15.5 %   Platelets 313 150 - 400 K/uL   nRBC 0.0 0.0 - 0.2 %  Ethanol  Result Value Ref Range   Alcohol, Ethyl (B) <10 <10 mg/dL  Urinalysis, Routine w reflex microscopic  Result Value Ref Range   Color, Urine YELLOW YELLOW   APPearance CLEAR CLEAR   Specific Gravity, Urine 1.023 1.005 - 1.030   pH 5.0 5.0 - 8.0   Glucose, UA NEGATIVE NEGATIVE mg/dL   Hgb urine dipstick  SMALL (A) NEGATIVE   Bilirubin Urine NEGATIVE NEGATIVE   Ketones, ur NEGATIVE NEGATIVE mg/dL   Protein, ur NEGATIVE NEGATIVE mg/dL   Nitrite NEGATIVE NEGATIVE   Leukocytes,Ua TRACE (A) NEGATIVE   RBC / HPF 11-20 0 - 5 RBC/hpf   WBC, UA 0-5 0 - 5 WBC/hpf   Bacteria, UA RARE (A) NONE SEEN   Squamous Epithelial / LPF 0-5 0 - 5   Mucus PRESENT    Hyaline Casts, UA PRESENT   Lactic acid, plasma  Result Value Ref Range   Lactic Acid, Venous 1.3 0.5 - 1.9 mmol/L  Protime-INR  Result Value Ref Range   Prothrombin Time 21.3 (H) 11.4 - 15.2 seconds   INR 1.9 (H) 0.8 - 1.2  Comprehensive metabolic panel  Result Value Ref Range   Sodium 140 135 - 145 mmol/L   Potassium 3.9 3.5 - 5.1 mmol/L   Chloride 108 98 - 111 mmol/L   CO2 25 22 - 32 mmol/L   Glucose, Bld 102 (H) 70 - 99 mg/dL   BUN 16 8 - 23 mg/dL   Creatinine, Ser 1.49 (H) 0.61 - 1.24 mg/dL   Calcium 8.8 (L) 8.9 - 10.3 mg/dL   Total Protein 7.8 6.5 - 8.1 g/dL   Albumin 3.2 (L) 3.5 - 5.0 g/dL  AST 19 15 - 41 U/L   ALT 13 0 - 44 U/L   Alkaline Phosphatase 67 38 - 126 U/L   Total Bilirubin 0.3 0.3 - 1.2 mg/dL   GFR, Estimated 53 (L) >60 mL/min   Anion gap 7 5 - 15  I-Stat Chem 8, ED  Result Value Ref Range   Sodium 140 135 - 145 mmol/L   Potassium 5.4 (H) 3.5 - 5.1 mmol/L   Chloride 105 98 - 111 mmol/L   BUN 24 (H) 8 - 23 mg/dL   Creatinine, Ser 1.40 (H) 0.61 - 1.24 mg/dL   Glucose, Bld 108 (H) 70 - 99 mg/dL   Calcium, Ion 1.12 (L) 1.15 - 1.40 mmol/L   TCO2 26 22 - 32 mmol/L   Hemoglobin 14.3 13.0 - 17.0 g/dL   HCT 42.0 39.0 - 52.0 %    DG Shoulder Left  Result Date: 10/24/2021 CLINICAL DATA:  Fall down steps this morning.  Left shoulder pain. EXAM: LEFT SHOULDER - 2+ VIEW COMPARISON:  None Available. FINDINGS: There is no evidence of fracture or dislocation. Mild DJD of the acromioclavicular joint noted. Multiple old left rib fracture deformities are seen. Soft tissues are unremarkable. IMPRESSION: No acute findings. Mild  acromioclavicular DJD. Electronically Signed   By: Marlaine Hind M.D.   On: 10/24/2021 10:22   CT CHEST ABDOMEN PELVIS WO CONTRAST  Result Date: 10/24/2021 CLINICAL DATA:  Trauma EXAM: CT CHEST, ABDOMEN AND PELVIS WITHOUT CONTRAST TECHNIQUE: Multidetector CT imaging of the chest, abdomen and pelvis was performed following the standard protocol without IV contrast. RADIATION DOSE REDUCTION: This exam was performed according to the departmental dose-optimization program which includes automated exposure control, adjustment of the mA and/or kV according to patient size and/or use of iterative reconstruction technique. COMPARISON:  CT abdomen and pelvis dated January 29, 2016 FINDINGS: CT CHEST FINDINGS Cardiovascular: Normal heart size. No pericardial effusion. Normal caliber thoracic aorta with no significant atherosclerotic disease. Mediastinum/Nodes: Esophagus and thyroid are unremarkable. No pathologically enlarged lymph nodes seen in the chest. Lungs/Pleura: Central airways are patent. Linear opacity of the left lower lobe, likely due to scarring or atelectasis. No consolidation, pleural effusion or pneumothorax. Musculoskeletal: Old left-sided rib fractures. No acute osseous abnormality seen in the region of the chest. CT ABDOMEN PELVIS FINDINGS Hepatobiliary: No focal liver abnormality. Gallstones with no gallbladder wall thickening. Pancreas: Unremarkable. No pancreatic ductal dilatation or surrounding inflammatory changes. Spleen: Normal in size without focal abnormality. Adrenals/Urinary Tract: Bilateral adrenal glands are unremarkable. No hydronephrosis or nephrolithiasis. Lobulated renal contours, similar to prior exam, no definite discrete lesion. Bladder is unremarkable. Stomach/Bowel: Stomach is within normal limits. Appendix appears normal. No evidence of bowel wall thickening, distention, or inflammatory changes. Vascular/Lymphatic: Mild aortic atherosclerosis. No enlarged abdominal or pelvic lymph  nodes. 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 Mildly enlarged right inguinal lymph node measuring 1.3 cm, unchanged when compared with prior exam and likely reactive. Multiple collateral vessels are noted in the subcutaneous soft tissues, similar to prior exam. Reproductive: Prostate is unremarkable. Other: Gynecomastia.  No abdominopelvic ascites. Musculoskeletal: Prior total left hip replacement. No acute osseous abnormality seen in the region of the abdomen or pelvis. IMPRESSION: Within limitations of noncontrast exam, no acute traumatic abnormalities seen in the chest, abdomen or pelvis. Electronically Signed   By: Yetta Glassman M.D.   On: 10/24/2021 10:19   CT HEAD  WO CONTRAST  Result Date: 10/24/2021 CLINICAL DATA:  Head trauma, level 2 trauma, fell down 18 steps, loss of consciousness EXAM: CT HEAD WITHOUT CONTRAST CT MAXILLOFACIAL WITHOUT CONTRAST CT CERVICAL SPINE WITHOUT CONTRAST TECHNIQUE: Multidetector CT imaging of the head, cervical spine, and maxillofacial structures were performed using the standard protocol without intravenous contrast. Multiplanar CT image reconstructions of the cervical spine and maxillofacial structures were also generated. RADIATION DOSE REDUCTION: This exam was performed according to the departmental dose-optimization program which includes automated exposure control, adjustment of the mA and/or kV according to patient size and/or use of iterative reconstruction technique. COMPARISON:  CT head 07/25/2021 FINDINGS: CT HEAD FINDINGS Brain: Generalized atrophy. Normal ventricular morphology. No midline shift or mass effect. Small LEFT lateral frontal and frontotemporal subdural hematoma measuring 3 mm thick. No significant mass effect upon LEFT hemisphere. No intraparenchymal hemorrhage, mass lesion, or evidence of acute infarction. Vascular: No hyperdense  vessels. Skull: Intact Other: N/A CT MAXILLOFACIAL FINDINGS Osseous: TMJ alignment normal bilaterally. Facial bones intact. Denture plates present. Orbits: Intraorbital soft tissue planes clear without fluid or gas Sinuses: Paranasal sinuses, mastoid air cells, and middle ear cavities clear Soft tissues: Asymmetric increased attenuation and edema of LEFT parotid gland versus RIGHT question sequela of trauma or parotitis. CT CERVICAL SPINE FINDINGS Alignment: Normal Skull base and vertebrae: Vertebral body heights maintained. Multilevel disc space narrowing and endplate spur formation. Minimal scattered facet degenerative changes. No fracture, subluxation, or bone destruction. Visualized skull base intact. Spurring from tip of odontoid process. Costal bars at posterior first through fourth ribs. Soft tissues and spinal canal: Prevertebral soft tissues normal thickness. Edema LEFT parotid gland as noted above. No regional soft tissue abnormalities otherwise seen. Disc levels: Narrowing multifactorial spinal stenosis C3-C4 by endplate spurring, bulging disc, and facet hypertrophy. Residual AP diameter of thecal sac 5 mm at midline. Less severe AP narrowing at C4-C5 and C5-C6. Upper chest: Lung apices clear Other: N/A IMPRESSION: Generalized atrophy. Small LEFT lateral frontal and frontotemporal subdural hematoma measuring 3 mm thick without significant mass effect upon LEFT hemisphere. No acute facial bone abnormalities. Multilevel degenerative disc and facet disease changes of the cervical spine with multifactorial spinal stenosis at C3-C4 and to a lesser degree at C4-C5 and C5-C6. No acute cervical spine abnormalities. Critical Value/emergent results were called by telephone at the time of interpretation on 10/24/2021 at 10:17 am to provider Godfrey Pick , who verbally acknowledged these results. Electronically Signed   By: Lavonia Dana M.D.   On: 10/24/2021 10:17   CT CERVICAL SPINE WO CONTRAST  Result Date:  10/24/2021 CLINICAL DATA:  Head trauma, level 2 trauma, fell down 18 steps, loss of consciousness EXAM: CT HEAD WITHOUT CONTRAST CT MAXILLOFACIAL WITHOUT CONTRAST CT CERVICAL SPINE WITHOUT CONTRAST TECHNIQUE: Multidetector CT imaging of the head, cervical spine, and maxillofacial structures were performed using the standard protocol without intravenous contrast. Multiplanar CT image reconstructions of the cervical spine and maxillofacial structures were also generated. RADIATION DOSE REDUCTION: This exam was performed according to the departmental dose-optimization program which includes automated exposure control, adjustment of the mA and/or kV according to patient size and/or use of iterative reconstruction technique. COMPARISON:  CT head 07/25/2021 FINDINGS: CT HEAD FINDINGS Brain: Generalized atrophy. Normal ventricular morphology. No midline shift or mass effect. Small LEFT lateral frontal and frontotemporal subdural hematoma measuring 3 mm thick. No significant mass effect upon LEFT hemisphere. No intraparenchymal hemorrhage, mass lesion, or evidence of acute infarction. Vascular: No hyperdense vessels. Skull: Intact Other: N/A CT  MAXILLOFACIAL FINDINGS Osseous: TMJ alignment normal bilaterally. Facial bones intact. Denture plates present. Orbits: Intraorbital soft tissue planes clear without fluid or gas Sinuses: Paranasal sinuses, mastoid air cells, and middle ear cavities clear Soft tissues: Asymmetric increased attenuation and edema of LEFT parotid gland versus RIGHT question sequela of trauma or parotitis. CT CERVICAL SPINE FINDINGS Alignment: Normal Skull base and vertebrae: Vertebral body heights maintained. Multilevel disc space narrowing and endplate spur formation. Minimal scattered facet degenerative changes. No fracture, subluxation, or bone destruction. Visualized skull base intact. Spurring from tip of odontoid process. Costal bars at posterior first through fourth ribs. Soft tissues and spinal  canal: Prevertebral soft tissues normal thickness. Edema LEFT parotid gland as noted above. No regional soft tissue abnormalities otherwise seen. Disc levels: Narrowing multifactorial spinal stenosis C3-C4 by endplate spurring, bulging disc, and facet hypertrophy. Residual AP diameter of thecal sac 5 mm at midline. Less severe AP narrowing at C4-C5 and C5-C6. Upper chest: Lung apices clear Other: N/A IMPRESSION: Generalized atrophy. Small LEFT lateral frontal and frontotemporal subdural hematoma measuring 3 mm thick without significant mass effect upon LEFT hemisphere. No acute facial bone abnormalities. Multilevel degenerative disc and facet disease changes of the cervical spine with multifactorial spinal stenosis at C3-C4 and to a lesser degree at C4-C5 and C5-C6. No acute cervical spine abnormalities. Critical Value/emergent results were called by telephone at the time of interpretation on 10/24/2021 at 10:17 am to provider Godfrey Pick , who verbally acknowledged these results. Electronically Signed   By: Lavonia Dana M.D.   On: 10/24/2021 10:17   CT MAXILLOFACIAL WO CONTRAST  Result Date: 10/24/2021 CLINICAL DATA:  Head trauma, level 2 trauma, fell down 18 steps, loss of consciousness EXAM: CT HEAD WITHOUT CONTRAST CT MAXILLOFACIAL WITHOUT CONTRAST CT CERVICAL SPINE WITHOUT CONTRAST TECHNIQUE: Multidetector CT imaging of the head, cervical spine, and maxillofacial structures were performed using the standard protocol without intravenous contrast. Multiplanar CT image reconstructions of the cervical spine and maxillofacial structures were also generated. RADIATION DOSE REDUCTION: This exam was performed according to the departmental dose-optimization program which includes automated exposure control, adjustment of the mA and/or kV according to patient size and/or use of iterative reconstruction technique. COMPARISON:  CT head 07/25/2021 FINDINGS: CT HEAD FINDINGS Brain: Generalized atrophy. Normal ventricular  morphology. No midline shift or mass effect. Small LEFT lateral frontal and frontotemporal subdural hematoma measuring 3 mm thick. No significant mass effect upon LEFT hemisphere. No intraparenchymal hemorrhage, mass lesion, or evidence of acute infarction. Vascular: No hyperdense vessels. Skull: Intact Other: N/A CT MAXILLOFACIAL FINDINGS Osseous: TMJ alignment normal bilaterally. Facial bones intact. Denture plates present. Orbits: Intraorbital soft tissue planes clear without fluid or gas Sinuses: Paranasal sinuses, mastoid air cells, and middle ear cavities clear Soft tissues: Asymmetric increased attenuation and edema of LEFT parotid gland versus RIGHT question sequela of trauma or parotitis. CT CERVICAL SPINE FINDINGS Alignment: Normal Skull base and vertebrae: Vertebral body heights maintained. Multilevel disc space narrowing and endplate spur formation. Minimal scattered facet degenerative changes. No fracture, subluxation, or bone destruction. Visualized skull base intact. Spurring from tip of odontoid process. Costal bars at posterior first through fourth ribs. Soft tissues and spinal canal: Prevertebral soft tissues normal thickness. Edema LEFT parotid gland as noted above. No regional soft tissue abnormalities otherwise seen. Disc levels: Narrowing multifactorial spinal stenosis C3-C4 by endplate spurring, bulging disc, and facet hypertrophy. Residual AP diameter of thecal sac 5 mm at midline. Less severe AP narrowing at C4-C5 and C5-C6. Upper chest: Lung  apices clear Other: N/A IMPRESSION: Generalized atrophy. Small LEFT lateral frontal and frontotemporal subdural hematoma measuring 3 mm thick without significant mass effect upon LEFT hemisphere. No acute facial bone abnormalities. Multilevel degenerative disc and facet disease changes of the cervical spine with multifactorial spinal stenosis at C3-C4 and to a lesser degree at C4-C5 and C5-C6. No acute cervical spine abnormalities. Critical  Value/emergent results were called by telephone at the time of interpretation on 10/24/2021 at 10:17 am to provider Godfrey Pick , who verbally acknowledged these results. Electronically Signed   By: Lavonia Dana M.D.   On: 10/24/2021 10:17   DG Chest Port 1 View  Result Date: 10/24/2021 CLINICAL DATA:  Trauma fell down 18 steps, laceration to head abrasion to left shoulder EXAM: PORTABLE CHEST 1 VIEW COMPARISON:  September 28, 2020 FINDINGS: The heart size and mediastinal contours are within normal limits. Stable vertical band of scarring at the left lower lung. Otherwise, lungs are clear without consolidation, pleural effusion or vascular congestion. Again seen are the multiple old healed left rib fractures. Moderate thoracic spondylosis. IMPRESSION: No active disease. Electronically Signed   By: Frazier Richards M.D.   On: 10/24/2021 09:44   DG Pelvis Portable  Result Date: 10/24/2021 CLINICAL DATA:  61 year old male status post fall down stairs. EXAM: PORTABLE PELVIS 1-2 VIEWS COMPARISON:  Postoperative pelvis x-ray 11/29/2016. CT Abdomen and Pelvis 01/29/2016. FINDINGS: Portable AP supine view at 0933 hours. Chronic left bipolar hip arthroplasty. Visible hardware appears intact and aligned. No pelvis fracture identified. Symphysis and SI joints appear stable. Negative visible lower abdominal and pelvic visceral contours. IMPRESSION: No acute fracture or dislocation identified about the pelvis. Chronic left hip arthroplasty. Electronically Signed   By: Genevie Ann M.D.   On: 10/24/2021 09:44    Antibiotics:  Anti-infectives (From admission, onward)    Start     Dose/Rate Route Frequency Ordered Stop   10/24/21 2200  metroNIDAZOLE (FLAGYL) tablet 250 mg        250 mg Oral 3 times daily 10/24/21 2033         Discharge Exam: Blood pressure 131/66, pulse (!) 55, temperature 98.7 F (37.1 C), temperature source Oral, resp. rate 17, height 6' (1.829 m), weight (!) 149.2 kg, SpO2 99 %. Neurologic: Grossly  normal, awake and alert with no pronator drift, moving all extremities   Discharge Medications:   Allergies as of 10/24/2021       Reactions   Sulfa Antibiotics Other (See Comments)   Low blood pressure   Vancomycin Itching   Give with benadryl        Medication List     STOP taking these medications    Xarelto 20 MG Tabs tablet Generic drug: rivaroxaban       TAKE these medications    Cabenuva 600 & 900 MG/3ML injection Generic drug: cabotegravir & rilpivirine ER Inject 1 kit into the muscle every 8 (eight) weeks.   clonazePAM 0.5 MG tablet Commonly known as: KLONOPIN Take 2 tablets (1 mg total) by mouth at bedtime. Follow titration instructions provided separately in writing. What changed:  how much to take additional instructions   furosemide 20 MG tablet Commonly known as: LASIX Take 1 tablet (20 mg total) by mouth daily. What changed: how much to take   gabapentin 100 MG capsule Commonly known as: NEURONTIN Take 1 capsule (100 mg total) by mouth 3 (three) times daily.   Humira Pen 80 MG/0.8ML Pnkt Generic drug: Adalimumab Inject 80 mg into the  skin once a week. Tuesday's   metroNIDAZOLE 250 MG tablet Commonly known as: FLAGYL Take 1 tablet (250 mg total) by mouth 3 (three) times daily.   olmesartan 20 MG tablet Commonly known as: BENICAR Take 1 tablet by mouth once daily        Disposition: Home   Final Dx: Closed head injury  Discharge Instructions     Call MD for:  persistant nausea and vomiting   Complete by: As directed    Call MD for:  severe uncontrolled pain   Complete by: As directed    Call MD for:  temperature >100.4   Complete by: As directed    Diet - low sodium heart healthy   Complete by: As directed    Discharge instructions   Complete by: As directed    Resume blood thinners in 5-7 days   Increase activity slowly   Complete by: As directed           Signed: Eustace Moore 10/24/2021, 11:23 PM

## 2021-10-24 NOTE — ED Notes (Signed)
Patient transported to CT by Valetta Fuller, RN

## 2021-10-25 NOTE — ED Notes (Signed)
Per Charge RN ok to discharge patient at this time with discharge instructions that were entered in for patient last night. This RN went over all paperwork and follow up care with patient prior to leaving department. Patient stable at time of discharge and wheel chair was provided to patient upon discharge. All belongings sent home with patient as well.

## 2021-10-25 NOTE — ED Notes (Signed)
This RN sent admitting provider a message in regards to patient needing discharge paperwork entered in patient's chart prior to patient being able to leave. No response from provider at this time.

## 2021-10-30 NOTE — Progress Notes (Signed)
[facility] [address] [place]. [place] [address] [phone]       OFFICE FOLLOW UP NOTE  [name] Date of Birth:  Oct 12, 1960 Medical Record Number:  [id number]   Reason for visit: Initial CPAP follow-up    SUBJECTIVE:   CHIEF COMPLAINT:  Chief Complaint  Patient presents with   Obstructive Sleep Apnea    Pt is well. Room 2 alone    HPI:   Update 10/30/2021 JM: Patient is being seen for initial CPAP compliance visit.  Completed HST 5/17 which showed moderate OSA with total AHI of 19.1/h and O2 nadir of 84%.  Recommend initiating AutoPap which was started on 6/7.  Review of initial compliance report as below.  Reports good tolerance of CPAP without any concerns. He does report occasionally falling asleep in chair prior to going to bedroom where his CPAP is. He will occasionally get up in the middle the night to go to the bathroom and forgets to put his CPAP back on.  Epworth Sleepiness Scale 4/24  Reports headaches have since subsided, remains on gabapentin 100 mg 3 times daily without side effects.  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  Does still have occasional vivid dreams or acting out dreams.  He is prescribed to take 2 tablets of 0.5 mg nightly but only takes 1 tablet per night.   He also mentions recent ED eval post fall with evidence of small SDH.  Xarelto was placed on hold, evan by neurosurgery without intervention and repeat CTH showed slight decrease in size.  He reports he was advised to f/u with our office to discuss restarting Xarelto.   No further concerns at this time     CT HEAD 10/24/2021 (10:00) IMPRESSION: Generalized atrophy.   Small LEFT lateral frontal and frontotemporal subdural hematoma measuring 3 mm thick without significant mass effect upon LEFT hemisphere.   No acute facial bone abnormalities.   Multilevel degenerative disc and facet disease changes of the cervical spine with multifactorial  spinal stenosis at C3-C4 and to a lesser degree at C4-C5 and C5-C6.   No acute cervical spine abnormalities.   CT HEAD 10/24/2021 (22:48) IMPRESSION: 1. Slight interval decrease in size of left-sided subdural hematoma, now measuring 2 mm in maximal thickness and only faintly seen overlying the left temporal convexity. No significant mass effect. 2. No other new acute intracranial abnormality. 3. Asymmetric soft tissue swelling with stranding involving the left periauricular soft tissues with a few small foci of superimposed soft tissue emphysema. Findings suspected to reflect posttraumatic soft tissue injury/contusion with laceration. 4. Asymmetric hyperattenuation involving the visualized left parotid gland. While this could be posttraumatic in nature, possible acute parotitis could also have this appearance. Correlation with physical exam recommended.          History provided for reference purposes only Update 08/07/2021 Dr. Frances Furbish:  Allen Espinoza is a 61 year old right-handed gentleman For a new problem visit of right-sided headaches.  The patient is referred by the emergency room.  I reviewed the records from 07/25/2021.  He reported a 1 month history of recurrent right-sided headaches.  He was symptomatically treated/started on gabapentin.    Work-up included a CT venogram of the head on 07/25/2021 and I reviewed the results:   IMPRESSION: 1. Negative for dural venous sinus thrombosis.   2. No acute intracranial abnormality by CT. Mild to moderate for age nonspecific white matter disease appears stable compared to brain MRI last year. He had a brain  MRI with and without contrast as well as a orbital MRI with and without contrast on 07/25/2021 and I reviewed the results: IMPRESSION: 1. No acute intracranial or orbital pathology. 2. Stable FLAIR signal abnormality in the subcortical and periventricular white matter likely reflecting sequela of chronic small vessel disease.     Laboratory testing from 07/25/2021 showed CRP of 0.7, BMP showed a glucose of 103, BUN 17, creatinine elevated at 1.5, CBC with differential showed elevated MCV at 102.8, MCH slightly elevated at 34.3, ESR mildly elevated at 55.   He was last seen in this clinic by Shawnie Dapper, NP on 02/21/2021, at which time he reported sleeping better, he was not having as many vivid dreams or acting out in his dreams.  He did report some sleep talking.  He was on clonazepam 0.5 mg each night.   He had a baseline sleep study on 05/14/2018 which did not show any significant obstructive sleep apnea.  He had an AHI of 0/h, O2 nadir was 90%.  He had movements noted during REM sleep and vocalizations, muscle tone was intermittently elevated during REM sleep.  He had mild intermittent snoring.     He has had interim weight fluctuations.    He reports an approximately 1 month history of recurrent right-sided headaches.  They can last for hours, denies any throbbing, seems more like a sharp or achy pain.  Starts in the right upper eye area above the eyebrow and tends to stay there, does not radiate or shoot.  He does not have any radiating neck pain.  Denies any associated photophobia or nausea or vomiting.  He has prescription eyeglasses but has not had an eye examination in 1-1/2 to 2 years.  He does need to find a new doctor as his doctor accepts his insurance as I understand.  He has been using BC powder, up to 3 a day.  He does snore.  He has tried the gabapentin 300 mg 3 times daily for about 3 days per emergency room prescription.  It probably helped a little bit as he recalls.  He no longer has a prescription.  He continues to take clonazepam 0.5 mg strength 1 pill at night.  He sleeps without any major dream enactment behavior.  He has had no recent focal neurological symptoms.     Update 02/21/21 ALL:  Allen Espinoza is a 61 y.o. male here today for follow up for RBD. He was last seen by Dr Frances Furbish 10/2020. He had been  off clonazepam for about 2 years as he did not feel it was effective. He reported vivid dreams had significantly worsened over this period of time. He was having more movements/enactment of dreams. He was advised to restart clonazepam 0.5mg  and titrate to 1mg  daily at bedtime. He reports sleeping better. He is no longer acting out dreams. He continues to sleep in separate bedroom from his wife. She has told him that he does talk in his sleep but has not seen any restless behavior. He was not able to tolerate 1mg  and has continued 0.5mg  daily. He is tolerting it well. Last refill 02/13/21 for 60 tablets. He does have intermittent numbness/tingling of his head. It can be right or left sided and occurs in different places each time. Symptoms do seem better and less frequent since last visit.      HISTORY (copied from Dr Teofilo Pod previous note)   Mr. Szafran is a 61 year old right-handed gentleman with an underlying complex medical history of  arthritis, avascular necrosis of the left femoral head, cholelithiasis, chronic kidney disease, diverticulosis, HIV, history of DVT, and morbid obesity with BMI of over 40, who presents for follow-up consultation of his sleep disorder, in particular, vivid dreams and dream enactment behaviors, in keeping with RBD.  The patient is unaccompanied today and presents after a longer gap of nearly 2 years.  I last saw him on 12/22/2018, at which time he reported that clonazepam was not effective and he had stopped using it.  He had no repercussions after stopping it.  He was advised to follow-up as needed.  He is referred by his primary care physician for reevaluation.   Today, 11/01/2020: He reports that his vivid dreams have become worse in the last few months.  Per fianc, he has had more movements and acting out in his dreams.  They typically do not sleep in the same bed together.  He snores to some degree.  He denies waking up with a gasping sensation or having apneas that are  witnessed.  He has been on an injectable medication for his HIV.  He has had intermittent tingling around his head.  He describes a zap like sensation, no actual headaches, denies any one-sided weakness or numbness or tingling or droopy face or slurring of speech otherwise.  He had a brain MRI without contrast in June 2020 which was reassuring.  He overall feels clumsy, balance is not as good but has not had any falls.  He would be willing to try clonazepam again.  He was on 0.5 mg at bedtime when he started due to not being effective.  He had also tried melatonin in the past when we first started treatment for his REM behavior disorder.      ROS:   14 system review of systems performed and negative with exception of those listed in HPI  PMH:  Past Medical History:  Diagnosis Date   Abscess    Between legs   Arthritis    all over   Asthma    Avascular necrosis of femoral head, left (HCC)    Blood transfusion without reported diagnosis    Cholelithiasis    CKD (chronic kidney disease) stage 3, GFR 30-59 ml/min (HCC)    sees kidney Dr.   [place] disorder (HCC)    left DVT   Diverticulosis    DVT (deep venous thrombosis) (HCC)    legs   Dyspnea    when walking   Dysrhythmia    remembers mother taking about having an irregular rhythm years when he was a child    GERD (gastroesophageal reflux disease)    HIV (human immunodeficiency virus infection) (HCC)    Hypertension    Morbid obesity (HCC)     PSH:  Past Surgical History:  Procedure Laterality Date   COLONOSCOPY  2019   DENTAL SURGERY     had teeth pulled   HYDRADENITIS EXCISION Left 10/14/2016   Procedure: WIDE EXCISION HIDRADENITIS LEFT AXILLA;  Surgeon: Abigail Miyamoto, MD;  Location: [facility] OR;  Service: General;  Laterality: Left;   JOINT REPLACEMENT     Left hip Dr. Magnus Ivan 11/29/16   TOTAL HIP ARTHROPLASTY Left 11/29/2016   Procedure: LEFT TOTAL HIP ARTHROPLASTY ANTERIOR APPROACH;  Surgeon: Kathryne Hitch,  MD;  Location: [facility] ORS;  Service: Orthopedics;  Laterality: Left;    Social History:  Social History   Socioeconomic History   Marital status: Single    Spouse name: Not on file   Number  of children: 2   Years of education: Not on file   Highest education level: Not on file  Occupational History   Occupation: Disability  Tobacco Use   Smoking status: Never   Smokeless tobacco: Never  Vaping Use   Vaping Use: Never used  Substance and Sexual Activity   Alcohol use: No    Comment: prior   Drug use: No    Comment: prior cocaine use, last 2005   Sexual activity: Not Currently    Partners: Female    Birth control/protection: Condom    Comment: declined condoms 05/2020  Other Topics Concern   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, mailroom, warehouse work   Lives w/ fiancee   No EtOH, tobacco, drugs   Social Determinants of Health   Financial Resource Strain: High Risk (12/10/2017)   Overall Financial Resource Strain (CARDIA)    Difficulty of Paying Living Expenses: Hard  Food Insecurity: Food Insecurity Present (12/10/2017)   Hunger Vital Sign    Worried About Running Out of Food in the Last Year: Sometimes true    Ran Out of Food in the Last Year: Sometimes true  Transportation Needs: No Transportation Needs (12/10/2017)   PRAPARE - Administrator, Civil Service (Medical): No    Lack of Transportation (Non-Medical): No  Physical Activity: Insufficiently Active (12/10/2017)   Exercise Vital Sign    Days of Exercise per Week: 2 days    Minutes of Exercise per Session: 10 min  Stress: No Stress Concern Present (12/10/2017)   [employer] of Occupational Health - Occupational Stress Questionnaire    Feeling of Stress : Only a little  Social Connections: Somewhat Isolated (12/10/2017)   Social Connection and Isolation Panel [NHANES]    Frequency of Communication with Friends and Family: Three times a week    Frequency of  Social Gatherings with Friends and Family: More than three times a week    Attends Religious Services: Never    Database administrator or Organizations: No    Attends Banker Meetings: Never    Marital Status: Living with partner  Intimate Partner Violence: Not At Risk (12/10/2017)   Humiliation, Afraid, Rape, and Kick questionnaire    Fear of Current or Ex-Partner: No    Emotionally Abused: No    Physically Abused: No    Sexually Abused: No    Family History:  Family History  Problem Relation Age of Onset   Heart attack Mother 3   Heart attack Father 53   Prostate cancer Brother    Bone cancer Maternal Aunt    Breast cancer Maternal Aunt    Colon cancer Neg Hx    Esophageal cancer Neg Hx    Stomach cancer Neg Hx    Rectal cancer Neg Hx    Migraines Neg Hx    Headache Neg Hx     Medications:   Current Outpatient Medications on File Prior to Visit  Medication Sig Dispense Refill   cabotegravir & rilpivirine ER (CABENUVA) 600 & 900 MG/3ML injection Inject 1 kit into the muscle every 8 (eight) weeks. 6 mL 5   clonazePAM (KLONOPIN) 0.5 MG tablet Take 2 tablets (1 mg total) by mouth at bedtime. Follow titration instructions provided separately in writing. (Patient taking differently: Take 0.5 mg by mouth at bedtime.) 60 tablet 3   furosemide (LASIX) 20 MG tablet Take 1 tablet (20 mg total) by mouth daily. (Patient taking  differently: Take 40 mg by mouth daily.) 90 tablet 0   gabapentin (NEURONTIN) 100 MG capsule Take 1 capsule (100 mg total) by mouth 3 (three) times daily. 90 capsule 5   HUMIRA PEN 80 MG/0.8ML PNKT Inject 80 mg into the skin once a week. Tuesday's     metroNIDAZOLE (FLAGYL) 250 MG tablet Take 1 tablet (250 mg total) by mouth 3 (three) times daily. 270 tablet 3   olmesartan (BENICAR) 20 MG tablet Take 1 tablet by mouth once daily 90 tablet 3   No current facility-administered medications on file prior to visit.    Allergies:   Allergies  Allergen  Reactions   Sulfa Antibiotics Other (See Comments)    Low blood pressure   Vancomycin Itching    Give with benadryl      OBJECTIVE:  Physical Exam  Vitals:   10/31/21 0732  BP: (!) 150/71  Pulse: (!) 48  Weight: (!) 334 lb 8 oz (151.7 kg)  Height: 6' (1.829 m)   Body mass index is 45.37 kg/m. No results found.  General: well developed, well nourished, very pleasant middle-aged African-American male, seated, in no evident distress Head: head normocephalic and atraumatic.   Neck: supple with no carotid or supraclavicular bruits Cardiovascular: regular rate and rhythm, no murmurs Musculoskeletal: no deformity Skin:  no rash/petichiae Vascular:  Normal pulses all extremities   Neurologic Exam Mental Status: Awake and fully alert. Oriented to place and time. Recent and remote memory intact. Attention span, concentration and fund of knowledge appropriate. Mood and affect appropriate.  Cranial Nerves: Pupils equal, briskly reactive to light. Extraocular movements full without nystagmus. Visual fields full to confrontation. Hearing intact. Facial sensation intact. Face, tongue, palate moves normally and symmetrically.  Motor: Normal bulk and tone. Normal strength in all tested extremity muscles Sensory.: intact to touch , pinprick , position and vibratory sensation.  Coordination: Rapid alternating movements normal in all extremities. Finger-to-nose and heel-to-shin performed accurately bilaterally. Gait and Station: Arises from chair without difficulty. Stance is normal. Gait demonstrates normal stride length and balance without use of AD. Tandem walk and heel toe without difficulty.  Reflexes: 1+ and symmetric. Toes downgoing.         ASSESSMENT: TIOFILO SHOW is a 61 y.o. 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 Also recent ED visit for post fall SDH.       PLAN:  OSA on CPAP : Discussed importance of increasing nightly compliance and ensuring greater than 4 hours per night.  Continue current AutoPap settings as currently managing apnea well.  Advised to continue to follow with DME company for any needed supplies or CPAP related concerns. Right-sided headaches: now resolved. 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  This included previsit chart review including review of recent hospitalization, lab review, study review, order entry, electronic health record documentation, patient education regarding diagnosis of sleep apnea with review and  discussion of compliance report, right-sided headaches and current treatment plan and REM behavior disorder as noted above, recent traumatic SDH and follow-up imaging and answered all other questions to patient's satisfaction   Ihor Austin, [facility]  [facility] [name] [place], [place] [address]  Phone [phone] Fax [phone] Note: This document was prepared with digital dictation and possible smart phrase technology. Any transcriptional errors that result from this process are unintentional.

## 2021-10-31 ENCOUNTER — Encounter: Payer: Self-pay | Admitting: Adult Health

## 2021-10-31 ENCOUNTER — Ambulatory Visit: Payer: Medicaid Other | Admitting: Adult Health

## 2021-10-31 VITALS — BP 150/71 | HR 48 | Ht 72.0 in | Wt 334.5 lb

## 2021-10-31 DIAGNOSIS — Z9989 Dependence on other enabling machines and devices: Secondary | ICD-10-CM

## 2021-10-31 DIAGNOSIS — S065XAA Traumatic subdural hemorrhage with loss of consciousness status unknown, initial encounter: Secondary | ICD-10-CM | POA: Diagnosis not present

## 2021-10-31 DIAGNOSIS — G4733 Obstructive sleep apnea (adult) (pediatric): Secondary | ICD-10-CM

## 2021-10-31 DIAGNOSIS — G4752 REM sleep behavior disorder: Secondary | ICD-10-CM

## 2021-10-31 NOTE — Patient Instructions (Signed)
We will continue current CPAP settings, please increase nightly use of CPAP with ensuring greater than 4 hours per night as well as with any daytime napping  Continue clonazepam for REM sleep disorder - can try to take 2 tablets as currently prescribed as you still have vivid dreams and acting out dreams  Continue gabapentin 100 mg 3 times daily for headache prevention - if this remains stable at next visit, we can discuss slowly decreasing/weaning off  You will be called to repeat a CT head to ensure stability/improvement of recent subdural hematoma. Based on results, may be able to restart Xarelto    Follow-up in 6 months or call earlier if needed

## 2021-11-01 DIAGNOSIS — N183 Chronic kidney disease, stage 3 unspecified: Secondary | ICD-10-CM | POA: Diagnosis not present

## 2021-11-05 ENCOUNTER — Telehealth: Payer: Self-pay | Admitting: Adult Health

## 2021-11-05 DIAGNOSIS — G4733 Obstructive sleep apnea (adult) (pediatric): Secondary | ICD-10-CM | POA: Diagnosis not present

## 2021-11-05 NOTE — Telephone Encounter (Signed)
[employer]: [id number] exp. 11/05/21-01/03/22 sent to [facility]

## 2021-11-06 DIAGNOSIS — N183 Chronic kidney disease, stage 3 unspecified: Secondary | ICD-10-CM | POA: Diagnosis not present

## 2021-11-06 DIAGNOSIS — I82409 Acute embolism and thrombosis of unspecified deep veins of unspecified lower extremity: Secondary | ICD-10-CM | POA: Diagnosis not present

## 2021-11-06 DIAGNOSIS — B20 Human immunodeficiency virus [HIV] disease: Secondary | ICD-10-CM | POA: Diagnosis not present

## 2021-11-06 DIAGNOSIS — I129 Hypertensive chronic kidney disease with stage 1 through stage 4 chronic kidney disease, or unspecified chronic kidney disease: Secondary | ICD-10-CM | POA: Diagnosis not present

## 2021-11-06 DIAGNOSIS — L732 Hidradenitis suppurativa: Secondary | ICD-10-CM | POA: Diagnosis not present

## 2021-11-12 ENCOUNTER — Telehealth: Payer: Self-pay | Admitting: Adult Health

## 2021-11-12 ENCOUNTER — Encounter (HOSPITAL_COMMUNITY): Payer: Self-pay | Admitting: Emergency Medicine

## 2021-11-12 ENCOUNTER — Other Ambulatory Visit: Payer: Self-pay

## 2021-11-12 ENCOUNTER — Observation Stay (HOSPITAL_COMMUNITY): Payer: Medicaid Other

## 2021-11-12 ENCOUNTER — Observation Stay (HOSPITAL_COMMUNITY)
Admission: EM | Admit: 2021-11-12 | Discharge: 2021-11-13 | Disposition: A | Discharge status: Home / Self Care | Payer: Medicaid Other | Attending: Internal Medicine | Admitting: Internal Medicine

## 2021-11-12 ENCOUNTER — Observation Stay (HOSPITAL_COMMUNITY)
Admission: RE | Admit: 2021-11-12 | Discharge: 2021-11-12 | Disposition: A | Discharge status: Home / Self Care | Payer: Medicaid Other | Source: Ambulatory Visit | Attending: Adult Health | Admitting: Adult Health

## 2021-11-12 DIAGNOSIS — Z86718 Personal history of other venous thrombosis and embolism: Secondary | ICD-10-CM | POA: Insufficient documentation

## 2021-11-12 DIAGNOSIS — Z79899 Other long term (current) drug therapy: Secondary | ICD-10-CM | POA: Diagnosis not present

## 2021-11-12 DIAGNOSIS — W109XXA Fall (on) (from) unspecified stairs and steps, initial encounter: Secondary | ICD-10-CM | POA: Diagnosis not present

## 2021-11-12 DIAGNOSIS — N183 Chronic kidney disease, stage 3 unspecified: Secondary | ICD-10-CM

## 2021-11-12 DIAGNOSIS — S065X0A Traumatic subdural hemorrhage without loss of consciousness, initial encounter: Principal | ICD-10-CM | POA: Insufficient documentation

## 2021-11-12 DIAGNOSIS — I129 Hypertensive chronic kidney disease with stage 1 through stage 4 chronic kidney disease, or unspecified chronic kidney disease: Secondary | ICD-10-CM | POA: Diagnosis not present

## 2021-11-12 DIAGNOSIS — N1831 Chronic kidney disease, stage 3a: Secondary | ICD-10-CM | POA: Diagnosis not present

## 2021-11-12 DIAGNOSIS — B20 Human immunodeficiency virus [HIV] disease: Secondary | ICD-10-CM | POA: Diagnosis not present

## 2021-11-12 DIAGNOSIS — S065XAA Traumatic subdural hemorrhage with loss of consciousness status unknown, initial encounter: Secondary | ICD-10-CM | POA: Insufficient documentation

## 2021-11-12 DIAGNOSIS — I10 Essential (primary) hypertension: Secondary | ICD-10-CM | POA: Diagnosis not present

## 2021-11-12 DIAGNOSIS — Z7901 Long term (current) use of anticoagulants: Secondary | ICD-10-CM | POA: Insufficient documentation

## 2021-11-12 DIAGNOSIS — I82409 Acute embolism and thrombosis of unspecified deep veins of unspecified lower extremity: Secondary | ICD-10-CM

## 2021-11-12 DIAGNOSIS — I6200 Nontraumatic subdural hemorrhage, unspecified: Secondary | ICD-10-CM | POA: Diagnosis not present

## 2021-11-12 DIAGNOSIS — Z96641 Presence of right artificial hip joint: Secondary | ICD-10-CM | POA: Diagnosis not present

## 2021-11-12 DIAGNOSIS — S0990XA Unspecified injury of head, initial encounter: Secondary | ICD-10-CM | POA: Diagnosis present

## 2021-11-12 DIAGNOSIS — J45909 Unspecified asthma, uncomplicated: Secondary | ICD-10-CM | POA: Diagnosis not present

## 2021-11-12 DIAGNOSIS — K219 Gastro-esophageal reflux disease without esophagitis: Secondary | ICD-10-CM

## 2021-11-12 DIAGNOSIS — N179 Acute kidney failure, unspecified: Secondary | ICD-10-CM | POA: Insufficient documentation

## 2021-11-12 LAB — CBC WITH DIFFERENTIAL/PLATELET
Abs Immature Granulocytes: 0.03 10*3/uL (ref 0.00–0.07)
Basophils Absolute: 0 10*3/uL (ref 0.0–0.1)
Basophils Relative: 0 %
Eosinophils Absolute: 0.2 10*3/uL (ref 0.0–0.5)
Eosinophils Relative: 2 %
HCT: 45.3 % (ref 39.0–52.0)
Hemoglobin: 14.8 g/dL (ref 13.0–17.0)
Immature Granulocytes: 0 %
Lymphocytes Relative: 33 %
Lymphs Abs: 2.9 10*3/uL (ref 0.7–4.0)
MCH: 33.9 pg (ref 26.0–34.0)
MCHC: 32.7 g/dL (ref 30.0–36.0)
MCV: 103.7 fL — ABNORMAL HIGH (ref 80.0–100.0)
Monocytes Absolute: 0.7 10*3/uL (ref 0.1–1.0)
Monocytes Relative: 8 %
Neutro Abs: 5.1 10*3/uL (ref 1.7–7.7)
Neutrophils Relative %: 57 %
Platelets: 160 10*3/uL (ref 150–400)
RBC: 4.37 MIL/uL (ref 4.22–5.81)
RDW: 12.7 % (ref 11.5–15.5)
WBC: 8.9 10*3/uL (ref 4.0–10.5)
nRBC: 0 % (ref 0.0–0.2)

## 2021-11-12 LAB — COMPREHENSIVE METABOLIC PANEL
ALT: 13 U/L (ref 0–44)
AST: 17 U/L (ref 15–41)
Albumin: 3.7 g/dL (ref 3.5–5.0)
Alkaline Phosphatase: 72 U/L (ref 38–126)
Anion gap: 7 (ref 5–15)
BUN: 22 mg/dL (ref 8–23)
CO2: 26 mmol/L (ref 22–32)
Calcium: 8.8 mg/dL — ABNORMAL LOW (ref 8.9–10.3)
Chloride: 107 mmol/L (ref 98–111)
Creatinine, Ser: 1.8 mg/dL — ABNORMAL HIGH (ref 0.61–1.24)
GFR, Estimated: 42 mL/min — ABNORMAL LOW (ref >60)
Glucose, Bld: 125 mg/dL — ABNORMAL HIGH (ref 70–99)
Potassium: 3.7 mmol/L (ref 3.5–5.1)
Sodium: 140 mmol/L (ref 135–145)
Total Bilirubin: 0.5 mg/dL (ref 0.3–1.2)
Total Protein: 8.8 g/dL — ABNORMAL HIGH (ref 6.5–8.1)

## 2021-11-12 LAB — PROTIME-INR
INR: 1.1 (ref 0.8–1.2)
Prothrombin Time: 14 seconds (ref 11.4–15.2)

## 2021-11-12 MED ORDER — SODIUM CHLORIDE 0.9% FLUSH
3.0000 mL | Freq: Two times a day (BID) | INTRAVENOUS | Status: DC
Start: 2021-11-12 — End: 2021-11-13
  Administered 2021-11-12 – 2021-11-13 (×2): 3 mL via INTRAVENOUS

## 2021-11-12 MED ORDER — ACETAMINOPHEN 325 MG PO TABS: 650.0000 mg | ORAL_TABLET | Freq: Four times a day (QID) | ORAL | Status: DC | PRN

## 2021-11-12 MED ORDER — ACETAMINOPHEN 650 MG RE SUPP: 650.0000 mg | Freq: Four times a day (QID) | RECTAL | Status: DC | PRN

## 2021-11-12 MED ORDER — CLONAZEPAM 0.5 MG PO TABS
1.0000 mg | ORAL_TABLET | Freq: Every day | ORAL | Status: DC
  Administered 2021-11-12: 1 mg via ORAL
  Filled 2021-11-12: qty 2

## 2021-11-12 MED ORDER — AMLODIPINE BESYLATE 5 MG PO TABS
5.0000 mg | ORAL_TABLET | Freq: Every day | ORAL | Status: DC
  Administered 2021-11-13: 5 mg via ORAL
  Filled 2021-11-12: qty 1

## 2021-11-12 MED ORDER — CLONAZEPAM 0.5 MG PO TABS: 0.5000 mg | ORAL_TABLET | Freq: Every evening | ORAL | Status: DC | PRN

## 2021-11-12 MED ORDER — METRONIDAZOLE 500 MG PO TABS
250.0000 mg | ORAL_TABLET | Freq: Three times a day (TID) | ORAL | Status: DC
  Administered 2021-11-12 – 2021-11-13 (×2): 250 mg via ORAL
  Filled 2021-11-12 (×2): qty 1

## 2021-11-12 MED ORDER — GABAPENTIN 100 MG PO CAPS
100.0000 mg | ORAL_CAPSULE | Freq: Three times a day (TID) | ORAL | Status: DC
  Administered 2021-11-12 – 2021-11-13 (×2): 100 mg via ORAL
  Filled 2021-11-12 (×2): qty 1

## 2021-11-12 MED ORDER — ONDANSETRON HCL 4 MG PO TABS: 4.0000 mg | ORAL_TABLET | Freq: Four times a day (QID) | ORAL | Status: DC | PRN

## 2021-11-12 MED ORDER — HYDRALAZINE HCL 20 MG/ML IJ SOLN: 10.0000 mg | Freq: Four times a day (QID) | INTRAMUSCULAR | Status: DC | PRN

## 2021-11-12 MED ORDER — SODIUM CHLORIDE 0.9 % IV SOLN
250.0000 mL | INTRAVENOUS | Status: DC | PRN
Start: 2021-11-12 — End: 2021-11-13

## 2021-11-12 MED ORDER — SODIUM CHLORIDE 0.9% FLUSH: 3.0000 mL | INTRAVENOUS | Status: DC | PRN

## 2021-11-12 MED ORDER — LACTATED RINGERS IV BOLUS
1000.0000 mL | Freq: Once | INTRAVENOUS | Status: AC
  Administered 2021-11-12: 1000 mL via INTRAVENOUS

## 2021-11-12 MED ORDER — CABOTEGRAVIR & RILPIVIRINE ER 600 & 900 MG/3ML IM SUER
1.0000 | INTRAMUSCULAR | Status: DC
Start: 2021-11-12 — End: 2021-11-13

## 2021-11-12 MED ORDER — SENNOSIDES-DOCUSATE SODIUM 8.6-50 MG PO TABS: 1.0000 | ORAL_TABLET | Freq: Every evening | ORAL | Status: DC | PRN

## 2021-11-12 MED ORDER — ONDANSETRON HCL 4 MG/2ML IJ SOLN: 4.0000 mg | Freq: Four times a day (QID) | INTRAMUSCULAR | Status: DC | PRN

## 2021-11-12 NOTE — H&P (Addendum)
History and Physical  SANEL STEMMER LPF:[id number] DOB: July 06, 1960 DOA: 11/12/2021  PCP: Teaching Service  Patient coming from: Home   I have personally briefly reviewed patient's old medical records in [place]   Chief Complaint: Abnormal CT scan, Headaches.   HPI: Allen Espinoza is a 61 y.o. male primary past medical history significant for CKD stage IIIa, creatinine baseline 1.5, history of DVT 2010 was on Xarelto, asthma, HIV, morbid obesity, sleep apnea who presents for further evaluation of subdural hematoma.  Patient fell out of 18 steps and hit his head on cement floor on July 2026.  He was admitted overnight and a repeated CT scan documented stability of the left side subdural hematoma.  He was advised to discontinue Xarelto at that time.  Patient went to see his neurologist Dr. Brett Fairy for his sleep apnea and to discuss when to resume Xarelto.  For this reason a CT head was done today which showed 9 mm mixed density right cerebral convexity subdural hematoma new since 10/24/2021 with hyperdensity likely reflecting acute to subacute blood products.  There is no significant mass effect on the underlying brain parenchyma or midline shift.  Previously seen small cerebral convexity subdural hematoma has resolved.  Patient was referred to the emergency department by his neurology for further observation and evaluation.  ED physician discussed with PA with neurosurgeon Reinaldo Meeker  who recommended admission to [facility] and repeat a CT scan tomorrow morning.  Patient currently denies any headache, focal weakness, he reported mild tingling in his head, denies vision changes, focal deficits No recent fall after his initial fall in October 24, 2021.   Review of Systems: All systems reviewed and apart from history of presenting illness, are negative.  Past Medical History:  Diagnosis Date   Abscess    Between legs   Arthritis    all over   Asthma    Avascular necrosis of  femoral head, left (HCC)    Blood transfusion without reported diagnosis    Cholelithiasis    CKD (chronic kidney disease) stage 3, GFR 30-59 ml/min (HCC)    sees kidney Dr.   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[place])    left DVT   Diverticulosis    DVT (deep venous thrombosis) (HCC)    legs   Dyspnea    when walking   Dysrhythmia    remembers mother taking about having an irregular rhythm years when he was a child    GERD (gastroesophageal reflux disease)    HIV (human immunodeficiency virus infection) ([place])    Hypertension    Morbid obesity ([place])    Past Surgical History:  Procedure Laterality Date   COLONOSCOPY  2019   DENTAL SURGERY     had teeth pulled   HYDRADENITIS EXCISION Left 10/14/2016   Procedure: WIDE EXCISION HIDRADENITIS LEFT AXILLA;  Surgeon: Coralie Keens, MD;  Location: [facility];  Service: General;  Laterality: Left;   JOINT REPLACEMENT     Left hip Dr. Ninfa Linden 11/29/16   TOTAL HIP ARTHROPLASTY Left 11/29/2016   Procedure: LEFT TOTAL HIP ARTHROPLASTY ANTERIOR APPROACH;  Surgeon: [name], MD;  Location: [facility];  Service: Orthopedics;  Laterality: Left;   Social History:  reports that he has never smoked. He has never used smokeless tobacco. He reports that he does not drink alcohol and does not use drugs.   Allergies  Allergen Reactions   Sulfa Antibiotics Other (See Comments)    Low blood pressure   Vancomycin  Itching    Give with benadryl    Family History  Problem Relation Age of Onset   Heart attack Mother 70   Heart attack Father 71   Prostate cancer Brother    Bone cancer Maternal Aunt    Breast cancer Maternal Aunt    Colon cancer Neg Hx    Esophageal cancer Neg Hx    Stomach cancer Neg Hx    Rectal cancer Neg Hx    Migraines Neg Hx    Headache Neg Hx     Prior to Admission medications   Medication Sig Start Date End Date Taking? Authorizing Provider  cabotegravir & rilpivirine ER (CABENUVA) 600 & 900 MG/3ML injection Inject 1 kit into  the muscle every 8 (eight) weeks. 06/11/21 06/11/22  Kuppelweiser, [name], RPH-CPP  clonazePAM (KLONOPIN) 0.5 MG tablet Take 2 tablets (1 mg total) by mouth at bedtime. Follow titration instructions provided separately in writing. Patient taking differently: Take 0.5 mg by mouth at bedtime. 11/01/20   [name], MD  furosemide (LASIX) 20 MG tablet Take 1 tablet (20 mg total) by mouth daily. Patient taking differently: Take 40 mg by mouth daily. 04/09/21   Delene Ruffini, MD  gabapentin (NEURONTIN) 100 MG capsule Take 1 capsule (100 mg total) by mouth 3 (three) times daily. 08/07/21   [name], MD  HUMIRA PEN 80 MG/0.8ML PNKT Inject 80 mg into the skin once a week. Tuesday's 08/08/20   [provider]  metroNIDAZOLE (FLAGYL) 250 MG tablet Take 1 tablet (250 mg total) by mouth 3 (three) times daily. 05/11/21   [name], MD  olmesartan [facility]) 20 MG tablet Take 1 tablet by mouth once daily 05/11/21   [name], MD   Physical Exam: Vitals:   11/12/21 1500 11/12/21 1512 11/12/21 1530 11/12/21 1600  BP: (!) 149/62  90/76 (!) 126/58  Pulse: 79 (!) 57 69 (!) 57  Resp: 16 18 (!) 28 19  Temp: 98.2 F (36.8 C)     TempSrc: Oral     SpO2: 100% 99% 100% 100%    General exam: Moderately built and nourished patient, lying comfortably supine on the gurney in no obvious distress. Head, eyes and ENT: Nontraumatic and normocephalic. Pupils equally reacting to light and accommodation. Oral mucosa moist. Neck: Supple. No JVD, carotid bruit or thyromegaly. Lymphatics: No lymphadenopathy. Respiratory system: Clear to auscultation. No increased work of breathing. Cardiovascular system: S1 and S2 heard, RRR. No JVD, murmurs, gallops, clicks or pedal edema. Gastrointestinal system: Abdomen is nondistended, soft and nontender. Normal bowel sounds heard. No organomegaly or masses appreciated. Central nervous system: Alert and oriented. No focal neurological deficits. Extremities:  Symmetric 5 x 5 power. Peripheral pulses symmetrically felt.  Skin: No rashes or acute findings. Musculoskeletal system: Negative exam. Psychiatry: Pleasant and cooperative.   Labs on Admission:  Basic Metabolic Panel: Recent Labs  Lab 11/12/21 1514  NA 140  K 3.7  CL 107  CO2 26  GLUCOSE 125*  BUN 22  CREATININE 1.80*  CALCIUM 8.8*   Liver Function Tests: Recent Labs  Lab 11/12/21 1514  AST 17  ALT 13  ALKPHOS 72  BILITOT 0.5  PROT 8.8*  ALBUMIN 3.7   No results for input(s): "LIPASE", "AMYLASE" in the last 168 hours. No results for input(s): "AMMONIA" in the last 168 hours. CBC: Recent Labs  Lab 11/12/21 1514  WBC 8.9  NEUTROABS 5.1  HGB 14.8  HCT 45.3  MCV 103.7*  PLT 160  Cardiac Enzymes: No results for input(s): "CKTOTAL", "CKMB", "CKMBINDEX", "TROPONINI" in the last 168 hours.  BNP (last 3 results) No results for input(s): "PROBNP" in the last 8760 hours. CBG: No results for input(s): "GLUCAP" in the last 168 hours.  Radiological Exams on Admission: CT HEAD WO CONTRAST (5MM)  Result Date: 11/12/2021 CLINICAL DATA:  Subdural hematoma EXAM: CT HEAD WITHOUT CONTRAST TECHNIQUE: Contiguous axial images were obtained from the base of the skull through the vertex without intravenous contrast. RADIATION DOSE REDUCTION: This exam was performed according to the departmental dose-optimization program which includes automated exposure control, adjustment of the mA and/or kV according to patient size and/or use of iterative reconstruction technique. COMPARISON:  CT head 10/24/2021 FINDINGS: Brain: There is a mixed density right cerebral convexity subdural hematoma measuring up to 9 mm in maximal thickness in the coronal plane, not seen on the study from 10/24/2021. There are areas of relative hyperdensity layering dependently within the collection suggesting acute to subacute blood products. There is no significant mass effect on the underlying brain parenchyma and no  midline shift. The previously seen left cerebral convexity subdural hematoma has resolved. There is no evidence of acute infarct. Parenchymal volume is stable. The ventricles are stable in size. Patchy hypodensity in the supratentorial white matter likely reflecting sequela of mild chronic white matter microangiopathy is unchanged There is no mass lesion. Vascular: No hyperdense vessel or unexpected calcification. Skull: Normal. Negative for fracture or focal lesion. Sinuses/Orbits: The imaged paranasal sinuses are clear. The globes and orbits are unremarkable. Other: None. IMPRESSION: 1. 9 mm mixed density right cerebral convexity subdural hematoma is new since 10/24/2021 with hyperdensity likely reflecting acute to subacute blood products. There is no significant mass effect on the underlying brain parenchyma or midline shift. 2. The previously seen small left cerebral convexity subdural hematoma has resolved. These results were called by telephone at the time of interpretation on 11/12/2021 at 11:51 am to provider [facility] , who verbally acknowledged these results. Electronically Signed   By: Valetta Mole M.D.   On: 11/12/2021 11:52    EKG: Independently reviewed. Sinus Rhythm./   Assessment/Plan Principal Problem:   Subdural hematoma (HCC) Active Problems:   HIV (human immunodeficiency virus infection) (HCC)   DVT (deep venous thrombosis) (HCC)   CKD (chronic kidney disease) stage 3, GFR 30-59 ml/min (HCC)   Morbid obesity (HCC)   Asthma   GERD (gastroesophageal reflux disease)   1-Subdural hematoma on the right.  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  At that time Xarelto was discontinued repeated CT head documented stability of the left-sided subdural hematoma. He denies any headache, no focal neurological deficits currently. He has not fallen again since prior fall with July/26. CT  head done today to evaluate when Xarelto was going  be resume Admit to [facility].  Neurosurgery consulted, Dr Ronnald Ramp and Maudie Mercury will see patient per NP [facility]. Follow recommendation in regards if patient needs MRI brain.  Needs CT head tomorrow Am.  Avoid antiplatelet and anticoagulation.   2-AKI on CKD stage IIIa: Cr.  Baseline 1.5 Present with a creatinine of 1.8 History of 1 L of IV fluids in the ED Plan to hold Lasix and Benicar.   3-HTN;  Hold lasix and benicar due to AKI.  PRN hydralazine Start Norvasc   4-HIV; on Cabenuva IM every 8 weeks  5-Right-sided headache: Resolved.  Continue with gabapentin 100 mg 3 times daily  REM behavior disorder: Continue with clonazepam  OSA on CPAP resume  History of DVT; holding Xarelto.    DVT Prophylaxis: SCD Code Status: Full Code Family Communication: Care discussed with patient.  Disposition Plan: Care discussed with patient and family at bedside.   Time spent: 75 minutes   Elmarie Shiley MD [facility]   11/12/2021, 4:15 PM

## 2021-11-12 NOTE — ED Triage Notes (Signed)
Pt reports falling x 2 weeks ago. Pt reports getting a call today stating he had a bleed on his head and needed to go to the ER. Pt was taking blood thinners when he fell.

## 2021-11-12 NOTE — ED Notes (Signed)
Report given to [facility] 

## 2021-11-12 NOTE — ED Notes (Signed)
[facility] called for transport. 

## 2021-11-12 NOTE — Telephone Encounter (Signed)
Called patient back regarding Dr. Edwena Felty recommendations. No answer but did leave message advising him to proceed to ED for further evaluation and close monitoring. Did request patient call back to confirm that message was received as well as if he has any questions regarding these recommendations. If patient calls back, please advise him of the above. If patient does not call back by 3pm, please call patient reiterating these recommendations.  Thank you.

## 2021-11-12 NOTE — Telephone Encounter (Signed)
Called patient regarding CT head completed today which showed 9 mm mixed density right cerebral convexity SDH new from prior imaging on 7/26 with hypodensity likely reflecting acute to subacute blood products. Noted no significant mass effect on underlying brain parenchyma or midline shift. Prior left cerebral convexity SDH resolved.  Patient was seen in office 8/2, did have a fall on 7/26 which resulted in SDH. He was doing well at office visit. Denies having any recurrent falls since 7/26.  Denies any headache, N/V, mental status changes, dizziness, drowsiness, visual changes, weakness or any other new stroke/TIA symptoms.   Will send to work in MD for further input and recommendations.

## 2021-11-12 NOTE — Progress Notes (Signed)
Subjective:   Summary: Allen Espinoza is a 61 y.o. year old male currently admitted on the IMTS HD#0 for R SDH.  Overnight Events:  - Briefly, patient was admitted for observation on 10/24/2021 following a fall was found to have a left frontotemporal SDH.  He was on Xarelto at that time.  He was discharged soon after with Xarelto held and instructed to follow-up with neurology.  He has had no interval falls and no new symptoms including headache, vision changes, focal neurological weakness or numbness.  He does endorse some mild, chronic balance issues and attributes them to poor mobility.  He had a second CT this morning after neurology follow-up for consideration of restarting Xarelto and a new small right frontotemporal SDH was visualized on the scan.  He was instructed to return to [facility] long ED and was transferred to [facility] for observation of SDH and evaluation by our neurosurgery team.  He was supposed to get a second CT tomorrow morning, but it seems like this was already done at 1700 today.  He was noted to have possible AKI on initial evaluation in the [facility] long ED. -Pt arrived on floor at approximately 2030.  No interval change in neurological symptoms compared to H&P.  Endorses no dysuria, abdominal pain, N/V/D, chest pain, dyspnea. - Further discussed his home medication regimen and he is not due for Cabenuva injection until September according to him.  The last injection I see in the chart is on 7/20 of this year, that note mentions next injection scheduled for 9/13.  HIV quant at that time was undetectable.    Objective:  Vital signs in last 24 hours: Vitals:   11/12/21 1838 11/12/21 1850 11/12/21 1945 11/12/21 2036  BP:   128/67 136/71  Pulse: (!) 55  (!) 40 (!) 54  Resp: (!) '24  19 16  '$ Temp:  98.3 F (36.8 C)  98.2 F (36.8 C)  TempSrc:  Oral  Oral  SpO2: 97%  95% 99%   Supplemental O2: Room Air SpO2: 99 %   Physical Exam:  Constitutional:  well-appearing male sitting in bed, in no acute distress Cardiovascular: RRR, no murmurs, rubs or gallops Pulmonary/Chest: normal work of breathing on room air, lungs clear to auscultation bilaterally Abdominal: soft, non-tender, non-distended Skin: warm and dry Extremities: upper/lower extremity pulses 2+, no lower extremity edema present Neuro: A&O x3.  Cranial nerves grossly intact.  Extremities 5 out of 5 strength.  No sensory deficits reported.  There were no vitals filed for this visit.   Intake/Output Summary (Last 24 hours) at 11/12/2021 2222 Last data filed at 11/12/2021 1959 Gross per 24 hour  Intake 1000 ml  Output --  Net 1000 ml   Net IO Since Admission: 1,000 mL [11/12/21 2222]  Pertinent Labs:    Latest Ref Rng & Units 11/12/2021    3:14 PM 10/24/2021    9:52 AM 10/24/2021    9:43 AM  CBC  WBC 4.0 - 10.5 K/uL 8.9   6.7   Hemoglobin 13.0 - 17.0 g/dL 14.8  14.3  14.0   Hematocrit 39.0 - 52.0 % 45.3  42.0  40.4   Platelets 150 - 400 K/uL 160   313        Latest Ref Rng & Units 11/12/2021    3:14 PM 10/24/2021   10:56 AM 10/24/2021    9:52 AM  CMP  Glucose 70 - 99 mg/dL 125  102  108   BUN 8 - 23 mg/dL '22  16  24   '$ Creatinine 0.61 - 1.24 mg/dL 1.80  1.49  1.40   Sodium 135 - 145 mmol/L 140  140  140   Potassium 3.5 - 5.1 mmol/L 3.7  3.9  5.4   Chloride 98 - 111 mmol/L 107  108  105   CO2 22 - 32 mmol/L 26  25    Calcium 8.9 - 10.3 mg/dL 8.8  8.8    Total Protein 6.5 - 8.1 g/dL 8.8  7.8    Total Bilirubin 0.3 - 1.2 mg/dL 0.5  0.3    Alkaline Phos 38 - 126 U/L 72  67    AST 15 - 41 U/L 17  19    ALT 0 - 44 U/L 13  13    PT 14, INR 1.1  Imaging: CT HEAD WO CONTRAST (5MM)  Result Date: 11/12/2021 CLINICAL DATA:  Fall 2 weeks ago with subdural hematoma seen on outpatient head CT. EXAM: CT HEAD WITHOUT CONTRAST TECHNIQUE: Contiguous axial images were obtained from the base of the skull through the vertex without intravenous contrast. RADIATION DOSE REDUCTION:  This exam was performed according to the departmental dose-optimization program which includes automated exposure control, adjustment of the mA and/or kV according to patient size and/or use of iterative reconstruction technique. COMPARISON:  Same-day noncontrast head CT FINDINGS: Brain: The mixed density right cerebral convexity subdural hematoma measuring up to 9 mm in maximal thickness is unchanged compared to the head CT obtained earlier the same day. There is no mass effect on the underlying brain parenchyma or midline shift. There is no new acute intracranial hemorrhage or extra-axial fluid collection. There is no evidence of acute territorial infarct. The parenchymal volume is normal. The ventricles are normal in size. There is no mass lesion. There is no midline shift. Vascular: No hyperdense vessel or unexpected calcification. Skull: Normal. Negative for fracture or focal lesion. Sinuses/Orbits: The imaged paranasal sinuses are clear. The globes and orbits are unremarkable. Other: None. IMPRESSION: 1. Stable mixed density right cerebral convexity subdural hematoma measuring up to 9 mm in maximal thickness with no midline shift. 2. No new acute intracranial pathology. Electronically Signed   By: Valetta Mole M.D.   On: 11/12/2021 18:13   CT HEAD WO CONTRAST (5MM)  Result Date: 11/12/2021 CLINICAL DATA:  Subdural hematoma EXAM: CT HEAD WITHOUT CONTRAST TECHNIQUE: Contiguous axial images were obtained from the base of the skull through the vertex without intravenous contrast. RADIATION DOSE REDUCTION: This exam was performed according to the departmental dose-optimization program which includes automated exposure control, adjustment of the mA and/or kV according to patient size and/or use of iterative reconstruction technique. COMPARISON:  CT head 10/24/2021 FINDINGS: Brain: There is a mixed density right cerebral convexity subdural hematoma measuring up to 9 mm in maximal thickness in the coronal plane,  not seen on the study from 10/24/2021. There are areas of relative hyperdensity layering dependently within the collection suggesting acute to subacute blood products. There is no significant mass effect on the underlying brain parenchyma and no midline shift. The previously seen left cerebral convexity subdural hematoma has resolved. There is no evidence of acute infarct. Parenchymal volume is stable. The ventricles are stable in size. Patchy hypodensity in the supratentorial white matter likely reflecting sequela of mild chronic white matter microangiopathy is unchanged There is no mass lesion. Vascular: No hyperdense vessel or unexpected calcification.  Skull: Normal. Negative for fracture or focal lesion. Sinuses/Orbits: The imaged paranasal sinuses are clear. The globes and orbits are unremarkable. Other: None. IMPRESSION: 1. 9 mm mixed density right cerebral convexity subdural hematoma is new since 10/24/2021 with hyperdensity likely reflecting acute to subacute blood products. There is no significant mass effect on the underlying brain parenchyma or midline shift. 2. The previously seen small left cerebral convexity subdural hematoma has resolved. These results were called by telephone at the time of interpretation on 11/12/2021 at 11:51 am to provider [facility] , who verbally acknowledged these results. Electronically Signed   By: Valetta Mole M.D.   On: 11/12/2021 11:52     EKG: My EKG interpretation is as follows: Sinus bradycardia.  Similar to multiple previously obtained EKGs going as far back as 2017.  Assessment/Plan:  Patient Summary: Allen Espinoza is a 61 y.o. with a pertinent PMH of CKD 3, HTN, HFpEF, asthma, HIV, obesity, hidradenitis suppurativa, OSA, DVT in 2010 previously on Xarelto, recent fall with left SDH, who was directly admitted for new acute to subacute right SDH.   #Acute to subacute right SDH #Fall 7/26 with resolved left SDH #DVT in 2010 previously on Xarelto Directly  admitted from [facility] long ED.  New right-sided acute to subacute frontotemporal SDH visualized on neurology follow-up.  Patient reports no interval falls or new focal neurological deficits and his Xarelto was held on discharge following his initial left-sided SDH.  Patient received his second CT head ahead of scheduled time, but SDH appears to be stable without any significant midline shift.  Neurosurgery consulted and believes there is no indication for surgery at this time.  We will continue to hold Xarelto and monitor.  Patient can likely be discharged in the next day or 2 with outpatient follow-up unless he develops new neurological symptoms. - Hold Xarelto - Neurosurgery consulted, appreciate recs - Consider outpatient neurology follow-up for chronic balance issues/PT consult.  #AKI on CKD 3 Creatinine noted to be 1.8 at [facility] long ED, meeting AKI criteria from baseline of 1.4-1.5.  Will hold home Lasix and olmesartan.  He is status post 1 L fluid bolus at [facility] long ED and appears euvolemic on exam.   -Trend BMP.  Consider nephrology consult or follow-up if advancement of CKD suspected. - Hold home Lasix and olmesartan  #HTN Holding home Lasix and olmesartan as above.  He has been getting Lasix 20 mg daily for symptomatic management of BLE edema.  His most recent echo in December 2020 shows EF 60 to 65% without any LV dysfunction.  He is euvolemic on exam.  Patient was started on Norvasc 5 daily with hydralazine 10 mg as needed at [place] long.  He has been normotensive without getting either the Norvasc or hydralazine.  If creatinine resolves tomorrow, can likely restart his home olmesartan and DC Norvasc.  He is primarily  #HIV with neuropathy He is due for his next [place] administration on 9/13.  Last HIV quant was undetectable.  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 - Does not need Cabenuva during this hospital stay - Continue gabapentin as  above  #Hidradenitis suppurativa -due for Humira Tomorrow, continue metronidazole '250mg'$  TID  #Asthma -albuterol nebs PRN  #Sleep disorder #OSA -continue klonopin '1mg'$  QHS -Continue CPAP   Diet: Heart Healthy IVF: None, VTE: SCDs Code: Full PT/OT recs:    TOC recs:  Family Update:   Dispo: Anticipated discharge to Home in 1 day.  Linus Galas, MD PGY-1 Internal Medicine Resident Please contact the on call pager after 5 pm and on weekends at [phone].

## 2021-11-12 NOTE — Hospital Course (Signed)
No ha, vision changes, no FND, some chest pain over broekn ribs. No interval falls, that was the first fall he had in years. No fall workup/  Increased urinary freqnecy on lasix.no dysuria, abd pain. No n/v/d. No fevers.   -Cabenuva every 2 month (next one due in September -taking klonopin 0.'5mg'$  once at night -gabapentin 100 mg TID -humira 80 mg weekly on Tuesdays -metronidazole '250mg'$  TID -olmesartan '20mg'$  daily -lasix '20mg'$  daily  SH Live [place] with fiance Manages ADLs iADLs by himself. No driving. Ambulatory, DOE walking up flight of stairs. No smoking/alcohol in 20-30 yrs.   Full code.

## 2021-11-12 NOTE — Telephone Encounter (Signed)
Contacted pt again, spoke to him and informed him of Janett Billow and Dr Dohmeier's recommendations of proceeding to ED. He verbally understood and was appreciative for the call.
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
## 2021-11-12 NOTE — Progress Notes (Signed)
I have reviewed both CTH from today, not clear why he got the second. Very small L frontal SDH without mass effect or shift. No indication for surgery at this time. Hold all blood thinners

## 2021-11-12 NOTE — Telephone Encounter (Signed)
Pt called back stating that the provider had called and LVM for him to call back. Pt was informed that he is to go to the nearest ED per provider. Pt verbalized understanding and stated that he will be going to the ED.

## 2021-11-12 NOTE — Telephone Encounter (Signed)
This is the not the same location of SDH-  a new bleed. Patient  soul be referred to ED,

## 2021-11-13 DIAGNOSIS — B20 Human immunodeficiency virus [HIV] disease: Secondary | ICD-10-CM

## 2021-11-13 DIAGNOSIS — N179 Acute kidney failure, unspecified: Secondary | ICD-10-CM

## 2021-11-13 DIAGNOSIS — Z7901 Long term (current) use of anticoagulants: Secondary | ICD-10-CM | POA: Diagnosis not present

## 2021-11-13 DIAGNOSIS — G629 Polyneuropathy, unspecified: Secondary | ICD-10-CM

## 2021-11-13 DIAGNOSIS — S065X0A Traumatic subdural hemorrhage without loss of consciousness, initial encounter: Principal | ICD-10-CM

## 2021-11-13 DIAGNOSIS — N1831 Chronic kidney disease, stage 3a: Secondary | ICD-10-CM | POA: Diagnosis not present

## 2021-11-13 DIAGNOSIS — I82409 Acute embolism and thrombosis of unspecified deep veins of unspecified lower extremity: Secondary | ICD-10-CM

## 2021-11-13 DIAGNOSIS — L732 Hidradenitis suppurativa: Secondary | ICD-10-CM | POA: Diagnosis not present

## 2021-11-13 DIAGNOSIS — Z6841 Body Mass Index (BMI) 40.0 and over, adult: Secondary | ICD-10-CM

## 2021-11-13 LAB — COMPREHENSIVE METABOLIC PANEL
ALT: 12 U/L (ref 0–44)
AST: 12 U/L — ABNORMAL LOW (ref 15–41)
Albumin: 2.7 g/dL — ABNORMAL LOW (ref 3.5–5.0)
Alkaline Phosphatase: 55 U/L (ref 38–126)
Anion gap: 5 (ref 5–15)
BUN: 17 mg/dL (ref 8–23)
CO2: 26 mmol/L (ref 22–32)
Calcium: 8.6 mg/dL — ABNORMAL LOW (ref 8.9–10.3)
Chloride: 109 mmol/L (ref 98–111)
Creatinine, Ser: 1.45 mg/dL — ABNORMAL HIGH (ref 0.61–1.24)
GFR, Estimated: 54 mL/min — ABNORMAL LOW (ref >60)
Glucose, Bld: 102 mg/dL — ABNORMAL HIGH (ref 70–99)
Potassium: 3.9 mmol/L (ref 3.5–5.1)
Sodium: 140 mmol/L (ref 135–145)
Total Bilirubin: 0.7 mg/dL (ref 0.3–1.2)
Total Protein: 6.4 g/dL — ABNORMAL LOW (ref 6.5–8.1)

## 2021-11-13 LAB — CBC
HCT: 39.1 % (ref 39.0–52.0)
Hemoglobin: 12.9 g/dL — ABNORMAL LOW (ref 13.0–17.0)
MCH: 33.8 pg (ref 26.0–34.0)
MCHC: 33 g/dL (ref 30.0–36.0)
MCV: 102.4 fL — ABNORMAL HIGH (ref 80.0–100.0)
Platelets: 129 10*3/uL — ABNORMAL LOW (ref 150–400)
RBC: 3.82 MIL/uL — ABNORMAL LOW (ref 4.22–5.81)
RDW: 12.4 % (ref 11.5–15.5)
WBC: 6.8 10*3/uL (ref 4.0–10.5)
nRBC: 0 % (ref 0.0–0.2)

## 2021-11-13 LAB — PROTIME-INR
INR: 1.1 (ref 0.8–1.2)
Prothrombin Time: 14.5 seconds (ref 11.4–15.2)

## 2021-11-13 MED ORDER — ORAL CARE MOUTH RINSE: 15.0000 mL | OROMUCOSAL | Status: DC | PRN

## 2021-11-13 MED ORDER — ALBUTEROL SULFATE (2.5 MG/3ML) 0.083% IN NEBU
2.5000 mg | INHALATION_SOLUTION | Freq: Four times a day (QID) | RESPIRATORY_TRACT | Status: DC | PRN
Start: 2021-11-13 — End: 2021-11-13

## 2021-11-13 MED ORDER — ADALIMUMAB 80 MG/0.8ML ~~LOC~~ AJKT
80.0000 mg | AUTO-INJECTOR | SUBCUTANEOUS | Status: DC
Start: 2021-11-13 — End: 2021-11-13

## 2021-11-13 NOTE — Evaluation (Signed)
Physical Therapy Evaluation Patient Details Name: Allen Espinoza MRN: [id number] DOB: [birth year] Today's Date: 11/13/2021  History of Present Illness  61 yo male presents to ED from neurologist office after head CT revealed 9 mm mixed density right cerebral convexity subdural hematoma new since 10/24/2021 hospitalization after fall down 18 steps with L sided subdural hematoma. PMH: CKD stage IIIa, creatinine baseline 1.5, history of DVT 2010 was on Xarelto, asthma, HIV, morbid obesity, sleep apnea, L THA  Clinical Impression  PTA pt living with fiance in second story apartment with 18 steps to enter. Pt reports independence in ambulation and stair navigation as well as ADLs and iADLs. Pt able to display independence with PT. Pt scores 23/24 on DGI, indicative of decreased risk of fall. Pt reports he more active prior to fall in July and L THA and has been performing progressively less daily out of apartment activity. Encouraged pt to resume THA exercises and resuming activities like taking the trash out and getting the mail daily to progress mobility. Pt has no additional PT or equipment needs at this time. PT signing off.       Recommendations for follow up therapy are one component of a multi-disciplinary discharge planning process, led by the attending physician.  Recommendations may be updated based on patient status, additional functional criteria and insurance authorization.  Follow Up Recommendations No PT follow up      Assistance Recommended at Discharge None     Equipment Recommendations None recommended by PT     Functional Status Assessment Patient has not had a recent decline in their functional status     Precautions / Restrictions Precautions Precautions: Fall Restrictions Weight Bearing Restrictions: No      Mobility  Bed Mobility               General bed mobility comments: OOB in recliner    Transfers Overall transfer level: Independent                       Ambulation/Gait Ambulation/Gait assistance: Independent Gait Distance (Feet): 600 Feet Assistive device: None Gait Pattern/deviations: Step-through pattern, Wide base of support, WFL(Within Functional Limits) Gait velocity: WFl Gait velocity interpretation: >2.62 ft/sec, indicative of community ambulatory   General Gait Details: pt with strong, overall steady ambulation, self selects wide BoS secondary to knock-knees and improved balance wide BoS provides  Stairs Stairs: Yes Stairs assistance: Modified independent (Device/Increase time) Stair Management: Two rails, Alternating pattern, Forwards Number of Stairs: 12 General stair comments: utilizes bilateral rails for steady step over step ascend/descend of stairs        Balance Overall balance assessment: Independent                               Standardized Balance Assessment Standardized Balance Assessment : Dynamic Gait Index   Dynamic Gait Index Level Surface: Normal Change in Gait Speed: Normal Gait with Horizontal Head Turns: Normal Gait with Vertical Head Turns: Normal Gait and Pivot Turn: Normal Step Over Obstacle: Normal Step Around Obstacles: Normal Steps: Mild Impairment Total Score: 23       Pertinent Vitals/Pain Pain Assessment Pain Assessment: Faces Faces Pain Scale: Hurts a little bit Pain Location: L hip with ambulation Pain Descriptors / Indicators: Aching, Sore Pain Intervention(s): Limited activity within patient's tolerance, Repositioned    Home Living Family/patient expects to be discharged to:: Private residence Living Arrangements: Spouse/significant other Available Help  at Discharge: Family;Available 24 hours/day Type of Home: Apartment Home Access: Stairs to enter Entrance Stairs-Rails: Right;Left;Can reach both Entrance Stairs-Number of Steps: 18   Home Layout: One level Home Equipment: Conservation officer, nature (2 wheels);Cane - single point;Crutches;BSC/3in1       Prior Function Prior Level of Function : Independent/Modified Independent                        Extremity/Trunk Assessment   Upper Extremity Assessment Upper Extremity Assessment: Overall WFL for tasks assessed    Lower Extremity Assessment Lower Extremity Assessment: RLE deficits/detail;LLE deficits/detail RLE Deficits / Details: ROM WFL, strength grossly 5/5 LLE Deficits / Details: ROM WFL, hip 4/5, knee 5/5, ankle 5/5, weakness s/p L hip replacement       Communication   Communication: No difficulties  Cognition Arousal/Alertness: Awake/alert Behavior During Therapy: WFL for tasks assessed/performed Overall Cognitive Status: Within Functional Limits for tasks assessed                                          General Comments General comments (skin integrity, edema, etc.): VSS on RA    Exercises     Assessment/Plan    PT Assessment Patient does not need any further PT services         PT Goals (Current goals can be found in the Care Plan section)  Acute Rehab PT Goals Patient Stated Goal: get back to regular exercise PT Goal Formulation: All assessment and education complete, DC therapy Potential to Achieve Goals: Good     AM-PAC PT "6 Clicks" Mobility  Outcome Measure Help needed turning from your back to your side while in a flat bed without using bedrails?: None Help needed moving from lying on your back to sitting on the side of a flat bed without using bedrails?: None Help needed moving to and from a bed to a chair (including a wheelchair)?: None Help needed standing up from a chair using your arms (e.g., wheelchair or bedside chair)?: None Help needed to walk in hospital room?: None Help needed climbing 3-5 steps with a railing? : None 6 Click Score: 24    End of Session   Activity Tolerance: Patient tolerated treatment well Patient left: in chair;with call bell/phone within reach;with chair alarm set Nurse Communication:  Mobility status PT Visit Diagnosis: Muscle weakness (generalized) (M62.81)    Time: 6712-4580 PT Time Calculation (min) (ACUTE ONLY): 27 min   Charges:   PT Evaluation $PT Eval Low Complexity: 1 Low PT Treatments $Therapeutic Exercise: 8-22 mins        Deserea Bordley B. Migdalia Dk PT, DPT Acute Rehabilitation Services Please use secure chat or  Call Office [phone]   [place] 11/13/2021, 10:35 AM

## 2021-11-13 NOTE — Progress Notes (Signed)
Discharge paperwork has been printed and reviewed with patient.  Patient verbalizes understanding and has no questions.  Peripheral iv has been removed.

## 2021-11-13 NOTE — Plan of Care (Signed)

## 2021-11-13 NOTE — Discharge Summary (Addendum)
Name: Allen Espinoza MRN: [id number] DOB: Jul 17, 1960 61 y.o. PCP: [name], MD  Date of Admission: 11/12/2021  2:57 PM Date of Discharge: 11/13/2021 Attending Physician: Dr. Angelia Mould  Discharge Diagnosis: Principal Problem:   Subdural hematoma ([place]) Active Problems:   HIV (human immunodeficiency virus infection) ([place])   DVT (deep venous thrombosis) (HCC)   Chronic kidney disease, stage 3a (HCC)   Morbid obesity ([place])   Asthma   GERD (gastroesophageal reflux disease)    Discharge Medications: Allergies as of 11/13/2021       Reactions   Sulfa Antibiotics Other (See Comments)   Low blood pressure   Vancomycin Itching   Give with benadryl        Medication List     STOP taking these medications    rivaroxaban 20 MG Tabs tablet Commonly known as: XARELTO       TAKE these medications    Cabenuva 600 & 900 MG/3ML injection Generic drug: cabotegravir & rilpivirine ER Inject 1 kit into the muscle every 8 (eight) weeks.   clonazePAM 0.5 MG tablet Commonly known as: KLONOPIN Take 2 tablets (1 mg total) by mouth at bedtime. Follow titration instructions provided separately in writing. What changed:  how much to take when to take this additional instructions   furosemide 40 MG tablet Commonly known as: LASIX Take 40 mg by mouth daily. What changed: Another medication with the same name was removed. Continue taking this medication, and follow the directions you see here.   gabapentin 100 MG capsule Commonly known as: NEURONTIN Take 1 capsule (100 mg total) by mouth 3 (three) times daily.   Humira Pen 80 MG/0.8ML Pnkt Generic drug: Adalimumab Inject 80 mg into the skin once a week. Tuesday's   metroNIDAZOLE 250 MG tablet Commonly known as: FLAGYL Take 1 tablet (250 mg total) by mouth 3 (three) times daily.   olmesartan 20 MG tablet Commonly known as: BENICAR Take 1 tablet by mouth once daily        Disposition and follow-up:   [name]  Siever was discharged from [facility] in Good condition.  At the hospital follow up visit please address:  1.  Follow-up:  a. Subdural Hematoma:  -needs follow-up appointment with neurosurgery in 2 weeks -should be off Xarelto still    b. AKI on CKD3a: repeat BMP  2.  Labs / imaging needed at time of follow-up: BMP  3.  Pending labs/ test needing follow-up: none  4.  Medication Changes  -Hold Xarelto until neurosurgery follow-up appointment  Follow-up Appointments: Patient reports PCP appointment for tomorrow afternoon with Dr. Mancel Bale. He will need to schedule follow-up appointment with neurosurgery in 2 weeks.   Hospital Course by problem list: Acute to subacute right SDH Fall 7/26 with resolved left SDH DVT in 2013, second provoked DVT 2018 (hip sx) previously on Xarelto Patient admitted for CT head that showed new right-sided acute to subacute SDH. History of fall on 7/26 and CT head then showed left SDH. Patient reports no interval falls or new focal neurological deficits and his Xarelto was held on discharge following his initial left-sided SDH. Repeat CT head when he arrived to [facility] showed stable SDH without significant midline shift. Neurosurgery consulted and believes no indication for surgery at this time. Will need outpatient follow up with their office in 2 weeks. Patient will continue to hold off Xarelto until then. He has been on Xarelto for several years. Initial DVT in 2013. Noted  to have a popliteal DVT in 2018 after hip surgery and has been on Xarelto indefinitely since then. Will need follow-up to determine if he will continue Xarelto or not given his recent SDH. On day of discharge, patient reports feeling well with no new concerns. Denies any symptoms. No focal neurological deficits. PT evaluated him and believes he does not need additional PT.    AKI on CKD 3 Creatinine 1.8 at [facility] ED, baseline around 1.4-1.5. He received IV fluid and  creatinine improved back to baseline.    HIV with neuropathy He is due for his next [place] administration on 9/13.  Last HIV quant was undetectable.  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   Hidradenitis suppurativa Patient reports Humira due on 8/15 which he self injects. Will discharge him today so he can resume that at home. He is also on metronidazole 235m three times daily.    Discharge Subjective: Patient reports feeling well and no new concerns this morning. Denies headache, confusion, dizziness or vision changes. Patient has appointment scheduled already with PCP for tomorrow afternoon.   Discharge Exam:   BP 131/70 (BP Location: Left Arm)   Pulse (!) 48   Temp 98 F (36.7 C) (Oral)   Resp 16   Ht 6' (1.829 m)   Wt (!) 145.4 kg   SpO2 100%   BMI 43.47 kg/m  Constitutional: well-appearing, sitting in chair, in no acute distress HENT: normocephalic atraumatic Eyes: EOM intact Neck: supple Cardiovascular: bradycardia, normal rhythm Pulmonary: normal work of breathing on room air MSK: normal bulk and tone Neurological: alert & oriented x 3  CN2-12 intact  Finger-to-nose intact  Sensation UE intact bilaterally  5/5 strength UE bilaterally  Pronator drift negative  Sensation LE intact bilaterally  5/5 strength LE intact bilaterally Skin: warm and dry Psych: pleasant, normal mood and behavior    Pertinent Labs, Studies, and Procedures:     Latest Ref Rng & Units 11/13/2021    4:12 AM 11/12/2021    3:14 PM 10/24/2021    9:52 AM  CBC  WBC 4.0 - 10.5 K/uL 6.8  8.9    Hemoglobin 13.0 - 17.0 g/dL 12.9  14.8  14.3   Hematocrit 39.0 - 52.0 % 39.1  45.3  42.0   Platelets 150 - 400 K/uL 129  160         Latest Ref Rng & Units 11/13/2021    4:12 AM 11/12/2021    3:14 PM 10/24/2021   10:56 AM  CMP  Glucose 70 - 99 mg/dL 102  125  102   BUN 8 - 23 mg/dL _0 Creatinine 0.61 - 1.24 mg/dL 1.45  1.80  1.49   Sodium  135 - 145 mmol/L 140  140  140   Potassium 3.5 - 5.1 mmol/L 3.9  3.7  3.9   Chloride 98 - 111 mmol/L 109  107  108   CO2 22 - 32 mmol/L _1 Calcium 8.9 - 10.3 mg/dL 8.6  8.8  8.8   Total Protein 6.5 - 8.1 g/dL 6.4  8.8  7.8   Total Bilirubin 0.3 - 1.2 mg/dL 0.7  0.5  0.3   Alkaline Phos 38 - 126 U/L 55  72  67   AST 15 - 41 U/L _2 ALT 0 - 44 U/L 12  13  13  CT HEAD WO CONTRAST (5MM)  Result Date: 11/12/2021 CLINICAL DATA:  Fall 2 weeks ago with subdural hematoma seen on outpatient head CT. EXAM: CT HEAD WITHOUT CONTRAST TECHNIQUE: Contiguous axial images were obtained from the base of the skull through the vertex without intravenous contrast. RADIATION DOSE REDUCTION: This exam was performed according to the departmental dose-optimization program which includes automated exposure control, adjustment of the mA and/or kV according to patient size and/or use of iterative reconstruction technique. COMPARISON:  Same-day noncontrast head CT FINDINGS: Brain: The mixed density right cerebral convexity subdural hematoma measuring up to 9 mm in maximal thickness is unchanged compared to the head CT obtained earlier the same day. There is no mass effect on the underlying brain parenchyma or midline shift. There is no new acute intracranial hemorrhage or extra-axial fluid collection. There is no evidence of acute territorial infarct. The parenchymal volume is normal. The ventricles are normal in size. There is no mass lesion. There is no midline shift. Vascular: No hyperdense vessel or unexpected calcification. Skull: Normal. Negative for fracture or focal lesion. Sinuses/Orbits: The imaged paranasal sinuses are clear. The globes and orbits are unremarkable. Other: None. IMPRESSION: 1. Stable mixed density right cerebral convexity subdural hematoma measuring up to 9 mm in maximal thickness with no midline shift. 2. No new acute intracranial pathology. Electronically Signed   By: Valetta Mole  M.D.   On: 11/12/2021 18:13   CT HEAD WO CONTRAST (5MM)  Result Date: 11/12/2021 CLINICAL DATA:  Subdural hematoma EXAM: CT HEAD WITHOUT CONTRAST TECHNIQUE: Contiguous axial images were obtained from the base of the skull through the vertex without intravenous contrast. RADIATION DOSE REDUCTION: This exam was performed according to the departmental dose-optimization program which includes automated exposure control, adjustment of the mA and/or kV according to patient size and/or use of iterative reconstruction technique. COMPARISON:  CT head 10/24/2021 FINDINGS: Brain: There is a mixed density right cerebral convexity subdural hematoma measuring up to 9 mm in maximal thickness in the coronal plane, not seen on the study from 10/24/2021. There are areas of relative hyperdensity layering dependently within the collection suggesting acute to subacute blood products. There is no significant mass effect on the underlying brain parenchyma and no midline shift. The previously seen left cerebral convexity subdural hematoma has resolved. There is no evidence of acute infarct. Parenchymal volume is stable. The ventricles are stable in size. Patchy hypodensity in the supratentorial white matter likely reflecting sequela of mild chronic white matter microangiopathy is unchanged There is no mass lesion. Vascular: No hyperdense vessel or unexpected calcification. Skull: Normal. Negative for fracture or focal lesion. Sinuses/Orbits: The imaged paranasal sinuses are clear. The globes and orbits are unremarkable. Other: None. IMPRESSION: 1. 9 mm mixed density right cerebral convexity subdural hematoma is new since 10/24/2021 with hyperdensity likely reflecting acute to subacute blood products. There is no significant mass effect on the underlying brain parenchyma or midline shift. 2. The previously seen small left cerebral convexity subdural hematoma has resolved. These results were called by telephone at the time of  interpretation on 11/12/2021 at 11:51 am to provider [facility] , who verbally acknowledged these results. Electronically Signed   By: Valetta Mole M.D.   On: 11/12/2021 11:52     Discharge Instructions: Discharge Instructions     Call MD for:  difficulty breathing, headache or visual disturbances   Complete by: As directed    Call MD for:  extreme fatigue   Complete by: As directed  Call MD for:  hives   Complete by: As directed    Call MD for:  persistant dizziness or light-headedness   Complete by: As directed    Call MD for:  persistant nausea and vomiting   Complete by: As directed    Call MD for:  severe uncontrolled pain   Complete by: As directed    Call MD for:  temperature >100.4   Complete by: As directed    Diet - low sodium heart healthy   Complete by: As directed    Increase activity slowly   Complete by: As directed        Signed: Angelique Blonder, DO 11/13/2021, 2:54 PM   Pager: [phone]

## 2021-11-13 NOTE — Progress Notes (Signed)
Pt arrived from [place] in stable condition, VS WNL, oriented pt to room and call light.

## 2021-11-13 NOTE — Progress Notes (Signed)
Patient ID: Allen Espinoza, male   DOB: 1961-01-12, 61 y.o.   MRN: [id number] Subjective: Patient reports mild headache.  No visual changes.  No numbness tingling or weakness  Objective: Vital signs in last 24 hours: Temp:  [97.7 F (36.5 C)-98.3 F (36.8 C)] 97.7 F (36.5 C) (08/15 0746) Pulse Rate:  [40-79] 49 (08/15 0746) Resp:  [12-28] 20 (08/15 0746) BP: (90-149)/(58-79) 131/64 (08/15 0746) SpO2:  [93 %-100 %] 99 % (08/15 0746) Weight:  [145.4 kg] 145.4 kg (08/14 2036)  Intake/Output from previous day: 08/14 0701 - 08/15 0700 In: 1250 [P.O.:250; IV Piggyback:1000] Out: 350 [Urine:350] Intake/Output this shift: Total I/O In: -  Out: 250 [Urine:250]  He is awake and alert and pleasant and interactive.  No focal motor or sensory deficit.  Lab Results: Lab Results  Component Value Date   WBC 6.8 11/13/2021   HGB 12.9 (L) 11/13/2021   HCT 39.1 11/13/2021   MCV 102.4 (H) 11/13/2021   PLT 129 (L) 11/13/2021   Lab Results  Component Value Date   INR 1.1 11/13/2021   BMET Lab Results  Component Value Date   NA 140 11/13/2021   K 3.9 11/13/2021   CL 109 11/13/2021   CO2 26 11/13/2021   GLUCOSE 102 (H) 11/13/2021   BUN 17 11/13/2021   CREATININE 1.45 (H) 11/13/2021   CALCIUM 8.6 (L) 11/13/2021    Studies/Results: CT HEAD WO CONTRAST (5MM)  Result Date: 11/12/2021 CLINICAL DATA:  Fall 2 weeks ago with subdural hematoma seen on outpatient head CT. EXAM: CT HEAD WITHOUT CONTRAST TECHNIQUE: Contiguous axial images were obtained from the base of the skull through the vertex without intravenous contrast. RADIATION DOSE REDUCTION: This exam was performed according to the departmental dose-optimization program which includes automated exposure control, adjustment of the mA and/or kV according to patient size and/or use of iterative reconstruction technique. COMPARISON:  Same-day noncontrast head CT FINDINGS: Brain: The mixed density right cerebral convexity subdural hematoma  measuring up to 9 mm in maximal thickness is unchanged compared to the head CT obtained earlier the same day. There is no mass effect on the underlying brain parenchyma or midline shift. There is no new acute intracranial hemorrhage or extra-axial fluid collection. There is no evidence of acute territorial infarct. The parenchymal volume is normal. The ventricles are normal in size. There is no mass lesion. There is no midline shift. Vascular: No hyperdense vessel or unexpected calcification. Skull: Normal. Negative for fracture or focal lesion. Sinuses/Orbits: The imaged paranasal sinuses are clear. The globes and orbits are unremarkable. Other: None. IMPRESSION: 1. Stable mixed density right cerebral convexity subdural hematoma measuring up to 9 mm in maximal thickness with no midline shift. 2. No new acute intracranial pathology. Electronically Signed   By: Valetta Mole M.D.   On: 11/12/2021 18:13   CT HEAD WO CONTRAST (5MM)  Result Date: 11/12/2021 CLINICAL DATA:  Subdural hematoma EXAM: CT HEAD WITHOUT CONTRAST TECHNIQUE: Contiguous axial images were obtained from the base of the skull through the vertex without intravenous contrast. RADIATION DOSE REDUCTION: This exam was performed according to the departmental dose-optimization program which includes automated exposure control, adjustment of the mA and/or kV according to patient size and/or use of iterative reconstruction technique. COMPARISON:  CT head 10/24/2021 FINDINGS: Brain: There is a mixed density right cerebral convexity subdural hematoma measuring up to 9 mm in maximal thickness in the coronal plane, not seen on the study from 10/24/2021. There are areas of relative hyperdensity layering  dependently within the collection suggesting acute to subacute blood products. There is no significant mass effect on the underlying brain parenchyma and no midline shift. The previously seen left cerebral convexity subdural hematoma has resolved. There is no  evidence of acute infarct. Parenchymal volume is stable. The ventricles are stable in size. Patchy hypodensity in the supratentorial white matter likely reflecting sequela of mild chronic white matter microangiopathy is unchanged There is no mass lesion. Vascular: No hyperdense vessel or unexpected calcification. Skull: Normal. Negative for fracture or focal lesion. Sinuses/Orbits: The imaged paranasal sinuses are clear. The globes and orbits are unremarkable. Other: None. IMPRESSION: 1. 9 mm mixed density right cerebral convexity subdural hematoma is new since 10/24/2021 with hyperdensity likely reflecting acute to subacute blood products. There is no significant mass effect on the underlying brain parenchyma or midline shift. 2. The previously seen small left cerebral convexity subdural hematoma has resolved. These results were called by telephone at the time of interpretation on 11/12/2021 at 11:51 am to provider [facility] , who verbally acknowledged these results. Electronically Signed   By: Valetta Mole M.D.   On: 11/12/2021 11:52    Assessment/Plan: He looks great clinically.  I suspect this is a simple delayed hematoma from the original fall that did not show up on the first 2 images not a spontaneous subdural hematoma.  He is safe for discharge home to follow-up with me in 2 weeks with a repeat head CT without contrast.  Remain off of Xarelto for now.  The real question is whether he should resume Xarelto at any time.  He is on it for a DVT he had in 2013.  Not sure why continues to take it 10 years after the DVT unless he has a hypercoagulable state.  He is unaware of any hypercoagulable diagnosis.  Estimated body mass index is 43.47 kg/m as calculated from the following:   Height as of this encounter: 6' (1.829 m).   Weight as of this encounter: 145.4 kg.    LOS: 0 days    Eustace Moore 11/13/2021, 8:21 AM

## 2021-11-13 NOTE — Progress Notes (Signed)
Patient refused cpap hs. No distress noted at this time.

## 2021-11-13 NOTE — Discharge Instructions (Addendum)
You were hospitalized for a subdural hematoma or brain bleed noted on CT scan. Repeat CT scan showed it was stable and neurosurgery evaluated you. They believe the bleed is from the fall you had in July that was not visible on previous CT scans. Dr. Ronnald Ramp from neurosurgery recommended follow-up at his office in 2 weeks with repeat CT scan. You did not have any neurological changes and were stable for discharge. You will continue holding your Xarelto until your follow-up appointment. Thank you for allowing [place] to be part of your care.   Please note these changes made to your medications:  Please STOP taking:  -Xarelto  Please make sure to go to your scheduled appointment with Dr. Mancel Bale tomorrow. Also, please make sure to schedule 2 week follow-up with Dr. Sherley Bounds from neurosurgery.   Please call our clinic if you have any questions or concerns, we may be able to help and keep you from a long and expensive emergency room wait. Our clinic and after hours phone number is [phone], the best time to call is Monday through Friday 9 am to 4 pm but there is always someone available 24/7 if you have an emergency.

## 2021-11-14 DIAGNOSIS — J209 Acute bronchitis, unspecified: Secondary | ICD-10-CM | POA: Diagnosis not present

## 2021-11-19 ENCOUNTER — Ambulatory Visit: Payer: Medicaid Other | Admitting: Orthopaedic Surgery

## 2021-11-19 ENCOUNTER — Ambulatory Visit (INDEPENDENT_AMBULATORY_CARE_PROVIDER_SITE_OTHER): Payer: Medicaid Other

## 2021-11-19 VITALS — Wt 336.0 lb

## 2021-11-19 DIAGNOSIS — M79605 Pain in left leg: Secondary | ICD-10-CM | POA: Diagnosis not present

## 2021-11-19 MED ORDER — GABAPENTIN 300 MG PO CAPS: 300.0000 mg | ORAL_CAPSULE | Freq: Three times a day (TID) | ORAL | 3 refills | Status: DC

## 2021-11-19 MED ORDER — PREDNISONE 50 MG PO TABS: ORAL_TABLET | ORAL | 0 refills | Status: DC

## 2021-11-19 MED ORDER — METHOCARBAMOL 750 MG PO TABS: 750.0000 mg | ORAL_TABLET | Freq: Three times a day (TID) | ORAL | 1 refills | Status: DC | PRN

## 2021-11-19 NOTE — Progress Notes (Signed)
Office Visit Note   Patient: Allen Espinoza           Date of Birth: 21-Sep-1960           MRN: [id number] Visit Date: 11/19/2021              Requested by: [name], [place], [place],  [place] [address] PCP: [name], MD   Assessment & Plan: Visit Diagnoses:  1. Pain in left leg     Plan: He did let me know that he is trying to work on weight loss and joined a gym 2 days ago.  I would like to start him on prednisone 50 mg for 5 days as well as 750 mg methocarbamol and increase his gabapentin to 300 mg 3 times a day.  I offered him physical therapy but he is deferred just to see how the medications help with him and due to the fact that he is going to be going to the gym.  We can reevaluate him in 4 weeks but no x-rays are needed.  All questions and concerns were answered and addressed.  Follow-Up Instructions: Return in about 4 weeks (around 12/17/2021).   Orders:  Orders Placed This Encounter  Procedures   XR HIP UNILAT W OR W/O PELVIS 1V LEFT   XR Lumbar Spine 2-3 Views   Meds ordered this encounter  Medications   predniSONE (DELTASONE) 50 MG tablet    Sig: Take one tablet daily for 5 days.    Dispense:  5 tablet    Refill:  0   methocarbamol (ROBAXIN) 750 MG tablet    Sig: Take 1 tablet (750 mg total) by mouth every 8 (eight) hours as needed for muscle spasms.    Dispense:  60 tablet    Refill:  1   gabapentin (NEURONTIN) 300 MG capsule    Sig: Take 1 capsule (300 mg total) by mouth 3 (three) times daily.    Dispense:  60 capsule    Refill:  3      Procedures: No procedures performed   Clinical Data: No additional findings.   Subjective: Chief Complaint  Patient presents with   Left Hip - Pain  The patient is someone I have not seen in a long period of time.  In 2018 replaced his left hip secondary to significant AVN.  He has gained a lot of weight and his BMI is up to 45.57.  He has been having left hip pain but really numbness and  tingling going down his left leg and thigh and into his foot recently.  Back in May he was started on gabapentin 100 mg 3 times a day.  He used to be on blood thinning medication secondary to DVT.  He does have a [place] filter and they did stop his anticoagulation after significant fall several months ago.  He is getting good sleep at night.  He is not a diabetic.  He is HIV positive but has excellent control of this.  HPI  Review of Systems There is no listed fever, chills, nausea, vomiting  Objective: Vital Signs: Wt (!) 336 lb (152.4 kg)   BMI 45.57 kg/m   Physical Exam He is alert and orient x3 and in no acute distress Ortho Exam Examination of his left hip shows a move smoothly and fluidly with no blocks to rotation and no pain around the hip itself.  His pain seems to be radicular in nature.  He does have  significant obesity.  There is no weakness in his left lower extremity but a positive straight leg raise. Specialty Comments:  No specialty comments available.  Imaging: XR Lumbar Spine 2-3 Views  Result Date: 11/19/2021 2 views of the lumbar spine show normal alignment with just some mild degenerative changes.  XR HIP UNILAT W OR W/O PELVIS 1V LEFT  Result Date: 11/19/2021 An AP pelvis and lateral left hip shows a well-seated total hip arthroplasty with no complicating features.  There is no evidence of loosening or hardware failure.    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 ([place]) 08/19/2016   Morbid obesity ([place]) 08/19/2016   Solitary pulmonary nodule  05/06/2016   Hidradenitis suppurativa 04/27/2016   Chest pain 04/26/2016   Avascular necrosis of bone of hip, 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[place])    left DVT   Diverticulosis    DVT (deep venous thrombosis) (HCC)    legs   Dyspnea    when walking   Dysrhythmia    remembers mother taking about having an irregular rhythm years when he was a child    GERD (gastroesophageal reflux disease)    HIV (human immunodeficiency virus infection) ([place])    Hypertension    Morbid obesity ([place])     Family History  Problem Relation Age of Onset   Heart attack Mother 70   Heart attack Father 101   Prostate cancer Brother    Bone cancer Maternal Aunt    Breast cancer Maternal Aunt    Colon cancer Neg Hx    Esophageal cancer Neg Hx    Stomach cancer Neg Hx    Rectal cancer Neg Hx    Migraines Neg Hx    Headache Neg Hx     Past Surgical History:  Procedure Laterality Date   COLONOSCOPY  2019   DENTAL SURGERY     had teeth pulled   HYDRADENITIS EXCISION Left 10/14/2016   Procedure: WIDE EXCISION HIDRADENITIS LEFT AXILLA;  Surgeon: Coralie Keens, MD;  Location: [facility];  Service: General;  Laterality: Left;   JOINT REPLACEMENT     Left hip Dr. Ninfa Linden 11/29/16   TOTAL HIP ARTHROPLASTY Left 11/29/2016   Procedure: LEFT TOTAL HIP ARTHROPLASTY ANTERIOR APPROACH;  Surgeon: [name], MD;  Location: [facility];  Service:  Orthopedics;  Laterality: Left;   Social History   Occupational History   Occupation: Disability  Tobacco Use   Smoking status: Never  Smokeless tobacco: Never  Vaping Use   Vaping Use: Never used  Substance and Sexual Activity   Alcohol use: No    Comment: prior   Drug use: No    Comment: prior cocaine use, last 2005   Sexual activity: Not Currently    Partners: Female    Birth control/protection: Condom    Comment: declined condoms 05/2020

## 2021-11-29 ENCOUNTER — Telehealth: Payer: Self-pay

## 2021-11-29 NOTE — Telephone Encounter (Signed)
Received message from Janett Billow stating " pt is scheduled to see my Tuesday for hospital f/u. please call patient to advise him that he needs f/u appointment to be with neurosurgery. he does not need f/u with [place] for this and can plan on f/u in 6 months as previously advised ( was just seen 8/3." Contacted pt, LVM per DPR, informing him of her message. Advised to call office back so we are aware he received message. Will also sent pt mychart message.

## 2021-12-04 ENCOUNTER — Inpatient Hospital Stay: Payer: Medicaid Other | Admitting: Adult Health

## 2021-12-05 ENCOUNTER — Other Ambulatory Visit (HOSPITAL_COMMUNITY): Payer: Self-pay

## 2021-12-05 ENCOUNTER — Other Ambulatory Visit: Payer: Self-pay

## 2021-12-05 ENCOUNTER — Emergency Department (EMERGENCY_DEPARTMENT_HOSPITAL)
Admit: 2021-12-05 | Discharge: 2021-12-05 | Disposition: A | Discharge status: Home / Self Care | Payer: Medicaid Other | Attending: Emergency Medicine | Admitting: Emergency Medicine

## 2021-12-05 ENCOUNTER — Emergency Department (HOSPITAL_COMMUNITY)
Admission: EM | Admit: 2021-12-05 | Discharge: 2021-12-05 | Disposition: A | Discharge status: Home / Self Care | Payer: Medicaid Other | Attending: Emergency Medicine | Admitting: Emergency Medicine

## 2021-12-05 ENCOUNTER — Emergency Department (HOSPITAL_COMMUNITY): Payer: Medicaid Other

## 2021-12-05 ENCOUNTER — Encounter (HOSPITAL_COMMUNITY): Payer: Self-pay | Admitting: *Deleted

## 2021-12-05 DIAGNOSIS — M7989 Other specified soft tissue disorders: Secondary | ICD-10-CM | POA: Diagnosis not present

## 2021-12-05 DIAGNOSIS — I82402 Acute embolism and thrombosis of unspecified deep veins of left lower extremity: Secondary | ICD-10-CM | POA: Insufficient documentation

## 2021-12-05 DIAGNOSIS — O2230 Deep phlebothrombosis in pregnancy, unspecified trimester: Secondary | ICD-10-CM

## 2021-12-05 DIAGNOSIS — I6200 Nontraumatic subdural hemorrhage, unspecified: Secondary | ICD-10-CM | POA: Diagnosis not present

## 2021-12-05 DIAGNOSIS — M79605 Pain in left leg: Secondary | ICD-10-CM | POA: Diagnosis not present

## 2021-12-05 DIAGNOSIS — R2242 Localized swelling, mass and lump, left lower limb: Secondary | ICD-10-CM | POA: Diagnosis present

## 2021-12-05 DIAGNOSIS — R42 Dizziness and giddiness: Secondary | ICD-10-CM | POA: Diagnosis not present

## 2021-12-05 NOTE — Discharge Instructions (Signed)
Start back on your blood thinner today.  Follow-up with your family doctor next week to recheck your leg and keep your appointment with the neurosurgeon also

## 2021-12-05 NOTE — ED Triage Notes (Signed)
Due to a fall July 26 th he was taken off his blood thinner due to head bleed on left, he continues to be off thinners due to a bleed on the rt. Now he has pain and swelling to left upper leg.

## 2021-12-05 NOTE — Progress Notes (Signed)
LLE venous duplex has been completed.  Preliminary findings given to Dr. Roderic Palau.   Results can be found under chart review under CV PROC. 12/05/2021 10:10 AM Lorynn Moeser RVT, RDMS

## 2021-12-05 NOTE — ED Provider Notes (Signed)
[name]   CSN: [id number] Arrival date & time: 12/05/21  3267     History  Chief Complaint  Patient presents with   Leg Pain    Allen Espinoza is a 61 y.o. male.  Patient complains of swelling to his left leg.  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  The history is provided by the patient and medical records. No language interpreter was used.  Leg Pain Lower extremity pain location: Swollen left leg. Injury: no   Pain details:    Quality:  Aching   Radiates to:  Does not radiate   Severity:  Moderate   Onset quality:  Sudden   Timing:  Constant Chronicity:  Recurrent Dislocation: no   Foreign body present:  No foreign bodies Associated symptoms: no back pain and no fatigue        Home Medications Prior to Admission medications   Medication Sig Start Date End Date Taking? Authorizing Provider  cabotegravir & rilpivirine ER (CABENUVA) 600 & 900 MG/3ML injection Inject 1 kit into the muscle every 8 (eight) weeks. 06/11/21 06/11/22  Kuppelweiser, [name], RPH-CPP  clonazePAM (KLONOPIN) 0.5 MG tablet Take 2 tablets (1 mg total) by mouth at bedtime. Follow titration instructions provided separately in writing. Patient taking differently: Take 0.5 mg by mouth in the morning, at noon, and at bedtime. 11/01/20   [name], MD  furosemide (LASIX) 40 MG tablet Take 40 mg by mouth daily. 10/28/21   [provider]  gabapentin (NEURONTIN) 300 MG capsule Take 1 capsule (300 mg total) by mouth 3 (three) times daily. 11/19/21   [name], MD  HUMIRA PEN 80 MG/0.8ML PNKT Inject 80 mg into the skin once a week. Tuesday's 08/08/20   [provider]  methocarbamol (ROBAXIN) 750 MG tablet Take 1 tablet (750 mg total) by mouth every 8 (eight) hours as needed for muscle spasms. 11/19/21   [name],  MD  metroNIDAZOLE (FLAGYL) 250 MG tablet Take 1 tablet (250 mg total) by mouth 3 (three) times daily. 05/11/21   [name], MD  olmesartan [facility]) 20 MG tablet Take 1 tablet by mouth once daily 05/11/21   [name], MD  predniSONE (DELTASONE) 50 MG tablet Take one tablet daily for 5 days. 11/19/21   [name], MD      Allergies    Sulfa antibiotics and Vancomycin    Review of Systems   Review of Systems  Constitutional:  Negative for appetite change and fatigue.  HENT:  Negative for congestion, ear discharge and sinus pressure.   Eyes:  Negative for discharge.  Respiratory:  Negative for cough.   Cardiovascular:  Negative for chest pain.  Gastrointestinal:  Negative for abdominal pain and diarrhea.  Genitourinary:  Negative for frequency and hematuria.  Musculoskeletal:  Negative for back pain.       Swollen left leg  Skin:  Negative for rash.  Neurological:  Negative for seizures and headaches.  Psychiatric/Behavioral:  Negative for hallucinations.     Physical Exam Updated Vital Signs BP (!) 155/69 (BP Location: Right Arm)   Pulse (!) 43   Temp 98 F (36.7 C) (Oral) Comment: Simultaneous filing. User may not have seen previous data.  Resp 16   Ht 6' (1.829 m)   Wt (!) 151 kg  SpO2 100%   BMI 45.16 kg/m  Physical Exam Vitals and nursing note reviewed.  Constitutional:      Appearance: He is well-developed.  HENT:     Head: Normocephalic.     Nose: Nose normal.  Eyes:     General: No scleral icterus.    Conjunctiva/sclera: Conjunctivae normal.  Neck:     Thyroid: No thyromegaly.  Cardiovascular:     Rate and Rhythm: Normal rate and regular rhythm.     Heart sounds: No murmur heard.    No friction rub. No gallop.  Pulmonary:     Breath sounds: No stridor. No wheezing or rales.  Chest:     Chest wall: No tenderness.  Abdominal:     General: There is no distension.     Tenderness: There is no abdominal tenderness. There is no  rebound.  Musculoskeletal:        General: Normal range of motion.     Cervical back: Neck supple.     Comments: Swollen left leg and tender  Lymphadenopathy:     Cervical: No cervical adenopathy.  Skin:    Findings: No erythema or rash.  Neurological:     Mental Status: He is oriented to person, place, and time.     Motor: No abnormal muscle tone.     Coordination: Coordination normal.  Psychiatric:        Behavior: Behavior normal.     ED Results / Procedures / Treatments   Labs (all labs ordered are listed, but only abnormal results are displayed) Labs Reviewed - No data to display  EKG None  Radiology CT Head Wo Contrast  Result Date: 12/05/2021 CLINICAL DATA:  Dizziness, persistent/recurrent, cardiac or vascular cause suspected EXAM: CT HEAD WITHOUT CONTRAST TECHNIQUE: Contiguous axial images were obtained from the base of the skull through the vertex without intravenous contrast. RADIATION DOSE REDUCTION: This exam was performed according to the departmental dose-optimization program which includes automated exposure control, adjustment of the mA and/or kV according to patient size and/or use of iterative reconstruction technique. COMPARISON:  CT head 11/12/2021. FINDINGS: Brain: Slightly decreased size of a mixed density right cerebral convexity subdural hematoma which measures up to 6 mm in thickness (previously 8 mm). No significant mass effect. No midline shift. No evidence of acute large vascular territory infarct. No hydrocephalus. Vascular: No hyperdense vessel identified. Skull: No acute fracture. Sinuses/Orbits: Clear visualized sinuses. No acute orbital findings. Other: No mastoid effusions. IMPRESSION: Slightly decreased size of a mixed density right cerebral convexity subdural hematoma which measures up to 6 mm in thickness (previously 8 mm). Electronically Signed   By: Margaretha Sheffield M.D.   On: 12/05/2021 11:01   VAS [place] LOWER EXTREMITY VENOUS (DVT) (ONLY [facility] &  [facility])  Result Date: 12/05/2021  Lower Venous DVT Study Patient Name:  Allen Espinoza  Date of Exam:   12/05/2021 Medical Rec #: [id number]      Accession #:    [id number] Date of Birth: 04/07/1960      Patient Gender: M Patient Age:   61 years Exam Location:  [facility] Procedure:      VAS [place] LOWER EXTREMITY VENOUS (DVT) Referring Phys: Broadus John Zacchaeus Halm --------------------------------------------------------------------------------  Indications: Left thigh pain & swelling. Other Indications: Patient with history of DVT (IVC filter in place) was taken                    off blood thinners due to bleed after fall on 10/24/21.  Comparison Study: Previous exam (BLE reflux) was negative for DVT. Performing Technologist: Rogelia Rohrer RVT, RDMS  Examination Guidelines: A complete evaluation includes B-mode imaging, spectral Doppler, color Doppler, and power Doppler as needed of all accessible portions of each vessel. Bilateral testing is considered an integral part of a complete examination. Limited examinations for reoccurring indications may be performed as noted. The reflux portion of the exam is performed with the patient in reverse Trendelenburg.  +-----+---------------+---------+-----------+----------+--------------+ RIGHTCompressibilityPhasicitySpontaneityPropertiesThrombus Aging +-----+---------------+---------+-----------+----------+--------------+ CFV  Full           Yes      Yes                                 +-----+---------------+---------+-----------+----------+--------------+   +---------+---------------+---------+-----------+----------+-----------------+ LEFT     CompressibilityPhasicitySpontaneityPropertiesThrombus Aging    +---------+---------------+---------+-----------+----------+-----------------+ CFV      Partial        Yes      Yes                  Acute             +---------+---------------+---------+-----------+----------+-----------------+ SFJ      Partial                                       Acute             +---------+---------------+---------+-----------+----------+-----------------+ FV Prox  None           No       No                   Age Indeterminate +---------+---------------+---------+-----------+----------+-----------------+ FV Mid   None           No       No                   Age Indeterminate +---------+---------------+---------+-----------+----------+-----------------+ FV DistalFull           Yes      Yes                                    +---------+---------------+---------+-----------+----------+-----------------+ PFV      Full                                                           +---------+---------------+---------+-----------+----------+-----------------+ POP      Partial        Yes      Yes                  Acute             +---------+---------------+---------+-----------+----------+-----------------+ PTV      Full                                                           +---------+---------------+---------+-----------+----------+-----------------+ PERO  not visualized    +---------+---------------+---------+-----------+----------+-----------------+   Left Technical Findings: Not visualized segments include peroneal veins.   Summary: RIGHT: - No evidence of common femoral vein obstruction.  LEFT: - Findings consistent with acute deep vein thrombosis involving the left common femoral vein, SF junction, and left popliteal vein. - Findings consistent with age indeterminate deep vein thrombosis involving the left femoral vein. - No cystic structure found in the popliteal fossa.  *See table(s) above for measurements and observations.    Preliminary     Procedures Procedures    Medications Ordered in ED Medications - No data to display  ED Course/ Medical Decision Making/ A&P  I spoke to neurosurgery and they requested another CT of his head.  That  was done which showed improvement of his subdural from last month.  Neurosurgery said go ahead and start back on Xarelto for his DVT                         Medical Decision Making Amount and/or Complexity of Data Reviewed Radiology: ordered.  This patient presents to the ED for concern of fall and left leg, this involves an extensive number of treatment options, and is a complaint that carries with it a high risk of complications and morbidity.  The differential diagnosis includes DVT cellulitis   Co morbidities that complicate the patient evaluation  History of DVTs and head injury   Additional history obtained:  Additional history obtained from patient External records from outside source obtained and reviewed including hospital records   Lab Tests:  I Ordered, and personally interpreted labs.  The pertinent results include: No labs done   Imaging Studies ordered:  I ordered imaging studies including ultrasound left lower leg and CT head I independently visualized and interpreted imaging which showed DVT left lower leg and old head injury I agree with the radiologist interpretation   Cardiac Monitoring: / EKG:  The patient was maintained on a cardiac monitor.  I personally viewed and interpreted the cardiac monitored which showed an underlying rhythm of: Normal sinus rhythm   Consultations Obtained:  I requested consultation with the neurosurgery,  and discussed lab and imaging findings as well as pertinent plan - they recommend: Patient can be started back on Xarelto   Problem List / ED Course / Critical interventions / Medication management  DVTs and head injury I ordered medication including Xarelto Reevaluation of the patient after these medicines showed that the patient stayed the same I have reviewed the patients home medicines and have made adjustments as needed   Social Determinants of Health:  None   Test / Admission - Considered:  No additional  test needed  Patient with DVT left leg.  He is started back on his Xarelto and will follow-up with his doctor.  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

## 2021-12-06 ENCOUNTER — Telehealth: Payer: Self-pay | Admitting: *Deleted

## 2021-12-06 ENCOUNTER — Other Ambulatory Visit (HOSPITAL_COMMUNITY): Payer: Self-pay

## 2021-12-06 DIAGNOSIS — G4733 Obstructive sleep apnea (adult) (pediatric): Secondary | ICD-10-CM | POA: Diagnosis not present

## 2021-12-06 NOTE — Patient Outreach (Signed)
Care Coordination  12/06/2021  Allen Espinoza [birth year] [id number]  Transition Care Management Follow-up Telephone Call Date of discharge and from where: 12/05/21 from [facility] How have you been since you were released from the hospital? "I'm doing a little better" Any questions or concerns? No  Items Reviewed: Did the pt receive and understand the discharge instructions provided? Yes  Medications obtained and verified? Yes  Other? No  Any new allergies since your discharge? No  Dietary orders reviewed? Yes Do you have support at home? Yes   Home Care and Equipment/Supplies: Were home health services ordered? no If so, what is the name of the agency? N/A  Has the agency set up a time to come to the patient's home? not applicable Were any new equipment or medical supplies ordered?  No What is the name of the medical supply agency? N/A Were you able to get the supplies/equipment? not applicable Do you have any questions related to the use of the equipment or supplies? No  Functional Questionnaire: (I = Independent and D = Dependent) ADLs: I  Bathing/Dressing- I  Meal Prep- I  Eating- I  Maintaining continence- I  Transferring/Ambulation- I  Managing Meds- I  Follow up appointments reviewed:  PCP Hospital f/u appt confirmed?  N/A ED visit  Patient states he will call and schedule with PCP [facility] f/u appt confirmed? Yes  Scheduled to see Neurology on 12/13/21. Are transportation arrangements needed? No  If their condition worsens, is the pt aware to call PCP or go to the Emergency Dept.? Yes Was the patient provided with contact information for the PCP's office or ED? Yes Was to pt encouraged to call back with questions or concerns? Yes  Lurena Joiner RN, BSN [place]  [employer] manager

## 2021-12-07 ENCOUNTER — Other Ambulatory Visit (HOSPITAL_COMMUNITY): Payer: Self-pay

## 2021-12-07 ENCOUNTER — Telehealth: Payer: Self-pay

## 2021-12-07 NOTE — Telephone Encounter (Signed)
[facility] Patient Advocate Encounter  Patient's medication [name] have been couriered to [facility] from [place] and will be administered on the patient next office visit on 12/12/21.  [name] , [facility] Patient [facility] Phone: [phone] Fax:  [phone]

## 2021-12-12 ENCOUNTER — Ambulatory Visit (INDEPENDENT_AMBULATORY_CARE_PROVIDER_SITE_OTHER): Payer: Medicaid Other | Admitting: Family

## 2021-12-12 ENCOUNTER — Other Ambulatory Visit: Payer: Self-pay

## 2021-12-12 ENCOUNTER — Encounter: Payer: Self-pay | Admitting: Family

## 2021-12-12 VITALS — BP 147/74 | HR 45 | Temp 97.3°F | Ht 72.0 in | Wt 338.0 lb

## 2021-12-12 DIAGNOSIS — Z0000 Encounter for general adult medical examination without abnormal findings: Secondary | ICD-10-CM

## 2021-12-12 DIAGNOSIS — N1831 Chronic kidney disease, stage 3a: Secondary | ICD-10-CM

## 2021-12-12 DIAGNOSIS — Z21 Asymptomatic human immunodeficiency virus [HIV] infection status: Secondary | ICD-10-CM

## 2021-12-12 MED ORDER — CABOTEGRAVIR & RILPIVIRINE ER 600 & 900 MG/3ML IM SUER
1.0000 | Freq: Once | INTRAMUSCULAR | Status: AC
  Administered 2021-12-12: 1 via INTRAMUSCULAR

## 2021-12-12 NOTE — Addendum Note (Signed)
Addended by: Adelfa Koh on: 12/12/2021 11:02 AM   Modules accepted: Orders

## 2021-12-12 NOTE — Assessment & Plan Note (Signed)
Mr. Hartstein renal function remains stable with most recent GFR of 54. Continue management of comorbid conditions.

## 2021-12-12 NOTE — Assessment & Plan Note (Signed)
Allen Espinoza continues to have well controlled virus with good adherence and tolerance to [place]. Reviewed previous lab work and discussed plan of care. Continue current dose of Cabenuva. Lab work at next office visit. Plan for follow up in 2 months for next injection and 4 months for routine follow up.

## 2021-12-12 NOTE — Assessment & Plan Note (Signed)
   Discussed importance of safe sexual practice and condom use. Condoms and STD testing offered.   Due for influenza vaccination once available.   Routine colon cancer screening is up to date.

## 2021-12-12 NOTE — Patient Instructions (Signed)
Nice to see you.  Plan for follow up in 2 months for next injection or sooner if needed with lab work on the same day.  Have a great day and stay safe!

## 2021-12-12 NOTE — Progress Notes (Signed)
Brief Narrative   Patient ID: Allen Espinoza, male    DOB: 05-06-1960, 61 y.o.   MRN: [id number]  Allen Espinoza is a 61 y/o male with HIV disease diagnosed in 2007 with risk factor of heterosexual contact. Initial viral load and CD4 count are unavailable.Genotype with no medication resistant mutations. No history of opportunistic infection. ART history with Genvoya and Prezista (DDI with Xarelto) and Biktarvy. Now on Cabenuva.  Subjective:    Chief Complaint  Patient presents with   Follow-up    HPI:  Allen Espinoza is a 61 y.o. male with HIV disease last seen on 10/18/21 by Alfonse Espinoza, PharmD, CPP for q 2 month Cabenuva. Virus was well controlled with viral load undetectable and CD4 count 441 in March 2023. In the interim he has experienced a fall and being followed for subdural hematoma. Here today for routine follow up.  Allen Espinoza has been doing okay since his last office visit. Has an appointment with Neurosurgery for follow up of subdural hematoma that he sustained following a fall down stairs. Most recent CT scan with small improvement in size of hematoma. No problems with Cabenuva and otherwise feeling well. Denies fevers, chills, night sweats, headaches, changes in vision, neck pain/stiffness, nausea, diarrhea, vomiting, lesions or rashes. Condoms and STD testing offered. Routine vaccinations up to date and will be due for flu vaccine.    Allergies  Allergen Reactions   Sulfa Antibiotics Other (See Comments)    Low blood pressure   Vancomycin Itching    Give with benadryl      Outpatient Medications Prior to Visit  Medication Sig Dispense Refill   cabotegravir & rilpivirine ER (CABENUVA) 600 & 900 MG/3ML injection Inject 1 kit into the muscle every 8 (eight) weeks. 6 mL 5   clonazePAM (KLONOPIN) 0.5 MG tablet Take 2 tablets (1 mg total) by mouth at bedtime. Follow titration instructions provided separately in writing. (Patient taking differently: Take 0.5 mg by mouth in the  morning, at noon, and at bedtime.) 60 tablet 3   furosemide (LASIX) 40 MG tablet Take 40 mg by mouth daily.     gabapentin (NEURONTIN) 300 MG capsule Take 1 capsule (300 mg total) by mouth 3 (three) times daily. 60 capsule 3   HUMIRA PEN 80 MG/0.8ML PNKT Inject 80 mg into the skin once a week. Tuesday's     methocarbamol (ROBAXIN) 750 MG tablet Take 1 tablet (750 mg total) by mouth every 8 (eight) hours as needed for muscle spasms. 60 tablet 1   metroNIDAZOLE (FLAGYL) 250 MG tablet Take 1 tablet (250 mg total) by mouth 3 (three) times daily. 270 tablet 3   olmesartan (BENICAR) 20 MG tablet Take 1 tablet by mouth once daily 90 tablet 3   predniSONE (DELTASONE) 50 MG tablet Take one tablet daily for 5 days. (Patient not taking: Reported on 12/12/2021) 5 tablet 0   No facility-administered medications prior to visit.     Past Medical History:  Diagnosis Date   Abscess    Between legs   Arthritis    all over   Asthma    Avascular necrosis of femoral head, left (HCC)    Blood transfusion without reported diagnosis    Cholelithiasis    CKD (chronic kidney disease) stage 3, GFR 30-59 ml/min (HCC)    sees kidney Dr.   [facility] [employer] disorder [facility])    left DVT   Diverticulosis    DVT (deep venous thrombosis) (HCC)    legs  Dyspnea    when walking   Dysrhythmia    remembers mother taking about having an irregular rhythm years when he was a child    GERD (gastroesophageal reflux disease)    HIV (human immunodeficiency virus infection) ([place])    Hypertension    Morbid obesity ([place])      Past Surgical History:  Procedure Laterality Date   COLONOSCOPY  2019   DENTAL SURGERY     had teeth pulled   HYDRADENITIS EXCISION Left 10/14/2016   Procedure: WIDE EXCISION HIDRADENITIS LEFT AXILLA;  Surgeon: Coralie Keens, MD;  Location: [place];  Service: General;  Laterality: Left;   JOINT REPLACEMENT     Left hip Dr. Ninfa Linden 11/29/16   TOTAL HIP ARTHROPLASTY Left 11/29/2016   Procedure: LEFT  TOTAL HIP ARTHROPLASTY ANTERIOR APPROACH;  Surgeon: [name], MD;  Location: [facility];  Service: Orthopedics;  Laterality: Left;      Review of Systems  Constitutional:  Negative for appetite change, chills, fatigue, fever and unexpected weight change.  Eyes:  Negative for visual disturbance.  Respiratory:  Negative for cough, chest tightness, shortness of breath and wheezing.   Cardiovascular:  Negative for chest pain and leg swelling.  Gastrointestinal:  Negative for abdominal pain, constipation, diarrhea, nausea and vomiting.  Genitourinary:  Negative for dysuria, flank pain, frequency, genital sores, hematuria and urgency.  Skin:  Negative for rash.  Allergic/Immunologic: Negative for immunocompromised state.  Neurological:  Negative for dizziness and headaches.      Objective:    BP (!) 147/74   Pulse (!) 45   Temp (!) 97.3 F (36.3 C) (Oral)   Ht 6' (1.829 m)   Wt (!) 338 lb (153.3 kg)   SpO2 99%   BMI 45.84 kg/m  Nursing note and vital signs reviewed.  Physical Exam Constitutional:      General: He is not in acute distress.    Appearance: He is well-developed.  Eyes:     Conjunctiva/sclera: Conjunctivae normal.  Cardiovascular:     Rate and Rhythm: Normal rate and regular rhythm.     Heart sounds: Normal heart sounds. No murmur heard.    No friction rub. No gallop.  Pulmonary:     Effort: Pulmonary effort is normal. No respiratory distress.     Breath sounds: Normal breath sounds. No wheezing or rales.  Chest:     Chest wall: No tenderness.  Abdominal:     General: Bowel sounds are normal.     Palpations: Abdomen is soft.     Tenderness: There is no abdominal tenderness.  Musculoskeletal:     Cervical back: Neck supple.  Lymphadenopathy:     Cervical: No cervical adenopathy.  Skin:    General: Skin is warm and dry.     Findings: No rash.  Neurological:     Mental Status: He is alert and oriented to person, place, and time.  Psychiatric:         Behavior: Behavior normal.        Thought Content: Thought content normal.        Judgment: Judgment normal.         12/12/2021    8:32 AM 06/21/2021    9:47 AM 09/28/2020    5:10 PM 08/17/2020    8:54 AM 06/16/2020    8:48 AM  Depression screen PHQ 2/9  Decreased Interest 0 0 0 0 0  Down, Depressed, Hopeless 0 0 0 0 0  PHQ - 2 Score 0 0  0 0 0       Assessment & Plan:    Patient Active Problem List   Diagnosis Date Noted   Subdural hematoma ([place]) 11/12/2021   Asthma    GERD (gastroesophageal reflux disease)    SDH (subdural hematoma) ([place]) 10/24/2021   Multiple lipomas 03/10/2021   Healthcare maintenance 10/13/2020   Numbness 09/28/2020   Subcutaneous nodules 03/01/2019   Hypertension 10/19/2018   Sleep disorder 03/05/2018   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 ([place]) 08/19/2016   Morbid obesity ([place]) 08/19/2016   Solitary pulmonary nodule 05/06/2016   Hidradenitis suppurativa 04/27/2016   Chest pain 04/26/2016   Avascular necrosis of bone of hip, left s/p TRH ([place]) 03/19/2016   HIV (human immunodeficiency virus infection) ([place]) 03/18/2016   DVT (deep venous thrombosis) ([place]) 03/18/2016   Chronic kidney disease, stage 3a ([place]) 03/18/2016   Cholelithiasis 03/18/2016   Diverticulosis of colon without hemorrhage 03/18/2016     Problem List Items Addressed This Visit       Genitourinary   Chronic kidney disease, stage 3a [facility]    Allen Espinoza renal function remains stable with most recent GFR of 54. Continue management of comorbid conditions.         Other   HIV (human immunodeficiency virus infection) [facility])    Allen Espinoza continues to have well controlled virus with good adherence and tolerance to [place]. Reviewed previous lab work and discussed plan of care. Continue current dose of Cabenuva. Lab work at next office visit. Plan for follow up in 2 months for next injection and 4 months for routine follow up.       Healthcare  maintenance - Primary    Discussed importance of safe sexual practice and condom use. Condoms and STD testing offered.  Due for influenza vaccination once available.  Routine colon cancer screening is up to date.          I am having Allen Espinoza maintain his Humira Pen, clonazePAM, olmesartan, metroNIDAZOLE, cabotegravir & rilpivirine ER, furosemide, predniSONE, methocarbamol, and gabapentin.   Follow-up: Return in about 2 months (around 02/11/2022), or if symptoms worsen or fail to improve.   Terri Piedra, MSN, FNP-C Nurse Practitioner [facility] for Infectious Disease [place] number: [phone]

## 2021-12-13 DIAGNOSIS — S065XAA Traumatic subdural hemorrhage with loss of consciousness status unknown, initial encounter: Secondary | ICD-10-CM | POA: Diagnosis not present

## 2021-12-13 DIAGNOSIS — Z6841 Body Mass Index (BMI) 40.0 and over, adult: Secondary | ICD-10-CM | POA: Diagnosis not present

## 2021-12-14 ENCOUNTER — Other Ambulatory Visit: Payer: Self-pay | Admitting: Neurological Surgery

## 2021-12-14 DIAGNOSIS — S065XAA Traumatic subdural hemorrhage with loss of consciousness status unknown, initial encounter: Secondary | ICD-10-CM

## 2021-12-17 ENCOUNTER — Ambulatory Visit (INDEPENDENT_AMBULATORY_CARE_PROVIDER_SITE_OTHER): Payer: Medicaid Other | Admitting: Orthopaedic Surgery

## 2021-12-17 ENCOUNTER — Other Ambulatory Visit: Payer: Self-pay

## 2021-12-17 ENCOUNTER — Encounter: Payer: Self-pay | Admitting: Orthopaedic Surgery

## 2021-12-17 DIAGNOSIS — M79605 Pain in left leg: Secondary | ICD-10-CM

## 2021-12-17 NOTE — Progress Notes (Signed)
The patient comes in today due to continued left lower extremity swelling and pain.  I replaced his left hip remotely.  He did let me know that he has found out that he has a left-sided DVT from the groin down to near his knee.  That is being diagnosed recently.  He is on Xarelto.  He also has a chronic [place] filter.  At his last visit his x-rays of his left hip looked good and there was no significant findings on his lumbar spine plain films.  He understands that I would like to refer him to vascular surgery to see if there are any other treatment options for him as a relates to this extensive DVT.  He is wearing compressive socks as well.

## 2021-12-18 DIAGNOSIS — D485 Neoplasm of uncertain behavior of skin: Secondary | ICD-10-CM | POA: Diagnosis not present

## 2021-12-18 DIAGNOSIS — Z23 Encounter for immunization: Secondary | ICD-10-CM | POA: Diagnosis not present

## 2021-12-18 DIAGNOSIS — M5459 Other low back pain: Secondary | ICD-10-CM | POA: Diagnosis not present

## 2021-12-28 NOTE — Unmapped (Signed)
Dustin Prince reports his HS is stable - he continues to have some draining of his lesions and he met with urology a few months back to discuss surgery. He currently has an active blood clot, so it's unlikely that he'll be able to come off Xarelto. I'll tag clinic to see if they see other details.    [facility] Clinical Assessment & Refill Coordination Note    Dustin Prince, [name]: 15-Mar-1961  Phone: [phone] (home)     All above HIPAA information was verified with patient.     Was a Nurse, learning disability used for this call? No    Specialty Medication(s):   Inflammatory Disorders: Humira     Current Outpatient Medications   Medication Sig Dispense Refill    adalimumab (HUMIRA,CF, PEN) 80 mg/0.8 mL PnKt Inject the contents of 1 pen (80 mg total) under the skin every seven (7) days. 12 each 3    albuterol HFA 90 mcg/actuation inhaler Inhale 2 puffs.      bictegrav-emtricit-tenofov ala (BIKTARVY) 50-200-25 mg tablet Take 1 tablet by mouth daily.      CLONAZEPAM ORAL Take by mouth.      cyclobenzaprine (FLEXERIL) 5 MG tablet TAKE 1 TABLET BY MOUTH AT BEDTIME      furosemide (LASIX) 20 MG tablet Take 20 mg by mouth daily.      HYDROcodone-acetaminophen (NORCO) 5-325 mg per tablet Take 1-2 tablets every 6 hours as needed for pain 15 tablet 0    losartan (COZAAR) 50 MG tablet       metroNIDAZOLE (FLAGYL) 250 MG tablet TAKE 1 TABLET BY MOUTH THREE TIMES DAILY 90 tablet 0    pantoprazole (PROTONIX) 40 MG tablet Take 40 mg by mouth daily.      rivaroxaban (XARELTO) 20 mg tablet Take 20 mg by mouth daily with evening meal.       No current facility-administered medications for this visit.        Changes to medications: Dustin Prince reports no changes at this time.    Allergies   Allergen Reactions    Sulfasalazine      Other reaction(s): Other (See Comments)  Low blood pressure    Vancomycin Itching     Give with benadryl       Changes to allergies: No    SPECIALTY MEDICATION ADHERENCE     Humira - 1 left    Medication Adherence    Patient reported X missed doses in the last month: 0  Specialty Medication: Humira            Support network for adherence: family member                Specialty medication(s) dose(s) confirmed: Regimen is correct and unchanged.     Are there any concerns with adherence? No    Adherence counseling provided? Not needed    CLINICAL MANAGEMENT AND INTERVENTION      Clinical Benefit Assessment:    Do you feel the medicine is effective or helping your condition? Yes    Clinical Benefit counseling provided? Not needed    Adverse Effects Assessment:    Are you experiencing any side effects? No    Are you experiencing difficulty administering your medicine? No    Quality of Life Assessment:    Quality of Life    Rheumatology  Oncology  Dermatology  1. What impact has your specialty medication had on the symptoms of your skin condition (i.e. itchiness, soreness, stinging)?: Some  2.  What impact has your specialty medication had on your comfort level with your skin?: Some  Cystic Fibrosis          Have you discussed this with your provider? Not needed    Acute Infection Status:    Acute infections noted within Epic:  No active infections  Patient reported infection: None    Therapy Appropriateness:    Is therapy appropriate and patient progressing towards therapeutic goals? Yes, therapy is appropriate and should be continued    DISEASE/MEDICATION-SPECIFIC INFORMATION      For patients on injectable medications: Patient currently has 1 doses left.  Next injection is scheduled for Tues, 10/3.    PATIENT SPECIFIC NEEDS     Does the patient have any physical, cognitive, or cultural barriers? No    Is the patient high risk? No    Does the patient require a Care Management Plan? No     SOCIAL DETERMINANTS OF HEALTH     At the [facility], we have learned that life circumstances - like trouble affording food, housing, utilities, or transportation can affect the health of many of our patients. That is why we wanted to ask: are you currently experiencing any life circumstances that are negatively impacting your health and/or quality of life? Patient declined to answer    Social Determinants of Health     Financial Resource Strain: Not on file   Internet Connectivity: Not on file   Food Insecurity: Not on file   Tobacco Use: Low Risk  (10/11/2021)    Patient History     Smoking Tobacco Use: Never     Smokeless Tobacco Use: Never     Passive Exposure: Not on file   Housing/Utilities: Not on file   Alcohol Use: Not on file   Transportation Needs: Not on file   Substance Use: Not on file   Health Literacy: Not on file   Physical Activity: Not on file   Interpersonal Safety: Not on file   Stress: Not on file   Intimate Partner Violence: Not on file   Depression: Not on file   Social Connections: Not on file       Would you be willing to receive help with any of the needs that you have identified today? Not applicable       SHIPPING     Specialty Medication(s) to be Shipped:   Inflammatory Disorders: Humira    Other medication(s) to be shipped: No additional medications requested for fill at this time     Changes to insurance: No    Delivery Scheduled: Yes, Expected medication delivery date: 10/4.     Medication will be delivered via UPS to the confirmed prescription address in [facility].    The patient will receive a drug information handout for each medication shipped and additional FDA Medication Guides as required.  Verified that patient has previously received a Conservation officer, historic buildings and a Surveyor, mining.    The patient or caregiver noted above participated in the development of this care plan and knows that they can request review of or adjustments to the care plan at any time.      All of the patient's questions and concerns have been addressed.    Lanney Gins   [facility] Specialty Pharmacist

## 2022-01-01 MED FILL — HUMIRA(CF) PEN 80 MG/0.8 ML SUBCUTANEOUS KIT: SUBCUTANEOUS | 84 days supply | Qty: 12 | Fill #1

## 2022-01-02 ENCOUNTER — Encounter (HOSPITAL_COMMUNITY): Payer: Self-pay | Admitting: *Deleted

## 2022-01-02 ENCOUNTER — Other Ambulatory Visit: Payer: Self-pay

## 2022-01-02 ENCOUNTER — Emergency Department (HOSPITAL_COMMUNITY)
Admission: EM | Admit: 2022-01-02 | Discharge: 2022-01-02 | Disposition: A | Discharge status: Home / Self Care | Payer: Medicaid Other | Attending: Emergency Medicine | Admitting: Emergency Medicine

## 2022-01-02 DIAGNOSIS — N189 Chronic kidney disease, unspecified: Secondary | ICD-10-CM | POA: Insufficient documentation

## 2022-01-02 DIAGNOSIS — I82492 Acute embolism and thrombosis of other specified deep vein of left lower extremity: Secondary | ICD-10-CM | POA: Diagnosis not present

## 2022-01-02 DIAGNOSIS — Z7901 Long term (current) use of anticoagulants: Secondary | ICD-10-CM | POA: Insufficient documentation

## 2022-01-02 DIAGNOSIS — Z21 Asymptomatic human immunodeficiency virus [HIV] infection status: Secondary | ICD-10-CM | POA: Diagnosis not present

## 2022-01-02 DIAGNOSIS — I82402 Acute embolism and thrombosis of unspecified deep veins of left lower extremity: Secondary | ICD-10-CM | POA: Diagnosis not present

## 2022-01-02 DIAGNOSIS — M79605 Pain in left leg: Secondary | ICD-10-CM

## 2022-01-02 NOTE — ED Provider Notes (Signed)
[place] DEPT Provider Note   CSN: [id number] Arrival date & time: 01/02/22  0932     History  Chief Complaint  Patient presents with   Blood Clots in Leg    Allen Espinoza is a 61 y.o. male.  Patient is a 61 year old male with a history of HIV, recurrent DVTs with a [place] filter and currently on Xarelto with known new clot in the left groin and behind his knee, CKD who is presenting today due to ongoing pain in his left leg.  Patient reports now for a month he has been back on Xarelto which he had initially been discontinued on because he had fallen and had a head bleed.  It was while he was off anticoagulation that he developed a new clot.  He has had ongoing pain in the thigh, swelling and difficulty with mobility.  He saw his orthopedist Dr. Ninfa Linden because he thought it may be related to his hip replacement but reports everything with his hip look fine and they felt the pain was coming from the known DVT.  He did follow-up with [place] vein specialist for consultation and is supposed to return on 10/18 for a specialized ultrasound to evaluate for flow.  He has continued to wear his compression socks and has tried to elevate it but reports every time he gets up and tries to move around he just has significant pain.  He has been taking gabapentin 300 mg and then occasionally takes Tylenol but not regularly.  He has not tried anything else for pain.  He reports he came in today just because the pain is not improving and it is difficult to get around but there is nothing specifically new today about his symptoms.  He is not having any chest pain or shortness of breath.  He does take the Xarelto with food every evening and has not missed any doses.  The history is provided by the patient and medical records.       Home Medications Prior to Admission medications   Medication Sig Start Date End Date Taking? Authorizing Provider  cabotegravir &  rilpivirine ER (CABENUVA) 600 & 900 MG/3ML injection Inject 1 kit into the muscle every 8 (eight) weeks. 06/11/21 06/11/22  Kuppelweiser, [name], RPH-CPP  clonazePAM (KLONOPIN) 0.5 MG tablet Take 2 tablets (1 mg total) by mouth at bedtime. Follow titration instructions provided separately in writing. Patient taking differently: Take 0.5 mg by mouth in the morning, at noon, and at bedtime. 11/01/20   [name], MD  furosemide (LASIX) 40 MG tablet Take 40 mg by mouth daily. 10/28/21   [provider]  gabapentin (NEURONTIN) 300 MG capsule Take 1 capsule (300 mg total) by mouth 3 (three) times daily. 11/19/21   [name], MD  HUMIRA PEN 80 MG/0.8ML PNKT Inject 80 mg into the skin once a week. Tuesday's 08/08/20   [provider]  methocarbamol (ROBAXIN) 750 MG tablet Take 1 tablet (750 mg total) by mouth every 8 (eight) hours as needed for muscle spasms. 11/19/21   [name], MD  metroNIDAZOLE (FLAGYL) 250 MG tablet Take 1 tablet (250 mg total) by mouth 3 (three) times daily. 05/11/21   [name], MD  olmesartan [facility]) 20 MG tablet Take 1 tablet by mouth once daily 05/11/21   [name], MD  predniSONE (DELTASONE) 50 MG tablet Take one tablet daily for 5 days. Patient not taking: Reported on 12/12/2021 11/19/21   Jean Rosenthal  Y, MD      Allergies    Sulfa antibiotics and Vancomycin    Review of Systems   Review of Systems  Physical Exam Updated Vital Signs BP (!) 158/68   Pulse (!) 54   Temp 97.6 F (36.4 C) (Oral)   Resp 19   Ht 6' (1.829 m)   Wt (!) 149.7 kg   SpO2 99%   BMI 44.76 kg/m  Physical Exam Vitals and nursing note reviewed.  Constitutional:      General: He is not in acute distress.    Appearance: He is well-developed.  HENT:     Head: Normocephalic and atraumatic.  Eyes:     Conjunctiva/sclera: Conjunctivae normal.     Pupils: Pupils are equal, round, and reactive to light.  Cardiovascular:      Rate and Rhythm: Normal rate.     Pulses: Normal pulses.  Pulmonary:     Effort: Pulmonary effort is normal. No respiratory distress.     Breath sounds: Normal breath sounds. No wheezing or rales.  Abdominal:     General: There is no distension.     Palpations: Abdomen is soft.     Tenderness: There is no abdominal tenderness. There is no guarding or rebound.  Musculoskeletal:        General: Tenderness present. Normal range of motion.     Left lower leg: Edema present.     Comments: Notable edema in the left thigh and lower leg compared to the right.  2+ pulse.  No significant color change.  Sensation is intact.  Mild pain with hip flexion but full range of motion.  Skin:    General: Skin is warm and dry.     Findings: No erythema or rash.  Neurological:     Mental Status: He is alert and oriented to person, place, and time. Mental status is at baseline.  Psychiatric:        Behavior: Behavior normal.     ED Results / Procedures / Treatments   Labs (all labs ordered are listed, but only abnormal results are displayed) Labs Reviewed - No data to display  EKG None  Radiology No results found.  Procedures Procedures    Medications Ordered in ED Medications - No data to display  ED Course/ Medical Decision Making/ A&P                           Medical Decision Making  Pt with multiple medical problems and comorbidities and presenting today with a complaint that caries a high risk for morbidity and mortality.  Here today with complaint of ongoing leg swelling and pain.  This is in the setting of a known DVT that was found at the beginning of September approximately 1 month ago.  The DVT most likely occurred because patient had to discontinue his Xarelto after having a fall at the beginning of August with a subdural hemorrhage.  He has now been back on Xarelto for approximately 1 month.  However he is still having a lot of pain and swelling in his leg.  When speaking with the  patient it does not seem that today the pain is any different than what it has been.  He has no new shortness of breath or chest pain.  He does have a filter.  He is followed up with [facility] for vein restoration with Dr. Renaldo Reel and has planned for a ultrasound on 1018.  He  does not feel like he can wait that long due to the ongoing pain.  He is taking gabapentin but nothing else for his pain.  At this time low suspicion for change in clot, new infection or arterial insufficiency.  He is wearing his compression socks and elevating his leg.  Discussed with him taking Tylenol regularly, Voltaren gel and spoke with the office and they will try to get him a sooner appointment.  At this time patient is stable for discharge.  No need for any further imaging or lab test.         Final Clinical Impression(s) / ED Diagnoses Final diagnoses:  Leg pain, left  Acute deep vein thrombosis (DVT) of other specified vein of left lower extremity [facility])    Rx / DC Orders ED Discharge Orders     None         Blanchie Dessert, MD 01/02/22 1053

## 2022-01-02 NOTE — ED Triage Notes (Signed)
Pt has a history of blood clots in his legs, pt presently has one in hius left groin and behind knee, remains on blood thinners. Having pain in leg

## 2022-01-02 NOTE — Discharge Instructions (Addendum)
Start taking extra strength Tylenol every 6 hours for the pain.  Also get over-the-counter Voltaren or diclofenac gel and you can put in on up to 4 times a day.  Just rub it where it hurts.

## 2022-01-04 ENCOUNTER — Ambulatory Visit
Admission: RE | Admit: 2022-01-04 | Discharge: 2022-01-04 | Disposition: A | Discharge status: Home / Self Care | Payer: Medicaid Other | Source: Ambulatory Visit | Attending: Neurological Surgery | Admitting: Neurological Surgery

## 2022-01-04 DIAGNOSIS — S065XAA Traumatic subdural hemorrhage with loss of consciousness status unknown, initial encounter: Secondary | ICD-10-CM

## 2022-01-05 DIAGNOSIS — G4733 Obstructive sleep apnea (adult) (pediatric): Secondary | ICD-10-CM | POA: Diagnosis not present

## 2022-01-08 DIAGNOSIS — S065XAA Traumatic subdural hemorrhage with loss of consciousness status unknown, initial encounter: Secondary | ICD-10-CM | POA: Diagnosis not present

## 2022-01-10 ENCOUNTER — Telehealth: Payer: Self-pay | Admitting: Licensed Clinical Social Worker

## 2022-01-10 DIAGNOSIS — R42 Dizziness and giddiness: Secondary | ICD-10-CM | POA: Diagnosis not present

## 2022-01-10 NOTE — Patient Outreach (Signed)
  Care Coordination [facility] Note Transition Care Management Follow-up Telephone Call Date of discharge and from where: 01/02/22 from [place] How have you been since you were released from the hospital? I am still in a lot of pain Any questions or concerns? No  Items Reviewed: Did the pt receive and understand the discharge instructions provided? Yes  Medications obtained and verified?  No meds ordered Other? No  Any new allergies since your discharge? No  Dietary orders reviewed? No Do you have support at home? Yes   Home Care and Equipment/Supplies: Were home health services ordered? no If so, what is the name of the agency? NA  Has the agency set up a time to come to the patient's home? not applicable Were any new equipment or medical supplies ordered?  No What is the name of the medical supply agency? NA Were you able to get the supplies/equipment? not applicable Do you have any questions related to the use of the equipment or supplies? NA  Functional Questionnaire: (I = Independent and D = Dependent) ADLs: I  Bathing/Dressing- I  Meal Prep- I  Eating- I  Maintaining continence- I  Transferring/Ambulation- I  Managing Meds- I  Follow up appointments reviewed:  PCP Hospital f/u appt confirmed? Yes  Patient is completing PCP visit today [facility] f/u appt confirmed? Yes  01/11/22 Are transportation arrangements needed? No  If their condition worsens, is the pt aware to call PCP or go to the Emergency Dept.? Yes Was the patient provided with contact information for the PCP's office or ED? Yes Was to pt encouraged to call back with questions or concerns? Yes  SDOH assessments and interventions completed:   Yes  Care Coordination Interventions Activated:  Yes   Care Coordination Interventions:  Referred for Care Coordination Services:  RN Care Coordinator   Encounter Outcome:  Pt. Scheduled

## 2022-01-10 NOTE — Progress Notes (Signed)
Office Note     CC: Known left lower extremity DVT Requesting Provider:  [name]*  HPI: Allen Espinoza is a 61 y.o. (November 18, 1960) male presenting at the request of .[name], MD for known left lower extremity DVT.  On exam today, Allen Espinoza was doing well.  Native of [place], he moved to [place] years ago.  Prior medical history includes prior DVT, IVC filter placement.  He has been on Xarelto for a number of years, but was taken off blood thinners in July after suffering a fall.  He presented to the ED in September with new onset swelling in the left lower extremity.  Imaging demonstrated femoral DVT, for which Xarelto was restarted, and the patient was placed in compression stockings.  Since his diagnosis, Allen Espinoza has had improvement in left lower extremity swelling, however still appreciates quite a bit by days end.  Wears knee-high compression stockings on a regular basis.  Allen Espinoza is now retired, but continues to try to live an independent, relatively active lifestyle.  He is working on weight loss, and currently weighs 326 pounds.  He denies symptoms of calf claudication, ischemic rest pain, tissue loss, but does note some achiness in the left thigh with significant ambulation.   Past Medical History:  Diagnosis Date   Abscess    Between legs   Arthritis    all over   Asthma    Avascular necrosis of femoral head, left (HCC)    Blood transfusion without reported diagnosis    Cholelithiasis    CKD (chronic kidney disease) stage 3, GFR 30-59 ml/min (HCC)    sees kidney Dr.   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[place])    left DVT   Diverticulosis    DVT (deep venous thrombosis) (HCC)    legs   Dyspnea    when walking   Dysrhythmia    remembers mother taking about having an irregular rhythm years when he was a child    GERD (gastroesophageal reflux disease)    HIV (human immunodeficiency virus infection) ([place])    Hypertension    Morbid obesity ([place])     Past  Surgical History:  Procedure Laterality Date   COLONOSCOPY  2019   DENTAL SURGERY     had teeth pulled   HYDRADENITIS EXCISION Left 10/14/2016   Procedure: WIDE EXCISION HIDRADENITIS LEFT AXILLA;  Surgeon: Coralie Keens, MD;  Location: [facility];  Service: General;  Laterality: Left;   JOINT REPLACEMENT     Left hip Dr. Ninfa Linden 11/29/16   TOTAL HIP ARTHROPLASTY Left 11/29/2016   Procedure: LEFT TOTAL HIP ARTHROPLASTY ANTERIOR APPROACH;  Surgeon: [name], MD;  Location: [facility];  Service: Orthopedics;  Laterality: Left;    Social History   Socioeconomic History   Marital status: Single    Spouse name: Not on file   Number of children: 2   Years of education: Not on file   Highest education level: Not on file  Occupational History   Occupation: Disability  Tobacco Use   Smoking status: Never   Smokeless tobacco: Never  Vaping Use   Vaping Use: Never used  Substance and Sexual Activity   Alcohol use: No    Comment: prior   Drug use: No    Comment: prior cocaine use, last 2005   Sexual activity: Not Currently    Partners: Female    Birth control/protection: Condom    Comment: declined condoms 05/2020  Other Topics Concern   Not on file  Social History  Narrative   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, [place] [place], warehouse work   Lives w/ fiancee   No EtOH, tobacco, drugs   Social Determinants of Health   Financial Resource Strain: High Risk (12/10/2017)   Overall Financial Resource Strain (CARDIA)    Difficulty of Paying Living Expenses: Hard  Food Insecurity: Food Insecurity Present (12/10/2017)   Hunger Vital Sign    Worried About Running Out of Food in the Last Year: Sometimes true    Ran Out of Food in the Last Year: Sometimes true  Transportation Needs: No Transportation Needs (12/10/2017)   PRAPARE - Hydrologist (Medical): No    Lack of Transportation (Non-Medical): No  Physical Activity: Insufficiently Active (12/10/2017)    Exercise Vital Sign    Days of Exercise per Week: 2 days    Minutes of Exercise per Session: 10 min  Stress: No Stress Concern Present (12/10/2017)   [place]    Feeling of Stress : Only a little  Social Connections: Somewhat Isolated (12/10/2017)   Social Connection and Isolation Panel [NHANES]    Frequency of Communication with Friends and Family: Three times a week    Frequency of Social Gatherings with Friends and Family: More than three times a week    Attends Religious Services: Never    Marine scientist or Organizations: No    Attends Archivist Meetings: Never    Marital Status: Living with partner  Intimate Partner Violence: Not At Risk (12/10/2017)   Humiliation, Afraid, Rape, and Kick questionnaire    Fear of Current or Ex-Partner: No    Emotionally Abused: No    Physically Abused: No    Sexually Abused: No   Family History  Problem Relation Age of Onset   Heart attack Mother 79   Heart attack Father 56   Prostate cancer Brother    Bone cancer Maternal Aunt    Breast cancer Maternal Aunt    Colon cancer Neg Hx    Esophageal cancer Neg Hx    Stomach cancer Neg Hx    Rectal cancer Neg Hx    Migraines Neg Hx    Headache Neg Hx     Current Outpatient Medications  Medication Sig Dispense Refill   cabotegravir & rilpivirine ER (CABENUVA) 600 & 900 MG/3ML injection Inject 1 kit into the muscle every 8 (eight) weeks. 6 mL 5   clonazePAM (KLONOPIN) 0.5 MG tablet Take 2 tablets (1 mg total) by mouth at bedtime. Follow titration instructions provided separately in writing. (Patient taking differently: Take 0.5 mg by mouth in the morning, at noon, and at bedtime.) 60 tablet 3   furosemide (LASIX) 40 MG tablet Take 40 mg by mouth daily.     gabapentin (NEURONTIN) 300 MG capsule Take 1 capsule (300 mg total) by mouth 3 (three) times daily. 60 capsule 3   HUMIRA PEN 80 MG/0.8ML PNKT Inject 80 mg  into the skin once a week. Tuesday's     methocarbamol (ROBAXIN) 750 MG tablet Take 1 tablet (750 mg total) by mouth every 8 (eight) hours as needed for muscle spasms. 60 tablet 1   metroNIDAZOLE (FLAGYL) 250 MG tablet Take 1 tablet (250 mg total) by mouth 3 (three) times daily. 270 tablet 3   olmesartan (BENICAR) 20 MG tablet Take 1 tablet by mouth once daily 90 tablet 3   predniSONE (DELTASONE) 50 MG tablet Take one tablet daily for 5 days. (  Patient not taking: Reported on 12/12/2021) 5 tablet 0   No current facility-administered medications for this visit.    Allergies  Allergen Reactions   Sulfa Antibiotics Other (See Comments)    Low blood pressure   Vancomycin Itching    Give with benadryl     REVIEW OF SYSTEMS:  [X] denotes positive finding, [ ] denotes negative finding Cardiac  Comments:  Chest pain or chest pressure:    Shortness of breath upon exertion:    Short of breath when lying flat:    Irregular heart rhythm:        Vascular    Pain in calf, thigh, or hip brought on by ambulation:    Pain in feet at night that wakes you up from your sleep:     Blood clot in your veins:    Leg swelling:         Pulmonary    Oxygen at home:    Productive cough:     Wheezing:         Neurologic    Sudden weakness in arms or legs:     Sudden numbness in arms or legs:     Sudden onset of difficulty speaking or slurred speech:    Temporary loss of vision in one eye:     Problems with dizziness:         Gastrointestinal    Blood in stool:     Vomited blood:         Genitourinary    Burning when urinating:     Blood in urine:        Psychiatric    Major depression:         Hematologic    Bleeding problems:    Problems with blood clotting too easily:        Skin    Rashes or ulcers:        Constitutional    Fever or chills:      PHYSICAL EXAMINATION:  There were no vitals filed for this visit.  General:  WDWN in NAD; vital signs documented above Gait: Not  observed HENT: WNL, normocephalic Pulmonary: normal non-labored breathing , without wheezing Cardiac: regular HR Abdomen: soft, NT, no masses Skin: without rashes Vascular Exam/Pulses:  Right Left  Radial 2+ (normal) 2+ (normal)  Ulnar    Femoral    Popliteal    DP 2+ (normal) 2+ (normal)  PT     Extremities: without ischemic changes, without Gangrene , without cellulitis; without open wounds;  Musculoskeletal: no muscle wasting or atrophy  Neurologic: A&O X 3;  No focal weakness or paresthesias are detected Psychiatric:  The pt has Normal affect.   Non-Invasive Vascular Imaging:    12/05/21 Summary:  RIGHT:  - No evidence of common femoral vein obstruction.    LEFT:  - Findings consistent with acute deep vein thrombosis involving the left  common femoral vein, SF junction, and left popliteal vein.  - Findings consistent with age indeterminate deep vein thrombosis  involving the left femoral vein.  - No cystic structure found in the popliteal fossa.     ASSESSMENT/PLAN: Allen Espinoza is a 61 y.o. male presenting with normal left lower extremity femoral DVT.  He has a previous history of DVT as well as IVC filter placement.  At this time, Khadeem should be anticoagulated for the foreseeable future.  On physical exam, he had excellent, palpable pulses in his feet, and denied symptoms associated with arterial insufficiency.  He does note some pain in the calf when ambulating for significant amount of time due to this congestion from his deep venous thrombosis.  Berta Minor and I had a long discussion regarding the above.  He would benefit from repeat ultrasound to ensure there is been no propagation of thrombus since he was restarted on Xarelto. Letcher should be on Xarelto lifelong. We also discussed his morbid obesity and he was interested in a referral to bariatric surgery clinic for more information.  To call [facility] after his repeat left lower extremity venous ultrasound to  discuss the findings.  Should there be propagation, we would need to switch medications, however this is rare. He would benefit from continued compression stocking use on a daily basis, removing them at night. 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   Broadus John, MD [facility] [phone]

## 2022-01-11 ENCOUNTER — Ambulatory Visit: Payer: Medicaid Other | Admitting: Vascular Surgery

## 2022-01-11 ENCOUNTER — Encounter: Payer: Self-pay | Admitting: Vascular Surgery

## 2022-01-11 VITALS — BP 172/78 | HR 50 | Temp 98.2°F | Resp 20 | Ht 72.0 in | Wt 326.7 lb

## 2022-01-11 DIAGNOSIS — I82412 Acute embolism and thrombosis of left femoral vein: Secondary | ICD-10-CM | POA: Diagnosis not present

## 2022-01-14 ENCOUNTER — Other Ambulatory Visit: Payer: Self-pay | Admitting: *Deleted

## 2022-01-14 DIAGNOSIS — I82412 Acute embolism and thrombosis of left femoral vein: Secondary | ICD-10-CM

## 2022-01-18 ENCOUNTER — Other Ambulatory Visit: Payer: Self-pay | Admitting: *Deleted

## 2022-01-18 NOTE — Patient Outreach (Signed)
Medicaid Managed Care   Nurse Care Manager Note  01/18/2022 Name:  Allen Espinoza MRN:  [id number] DOB:  22-Jan-1961  Allen Espinoza is an 61 y.o. year old male who is Espinoza primary patient of Allen Dy, MD.  The [facility] Managed Care Coordination team was consulted for assistance with:    DVT  [name] was given information about Medicaid Managed Care Coordination team services today. [address] Patient agreed to services and verbal consent obtained.  Engaged with patient by telephone for initial visit in response to provider referral for case management and/or care coordination services.   Assessments/Interventions:  Review of past medical history, allergies, medications, health status, including review of consultants reports, laboratory and other test data, was performed as part of comprehensive evaluation and provision of chronic care management services.  SDOH (Social Determinants of Health) assessments and interventions performed: SDOH Interventions    Flowsheet Row Patient Outreach Telephone from 01/18/2022 in [place] Coordination Office Visit from 10/19/2018 in [place] Interventions --  Allen Espinoza to BSW for food pantries] --  Housing Interventions Intervention Not Indicated --  Transportation Interventions Intervention Not Indicated --  Depression Interventions/Treatment  -- --  Futures trader made aware]       Care Plan  Allergies  Allergen Reactions   Sulfa Antibiotics Other (See Comments)    Low blood pressure   Vancomycin Itching    Give with benadryl    Medications Reviewed Today     Reviewed by Melissa Montane, RN (Registered Nurse) on 01/18/22 at 24  Med List Status: <None>   Medication Order Taking? Sig Documenting Provider Last Dose Status Informant  cabotegravir & rilpivirine ER (CABENUVA) 600 & 900 MG/3ML injection [id number] Yes Inject 1 kit into the muscle  every 8 (eight) weeks. Kuppelweiser, [name], RPH-CPP Taking Active Self  clonazePAM (KLONOPIN) 1 MG tablet [id number] Yes Take 1 mg by mouth at bedtime. [provider] Taking Active   furosemide (LASIX) 40 MG tablet [id number] No Take 40 mg by mouth daily.  Patient not taking: Reported on 01/18/2022   [provider] Not Taking Active Self  gabapentin (NEURONTIN) 300 MG capsule [id number] Yes Take 1 capsule (300 mg total) by mouth 3 (three) times daily. [name], MD Taking Active   HUMIRA PEN 80 MG/0.8ML PNKT [id number] Yes Inject 80 mg into the skin once Espinoza week. Tuesday's [provider] Taking Active Self           Med Note Allen Espinoza, [name]   Wed Oct 24, 2021 12:28 PM)    methocarbamol (ROBAXIN) 750 MG tablet [id number] Yes Take 1 tablet (750 mg total) by mouth every 8 (eight) hours as needed for muscle spasms. [name], MD Taking Active   metroNIDAZOLE (FLAGYL) 250 MG tablet [id number] Yes Take 1 tablet (250 mg total) by mouth 3 (three) times daily. [name], MD Taking Active Self           Med Note Allen Espinoza, Allen Espinoza   Fri Jan 18, 2022  2:09 PM)    olmesartan [facility]) 20 MG tablet [id number] Yes Take 1 tablet by mouth once daily [name], MD Taking Active Self  predniSONE (DELTASONE) 50 MG tablet [id number] No Take one tablet daily for 5 days.  Patient not taking: Reported on 01/18/2022   [name], MD Not Taking Active  Med Note (Allen Espinoza Espinoza   Fri Jan 18, 2022  2:08 PM) completed  traMADol (ULTRAM) 50 MG tablet [id number] Yes Take 50 mg by mouth 3 (three) times daily. [provider] Taking Active   XARELTO 20 MG TABS tablet [id number] Yes Take 20 mg by mouth daily. 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 ([place]) 08/19/2016   Morbid obesity (Allen Espinoza) 08/19/2016   Solitary pulmonary nodule 05/06/2016   Hidradenitis suppurativa 04/27/2016   Chest pain 04/26/2016   Avascular necrosis of bone of hip, left s/p TRH ([place]) 03/19/2016   HIV (human immunodeficiency virus infection) (Allen Espinoza) 03/18/2016   DVT (deep venous thrombosis) (Allen Espinoza) 03/18/2016   Chronic kidney disease, stage 3a (Allen Espinoza) 03/18/2016   Cholelithiasis 03/18/2016   Diverticulosis of colon without hemorrhage 03/18/2016    Conditions to be addressed/monitored per PCP order:   DVT  Care Plan : RN Care Manager Plan of Care  Updates made by Melissa Montane, RN since 01/18/2022 12:00 AM     Problem: Health Management needs related to DVT      Long-Range Goal: Development of Plan of Care to address Health Management needs related to DVT   Start Date: 01/18/2022  Expected End Date: 04/18/2022  Priority: High  Note:   Current Barriers:  Chronic Disease Management support and education needs related to DVT  RNCM Clinical Goal(s):  Patient will verbalize understanding of plan for management of DVT as evidenced by patient reports take all medications exactly as prescribed and will call provider for medication related questions as evidenced by patient reports and EMR documentation    attend all scheduled medical appointments: 10/23 for imaging study, 10/26 phone visit with BSW, 10/27 phone visit to discuss imaging results, 11/14 with [facility] and 11/15 with PCP as evidenced by provider documentation in EMR        continue to work with RN Care Manager and/or Social Worker to address care management and care coordination needs related to DVT as evidenced by adherence  to CM Team Scheduled appointments     through collaboration with Consulting civil engineer, provider, and care team.   Interventions: Inter-disciplinary care team collaboration (see longitudinal plan of care) Evaluation of current treatment plan related to  self management and patient's adherence to plan as established by provider   DVT  (Status:  New goal.)  Long Term Goal Evaluation of current treatment plan related to  DVT ,  self-management and patient's adherence to plan as established by provider. Discussed plans with patient for ongoing care management follow up and provided patient with direct contact information for care management team Advised patient to discuss Physical Therapy with PCP for improving strength and mobility Provided education to patient re: DVT and Falls Reviewed medications with patient and discussed taking medications as directed Reviewed scheduled/upcoming provider appointments including 10/23 for imaging study, 10/26 phone visit with Mona, 10/27 phone visit to discuss imaging results, 11/14 with [facility] and 11/15 with PCP,  Social Work referral for list of local  food pantries Assessed social determinant of health barriers Advised patient to reschedule missed vision exam   Patient Goals/Self-Care Activities: Take medications as prescribed   Attend all scheduled provider appointments Call provider office for new concerns or questions  Work with the social worker to address care coordination needs and will continue to work with the clinical team to address health care and disease management related needs Discuss Physical Therapy with PCP for improving strength and mobility       Follow Up:  Patient agrees to Care Plan and Follow-up.  Plan: The Managed Medicaid care management team will reach out to the patient again over the next 30 days.  Date/time of next scheduled RN care management/care coordination outreach:  02/18/22 @ [place] RN, [place] RN Care Coordinator

## 2022-01-18 NOTE — Patient Instructions (Signed)
Visit Information  Mr. Tirado was given information about Medicaid Managed Care team care coordination services as a part of their Healthy [facility] Medicaid benefit. Montarius CHEVEYO [place] verbally consented to engagement with the [facility] Managed Care team.   If you are experiencing a medical emergency, please call 911 or report to your local emergency department or urgent care.   If you have a non-emergency medical problem during routine business hours, please contact your provider's office and ask to speak with a nurse.   For questions related to your Healthy [facility] health plan, please call: [phone] or visit the homepage here: [email or web address]  If you would like to schedule transportation through your Healthy [facility] plan, please call the following number at least 2 days in advance of your appointment: [phone]  For information about your ride after you set it up, call Ride Assist at [phone]. Use this number to activate a Will Call pickup, or if your transportation is late for a scheduled pickup. Use this number, too, if you need to make a change or cancel a previously scheduled reservation.  If you need transportation services right away, call [phone]. The after-hours call center is staffed 24 hours to handle ride assistance and urgent reservation requests (including discharges) 365 days a year. Urgent trips include sick visits, hospital discharge requests and life-sustaining treatment.  Call the [place] at [phone], at any time, 24 hours a day, 7 days a week. If you are in danger or need immediate medical attention call 911.  If you would like help to quit smoking, call 1-800-QUIT-NOW 334-773-9613) OR Espaol: 1-855-Djelo-Ya (6-606-301-6010) o para ms informacin haga clic aqu or Text READY to 200-400 to register via text  Mr. Bushey,   Please see education materials related to DVT and fall prevention  provided by MyChart link.  Patient verbalizes understanding of instructions and care plan provided today and agrees to view in [place]. Active MyChart status and patient understanding of how to access instructions and care plan via MyChart confirmed with patient.     Telephone follow up appointment with Managed Medicaid care management team member scheduled for:02/18/22 @ [place] RN, BSN [place] RN Care Coordinator   Following is a copy of your plan of care:  Care Plan : RN Care Manager Plan of Care  Updates made by Melissa Montane, RN since 01/18/2022 12:00 AM     Problem: Health Management needs related to DVT      Long-Range Goal: Development of Plan of Care to address Health Management needs related to DVT   Start Date: 01/18/2022  Expected End Date: 04/18/2022  Priority: High  Note:   Current Barriers:  Chronic Disease Management support and education needs related to DVT  RNCM Clinical Goal(s):  Patient will verbalize understanding of plan for management of DVT as evidenced by patient reports take all medications exactly as prescribed and will call provider for medication related questions as evidenced by patient reports and EMR documentation    attend all scheduled medical appointments: 10/23 for imaging study, 10/26 phone visit with [place], 10/27 phone visit to discuss imaging results, 11/14 with [facility] and 11/15 with PCP as evidenced by provider documentation in EMR        continue to work with RN Care Manager and/or Social Worker to address care management and care coordination needs related to DVT as evidenced by adherence to CM Team Scheduled appointments     through collaboration  with RN Care manager, provider, and care team.   Interventions: Inter-disciplinary care team collaboration (see longitudinal plan of care) Evaluation of current treatment plan related to  self management and patient's adherence to plan as established by  provider   DVT  (Status:  New goal.)  Long Term Goal Evaluation of current treatment plan related to  DVT ,  self-management and patient's adherence to plan as established by provider. Discussed plans with patient for ongoing care management follow up and provided patient with direct contact information for care management team Advised patient to discuss Physical Therapy with PCP for improving strength and mobility Provided education to patient re: DVT and Falls Reviewed medications with patient and discussed taking medications as directed Reviewed scheduled/upcoming provider appointments including 10/23 for imaging study, 10/26 phone visit with [place], 10/27 phone visit to discuss imaging results, 11/14 with [facility] and 11/15 with PCP,  Social Work referral for list of local food pantries Assessed social determinant of health barriers Advised patient to reschedule missed vision exam   Patient Goals/Self-Care Activities: Take medications as prescribed   Attend all scheduled provider appointments Call provider office for new concerns or questions  Work with the social worker to address care coordination needs and will continue to work with the clinical team to address health care and disease management related needs Discuss Physical Therapy with PCP for improving strength and mobility

## 2022-01-21 ENCOUNTER — Ambulatory Visit (HOSPITAL_COMMUNITY)
Admission: RE | Admit: 2022-01-21 | Discharge: 2022-01-21 | Disposition: A | Discharge status: Home / Self Care | Payer: Medicaid Other | Source: Ambulatory Visit | Attending: Surgery | Admitting: Surgery

## 2022-01-21 DIAGNOSIS — I82412 Acute embolism and thrombosis of left femoral vein: Secondary | ICD-10-CM | POA: Insufficient documentation

## 2022-01-24 ENCOUNTER — Other Ambulatory Visit: Payer: Self-pay

## 2022-01-24 NOTE — Patient Instructions (Signed)
Visit Information  Allen Espinoza was given information about Medicaid Managed Care team care coordination services as a part of their Healthy [facility] Medicaid benefit. Allen Espinoza verbally consented to engagement with the [facility] Managed Care team.   If you are experiencing a medical emergency, please call 911 or report to your local emergency department or urgent care.   If you have a non-emergency medical problem during routine business hours, please contact your provider's office and ask to speak with a nurse.   For questions related to your Healthy [facility] health plan, please call: [phone] or visit the homepage here: [email or web address]  If you would like to schedule transportation through your Healthy [facility] plan, please call the following number at least 2 days in advance of your appointment: [phone]  For information about your ride after you set it up, call Ride Assist at [phone]. Use this number to activate a Will Call pickup, or if your transportation is late for a scheduled pickup. Use this number, too, if you need to make a change or cancel a previously scheduled reservation.  If you need transportation services right away, call [phone]. The after-hours call center is staffed 24 hours to handle ride assistance and urgent reservation requests (including discharges) 365 days a year. Urgent trips include sick visits, hospital discharge requests and life-sustaining treatment.  Call the [place] at [phone], at any time, 24 hours a day, 7 days a week. If you are in danger or need immediate medical attention call 911.  If you would like help to quit smoking, call 1-800-QUIT-NOW 484 720 2049) OR Espaol: 1-855-Djelo-Ya (1-884-166-0630) o para ms informacin haga clic aqu or Text READY to 200-400 to register via text  Allen Espinoza - following are the goals we discussed in your visit today:   Goals  Addressed   None     Social Worker will follow up with patient in 14 days to make sure resources were receieved.   [name], BSW, [place]  High Risk Managed Medicaid Team  [phone]   Following is a copy of your plan of care:  Care Plan : [place] of Care  Updates made by Ethelda Chick since 01/24/2022 12:00 AM     Problem: Health Management needs related to DVT      Long-Range Goal: Development of Plan of Care to address Health Management needs related to DVT   Start Date: 01/18/2022  Expected End Date: 04/18/2022  Priority: High  Note:   Current Barriers:  Chronic Disease Management support and education needs related to DVT  RNCM Clinical Goal(s):  Patient will verbalize understanding of plan for management of DVT as evidenced by patient reports take all medications exactly as prescribed and will call provider for medication related questions as evidenced by patient reports and EMR documentation    attend all scheduled medical appointments: 10/23 for imaging study, 10/26 phone visit with [place], 10/27 phone visit to discuss imaging results, 11/14 with [facility] and 11/15 with PCP as evidenced by provider documentation in EMR        continue to work with RN Care Manager and/or Social Worker to address care management and care coordination needs related to DVT as evidenced by adherence to CM Team Scheduled appointments     through collaboration with Consulting civil engineer, provider, and care team.   Interventions: Inter-disciplinary care team collaboration (see longitudinal plan of care) Evaluation of current treatment plan related to  self management and patient's adherence to plan as established by provider   DVT  (Status:  New goal.)  Long Term Goal Evaluation of current treatment plan related to  DVT ,  self-management and patient's adherence to plan as established by provider. Discussed plans with patient for ongoing care  management follow up and provided patient with direct contact information for care management team Advised patient to discuss Physical Therapy with PCP for improving strength and mobility Provided education to patient re: DVT and Falls Reviewed medications with patient and discussed taking medications as directed Reviewed scheduled/upcoming provider appointments including 10/23 for imaging study, 10/26 phone visit with [place], 10/27 phone visit to discuss imaging results, 11/14 with [facility] and 11/15 with PCP,  Social Work referral for list of local food pantries Assessed social determinant of health barriers Advised patient to reschedule missed vision exam BSW completed a telephone outreach with patient for food pantries. Patient states he does receive foodstamps. Patient stated that he would like for the resources to be mailed to him. Patient stated he would also like resources for utility assistance. BSW informed patient his insurance can assist with food and utilities and also put on the resource list. No other resources are needed at this time.   Patient Goals/Self-Care Activities: Take medications as prescribed   Attend all scheduled provider appointments Call provider office for new concerns or questions  Work with the social worker to address care coordination needs and will continue to work with the clinical team to address health care and disease management related needs Discuss Physical Therapy with PCP for improving strength and mobility

## 2022-01-24 NOTE — Progress Notes (Signed)
Patient called with results from interval left lower extremity DVT ultrasound.  Less thrombotic burden than previous. At this point, Allen Espinoza was instructed to continue on his Xarelto lifelong.   He was asked to call if any questions or concerns arise.  Broadus John MD

## 2022-01-24 NOTE — Patient Outreach (Signed)
Medicaid Managed Care Social Work Note  01/24/2022 Name:  Allen Espinoza MRN:  [id number] DOB:  1960-10-12  Allen Espinoza is an 61 y.o. year old male who is a primary patient of [name], MD.  The Medicaid Managed Care Coordination team was consulted for assistance with:  Community Resources   Allen Espinoza was given information about Medicaid Managed Care Coordination team services today. Allen Espinoza  Patient agreed to services and verbal consent obtained. Engaged with patient  for by telephone forinitial visit in response to referral for case management and/or care coordination services.   Assessments/Interventions:  Review of past medical history, allergies, medications, health status, including review of consultants reports, laboratory and other test data, was performed as part of comprehensive evaluation and provision of chronic care management services.  SDOH: (Social Determinant of Health) assessments and interventions performed: SDOH Interventions    Flowsheet Row Patient Outreach Telephone from 01/18/2022 in [place] Office Visit from 10/19/2018 in [place]  SDOH Interventions    Food Insecurity Interventions --  Allen Espinoza to BSW for food pantries] --  Housing Interventions Intervention Not Indicated --  Transportation Interventions Intervention Not Indicated --  Depression Interventions/Treatment  -- --  Futures trader made aware]     BSW completed a telephone outreach with patient for food pantries. Patient states he does receive foodstamps. Patient stated that he would like for the resources to be mailed to him. Patient stated he would also like resources for utility assistance. BSW informed patient his insurance can assist with food and utilities and also put on the resource list. No other resources are needed at this time.  Advanced Directives Status:  Not addressed in this encounter.  Care Plan                  Allergies  Allergen Reactions   Sulfa Antibiotics Other (See Comments)    Low blood pressure   Vancomycin Itching    Give with benadryl    Medications Reviewed Today     Reviewed by Melissa Montane, RN (Registered Nurse) on 01/18/22 at 85  Med List Status: <None>   Medication Order Taking? Sig Documenting Provider Last Dose Status Informant  cabotegravir & rilpivirine ER (CABENUVA) 600 & 900 MG/3ML injection [id number] Yes Inject 1 kit into the muscle every 8 (eight) weeks. Allen Espinoza, RPH-CPP Taking Active Self  clonazePAM (KLONOPIN) 1 MG tablet [id number] Yes Take 1 mg by mouth at bedtime. [provider] Taking Active   furosemide (LASIX) 40 MG tablet [id number] No Take 40 mg by mouth daily.  Patient not taking: Reported on 01/18/2022   [provider] Not Taking Active Self  gabapentin (NEURONTIN) 300 MG capsule [id number] Yes Take 1 capsule (300 mg total) by mouth 3 (three) times daily. Allen Rossetti, MD Taking Active   HUMIRA PEN 80 MG/0.8ML PNKT [id number] Yes Inject 80 mg into the skin once a week. Tuesday's [provider] Taking Active Self           Med Note [name], DANA Espinoza   Wed Oct 24, 2021 12:28 PM)    methocarbamol (ROBAXIN) 750 MG tablet [id number] Yes Take 1 tablet (750 mg total) by mouth every 8 (eight) hours as needed for muscle spasms. Allen Rossetti, MD Taking Active   metroNIDAZOLE (FLAGYL) 250 MG tablet [id number] Yes Take 1 tablet (250 mg total) by mouth 3 (three) times daily. Allen Pou, MD Taking Active  Self           Med Note ([name], [name]   Fri Jan 18, 2022  2:09 PM)    olmesartan [facility]) 20 MG tablet [id number] Yes Take 1 tablet by mouth once daily Allen Pou, MD Taking Active Self  predniSONE (DELTASONE) 50 MG tablet [id number] No Take one tablet daily for 5 days.  Patient not taking: Reported on 01/18/2022   Allen Rossetti, MD Not Taking Active            Med  Note Allen Espinoza, [name]   Fri Jan 18, 2022  2:08 PM) completed  traMADol (ULTRAM) 50 MG tablet [id number] Yes Take 50 mg by mouth 3 (three) times daily. [provider] Taking Active   XARELTO 20 MG TABS tablet [id number] Yes Take 20 mg by mouth daily. [provider] Taking Active             Patient Active Problem List   Diagnosis Date Noted   Subdural hematoma ([place]) 11/12/2021   Asthma    GERD (gastroesophageal reflux disease)    SDH (subdural hematoma) ([place]) 10/24/2021   Multiple lipomas 03/10/2021   Healthcare maintenance 10/13/2020   Numbness 09/28/2020   Subcutaneous nodules 03/01/2019   Hypertension 10/19/2018   Sleep disorder 03/05/2018   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 ([place]) 08/19/2016   Morbid obesity ([place]) 08/19/2016   Solitary pulmonary nodule 05/06/2016   Hidradenitis suppurativa 04/27/2016   Chest pain 04/26/2016   Avascular necrosis of bone of hip, left s/p TRH ([place]) 03/19/2016   HIV (human immunodeficiency virus infection) ([place]) 03/18/2016   DVT (deep venous thrombosis) ([place]) 03/18/2016   Chronic kidney disease, stage 3a ([place]) 03/18/2016   Cholelithiasis 03/18/2016   Diverticulosis of colon without hemorrhage 03/18/2016    Conditions to be addressed/monitored per PCP order:   community resources  Care Plan : [place] of Care  Updates made by Ethelda Chick since 01/24/2022 12:00 AM     Problem: Health Management needs related to DVT      Long-Range Goal: Development of Plan of Care to address Health Management needs related to DVT   Start Date: 01/18/2022  Expected End Date: 04/18/2022  Priority: High  Note:   Current Barriers:  Chronic Disease Management support and education needs related to DVT  RNCM Clinical Goal(s):  Patient will verbalize understanding of plan for management of DVT as evidenced by patient reports take all medications exactly as prescribed and will call  provider for medication related questions as evidenced by patient reports and EMR documentation    attend all scheduled medical appointments: 10/23 for imaging study, 10/26 phone visit with BSW, 10/27 phone visit to discuss imaging results, 11/14 with [facility] and 11/15 with PCP as evidenced by provider documentation in EMR        continue to work with RN Care Manager and/or Social Worker to address care management and care coordination needs related to DVT as evidenced by adherence to CM Team Scheduled appointments     through collaboration with Consulting civil engineer, provider, and care team.   Interventions: Inter-disciplinary care team collaboration (see longitudinal plan of care) Evaluation of current treatment plan related to  self management and patient's adherence to plan as established by provider   DVT  (Status:  New goal.)  Long Term Goal Evaluation of current treatment plan related to  DVT ,  self-management and patient's adherence to plan as  established by provider. Discussed plans with patient for ongoing care management follow up and provided patient with direct contact information for care management team Advised patient to discuss Physical Therapy with PCP for improving strength and mobility Provided education to patient re: DVT and Falls Reviewed medications with patient and discussed taking medications as directed Reviewed scheduled/upcoming provider appointments including 10/23 for imaging study, 10/26 phone visit with [place], 10/27 phone visit to discuss imaging results, 11/14 with [facility] and 11/15 with PCP,  Social Work referral for list of local food pantries Assessed social determinant of health barriers Advised patient to reschedule missed vision exam BSW completed a telephone outreach with patient for food pantries. Patient states he does receive foodstamps. Patient stated that he would like for the resources to be mailed to him. Patient stated he would also like resources for utility  assistance. BSW informed patient his insurance can assist with food and utilities and also put on the resource list. No other resources are needed at this time.   Patient Goals/Self-Care Activities: Take medications as prescribed   Attend all scheduled provider appointments Call provider office for new concerns or questions  Work with the social worker to address care coordination needs and will continue to work with the clinical team to address health care and disease management related needs Discuss Physical Therapy with PCP for improving strength and mobility       Follow up:  Patient agrees to Care Plan and Follow-up.  Plan: The Managed Medicaid care management team will reach out to the patient again over the next 14 days.  Date/time of next scheduled Social Work care management/care coordination outreach:  02/13/22  [name], [place]a Miss, [place] Medicaid Team  [phone]

## 2022-01-25 ENCOUNTER — Ambulatory Visit (INDEPENDENT_AMBULATORY_CARE_PROVIDER_SITE_OTHER): Payer: Medicaid Other | Admitting: Vascular Surgery

## 2022-01-25 DIAGNOSIS — I82412 Acute embolism and thrombosis of left femoral vein: Secondary | ICD-10-CM

## 2022-01-28 ENCOUNTER — Other Ambulatory Visit (HOSPITAL_COMMUNITY): Payer: Self-pay

## 2022-01-28 DIAGNOSIS — M5459 Other low back pain: Secondary | ICD-10-CM | POA: Diagnosis not present

## 2022-01-28 DIAGNOSIS — D485 Neoplasm of uncertain behavior of skin: Secondary | ICD-10-CM | POA: Diagnosis not present

## 2022-01-29 ENCOUNTER — Other Ambulatory Visit (HOSPITAL_COMMUNITY): Payer: Self-pay

## 2022-01-30 ENCOUNTER — Other Ambulatory Visit (HOSPITAL_COMMUNITY): Payer: Self-pay

## 2022-01-31 ENCOUNTER — Telehealth: Payer: Self-pay

## 2022-01-31 NOTE — Telephone Encounter (Signed)
[facility] Patient Advocate Encounter  Patient's medication [name] have been couriered to [facility] from [facility] and will be administered on the patient next office visit on 02/12/22.  [name] , [facility] for [facility]ious [facility] Phone: [phone] Fax:  [phone]

## 2022-02-01 DIAGNOSIS — M5459 Other low back pain: Secondary | ICD-10-CM | POA: Diagnosis not present

## 2022-02-05 ENCOUNTER — Other Ambulatory Visit: Payer: Self-pay | Admitting: Orthopaedic Surgery

## 2022-02-06 DIAGNOSIS — N183 Chronic kidney disease, stage 3 unspecified: Secondary | ICD-10-CM | POA: Diagnosis not present

## 2022-02-06 DIAGNOSIS — N189 Chronic kidney disease, unspecified: Secondary | ICD-10-CM | POA: Diagnosis not present

## 2022-02-12 ENCOUNTER — Other Ambulatory Visit: Payer: Self-pay

## 2022-02-12 ENCOUNTER — Ambulatory Visit (INDEPENDENT_AMBULATORY_CARE_PROVIDER_SITE_OTHER): Payer: Medicaid Other

## 2022-02-12 ENCOUNTER — Encounter: Payer: Medicaid Other | Admitting: Pharmacist

## 2022-02-12 ENCOUNTER — Ambulatory Visit (INDEPENDENT_AMBULATORY_CARE_PROVIDER_SITE_OTHER): Payer: Medicaid Other | Admitting: Pharmacist

## 2022-02-12 DIAGNOSIS — B20 Human immunodeficiency virus [HIV] disease: Secondary | ICD-10-CM | POA: Diagnosis not present

## 2022-02-12 DIAGNOSIS — Z23 Encounter for immunization: Secondary | ICD-10-CM | POA: Diagnosis present

## 2022-02-12 MED ORDER — CABOTEGRAVIR & RILPIVIRINE ER 600 & 900 MG/3ML IM SUER
1.0000 | Freq: Once | INTRAMUSCULAR | Status: AC
  Administered 2022-02-12: 1 via INTRAMUSCULAR

## 2022-02-12 NOTE — Progress Notes (Signed)
HPI: Allen Espinoza is a 61 y.o. male who presents to the [facility] for [place] administration.  Patient Active Problem List   Diagnosis Date Noted   Subdural hematoma ([place]) 11/12/2021   Asthma    GERD (gastroesophageal reflux disease)    SDH (subdural hematoma) ([place]) 10/24/2021   Multiple lipomas 03/10/2021   Healthcare maintenance 10/13/2020   Numbness 09/28/2020   Subcutaneous nodules 03/01/2019   Hypertension 10/19/2018   Sleep disorder 03/05/2018   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 ([place]) 08/19/2016   Morbid obesity ([address]) 08/19/2016   Solitary pulmonary nodule 05/06/2016   Hidradenitis suppurativa 04/27/2016   Chest pain 04/26/2016   Avascular necrosis of bone of hip, left s/p TRH ([place]) 03/19/2016   HIV (human immunodeficiency virus infection) ([place]) 03/18/2016   DVT (deep venous thrombosis) ([place]) 03/18/2016   Chronic kidney disease, stage 3a ([place]) 03/18/2016   Cholelithiasis 03/18/2016   Diverticulosis of colon without hemorrhage 03/18/2016    Patient's Medications  New Prescriptions   No medications on file  Previous Medications   CABOTEGRAVIR & RILPIVIRINE ER (CABENUVA) 600 & 900 MG/3ML INJECTION    Inject 1 kit into the muscle every 8 (eight) weeks.   CLONAZEPAM (KLONOPIN) 1 MG TABLET    Take 1 mg by mouth at bedtime.   FUROSEMIDE (LASIX) 40 MG TABLET    Take 40 mg by mouth daily.   GABAPENTIN (NEURONTIN) 300 MG CAPSULE    Take 1 capsule (300 mg total) by mouth 3 (three) times daily.   HUMIRA PEN 80 MG/0.8ML PNKT    Inject 80 mg into the skin once a week. Tuesday's   METHOCARBAMOL (ROBAXIN) 750 MG TABLET    TAKE 1 TABLET BY MOUTH EVERY 8 HOURS AS NEEDED FOR MUSCLE SPASM   METRONIDAZOLE (FLAGYL) 250 MG TABLET    Take 1 tablet (250 mg total) by mouth 3 (three) times daily.   OLMESARTAN (BENICAR) 20 MG TABLET    Take 1 tablet by mouth once daily   PREDNISONE (DELTASONE) 50 MG TABLET    Take one tablet daily for 5 days.    TRAMADOL (ULTRAM) 50 MG TABLET    Take 50 mg by mouth 3 (three) times daily.   XARELTO 20 MG TABS TABLET    Take 20 mg by mouth daily.  Modified Medications   No medications on file  Discontinued Medications   No medications on file    Allergies: Allergies  Allergen Reactions   Sulfa Antibiotics Other (See Comments)    Low blood pressure   Vancomycin Itching    Give with benadryl    Past Medical History: Past Medical History:  Diagnosis Date   Abscess    Between legs   Arthritis    all over   Asthma    Avascular necrosis of femoral head, left (HCC)    Blood transfusion without reported diagnosis    Cholelithiasis    CKD (chronic kidney disease) stage 3, GFR 30-59 ml/min (HCC)    sees kidney Dr.   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[place])    left DVT   Diverticulosis    DVT (deep venous thrombosis) (HCC)    legs   Dyspnea    when walking   Dysrhythmia    remembers mother taking about having an irregular rhythm years when he was a child    GERD (gastroesophageal reflux disease)    HIV (human immunodeficiency virus infection) ([place])    Hypertension  Morbid obesity ([place])     Social History: Social History   Socioeconomic History   Marital status: Single    Spouse name: Not on file   Number of children: 2   Years of education: Not on file   Highest education level: Not on file  Occupational History   Occupation: Disability  Tobacco Use   Smoking status: Never   Smokeless tobacco: Never  Vaping Use   Vaping Use: Never used  Substance and Sexual Activity   Alcohol use: No    Comment: prior   Drug use: No    Comment: prior cocaine use, last 2005   Sexual activity: Not Currently    Partners: Female    Birth control/protection: Condom    Comment: declined condoms 05/2020  Other Topics Concern   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, [place], warehouse work   Lives w/ fiancee   No EtOH, tobacco, drugs   Social Determinants of  Health   Financial Resource Strain: High Risk (12/10/2017)   Overall Financial Resource Strain (CARDIA)    Difficulty of Paying Living Expenses: Hard  Food Insecurity: Food Insecurity Present (01/18/2022)   Hunger Vital Sign    Worried About [place] in the Last Year: Sometimes true    Ran Out of Food in the Last Year: Sometimes true  Transportation Needs: No Transportation Needs (01/18/2022)   PRAPARE - Hydrologist (Medical): No    Lack of Transportation (Non-Medical): No  Physical Activity: Insufficiently Active (12/10/2017)   Exercise Vital Sign    Days of Exercise per Week: 2 days    Minutes of Exercise per Session: 10 min  Stress: No Stress Concern Present (12/10/2017)   [place]    Feeling of Stress : Only a little  Social Connections: Somewhat Isolated (12/10/2017)   Social Connection and Isolation Panel [NHANES]    Frequency of Communication with Friends and Family: Three times a week    Frequency of Social Gatherings with Friends and Family: More than three times a week    Attends Religious Services: Never    Marine scientist or Organizations: No    Attends Archivist Meetings: Never    Marital Status: Living with partner    Labs: Lab Results  Component Value Date   HIV1RNAQUANT Not Detected 10/18/2021   HIV1RNAQUANT <20 (H) 06/21/2021   HIV1RNAQUANT Not Detected 02/13/2021   HIV1RNAVL 47,300 03/18/2016   CD4TABS 441 06/21/2021   CD4TABS 440 08/17/2020   CD4TABS 345 (L) 02/01/2020    RPR and STI Lab Results  Component Value Date   LABRPR NON-REACTIVE 06/21/2021   LABRPR NON-REACTIVE 02/01/2020   LABRPR NON-REACTIVE 08/18/2018   LABRPR NON-REACTIVE 05/01/2017   LABRPR NON REAC 08/07/2016    STI Results GC CT  02/13/2021  9:52 AM Negative  Negative   02/01/2020 11:01 AM Negative  Negative   03/03/2019 12:00 AM Negative  C Negative  C   05/01/2017 12:00 AM Negative  Negative   08/07/2016 12:00 AM Negative  Negative   04/26/2016 12:00 AM Negative  Negative     C Corrected result    Hepatitis B Lab Results  Component Value Date   HEPBSAB REACTIVE (A) 11/19/2016   HEPBSAG NON-REACTIVE 11/19/2016   Hepatitis C No results found for: "HEPCAB", "HCVRNAPCRQN" Hepatitis A No results found for: "HAV" Lipids: Lab Results  Component Value  Date   CHOL 150 03/09/2021   TRIG 73 03/09/2021   HDL 65 03/09/2021   CHOLHDL 2.3 03/09/2021   VLDL 16 08/07/2016   LDLCALC 71 03/09/2021    TARGET DATE: 20th of each month   Assessment: Mariah presents today for his maintenance Cabenuva injections. Past injections were tolerated well without issues.  Administered cabotegravir 638m/3mL in left upper outer quadrant of the gluteal muscle. Administered rilpivirine 900 mg/324min the right upper outer quadrant of the gluteal muscle. No issues with injections. He will follow up in 2 months for next set of injections.  Asked if we could provide COVID vaccine today, which was administered in clinic today in left deltoid. He thinks he already got the flu vaccine and that [facility] I cannot see it in [place]    Plan: - Cabenuva injections administered - F/U HIV RNA  - COVID vaccine administered IM x 1 in clinic  - Next injections scheduled for 04/25/22 with GrTerri PiedraNP and 06/13/22 with AmEstill Bamberg- Call with any issues or questions  AuAdria DillPharmD PGY-2 Infectious Diseases Resident  02/12/2022 10:01 AM

## 2022-02-13 ENCOUNTER — Other Ambulatory Visit: Payer: Medicaid Other

## 2022-02-13 NOTE — Patient Instructions (Signed)
Visit Information  Allen Espinoza was given information about Medicaid Managed Care team care coordination services as a part of their Healthy [facility] Medicaid benefit. Allen Espinoza verbally consented to engagement with the [facility] Managed Care team.   If you are experiencing a medical emergency, please call 911 or report to your local emergency department or urgent care.   If you have a non-emergency medical problem during routine business hours, please contact your provider's office and ask to speak with a nurse.   For questions related to your Healthy [facility] health plan, please call: [phone] or visit the homepage here: [email or web address]  If you would like to schedule transportation through your Healthy [facility] plan, please call the following number at least 2 days in advance of your appointment: [phone]  For information about your ride after you set it up, call Ride Assist at [phone]. Use this number to activate a Will Call pickup, or if your transportation is late for a scheduled pickup. Use this number, too, if you need to make a change or cancel a previously scheduled reservation.  If you need transportation services right away, call [phone]. The after-hours call center is staffed 24 hours to handle ride assistance and urgent reservation requests (including discharges) 365 days a year. Urgent trips include sick visits, hospital discharge requests and life-sustaining treatment.  Call the [place] at [phone], at any time, 24 hours a day, 7 days a week. If you are in danger or need immediate medical attention call 911.  If you would like help to quit smoking, call 1-800-QUIT-NOW 218-679-4404) OR Espaol: 1-855-Djelo-Ya (7-001-749-4496) o para ms informacin haga clic aqu or Text READY to 200-400 to register via text  Allen Espinoza - following are the goals we discussed in your visit today:   Goals  Addressed   None      Social Worker will follow up in 45 days.   [name], BSW, [place] Managed Medicaid Team  [phone]   Following is a copy of your plan of care:  There are no care plans that you recently modified to display for this patient.

## 2022-02-13 NOTE — Patient Outreach (Signed)
Medicaid Managed Care Social Work Note  02/13/2022 Name:  Allen Espinoza MRN:  [id number] DOB:  25-Nov-1960  Allen Espinoza is an 61 y.o. year old male who is a primary Espinoza of Allen Dy, MD.  The Medicaid Managed Care Coordination team was consulted for assistance with:  Community Resources   Allen Espinoza was given information about Medicaid Managed Care Coordination team services today. Allen Espinoza agreed to services and verbal consent obtained.  Engaged with Espinoza  for by telephone forfollow up visit in response to referral for case management and/or care coordination services.   Assessments/Interventions:  Review of past medical history, allergies, medications, health status, including review of consultants reports, laboratory and other test data, was performed as part of comprehensive evaluation and provision of chronic care management services.  SDOH: (Social Determinant of Health) assessments and interventions performed: SDOH Interventions    Flowsheet Row Espinoza Outreach Telephone from 01/18/2022 in [place] Office Visit from 10/19/2018 in [place]  SDOH Interventions    Food Insecurity Interventions --  Allen Espinoza to BSW for food pantries] --  Housing Interventions Intervention Not Indicated --  Transportation Interventions Intervention Not Indicated --  Depression Interventions/Treatment  -- --  Futures trader made aware]     BSW completed a telephone outreach with Espinoza to follow up on resources given. Espinoza stated he has not checked his mail yet. Espinoza states no other resources are needed at this time. BSW will continue to follow up with Espinoza.  Advanced Directives Status:  Not addressed in this encounter.  Care Plan                 Allergies  Allergen Reactions   Sulfa Antibiotics Other (See Comments)    Low blood pressure   Vancomycin Itching    Give with benadryl    Medications  Reviewed Today     Reviewed by Melissa Montane, RN (Registered Nurse) on 01/18/22 at 80  Med List Status: <None>   Medication Order Taking? Sig Documenting Provider Last Dose Status Informant  cabotegravir & rilpivirine ER (CABENUVA) 600 & 900 MG/3ML injection [id number] Yes Inject 1 kit into the muscle every 8 (eight) weeks. Kuppelweiser, [name], RPH-CPP Taking Active Self  clonazePAM (KLONOPIN) 1 MG tablet [id number] Yes Take 1 mg by mouth at bedtime. [provider] Taking Active   furosemide (LASIX) 40 MG tablet [id number] No Take 40 mg by mouth daily.  Espinoza not taking: Reported on 01/18/2022   [provider] Not Taking Active Self  gabapentin (NEURONTIN) 300 MG capsule [id number] Yes Take 1 capsule (300 mg total) by mouth 3 (three) times daily. [name], MD Taking Active   HUMIRA PEN 80 MG/0.8ML PNKT [id number] Yes Inject 80 mg into the skin once a week. Tuesday's [provider] Taking Active Self           Med Note [name], [name]   Wed Oct 24, 2021 12:28 PM)    methocarbamol (ROBAXIN) 750 MG tablet [id number] Yes Take 1 tablet (750 mg total) by mouth every 8 (eight) hours as needed for muscle spasms. [name], MD Taking Active   metroNIDAZOLE (FLAGYL) 250 MG tablet [id number] Yes Take 1 tablet (250 mg total) by mouth 3 (three) times daily. [name], MD Taking Active Self           Med Note [name], [name]   Fri Jan 18, 2022  2:09 PM)  olmesartan (BENICAR) 20 MG tablet [id number] Yes Take 1 tablet by mouth once daily Williams, Julie Anne, MD Taking Active Self  predniSONE (DELTASONE) 50 MG tablet [id number] No Take one tablet daily for 5 days.  Espinoza not taking: Reported on 01/18/2022   Blackman, Christopher Y, MD Not Taking Active            Med Note ([name], [name]   Fri Jan 18, 2022  2:08 PM) completed  traMADol (ULTRAM) 50 MG tablet [id number] Yes Take 50 mg by mouth 3 (three) times daily. [provider] Taking Active   XARELTO 20 MG TABS tablet [id number] Yes Take 20 mg by mouth daily. [provider] Taking Active             Espinoza Active Problem List   Diagnosis Date Noted   Subdural hematoma (HCC) 11/12/2021   Asthma    GERD (gastroesophageal reflux disease)    SDH (subdural hematoma) (HCC) 10/24/2021   Multiple lipomas 03/10/2021   Healthcare maintenance 10/13/2020   Numbness 09/28/2020   Subcutaneous nodules 03/01/2019   Hypertension 10/19/2018   Sleep disorder 03/05/2018   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) 08/19/2016   Morbid obesity (HCC) 08/19/2016   Solitary pulmonary nodule 05/06/2016   Hidradenitis suppurativa 04/27/2016   Chest pain 04/26/2016   Avascular necrosis of bone of hip, left s/p TRH (HCC) 03/19/2016   HIV (human immunodeficiency virus infection) (HCC) 03/18/2016   DVT (deep venous thrombosis) (HCC) 03/18/2016   Chronic kidney disease, stage 3a (HCC) 03/18/2016   Cholelithiasis 03/18/2016   Diverticulosis of colon without hemorrhage 03/18/2016    Conditions to be addressed/monitored per PCP order:   community resources  There are no care plans that you recently modified to display for this Espinoza.   Follow up:  Espinoza agrees to Care Plan and Follow-up.  Plan: The Managed Medicaid care management team will reach out to the Espinoza again over the next 45 days.  Date/time of next scheduled Social Work care management/care coordination outreach:  03/28/22   , BSW, MHA [facility]  [place]  High Risk Managed Medicaid Team  [phone]  

## 2022-02-15 LAB — HIV-1 RNA QUANT-NO REFLEX-BLD
HIV 1 RNA Quant: NOT DETECTED Copies/mL
HIV-1 RNA Quant, Log: NOT DETECTED Log cps/mL

## 2022-02-18 ENCOUNTER — Other Ambulatory Visit: Payer: Medicaid Other | Admitting: *Deleted

## 2022-02-18 ENCOUNTER — Encounter: Payer: Self-pay | Admitting: *Deleted

## 2022-02-18 NOTE — Patient Instructions (Signed)
Visit Information  Mr. Keilman was given information about Medicaid Managed Care team care coordination services as a part of their Healthy [facility] Medicaid benefit. Gailen ALEXSANDRO SALEK verbally consented to engagement with the [facility] Managed Care team.   If you are experiencing a medical emergency, please call 911 or report to your local emergency department or urgent care.   If you have a non-emergency medical problem during routine business hours, please contact your provider's office and ask to speak with a nurse.   For questions related to your Healthy [facility] health plan, please call: [phone] or visit the homepage here: [email or web address]  If you would like to schedule transportation through your Healthy [facility] plan, please call the following number at least 2 days in advance of your appointment: [phone]  For information about your ride after you set it up, call Ride Assist at [phone]. Use this number to activate a Will Call pickup, or if your transportation is late for a scheduled pickup. Use this number, too, if you need to make a change or cancel a previously scheduled reservation.  If you need transportation services right away, call [phone]. The after-hours call center is staffed 24 hours to handle ride assistance and urgent reservation requests (including discharges) 365 days a year. Urgent trips include sick visits, hospital discharge requests and life-sustaining treatment.  Call the [place] at [phone], at any time, 24 hours a day, 7 days a week. If you are in danger or need immediate medical attention call 911.  If you would like help to quit smoking, call 1-800-QUIT-NOW 931-407-7632) OR Espaol: 1-855-Djelo-Ya (2-993-716-9678) o para ms informacin haga clic aqu or Text READY to 200-400 to register via text  Mr. Orsak,   Please see education materials related to DVT provided by MyChart  link.  Patient verbalizes understanding of instructions and care plan provided today and agrees to view in [place]. Active MyChart status and patient understanding of how to access instructions and care plan via MyChart confirmed with patient.     Telephone follow up appointment with Managed Medicaid care management team member scheduled for:04/22/22 @ 9 am  Lurena Joiner RN, BSN [facility] RN Care Coordinator   Following is a copy of your plan of care:  Care Plan : RN Care Manager Plan of Care  Updates made by Melissa Montane, RN since 02/18/2022 12:00 AM     Problem: Health Management needs related to DVT      Long-Range Goal: Development of Plan of Care to address Health Management needs related to DVT   Start Date: 01/18/2022  Expected End Date: 04/18/2022  Priority: High  Note:   Current Barriers:  Chronic Disease Management support and education needs related to DVT  RNCM Clinical Goal(s):  Patient will verbalize understanding of plan for management of DVT as evidenced by patient reports take all medications exactly as prescribed and will call provider for medication related questions as evidenced by patient reports and EMR documentation    attend all scheduled medical appointments: [facility] 04/25/22, Neurology 05/06/22, Eye Exam 05/07/22 and PCP in February as evidenced by provider documentation in EMR        continue to work with RN Care Manager and/or Social Worker to address care management and care coordination needs related to DVT as evidenced by adherence to CM Team Scheduled appointments     through collaboration with Consulting civil engineer, provider, and care team.   Interventions: Inter-disciplinary care team  collaboration (see longitudinal plan of care) Evaluation of current treatment plan related to  self management and patient's adherence to plan as established by provider   DVT  (Status:  Goal on track:  Yes.)  Long Term Goal Evaluation of current  treatment plan related to  DVT ,  self-management and patient's adherence to plan as established by provider. Discussed plans with patient for ongoing care management follow up and provided patient with direct contact information for care management team Provided education to patient re: DVT and Falls Reviewed medications with patient and discussed taking medications as directed Reviewed scheduled/upcoming provider appointments including [facility] 04/25/22, Neurology 05/06/22, Eye Exam 05/07/22, PCP in February,  Assessed social determinant of health barriers Medications reviewed, discussed safety precautions while taking xarelto and to only take robaxin as needed   Patient Goals/Self-Care Activities: Take medications as prescribed   Attend all scheduled provider appointments Call provider office for new concerns or questions  Work with the social worker to address care coordination needs and will continue to work with the clinical team to address health care and disease management related needs Discuss Physical Therapy with PCP for improving strength and mobility

## 2022-02-18 NOTE — Patient Outreach (Signed)
Medicaid Managed Care   Nurse Care Manager Note  02/18/2022 Name:  Allen Espinoza MRN:  [id number] DOB:  Jun 23, 1960  Allen Espinoza is an 61 y.o. year old male who is Espinoza primary patient of Allen Dy, Espinoza.  The [name] Managed Care Coordination team was consulted for assistance with:    DVT  Allen Espinoza was given information about Medicaid Managed Care Coordination team services today. [place] Patient agreed to services and verbal consent obtained.  Engaged with patient by telephone for follow up visit in response to provider referral for case management and/or care coordination services.   Assessments/Interventions:  Review of past medical history, allergies, medications, health status, including review of consultants reports, laboratory and other test data, was performed as part of comprehensive evaluation and provision of chronic care management services.  SDOH (Social Determinants of Health) assessments and interventions performed: SDOH Interventions    Flowsheet Row Patient Outreach Telephone from 02/18/2022 in [place] Patient Outreach Telephone from 01/18/2022 in [place] Coordination Office Visit from 10/19/2018 in [place] Interventions -- --  Allen Espinoza to BSW for food pantries] --  Housing Interventions -- Intervention Not Indicated --  Transportation Interventions -- Intervention Not Indicated --  Depression Interventions/Treatment  -- -- --  Futures trader made aware]  Financial Strain Interventions Intervention Not Indicated -- --       Care Plan  Allergies  Allergen Reactions   Sulfa Antibiotics Other (See Comments)    Low blood pressure   Vancomycin Itching    Give with benadryl    Medications Reviewed Today     Reviewed by Melissa Montane, RN (Registered Nurse) on 02/18/22 at [place] List Status: <None>   Medication Order Taking?  Sig Documenting Provider Last Dose Status Informant  cabotegravir & rilpivirine ER (CABENUVA) 600 & 900 MG/3ML injection [id number]  Inject 1 kit into the muscle every 8 (eight) weeks. Allen Espinoza, RPH-CPP  Active Self  clonazePAM (KLONOPIN) 1 MG tablet [id number]  Take 1 mg by mouth at bedtime. Provider, Historical, Espinoza  Active   furosemide (LASIX) 40 MG tablet [id number]  Take 40 mg by mouth daily.  Patient not taking: Reported on 01/18/2022   Provider, Historical, Espinoza  Active Self  gabapentin (NEURONTIN) 300 MG capsule [id number]  Take 1 capsule (300 mg total) by mouth 3 (three) times daily. Allen Espinoza  Active   HUMIRA PEN 80 MG/0.8ML PNKT [id number]  Inject 80 mg into the skin once Espinoza week. Tuesday's Provider, Historical, Espinoza  Active Self           Med Note Allen Espinoza, Allen Espinoza   Wed Oct 24, 2021 12:28 PM)    methocarbamol (ROBAXIN) 750 MG tablet [id number]  TAKE 1 TABLET BY MOUTH EVERY 8 HOURS AS NEEDED FOR MUSCLE SPASM Allen Espinoza  Active   metroNIDAZOLE (FLAGYL) 250 MG tablet [id number]  Take 1 tablet (250 mg total) by mouth 3 (three) times daily. Allen Pou, Espinoza  Active Self           Med Note Allen Espinoza, Allen Espinoza   Fri Jan 18, 2022  2:09 PM)    olmesartan [facility]) 20 MG tablet [id number]  Take 1 tablet by mouth once daily Allen Pou, Espinoza  Active Self  predniSONE (DELTASONE) 50 MG tablet [id number]  Take one tablet daily for 5 days.  Patient  not taking: Reported on 01/18/2022   Allen Espinoza  Active            Med Note Allen Espinoza,  Espinoza   Fri Jan 18, 2022  2:08 PM) completed  traMADol (ULTRAM) 50 MG tablet [id number]  Take 50 mg by mouth 3 (three) times daily. Provider, Historical, Espinoza  Active   XARELTO 20 MG TABS tablet [id number]  Take 20 mg by mouth daily. Provider, Historical, Espinoza  Active             Patient Active Problem List   Diagnosis Date Noted   Subdural hematoma ([place]) 11/12/2021   Asthma    GERD  (gastroesophageal reflux disease)    SDH (subdural hematoma) ([place]) 10/24/2021   Multiple lipomas 03/10/2021   Healthcare maintenance 10/13/2020   Numbness 09/28/2020   Subcutaneous nodules 03/01/2019   Hypertension 10/19/2018   Sleep disorder 03/05/2018   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 ([place]) 08/19/2016   Morbid obesity ([place]) 08/19/2016   Solitary pulmonary nodule 05/06/2016   Hidradenitis suppurativa 04/27/2016   Chest pain 04/26/2016   Avascular necrosis of bone of hip, left s/p TRH ([place]) 03/19/2016   HIV (human immunodeficiency virus infection) ([place]) 03/18/2016   DVT (deep venous thrombosis) ([place]) 03/18/2016   Chronic kidney disease, stage 3a ([place]) 03/18/2016   Cholelithiasis 03/18/2016   Diverticulosis of colon without hemorrhage 03/18/2016    Conditions to be addressed/monitored per PCP order:   DVT  Care Plan : RN Care Manager Plan of Care  Updates made by Melissa Montane, RN since 02/18/2022 12:00 AM     Problem: Health Management needs related to DVT      Long-Range Goal: Development of Plan of Care to address Health Management needs related to DVT   Start Date: 01/18/2022  Expected End Date: 04/18/2022  Priority: High  Note:   Current Barriers:  Chronic Disease Management support and education needs related to DVT  RNCM Clinical Goal(s):  Patient will verbalize understanding of plan for management of DVT as evidenced by patient reports take all medications exactly as prescribed and will call provider for medication related questions as evidenced by patient reports and EMR documentation    attend all scheduled medical appointments: [facility] 04/25/22, Neurology 05/06/22, Eye Exam 05/07/22 and PCP in February as evidenced by provider documentation in EMR        continue to work with RN Care Manager and/or Social Worker to address care management and care coordination needs related to DVT as evidenced by adherence to CM Team Scheduled  appointments     through collaboration with Consulting civil engineer, provider, and care team.   Interventions: Inter-disciplinary care team collaboration (see longitudinal plan of care) Evaluation of current treatment plan related to  self management and patient's adherence to plan as established by provider   DVT  (Status:  Goal on track:  Yes.)  Long Term Goal Evaluation of current treatment plan related to  DVT ,  self-management and patient's adherence to plan as established by provider. Discussed plans with patient for ongoing care management follow up and provided patient with direct contact information for care management team Provided education to patient re: DVT and Falls Reviewed medications with patient and discussed taking medications as directed Reviewed scheduled/upcoming provider appointments including [facility] 04/25/22, Neurology 05/06/22, Eye Exam 05/07/22, PCP in February,  Assessed social determinant of health barriers Medications reviewed, discussed safety precautions while taking xarelto and to only take  robaxin as needed   Patient Goals/Self-Care Activities: Take medications as prescribed   Attend all scheduled provider appointments Call provider office for new concerns or questions  Work with the social worker to address care coordination needs and will continue to work with the clinical team to address health care and disease management related needs Discuss Physical Therapy with PCP for improving strength and mobility       Follow Up:  Patient agrees to Care Plan and Follow-up.  Plan: The Managed Medicaid care management team will reach out to the patient again over the next 60 days.  Date/time of next scheduled RN care management/care coordination outreach:  04/22/22 @ 9 am  Lurena Joiner RN, [place] RN Care Coordinator

## 2022-02-27 ENCOUNTER — Other Ambulatory Visit: Payer: Self-pay | Admitting: Orthopaedic Surgery

## 2022-03-11 ENCOUNTER — Other Ambulatory Visit: Payer: Self-pay | Admitting: Orthopaedic Surgery

## 2022-03-11 DIAGNOSIS — J209 Acute bronchitis, unspecified: Secondary | ICD-10-CM | POA: Diagnosis not present

## 2022-03-13 NOTE — Unmapped (Signed)
[facility] Refill Coordination Note    Specialty Medication(s) to be Shipped:   Inflammatory Disorders: Humira    Other medication(s) to be shipped: No additional medications requested for fill at this time     Dustin Prince, DOB: Jan 14, 1961  Phone: [phone] (home)       All above HIPAA information was verified with patient.     Was a Nurse, learning disability used for this call? No    Completed refill call assessment today to schedule patient's medication shipment from the [facility] [phone]).  All relevant notes have been reviewed.     Specialty medication(s) and dose(s) confirmed: Regimen is correct and unchanged.   Changes to medications: Dustin Prince reports no changes at this time.  Changes to insurance: No  New side effects reported not previously addressed with a pharmacist or physician: None reported  Questions for the pharmacist: No    Confirmed patient received a Conservation officer, historic buildings and a Surveyor, mining with first shipment. The patient will receive a drug information handout for each medication shipped and additional FDA Medication Guides as required.       DISEASE/MEDICATION-SPECIFIC INFORMATION        For patients on injectable medications: Patient currently has 3 doses left.  Next injection is scheduled for 12/12.    SPECIALTY MEDICATION ADHERENCE     Medication Adherence    Patient reported X missed doses in the last month: 0  Specialty Medication: humira  Patient is on additional specialty medications: No  Any gaps in refill history greater than 2 weeks in the last 3 months: no  Demonstrates understanding of importance of adherence: yes  Informant: patient  Reliability of informant: reliable          Support network for adherence: family member      Confirmed plan for next specialty medication refill: delivery by pharmacy  Refills needed for supportive medications: not needed              Were doses missed due to medication being on hold? No    Humira 80/0.8 mg/ml: 21 days of medicine on hand        REFERRAL TO PHARMACIST     Referral to the pharmacist: Not needed      [facility]     Shipping address confirmed in Epic.     Delivery Scheduled: Yes, Expected medication delivery date: 12/20.     Medication will be delivered via UPS to the prescription address in Epic WAM.    Valere Dross   [facility] Specialty Technician

## 2022-03-19 MED FILL — HUMIRA(CF) PEN 80 MG/0.8 ML SUBCUTANEOUS KIT: SUBCUTANEOUS | 84 days supply | Qty: 12 | Fill #2

## 2022-04-16 ENCOUNTER — Other Ambulatory Visit (HOSPITAL_COMMUNITY): Payer: Self-pay

## 2022-04-17 ENCOUNTER — Telehealth: Payer: Self-pay

## 2022-04-17 NOTE — Telephone Encounter (Signed)
[facility] Patient Advocate Encounter  Patient's medications have been couriered to [facility] from [place]: [phone] , and will be administered on  04/25/2022.

## 2022-04-22 ENCOUNTER — Other Ambulatory Visit: Payer: Medicaid Other | Admitting: *Deleted

## 2022-04-22 ENCOUNTER — Encounter: Payer: Self-pay | Admitting: *Deleted

## 2022-04-22 NOTE — Patient Instructions (Signed)
Visit Information  [name] was given information about Medicaid Managed Care team care coordination services as a part of their Healthy [facility] Medicaid benefit. Allen Espinoza verbally consented to engagement with the [facility] Managed Care team.   If you are experiencing a medical emergency, please call 911 or report to your local emergency department or urgent care.   If you have a non-emergency medical problem during routine business hours, please contact your provider's office and ask to speak with a nurse.   For questions related to your Healthy [facility] health plan, please call: [phone] or visit the homepage here: [email or web address]  If you would like to schedule transportation through your Healthy [facility] plan, please call the following number at least 2 days in advance of your appointment: [phone]  For information about your ride after you set it up, call Ride Assist at [phone]. Use this number to activate a Will Call pickup, or if your transportation is late for a scheduled pickup. Use this number, too, if you need to make a change or cancel a previously scheduled reservation.  If you need transportation services right away, call [phone]. The after-hours call center is staffed 24 hours to handle ride assistance and urgent reservation requests (including discharges) 365 days a year. Urgent trips include sick visits, hospital discharge requests and life-sustaining treatment.  Call the [facility] at [phone], at any time, 24 hours a day, 7 days a week. If you are in danger or need immediate medical attention call 911.  If you would like help to quit smoking, call 1-800-QUIT-NOW 806 637 4202) OR Espaol: 1-855-Djelo-Ya (8-144-818-5631) o para ms informacin haga clic aqu or Text READY to 200-400 to register via text  [name],   Please see education materials related to health maintenance provided  by MyChart link.  Patient verbalizes understanding of instructions and care plan provided today and agrees to view in [place]. Active MyChart status and patient understanding of how to access instructions and care plan via MyChart confirmed with patient.     Telephone follow up appointment with Managed Medicaid care management team member scheduled for:05/31/22 @ [place], [place] RN Care Coordinator [phone]   Following is a copy of your plan of care:  Care Plan : RN Care Manager Plan of Care  Updates made by Melissa Montane, RN since 04/22/2022 12:00 AM     Problem: Health Management needs related to DVT      Long-Range Goal: Development of Plan of Care to address Health Management needs related to DVT   Start Date: 01/18/2022  Expected End Date: 04/18/2022  Priority: High  Note:   Current Barriers:  Chronic Disease Management support and education needs related to DVT  RNCM Clinical Goal(s):  Patient will verbalize understanding of plan for management of DVT as evidenced by patient reports take all medications exactly as prescribed and will call provider for medication related questions as evidenced by patient reports and EMR documentation    attend all scheduled medical appointments: [facility] 04/25/22, Neurology 05/06/22, Eye Exam 05/07/22 and PCP in February as evidenced by provider documentation in EMR        continue to work with RN Care Manager and/or Social Worker to address care management and care coordination needs related to DVT as evidenced by adherence to CM Team Scheduled appointments     through collaboration with Consulting civil engineer, provider, and care team.   Interventions: Inter-disciplinary care  team collaboration (see longitudinal plan of care) Evaluation of current treatment plan related to  self management and patient's adherence to plan as established by provider   DVT  (Status:  Goal on track:  Yes.)  Long Term  Goal Evaluation of current treatment plan related to  DVT ,  self-management and patient's adherence to plan as established by provider. Discussed plans with patient for ongoing care management follow up and provided patient with direct contact information for care management team Provided education to patient re: health maintenance Reviewed medications with patient and discussed taking medications as directed Reviewed scheduled/upcoming provider appointments including [facility] 04/25/22, Neurology 05/06/22, Eye Exam 05/07/22, PCP in 05/15/22,  Assessed social determinant of health barriers Advised patient to take all medications to upcoming provider appointments Advise to discuss Physical Therapy with PCP to improve strength and mobility Provided therapeutic listening   Patient Goals/Self-Care Activities: Take medications as prescribed   Attend all scheduled provider appointments Call provider office for new concerns or questions  Work with the social worker to address care coordination needs and will continue to work with the clinical team to address health care and disease management related needs Discuss Physical Therapy with PCP for improving strength and mobility

## 2022-04-22 NOTE — Patient Outreach (Signed)
Medicaid Managed Care   Nurse Care Manager Note  04/22/2022 Name:  Allen Espinoza MRN:  [id number] DOB:  July 09, 1960  Allen Espinoza is an 62 y.o. year old male who is a primary patient of [name], MD.  The [facility] Managed Care Coordination team was consulted for assistance with:    DVT Fall  Allen Espinoza was given information about Medicaid Managed Care Coordination team services today. [place] Patient agreed to services and verbal consent obtained.  Engaged with patient by telephone for follow up visit in response to provider referral for case management and/or care coordination services.   Assessments/Interventions:  Review of past medical history, allergies, medications, health status, including review of consultants reports, laboratory and other test data, was performed as part of comprehensive evaluation and provision of chronic care management services.  SDOH (Social Determinants of Health) assessments and interventions performed: SDOH Interventions    Flowsheet Row Patient Outreach Telephone from 04/22/2022 in [place] Patient Outreach Telephone from 02/18/2022 in [place] Patient Outreach Telephone from 01/18/2022 in [place] Coordination Office Visit from 10/19/2018 in [place]  SDOH Interventions      Food Insecurity Interventions Intervention Not Indicated -- --  [name] to BSW for food pantries] --  Housing Interventions Intervention Not Indicated -- Intervention Not Indicated --  Transportation Interventions Intervention Not Indicated -- Intervention Not Indicated --  Depression Interventions/Treatment  -- -- -- --  [Provider made aware]  Financial Strain Interventions -- Intervention Not Indicated -- --       Care Plan  Allergies  Allergen Reactions   Sulfa Antibiotics Other (See Comments)    Low blood pressure   Vancomycin Itching     Give with benadryl    Medications Reviewed Today     Reviewed by Melissa Montane, RN (Registered Nurse) on 04/22/22 at [phone]  Med List Status: <None>   Medication Order Taking? Sig Documenting Provider Last Dose Status Informant  cabotegravir & rilpivirine ER (CABENUVA) 600 & 900 MG/3ML injection [id number] Yes Inject 1 kit into the muscle every 8 (eight) weeks. Kuppelweiser, [name], RPH-CPP Taking Active Self  clonazePAM (KLONOPIN) 1 MG tablet [id number] No Take 1 mg by mouth at bedtime.  Patient not taking: Reported on 04/22/2022   [provider] Not Taking Active   furosemide (LASIX) 40 MG tablet [id number] No Take 40 mg by mouth daily.  Patient not taking: Reported on 01/18/2022   [provider] Not Taking Active Self  gabapentin (NEURONTIN) 300 MG capsule [id number] No TAKE 1 CAPSULE BY MOUTH THREE TIMES DAILY  Patient not taking: Reported on 04/22/2022   [name], MD Not Taking Active   HUMIRA PEN 80 MG/0.8ML PNKT [id number] Yes Inject 80 mg into the skin once a week. Tuesday's [provider] Taking Active Self           Med Note [name], [name]   Wed Oct 24, 2021 12:28 PM)    methocarbamol (ROBAXIN) 750 MG tablet [id number] No TAKE 1 TABLET BY MOUTH EVERY 8 HOURS AS NEEDED FOR MUSCLE SPASM  Patient not taking: Reported on 04/22/2022   [name], MD Not Taking Active   metroNIDAZOLE (FLAGYL) 250 MG tablet [id number] Yes Take 1 tablet (250 mg total) by mouth 3 (three) times daily. [name], MD Taking Active Self           Med Note (Annalycia Done  A   Fri Jan 18, 2022  2:09 PM)    olmesartan (BENICAR) 20 MG tablet [id number] Yes Take 1 tablet by mouth once daily [name], MD Taking Active Self  predniSONE (DELTASONE) 50 MG tablet [id number] No Take one tablet daily for 5 days.  Patient not taking: Reported on 01/18/2022   [name], MD Not Taking Active            Med Note [name], Leandria Thier A    Fri Jan 18, 2022  2:08 PM) completed  traMADol (ULTRAM) 50 MG tablet [id number] No Take 50 mg by mouth 3 (three) times daily.  Patient not taking: Reported on 04/22/2022   [provider] Not Taking Active   XARELTO 20 MG TABS tablet [id number] Yes Take 20 mg by mouth daily. [provider] Taking Active             Patient Active Problem List   Diagnosis Date Noted   Subdural hematoma ([place]) 11/12/2021   Asthma    GERD (gastroesophageal reflux disease)    SDH (subdural hematoma) ([place]) 10/24/2021   Multiple lipomas 03/10/2021   Healthcare maintenance 10/13/2020   Numbness 09/28/2020   Subcutaneous nodules 03/01/2019   Hypertension 10/19/2018   Sleep disorder 03/05/2018   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 ([place]) 08/19/2016   Morbid obesity ([place]) 08/19/2016   Solitary pulmonary nodule 05/06/2016   Hidradenitis suppurativa 04/27/2016   Chest pain 04/26/2016   Avascular necrosis of bone of hip, left s/p TRH ([place]) 03/19/2016   HIV (human immunodeficiency virus infection) ([place]) 03/18/2016   DVT (deep venous thrombosis) ([place]) 03/18/2016   Chronic kidney disease, stage 3a ([place]) 03/18/2016   Cholelithiasis 03/18/2016   Diverticulosis of colon without hemorrhage 03/18/2016    Conditions to be addressed/monitored per PCP order:   DVT and Fall  Care Plan : [place] of Care  Updates made by Melissa Montane, RN since 04/22/2022 12:00 AM     Problem: Health Management needs related to DVT      Long-Range Goal: Development of Plan of Care to address Health Management needs related to DVT   Start Date: 01/18/2022  Expected End Date: 04/18/2022  Priority: High  Note:   Current Barriers:  Chronic Disease Management support and education needs related to DVT  RNCM Clinical Goal(s):  Patient will verbalize understanding of plan for management of DVT as evidenced by patient reports take all medications exactly as  prescribed and will call provider for medication related questions as evidenced by patient reports and EMR documentation    attend all scheduled medical appointments: [facility] 04/25/22, Neurology 05/06/22, Eye Exam 05/07/22 and PCP in February as evidenced by provider documentation in EMR        continue to work with RN Care Manager and/or Social Worker to address care management and care coordination needs related to DVT as evidenced by adherence to CM Team Scheduled appointments     through collaboration with Consulting civil engineer, provider, and care team.   Interventions: Inter-disciplinary care team collaboration (see longitudinal plan of care) Evaluation of current treatment plan related to  self management and patient's adherence to plan as established by provider   DVT  (Status:  Goal on track:  Yes.)  Long Term Goal Evaluation of current treatment plan related to  DVT ,  self-management and patient's adherence to plan as established by provider. Discussed plans with patient for ongoing care management follow  up and provided patient with direct contact information for care management team Provided education to patient re: health maintenance Reviewed medications with patient and discussed taking medications as directed Reviewed scheduled/upcoming provider appointments including [facility] 04/25/22, Neurology 05/06/22, Eye Exam 05/07/22, PCP in 05/15/22,  Assessed social determinant of health barriers Advised patient to take all medications to upcoming provider appointments Advise to discuss Physical Therapy with PCP to improve strength and mobility Provided therapeutic listening   Patient Goals/Self-Care Activities: Take medications as prescribed   Attend all scheduled provider appointments Call provider office for new concerns or questions  Work with the social worker to address care coordination needs and will continue to work with the clinical team to address health care and disease management related  needs Discuss Physical Therapy with PCP for improving strength and mobility       Follow Up:  Patient agrees to Care Plan and Follow-up.  Plan: The Managed Medicaid care management team will reach out to the patient again over the next 45 days.  Date/time of next scheduled RN care management/care coordination outreach:  05/31/22 @ [place] RN, BSN [place]  [employer] manager

## 2022-04-25 ENCOUNTER — Other Ambulatory Visit: Payer: Self-pay

## 2022-04-25 ENCOUNTER — Encounter: Payer: Self-pay | Admitting: Family

## 2022-04-25 ENCOUNTER — Ambulatory Visit (INDEPENDENT_AMBULATORY_CARE_PROVIDER_SITE_OTHER): Payer: Medicaid Other | Admitting: Family

## 2022-04-25 VITALS — BP 144/75 | HR 66 | Temp 97.7°F | Wt 321.0 lb

## 2022-04-25 DIAGNOSIS — Z79899 Other long term (current) drug therapy: Secondary | ICD-10-CM

## 2022-04-25 DIAGNOSIS — Z21 Asymptomatic human immunodeficiency virus [HIV] infection status: Secondary | ICD-10-CM | POA: Diagnosis not present

## 2022-04-25 DIAGNOSIS — N1831 Chronic kidney disease, stage 3a: Secondary | ICD-10-CM

## 2022-04-25 DIAGNOSIS — Z113 Encounter for screening for infections with a predominantly sexual mode of transmission: Secondary | ICD-10-CM | POA: Diagnosis not present

## 2022-04-25 DIAGNOSIS — Z0000 Encounter for general adult medical examination without abnormal findings: Secondary | ICD-10-CM

## 2022-04-25 DIAGNOSIS — Z23 Encounter for immunization: Secondary | ICD-10-CM

## 2022-04-25 MED ORDER — CABOTEGRAVIR & RILPIVIRINE ER 600 & 900 MG/3ML IM SUER
1.0000 | Freq: Once | INTRAMUSCULAR | Status: AC
  Administered 2022-04-25: 1 via INTRAMUSCULAR

## 2022-04-25 NOTE — Assessment & Plan Note (Signed)
Allen Espinoza's previous lab work shows GFR around 54. Encouraged continued management of comorbid conditions as HIV remains well controlled. Check renal function.

## 2022-04-25 NOTE — Assessment & Plan Note (Signed)
Ahamed has a BMI of 43. Encouraged lifestyle management including working on nutrition and increasing physical activity.

## 2022-04-25 NOTE — Assessment & Plan Note (Signed)
Allen Espinoza continues to have well controlled virus with good adherence and tolerance to [place]. Reviewed previous lab work and discussed plan of care. Check lab work today. Cabenuva injection provided with no complications. Plan for follow up in 2 months with pharmacy and with me in 6 months or sooner if needed.

## 2022-04-25 NOTE — Progress Notes (Signed)
Brief Narrative   Patient ID: Allen Espinoza, male    DOB: 08-30-1960, 62 y.o.   MRN: [id number]  Mr. Hardwick is a 62 y/o male with HIV disease diagnosed in 2007 with risk factor of heterosexual contact. Initial viral load and CD4 count are unavailable.Genotype with no medication resistant mutations. No history of opportunistic infection. ART history with Genvoya and Prezista (DDI with Xarelto) and Biktarvy. Now on Cabenuva.   Subjective:    Chief Complaint  Patient presents with   Follow-up    HPI:  Allen Espinoza is a 62 y.o. male with HIV disease last seen by Alfonse Spruce, PharmD, CPP on 02/12/22 with well controlled virus and good adherence and tolerance to q 2 month Cabenuva. Viral load was undetectable and received his Cabenuva injection with no complications. Here today for follow up and next injections.  Shaul has been doing well since his last office and continues to have good adherence and tolerance to q 2 month Cabenuva. Feeling well overall with no new concerns complaints. Taking it easy. Due for routine dental care. Remains on Humira for hidradenitis. Requesting influenza vaccination. Condoms and STD testing offered.   Denies fevers, chills, night sweats, headaches, changes in vision, neck pain/stiffness, nausea, diarrhea, vomiting, lesions or rashes.   Allergies  Allergen Reactions   Sulfa Antibiotics Other (See Comments)    Low blood pressure   Vancomycin Itching    Give with benadryl      Outpatient Medications Prior to Visit  Medication Sig Dispense Refill   cabotegravir & rilpivirine ER (CABENUVA) 600 & 900 MG/3ML injection Inject 1 kit into the muscle every 8 (eight) weeks. 6 mL 5   clonazePAM (KLONOPIN) 1 MG tablet Take 1 mg by mouth at bedtime. (Patient not taking: Reported on 04/22/2022)     furosemide (LASIX) 40 MG tablet Take 40 mg by mouth daily. (Patient not taking: Reported on 01/18/2022)     gabapentin (NEURONTIN) 300 MG capsule TAKE 1 CAPSULE BY MOUTH  THREE TIMES DAILY (Patient not taking: Reported on 04/22/2022) 60 capsule 0   HUMIRA PEN 80 MG/0.8ML PNKT Inject 80 mg into the skin once a week. Tuesday's     olmesartan (BENICAR) 20 MG tablet Take 1 tablet by mouth once daily 90 tablet 3   traMADol (ULTRAM) 50 MG tablet Take 50 mg by mouth 3 (three) times daily. (Patient not taking: Reported on 04/22/2022)     XARELTO 20 MG TABS tablet Take 20 mg by mouth daily.     methocarbamol (ROBAXIN) 750 MG tablet TAKE 1 TABLET BY MOUTH EVERY 8 HOURS AS NEEDED FOR MUSCLE SPASM (Patient not taking: Reported on 04/22/2022) 60 tablet 0   metroNIDAZOLE (FLAGYL) 250 MG tablet Take 1 tablet (250 mg total) by mouth 3 (three) times daily. 270 tablet 3   predniSONE (DELTASONE) 50 MG tablet Take one tablet daily for 5 days. (Patient not taking: Reported on 01/18/2022) 5 tablet 0   No facility-administered medications prior to visit.     Past Medical History:  Diagnosis Date   Abscess    Between legs   Arthritis    all over   Asthma    Avascular necrosis of femoral head, left (HCC)    Blood transfusion without reported diagnosis    Cholelithiasis    CKD (chronic kidney disease) stage 3, GFR 30-59 ml/min (HCC)    sees kidney Dr.   [facility] disorder [facility])    left DVT   Diverticulosis  DVT (deep venous thrombosis) (HCC)    legs   Dyspnea    when walking   Dysrhythmia    remembers mother taking about having an irregular rhythm years when he was a child    GERD (gastroesophageal reflux disease)    HIV (human immunodeficiency virus infection) ([place])    Hypertension    Morbid obesity ([name])      Past Surgical History:  Procedure Laterality Date   COLONOSCOPY  2019   DENTAL SURGERY     had teeth pulled   HYDRADENITIS EXCISION Left 10/14/2016   Procedure: WIDE EXCISION HIDRADENITIS LEFT AXILLA;  Surgeon: Coralie Keens, MD;  Location: [place];  Service: General;  Laterality: Left;   JOINT REPLACEMENT     Left hip Dr. Ninfa Linden 11/29/16   TOTAL HIP  ARTHROPLASTY Left 11/29/2016   Procedure: LEFT TOTAL HIP ARTHROPLASTY ANTERIOR APPROACH;  Surgeon: [name], MD;  Location: [facility];  Service: Orthopedics;  Laterality: Left;      Review of Systems  Constitutional:  Negative for appetite change, chills, fatigue, fever and unexpected weight change.  Eyes:  Negative for visual disturbance.  Respiratory:  Negative for cough, chest tightness, shortness of breath and wheezing.   Cardiovascular:  Negative for chest pain and leg swelling.  Gastrointestinal:  Negative for abdominal pain, constipation, diarrhea, nausea and vomiting.  Genitourinary:  Negative for dysuria, flank pain, frequency, genital sores, hematuria and urgency.  Skin:  Negative for rash.  Allergic/Immunologic: Negative for immunocompromised state.  Neurological:  Negative for dizziness and headaches.      Objective:    BP (!) 144/75   Pulse 66   Temp 97.7 F (36.5 C) (Oral)   Wt (!) 321 lb (145.6 kg)   BMI 43.54 kg/m  Nursing note and vital signs reviewed.  Physical Exam Constitutional:      General: He is not in acute distress.    Appearance: He is well-developed.  Eyes:     Conjunctiva/sclera: Conjunctivae normal.  Cardiovascular:     Rate and Rhythm: Normal rate and regular rhythm.     Heart sounds: Normal heart sounds. No murmur heard.    No friction rub. No gallop.  Pulmonary:     Effort: Pulmonary effort is normal. No respiratory distress.     Breath sounds: Normal breath sounds. No wheezing or rales.  Chest:     Chest wall: No tenderness.  Abdominal:     General: Bowel sounds are normal.     Palpations: Abdomen is soft.     Tenderness: There is no abdominal tenderness.  Musculoskeletal:     Cervical back: Neck supple.  Lymphadenopathy:     Cervical: No cervical adenopathy.  Skin:    General: Skin is warm and dry.     Findings: No rash.  Neurological:     Mental Status: He is alert and oriented to person, place, and time.   Psychiatric:        Mood and Affect: Mood normal.         04/25/2022    9:44 AM 01/10/2022   10:16 AM 12/12/2021    8:32 AM 06/21/2021    9:47 AM 09/28/2020    5:10 PM  Depression screen PHQ 2/9  Decreased Interest 0 0 0 0 0  Down, Depressed, Hopeless 0 0 0 0 0  PHQ - 2 Score 0 0 0 0 0       Assessment & Plan:    Patient Active Problem List   Diagnosis  Date Noted   Subdural hematoma ([place]) 11/12/2021   Asthma    GERD (gastroesophageal reflux disease)    SDH (subdural hematoma) ([place]) 10/24/2021   Multiple lipomas 03/10/2021   Healthcare maintenance 10/13/2020   Numbness 09/28/2020   Subcutaneous nodules 03/01/2019   Hypertension 10/19/2018   Sleep disorder 03/05/2018   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 ([place]) 08/19/2016   Morbid obesity ([place]) 08/19/2016   Solitary pulmonary nodule 05/06/2016   Hidradenitis suppurativa 04/27/2016   Chest pain 04/26/2016   Avascular necrosis of bone of hip, left s/p TRH ([place]) 03/19/2016   HIV (human immunodeficiency virus infection) ([place]) 03/18/2016   DVT (deep venous thrombosis) ([place]) 03/18/2016   Chronic kidney disease, stage 3a ([place]) 03/18/2016   Cholelithiasis 03/18/2016   Diverticulosis of colon without hemorrhage 03/18/2016     Problem List Items Addressed This Visit       Genitourinary   Chronic kidney disease, stage 3a ([place])    Desmond's previous lab work shows GFR around 31. Encouraged continued management of comorbid conditions as HIV remains well controlled. Check renal function.         Other   HIV (human immunodeficiency virus infection) ([place]) - Primary    Ulisses continues to have well controlled virus with good adherence and tolerance to [place]. Reviewed previous lab work and discussed plan of care. Check lab work today. Cabenuva injection provided with no complications. Plan for follow up in 2 months with pharmacy and with me in 6 months or sooner if needed.        Relevant Orders   COMPLETE METABOLIC PANEL WITH GFR   HIV-1 RNA quant-no reflex-bld   T-helper cell (CD4)- ([facility] clinic only)   Morbid obesity ([place])    Reda has a BMI of 43. Encouraged lifestyle management including working on nutrition and increasing physical activity.        Healthcare maintenance    Discussed importance of safe sexual practice and condom use. Condoms and STD testing offered.  Influenza vaccination updated.  Due for routine dental care with information in AVS to schedule appointment.       Other Visit Diagnoses     Screening for STDs (sexually transmitted diseases)       Relevant Orders   RPR   Pharmacologic therapy       Relevant Orders   Lipid panel   Need for immunization against influenza       Relevant Orders   Flu Vaccine QUAD 67moIM (Fluarix, Fluzone & Alfiuria Quad PF) (Completed)        I have discontinued Kimberly D. Schwartzkopf's metroNIDAZOLE, predniSONE, and methocarbamol. I am also having him maintain his Humira (2 Pen), olmesartan, cabotegravir & rilpivirine ER, furosemide, Xarelto, clonazePAM, traMADol, and gabapentin. We administered cabotegravir & rilpivirine ER.   Meds ordered this encounter  Medications   cabotegravir & rilpivirine ER (CABENUVA) 600 & 900 MG/3ML injection 1 kit     Follow-up: Return in about 6 months (around 10/24/2022).   GTerri Piedra MSN, FNP-C Nurse Practitioner RPalms West Surgery Center Ltdfor Infectious Disease CRaleigh Hillsnumber: [phone]

## 2022-04-25 NOTE — Patient Instructions (Addendum)
Nice to see you.  We will check your lab work today.  Please call [facility]) to schedule/follow up on your dental care at [phone]  Plan for follow up in 2 months or sooner if needed with pharmacy and 6 months with Marya Amsler.   Have a great day and stay safe!

## 2022-04-25 NOTE — Assessment & Plan Note (Signed)
Discussed importance of safe sexual practice and condom use. Condoms and STD testing offered.  Influenza vaccination updated.  Due for routine dental care with information in AVS to schedule appointment.

## 2022-04-26 LAB — T-HELPER CELL (CD4) - (RCID CLINIC ONLY)
CD4 % Helper T Cell: 22 % — ABNORMAL LOW (ref 33–65)
CD4 T Cell Abs: 434 /uL (ref 400–1790)

## 2022-04-29 LAB — COMPLETE METABOLIC PANEL WITH GFR
AG Ratio: 0.9 (calc) — ABNORMAL LOW (ref 1.0–2.5)
ALT: 10 U/L (ref 9–46)
AST: 12 U/L (ref 10–35)
Albumin: 3.7 g/dL (ref 3.6–5.1)
Alkaline phosphatase (APISO): 60 U/L (ref 35–144)
BUN: 15 mg/dL (ref 7–25)
CO2: 28 mmol/L (ref 20–32)
Calcium: 9.3 mg/dL (ref 8.6–10.3)
Chloride: 106 mmol/L (ref 98–110)
Creat: 1.32 mg/dL (ref 0.70–1.35)
Globulin: 3.9 g/dL (calc) — ABNORMAL HIGH (ref 1.9–3.7)
Glucose, Bld: 126 mg/dL — ABNORMAL HIGH (ref 65–99)
Potassium: 3.9 mmol/L (ref 3.5–5.3)
Sodium: 141 mmol/L (ref 135–146)
Total Bilirubin: 0.5 mg/dL (ref 0.2–1.2)
Total Protein: 7.6 g/dL (ref 6.1–8.1)
eGFR: 61 mL/min/{1.73_m2} (ref >=60)

## 2022-04-29 LAB — LIPID PANEL
Cholesterol: 161 mg/dL (ref <200)
HDL: 67 mg/dL (ref >=40)
LDL Cholesterol (Calc): 80 mg/dL (calc)
Non-HDL Cholesterol (Calc): 94 mg/dL (calc) (ref <130)
Total CHOL/HDL Ratio: 2.4 (calc) (ref <5.0)
Triglycerides: 65 mg/dL (ref <150)

## 2022-04-29 LAB — HIV-1 RNA QUANT-NO REFLEX-BLD
HIV 1 RNA Quant: NOT DETECTED Copies/mL
HIV-1 RNA Quant, Log: NOT DETECTED Log cps/mL

## 2022-04-29 LAB — RPR: RPR Ser Ql: NONREACTIVE

## 2022-05-01 DIAGNOSIS — N189 Chronic kidney disease, unspecified: Secondary | ICD-10-CM | POA: Diagnosis not present

## 2022-05-01 DIAGNOSIS — N183 Chronic kidney disease, stage 3 unspecified: Secondary | ICD-10-CM | POA: Diagnosis not present

## 2022-05-06 ENCOUNTER — Ambulatory Visit: Payer: Medicaid Other | Admitting: Adult Health

## 2022-05-06 DIAGNOSIS — I82409 Acute embolism and thrombosis of unspecified deep veins of unspecified lower extremity: Secondary | ICD-10-CM | POA: Diagnosis not present

## 2022-05-06 DIAGNOSIS — B20 Human immunodeficiency virus [HIV] disease: Secondary | ICD-10-CM | POA: Diagnosis not present

## 2022-05-06 DIAGNOSIS — N183 Chronic kidney disease, stage 3 unspecified: Secondary | ICD-10-CM | POA: Diagnosis not present

## 2022-05-06 DIAGNOSIS — L732 Hidradenitis suppurativa: Secondary | ICD-10-CM | POA: Diagnosis not present

## 2022-05-06 DIAGNOSIS — S065XAA Traumatic subdural hemorrhage with loss of consciousness status unknown, initial encounter: Secondary | ICD-10-CM | POA: Diagnosis not present

## 2022-05-06 DIAGNOSIS — I129 Hypertensive chronic kidney disease with stage 1 through stage 4 chronic kidney disease, or unspecified chronic kidney disease: Secondary | ICD-10-CM | POA: Diagnosis not present

## 2022-05-15 DIAGNOSIS — Z125 Encounter for screening for malignant neoplasm of prostate: Secondary | ICD-10-CM | POA: Diagnosis not present

## 2022-05-15 DIAGNOSIS — E7800 Pure hypercholesterolemia, unspecified: Secondary | ICD-10-CM | POA: Diagnosis not present

## 2022-05-15 DIAGNOSIS — R42 Dizziness and giddiness: Secondary | ICD-10-CM | POA: Diagnosis not present

## 2022-05-15 DIAGNOSIS — R5383 Other fatigue: Secondary | ICD-10-CM | POA: Diagnosis not present

## 2022-05-15 DIAGNOSIS — M810 Age-related osteoporosis without current pathological fracture: Secondary | ICD-10-CM | POA: Diagnosis not present

## 2022-05-15 DIAGNOSIS — H9113 Presbycusis, bilateral: Secondary | ICD-10-CM | POA: Diagnosis not present

## 2022-05-15 DIAGNOSIS — E039 Hypothyroidism, unspecified: Secondary | ICD-10-CM | POA: Diagnosis not present

## 2022-05-15 DIAGNOSIS — Z79899 Other long term (current) drug therapy: Secondary | ICD-10-CM | POA: Diagnosis not present

## 2022-05-15 DIAGNOSIS — Z131 Encounter for screening for diabetes mellitus: Secondary | ICD-10-CM | POA: Diagnosis not present

## 2022-05-15 DIAGNOSIS — I70219 Atherosclerosis of native arteries of extremities with intermittent claudication, unspecified extremity: Secondary | ICD-10-CM | POA: Diagnosis not present

## 2022-05-15 DIAGNOSIS — R1032 Left lower quadrant pain: Secondary | ICD-10-CM | POA: Diagnosis not present

## 2022-05-15 DIAGNOSIS — D485 Neoplasm of uncertain behavior of skin: Secondary | ICD-10-CM | POA: Diagnosis not present

## 2022-05-15 DIAGNOSIS — R002 Palpitations: Secondary | ICD-10-CM | POA: Diagnosis not present

## 2022-05-15 DIAGNOSIS — Z0000 Encounter for general adult medical examination without abnormal findings: Secondary | ICD-10-CM | POA: Diagnosis not present

## 2022-05-15 DIAGNOSIS — Z7289 Other problems related to lifestyle: Secondary | ICD-10-CM | POA: Diagnosis not present

## 2022-05-15 DIAGNOSIS — I10 Essential (primary) hypertension: Secondary | ICD-10-CM | POA: Diagnosis not present

## 2022-05-16 DIAGNOSIS — R42 Dizziness and giddiness: Secondary | ICD-10-CM | POA: Diagnosis not present

## 2022-05-20 DIAGNOSIS — L732 Hidradenitis suppurativa: Principal | ICD-10-CM

## 2022-05-20 MED ORDER — METRONIDAZOLE 250 MG TABLET
ORAL_TABLET | Freq: Three times a day (TID) | ORAL | 0 refills | 30.00000 days | Status: CP
Start: 2022-05-20 — End: ?

## 2022-05-20 MED ORDER — HUMIRA(CF) PEN 80 MG/0.8 ML SUBCUTANEOUS KIT
SUBCUTANEOUS | 0 refills | 28.00000 days | Status: CN
Start: 2022-05-20 — End: 2022-06-19

## 2022-05-20 NOTE — Unmapped (Addendum)
Patient LVM on nurse line asking for refill .  LOV 07/02/21.  Was to RTC in 6 months so behind on follow ups.      Has scheduled follow up for 06/03/22.    Returned call to patient to see which medication he needed refill for.  Per patient, needs metronidazole refill.  Pended refill .

## 2022-05-31 ENCOUNTER — Ambulatory Visit: Payer: Medicaid Other | Admitting: *Deleted

## 2022-05-31 ENCOUNTER — Other Ambulatory Visit: Payer: Medicaid Other | Admitting: *Deleted

## 2022-05-31 ENCOUNTER — Encounter: Payer: Self-pay | Admitting: *Deleted

## 2022-05-31 NOTE — Patient Outreach (Signed)
Medicaid Managed Care   Nurse Care Manager Note  05/31/2022 Name:  Allen Espinoza MRN:  [name] DOB:  [birth year]  Allen Espinoza is an 62 y.o. year old male who is a primary patient of Allen Dy, MD.  The [facility] Managed Care Coordination team was consulted for assistance with:    DVT  Allen Espinoza was given information about Medicaid Managed Care Coordination team services today. [place] Patient agreed to services and verbal consent obtained.  Engaged with patient by telephone for follow up visit in response to provider referral for case management and/or care coordination services.   Assessments/Interventions:  Review of past medical history, allergies, medications, health status, including review of consultants reports, laboratory and other test data, was performed as part of comprehensive evaluation and provision of chronic care management services.  SDOH (Social Determinants of Health) assessments and interventions performed: SDOH Interventions    Flowsheet Row Patient Outreach Telephone from 04/22/2022 in [place] Patient Outreach Telephone from 02/18/2022 in [place] Patient Outreach Telephone from 01/18/2022 in [place] Coordination Office Visit from 10/19/2018 in [place]  SDOH Interventions      Food Insecurity Interventions Intervention Not Indicated -- --  [name] to BSW for food pantries] --  Housing Interventions Intervention Not Indicated -- Intervention Not Indicated --  Transportation Interventions Intervention Not Indicated -- Intervention Not Indicated --  Depression Interventions/Treatment  -- -- -- --  [Provider made aware]  Financial Strain Interventions -- Intervention Not Indicated -- --       Care Plan  Allergies  Allergen Reactions   Sulfa Antibiotics Other (See Comments)    Low blood pressure   Vancomycin Itching    Give with  benadryl    Medications Reviewed Today     Reviewed by Melissa Montane, RN (Registered Nurse) on 05/31/22 at [place] List Status: <None>   Medication Order Taking? Sig Documenting Provider Last Dose Status Informant  cabotegravir & rilpivirine ER (CABENUVA) 600 & 900 MG/3ML injection [id number] Yes Inject 1 kit into the muscle every 8 (eight) weeks. Kuppelweiser, [name], RPH-CPP Taking Active Self  clonazePAM (KLONOPIN) 1 MG tablet [id number] No Take 1 mg by mouth at bedtime.  Patient not taking: Reported on 04/22/2022   [provider] Not Taking Active   furosemide (LASIX) 40 MG tablet [id number] No Take 40 mg by mouth daily.  Patient not taking: Reported on 01/18/2022   [provider] Not Taking Active Self  gabapentin (NEURONTIN) 300 MG capsule [id number] No TAKE 1 CAPSULE BY MOUTH THREE TIMES DAILY  Patient not taking: Reported on 04/22/2022   [name], MD Not Taking Active   HUMIRA PEN 80 MG/0.8ML PNKT [id number] Yes Inject 80 mg into the skin once a week. Tuesday's [provider] Taking Active Self           Med Note [name], [name]   Wed Oct 24, 2021 12:28 PM)    methocarbamol (ROBAXIN) 750 MG tablet [id number] Yes Take 750 mg by mouth every 8 (eight) hours as needed for muscle spasms. [provider] Taking Active   metroNIDAZOLE (FLAGYL) 250 MG tablet [id number] Yes Take 250 mg by mouth 3 (three) times daily. [provider] Taking Active   olmesartan (BENICAR) 20 MG tablet [id number] Yes Take 1 tablet by mouth once daily [name], MD Taking Active Self  traMADol (ULTRAM) 50 MG tablet [id number]  No Take 50 mg by mouth 3 (three) times daily.  Patient not taking: Reported on 04/22/2022   [provider] Not Taking Active   XARELTO 20 MG TABS tablet [id number] Yes Take 20 mg by mouth daily. [provider] Taking Active             Patient Active Problem List   Diagnosis Date Noted    Subdural hematoma ([place]) 11/12/2021   Asthma    GERD (gastroesophageal reflux disease)    SDH (subdural hematoma) ([place]) 10/24/2021   Multiple lipomas 03/10/2021   Healthcare maintenance 10/13/2020   Numbness 09/28/2020   Subcutaneous nodules 03/01/2019   Hypertension 10/19/2018   Sleep disorder 03/05/2018   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 ([place]) 08/19/2016   Morbid obesity ([place]) 08/19/2016   Solitary pulmonary nodule 05/06/2016   Hidradenitis suppurativa 04/27/2016   Chest pain 04/26/2016   Avascular necrosis of bone of hip, left s/p TRH ([place]) 03/19/2016   HIV (human immunodeficiency virus infection) ([place]) 03/18/2016   DVT (deep venous thrombosis) ([place]) 03/18/2016   Chronic kidney disease, stage 3a ([place]) 03/18/2016   Cholelithiasis 03/18/2016   Diverticulosis of colon without hemorrhage 03/18/2016    Conditions to be addressed/monitored per PCP order:   DVT  Care Plan : RN Care Manager Plan of Care  Updates made by Melissa Montane, RN since 05/31/2022 12:00 AM     Problem: Health Management needs related to DVT      Long-Range Goal: Development of Plan of Care to address Health Management needs related to DVT   Start Date: 01/18/2022  Expected End Date: 08/30/2022  Priority: High  Note:   Current Barriers:  Chronic Disease Management support and education needs related to DVT  RNCM Clinical Goal(s):  Patient will verbalize understanding of plan for management of DVT as evidenced by patient reports take all medications exactly as prescribed and will call provider for medication related questions as evidenced by patient reports and EMR documentation    attend all scheduled medical appointments: [facility] 04/25/22, Neurology 05/06/22, Eye Exam 05/07/22 and PCP in February as evidenced by provider documentation in EMR        continue to work with RN Care Manager and/or Social Worker to address care management and care coordination needs related to  DVT as evidenced by adherence to CM Team Scheduled appointments     through collaboration with Consulting civil engineer, provider, and care team.   Interventions: Inter-disciplinary care team collaboration (see longitudinal plan of care) Evaluation of current treatment plan related to  self management and patient's adherence to plan as established by provider   DVT  (Status:  Goal Met.)  Long Term Goal Evaluation of current treatment plan related to  DVT ,  self-management and patient's adherence to plan as established by provider. Discussed plans with patient for ongoing care management follow up and provided patient with direct contact information for care management team Provided education to patient re: health maintenance Reviewed medications with patient and discussed taking medications as directed Reviewed scheduled/upcoming provider appointments including [facility] on 06/03/22, Neurology on 10/10/22,  Assessed social determinant of health barriers Advised patient to take all medications to upcoming provider appointments RNCM will follow up in 3 months to assess for any needs   Patient Goals/Self-Care Activities: Take medications as prescribed   Attend all scheduled provider appointments Call provider office for new concerns or questions  Work with the social worker to address care coordination  needs and will continue to work with the clinical team to address health care and disease management related needs Discuss Physical Therapy with PCP for improving strength and mobility       Follow Up:  Patient agrees to Care Plan and Follow-up.  Plan: The Managed Medicaid care management team will reach out to the patient again over the next 90 days.  Date/time of next scheduled RN care management/care coordination outreach:  08/29/22 @ [place] RN, BSN [place]  [employer] manager

## 2022-05-31 NOTE — Patient Instructions (Signed)
Visit Information  [name] was given information about Medicaid Managed Care team care coordination services as a part of their Healthy [facility] Medicaid benefit. Allen Espinoza verbally consented to engagement with the [facility] Managed Care team.   If you are experiencing a medical emergency, please call 911 or report to your local emergency department or urgent care.   If you have a non-emergency medical problem during routine business hours, please contact your provider's office and ask to speak with a nurse.   For questions related to your Healthy [facility] health plan, please call: [phone] or visit the homepage here: [email or web address]  If you would like to schedule transportation through your Healthy [facility] plan, please call the following number at least 2 days in advance of your appointment: [phone]  For information about your ride after you set it up, call Ride Assist at [phone]. Use this number to activate a Will Call pickup, or if your transportation is late for a scheduled pickup. Use this number, too, if you need to make a change or cancel a previously scheduled reservation.  If you need transportation services right away, call [phone]. The after-hours call center is staffed 24 hours to handle ride assistance and urgent reservation requests (including discharges) 365 days a year. Urgent trips include sick visits, hospital discharge requests and life-sustaining treatment.  Call the [place] at [phone], at any time, 24 hours a day, 7 days a week. If you are in danger or need immediate medical attention call 911.  If you would like help to quit smoking, call 1-800-QUIT-NOW (727)603-0994) OR Espaol: 1-855-Djelo-Ya HD:1601594) o para ms informacin haga clic aqu or Text READY to 200-400 to register via text  [name],   Please see education materials related to health maintenance provided  by MyChart link.  Patient verbalizes understanding of instructions and care plan provided today and agrees to view in [place]. Active MyChart status and patient understanding of how to access instructions and care plan via MyChart confirmed with patient.     Telephone follow up appointment with Managed Medicaid care management team member scheduled for:08/29/22 @ [place] RN, [place] RN Care Coordinator   Following is a copy of your plan of care:  Care Plan : RN Care Manager Plan of Care  Updates made by Melissa Montane, RN since 05/31/2022 12:00 AM     Problem: Health Management needs related to DVT      Long-Range Goal: Development of Plan of Care to address Health Management needs related to DVT   Start Date: 01/18/2022  Expected End Date: 08/30/2022  Priority: High  Note:   Current Barriers:  Chronic Disease Management support and education needs related to DVT  RNCM Clinical Goal(s):  Patient will verbalize understanding of plan for management of DVT as evidenced by patient reports take all medications exactly as prescribed and will call provider for medication related questions as evidenced by patient reports and EMR documentation    attend all scheduled medical appointments: [facility] 04/25/22, Neurology 05/06/22, Eye Exam 05/07/22 and PCP in February as evidenced by provider documentation in EMR        continue to work with RN Care Manager and/or Social Worker to address care management and care coordination needs related to DVT as evidenced by adherence to CM Team Scheduled appointments     through collaboration with Consulting civil engineer, provider, and care team.   Interventions: Inter-disciplinary care team  collaboration (see longitudinal plan of care) Evaluation of current treatment plan related to  self management and patient's adherence to plan as established by provider   DVT  (Status:  Goal Met.)  Long Term Goal Evaluation of current  treatment plan related to  DVT ,  self-management and patient's adherence to plan as established by provider. Discussed plans with patient for ongoing care management follow up and provided patient with direct contact information for care management team Provided education to patient re: health maintenance Reviewed medications with patient and discussed taking medications as directed Reviewed scheduled/upcoming provider appointments including [facility] on 06/03/22, Neurology on 10/10/22,  Assessed social determinant of health barriers Advised patient to take all medications to upcoming provider appointments RNCM will follow up in 3 months to assess for any needs   Patient Goals/Self-Care Activities: Take medications as prescribed   Attend all scheduled provider appointments Call provider office for new concerns or questions  Work with the social worker to address care coordination needs and will continue to work with the clinical team to address health care and disease management related needs Discuss Physical Therapy with PCP for improving strength and mobility

## 2022-06-02 NOTE — Unmapped (Signed)
Dermatology Note    Assessment and Plan:      Severe Hurley 3 HS scrotal, inguinal and perirenal; improved on Humira, but flared with stopping metronidazole, in patient w/ hx of well controlled HIV  - We discussed the typical natural history, pathogenesis, treatment options, and expected course as well as the relapsing and sometimes recalcitrant nature of the disease.    - Discussed considering deroofing procedure for thighs and procedure for scrotal area with Reconstructive Urology, which patient deferred to both today  - Discussed increasing Humira dose to 80 mg weekly for better control  - Reviewed role of biologic therapy including humira and remicade   - Based on discussion above and r/b/a reviewed, mutual decision to proceed with the following:  - Continue Humira 40mg  [place] weekly    - Continue metronidazole 250 mg TID. Counseled regarding risk of peripheral neuropathy with long-term use.  - Continue triamcinolone (KENALOG) 0.1 % ointment; Apply topically Two (2) times a day. To affected area(s) prn for dermatitis in the gluteal cleft, appropriate use discussed     High Risk Medication Use  - Quant gold negative 08/21/2021    The patient was advised to call for an appointment should any new, changing, or symptomatic lesions develop.     RTC: No follow-ups on file. or sooner as needed   _________________________________________________________________      Chief Complaint     No chief complaint on file.      HPI     Dustin Prince is a 62 y.o. male who presents as a returning patient (last seen 07/02/2021) to Dermatology for follow up of hidradenitis suppurativa. At last visit, patient was to continue humira 40 mg injections weekly, continue metronidazole 250 mg 3 times daily, and start triamcinolone 0.1% ointment for HS.    Today, patient reports ***    Self-reported severity (0-5): {0-5:36085}  VAS pain today: {numbers 0-10:5044}  VAS average pain for the last month: {numbers 0-10:5044}  Requiring pain medication? {YES/NO:21013}  If so, what type/frequency? ***  How often in pain?  {hspain:53295}  Level of odor (0-5): {0-5 selection:53296}  Level of itching (0-5): {0-5 selection:53296}  Dressing changes needed for drainage:{hs dressings:52675}  How much drainage: {hsdrainage:52676}  Flare in the last month (Y/N)? {YES/NO:21013}  How long ago was the last flare? {last flare:53297}  Developing new lesions? {hsnewlesion:52677}  Number of inflammatory lesions montly: {number of lesions monthly:53298}  DLQI: ***  Current treatment: ***     How helpful is the current treatment in managing the following aspects of your disease?  Not at all helpful Somewhat helpful Very helpful   Pain      Decreasing length of flares      Decreasing new lesions      Drainage      Decreasing frequency of flares      Decreasing severity of flares      Odor        The patient denies any other new or changing lesions or areas of concern.     Pertinent Past Medical History     Social History:  Current or former smoker? never  Amount smoking: n/a  How many years: n/a  ED visits in the last 5 years? 6-10  Difficulty affording medications? most of the time  Marital Status: in relationship  Living with some one? Yes.     Prior treatments:  Topical: topical ABX  Systemic: clidnamycin; doxy (not very helpful),   Past surgical procedures: large WLE left  axilla c/b dehisence   Past laser procedures: none    Past Medical History, Family History, Social History, Medication List, Allergies, and Problem List were reviewed in the rooming section of Epic.     ROS: Other than symptoms mentioned in the HPI, no fevers, chills, or other skin complaints    Physical Examination     Gen: Well-appearing patient, appropriate, interactive, in no acute distress  SKIN (Focal Skin Exam): Per patient request, examination of bilateral axillae, chest, abdomen, bilateral thighs, groin, buttocks, and external genitalia was performed    location Abscess Inflamed nodule Non-inflamed nodule Draining sinus Non-draining Sinus Hurley % scar   R axilla          L axilla          R inframammary          L inframammary          Intermammary          Pubic          R inguinal          R thigh          L inguinal          L thigh          Scrotum/Vulva          Perianal          R buttock          L buttock          Other (list)                      AN count (total sum of abscess and inflammatory nodule): ***  Pilonidal sinus? Yes, no, or previously excised ***    - Sites not commented on demonstrate normal findings.    Scribe's Attestation: [name], MD obtained and performed the history, physical exam and medical decision making elements that were entered into the chart.  Signed by Cherlynn Kaiser, Scribe, on ***.    {*** NOTE TO PROVIDER: PLEASE ADD ATTESTATION NOTING YOU AGREE WITH SCRIBE DOCUMENTATION}     (Approved Template 12/13/2019)

## 2022-06-02 NOTE — Unmapped (Addendum)
 For your HS:  - Continue humira 40 mg injections weekly  - Continue metronidazole 250 mg 3 times daily  - Start triamcinolone ointment twice daily as needed for flares in the gluteal cleft for up to 2 weeks at a time.  - Have TB test perform at visit with surgeons.    You are welcome to join the [facility] for HS of the [employer] group online at [email or web address] or in-person at our regular meetings.  You can textHS to [phone] for meeting reminders or join the group online for regular updates.  This can be a great opportunity to interact and learn from other patients and help work with the HS community.  We hope to see your there!    Hidradentis Suppurativa (pronounced ???high-drad-en-eye-tis/sup-your-uh-tee-vah???) is a chronic disease of hair follicles.  The lesions occur most commonly on areas of skin-to-skin contact: under the arms (axillary area), in the groin, around the buttocks, in the region around the anus and genitals, and on the skin between and under the breasts. In women, the underarms, groin, and breast areas are most commonly affected. Men most often have HS lesions on the buttocks and under the arms and may also have HS at the back of the neck and behind and around the ears.    What does HS look and feel like?   The first thing that someone with HS notices is a tender, raised, red bump that looks like an under-the-skin pimple or boil. Sometimes HS lesions have two or more ???heads.???  In mild disease only an occasional boil or abscess may occur, but in more active disease there can be many new lesions every month.  Some abscesses can become larger and may open and drain pus.  Bleeding and increased odor can also occur. In severe disease, deeper abscesses develop and may connect with each other under the skin to form tunnel-like tracts (sinuses, fistulas).  These may drain constantly, or may temporarily improve and then usually begin draining again over time.  In people who have had sinus tracts for some time, scars form that feel like ropes under the skin. In the very worst cases, networks of sinus tracts can form deeper in the body, including the muscle and other tissues. Many people with severe HS have scars that can limit their ability to freely move their arms or legs, though this is very unlikely for most patients.     Clinicians usually classify or ???grade??? HS using the [facility] staging system according to the severity of the disease for each body location:   Three Way stage I: one or more abscesses are present, but no sinus tracts have formed and no scars have developed   Doreene Adas stage II: one or more abscesses are present that resolve and recur; on sinus tract can be present and scarring is seen   Doreene Adas stage III: many abscesses and more than one sinus tract is present with extensive scars.    What causes HS?  The cause of HS is not completely understood.  It seems to be a disorder of hair follicles and often many family members are affected so genetics probably play a strong role.  Bacteria are often present and may make the disease worse, but infection does not seem to be the main cause. Hormones are also likely play a role since the condition typically starts around puberty when hair follicles under the arms and in the groin start to change.  It can sometimes flare with menstrual cycles in  women as well.  In most cases it lasts for decades and starts to improve to some extent in the late 30s and 40s as long as many fistulas have not already formed.  Women are three times more likely than men to develop HS.    Other factors are known to contribute to HS flaring or becoming worse, though they are likely not the main causes. The factors most commonly associated with HS include:   Cigarette smoking - Stopping smoking will likely not cure the disease, but likely is helpful in reducing how much and how often it flares and may prevent it from getting as bad over time.   Higher weight - HS may occur even in people that are not overweight, but it is much more common in patients that are.  There is some evidence that losing weight and eating a diet low in sugars and fats may be helpful in improving hidradenitis, though this is not helpful for everyone.  Working with a nutritionist may be an important way to help with this and is something your physician can help coordinate    Hidradenitis is not contagious.  It is not caused by a problem with personal hygiene or any other activity or behavior of those with the disease.    How can your doctor help you treat your hidradenitis?  Clinicians use both medication and surgery to treat HS. The choice of treatment--or combination of treatments--is made according to an individual patient???s needs. Clinicians consider several factors in determining the most appropriate plan for therapy:   Severity of disease - medications and some laser treatments are usually able to control disease best when fistulas are not present.  Fistulas typically require surgery.   Extent and location of disease   Chronicity (how often the lesions recur)    A number of different surgical methods have been developed that are useful for certain patients under particular circumstances. These can be done with local numbing and healing at home for some areas when disease is not too extensive with relatively brief recovery times.  In more extensive disease there may be a need for larger excisions under general anesthesia with healing time in the hospital and prolonged recovery periods for better disease control.      In addition, many medical treatments have been tried--some with more success than others. No medication is effective for all patients, and you and your doctor may have to try several different treatments or combinations of treatments before you find the treatment plan that works best for you.  The goals of therapy with medications that are either topical (used on the skin) or systemic (taken by mouth) are:  1. to clear the lesions or at least reduce their number and extent, and  2. to prevent new lesions from forming.  3. To reduce pain, drainage, and odor  Some of the types of medications commonly used are antibacterial skin washes and the topical antibiotics to prevent secondary infections and corticosteroid injections into the lesions to reduce inflammation.     Other medications that may be used include retinoids (similar to Accutane), drugs that effect how hormones and hair follicles interact, drugs that affect your immune system (such as methotrexate, adalimumab/Humira, and Remicaid/infliximab), steroids, and oral antibiotics.    Lasers that destroy hair follicles can also be helpful since they reduce the hair follicles that cause the problems.  Multiple treatments are typically required over time and there is some discomfort associated with treatment, but it is  typically very fast and well-tolerated.    It is very important to realize that hidradenitis cannot usually be completely cured with any single medication or surgical procedure.  It is a disease that can be very stubborn and difficult to control, but with good treatment a lot of improvement and sometimes temporary remissions can be obtained. Poorly controlled disease can cause more fistulas to form and make managing the disease much more difficult over time so it is important to seek care to reduce major flares.  Surgery can provide a long term cure in some areas, though the disease can start again or continue in nearby areas.  A dermatologist is often the best person to help coordinate disease treatment, and sometimes other surgeons, pain specialists, other specialists, and nutritionists may be part of the treatment team.    For severe disease, the first goal is often to reduce pain and symptoms with medicines so that the disease feels more stable. Once it's stable, we often start thinking about how to address areas that have completely gotten better with surgery if they are still causing problems.    What can you do to help your HS?  1. Stopping smoking is hard and may not fix everything, but it may be a step in the right direction.  We or your primary care physician can provide resources to help stop if you are interested.  2. Follow a healthy diet and try to achieve a healthy weight.  Some other self-help measures are:   Keep your skin cool and dry (becoming overheated and sweating can contribute to an HS flare)   To reduce the pain of cysts or nodules or to help them to drain, apply hot compresses or soak in hot water for 10 minutes at a time (use a clean washcloth or a teabag soaked in hot water)   For male patients, cotton underwear that does not have tight elastic in the groin can be helpful.  Boyshort, brief, or boxer style underwear may be a better option as friction on hair follicles in affected areas can be a major trigger in some patients.  These can be easily found on [place] or with some retailers.  Fruit of the Loom and Underworks are two brands that are sometimes recommended.    Finally, know that you are not alone. Coping with the pain and other symptoms of HS can be very difficult, so it may be helpful to connect with others who live with HS. Patient groups and networks can be sources of important information and support. Some internet resources for information and connections are provided below.    Psychologytoday.com is a resource to find psychologists and therapists that can help support you in your are     [email or web address] can help connect with sexual health resources and counselors    Resources for Information    The Hidradenitis Suppurativa Foundation: A nonprofit organized by a group of physicians interested in treating and advancing research in hidradenitis suppurativa.  This group advocates for better care and research for hidradenitis and has educational materials put together specifically for patients that have been reviewed and produced by doctors and people with hidradenitis.    American Academy of Dermatology  ARanked.fi    [employer]  ElevatorPitchers.de.html  NORD: IT trainer for [employer]  https://www.rarediseases.org/rare-disease-information/rare-diseases/byID/358/viewAbstract  Trials of new medications for HS  Https://www.clinicaltrials.gov

## 2022-06-03 ENCOUNTER — Ambulatory Visit: Admit: 2022-06-03 | Discharge: 2022-06-04 | Payer: BLUE CROSS/BLUE SHIELD

## 2022-06-03 DIAGNOSIS — Z79899 Other long term (current) drug therapy: Secondary | ICD-10-CM | POA: Diagnosis not present

## 2022-06-03 DIAGNOSIS — L910 Hypertrophic scar: Secondary | ICD-10-CM | POA: Diagnosis not present

## 2022-06-03 DIAGNOSIS — L732 Hidradenitis suppurativa: Secondary | ICD-10-CM | POA: Diagnosis not present

## 2022-06-03 DIAGNOSIS — B20 Human immunodeficiency virus [HIV] disease: Secondary | ICD-10-CM | POA: Diagnosis not present

## 2022-06-03 MED ORDER — TRIAMCINOLONE ACETONIDE 0.1 % TOPICAL OINTMENT
Freq: Two times a day (BID) | TOPICAL | 2 refills | 0.00000 days | Status: CP
Start: 2022-06-03 — End: 2023-06-03

## 2022-06-03 NOTE — Unmapped (Signed)
Self-reported severity (0-5): 3  VAS pain today: 3  VAS average pain for the last month: 3  Requiring pain medication? No.  If so, what type/frequency? -  How often in pain?  few times a month  Level of odor (0-5): 4  Level of itching (0-5): 1  Dressing changes needed for drainage:No drainage/less than once weekly  How much drainage: a lot of drainage  Flare in the last month (Y/N)? Yes.  How long ago was the last flare? in the last year  Developing new lesions? more than one year ago  Number of inflammatory lesions montly: 1-3  DLQI: 15  Current treatment: see main note     How helpful is the current treatment in managing the following aspects of your disease?  Not at all helpful Somewhat helpful Very helpful   Pain  x    Decreasing length of flares  x    Decreasing new lesions   x   Drainage  x    Decreasing frequency of flares  x    Decreasing severity of flares  x    Odor x

## 2022-06-03 NOTE — Unmapped (Signed)
[facility] Refill Coordination Note    Specialty Medication(s) to be Shipped:   Inflammatory Disorders: Humira    Other medication(s) to be shipped: No additional medications requested for fill at this time     Dustin Prince, DOB: Jun 20, 1960  Phone: [phone] (home)       All above HIPAA information was verified with patient.     Was a Nurse, learning disability used for this call? No    Completed refill call assessment today to schedule patient's medication shipment from the [facility] [phone]).  All relevant notes have been reviewed.     Specialty medication(s) and dose(s) confirmed: Regimen is correct and unchanged.   Changes to medications: [name] reports no changes at this time.  Changes to insurance: No  New side effects reported not previously addressed with a pharmacist or physician: None reported  Questions for the pharmacist: No    Confirmed patient received a Conservation officer, historic buildings and a Surveyor, mining with first shipment. The patient will receive a drug information handout for each medication shipped and additional FDA Medication Guides as required.       DISEASE/MEDICATION-SPECIFIC INFORMATION        For patients on injectable medications: Patient currently has 2 doses left.  Next injection is scheduled for 3/5.    SPECIALTY MEDICATION ADHERENCE     Medication Adherence    Patient reported X missed doses in the last month: 0  Specialty Medication: Humira 80mg /0.8 mL  Patient is on additional specialty medications: No  Informant: patient  Support network for adherence: family member              Were doses missed due to medication being on hold? No    Humira 80/0.8 mg/ml: 8 days of medicine on hand       REFERRAL TO PHARMACIST     Referral to the pharmacist: Not needed      [facility]     Shipping address confirmed in Epic.     Patient was notified of new phone menu : Yes    Delivery Scheduled: Yes, Expected medication delivery date: 3/8.     Medication will be delivered via UPS to the prescription address in Epic WAM.    [name]   [facility] Specialty Technician

## 2022-06-06 ENCOUNTER — Other Ambulatory Visit: Payer: Self-pay | Admitting: Pharmacist

## 2022-06-06 ENCOUNTER — Other Ambulatory Visit (HOSPITAL_COMMUNITY): Payer: Self-pay

## 2022-06-06 ENCOUNTER — Other Ambulatory Visit: Payer: Self-pay

## 2022-06-06 DIAGNOSIS — B20 Human immunodeficiency virus [HIV] disease: Secondary | ICD-10-CM

## 2022-06-06 MED ORDER — CABOTEGRAVIR & RILPIVIRINE ER 600 & 900 MG/3ML IM SUER
1.0000 | INTRAMUSCULAR | 5 refills | Status: DC
  Filled 2022-06-06: qty 6, 34d supply, fill #0
  Filled 2022-06-06: qty 6, 60d supply, fill #0
  Filled 2022-08-01: qty 6, 34d supply, fill #1
  Filled 2022-10-08 – 2022-10-10 (×2): qty 6, 34d supply, fill #2
  Filled 2022-11-25: qty 6, 34d supply, fill #3
  Filled 2023-02-03: qty 6, 34d supply, fill #4
  Filled 2023-04-04: qty 6, 34d supply, fill #5

## 2022-06-06 MED FILL — HUMIRA(CF) PEN 80 MG/0.8 ML SUBCUTANEOUS KIT: SUBCUTANEOUS | 84 days supply | Qty: 12 | Fill #3

## 2022-06-07 ENCOUNTER — Telehealth: Payer: Self-pay

## 2022-06-07 NOTE — Telephone Encounter (Signed)
[facility] Patient Advocate Encounter  Patient's medication [name] have been couriered to [facility] from [facility] and will be administered on the patient next office visit on 06/13/22.  [name] , [facility] Patient [facility] Phone: [phone] Fax:  [phone]

## 2022-06-13 ENCOUNTER — Ambulatory Visit (INDEPENDENT_AMBULATORY_CARE_PROVIDER_SITE_OTHER): Payer: Medicaid Other | Admitting: Pharmacist

## 2022-06-13 ENCOUNTER — Other Ambulatory Visit: Payer: Self-pay

## 2022-06-13 DIAGNOSIS — B20 Human immunodeficiency virus [HIV] disease: Secondary | ICD-10-CM | POA: Diagnosis present

## 2022-06-13 DIAGNOSIS — L732 Hidradenitis suppurativa: Principal | ICD-10-CM

## 2022-06-13 MED ORDER — CABOTEGRAVIR & RILPIVIRINE ER 600 & 900 MG/3ML IM SUER
1.0000 | Freq: Once | INTRAMUSCULAR | Status: AC
  Administered 2022-06-13: 1 via INTRAMUSCULAR

## 2022-06-13 NOTE — Progress Notes (Signed)
HPI: Allen Espinoza is a 62 y.o. male who presents to the [facility] for [employer] administration.  Patient Active Problem List   Diagnosis Date Noted   Subdural hematoma ([place]) 11/12/2021   Asthma    GERD (gastroesophageal reflux disease)    SDH (subdural hematoma) ([place]) 10/24/2021   Multiple lipomas 03/10/2021   Healthcare maintenance 10/13/2020   Numbness 09/28/2020   Subcutaneous nodules 03/01/2019   Hypertension 10/19/2018   Sleep disorder 03/05/2018   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 ([place]) 08/19/2016   Morbid obesity ([place]) 08/19/2016   Solitary pulmonary nodule 05/06/2016   Hidradenitis suppurativa 04/27/2016   Chest pain 04/26/2016   Avascular necrosis of bone of hip, left s/p TRH ([place]) 03/19/2016   HIV (human immunodeficiency virus infection) ([place]) 03/18/2016   DVT (deep venous thrombosis) ([place]) 03/18/2016   Chronic kidney disease, stage 3a ([place]) 03/18/2016   Cholelithiasis 03/18/2016   Diverticulosis of colon without hemorrhage 03/18/2016    Patient's Medications  New Prescriptions   No medications on file  Previous Medications   CABOTEGRAVIR & RILPIVIRINE ER (CABENUVA) 600 & 900 MG/3ML INJECTION    Inject 1 kit into the muscle every 2 (two) months.   CLONAZEPAM (KLONOPIN) 1 MG TABLET    Take 1 mg by mouth at bedtime.   FUROSEMIDE (LASIX) 40 MG TABLET    Take 40 mg by mouth daily.   GABAPENTIN (NEURONTIN) 300 MG CAPSULE    TAKE 1 CAPSULE BY MOUTH THREE TIMES DAILY   HUMIRA PEN 80 MG/0.8ML PNKT    Inject 80 mg into the skin once a week. Tuesday's   METHOCARBAMOL (ROBAXIN) 750 MG TABLET    Take 750 mg by mouth every 8 (eight) hours as needed for muscle spasms.   METRONIDAZOLE (FLAGYL) 250 MG TABLET    Take 250 mg by mouth 3 (three) times daily.   OLMESARTAN (BENICAR) 20 MG TABLET    Take 1 tablet by mouth once daily   TRAMADOL (ULTRAM) 50 MG TABLET    Take 50 mg by mouth 3 (three) times daily.   XARELTO 20 MG TABS  TABLET    Take 20 mg by mouth daily.  Modified Medications   No medications on file  Discontinued Medications   No medications on file    Allergies: Allergies  Allergen Reactions   Sulfa Antibiotics Other (See Comments)    Low blood pressure   Vancomycin Itching    Give with benadryl    Past Medical History: Past Medical History:  Diagnosis Date   Abscess    Between legs   Arthritis    all over   Asthma    Avascular necrosis of femoral head, left (HCC)    Blood transfusion without reported diagnosis    Cholelithiasis    CKD (chronic kidney disease) stage 3, GFR 30-59 ml/min (HCC)    sees kidney Dr.   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[place])    left DVT   Diverticulosis    DVT (deep venous thrombosis) (HCC)    legs   Dyspnea    when walking   Dysrhythmia    remembers mother taking about having an irregular rhythm years when he was a child    GERD (gastroesophageal reflux disease)    HIV (human immunodeficiency virus infection) ([name])    Hypertension    Morbid obesity ([place])     Social History: Social History   Socioeconomic History   Marital status: Single  Spouse name: Not on file   Number of children: 2   Years of education: Not on file   Highest education level: Not on file  Occupational History   Occupation: Disability  Tobacco Use   Smoking status: Never   Smokeless tobacco: Never  Vaping Use   Vaping Use: Never used  Substance and Sexual Activity   Alcohol use: No    Comment: prior   Drug use: No    Comment: prior cocaine use, last 2005   Sexual activity: Not Currently    Partners: Female    Birth control/protection: Condom    Comment: declined condoms 05/2020  Other Topics Concern   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, [employer], warehouse work   Lives w/ fiancee   No EtOH, tobacco, drugs   Social Determinants of Health   Financial Resource Strain: Medium Risk (02/18/2022)   Overall Financial Resource Strain  (CARDIA)    Difficulty of Paying Living Expenses: Somewhat hard  Food Insecurity: No Food Insecurity (04/22/2022)   Hunger Vital Sign    Worried About Running Out of Food in the Last Year: Never true    [place] in the Last Year: Never true  Transportation Needs: No Transportation Needs (04/22/2022)   PRAPARE - Hydrologist (Medical): No    Lack of Transportation (Non-Medical): No  Physical Activity: Insufficiently Active (12/10/2017)   Exercise Vital Sign    Days of Exercise per Week: 2 days    Minutes of Exercise per Session: 10 min  Stress: No Stress Concern Present (12/10/2017)   [place]    Feeling of Stress : Only a little  Social Connections: Somewhat Isolated (12/10/2017)   Social Connection and Isolation Panel [NHANES]    Frequency of Communication with Friends and Family: Three times a week    Frequency of Social Gatherings with Friends and Family: More than three times a week    Attends Religious Services: Never    Marine scientist or Organizations: No    Attends Archivist Meetings: Never    Marital Status: Living with partner    Labs: Lab Results  Component Value Date   HIV1RNAQUANT Not Detected 04/25/2022   HIV1RNAQUANT Not Detected 02/12/2022   HIV1RNAQUANT Not Detected 10/18/2021   HIV1RNAVL 47,300 03/18/2016   CD4TABS 434 04/25/2022   CD4TABS 441 06/21/2021   CD4TABS 440 08/17/2020    RPR and STI Lab Results  Component Value Date   LABRPR NON-REACTIVE 04/25/2022   LABRPR NON-REACTIVE 06/21/2021   LABRPR NON-REACTIVE 02/01/2020   LABRPR NON-REACTIVE 08/18/2018   LABRPR NON-REACTIVE 05/01/2017    STI Results GC CT  02/13/2021  9:52 AM Negative  Negative   02/01/2020 11:01 AM Negative  Negative   03/03/2019 12:00 AM Negative  C Negative  C  05/01/2017 12:00 AM Negative  Negative   08/07/2016 12:00 AM Negative  Negative    04/26/2016 12:00 AM Negative  Negative     C Corrected result    Hepatitis B Lab Results  Component Value Date   HEPBSAB REACTIVE (A) 11/19/2016   HEPBSAG NON-REACTIVE 11/19/2016   Hepatitis C No results found for: "HEPCAB", "HCVRNAPCRQN" Hepatitis A No results found for: "HAV" Lipids: Lab Results  Component Value Date   CHOL 161 04/25/2022   TRIG 65 04/25/2022   HDL 67 04/25/2022   CHOLHDL 2.4 04/25/2022   VLDL  16 08/07/2016   LDLCALC 80 04/25/2022    TARGET DATE: The 20th  Assessment: Allen Espinoza presents today for his maintenance Cabenuva injections. Past injections were tolerated well without issues. Patient politely declined STI testing today. We will continue checking his HIV RNA every 4 months (his next appointment). Asked patient if he would be willing to test for HepA Ab during his next appointment to determine need for future vaccination and he was agreeable.   Administered cabotegravir '600mg'$ /47m in left upper outer quadrant of the gluteal muscle. Administered rilpivirine 900 mg/313min the right upper outer quadrant of the gluteal muscle. No issues with injections. He will follow up in 2 months for next set of injections.  Plan: - Cabenuva injections administered - Next injections scheduled for 08/15/22 with AmEstill Bamberg7/18/24 with GrMarya Amslerand 12/17/22 with AmEstill Bamberg- Call with any issues or questions   SyBilley GoslingPharmD PGY1 Pharmacy Resident 3/14/20249:42 AM

## 2022-06-17 NOTE — Unmapped (Signed)
PA INITIATED FOR Humira Pen (CF) 80MG /0.8ML pen-injector kit Key: [id number]   SENT TO PLAN TODAY

## 2022-06-22 DIAGNOSIS — L732 Hidradenitis suppurativa: Principal | ICD-10-CM

## 2022-06-22 MED ORDER — METRONIDAZOLE 250 MG TABLET
ORAL_TABLET | Freq: Three times a day (TID) | ORAL | 0 refills | 0.00000 days
Start: 2022-06-22 — End: ?

## 2022-06-24 MED ORDER — METRONIDAZOLE 250 MG TABLET
ORAL_TABLET | Freq: Three times a day (TID) | ORAL | 0 refills | 30.00000 days | Status: CP
Start: 2022-06-24 — End: ?

## 2022-06-27 NOTE — Unmapped (Signed)
In error-wrong expiration date

## 2022-06-27 NOTE — Unmapped (Signed)
PA APPROVAL FOR Humira Pen (CF) 80MG /0.8ML pen-injector kit (Key: [id number])  PA Case ID #: [id number]

## 2022-07-25 DIAGNOSIS — L732 Hidradenitis suppurativa: Principal | ICD-10-CM

## 2022-07-25 MED ORDER — METRONIDAZOLE 250 MG TABLET
ORAL_TABLET | Freq: Three times a day (TID) | ORAL | 0 refills | 30.00000 days | Status: CP
Start: 2022-07-25 — End: ?

## 2022-07-29 ENCOUNTER — Other Ambulatory Visit: Payer: Self-pay

## 2022-08-01 ENCOUNTER — Other Ambulatory Visit (HOSPITAL_COMMUNITY): Payer: Self-pay

## 2022-08-02 ENCOUNTER — Other Ambulatory Visit: Payer: Self-pay

## 2022-08-06 ENCOUNTER — Telehealth: Payer: Self-pay

## 2022-08-06 NOTE — Telephone Encounter (Signed)
[facility] Patient Advocate Encounter  Patient's medication Allen Espinoza) have been couriered to [facility] from [employer] and will be administered on the patient next office visit on 08/15/22.  Clearance Coots , CPhT [facility]ty Pharmacy Patient [facility] Phone: [phone] Fax:  [phone]

## 2022-08-15 ENCOUNTER — Other Ambulatory Visit: Payer: Self-pay

## 2022-08-15 ENCOUNTER — Ambulatory Visit (INDEPENDENT_AMBULATORY_CARE_PROVIDER_SITE_OTHER): Payer: Medicaid Other | Admitting: Pharmacist

## 2022-08-15 DIAGNOSIS — Z23 Encounter for immunization: Secondary | ICD-10-CM

## 2022-08-15 DIAGNOSIS — B20 Human immunodeficiency virus [HIV] disease: Secondary | ICD-10-CM

## 2022-08-15 DIAGNOSIS — Z113 Encounter for screening for infections with a predominantly sexual mode of transmission: Secondary | ICD-10-CM

## 2022-08-15 MED ORDER — CABOTEGRAVIR & RILPIVIRINE ER 600 & 900 MG/3ML IM SUER
1.0000 | Freq: Once | INTRAMUSCULAR | Status: AC
Start: 2022-08-15 — End: 2022-08-15
  Administered 2022-08-15: 1 via INTRAMUSCULAR

## 2022-08-15 NOTE — Progress Notes (Signed)
HPI: Allen Espinoza is a 62 y.o. male who presents to the [facility] for [employer] administration.  Patient Active Problem List   Diagnosis Date Noted   Subdural hematoma ([place]) 11/12/2021   Asthma    GERD (gastroesophageal reflux disease)    SDH (subdural hematoma) ([place]) 10/24/2021   Multiple lipomas 03/10/2021   Healthcare maintenance 10/13/2020   Numbness 09/28/2020   Subcutaneous nodules 03/01/2019   Hypertension 10/19/2018   Sleep disorder 03/05/2018   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 ([place]) 08/19/2016   Morbid obesity ([place]) 08/19/2016   Solitary pulmonary nodule 05/06/2016   Hidradenitis suppurativa 04/27/2016   Chest pain 04/26/2016   Avascular necrosis of bone of hip, left s/p TRH ([place]) 03/19/2016   HIV (human immunodeficiency virus infection) ([place]) 03/18/2016   DVT (deep venous thrombosis) ([place]) 03/18/2016   Chronic kidney disease, stage 3a ([place]) 03/18/2016   Cholelithiasis 03/18/2016   Diverticulosis of colon without hemorrhage 03/18/2016    Patient's Medications  New Prescriptions   No medications on file  Previous Medications   CABOTEGRAVIR & RILPIVIRINE ER (CABENUVA) 600 & 900 MG/3ML INJECTION    Inject 1 kit into the muscle every 2 (two) months.   CLONAZEPAM (KLONOPIN) 1 MG TABLET    Take 1 mg by mouth at bedtime.   FUROSEMIDE (LASIX) 40 MG TABLET    Take 40 mg by mouth daily.   GABAPENTIN (NEURONTIN) 300 MG CAPSULE    TAKE 1 CAPSULE BY MOUTH THREE TIMES DAILY   HUMIRA PEN 80 MG/0.8ML PNKT    Inject 80 mg into the skin once a week. Tuesday's   METHOCARBAMOL (ROBAXIN) 750 MG TABLET    Take 750 mg by mouth every 8 (eight) hours as needed for muscle spasms.   METRONIDAZOLE (FLAGYL) 250 MG TABLET    Take 250 mg by mouth 3 (three) times daily.   OLMESARTAN (BENICAR) 20 MG TABLET    Take 1 tablet by mouth once daily   TRAMADOL (ULTRAM) 50 MG TABLET    Take 50 mg by mouth 3 (three) times daily.   XARELTO 20 MG TABS  TABLET    Take 20 mg by mouth daily.  Modified Medications   No medications on file  Discontinued Medications   No medications on file    Allergies: Allergies  Allergen Reactions   Sulfa Antibiotics Other (See Comments)    Low blood pressure   Vancomycin Itching    Give with benadryl    Past Medical History: Past Medical History:  Diagnosis Date   Abscess    Between legs   Arthritis    all over   Asthma    Avascular necrosis of femoral head, left (HCC)    Blood transfusion without reported diagnosis    Cholelithiasis    CKD (chronic kidney disease) stage 3, GFR 30-59 ml/min (HCC)    sees kidney Dr.   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[place])    left DVT   Diverticulosis    DVT (deep venous thrombosis) (HCC)    legs   Dyspnea    when walking   Dysrhythmia    remembers mother taking about having an irregular rhythm years when he was a child    GERD (gastroesophageal reflux disease)    HIV (human immunodeficiency virus infection) ([name])    Hypertension    Morbid obesity ([place])     Social History: Social History   Socioeconomic History   Marital status: Single  Spouse name: Not on file   Number of children: 2   Years of education: Not on file   Highest education level: Not on file  Occupational History   Occupation: Disability  Tobacco Use   Smoking status: Never   Smokeless tobacco: Never  Vaping Use   Vaping Use: Never used  Substance and Sexual Activity   Alcohol use: No    Comment: prior   Drug use: No    Comment: prior cocaine use, last 2005   Sexual activity: Not Currently    Partners: Female    Birth control/protection: Condom    Comment: declined condoms 05/2020  Other Topics Concern   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, [employer], warehouse work   Lives w/ fiancee   No EtOH, tobacco, drugs   Social Determinants of Health   Financial Resource Strain: Medium Risk (02/18/2022)   Overall Financial Resource Strain  (CARDIA)    Difficulty of Paying Living Expenses: Somewhat hard  Food Insecurity: No Food Insecurity (04/22/2022)   Hunger Vital Sign    Worried About Running Out of Food in the Last Year: Never true    [place] in the Last Year: Never true  Transportation Needs: No Transportation Needs (04/22/2022)   PRAPARE - Hydrologist (Medical): No    Lack of Transportation (Non-Medical): No  Physical Activity: Insufficiently Active (12/10/2017)   Exercise Vital Sign    Days of Exercise per Week: 2 days    Minutes of Exercise per Session: 10 min  Stress: No Stress Concern Present (12/10/2017)   [place]    Feeling of Stress : Only a little  Social Connections: Somewhat Isolated (12/10/2017)   Social Connection and Isolation Panel [NHANES]    Frequency of Communication with Friends and Family: Three times a week    Frequency of Social Gatherings with Friends and Family: More than three times a week    Attends Religious Services: Never    Marine scientist or Organizations: No    Attends Archivist Meetings: Never    Marital Status: Living with partner    Labs: Lab Results  Component Value Date   HIV1RNAQUANT Not Detected 04/25/2022   HIV1RNAQUANT Not Detected 02/12/2022   HIV1RNAQUANT Not Detected 10/18/2021   HIV1RNAVL 47,300 03/18/2016   CD4TABS 434 04/25/2022   CD4TABS 441 06/21/2021   CD4TABS 440 08/17/2020    RPR and STI Lab Results  Component Value Date   LABRPR NON-REACTIVE 04/25/2022   LABRPR NON-REACTIVE 06/21/2021   LABRPR NON-REACTIVE 02/01/2020   LABRPR NON-REACTIVE 08/18/2018   LABRPR NON-REACTIVE 05/01/2017    STI Results GC CT  02/13/2021  9:52 AM Negative  Negative   02/01/2020 11:01 AM Negative  Negative   03/03/2019 12:00 AM Negative  C Negative  C  05/01/2017 12:00 AM Negative  Negative   08/07/2016 12:00 AM Negative  Negative    04/26/2016 12:00 AM Negative  Negative     C Corrected result    Hepatitis B Lab Results  Component Value Date   HEPBSAB REACTIVE (A) 11/19/2016   HEPBSAG NON-REACTIVE 11/19/2016   Hepatitis C No results found for: "HEPCAB", "HCVRNAPCRQN" Hepatitis A No results found for: "HAV" Lipids: Lab Results  Component Value Date   CHOL 161 04/25/2022   TRIG 65 04/25/2022   HDL 67 04/25/2022   CHOLHDL 2.4 04/25/2022   VLDL  16 08/07/2016   LDLCALC 80 04/25/2022    TARGET DATE: The 20th  Assessment: Issak presents today for his maintenance Cabenuva injections. His past injections were tolerated well without issues.The patient politely declined STI testing today. We will continue checking his HIV RNA every 4 months with a check at today's visit. I asked the patient if he would be willing to test for HepA Ab today to determine the need for future vaccination and he was agreeable.   Administered cabotegravir 600mg /51mL in left upper outer quadrant of the gluteal muscle. Administered rilpivirine 900 mg/56mL in the right upper outer quadrant of the gluteal muscle. No issues with injections. He will follow up in 2 months for next set of injections.  Plan: - Cabenuva injections administered - HIV RNA ordered - Hep A titer ordered - Next injections scheduled for 10/17/22 with Tammy Sours, and 12/17/22 with Marchelle Folks  - Call with any issues or questions  Thanks,  Arabella Merles, PharmD. [facility] PGY-1  08/15/2022 9:08 AM

## 2022-08-18 LAB — HIV-1 RNA QUANT-NO REFLEX-BLD
HIV 1 RNA Quant: NOT DETECTED Copies/mL
HIV-1 RNA Quant, Log: NOT DETECTED Log cps/mL

## 2022-08-18 LAB — HEPATITIS A ANTIBODY, TOTAL: Hepatitis A AB,Total: REACTIVE — AB

## 2022-08-19 ENCOUNTER — Telehealth: Payer: Self-pay

## 2022-08-19 NOTE — Telephone Encounter (Signed)
Patient called and LVM requesting his lab results. Patient would like to discuss them with you guys. Yara Tomkinson T Pricilla Loveless

## 2022-08-19 NOTE — Telephone Encounter (Signed)
Called Allen Espinoza to explain his hepatitis A antibody results. 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 All questions and concerns addressed.  Irish Elders, PharmD PGY-1 [facility] Pharmacy Resident

## 2022-08-19 NOTE — Telephone Encounter (Signed)
Received call from patient worried about reactive hep A antibody lab. Discussed that this indicates immunity to HAV and no further action necessary. Patient verbalized understanding and has no further questions.   Sandie Ano, RN

## 2022-08-20 NOTE — Unmapped (Signed)
patient called looking for email address for Melissa.French Ana went over the email address with arr the (.)

## 2022-08-22 DIAGNOSIS — L732 Hidradenitis suppurativa: Principal | ICD-10-CM

## 2022-08-22 MED ORDER — HUMIRA(CF) PEN 80 MG/0.8 ML SUBCUTANEOUS KIT
SUBCUTANEOUS | 3 refills | 84 days
Start: 2022-08-22 — End: ?

## 2022-08-23 MED ORDER — HUMIRA(CF) PEN 80 MG/0.8 ML SUBCUTANEOUS KIT
SUBCUTANEOUS | 3 refills | 84.00000 days | Status: CP
Start: 2022-08-23 — End: ?

## 2022-08-23 NOTE — Unmapped (Signed)
Prescription refill request for humira 80mg /0.6ml, Last office visit was 05/31/22    Please Advise

## 2022-08-26 DIAGNOSIS — L732 Hidradenitis suppurativa: Principal | ICD-10-CM

## 2022-08-29 ENCOUNTER — Other Ambulatory Visit: Payer: Medicaid Other | Admitting: *Deleted

## 2022-08-29 ENCOUNTER — Encounter: Payer: Self-pay | Admitting: *Deleted

## 2022-08-29 DIAGNOSIS — L732 Hidradenitis suppurativa: Principal | ICD-10-CM

## 2022-08-29 MED ORDER — METRONIDAZOLE 250 MG TABLET
ORAL_TABLET | Freq: Three times a day (TID) | ORAL | 0 refills | 0.00000 days
Start: 2022-08-29 — End: ?

## 2022-08-29 NOTE — Patient Outreach (Signed)
   Embedded Care Coordination  Case Closure Note  08/29/2022 Name: ARJENIS INDOVINA MRN: [id number] DOB: 12-09-1960  DANNE HEMPLE is a 62 y.o. year old male who is a primary care patient of Burton Apley, MD. The Embedded Care Coordination team was consulted for assistance with chronic disease management and care coordination needs related to  DVT  The patient has met all care management goals, agreed to case closure, and has been provided with contact information for the care management team. Appropriate care team members and provider have been notified via electronic communication. The care management team is available to provide care management/care coordination support at any time in the future should needs arise. Further engagement requires referral order ([id number]).   If patient returns call to provider office and is in need of assistance from the embedded care coordination team, please advise that the patient call the [facility] Care Guide at [phone] for assistance.   Estanislado Emms RN, BSN [place]  Managed [facility] RN Care Coordinator [phone]

## 2022-08-30 MED ORDER — METRONIDAZOLE 250 MG TABLET
ORAL_TABLET | Freq: Three times a day (TID) | ORAL | 2 refills | 30.00000 days | Status: CP
Start: 2022-08-30 — End: ?

## 2022-08-30 NOTE — Unmapped (Signed)
Nurse received voicemail from pt stating:    Dustin Prince [phone]. [phone] Dr. Janyth Contes is my doctor. I'm having a problem getting my insurance to pay for my medicine, Humira. I need you to talk to the pharmacist.    Message routed to PA pool

## 2022-09-02 NOTE — Unmapped (Signed)
LOV: 3/34/24    Key: [id number]    Dr Sharin Mons,    PA for Humira has been sent to plan .    As I messaged earlier, this will likely be denied, but we have to go through the process.  The expected denial letter will give me information on where to send your appeal letter.    Thanks Clydie Braun

## 2022-09-02 NOTE — Unmapped (Signed)
Received call on nurse phone line from pt stating that insurance will not approve his medication due to dosing. Reiterated to pt that we are working on this process and reread the message that was sent through [place] earlier today about mediation insurance/appeal process. advised pt to reach out if symptoms start getting worse and told him the office hours. Pt verbalized understanding

## 2022-09-03 DIAGNOSIS — N183 Chronic kidney disease, stage 3 unspecified: Secondary | ICD-10-CM | POA: Diagnosis not present

## 2022-09-03 DIAGNOSIS — N189 Chronic kidney disease, unspecified: Secondary | ICD-10-CM | POA: Diagnosis not present

## 2022-09-03 NOTE — Unmapped (Signed)
PA request for Humira was denied. The requested medication must meet specific criteria. This decision may be appealed. Letter will be scanned into the chart under the Media Tab.

## 2022-09-04 NOTE — Unmapped (Signed)
LOV:  06/03/22    Hello Dr. Sharin Mons,  I faxed the Humira denial letter, appeal letter and chart notes to [facility] @ [phone] and receipt was verified.     Dustin Prince

## 2022-09-04 NOTE — Unmapped (Signed)
TC to Dustin Prince. Let him know we are working with the pharmacy to get the higher dose from the free medication program. We've submitted everything and are just waiting to hear back at this point, which should be in the next week or two.  The insurance will only approve one injection every 2 weeks, but we're trying to do it every week and if we can't get the free medication we can go back to every 2 weeks through the insurance and then figure out another long-term plan     Dustin Prince expressed understanding . He reports he is doing well on Humira but is due for an injection now. He'll wait for an update.He continues to take metronidazole and apply triamcinolone ointment as needed.

## 2022-09-11 NOTE — Progress Notes (Signed)
The 10-year ASCVD risk score (Arnett DK, et al., 2019) is: 16%   Values used to calculate the score:     Age: 62 years     Sex: Male     Is Non-Hispanic African American: Yes     Diabetic: No     Tobacco smoker: No     Systolic Blood Pressure: 144 mmHg     Is BP treated: Yes     HDL Cholesterol: 67 mg/dL     Total Cholesterol: 161 mg/dL  Sandie Ano, RN

## 2022-09-17 NOTE — Unmapped (Signed)
Pt lvm on nurse line stating that he is having issues with his insurance policy.     Call returned. Pt states that his insurance did not approve Humira due to the dose. Pt informed that an appeal was submitted by our office on 09/09/22 and that if could be another week before a decision is made. Pt expressed understanding.

## 2022-10-08 ENCOUNTER — Other Ambulatory Visit (HOSPITAL_COMMUNITY): Payer: Self-pay

## 2022-10-09 ENCOUNTER — Other Ambulatory Visit (HOSPITAL_COMMUNITY): Payer: Self-pay

## 2022-10-09 NOTE — Progress Notes (Unsigned)
[facility] [address] [place] [address] [phone]       OFFICE FOLLOW UP NOTE  [name] Date of Birth:  1960/07/21 Medical Record Number:  [id number]   Reason for visit: Initial CPAP follow-up    SUBJECTIVE:   CHIEF COMPLAINT:  No chief complaint on file.   HPI:   Update 10/10/2022 JM: Patient returns for CPAP follow-up.       History provided for reference purposes only Update 10/30/2021 JM: Patient is being seen for initial CPAP compliance visit.  Completed HST 5/17 which showed moderate OSA with total AHI of 19.1/h and O2 nadir of 84%.  Recommend initiating AutoPap which was started on 6/7.  Review of initial compliance report as below.  Reports good tolerance of CPAP without any concerns. He does report occasionally falling asleep in chair prior to going to bedroom where his CPAP is. He will occasionally get up in the middle the night to go to the bathroom and forgets to put his CPAP back on.  Epworth Sleepiness Scale 4/24  Reports headaches have since subsided, remains on gabapentin 100 mg 3 times daily without side effects.  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  Does still have occasional vivid dreams or acting out dreams.  He is prescribed to take 2 tablets of 0.5 mg nightly but only takes 1 tablet per night.   He also mentions recent ED eval post fall with evidence of small SDH.  Xarelto was placed on hold, evan by neurosurgery without intervention and repeat CTH showed slight decrease in size.  He reports he was advised to f/u with our office to discuss restarting Xarelto.   No further concerns at this time     CT HEAD 10/24/2021 (10:00) IMPRESSION: Generalized atrophy.   Small LEFT lateral frontal and frontotemporal subdural hematoma measuring 3 mm thick without significant mass effect upon LEFT hemisphere.   No acute facial bone abnormalities.   Multilevel degenerative disc and facet disease  changes of the cervical spine with multifactorial spinal stenosis at C3-C4 and to a lesser degree at C4-C5 and C5-C6.   No acute cervical spine abnormalities.   CT HEAD 10/24/2021 (22:48) IMPRESSION: 1. Slight interval decrease in size of left-sided subdural hematoma, now measuring 2 mm in maximal thickness and only faintly seen overlying the left temporal convexity. No significant mass effect. 2. No other new acute intracranial abnormality. 3. Asymmetric soft tissue swelling with stranding involving the left periauricular soft tissues with a few small foci of superimposed soft tissue emphysema. Findings suspected to reflect posttraumatic soft tissue injury/contusion with laceration. 4. Asymmetric hyperattenuation involving the visualized left parotid gland. While this could be posttraumatic in nature, possible acute parotitis could also have this appearance. Correlation with physical exam recommended.          Update 08/07/2021 Dr. Frances Furbish:  Allen Espinoza is a 62 year old right-handed gentleman For a new problem visit of right-sided headaches.  The patient is referred by the emergency room.  I reviewed the records from 07/25/2021.  He reported a 1 month history of recurrent right-sided headaches.  He was symptomatically treated/started on gabapentin.    Work-up included a CT venogram of the head on 07/25/2021 and I reviewed the results:   IMPRESSION: 1. Negative for dural venous sinus thrombosis.   2. No acute intracranial abnormality by CT. Mild to moderate for age nonspecific white matter disease appears stable compared to brain MRI last year. He had a brain MRI with  and without contrast as well as a orbital MRI with and without contrast on 07/25/2021 and I reviewed the results: IMPRESSION: 1. No acute intracranial or orbital pathology. 2. Stable FLAIR signal abnormality in the subcortical and periventricular white matter likely reflecting sequela of chronic small vessel disease.     Laboratory testing from 07/25/2021 showed CRP of 0.7, BMP showed a glucose of 103, BUN 17, creatinine elevated at 1.5, CBC with differential showed elevated MCV at 102.8, MCH slightly elevated at 34.3, ESR mildly elevated at 55.   He was last seen in this clinic by Shawnie Dapper, NP on 02/21/2021, at which time he reported sleeping better, he was not having as many vivid dreams or acting out in his dreams.  He did report some sleep talking.  He was on clonazepam 0.5 mg each night.   He had a baseline sleep study on 05/14/2018 which did not show any significant obstructive sleep apnea.  He had an AHI of 0/h, O2 nadir was 90%.  He had movements noted during REM sleep and vocalizations, muscle tone was intermittently elevated during REM sleep.  He had mild intermittent snoring.     He has had interim weight fluctuations.    He reports an approximately 1 month history of recurrent right-sided headaches.  They can last for hours, denies any throbbing, seems more like a sharp or achy pain.  Starts in the right upper eye area above the eyebrow and tends to stay there, does not radiate or shoot.  He does not have any radiating neck pain.  Denies any associated photophobia or nausea or vomiting.  He has prescription eyeglasses but has not had an eye examination in 1-1/2 to 2 years.  He does need to find a new doctor as his doctor accepts his insurance as I understand.  He has been using BC powder, up to 3 a day.  He does snore.  He has tried the gabapentin 300 mg 3 times daily for about 3 days per emergency room prescription.  It probably helped a little bit as he recalls.  He no longer has a prescription.  He continues to take clonazepam 0.5 mg strength 1 pill at night.  He sleeps without any major dream enactment behavior.  He has had no recent focal neurological symptoms.     Update 02/21/21 ALL:  Allen Espinoza is a 62 y.o. male here today for follow up for RBD. He was last seen by Dr Frances Furbish 10/2020. He had been  off clonazepam for about 2 years as he did not feel it was effective. He reported vivid dreams had significantly worsened over this period of time. He was having more movements/enactment of dreams. He was advised to restart clonazepam 0.5mg  and titrate to 1mg  daily at bedtime. He reports sleeping better. He is no longer acting out dreams. He continues to sleep in separate bedroom from his wife. She has told him that he does talk in his sleep but has not seen any restless behavior. He was not able to tolerate 1mg  and has continued 0.5mg  daily. He is tolerting it well. Last refill 02/13/21 for 60 tablets. He does have intermittent numbness/tingling of his head. It can be right or left sided and occurs in different places each time. Symptoms do seem better and less frequent since last visit.      HISTORY (copied from Dr Teofilo Pod previous note)   Allen Espinoza is a 62 year old right-handed gentleman with an underlying complex medical history of arthritis, avascular  necrosis of the left femoral head, cholelithiasis, chronic kidney disease, diverticulosis, HIV, history of DVT, and morbid obesity with BMI of over 40, who presents for follow-up consultation of his sleep disorder, in particular, vivid dreams and dream enactment behaviors, in keeping with RBD.  The patient is unaccompanied today and presents after a longer gap of nearly 2 years.  I last saw him on 12/22/2018, at which time he reported that clonazepam was not effective and he had stopped using it.  He had no repercussions after stopping it.  He was advised to follow-up as needed.  He is referred by his primary care physician for reevaluation.   Today, 11/01/2020: He reports that his vivid dreams have become worse in the last few months.  Per fianc, he has had more movements and acting out in his dreams.  They typically do not sleep in the same bed together.  He snores to some degree.  He denies waking up with a gasping sensation or having apneas that are  witnessed.  He has been on an injectable medication for his HIV.  He has had intermittent tingling around his head.  He describes a zap like sensation, no actual headaches, denies any one-sided weakness or numbness or tingling or droopy face or slurring of speech otherwise.  He had a brain MRI without contrast in June 2020 which was reassuring.  He overall feels clumsy, balance is not as good but has not had any falls.  He would be willing to try clonazepam again.  He was on 0.5 mg at bedtime when he started due to not being effective.  He had also tried melatonin in the past when we first started treatment for his REM behavior disorder.      ROS:   14 system review of systems performed and negative with exception of those listed in HPI  PMH:  Past Medical History:  Diagnosis Date   Abscess    Between legs   Arthritis    all over   Asthma    Avascular necrosis of femoral head, left (HCC)    Blood transfusion without reported diagnosis    Cholelithiasis    CKD (chronic kidney disease) stage 3, GFR 30-59 ml/min (HCC)    sees kidney Dr.   [place] disorder (HCC)    left DVT   Diverticulosis    DVT (deep venous thrombosis) (HCC)    legs   Dyspnea    when walking   Dysrhythmia    remembers mother taking about having an irregular rhythm years when he was a child    GERD (gastroesophageal reflux disease)    HIV (human immunodeficiency virus infection) (HCC)    Hypertension    Morbid obesity (HCC)     PSH:  Past Surgical History:  Procedure Laterality Date   COLONOSCOPY  2019   DENTAL SURGERY     had teeth pulled   HYDRADENITIS EXCISION Left 10/14/2016   Procedure: WIDE EXCISION HIDRADENITIS LEFT AXILLA;  Surgeon: Abigail Miyamoto, MD;  Location: [facility] OR;  Service: General;  Laterality: Left;   JOINT REPLACEMENT     Left hip Dr. Magnus Ivan 11/29/16   TOTAL HIP ARTHROPLASTY Left 11/29/2016   Procedure: LEFT TOTAL HIP ARTHROPLASTY ANTERIOR APPROACH;  Surgeon: Kathryne Hitch,  MD;  Location: [facility] ORS;  Service: Orthopedics;  Laterality: Left;    Social History:  Social History   Socioeconomic History   Marital status: Single    Spouse name: Not on file   Number of children:  2   Years of education: Not on file   Highest education level: Not on file  Occupational History   Occupation: Disability  Tobacco Use   Smoking status: Never   Smokeless tobacco: Never  Vaping Use   Vaping Use: Never used  Substance and Sexual Activity   Alcohol use: No    Comment: prior   Drug use: No    Comment: prior cocaine use, last 2005   Sexual activity: Not Currently    Partners: Female    Birth control/protection: Condom    Comment: declined condoms 05/2020  Other Topics Concern   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, mailroom, warehouse work   Lives w/ fiancee   No EtOH, tobacco, drugs   Social Determinants of Health   Financial Resource Strain: Medium Risk (02/18/2022)   Overall Financial Resource Strain (CARDIA)    Difficulty of Paying Living Expenses: Somewhat hard  Food Insecurity: No Food Insecurity (04/22/2022)   Hunger Vital Sign    Worried About Running Out of Food in the Last Year: Never true    Ran Out of Food in the Last Year: Never true  Transportation Needs: No Transportation Needs (08/29/2022)   PRAPARE - Administrator, Civil Service (Medical): No    Lack of Transportation (Non-Medical): No  Physical Activity: Insufficiently Active (12/10/2017)   Exercise Vital Sign    Days of Exercise per Week: 2 days    Minutes of Exercise per Session: 10 min  Stress: No Stress Concern Present (12/10/2017)   [employer] of Occupational Health - Occupational Stress Questionnaire    Feeling of Stress : Only a little  Social Connections: Somewhat Isolated (12/10/2017)   Social Connection and Isolation Panel [NHANES]    Frequency of Communication with Friends and Family: Three times a week    Frequency of  Social Gatherings with Friends and Family: More than three times a week    Attends Religious Services: Never    Database administrator or Organizations: No    Attends Banker Meetings: Never    Marital Status: Living with partner  Intimate Partner Violence: Not At Risk (12/10/2017)   Humiliation, Afraid, Rape, and Kick questionnaire    Fear of Current or Ex-Partner: No    Emotionally Abused: No    Physically Abused: No    Sexually Abused: No    Family History:  Family History  Problem Relation Age of Onset   Heart attack Mother 19   Heart attack Father 36   Prostate cancer Brother    Bone cancer Maternal Aunt    Breast cancer Maternal Aunt    Colon cancer Neg Hx    Esophageal cancer Neg Hx    Stomach cancer Neg Hx    Rectal cancer Neg Hx    Migraines Neg Hx    Headache Neg Hx     Medications:   Current Outpatient Medications on File Prior to Visit  Medication Sig Dispense Refill   cabotegravir & rilpivirine ER (CABENUVA) 600 & 900 MG/3ML injection Inject 1 kit into the muscle every 2 (two) months. 6 mL 5   clonazePAM (KLONOPIN) 1 MG tablet Take 1 mg by mouth at bedtime. (Patient not taking: Reported on 04/22/2022)     furosemide (LASIX) 40 MG tablet Take 40 mg by mouth daily. (Patient not taking: Reported on 01/18/2022)     gabapentin (NEURONTIN) 300 MG capsule TAKE 1 CAPSULE BY MOUTH THREE  TIMES DAILY (Patient not taking: Reported on 04/22/2022) 60 capsule 0   HUMIRA PEN 80 MG/0.8ML PNKT Inject 80 mg into the skin once a week. Tuesday's     methocarbamol (ROBAXIN) 750 MG tablet Take 750 mg by mouth every 8 (eight) hours as needed for muscle spasms. (Patient not taking: Reported on 08/29/2022)     metroNIDAZOLE (FLAGYL) 250 MG tablet Take 250 mg by mouth 3 (three) times daily.     olmesartan (BENICAR) 20 MG tablet Take 1 tablet by mouth once daily 90 tablet 3   traMADol (ULTRAM) 50 MG tablet Take 50 mg by mouth 3 (three) times daily. (Patient not taking: Reported on  04/22/2022)     triamcinolone ointment (KENALOG) 0.1 % Apply 1 Application topically 2 (two) times daily.     XARELTO 20 MG TABS tablet Take 20 mg by mouth daily.     No current facility-administered medications on file prior to visit.    Allergies:   Allergies  Allergen Reactions   Sulfa Antibiotics Other (See Comments)    Low blood pressure   Vancomycin Itching    Give with benadryl      OBJECTIVE:  Physical Exam  There were no vitals filed for this visit.  There is no height or weight on file to calculate BMI. No results found.  General: well developed, well nourished, very pleasant middle-aged African-American male, seated, in no evident distress Head: head normocephalic and atraumatic.   Neck: supple with no carotid or supraclavicular bruits Cardiovascular: regular rate and rhythm, no murmurs Musculoskeletal: no deformity Skin:  no rash/petichiae Vascular:  Normal pulses all extremities   Neurologic Exam Mental Status: Awake and fully alert. Oriented to place and time. Recent and remote memory intact. Attention span, concentration and fund of knowledge appropriate. Mood and affect appropriate.  Cranial Nerves: Pupils equal, briskly reactive to light. Extraocular movements full without nystagmus. Visual fields full to confrontation. Hearing intact. Facial sensation intact. Face, tongue, palate moves normally and symmetrically.  Motor: Normal bulk and tone. Normal strength in all tested extremity muscles Sensory.: intact to touch , pinprick , position and vibratory sensation.  Coordination: Rapid alternating movements normal in all extremities. Finger-to-nose and heel-to-shin performed accurately bilaterally. Gait and Station: Arises from chair without difficulty. Stance is normal. Gait demonstrates normal stride length and balance without use of AD. Tandem walk and heel toe without difficulty.  Reflexes: 1+ and symmetric. Toes downgoing.         ASSESSMENT:  Allen Espinoza is a 62 y.o. 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 Also recent ED visit for post fall SDH.      PLAN:  OSA on CPAP : Discussed importance of increasing nightly compliance and ensuring greater than 4 hours per night.  Continue current AutoPap settings as currently managing apnea well.  Advised to continue to follow with DME company for any needed supplies or CPAP related concerns. Right-sided headaches: now resolved. 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  This included previsit chart review including review of recent hospitalization, lab review, study review, order entry, electronic health record documentation, patient education and discussion regarding above diagnoses and treatment plan and answered all other questions to patient's satisfaction   Ihor Austin, [facility]  [facility] [address] [place], [place] [address]  Phone [phone] Fax [phone] Note: This document was prepared with digital dictation and possible smart phrase technology. Any transcriptional errors that result from this process are unintentional.

## 2022-10-10 ENCOUNTER — Encounter: Payer: Self-pay | Admitting: Adult Health

## 2022-10-10 ENCOUNTER — Other Ambulatory Visit (HOSPITAL_COMMUNITY): Payer: Self-pay

## 2022-10-10 ENCOUNTER — Ambulatory Visit: Payer: Medicaid Other | Admitting: Adult Health

## 2022-10-10 ENCOUNTER — Other Ambulatory Visit: Payer: Self-pay

## 2022-10-10 VITALS — BP 154/69 | HR 47 | Ht 72.0 in | Wt 319.0 lb

## 2022-10-10 DIAGNOSIS — R2681 Unsteadiness on feet: Secondary | ICD-10-CM | POA: Diagnosis not present

## 2022-10-10 DIAGNOSIS — R269 Unspecified abnormalities of gait and mobility: Secondary | ICD-10-CM

## 2022-10-10 DIAGNOSIS — R519 Headache, unspecified: Secondary | ICD-10-CM | POA: Diagnosis not present

## 2022-10-10 DIAGNOSIS — G4733 Obstructive sleep apnea (adult) (pediatric): Secondary | ICD-10-CM

## 2022-10-10 DIAGNOSIS — Z789 Other specified health status: Secondary | ICD-10-CM | POA: Diagnosis not present

## 2022-10-10 DIAGNOSIS — S065XAA Traumatic subdural hemorrhage with loss of consciousness status unknown, initial encounter: Secondary | ICD-10-CM

## 2022-10-10 NOTE — Patient Instructions (Signed)
Your Plan:  You will be called to work with physical therapy to help with balance and unsteadiness   Please call with any worsening headaches or if right side starts to become painful     Follow up as needed or call earlier if needed     Thank you for coming to see [place] at [facility]. I hope we have been able to provide you high quality care today.  You may receive a patient satisfaction survey over the next few weeks. We would appreciate your feedback and comments so that we may continue to improve ourselves and the health of our patients.

## 2022-10-11 ENCOUNTER — Telehealth: Payer: Self-pay

## 2022-10-11 NOTE — Telephone Encounter (Signed)
[facility] Patient Advocate Encounter  Patient's medication Allen Espinoza) have been couriered to [facility] from [employer] [employer] and will be administered on the patient next office visit on 10/17/22.  Clearance Coots , CPhT Specialty Pharmacy Patient [facility] Phone: [phone] Fax:  [phone]

## 2022-10-16 NOTE — Therapy (Signed)
OUTPATIENT PHYSICAL THERAPY LOWER EXTREMITY EVALUATION   Patient Name: Allen Espinoza MRN: [id number] DOB:Feb 22, 1961, 62 y.o., male Today's Date: 10/18/2022  END OF SESSION:  PT End of Session - 10/18/22 0831     Visit Number 1    Number of Visits 5    Date for PT Re-Evaluation 12/19/22    Authorization Type MCD    PT Start Time 0745    PT Stop Time 0830    PT Time Calculation (min) 45 min    Activity Tolerance Patient tolerated treatment well    Behavior During Therapy WFL for tasks assessed/performed             Past Medical History:  Diagnosis Date   Abscess    Between legs   Arthritis    all over   Asthma    Avascular necrosis of femoral head, left (HCC)    Blood transfusion without reported diagnosis    Cholelithiasis    CKD (chronic kidney disease) stage 3, GFR 30-59 ml/min (HCC)    sees kidney Dr.   [address] disorder (HCC)    left DVT   Diverticulosis    DVT (deep venous thrombosis) (HCC)    legs   Dyspnea    when walking   Dysrhythmia    remembers mother taking about having an irregular rhythm years when he was a child    GERD (gastroesophageal reflux disease)    HIV (human immunodeficiency virus infection) (HCC)    Hypertension    Morbid obesity (HCC)    Past Surgical History:  Procedure Laterality Date   COLONOSCOPY  2019   DENTAL SURGERY     had teeth pulled   HYDRADENITIS EXCISION Left 10/14/2016   Procedure: WIDE EXCISION HIDRADENITIS LEFT AXILLA;  Surgeon: Abigail Miyamoto, MD;  Location: [facility] OR;  Service: General;  Laterality: Left;   JOINT REPLACEMENT     Left hip Dr. Magnus Ivan 11/29/16   TOTAL HIP ARTHROPLASTY Left 11/29/2016   Procedure: LEFT TOTAL HIP ARTHROPLASTY ANTERIOR APPROACH;  Surgeon: Kathryne Hitch, MD;  Location: [facility] ORS;  Service: Orthopedics;  Laterality: Left;   Patient Active Problem List   Diagnosis Date Noted   At high risk for cardiovascular disease 10/17/2022   Symptomatic bradycardia 10/17/2022   Subdural  hematoma (HCC) 11/12/2021   Asthma    GERD (gastroesophageal reflux disease)    SDH (subdural hematoma) (HCC) 10/24/2021   Multiple lipomas 03/10/2021   Healthcare maintenance 10/13/2020   Numbness 09/28/2020   Subcutaneous nodules 03/01/2019   Hypertension 10/19/2018   Sleep disorder 03/05/2018   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) 08/19/2016   Morbid obesity (HCC) 08/19/2016   Solitary pulmonary nodule 05/06/2016   Hidradenitis suppurativa 04/27/2016   Chest pain 04/26/2016   Avascular necrosis of bone of hip, left s/p TRH (HCC) 03/19/2016   HIV (human immunodeficiency virus infection) (HCC) 03/18/2016   DVT (deep venous thrombosis) (HCC) 03/18/2016   Chronic kidney disease, stage 3a (HCC) 03/18/2016   Cholelithiasis 03/18/2016   Diverticulosis of colon without hemorrhage 03/18/2016    PCP: Burton Apley, MD   REFERRING PROVIDER: Ihor Austin, NP  REFERRING DIAG: R26.9 (ICD-10-CM) - Gait disorder R26.81 (ICD-10-CM) - Unsteadiness on feet  THERAPY DIAG:  Unsteadiness on feet  Rationale for Evaluation and Treatment: Rehabilitation  ONSET DATE: chronic  SUBJECTIVE:   SUBJECTIVE STATEMENT: Describes a history of gait instability and a sensation of unable to ambulate in a straight line.  Denies history  of lightheadedness and vertigos.  PERTINENT HISTORY: He does complain of feeling off balance and unsteady on his feet, has a hard time walking a straight line, has been present since his hip replacement a few years ago. Feels like his left leg is harder to move. He recently started going to a local gym about 3 weeks ago. He has not had any recent falls, does not use AD. Will occasionally have headache if neck in odd position too long (after sleeping or watching TV in bed) but otherwise has not been having headaches. No longer taking gabapentin. Will have occasional numbness on right side of head (starts occipital and radiates up)  lasting only short duration over the past year. Denies pain associated.  PAIN:  Are you having pain? No  PRECAUTIONS: Fall  RED FLAGS: None   WEIGHT BEARING RESTRICTIONS: No  FALLS:  Has patient fallen in last 6 months? No  OCCUPATION: not working  PLOF: Independent  PATIENT GOALS: To become more steady on my feet  NEXT MD VISIT: as needed  OBJECTIVE:   DIAGNOSTIC FINDINGS: none  PATIENT SURVEYS:  FOTO TBD due to computer issues  MUSCLE LENGTH: deferred  POSTURE:  deferred  PALPATION: deferred  LOWER EXTREMITY ROM:  Active ROM Right eval Left eval  Hip flexion  85d  Hip extension  neutral  Hip abduction  WFL  Hip adduction    Hip internal rotation    Hip external rotation    Knee flexion    Knee extension    Ankle dorsiflexion    Ankle plantarflexion    Ankle inversion    Ankle eversion     (Blank rows = not tested)  LOWER EXTREMITY MMT:  MMT Right eval Left eval  Hip flexion  3  Hip extension  4-  Hip abduction  4-  Hip adduction    Hip internal rotation    Hip external rotation    Knee flexion    Knee extension    Ankle dorsiflexion    Ankle plantarflexion    Ankle inversion    Ankle eversion     (Blank rows = not tested)   FUNCTIONAL TESTS:   30s chair stand test 0 reps w/o UE support            mCTSIB 30s on position 1,2,3 unable to maintain position 4     10/18/22 0001  Dynamic Gait Index  Level Surface 3  Change in Gait Speed 3  Gait with Horizontal Head Turns 2  Gait with Vertical Head Turns 2  Gait and Pivot Turn 2  Step Over Obstacle 3  Step Around Obstacles 3  Steps 3  Total Score 21     GAIT: Distance walked: [id number] Assistive device utilized: None Level of assistance: Complete Independence Comments: decreased trunk rotation   TODAY'S TREATMENT:  DATE: 10/18/22 Eval    PATIENT  EDUCATION:  Education details: Discussed eval findings, rehab rationale and POC and patient is in agreement  Person educated: Patient Education method: Explanation Education comprehension: verbalized understanding and needs further education  HOME EXERCISE PROGRAM: TBD  ASSESSMENT:  CLINICAL IMPRESSION: Patient is a 62 y.o. male  who was seen today for physical therapy evaluation and treatment for L hip weakness and gait instability with potential BPPV.  Patient presents with mild ROM restrictions in L hip flexion as well as marked weakness with L hip flexion.  DGI score 21/24 but unable to maintain position 4 with mCTSIB.  Functional LE strength deficits noted as patient unable to arise from chair w/o UE support.   OBJECTIVE IMPAIRMENTS: Abnormal gait, decreased activity tolerance, decreased balance, decreased endurance, decreased knowledge of condition, decreased mobility, difficulty walking, decreased ROM, decreased strength, and obesity.   ACTIVITY LIMITATIONS: squatting, stairs, and transfers  PERSONAL FACTORS: Fitness, Past/current experiences, and Time since onset of injury/illness/exacerbation are also affecting patient's functional outcome.   REHAB POTENTIAL: Good  CLINICAL DECISION MAKING: Stable/uncomplicated  EVALUATION COMPLEXITY: Low   GOALS: Goals reviewed with patient? No  SHORT TERM GOALS: Target date: 11/18/22 Patient to demonstrate independence in HEP  Baseline: TBD Goal status: INITIAL  2.  Assess FOTO  Baseline: TBD Goal status: INITIAL  3.  Patient to demo 1 rep on 30s chair stand test w/o UE support Baseline: 0 reps Goal status: INITIAL  4.  Increase L hip flexion strength to 3+/5 Baseline:  MMT Right eval Left eval  Hip flexion  3  Hip extension  4-  Hip abduction  4-   Goal status: INITIAL    PLAN:  PT FREQUENCY: 1-2x/week  PT DURATION: 6 weeks  PLANNED INTERVENTIONS: Therapeutic exercises, Therapeutic activity, Neuromuscular  re-education, Balance training, Gait training, Patient/Family education, Self Care, Joint mobilization, Stair training, DME instructions, Aquatic Therapy, Dry Needling, Electrical stimulation, Cryotherapy, Moist heat, Manual therapy, and Re-evaluation  PLAN FOR NEXT SESSION: HEP review and update, manual techniques as appropriate, aerobic tasks, ROM and flexibility activities, strengthening and PREs, TPDN, gait and balance training as needed     Hildred Laser, PT 10/18/2022, 8:32 AM   Check all possible CPT codes: 04540 - PT Re-evaluation, 97110- Therapeutic Exercise, [phone]- Neuro Re-education, [phone] - Gait Training, [phone] - Manual Therapy, 97530 - Therapeutic Activities, and [phone] - Self Care    Check all conditions that are expected to impact treatment: {Conditions expected to impact treatment:Morbid obesity and Respiratory disorders   If treatment provided at initial evaluation, no treatment charged due to lack of authorization.

## 2022-10-17 ENCOUNTER — Other Ambulatory Visit: Payer: Self-pay

## 2022-10-17 ENCOUNTER — Encounter: Payer: Self-pay | Admitting: Family

## 2022-10-17 ENCOUNTER — Ambulatory Visit (INDEPENDENT_AMBULATORY_CARE_PROVIDER_SITE_OTHER): Payer: Medicaid Other | Admitting: Family

## 2022-10-17 VITALS — BP 162/82 | HR 46 | Temp 97.8°F | Ht 72.0 in | Wt 318.0 lb

## 2022-10-17 DIAGNOSIS — R001 Bradycardia, unspecified: Secondary | ICD-10-CM | POA: Insufficient documentation

## 2022-10-17 DIAGNOSIS — Z21 Asymptomatic human immunodeficiency virus [HIV] infection status: Secondary | ICD-10-CM | POA: Diagnosis not present

## 2022-10-17 DIAGNOSIS — B20 Human immunodeficiency virus [HIV] disease: Secondary | ICD-10-CM | POA: Diagnosis not present

## 2022-10-17 DIAGNOSIS — Z9189 Other specified personal risk factors, not elsewhere classified: Secondary | ICD-10-CM

## 2022-10-17 DIAGNOSIS — Z0000 Encounter for general adult medical examination without abnormal findings: Secondary | ICD-10-CM

## 2022-10-17 DIAGNOSIS — L732 Hidradenitis suppurativa: Principal | ICD-10-CM

## 2022-10-17 MED ORDER — ADALIMUMAB PEN CITRATE FREE 40 MG/0.4 ML
SUBCUTANEOUS | 11 refills | 0.00000 days | Status: CP
Start: 2022-10-17 — End: ?
  Filled 2022-10-23: qty 4, 28d supply, fill #0

## 2022-10-17 MED ORDER — CABOTEGRAVIR & RILPIVIRINE ER 600 & 900 MG/3ML IM SUER
1.0000 | Freq: Once | INTRAMUSCULAR | Status: AC
Start: 2022-10-17 — End: 2022-10-17
  Administered 2022-10-17: 1 via INTRAMUSCULAR

## 2022-10-17 MED ORDER — ROSUVASTATIN CALCIUM 10 MG PO TABS: 10.0000 mg | ORAL_TABLET | Freq: Every day | ORAL | 5 refills | Status: DC

## 2022-10-17 NOTE — Patient Instructions (Addendum)
Nice to see you.  We will check your lab work today.  Continue to take your medication daily as prescribed.  Plan for follow up in 2 months or sooner if needed with pharmacy provider and then NP in 6 months.   Have a great day and stay safe!

## 2022-10-17 NOTE — Assessment & Plan Note (Signed)
Allen Espinoza appears to have symptomatic bradycardia with some dizziness and shortness of breath. Not on any medications that I would suspect to lower heart rate. Will refer to Cardiology for further evaluation and management as indicated. Advised if symptoms worsen to seek emergent care.

## 2022-10-17 NOTE — Assessment & Plan Note (Signed)
Discussed importance of safe sexual practice and condom use. Condoms and STD testing offered.  Routine dental care up to date with dentures Routine vaccinations and colon cancer screening up to date.

## 2022-10-17 NOTE — Assessment & Plan Note (Signed)
Allen Espinoza is at increased risk for cardiovascular disease with elevated ASCVD risk. Recent HIV research from the REPRIEVE study supports start of statin medication to help reduce overall inflammation. Reviewed increased risk and is in agreement to start rosuvastatin. Discussed potential side effects of myalgia.

## 2022-10-17 NOTE — Addendum Note (Signed)
Addended by: Philippa Chester on: 10/17/2022 09:43 AM   Modules accepted: Orders

## 2022-10-17 NOTE — Progress Notes (Signed)
Brief Narrative   Patient ID: Allen Espinoza, male    DOB: 07-Mar-1961, 62 y.o.   MRN: [id number]  Mr. Salehi is a 62 y/o male with HIV disease diagnosed in 2007 with risk factor of heterosexual contact. Initial viral load and CD4 count are unavailable.Genotype with no medication resistant mutations. No history of opportunistic infection. ART history with Genvoya and Prezista (DDI with Xarelto) and Biktarvy. Now on Cabenuva.   Subjective:    Chief Complaint  Patient presents with   Follow-up    HPI:  Allen Espinoza is a 62 y.o. male with HIV disease last seen by Margarite Gouge, PharmD, CPP on 08/15/22 with well controlled virus and good adherence and tolerance to Cabenuva. Viral load was undetectable. Here today for follow up.  Mr. Jawad has been doing well since his last office visit. No adverse side effects with Cabenuva. Noted to have bradycardia on vital signs check today and has been having some shortness of breath and dizziness. Previously seen by Cardiology in 2021 for pericardial pain and has not been seen since that time. No other concerns/complaints. Condoms and STD testing offered. Reviewed vaccinations and healthcare maintenance is up to date.   Denies fevers, chills, night sweats, headaches, changes in vision, neck pain/stiffness, nausea, diarrhea, vomiting, lesions or rashes.    Allergies  Allergen Reactions   Sulfa Antibiotics Other (See Comments)    Low blood pressure   Vancomycin Itching    Give with benadryl      Outpatient Medications Prior to Visit  Medication Sig Dispense Refill   cabotegravir & rilpivirine ER (CABENUVA) 600 & 900 MG/3ML injection Inject 1 kit into the muscle every 2 (two) months. 6 mL 5   HUMIRA PEN 80 MG/0.8ML PNKT Inject 80 mg into the skin once a week. Tuesday's     metroNIDAZOLE (FLAGYL) 250 MG tablet Take 250 mg by mouth 3 (three) times daily.     olmesartan (BENICAR) 20 MG tablet Take 1 tablet by mouth once daily 90 tablet 3   triamcinolone  ointment (KENALOG) 0.1 % Apply 1 Application topically 2 (two) times daily.     XARELTO 20 MG TABS tablet Take 20 mg by mouth daily.     gabapentin (NEURONTIN) 300 MG capsule TAKE 1 CAPSULE BY MOUTH THREE TIMES DAILY (Patient not taking: Reported on 04/22/2022) 60 capsule 0   clonazePAM (KLONOPIN) 1 MG tablet Take 1 mg by mouth at bedtime. (Patient not taking: Reported on 04/22/2022)     furosemide (LASIX) 40 MG tablet Take 40 mg by mouth daily. (Patient not taking: Reported on 01/18/2022)     methocarbamol (ROBAXIN) 750 MG tablet Take 750 mg by mouth every 8 (eight) hours as needed for muscle spasms. (Patient not taking: Reported on 08/29/2022)     traMADol (ULTRAM) 50 MG tablet Take 50 mg by mouth 3 (three) times daily. (Patient not taking: Reported on 04/22/2022)     No facility-administered medications prior to visit.     Past Medical History:  Diagnosis Date   Abscess    Between legs   Arthritis    all over   Asthma    Avascular necrosis of femoral head, left (HCC)    Blood transfusion without reported diagnosis    Cholelithiasis    CKD (chronic kidney disease) stage 3, GFR 30-59 ml/min (HCC)    sees kidney Dr.   [address] disorder [facility])    left DVT   Diverticulosis    DVT (deep venous thrombosis) (  HCC)    legs   Dyspnea    when walking   Dysrhythmia    remembers mother taking about having an irregular rhythm years when he was a child    GERD (gastroesophageal reflux disease)    HIV (human immunodeficiency virus infection) (HCC)    Hypertension    Morbid obesity (HCC)      Past Surgical History:  Procedure Laterality Date   COLONOSCOPY  2019   DENTAL SURGERY     had teeth pulled   HYDRADENITIS EXCISION Left 10/14/2016   Procedure: WIDE EXCISION HIDRADENITIS LEFT AXILLA;  Surgeon: Abigail Miyamoto, MD;  Location: [facility] OR;  Service: General;  Laterality: Left;   JOINT REPLACEMENT     Left hip Dr. Magnus Ivan 11/29/16   TOTAL HIP ARTHROPLASTY Left 11/29/2016   Procedure: LEFT  TOTAL HIP ARTHROPLASTY ANTERIOR APPROACH;  Surgeon: Kathryne Hitch, MD;  Location: [facility] ORS;  Service: Orthopedics;  Laterality: Left;      Review of Systems  Constitutional:  Negative for appetite change, chills, fatigue, fever and unexpected weight change.  Eyes:  Negative for visual disturbance.  Respiratory:  Negative for cough, chest tightness, shortness of breath and wheezing.   Cardiovascular:  Negative for chest pain and leg swelling.  Gastrointestinal:  Negative for abdominal pain, constipation, diarrhea, nausea and vomiting.  Genitourinary:  Negative for dysuria, flank pain, frequency, genital sores, hematuria and urgency.  Skin:  Negative for rash.  Allergic/Immunologic: Negative for immunocompromised state.  Neurological:  Negative for dizziness and headaches.      Objective:    BP (!) 162/82   Pulse (!) 46   Temp 97.8 F (36.6 C) (Temporal)   Ht 6' (1.829 m)   Wt (!) 318 lb (144.2 kg)   SpO2 98%   BMI 43.13 kg/m  Nursing note and vital signs reviewed.  Physical Exam Constitutional:      General: He is not in acute distress.    Appearance: He is well-developed.  Eyes:     Conjunctiva/sclera: Conjunctivae normal.  Cardiovascular:     Rate and Rhythm: Regular rhythm. Bradycardia present.     Heart sounds: Normal heart sounds. No murmur heard.    No friction rub. No gallop.  Pulmonary:     Effort: Pulmonary effort is normal. No respiratory distress.     Breath sounds: Normal breath sounds. No wheezing or rales.  Chest:     Chest wall: No tenderness.  Abdominal:     General: Bowel sounds are normal.     Palpations: Abdomen is soft.     Tenderness: There is no abdominal tenderness.  Musculoskeletal:     Cervical back: Neck supple.  Lymphadenopathy:     Cervical: No cervical adenopathy.  Skin:    General: Skin is warm and dry.     Findings: No rash.  Neurological:     Mental Status: He is alert and oriented to person, place, and time.   Psychiatric:        Behavior: Behavior normal.        Thought Content: Thought content normal.        Judgment: Judgment normal.         10/17/2022    8:28 AM 04/25/2022    9:44 AM 01/10/2022   10:16 AM 12/12/2021    8:32 AM 06/21/2021    9:47 AM  Depression screen PHQ 2/9  Decreased Interest 0 0 0 0 0  Down, Depressed, Hopeless 0 0 0 0 0  PHQ -  2 Score 0 0 0 0 0       Assessment & Plan:    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) 08/19/2016   Morbid obesity (HCC) 08/19/2016   Solitary pulmonary nodule 05/06/2016   Hidradenitis suppurativa 04/27/2016   Chest pain 04/26/2016   Avascular necrosis of bone of hip, left s/p TRH (HCC) 03/19/2016   HIV (human immunodeficiency virus infection) (HCC) 03/18/2016   DVT (deep venous thrombosis) (HCC) 03/18/2016   Chronic kidney disease, stage 3a (HCC) 03/18/2016   Cholelithiasis 03/18/2016   Diverticulosis of colon without hemorrhage 03/18/2016     Problem List Items Addressed This Visit       Other   HIV (human immunodeficiency virus infection) (HCC) - Primary    Mr. Hashem continues to have well controlled with good adherence and tolerance to [place].  Reviewed previous lab work and discussed plan of care. Cabenuva injection provided with no adverse side effects.  Check blood work.  Plan for follow-up in 2 months for next injection and with me in 6 months or sooner if needed with lab work on the same day.      Relevant Orders   COMPLETE  METABOLIC PANEL WITH GFR   HIV-1 RNA quant-no reflex-bld   T-helper cell (CD4)- ([facility] clinic only)   Healthcare maintenance    Discussed importance of safe sexual practice and condom use. Condoms and STD testing offered.  Routine dental care up to date with dentures Routine vaccinations and colon cancer screening up to date.       At high risk for cardiovascular disease    Mr. Bastin is at increased risk for cardiovascular disease with elevated ASCVD risk. Recent HIV research from the REPRIEVE study supports start of statin medication to help reduce overall inflammation. Reviewed increased risk and is in agreement to start rosuvastatin. Discussed potential side effects of myalgia.       Symptomatic bradycardia    Mr. Chill appears to have symptomatic bradycardia with some dizziness and shortness of breath. Not on any medications that I would suspect to lower heart rate. Will refer to Cardiology for further evaluation and management as indicated. Advised if symptoms worsen to seek emergent care.       Relevant Orders   Ambulatory referral to Cardiology     I have discontinued Donnell D. Mendizabal's furosemide, clonazePAM, traMADol, and methocarbamol. I am also having him start on rosuvastatin. Additionally, I am having him maintain his Humira (2 Pen), olmesartan, Xarelto, gabapentin, metroNIDAZOLE, cabotegravir & rilpivirine ER, and triamcinolone ointment.   Meds ordered this encounter  Medications   rosuvastatin (CRESTOR) 10 MG tablet    Sig: Take 1 tablet (10 mg total) by mouth daily.    Dispense:  30 tablet    Refill:  5    Order Specific Question:   Supervising Provider    Answer:   Judyann Munson [[id number]]     Follow-up: Return in about 2 months (around 12/18/2022), or if symptoms worsen or fail to improve.  Marcos Eke, MSN, FNP-C Nurse Practitioner [facility] Main number: [phone]

## 2022-10-17 NOTE — Assessment & Plan Note (Signed)
Allen Espinoza continues to have well controlled with good adherence and tolerance to [place].  Reviewed previous lab work and discussed plan of care. Cabenuva injection provided with no adverse side effects.  Check blood work.  Plan for follow-up in 2 months for next injection and with me in 6 months or sooner if needed with lab work on the same day.

## 2022-10-18 ENCOUNTER — Ambulatory Visit: Payer: Medicaid Other | Attending: Adult Health

## 2022-10-18 ENCOUNTER — Other Ambulatory Visit: Payer: Self-pay

## 2022-10-18 DIAGNOSIS — M6281 Muscle weakness (generalized): Secondary | ICD-10-CM | POA: Insufficient documentation

## 2022-10-18 DIAGNOSIS — R269 Unspecified abnormalities of gait and mobility: Secondary | ICD-10-CM | POA: Diagnosis not present

## 2022-10-18 DIAGNOSIS — R2681 Unsteadiness on feet: Secondary | ICD-10-CM | POA: Insufficient documentation

## 2022-10-18 DIAGNOSIS — L732 Hidradenitis suppurativa: Principal | ICD-10-CM

## 2022-10-18 LAB — COMPLETE METABOLIC PANEL WITH GFR
ALT: 12 U/L (ref 9–46)
AST: 16 U/L (ref 10–35)
BUN/Creatinine Ratio: 8 (calc) (ref 6–22)
CO2: 28 mmol/L (ref 20–32)
Calcium: 9.3 mg/dL (ref 8.6–10.3)
Chloride: 106 mmol/L (ref 98–110)
Creat: 1.38 mg/dL — ABNORMAL HIGH (ref 0.70–1.35)
Globulin: 3.8 g/dL (calc) — ABNORMAL HIGH (ref 1.9–3.7)
Potassium: 4.5 mmol/L (ref 3.5–5.3)
Total Protein: 7.5 g/dL (ref 6.1–8.1)

## 2022-10-18 NOTE — Unmapped (Signed)
Good morning Dr. Janyth Contes,    I spoke with Dustin Prince this morning who stated he has been without his humira medication in the last two months. He stated that he spoke to [employer] who informed him if he had less of the amount that was ordered then they will pay for it. He is stated that he is having a really bad breakout and skin is not doing well.    Thank you,    [name]

## 2022-10-20 LAB — COMPLETE METABOLIC PANEL WITH GFR
AG Ratio: 1 (calc) (ref 1.0–2.5)
Albumin: 3.7 g/dL (ref 3.6–5.1)
Alkaline phosphatase (APISO): 66 U/L (ref 35–144)
BUN: 11 mg/dL (ref 7–25)
Glucose, Bld: 104 mg/dL — ABNORMAL HIGH (ref 65–99)
Sodium: 141 mmol/L (ref 135–146)
Total Bilirubin: 0.7 mg/dL (ref 0.2–1.2)
eGFR: 57 mL/min/{1.73_m2} — ABNORMAL LOW (ref >=60)

## 2022-10-20 LAB — HIV-1 RNA QUANT-NO REFLEX-BLD
HIV 1 RNA Quant: NOT DETECTED Copies/mL
HIV-1 RNA Quant, Log: NOT DETECTED Log cps/mL

## 2022-10-21 ENCOUNTER — Ambulatory Visit: Payer: Medicaid Other | Attending: Internal Medicine | Admitting: Internal Medicine

## 2022-10-21 ENCOUNTER — Encounter: Payer: Self-pay | Admitting: Internal Medicine

## 2022-10-21 VITALS — BP 150/86 | HR 46 | Ht 72.0 in | Wt 320.2 lb

## 2022-10-21 DIAGNOSIS — R001 Bradycardia, unspecified: Secondary | ICD-10-CM | POA: Diagnosis not present

## 2022-10-21 LAB — T-HELPER CELL (CD4) - (RCID CLINIC ONLY)
CD4 % Helper T Cell: 23 % — ABNORMAL LOW (ref 33–65)
CD4 T Cell Abs: 441 /uL (ref 400–1790)

## 2022-10-21 MED ORDER — AMLODIPINE BESYLATE 10 MG PO TABS: 10.0000 mg | ORAL_TABLET | Freq: Every day | ORAL | 3 refills | Status: DC

## 2022-10-21 NOTE — Patient Instructions (Addendum)
Medication Instructions:  Your physician has recommended you make the following change in your medication:  START: Amlodipine (Norvasc) 10 mg once daily *If you need a refill on your cardiac medications before your next appointment, please call your pharmacy*   Lab Work: None   Testing/Procedures: None   Follow-Up: At [facility], you and your health needs are our priority.  As part of our continuing mission to provide you with exceptional heart care, we have created designated Provider Care Teams.  These Care Teams include your primary Cardiologist (physician) and Advanced Practice Providers (APPs -  Physician Assistants and Nurse Practitioners) who all work together to provide you with the care you need, when you need it.   Your next appointment:    As needed  Provider:   Maisie Fus, MD

## 2022-10-21 NOTE — Progress Notes (Unsigned)
Cardiology Office Note:    Date:  10/21/2022   ID:  Allen Espinoza, DOB 1960-09-17, MRN [id number]  PCP:  Allen Apley, MD   [facility] Providers Cardiologist:  Allen Carrow, MD { Click to update primary MD,subspecialty MD or APP then REFRESH:1}    Referring MD: Allen Speak, FNP   No chief complaint on file. Bradycardia  History of Present Illness:    Allen Espinoza is a 62 y.o. male with a hx of CKD stage 3, asthma, DVT, HIV, obesity, referral for dizzines with HR 46 bpm.    *** The patient presents with a chief complaint of intermittent dizziness and elevated blood pressure for approximately one year. They report experiencing dizziness off and on, but not constantly. The patient has a history of falling down the steps while carrying a grocery cart, during which they hit their head but did not lose consciousness. They recall the events surrounding the fall and do not believe it was related to dizziness or passing out.  The patient has a history of hypertension and is currently taking olmesartan for blood pressure management. They do not regularly monitor their blood pressure at home but report that it has been elevated recently. The patient denies any known heart problems, heart catheterizations, or surgeries.  **TTE in 2020   Tested, no sleep apnea He denies angina, dyspnea on exertion, lower extremity edema, PND or orthopnea.   The 10-year ASCVD risk score (Arnett DK, et al., 2019) is: 17.2%   Values used to calculate the score:     Age: 43 years     Sex: Male     Is Non-Hispanic African American: Yes     Diabetic: No     Tobacco smoker: No     Systolic Blood Pressure: 150 mmHg     Is BP treated: Yes     HDL Cholesterol: 67 mg/dL     Total Cholesterol: 161 mg/dL    Past Medical History:  Diagnosis Date   Abscess    Between legs   Arthritis    all over   Asthma    Avascular necrosis of femoral head, left (HCC)    Blood transfusion  without reported diagnosis    Cholelithiasis    CKD (chronic kidney disease) stage 3, GFR 30-59 ml/min (HCC)    sees kidney Dr.   [address] disorder (HCC)    left DVT   Diverticulosis    DVT (deep venous thrombosis) (HCC)    legs   Dyspnea    when walking   Dysrhythmia    remembers mother taking about having an irregular rhythm years when he was a child    GERD (gastroesophageal reflux disease)    HIV (human immunodeficiency virus infection) (HCC)    Hypertension    Morbid obesity (HCC)     Past Surgical History:  Procedure Laterality Date   COLONOSCOPY  2019   DENTAL SURGERY     had teeth pulled   HYDRADENITIS EXCISION Left 10/14/2016   Procedure: WIDE EXCISION HIDRADENITIS LEFT AXILLA;  Surgeon: Abigail Miyamoto, MD;  Location[facility];  Service: General;  Laterality: Left;   JOINT REPLACEMENT     Left hip Dr. Magnus Ivan 11/29/16   TOTAL HIP ARTHROPLASTY Left 11/29/2016   Procedure: LEFT TOTAL HIP ARTHROPLASTY ANTERIOR APPROACH;  Surgeon: Kathryne Hitch, MD;  Location: [facility] ORS;  Service: Orthopedics;  Laterality: Left;    Current Medications: No outpatient medications have been marked as taking for the 10/21/22  encounter (Appointment) with Maisie Fus, MD.     Allergies:   Sulfa antibiotics and Vancomycin   Social History   Socioeconomic History   Marital status: Single    Spouse name: Not on file   Number of children: 2   Years of education: Not on file   Highest education level: Not on file  Occupational History   Occupation: Disability  Tobacco Use   Smoking status: Never   Smokeless tobacco: Never  Vaping Use   Vaping status: Never Used  Substance and Sexual Activity   Alcohol use: No    Comment: prior   Drug use: No    Comment: prior cocaine use, last 2005   Sexual activity: Not Currently    Partners: Female    Birth control/protection: Condom    Comment: declined condoms 05/2020  Other Topics Concern   Not on file  Social History Narrative    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, mailroom, warehouse work   Lives w/ fiancee   No EtOH, tobacco, drugs   Social Determinants of Health   Financial Resource Strain: Medium Risk (02/18/2022)   Overall Financial Resource Strain (CARDIA)    Difficulty of Paying Living Expenses: Somewhat hard  Food Insecurity: No Food Insecurity (04/22/2022)   Hunger Vital Sign    Worried About Running Out of Food in the Last Year: Never true    Ran Out of Food in the Last Year: Never true  Transportation Needs: No Transportation Needs (08/29/2022)   PRAPARE - Administrator, Civil Service (Medical): No    Lack of Transportation (Non-Medical): No  Physical Activity: Insufficiently Active (12/10/2017)   Exercise Vital Sign    Days of Exercise per Week: 2 days    Minutes of Exercise per Session: 10 min  Stress: No Stress Concern Present (12/10/2017)   [employer] of Occupational Health - Occupational Stress Questionnaire    Feeling of Stress : Only a little  Social Connections: Somewhat Isolated (12/10/2017)   Social Connection and Isolation Panel [NHANES]    Frequency of Communication with Friends and Family: Three times a week    Frequency of Social Gatherings with Friends and Family: More than three times a week    Attends Religious Services: Never    Database administrator or Organizations: No    Attends Engineer, structural: Never    Marital Status: Living with partner     Family History: The patient's ***family history includes Bone cancer in his maternal aunt; Breast cancer in his maternal aunt; Heart attack (age of onset: 72) in his father and mother; Prostate cancer in his brother. There is no history of Colon cancer, Esophageal cancer, Stomach cancer, Rectal cancer, Migraines, or Headache.  ROS:   Please see the history of present illness.    *** All other systems reviewed and are negative.  EKGs/Labs/Other Studies Reviewed:    The following studies were reviewed  today:   TTE 2020***      Recent Labs: 11/13/2021: Hemoglobin 12.9; Platelets 129 10/17/2022: ALT 12; BUN 11; Creat 1.38; Potassium 4.5; Sodium 141   Recent Lipid Panel    Component Value Date/Time   CHOL 161 04/25/2022 0946   CHOL 150 03/09/2021 1017   TRIG 65 04/25/2022 0946   HDL 67 04/25/2022 0946   HDL 65 03/09/2021 1017   CHOLHDL 2.4 04/25/2022 0946   VLDL 16 08/07/2016 1004   LDLCALC 80 04/25/2022 0946     Risk Assessment/Calculations:   {  Does this patient have ATRIAL FIBRILLATION?:[phone]}  No BP recorded.  {Refresh Note OR Click here to enter BP  :1}***         Physical Exam:    VS:  There were no vitals taken for this visit.    Wt Readings from Last 3 Encounters:  10/17/22 (!) 318 lb (144.2 kg)  10/10/22 (!) 319 lb (144.7 kg)  04/25/22 (!) 321 lb (145.6 kg)     GEN: *** Well nourished, well developed in no acute distress HEENT: Normal NECK: No JVD; No carotid bruits LYMPHATICS: No lymphadenopathy CARDIAC: ***RRR, no murmurs, rubs, gallops RESPIRATORY:  Clear to auscultation without rales, wheezing or rhonchi  ABDOMEN: Soft, non-tender, non-distended MUSCULOSKELETAL:  No edema; No deformity  SKIN: Warm and dry NEUROLOGIC:  Alert and oriented x 3 PSYCHIATRIC:  Normal affect   ASSESSMENT:   Sinus bradycardia - HR increased to 56 bpm with walking, asymptomatic, No signs of chronotropic incompetence. -He's placed sports his whole life , can be an athletic heart rate. - avoid AVN blocking in the future  HTN -continue olmesartan 20 mg daily - start additional antihypertensive   PLAN:    In order of problems listed above:  Start norvasc 5 mg daily Follow up PRN      {Are you ordering a CV Procedure (e.g. stress test, cath, DCCV, TEE, etc)?   Press F2        :[id number]}    Medication Adjustments/Labs and Tests Ordered: Current medicines are reviewed at length with the patient today.  Concerns regarding medicines are outlined above.  No  orders of the defined types were placed in this encounter.  No orders of the defined types were placed in this encounter.   There are no Patient Instructions on file for this visit.   Signed, Maisie Fus, MD  10/21/2022 8:57 AM    [facility]

## 2022-10-21 NOTE — Unmapped (Signed)
Returned call to patient after he LVM on nurse line.  Patient states since without Humira for 2 months now he is having major flare up of HS between legs and groin area.    Patient also wanted status of Humira .  Informed him that per Marvene Staff at [facility], the 40mg  per week dose of Humira has been approved. Aundra Millet is planning on calling him today to update him .  Patient very appreciative of call back.

## 2022-10-22 NOTE — Unmapped (Signed)
[facility] Specialty Medication Onboarding    Specialty Medication: HUMIRA(CF) PEN 40 mg/0.4 mL injection (adalimumab)  Prior Authorization: Approved   Financial Assistance: No - copay  <$25  Final Copay/Day Supply: $4 / 28 days    Insurance Restrictions: None     Notes to Pharmacist:   Credit Card on File: no    The triage team has completed the benefits investigation and has determined that the patient is able to fill this medication at [facility]. Please contact the patient to complete the onboarding or follow up with the prescribing physician as needed.

## 2022-10-22 NOTE — Unmapped (Signed)
[facility]   Patient Onboarding/Medication Counseling    Dustin Prince is a 62 y.o. male with HS who I am counseling today on continuation of therapy.  I am speaking to the patient.    Was a Nurse, learning disability used for this call? No    Verified patient's date of birth / HIPAA.    Specialty medication(s) to be sent: Inflammatory Disorders: Humira      Non-specialty medications/supplies to be sent: N/A      Medications not needed at this time: N/A         Humira (adalimumab)    Medication & Administration     Dosage: Hidradenitis supperativa: Inject 80mg  under the skin on day 1, 80mg  on day 2, 80mg  on day 15, then 40mg  every 7 days starting on day 29    Lab tests required prior to treatment initiation:  Tuberculosis: Tuberculosis screening resulted in a non-reactive Quantiferon TB Gold assay.05/05/2018  Hepatitis B: Hepatitis B serology studies are complete and non-reactive. 05/19/2017    Administration:     Prefilled auto-injector pen  1. Gather all supplies needed for injection on a clean, flat working surface: medication pen removed from packaging, alcohol swab, sharps container, etc.  2. Look at the medication label - look for correct medication, correct dose, and check the expiration date  3. Look at the medication - the liquid visible in the window on the side of the pen device should appear clear and colorless  4. Lay the auto-injector pen on a flat surface and allow it to warm up to room temperature for at least 30-45 minutes  5. Select injection site - you can use the front of your thigh or your belly (but not the area 2 inches around your belly button); if someone else is giving you the injection you can also use your upper arm in the skin covering your triceps muscle  6. Prepare injection site - wash your hands and clean the skin at the injection site with an alcohol swab and let it air dry, do not touch the injection site again before the injection  7. Pull the 2 safety caps straight off - gray/white to uncover the needle cover and the plum cap to uncover the plum activator button, do not remove until immediately prior to injection and do not touch the white needle cover  8. Gently squeeze the area of cleaned skin and hold it firmly to create a firm surface at the selected injection site  9. Put the white needle cover against your skin at the injection site at a 90 degree angle, hold the pen such that you can see the clear medication window  10. Press down and hold the pen firmly against your skin, press the plum activator button to initiate the injection, there will be a click when the injection starts  11. Continue to hold the pen firmly against your skin for about 10-15 seconds - the window will start to turn solid yellow  12. To verify the injection is complete after 10-15 seconds, look and ensure the window is solid yellow and then pull the pen away from your skin  13. Dispose of the used auto-injector pen immediately in your sharps disposal container the needle will be covered automatically  14. If you see any blood at the injection site, press a cotton ball or gauze on the site and maintain pressure until the bleeding stops, do not rub the injection site    Adherence/Missed dose instructions:  If your injection is given more than 3 days after your scheduled injection date - consult your pharmacist for additional instructions on how to adjust your dosing schedule.    Goals of Therapy     - Reduce the frequency and severity of new lesions  - Minimize pain and suppuration  - Prevent disease progression and limit scarring  - Maintenance of effective psychosocial functioning    Side Effects & Monitoring Parameters     Injection site reaction (redness, irritation, inflammation localized to the site of administration)  Signs of a common cold - minor sore throat, runny or stuffy nose, etc.  Upset stomach  Headache    The following side effects should be reported to the provider:  Signs of a hypersensitivity reaction - rash; hives; itching; red, swollen, blistered, or peeling skin; wheezing; tightness in the chest or throat; difficulty breathing, swallowing, or talking; swelling of the mouth, face, lips, tongue, or throat; etc.  Reduced immune function - report signs of infection such as fever; chills; body aches; very bad sore throat; ear or sinus pain; cough; more sputum or change in color of sputum; pain with passing urine; wound that will not heal, etc.  Also at a slightly higher risk of some malignancies (mainly skin and blood cancers) due to this reduced immune function.  In the case of signs of infection - the patient should hold the next dose of Humira?? and call your primary care provider to ensure adequate medical care.  Treatment may be resumed when infection is treated and patient is asymptomatic.  Changes in skin - a new growth or lump that forms; changes in shape, size, or color of a previous mole or marking  Signs of unexplained bruising or bleeding - throwing up blood or emesis that looks like coffee grounds; black, tarry, or bloody stool; etc.  Signs of new or worsening heart failure - shortness of breath; sudden weight gain; heartbeat that is not normal; swelling in the arms or legs that is new or worse      Contraindications, Warnings, & Precautions     Have your bloodwork checked as you have been told by your prescriber  Talk with your doctor if you are pregnant, planning to become pregnant, or breastfeeding  Discuss the possible need for holding your dose(s) of Humira?? when a planned procedure is scheduled with the prescriber as it may delay healing/recovery timeline       Drug/Food Interactions     Medication list reviewed in Epic. The patient was instructed to inform the care team before taking any new medications or supplements. No drug interactions identified.   Talk with you prescriber or pharmacist before receiving any live vaccinations while taking this medication and after you stop taking it    Storage, Handling Precautions, & Disposal     Store this medication in the refrigerator.  Do not freeze  If needed, you may store at room temperature for up to 14 days  Store in original packaging, protected from light  Do not shake  Dispose of used syringes/pens in a sharps disposal container          Current Medications (including OTC/herbals), Comorbidities and Allergies     Current Outpatient Medications   Medication Sig Dispense Refill    adalimumab (HUMIRA,CF, PEN) 80 mg/0.8 mL PnKt Inject the contents of 1 pen (80 mg total) under the skin every seven (7) days. 12 each 3    ADALIMUMAB PEN CITRATE FREE 40 MG/0.4 ML Inject  the contents of 1 pen (40 mg total) under the skin once a week. 4 each 11    albuterol HFA 90 mcg/actuation inhaler Inhale 2 puffs.      bictegrav-emtricit-tenofov ala (BIKTARVY) 50-200-25 mg tablet Take 1 tablet by mouth daily.      cabotegravir-rilpivirine (CABENUVA) 600 mg-900 mg/3 mL extended-release injection Inject 1 kit into the muscle.      CLONAZEPAM ORAL Take by mouth.      cyclobenzaprine (FLEXERIL) 5 MG tablet TAKE 1 TABLET BY MOUTH AT BEDTIME      furosemide (LASIX) 20 MG tablet Take 20 mg by mouth daily.      HYDROcodone-acetaminophen (NORCO) 5-325 mg per tablet Take 1-2 tablets every 6 hours as needed for pain 15 tablet 0    losartan (COZAAR) 50 MG tablet       methocarbamol (ROBAXIN) 750 MG tablet Take 1 tablet (750 mg total) by mouth.      metroNIDAZOLE (FLAGYL) 250 MG tablet TAKE 1 TABLET BY MOUTH THREE TIMES DAILY 90 tablet 2    pantoprazole (PROTONIX) 40 MG tablet Take 40 mg by mouth daily.      rivaroxaban (XARELTO) 20 mg tablet Take 20 mg by mouth daily with evening meal.      triamcinolone (KENALOG) 0.1 % ointment Apply topically two (2) times a day. To affected area(s) as needed for dermatitis in the gluteal cleft 454 g 2     No current facility-administered medications for this visit.       Allergies   Allergen Reactions    Sulfasalazine Other reaction(s): Other (See Comments)  Low blood pressure    Vancomycin Itching     Give with benadryl       Patient Active Problem List   Diagnosis    Hidradenitis suppurativa    HIV (human immunodeficiency virus infection) (CMS-HCC)       Reviewed and up to date in Epic.    Appropriateness of Therapy     Acute infections noted within Epic:  No active infections  Patient reported infection: None    Is medication and dose appropriate based on diagnosis and infection status? Yes    Prescription has been clinically reviewed: Yes      Baseline Quality of Life Assessment      How many days over the past month did your HS  keep you from your normal activities? For example, brushing your teeth or getting up in the morning. Patient declined to answer    Financial Information     Medication Assistance provided: Prior Authorization    Anticipated copay of $4 reviewed with patient. Verified delivery address.    Delivery Information     Scheduled delivery date: 10/24/2022    Expected start date: 10/24/2022      Medication will be delivered via UPS to the prescription address in [facility].  This shipment will not require a signature.      Explained the services we provide at [facility] and that each month we would call to set up refills.  Stressed importance of returning phone calls so that we could ensure they receive their medications in time each month.  Informed patient that we should be setting up refills 7-10 days prior to when they will run out of medication.  A pharmacist will reach out to perform a clinical assessment periodically.  Informed patient that a welcome packet, containing information about our pharmacy and other support services, a Notice of Privacy Practices, and  a drug information handout will be sent.      The patient or caregiver noted above participated in the development of this care plan and knows that they can request review of or adjustments to the care plan at any time. Patient or caregiver verbalized understanding of the above information as well as how to contact the pharmacy at [phone] option 4 with any questions/concerns.  The pharmacy is open Monday through Friday 8:30am-4:30pm.  A pharmacist is available 24/7 via pager to answer any clinical questions they may have.    Patient Specific Needs     Does the patient have any physical, cognitive, or cultural barriers? No    Does the patient have adequate living arrangements? (i.e. the ability to store and take their medication appropriately) Yes    Did you identify any home environmental safety or security hazards? No    Patient prefers to have medications discussed with  Patient     Is the patient or caregiver able to read and understand education materials at a high school level or above? Yes    Patient's primary language is  English     Is the patient high risk? No    SOCIAL DETERMINANTS OF HEALTH     At the [facility], we have learned that life circumstances - like trouble affording food, housing, utilities, or transportation can affect the health of many of our patients.   That is why we wanted to ask: are you currently experiencing any life circumstances that are negatively impacting your health and/or quality of life? Patient declined to answer    Social Determinants of Health     Financial Resource Strain: Medium Risk (02/18/2022)    Received from [facility]    Overall Financial Resource Strain (CARDIA)     Difficulty of Paying Living Expenses: Somewhat hard   Internet Connectivity: Not on file   Food Insecurity: No Food Insecurity (04/22/2022)    Received from [facility]    Hunger Vital Sign     Worried About Running Out of Food in the Last Year: Never true     Ran Out of Food in the Last Year: Never true   Tobacco Use: Low Risk  (06/03/2022)    Patient History     Smoking Tobacco Use: Never     Smokeless Tobacco Use: Never     Passive Exposure: Not on file   Housing/Utilities: Not on file   Alcohol Use: Not on file   Transportation Needs: No Transportation Needs (04/22/2022)    Received from [facility] - Transportation     Lack of Transportation (Medical): No     Lack of Transportation (Non-Medical): No   Substance Use: Not on file   Health Literacy: Not on file   Physical Activity: Insufficiently Active (12/10/2017)    Received from [facility]    Exercise Vital Sign     Days of Exercise per Week: 2 days     Minutes of Exercise per Session: 10 min   Interpersonal Safety: Unknown (10/22/2022)    Interpersonal Safety     Unsafe Where You Currently Live: Not on file     Physically Hurt by Anyone: Not on file     Abused by Anyone: Not on file   Stress: No Stress Concern Present (12/10/2017)    Received from [facility] of Occupational Health - Occupational Stress Questionnaire     Feeling of Stress : Only  a little   Intimate Partner Violence: Not At Risk (12/10/2017)    Received from [facility]    Humiliation, Afraid, Rape, and Kick questionnaire     Fear of Current or Ex-Partner: No     Emotionally Abused: No     Physically Abused: No     Sexually Abused: No   Depression: Not on file   Social Connections: Somewhat Isolated (12/10/2017)    Received from [facility]    Social Connection and Isolation Panel [NHANES]     Frequency of Communication with Friends and Family: Three times a week     Frequency of Social Gatherings with Friends and Family: More than three times a week     Attends Religious Services: Never     Database administrator or Organizations: No     Attends Banker Meetings: Never     Marital Status: Living with partner       Would you be willing to receive help with any of the needs that you have identified today? Not applicable       Elnora Morrison, PharmD  [facility] Specialty Pharmacist

## 2022-10-28 ENCOUNTER — Ambulatory Visit: Payer: Medicaid Other

## 2022-10-28 DIAGNOSIS — M6281 Muscle weakness (generalized): Secondary | ICD-10-CM

## 2022-10-28 DIAGNOSIS — R2681 Unsteadiness on feet: Secondary | ICD-10-CM | POA: Diagnosis not present

## 2022-10-28 DIAGNOSIS — R269 Unspecified abnormalities of gait and mobility: Secondary | ICD-10-CM | POA: Diagnosis not present

## 2022-10-28 NOTE — Therapy (Signed)
OUTPATIENT PHYSICAL THERAPY TREATMENT NOTE   Patient Name: Allen Espinoza MRN: [id number] DOB:01-30-1961, 62 y.o., male Today's Date: 10/28/2022  END OF SESSION:  PT End of Session - 10/28/22 0924     Visit Number 2    Number of Visits 5    Date for PT Re-Evaluation 12/19/22    Authorization Type MCD    PT Start Time 0920    PT Stop Time 1000    PT Time Calculation (min) 40 min    Activity Tolerance Patient tolerated treatment well    Behavior During Therapy WFL for tasks assessed/performed              Past Medical History:  Diagnosis Date   Abscess    Between legs   Arthritis    all over   Asthma    Avascular necrosis of femoral head, left (HCC)    Blood transfusion without reported diagnosis    Cholelithiasis    CKD (chronic kidney disease) stage 3, GFR 30-59 ml/min (HCC)    sees kidney Dr.   [place] disorder (HCC)    left DVT   Diverticulosis    DVT (deep venous thrombosis) (HCC)    legs   Dyspnea    when walking   Dysrhythmia    remembers mother taking about having an irregular rhythm years when he was a child    GERD (gastroesophageal reflux disease)    HIV (human immunodeficiency virus infection) (HCC)    Hypertension    Morbid obesity (HCC)    Past Surgical History:  Procedure Laterality Date   COLONOSCOPY  2019   DENTAL SURGERY     had teeth pulled   HYDRADENITIS EXCISION Left 10/14/2016   Procedure: WIDE EXCISION HIDRADENITIS LEFT AXILLA;  Surgeon: Abigail Miyamoto, MD;  Location: [facility] OR;  Service: General;  Laterality: Left;   JOINT REPLACEMENT     Left hip Dr. Magnus Ivan 11/29/16   TOTAL HIP ARTHROPLASTY Left 11/29/2016   Procedure: LEFT TOTAL HIP ARTHROPLASTY ANTERIOR APPROACH;  Surgeon: Kathryne Hitch, MD;  Location: [facility] ORS;  Service: Orthopedics;  Laterality: Left;   Patient Active Problem List   Diagnosis Date Noted   At high risk for cardiovascular disease 10/17/2022   Symptomatic bradycardia 10/17/2022   Subdural hematoma  (HCC) 11/12/2021   Asthma    GERD (gastroesophageal reflux disease)    SDH (subdural hematoma) (HCC) 10/24/2021   Multiple lipomas 03/10/2021   Healthcare maintenance 10/13/2020   Numbness 09/28/2020   Subcutaneous nodules 03/01/2019   Hypertension 10/19/2018   Sleep disorder 03/05/2018   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) 08/19/2016   Morbid obesity (HCC) 08/19/2016   Solitary pulmonary nodule 05/06/2016   Hidradenitis suppurativa 04/27/2016   Chest pain 04/26/2016   Avascular necrosis of bone of hip, left s/p TRH (HCC) 03/19/2016   HIV (human immunodeficiency virus infection) (HCC) 03/18/2016   DVT (deep venous thrombosis) (HCC) 03/18/2016   Chronic kidney disease, stage 3a (HCC) 03/18/2016   Cholelithiasis 03/18/2016   Diverticulosis of colon without hemorrhage 03/18/2016    PCP: Burton Apley, MD   REFERRING PROVIDER: Ihor Austin, NP  REFERRING DIAG: R26.9 (ICD-10-CM) - Gait disorder R26.81 (ICD-10-CM) - Unsteadiness on feet  THERAPY DIAG:  Unsteadiness on feet  Muscle weakness (generalized)  Rationale for Evaluation and Treatment: Rehabilitation  ONSET DATE: chronic  SUBJECTIVE:   SUBJECTIVE STATEMENT: No changes since last session  PERTINENT HISTORY: He does complain of feeling off balance and  unsteady on his feet, has a hard time walking a straight line, has been present since his hip replacement a few years ago. Feels like his left leg is harder to move. He recently started going to a local gym about 3 weeks ago. He has not had any recent falls, does not use AD. Will occasionally have headache if neck in odd position too long (after sleeping or watching TV in bed) but otherwise has not been having headaches. No longer taking gabapentin. Will have occasional numbness on right side of head (starts occipital and radiates up) lasting only short duration over the past year. Denies pain associated.  PAIN:  Are you  having pain? No  PRECAUTIONS: Fall  RED FLAGS: None   WEIGHT BEARING RESTRICTIONS: No  FALLS:  Has patient fallen in last 6 months? No  OCCUPATION: not working  PLOF: Independent  PATIENT GOALS: To become more steady on my feet  NEXT MD VISIT: as needed  OBJECTIVE:   DIAGNOSTIC FINDINGS: none  PATIENT SURVEYS:  FOTO TBD due to computer issues 10/28/22: 53(58 predicted)  MUSCLE LENGTH: deferred  POSTURE:  deferred  PALPATION: deferred  LOWER EXTREMITY ROM:  Active ROM Right eval Left eval  Hip flexion  85d  Hip extension  neutral  Hip abduction  WFL  Hip adduction    Hip internal rotation    Hip external rotation    Knee flexion    Knee extension    Ankle dorsiflexion    Ankle plantarflexion    Ankle inversion    Ankle eversion     (Blank rows = not tested)  LOWER EXTREMITY MMT:  MMT Right eval Left eval  Hip flexion  3  Hip extension  4-  Hip abduction  4-  Hip adduction    Hip internal rotation    Hip external rotation    Knee flexion    Knee extension    Ankle dorsiflexion    Ankle plantarflexion    Ankle inversion    Ankle eversion     (Blank rows = not tested)   FUNCTIONAL TESTS:   30s chair stand test 0 reps w/o UE support            mCTSIB 30s on position 1,2,3 unable to maintain position 4     10/18/22 0001  Dynamic Gait Index  Level Surface 3  Change in Gait Speed 3  Gait with Horizontal Head Turns 2  Gait with Vertical Head Turns 2  Gait and Pivot Turn 2  Step Over Obstacle 3  Step Around Obstacles 3  Steps 3  Total Score 21     GAIT: Distance walked: 62ftx2 Assistive device utilized: None Level of assistance: Complete Independence Comments: decreased trunk rotation   TODAY'S TREATMENT:           [facility] Adult PT Treatment:                                                DATE: 10/28/22 Therapeutic Exercise: Nustep L2 8 min Bridge 15x SLR 15/15 S/L abduction 15/15 Supine alternating marching 15/15 L hip  flexor stretch over table 30s x2 S/L clams 15/15 5x STS w/o UE support  DATE: 10/18/22 Eval    PATIENT EDUCATION:  Education details: Discussed eval findings, rehab rationale and POC and patient is in agreement  Person educated: Patient Education method: Explanation Education comprehension: verbalized understanding and needs further education  HOME EXERCISE PROGRAM: TBD Access Code: [email or web address] URL: [email or web address] Date: 10/28/2022 Prepared by: Gustavus Bryant  Exercises - Supine Bridge  - 2 x daily - 5 x weekly - 1 sets - 15 reps - Sidelying Hip Abduction  - 2 x daily - 5 x weekly - 1 sets - 15 reps - Small Range Straight Leg Raise  - 2 x daily - 5 x weekly - 1 sets - 15 reps - Supine March  - 2 x daily - 5 x weekly - 1 sets - 15 reps  ASSESSMENT:  CLINICAL IMPRESSION: First f/u session.  Focus was obtaining FOTO score and HEP.  Began aerobic work, B hip strengthening and stretching tasks.  Improved AROM in L hip flexion observed >90d   Patient is a 62 y.o. male  who was seen today for physical therapy evaluation and treatment for L hip weakness and gait instability with potential BPPV.  Patient presents with mild ROM restrictions in L hip flexion as well as marked weakness with L hip flexion.  DGI score 21/24 but unable to maintain position 4 with mCTSIB.  Functional LE strength deficits noted as patient unable to arise from chair w/o UE support.   OBJECTIVE IMPAIRMENTS: Abnormal gait, decreased activity tolerance, decreased balance, decreased endurance, decreased knowledge of condition, decreased mobility, difficulty walking, decreased ROM, decreased strength, and obesity.   ACTIVITY LIMITATIONS: squatting, stairs, and transfers  PERSONAL FACTORS: Fitness, Past/current experiences, and Time since onset of injury/illness/exacerbation are also affecting  patient's functional outcome.   REHAB POTENTIAL: Good  CLINICAL DECISION MAKING: Stable/uncomplicated  EVALUATION COMPLEXITY: Low   GOALS: Goals reviewed with patient? No  SHORT TERM GOALS=LONG TEM GOALS: Target date: 11/18/22 Patient to demonstrate independence in HEP  Baseline: TBD; [email or web address] Goal status: INITIAL  2.  Assess FOTO  Baseline: TBD; 53(58 predicted) Goal status: MET  3.  Patient to demo 1 rep on 30s chair stand test w/o UE support Baseline: 0 reps; 5 reps w/o UE support Goal status: Met  4.  Increase L hip flexion strength to 3+/5 Baseline:  MMT Right eval Left eval  Hip flexion  3  Hip extension  4-  Hip abduction  4-   Goal status: INITIAL    PLAN:  PT FREQUENCY: 1-2x/week  PT DURATION: 6 weeks  PLANNED INTERVENTIONS: Therapeutic exercises, Therapeutic activity, Neuromuscular re-education, Balance training, Gait training, Patient/Family education, Self Care, Joint mobilization, Stair training, DME instructions, Aquatic Therapy, Dry Needling, Electrical stimulation, Cryotherapy, Moist heat, Manual therapy, and Re-evaluation  PLAN FOR NEXT SESSION: HEP review and update, manual techniques as appropriate, aerobic tasks, ROM and flexibility activities, strengthening and PREs, TPDN, gait and balance training as needed     Hildred Laser, PT 10/28/2022, 9:54 AM   Check all possible CPT codes: 45409 - PT Re-evaluation, 97110- Therapeutic Exercise, [phone]- Neuro Re-education, [phone] - Gait Training, [phone] - Manual Therapy, 97530 - Therapeutic Activities, and [phone] - Self Care    Check all conditions that are expected to impact treatment: {Conditions expected to impact treatment:Morbid obesity and Respiratory disorders   If treatment provided at initial evaluation, no treatment charged due to lack of authorization.

## 2022-10-31 DIAGNOSIS — R42 Dizziness and giddiness: Secondary | ICD-10-CM | POA: Diagnosis not present

## 2022-11-03 NOTE — Therapy (Signed)
OUTPATIENT PHYSICAL THERAPY TREATMENT NOTE   Patient Name: Allen Espinoza MRN: [id number] DOB:12/07/1960, 62 y.o., male Today's Date: 11/04/2022  END OF SESSION:  PT End of Session - 11/04/22 0829     Visit Number 3    Number of Visits 5    Date for PT Re-Evaluation 12/19/22    Authorization Type MCD    PT Start Time 0830    PT Stop Time 0910    PT Time Calculation (min) 40 min    Activity Tolerance Patient tolerated treatment well    Behavior During Therapy WFL for tasks assessed/performed               Past Medical History:  Diagnosis Date   Abscess    Between legs   Arthritis    all over   Asthma    Avascular necrosis of femoral head, left (HCC)    Blood transfusion without reported diagnosis    Cholelithiasis    CKD (chronic kidney disease) stage 3, GFR 30-59 ml/min (HCC)    sees kidney Dr.   [place] [address] disorder (HCC)    left DVT   Diverticulosis    DVT (deep venous thrombosis) (HCC)    legs   Dyspnea    when walking   Dysrhythmia    remembers mother taking about having an irregular rhythm years when he was a child    GERD (gastroesophageal reflux disease)    HIV (human immunodeficiency virus infection) (HCC)    Hypertension    Morbid obesity (HCC)    Past Surgical History:  Procedure Laterality Date   COLONOSCOPY  2019   DENTAL SURGERY     had teeth pulled   HYDRADENITIS EXCISION Left 10/14/2016   Procedure: WIDE EXCISION HIDRADENITIS LEFT AXILLA;  Surgeon: Abigail Miyamoto, MD;  Location: [facility] OR;  Service: General;  Laterality: Left;   JOINT REPLACEMENT     Left hip Dr. Magnus Ivan 11/29/16   TOTAL HIP ARTHROPLASTY Left 11/29/2016   Procedure: LEFT TOTAL HIP ARTHROPLASTY ANTERIOR APPROACH;  Surgeon: Kathryne Hitch, MD;  Location: [facility] ORS;  Service: Orthopedics;  Laterality: Left;   Patient Active Problem List   Diagnosis Date Noted   At high risk for cardiovascular disease 10/17/2022   Symptomatic bradycardia 10/17/2022   Subdural hematoma  (HCC) 11/12/2021   Asthma    GERD (gastroesophageal reflux disease)    SDH (subdural hematoma) (HCC) 10/24/2021   Multiple lipomas 03/10/2021   Healthcare maintenance 10/13/2020   Numbness 09/28/2020   Subcutaneous nodules 03/01/2019   Hypertension 10/19/2018   Sleep disorder 03/05/2018   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) 08/19/2016   Morbid obesity (HCC) 08/19/2016   Solitary pulmonary nodule 05/06/2016   Hidradenitis suppurativa 04/27/2016   Chest pain 04/26/2016   Avascular necrosis of bone of hip, left s/p TRH (HCC) 03/19/2016   HIV (human immunodeficiency virus infection) (HCC) 03/18/2016   DVT (deep venous thrombosis) (HCC) 03/18/2016   Chronic kidney disease, stage 3a (HCC) 03/18/2016   Cholelithiasis 03/18/2016   Diverticulosis of colon without hemorrhage 03/18/2016    PCP: Burton Apley, MD   REFERRING PROVIDER: Ihor Austin, NP  REFERRING DIAG: R26.9 (ICD-10-CM) - Gait disorder R26.81 (ICD-10-CM) - Unsteadiness on feet  THERAPY DIAG:  Unsteadiness on feet  Muscle weakness (generalized)  Rationale for Evaluation and Treatment: Rehabilitation  ONSET DATE: chronic  SUBJECTIVE:   SUBJECTIVE STATEMENT: Has not bee fully compliant with HEP but hip feels better and mobility has improved  but still has difficulty"walking a straight line".  PERTINENT HISTORY: He does complain of feeling off balance and unsteady on his feet, has a hard time walking a straight line, has been present since his hip replacement a few years ago. Feels like his left leg is harder to move. He recently started going to a local gym about 3 weeks ago. He has not had any recent falls, does not use AD. Will occasionally have headache if neck in odd position too long (after sleeping or watching TV in bed) but otherwise has not been having headaches. No longer taking gabapentin. Will have occasional numbness on right side of head (starts occipital and  radiates up) lasting only short duration over the past year. Denies pain associated.  PAIN:  Are you having pain? No  PRECAUTIONS: Fall  RED FLAGS: None   WEIGHT BEARING RESTRICTIONS: No  FALLS:  Has patient fallen in last 6 months? No  OCCUPATION: not working  PLOF: Independent  PATIENT GOALS: To become more steady on my feet  NEXT MD VISIT: as needed  OBJECTIVE:   DIAGNOSTIC FINDINGS: none  PATIENT SURVEYS:  FOTO TBD due to computer issues 10/28/22: 53(58 predicted)  MUSCLE LENGTH: deferred  POSTURE:  deferred  PALPATION: deferred  LOWER EXTREMITY ROM:  Active ROM Right eval Left eval  Hip flexion  85d  Hip extension  neutral  Hip abduction  WFL  Hip adduction    Hip internal rotation    Hip external rotation    Knee flexion    Knee extension    Ankle dorsiflexion    Ankle plantarflexion    Ankle inversion    Ankle eversion     (Blank rows = not tested)  LOWER EXTREMITY MMT:  MMT Right eval Left eval  Hip flexion  3  Hip extension  4-  Hip abduction  4-  Hip adduction    Hip internal rotation    Hip external rotation    Knee flexion    Knee extension    Ankle dorsiflexion    Ankle plantarflexion    Ankle inversion    Ankle eversion     (Blank rows = not tested)   FUNCTIONAL TESTS:   30s chair stand test 0 reps w/o UE support            mCTSIB 30s on position 1,2,3 unable to maintain position 4     10/18/22 0001  Dynamic Gait Index  Level Surface 3  Change in Gait Speed 3  Gait with Horizontal Head Turns 2  Gait with Vertical Head Turns 2  Gait and Pivot Turn 2  Step Over Obstacle 3  Step Around Obstacles 3  Steps 3  Total Score 21     GAIT: Distance walked: 71ftx2 Assistive device utilized: None Level of assistance: Complete Independence Comments: decreased trunk rotation   TODAY'S TREATMENT:      [facility] Adult PT Treatment:                                                DATE: 11/04/22 Therapeutic Exercise: Nustep L4  8 min Bridge 15x w/ball SLR 15/15 2# Supine alternating marching 15/15 2# L hip flexor stretch over table 30s x2 S/L clams 15/15 BluTB Supine hip fallouts BluTB 15 xB, 15/15 unilateral  5x STS w/o UE support Runners step 6 in 15/15 UE support  Step overs 6 in 10/10 16 steps using B rails and step through pattern        [facility] Adult PT Treatment:                                                DATE: 10/28/22 Therapeutic Exercise: Nustep L2 8 min Bridge 15x SLR 15/15 S/L abduction 15/15 Supine alternating marching 15/15 L hip flexor stretch over table 30s x2 S/L clams 15/15 5x STS w/o UE support                                                                                                                   DATE: 10/18/22 Eval    PATIENT EDUCATION:  Education details: Discussed eval findings, rehab rationale and POC and patient is in agreement  Person educated: Patient Education method: Explanation Education comprehension: verbalized understanding and needs further education  HOME EXERCISE PROGRAM: TBD Access Code: [email or web address] URL: [email or web address] Date: 10/28/2022 Prepared by: Gustavus Bryant  Exercises - Supine Bridge  - 2 x daily - 5 x weekly - 1 sets - 15 reps - Sidelying Hip Abduction  - 2 x daily - 5 x weekly - 1 sets - 15 reps - Small Range Straight Leg Raise  - 2 x daily - 5 x weekly - 1 sets - 15 reps - Supine March  - 2 x daily - 5 x weekly - 1 sets - 15 reps  ASSESSMENT:  CLINICAL IMPRESSION: Has been partially compliant with HEP.  Main complaint is LLE feeling "heavy".  No pain reported.  Advanced difficulty and resistance, progressed to proprioceptive tasks and stepping tasks.     Patient is a 62 y.o. male  who was seen today for physical therapy evaluation and treatment for L hip weakness and gait instability with potential BPPV.  Patient presents with mild ROM restrictions in L hip flexion as well as marked weakness with L hip flexion.  DGI  score 21/24 but unable to maintain position 4 with mCTSIB.  Functional LE strength deficits noted as patient unable to arise from chair w/o UE support.   OBJECTIVE IMPAIRMENTS: Abnormal gait, decreased activity tolerance, decreased balance, decreased endurance, decreased knowledge of condition, decreased mobility, difficulty walking, decreased ROM, decreased strength, and obesity.   ACTIVITY LIMITATIONS: squatting, stairs, and transfers  PERSONAL FACTORS: Fitness, Past/current experiences, and Time since onset of injury/illness/exacerbation are also affecting patient's functional outcome.   REHAB POTENTIAL: Good  CLINICAL DECISION MAKING: Stable/uncomplicated  EVALUATION COMPLEXITY: Low   GOALS: Goals reviewed with patient? No  SHORT TERM GOALS=LONG TEM GOALS: Target date: 11/18/22 Patient to demonstrate independence in HEP  Baseline: TBD; [email or web address] Goal status: Partially compliant  2.  Assess FOTO  Baseline: TBD; 53(58 predicted) Goal status: MET  3.  Patient to demo 1 rep on 30s chair stand test w/o UE support Baseline: 0 reps; 5 reps w/o  UE support Goal status: Met  4.  Increase L hip flexion strength to 3+/5 Baseline:  MMT Right eval Left eval  Hip flexion  3  Hip extension  4-  Hip abduction  4-   Goal status: INITIAL    PLAN:  PT FREQUENCY: 1-2x/week  PT DURATION: 6 weeks  PLANNED INTERVENTIONS: Therapeutic exercises, Therapeutic activity, Neuromuscular re-education, Balance training, Gait training, Patient/Family education, Self Care, Joint mobilization, Stair training, DME instructions, Aquatic Therapy, Dry Needling, Electrical stimulation, Cryotherapy, Moist heat, Manual therapy, and Re-evaluation  PLAN FOR NEXT SESSION: HEP review and update, manual techniques as appropriate, aerobic tasks, ROM and flexibility activities, strengthening and PREs, TPDN, gait and balance training as needed     Hildred Laser, PT 11/04/2022, 9:09 AM   Check all possible  CPT codes: 16109 - PT Re-evaluation, 97110- Therapeutic Exercise, [phone]- Neuro Re-education, [phone] - Gait Training, [phone] - Manual Therapy, 97530 - Therapeutic Activities, and [phone] - Self Care    Check all conditions that are expected to impact treatment: {Conditions expected to impact treatment:Morbid obesity and Respiratory disorders   If treatment provided at initial evaluation, no treatment charged due to lack of authorization.

## 2022-11-04 ENCOUNTER — Ambulatory Visit: Payer: Medicaid Other | Attending: Adult Health

## 2022-11-04 DIAGNOSIS — M6281 Muscle weakness (generalized): Secondary | ICD-10-CM | POA: Diagnosis not present

## 2022-11-04 DIAGNOSIS — R2681 Unsteadiness on feet: Secondary | ICD-10-CM | POA: Insufficient documentation

## 2022-11-06 DIAGNOSIS — N183 Chronic kidney disease, stage 3 unspecified: Secondary | ICD-10-CM | POA: Diagnosis not present

## 2022-11-06 DIAGNOSIS — L732 Hidradenitis suppurativa: Secondary | ICD-10-CM | POA: Diagnosis not present

## 2022-11-06 DIAGNOSIS — I129 Hypertensive chronic kidney disease with stage 1 through stage 4 chronic kidney disease, or unspecified chronic kidney disease: Secondary | ICD-10-CM | POA: Diagnosis not present

## 2022-11-06 DIAGNOSIS — I82409 Acute embolism and thrombosis of unspecified deep veins of unspecified lower extremity: Secondary | ICD-10-CM | POA: Diagnosis not present

## 2022-11-06 DIAGNOSIS — S065XAA Traumatic subdural hemorrhage with loss of consciousness status unknown, initial encounter: Secondary | ICD-10-CM | POA: Diagnosis not present

## 2022-11-06 DIAGNOSIS — B20 Human immunodeficiency virus [HIV] disease: Secondary | ICD-10-CM | POA: Diagnosis not present

## 2022-11-06 NOTE — Unmapped (Signed)
[facility] Clinical Assessment & Refill Coordination Note    Dustin Prince, Dustin Prince: Nov 15, 1960  Phone: [phone] (home)     All above HIPAA information was verified with patient.     Was a Nurse, learning disability used for this call? No    Specialty Medication(s):   Inflammatory Disorders: Humira     Current Outpatient Medications   Medication Sig Dispense Refill    adalimumab (HUMIRA,CF, PEN) 80 mg/0.8 mL PnKt Inject the contents of 1 pen (80 mg total) under the skin every seven (7) days. 12 each 3    ADALIMUMAB PEN CITRATE FREE 40 MG/0.4 ML Inject the contents of 1 pen (40 mg total) under the skin once a week. 4 each 11    albuterol HFA 90 mcg/actuation inhaler Inhale 2 puffs.      bictegrav-emtricit-tenofov ala (BIKTARVY) 50-200-25 mg tablet Take 1 tablet by mouth daily.      cabotegravir-rilpivirine (CABENUVA) 600 mg-900 mg/3 mL extended-release injection Inject 1 kit into the muscle.      CLONAZEPAM ORAL Take by mouth.      cyclobenzaprine (FLEXERIL) 5 MG tablet TAKE 1 TABLET BY MOUTH AT BEDTIME      furosemide (LASIX) 20 MG tablet Take 20 mg by mouth daily.      HYDROcodone-acetaminophen (NORCO) 5-325 mg per tablet Take 1-2 tablets every 6 hours as needed for pain 15 tablet 0    losartan (COZAAR) 50 MG tablet       methocarbamol (ROBAXIN) 750 MG tablet Take 1 tablet (750 mg total) by mouth.      metroNIDAZOLE (FLAGYL) 250 MG tablet TAKE 1 TABLET BY MOUTH THREE TIMES DAILY 90 tablet 2    pantoprazole (PROTONIX) 40 MG tablet Take 40 mg by mouth daily.      rivaroxaban (XARELTO) 20 mg tablet Take 20 mg by mouth daily with evening meal.      triamcinolone (KENALOG) 0.1 % ointment Apply topically two (2) times a day. To affected area(s) as needed for dermatitis in the gluteal cleft 454 g 2     No current facility-administered medications for this visit.        Changes to medications: Dustin Prince reports no changes at this time.    Allergies   Allergen Reactions    Sulfasalazine      Other reaction(s): Other (See Comments)  Low blood pressure    Vancomycin Itching     Give with benadryl       Changes to allergies: No    SPECIALTY MEDICATION ADHERENCE     Humira 40mg /0.34mL : 0 days of medicine on hand     Medication Adherence    Patient reported X missed doses in the last month: 0  Specialty Medication: Humira 40mg /0.72mL  Informant: patient  Support network for adherence: family member  Confirmed plan for next specialty medication refill: delivery by pharmacy  Refills needed for supportive medications: not needed          Specialty medication(s) dose(s) confirmed: Regimen is correct and unchanged.     Are there any concerns with adherence? No    Adherence counseling provided? Not needed    CLINICAL MANAGEMENT AND INTERVENTION      Clinical Benefit Assessment:    Do you feel the medicine is effective or helping your condition? Yes    Clinical Benefit counseling provided? Not needed    Adverse Effects Assessment:    Are you experiencing any side effects? No    Are you experiencing difficulty  administering your medicine? No    Quality of Life Assessment:    Quality of Life    Rheumatology  Oncology  Dermatology  1. What impact has your specialty medication had on the symptoms of your skin condition (i.e. itchiness, soreness, stinging)?: Minimal  2. What impact has your specialty medication had on your comfort level with your skin?: Minimal  Cystic Fibrosis          How many days over the past month did your HS  keep you from your normal activities? For example, brushing your teeth or getting up in the morning. Patient declined to answer    Have you discussed this with your provider? Not needed    Acute Infection Status:    Acute infections noted within Epic:  No active infections  Patient reported infection: None    Therapy Appropriateness:    Is therapy appropriate based on current medication list, adverse reactions, adherence, clinical benefit and progress toward achieving therapeutic goals? Yes, therapy is appropriate and should be continued     DISEASE/MEDICATION-SPECIFIC INFORMATION      For patients on injectable medications: Patient currently has 0 doses left.  Next injection is scheduled for 11/13/2022.    Chronic Inflammatory Diseases: Have you experienced any flares in the last month? No  Has this been reported to your provider? Not applicable    PATIENT SPECIFIC NEEDS     Does the patient have any physical, cognitive, or cultural barriers? No    Is the patient high risk? No    Did the patient require a clinical intervention? No    Does the patient require physician intervention or other additional services (i.e., nutrition, smoking cessation, social work)? No    SOCIAL DETERMINANTS OF HEALTH     At the [facility], we have learned that life circumstances - like trouble affording food, housing, utilities, or transportation can affect the health of many of our patients.   That is why we wanted to ask: are you currently experiencing any life circumstances that are negatively impacting your health and/or quality of life? Patient declined to answer    Social Determinants of Health     Financial Resource Strain: Medium Risk (02/18/2022)    Received from [facility]    Overall Financial Resource Strain (CARDIA)     Difficulty of Paying Living Expenses: Somewhat hard   Internet Connectivity: Not on file   Food Insecurity: No Food Insecurity (04/22/2022)    Received from [facility]    Hunger Vital Sign     Worried About Running Out of Food in the Last Year: Never true     Ran Out of Food in the Last Year: Never true   Tobacco Use: Low Risk  (06/03/2022)    Patient History     Smoking Tobacco Use: Never     Smokeless Tobacco Use: Never     Passive Exposure: Not on file   Housing/Utilities: Not on file   Alcohol Use: Not on file   Transportation Needs: No Transportation Needs (04/22/2022)    Received from [facility] - Transportation     Lack of Transportation (Medical): No     Lack of Transportation (Non-Medical): No   Substance Use: Not on file   Health Literacy: Not on file   Physical Activity: Insufficiently Active (12/10/2017)    Received from [facility]    Exercise Vital Sign     Days of Exercise per Week: 2 days  Minutes of Exercise per Session: 10 min   Interpersonal Safety: Unknown (11/06/2022)    Interpersonal Safety     Unsafe Where You Currently Live: Not on file     Physically Hurt by Anyone: Not on file     Abused by Anyone: Not on file   Stress: No Stress Concern Present (12/10/2017)    Received from [facility] - Occupational Stress Questionnaire     Feeling of Stress : Only a little   Intimate Partner Violence: Not At Risk (12/10/2017)    Received from [facility]    Humiliation, Afraid, Rape, and Kick questionnaire     Fear of Current or Ex-Partner: No     Emotionally Abused: No     Physically Abused: No     Sexually Abused: No   Depression: Not on file   Social Connections: Somewhat Isolated (12/10/2017)    Received from [facility]    Social Connection and Isolation Panel [NHANES]     Frequency of Communication with Friends and Family: Three times a week     Frequency of Social Gatherings with Friends and Family: More than three times a week     Attends Religious Services: Never     Database administrator or Organizations: No     Attends Banker Meetings: Never     Marital Status: Living with partner       Would you be willing to receive help with any of the needs that you have identified today? Not applicable       SHIPPING     Specialty Medication(s) to be Shipped:   Inflammatory Disorders: Humira    Other medication(s) to be shipped: No additional medications requested for fill at this time     Changes to insurance: No    Delivery Scheduled: Yes, Expected medication delivery date: 11/13/2022.     Medication will be delivered via UPS to the confirmed prescription address in [facility].    The patient will receive a drug information handout for each medication shipped and additional FDA Medication Guides as required.  Verified that patient has previously received a Conservation officer, historic buildings and a Surveyor, mining.    The patient or caregiver noted above participated in the development of this care plan and knows that they can request review of or adjustments to the care plan at any time.      All of the patient's questions and concerns have been addressed.    Elnora Morrison, PharmD   [facility] Specialty Pharmacist

## 2022-11-06 NOTE — Unmapped (Signed)
TC to [facility] at [phone]. Provided verbal order to [facility], Pharmacist for one time order of Humira Pens CF 40 mg/0.4 ml.  They will reach out to Mr. Dustin Prince to schedule delivery.   Patient notified via mychart.

## 2022-11-06 NOTE — Unmapped (Signed)
Call from [facility].  Need one time order for Humira pens due to patient error on 10/24/22.  The replacement will be free to patient.  Can send electronic Rx , fax to [phone], or verbal order to [phone].

## 2022-11-08 NOTE — Therapy (Unsigned)
OUTPATIENT PHYSICAL THERAPY TREATMENT NOTE   Patient Name: Allen Espinoza MRN: [id number] DOB:[birth year], 62 y.o., male Today's Date: 11/08/2022  END OF SESSION:      Past Medical History:  Diagnosis Date   Abscess    Between legs   Arthritis    all over   Asthma    Avascular necrosis of femoral head, left (HCC)    Blood transfusion without reported diagnosis    Cholelithiasis    CKD (chronic kidney disease) stage 3, GFR 30-59 ml/min (HCC)    sees kidney Dr.   [address] disorder (HCC)    left DVT   Diverticulosis    DVT (deep venous thrombosis) (HCC)    legs   Dyspnea    when walking   Dysrhythmia    remembers mother taking about having an irregular rhythm years when he was a child    GERD (gastroesophageal reflux disease)    HIV (human immunodeficiency virus infection) (HCC)    Hypertension    Morbid obesity (HCC)    Past Surgical History:  Procedure Laterality Date   COLONOSCOPY  2019   DENTAL SURGERY     had teeth pulled   HYDRADENITIS EXCISION Left 10/14/2016   Procedure: WIDE EXCISION HIDRADENITIS LEFT AXILLA;  Surgeon: Abigail Miyamoto, MD;  Location: [facility] OR;  Service: General;  Laterality: Left;   JOINT REPLACEMENT     Left hip Dr. Magnus Ivan 11/29/16   TOTAL HIP ARTHROPLASTY Left 11/29/2016   Procedure: LEFT TOTAL HIP ARTHROPLASTY ANTERIOR APPROACH;  Surgeon: Kathryne Hitch, MD;  Location: [facility] ORS;  Service: Orthopedics;  Laterality: Left;   Patient Active Problem List   Diagnosis Date Noted   At high risk for cardiovascular disease 10/17/2022   Symptomatic bradycardia 10/17/2022   Subdural hematoma (HCC) 11/12/2021   Asthma    GERD (gastroesophageal reflux disease)    SDH (subdural hematoma) (HCC) 10/24/2021   Multiple lipomas 03/10/2021   Healthcare maintenance 10/13/2020   Numbness 09/28/2020   Subcutaneous nodules 03/01/2019   Hypertension 10/19/2018   Sleep disorder 03/05/2018   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) 08/19/2016   Morbid obesity (HCC) 08/19/2016   Solitary pulmonary nodule 05/06/2016   Hidradenitis suppurativa 04/27/2016   Chest pain 04/26/2016   Avascular necrosis of bone of hip, left s/p TRH (HCC) 03/19/2016   HIV (human immunodeficiency virus infection) (HCC) 03/18/2016   DVT (deep venous thrombosis) (HCC) 03/18/2016   Chronic kidney disease, stage 3a (HCC) 03/18/2016   Cholelithiasis 03/18/2016   Diverticulosis of colon without hemorrhage 03/18/2016    PCP: Burton Apley, MD   REFERRING PROVIDER: Ihor Austin, NP  REFERRING DIAG: R26.9 (ICD-10-CM) - Gait disorder R26.81 (ICD-10-CM) - Unsteadiness on feet  THERAPY DIAG:  No diagnosis found.  Rationale for Evaluation and Treatment: Rehabilitation  ONSET DATE: chronic  SUBJECTIVE:   SUBJECTIVE STATEMENT: Has not bee fully compliant with HEP but hip feels better and mobility has improved but still has difficulty"walking a straight line".  PERTINENT HISTORY: He does complain of feeling off balance and unsteady on his feet, has a hard time walking a straight line, has been present since his hip replacement a few years ago. Feels like his left leg is harder to move. He recently started going to a local gym about 3 weeks ago. He has not had any recent falls, does not use AD. Will occasionally have headache if neck in odd position too long (after sleeping or watching TV in bed)  but otherwise has not been having headaches. No longer taking gabapentin. Will have occasional numbness on right side of head (starts occipital and radiates up) lasting only short duration over the past year. Denies pain associated.  PAIN:  Are you having pain? No  PRECAUTIONS: Fall  RED FLAGS: None   WEIGHT BEARING RESTRICTIONS: No  FALLS:  Has patient fallen in last 6 months? No  OCCUPATION: not working  PLOF: Independent  PATIENT GOALS: To become more steady on my feet  NEXT MD VISIT: as needed  OBJECTIVE:    DIAGNOSTIC FINDINGS: none  PATIENT SURVEYS:  FOTO TBD due to computer issues 10/28/22: 53(58 predicted)  MUSCLE LENGTH: deferred  POSTURE:  deferred  PALPATION: deferred  LOWER EXTREMITY ROM:  Active ROM Right eval Left eval  Hip flexion  85d  Hip extension  neutral  Hip abduction  WFL  Hip adduction    Hip internal rotation    Hip external rotation    Knee flexion    Knee extension    Ankle dorsiflexion    Ankle plantarflexion    Ankle inversion    Ankle eversion     (Blank rows = not tested)  LOWER EXTREMITY MMT:  MMT Right eval Left eval  Hip flexion  3  Hip extension  4-  Hip abduction  4-  Hip adduction    Hip internal rotation    Hip external rotation    Knee flexion    Knee extension    Ankle dorsiflexion    Ankle plantarflexion    Ankle inversion    Ankle eversion     (Blank rows = not tested)   FUNCTIONAL TESTS:   30s chair stand test 0 reps w/o UE support            mCTSIB 30s on position 1,2,3 unable to maintain position 4     10/18/22 0001  Dynamic Gait Index  Level Surface 3  Change in Gait Speed 3  Gait with Horizontal Head Turns 2  Gait with Vertical Head Turns 2  Gait and Pivot Turn 2  Step Over Obstacle 3  Step Around Obstacles 3  Steps 3  Total Score 21     GAIT: Distance walked: 44ftx2 Assistive device utilized: None Level of assistance: Complete Independence Comments: decreased trunk rotation   TODAY'S TREATMENT:      [facility] Adult PT Treatment:                                                DATE: 11/04/22 Therapeutic Exercise: Nustep L4 8 min Bridge 15x w/ball SLR 15/15 2# Supine alternating marching 15/15 2# L hip flexor stretch over table 30s x2 S/L clams 15/15 BluTB Supine hip fallouts BluTB 15 xB, 15/15 unilateral  5x STS w/o UE support Runners step 6 in 15/15 UE support Step overs 6 in 10/10 16 steps using B rails and step through pattern        [facility] Adult PT Treatment:                                                 DATE: 10/28/22 Therapeutic Exercise: Nustep L2 8 min Bridge 15x SLR 15/15 S/L abduction 15/15 Supine alternating marching 15/15  L hip flexor stretch over table 30s x2 S/L clams 15/15 5x STS w/o UE support                                                                                                                   DATE: 10/18/22 Eval    PATIENT EDUCATION:  Education details: Discussed eval findings, rehab rationale and POC and patient is in agreement  Person educated: Patient Education method: Explanation Education comprehension: verbalized understanding and needs further education  HOME EXERCISE PROGRAM: TBD Access Code: [email or web address] URL: [email or web address] Date: 10/28/2022 Prepared by: Gustavus Bryant  Exercises - Supine Bridge  - 2 x daily - 5 x weekly - 1 sets - 15 reps - Sidelying Hip Abduction  - 2 x daily - 5 x weekly - 1 sets - 15 reps - Small Range Straight Leg Raise  - 2 x daily - 5 x weekly - 1 sets - 15 reps - Supine March  - 2 x daily - 5 x weekly - 1 sets - 15 reps  ASSESSMENT:  CLINICAL IMPRESSION: Has been partially compliant with HEP.  Main complaint is LLE feeling "heavy".  No pain reported.  Advanced difficulty and resistance, progressed to proprioceptive tasks and stepping tasks.     Patient is a 61 y.o. male  who was seen today for physical therapy evaluation and treatment for L hip weakness and gait instability with potential BPPV.  Patient presents with mild ROM restrictions in L hip flexion as well as marked weakness with L hip flexion.  DGI score 21/24 but unable to maintain position 4 with mCTSIB.  Functional LE strength deficits noted as patient unable to arise from chair w/o UE support.   OBJECTIVE IMPAIRMENTS: Abnormal gait, decreased activity tolerance, decreased balance, decreased endurance, decreased knowledge of condition, decreased mobility, difficulty walking, decreased ROM, decreased strength, and obesity.    ACTIVITY LIMITATIONS: squatting, stairs, and transfers  PERSONAL FACTORS: Fitness, Past/current experiences, and Time since onset of injury/illness/exacerbation are also affecting patient's functional outcome.   REHAB POTENTIAL: Good  CLINICAL DECISION MAKING: Stable/uncomplicated  EVALUATION COMPLEXITY: Low   GOALS: Goals reviewed with patient? No  SHORT TERM GOALS=LONG TEM GOALS: Target date: 11/18/22 Patient to demonstrate independence in HEP  Baseline: TBD; [email or web address] Goal status: Partially compliant  2.  Assess FOTO  Baseline: TBD; 53(58 predicted) Goal status: MET  3.  Patient to demo 1 rep on 30s chair stand test w/o UE support Baseline: 0 reps; 5 reps w/o UE support Goal status: Met  4.  Increase L hip flexion strength to 3+/5 Baseline:  MMT Right eval Left eval  Hip flexion  3  Hip extension  4-  Hip abduction  4-   Goal status: INITIAL    PLAN:  PT FREQUENCY: 1-2x/week  PT DURATION: 6 weeks  PLANNED INTERVENTIONS: Therapeutic exercises, Therapeutic activity, Neuromuscular re-education, Balance training, Gait training, Patient/Family education, Self Care, Joint mobilization, Stair training, DME instructions, Aquatic Therapy, Dry Needling, Electrical stimulation, Cryotherapy, Moist heat, Manual therapy, and Re-evaluation  PLAN FOR NEXT SESSION: HEP review and update, manual techniques as appropriate, aerobic tasks, ROM and flexibility activities, strengthening and PREs, TPDN, gait and balance training as needed     Hildred Laser, PT 11/08/2022, 8:03 AM   Check all possible CPT codes: 16109 - PT Re-evaluation, 97110- Therapeutic Exercise, [phone]- Neuro Re-education, [phone] - Gait Training, [phone] - Manual Therapy, [phone] - Therapeutic Activities, and [phone] - Self Care    Check all conditions that are expected to impact treatment: {Conditions expected to impact treatment:Morbid obesity and Respiratory disorders   If treatment provided at initial  evaluation, no treatment charged due to lack of authorization.

## 2022-11-11 ENCOUNTER — Ambulatory Visit: Payer: Medicaid Other

## 2022-11-11 DIAGNOSIS — M6281 Muscle weakness (generalized): Secondary | ICD-10-CM | POA: Diagnosis not present

## 2022-11-11 DIAGNOSIS — R2681 Unsteadiness on feet: Secondary | ICD-10-CM

## 2022-11-12 MED FILL — HUMIRA PEN CITRATE FREE 40 MG/0.4 ML: SUBCUTANEOUS | 28 days supply | Qty: 4 | Fill #1

## 2022-11-15 NOTE — Therapy (Unsigned)
OUTPATIENT PHYSICAL THERAPY TREATMENT NOTE/DISCHARGE SUMMARY   Patient Name: Allen Espinoza MRN: [id number] DOB:[birth year], 62 y.o., male Today's Date: 11/18/2022 PHYSICAL THERAPY DISCHARGE SUMMARY  Visits from Start of Care: 5  Current functional level related to goals / functional outcomes: Goals met   Remaining deficits: Endurance   Education / Equipment: HEP   Patient agrees to discharge. Patient goals were met. Patient is being discharged due to being pleased with the current functional level.  END OF SESSION:  PT End of Session - 11/18/22 0830     Visit Number 5    Number of Visits 5    Date for PT Re-Evaluation 12/19/22    Authorization Type MCD    PT Start Time 0830    PT Stop Time 0910    PT Time Calculation (min) 40 min    Activity Tolerance Patient tolerated treatment well    Behavior During Therapy WFL for tasks assessed/performed                 Past Medical History:  Diagnosis Date   Abscess    Between legs   Arthritis    all over   Asthma    Avascular necrosis of femoral head, left (HCC)    Blood transfusion without reported diagnosis    Cholelithiasis    CKD (chronic kidney disease) stage 3, GFR 30-59 ml/min (HCC)    sees kidney Dr.   [place] disorder (HCC)    left DVT   Diverticulosis    DVT (deep venous thrombosis) (HCC)    legs   Dyspnea    when walking   Dysrhythmia    remembers mother taking about having an irregular rhythm years when he was a child    GERD (gastroesophageal reflux disease)    HIV (human immunodeficiency virus infection) (HCC)    Hypertension    Morbid obesity (HCC)    Past Surgical History:  Procedure Laterality Date   COLONOSCOPY  2019   DENTAL SURGERY     had teeth pulled   HYDRADENITIS EXCISION Left 10/14/2016   Procedure: WIDE EXCISION HIDRADENITIS LEFT AXILLA;  Surgeon: Abigail Miyamoto, MD;  Location: [facility] OR;  Service: General;  Laterality: Left;   JOINT REPLACEMENT     Left hip Dr. Magnus Ivan  11/29/16   TOTAL HIP ARTHROPLASTY Left 11/29/2016   Procedure: LEFT TOTAL HIP ARTHROPLASTY ANTERIOR APPROACH;  Surgeon: Kathryne Hitch, MD;  Location: [facility] ORS;  Service: Orthopedics;  Laterality: Left;   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) 08/19/2016   Morbid obesity (HCC) 08/19/2016   Solitary pulmonary nodule 05/06/2016   Hidradenitis suppurativa 04/27/2016   Chest pain 04/26/2016   Avascular necrosis of bone of hip, left s/p TRH (HCC) 03/19/2016   HIV (human immunodeficiency virus infection) (HCC) 03/18/2016   DVT (deep venous thrombosis) (HCC) 03/18/2016   Chronic kidney disease, stage 3a (HCC) 03/18/2016   Cholelithiasis 03/18/2016   Diverticulosis of colon without hemorrhage 03/18/2016    PCP: Burton Apley, MD   REFERRING PROVIDER:  Ihor Austin, NP  REFERRING DIAG: R26.9 (ICD-10-CM) - Gait disorder R26.81 (ICD-10-CM) - Unsteadiness on feet  THERAPY DIAG:  Unsteadiness on feet  Muscle weakness (generalized)  Rationale for Evaluation and Treatment: Rehabilitation  ONSET DATE: chronic  SUBJECTIVE:   SUBJECTIVE STATEMENT: Doing well, feels he is ready for independent management  PERTINENT HISTORY: He does complain of feeling off balance and unsteady on his feet, has a hard time walking a straight line, has been present since his hip replacement a few years ago. Feels like his left leg is harder to move. He recently started going to a local gym about 3 weeks ago. He  has not had any recent falls, does not use AD. Will occasionally have headache if neck in odd position too long (after sleeping or watching TV in bed) but otherwise has not been having headaches. No longer taking gabapentin. Will have occasional numbness on right side of head (starts occipital and radiates up) lasting only short duration over the past year. Denies pain associated.  PAIN:  Are you having pain? No  PRECAUTIONS: Fall  RED FLAGS: None   WEIGHT BEARING RESTRICTIONS: No  FALLS:  Has patient fallen in last 6 months? No  OCCUPATION: not working  PLOF: Independent  PATIENT GOALS: To become more steady on my feet  NEXT MD VISIT: as needed  OBJECTIVE:   DIAGNOSTIC FINDINGS: none  PATIENT SURVEYS:  FOTO TBD due to computer issues 10/28/22: 53(58 predicted); 11/18/22 59  MUSCLE LENGTH: deferred  POSTURE:  deferred  PALPATION: deferred  LOWER EXTREMITY ROM:  Active ROM Right eval Left eval L 11/18/22  Hip flexion  85d 90/100d  Hip extension  neutral 5d  Hip abduction  [facility]  Hip adduction     Hip internal rotation     Hip external rotation     Knee flexion     Knee extension     Ankle dorsiflexion     Ankle plantarflexion     Ankle inversion     Ankle eversion      (Blank rows = not tested)  LOWER EXTREMITY MMT:  MMT Right eval Left eval L 11/18/22  Hip flexion  3 4-  Hip extension  4- 4  Hip abduction  4- 4  Hip adduction     Hip internal rotation     Hip external rotation     Knee flexion     Knee extension     Ankle dorsiflexion     Ankle plantarflexion     Ankle inversion     Ankle eversion      (Blank rows = not tested)   FUNCTIONAL TESTS:   30s chair stand test 0 reps w/o UE support            mCTSIB 30s on position 1,2,3 unable to maintain position 4     10/18/22 0001  Dynamic Gait Index  Level Surface 3  Change in Gait Speed 3  Gait with Horizontal Head Turns 2  Gait with Vertical Head Turns 2  Gait and Pivot Turn 2   Step Over Obstacle 3  Step Around Obstacles 3  Steps 3  Total Score 21     GAIT: Distance walked: 43ftx2 Assistive device utilized: None Level of assistance: Complete Independence Comments: decreased trunk rotation   TODAY'S TREATMENT:      [facility] Adult PT Treatment:  DATE: 11/18/22 Therapeutic Exercise: Nustep L6 8 min SLR L 15x2 5# S/L abduction L 15x2 5# Supine alternating marching 15/15 5# S/L clams 15/15 BlaTB Supine hip fallouts BlaTB 15 xB, 15/15 unilateral  10x STS w/o UE support 10# KB Runners step 8 in 15/15 UE support 10# KB  [facility] Adult PT Treatment:                                                DATE: 11/11/22 Therapeutic Exercise: Nustep L5 8 min Bridge 15x w/ball SLR 15/15 4# S/L abduction 4# 15/15 Supine alternating marching 15/15 4# L hip flexor stretch over table 30s x2 S/L clams 15/15 BlaTB Supine hip fallouts BlaTB 15 xB, 15/15 unilateral  10x STS w/o UE support 5# KB Runners step 6 in 15/15 UE support 5# KB    [facility] Adult PT Treatment:                                                DATE: 11/04/22 Therapeutic Exercise: Nustep L4 8 min Bridge 15x w/ball SLR 15/15 2# Supine alternating marching 15/15 2# L hip flexor stretch over table 30s x2 S/L clams 15/15 BluTB Supine hip fallouts BluTB 15 xB, 15/15 unilateral  5x STS w/o UE support Runners step 6 in 15/15 UE support Step overs 6 in 10/10 16 steps using B rails and step through pattern        [facility] Adult PT Treatment:                                                DATE: 10/28/22 Therapeutic Exercise: Nustep L2 8 min Bridge 15x SLR 15/15 S/L abduction 15/15 Supine alternating marching 15/15 L hip flexor stretch over table 30s x2 S/L clams 15/15 5x STS w/o UE support                                                                                                                   DATE: 10/18/22 Eval    PATIENT EDUCATION:  Education details:  Discussed eval findings, rehab rationale and POC and patient is in agreement  Person educated: Patient Education method: Explanation Education comprehension: verbalized understanding and needs further education  HOME EXERCISE PROGRAM: Access Code: [email or web address] URL: [email or web address] Date: 10/28/2022 Prepared by: Gustavus Bryant  Exercises - Supine Bridge  - 2 x daily - 5 x weekly - 1 sets - 15 reps - Sidelying Hip Abduction  - 2 x daily - 5 x weekly - 1 sets - 15 reps - Small Range Straight Leg Raise  - 2 x daily - 5 x weekly - 1  sets - 15 reps - Supine March  - 2 x daily - 5 x weekly - 1 sets - 15 reps  ASSESSMENT:  CLINICAL IMPRESSION: All goals met, patient ready for independent management   Patient is a 62 y.o. male  who was seen today for physical therapy evaluation and treatment for L hip weakness and gait instability with potential BPPV.  Patient presents with mild ROM restrictions in L hip flexion as well as marked weakness with L hip flexion.  DGI score 21/24 but unable to maintain position 4 with mCTSIB.  Functional LE strength deficits noted as patient unable to arise from chair w/o UE support.   OBJECTIVE IMPAIRMENTS: Abnormal gait, decreased activity tolerance, decreased balance, decreased endurance, decreased knowledge of condition, decreased mobility, difficulty walking, decreased ROM, decreased strength, and obesity.   ACTIVITY LIMITATIONS: squatting, stairs, and transfers  PERSONAL FACTORS: Fitness, Past/current experiences, and Time since onset of injury/illness/exacerbation are also affecting patient's functional outcome.   REHAB POTENTIAL: Good  CLINICAL DECISION MAKING: Stable/uncomplicated  EVALUATION COMPLEXITY: Low   GOALS: Goals reviewed with patient? No  SHORT TERM GOALS=LONG TEM GOALS: Target date: 11/18/22  Patient to demonstrate independence in HEP  Baseline: TBD; [email or web address] Goal status: Met  2.  Assess FOTO  Baseline: TBD; 53(58  predicted); 11/18/22 59 Goal status: MET  3.  Patient to demo 1 rep on 30s chair stand test w/o UE support Baseline: 0 reps; 5 reps w/o UE support Goal status: Met  4.  Increase L hip flexion strength to 3+/5 Baseline:  MMT Right eval Left eval L 11/18/22  Hip flexion  3 4-  Hip extension  4- 4  Hip abduction  4- 4   Goal status: Met    PLAN:  PT FREQUENCY: 1-2x/week  PT DURATION: 6 weeks  PLANNED INTERVENTIONS: Therapeutic exercises, Therapeutic activity, Neuromuscular re-education, Balance training, Gait training, Patient/Family education, Self Care, Joint mobilization, Stair training, DME instructions, Aquatic Therapy, Dry Needling, Electrical stimulation, Cryotherapy, Moist heat, Manual therapy, and Re-evaluation  PLAN FOR NEXT SESSION: HEP review and update, manual techniques as appropriate, aerobic tasks, ROM and flexibility activities, strengthening and PREs, TPDN, gait and balance training as needed     Hildred Laser, PT 11/18/2022, 9:16 AM   Check all possible CPT codes: 56213 - PT Re-evaluation, 97110- Therapeutic Exercise, [phone]- Neuro Re-education, [phone] - Gait Training, [phone] - Manual Therapy, 97530 - Therapeutic Activities, and [phone] - Self Care    Check all conditions that are expected to impact treatment: {Conditions expected to impact treatment:Morbid obesity and Respiratory disorders   If treatment provided at initial evaluation, no treatment charged due to lack of authorization.

## 2022-11-18 ENCOUNTER — Ambulatory Visit: Payer: Medicaid Other

## 2022-11-18 DIAGNOSIS — R2681 Unsteadiness on feet: Secondary | ICD-10-CM | POA: Diagnosis not present

## 2022-11-18 DIAGNOSIS — M6281 Muscle weakness (generalized): Secondary | ICD-10-CM | POA: Diagnosis not present

## 2022-11-25 ENCOUNTER — Other Ambulatory Visit (HOSPITAL_COMMUNITY): Payer: Self-pay

## 2022-12-01 DIAGNOSIS — L732 Hidradenitis suppurativa: Principal | ICD-10-CM

## 2022-12-01 MED ORDER — METRONIDAZOLE 250 MG TABLET
ORAL_TABLET | Freq: Three times a day (TID) | ORAL | 0 refills | 0.00000 days
Start: 2022-12-01 — End: ?

## 2022-12-03 MED ORDER — METRONIDAZOLE 250 MG TABLET
ORAL_TABLET | Freq: Three times a day (TID) | ORAL | 0 refills | 30.00000 days | Status: CP
Start: 2022-12-03 — End: ?

## 2022-12-06 ENCOUNTER — Telehealth: Payer: Self-pay

## 2022-12-06 NOTE — Telephone Encounter (Signed)
[facility] Patient Advocate Encounter  Patient's medication Renaldo Harrison) have been couriered to [facility] from [facility] and will be administered on the patient next office visit on 12/17/22.  Clearance Coots , CPhT Specialty Pharmacy Patient [facility] Phone: [phone] Fax:  [phone]

## 2022-12-09 DIAGNOSIS — N189 Chronic kidney disease, unspecified: Secondary | ICD-10-CM | POA: Diagnosis not present

## 2022-12-09 DIAGNOSIS — N183 Chronic kidney disease, stage 3 unspecified: Secondary | ICD-10-CM | POA: Diagnosis not present

## 2022-12-09 NOTE — Unmapped (Signed)
[facility] Refill Coordination Note    Specialty Medication(s) to be Shipped:   Inflammatory Disorders: Humira    Other medication(s) to be shipped: No additional medications requested for fill at this time     Dustin Prince, DOB: [birth year]  Phone: [phone] (home)       All above HIPAA information was verified with patient.     Was a Nurse, learning disability used for this call? No    Completed refill call assessment today to schedule patient's medication shipment from the [facility] [phone]).  All relevant notes have been reviewed.     Specialty medication(s) and dose(s) confirmed: Regimen is correct and unchanged.   Changes to medications: Dustin Prince reports no changes at this time.  Changes to insurance: No  New side effects reported not previously addressed with a pharmacist or physician: None reported  Questions for the pharmacist: No    Confirmed patient received a Conservation officer, historic buildings and a Surveyor, mining with first shipment. The patient will receive a drug information handout for each medication shipped and additional FDA Medication Guides as required.       DISEASE/MEDICATION-SPECIFIC INFORMATION        For patients on injectable medications: Patient currently has 1 doses left.  Next injection is scheduled for 9/12.    SPECIALTY MEDICATION ADHERENCE     Medication Adherence    Patient reported X missed doses in the last month: 0  Specialty Medication: ADALIMUMAB PEN CITRATE FREE 40 MG/0.4 ML  Patient is on additional specialty medications: No  Informant: patient  Support network for adherence: family member              Were doses missed due to medication being on hold? No    Humira 40/0.4 mg/ml: 3 days of medicine on hand       REFERRAL TO PHARMACIST     Referral to the pharmacist: Not needed      [facility]     Shipping address confirmed in Epic.     Patient was notified of new phone menu : Yes    Delivery Scheduled: Yes, Expected medication delivery date: 9/17. Medication will be delivered via UPS to the prescription address in Epic WAM.    Dustin Prince   [facility] Specialty Technician

## 2022-12-16 MED FILL — HUMIRA PEN CITRATE FREE 40 MG/0.4 ML: SUBCUTANEOUS | 28 days supply | Qty: 4 | Fill #2

## 2022-12-17 ENCOUNTER — Ambulatory Visit: Payer: Medicaid Other | Admitting: Pharmacist

## 2022-12-17 ENCOUNTER — Other Ambulatory Visit: Payer: Self-pay

## 2022-12-17 DIAGNOSIS — Z21 Asymptomatic human immunodeficiency virus [HIV] infection status: Secondary | ICD-10-CM

## 2022-12-17 DIAGNOSIS — Z23 Encounter for immunization: Secondary | ICD-10-CM | POA: Diagnosis not present

## 2022-12-17 MED ORDER — CABOTEGRAVIR & RILPIVIRINE ER 600 & 900 MG/3ML IM SUER
1.0000 | Freq: Once | INTRAMUSCULAR | Status: AC
Start: 2022-12-17 — End: 2022-12-17
  Administered 2022-12-17: 1 via INTRAMUSCULAR

## 2022-12-17 NOTE — Progress Notes (Signed)
HPI: Allen Espinoza is a 62 y.o. male who presents to the [facility] pharmacy clinic for [place] administration.  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) 08/19/2016   Morbid obesity (HCC) 08/19/2016   Solitary pulmonary nodule 05/06/2016   Hidradenitis suppurativa 04/27/2016   Chest pain 04/26/2016   Avascular necrosis of bone of hip, left s/p TRH (HCC) 03/19/2016   HIV (human immunodeficiency virus infection) (HCC) 03/18/2016   DVT (deep venous thrombosis) (HCC) 03/18/2016   Chronic kidney disease, stage 3a (HCC) 03/18/2016   Cholelithiasis 03/18/2016   Diverticulosis of colon without hemorrhage 03/18/2016    Patient's Medications  New Prescriptions   No medications on file  Previous Medications   AMLODIPINE (NORVASC) 10 MG TABLET    Take 1 tablet (10 mg total) by mouth daily.   CABOTEGRAVIR & RILPIVIRINE ER (CABENUVA) 600 & 900 MG/3ML INJECTION    Inject 1 kit into the muscle every 2 (two) months.   GABAPENTIN (NEURONTIN) 300 MG CAPSULE    TAKE 1 CAPSULE BY MOUTH THREE TIMES DAILY   HUMIRA PEN 80 MG/0.8ML PNKT    Inject 80 mg into the skin once a week. Tuesday's   METRONIDAZOLE (FLAGYL) 250 MG TABLET    Take 250 mg by mouth 3 (three) times daily.   OLMESARTAN (BENICAR) 20 MG TABLET    Take 1 tablet by mouth once daily   ROSUVASTATIN (CRESTOR) 10 MG TABLET    Take 1 tablet (10 mg total) by mouth daily.   TRIAMCINOLONE OINTMENT (KENALOG) 0.1 %    Apply 1 Application topically 2 (two) times daily.    XARELTO 20 MG TABS TABLET    Take 20 mg by mouth daily.  Modified Medications   No medications on file  Discontinued Medications   No medications on file    Allergies: Allergies  Allergen Reactions   Sulfa Antibiotics Other (See Comments)    Low blood pressure   Vancomycin Itching    Give with benadryl    Past Medical History: Past Medical History:  Diagnosis Date   Abscess    Between legs   Arthritis    all over   Asthma    Avascular necrosis of femoral head, left (HCC)    Blood transfusion without reported diagnosis    Cholelithiasis    CKD (chronic kidney disease) stage 3, GFR 30-59 ml/min (HCC)    sees kidney Dr.   [place] [address] disorder (HCC)    left DVT   Diverticulosis    DVT (deep venous thrombosis) (HCC)    legs   Dyspnea    when walking   Dysrhythmia    remembers mother taking about having an irregular rhythm years when he was a child    GERD (gastroesophageal reflux disease)    HIV (human immunodeficiency virus infection) (HCC)    Hypertension    Morbid obesity (HCC)     Social History: Social History   Socioeconomic History   Marital status: Single    Spouse name: Not on file  Number of children: 2   Years of education: Not on file   Highest education level: Not on file  Occupational History   Occupation: Disability  Tobacco Use   Smoking status: Never   Smokeless tobacco: Never  Vaping Use   Vaping status: Never Used  Substance and Sexual Activity   Alcohol use: No    Comment: prior   Drug use: No    Comment: prior cocaine use, last 2005   Sexual activity: Not Currently    Partners: Female    Birth control/protection: Condom    Comment: declined condoms 05/2020  Other Topics Concern   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, mailroom, warehouse work   Lives w/ fiancee   No EtOH, tobacco, drugs   Social Determinants of Health   Financial Resource Strain: Medium Risk (02/18/2022)   Overall  Financial Resource Strain (CARDIA)    Difficulty of Paying Living Expenses: Somewhat hard  Food Insecurity: No Food Insecurity (04/22/2022)   Hunger Vital Sign    Worried About Running Out of Food in the Last Year: Never true    Ran Out of Food in the Last Year: Never true  Transportation Needs: No Transportation Needs (08/29/2022)   PRAPARE - Administrator, Civil Service (Medical): No    Lack of Transportation (Non-Medical): No  Physical Activity: Insufficiently Active (12/10/2017)   Exercise Vital Sign    Days of Exercise per Week: 2 days    Minutes of Exercise per Session: 10 min  Stress: No Stress Concern Present (12/10/2017)   [employer] of Occupational Health - Occupational Stress Questionnaire    Feeling of Stress : Only a little  Social Connections: Somewhat Isolated (12/10/2017)   Social Connection and Isolation Panel [NHANES]    Frequency of Communication with Friends and Family: Three times a week    Frequency of Social Gatherings with Friends and Family: More than three times a week    Attends Religious Services: Never    Database administrator or Organizations: No    Attends Banker Meetings: Never    Marital Status: Living with partner    Labs: Lab Results  Component Value Date   HIV1RNAQUANT Not Detected 10/17/2022   HIV1RNAQUANT Not Detected 08/15/2022   HIV1RNAQUANT Not Detected 04/25/2022   HIV1RNAVL 47,300 03/18/2016   CD4TABS 441 10/17/2022   CD4TABS 434 04/25/2022   CD4TABS 441 06/21/2021    RPR and STI Lab Results  Component Value Date   LABRPR NON-REACTIVE 04/25/2022   LABRPR NON-REACTIVE 06/21/2021   LABRPR NON-REACTIVE 02/01/2020   LABRPR NON-REACTIVE 08/18/2018   LABRPR NON-REACTIVE 05/01/2017    STI Results GC CT  02/13/2021  9:52 AM Negative  Negative   02/01/2020 11:01 AM Negative  Negative   03/03/2019 12:00 AM Negative  C Negative  C  05/01/2017 12:00 AM Negative  Negative   08/07/2016 12:00 AM Negative   Negative   04/26/2016 12:00 AM Negative  Negative     C Corrected result    Hepatitis B Lab Results  Component Value Date   HEPBSAB REACTIVE (A) 11/19/2016   HEPBSAG NON-REACTIVE 11/19/2016   Hepatitis C No results found for: "HEPCAB", "HCVRNAPCRQN" Hepatitis A Lab Results  Component Value Date   HAV REACTIVE (A) 08/15/2022   Lipids: Lab Results  Component Value Date   CHOL 161 04/25/2022   TRIG 65 04/25/2022   HDL 67 04/25/2022   CHOLHDL 2.4 04/25/2022  VLDL 16 08/07/2016   LDLCALC 80 04/25/2022    TARGET DATE:  The 20th of the month  Assessment: Allen Espinoza presents today for their maintenance Cabenuva injections. Initial/past injections were tolerated well without issues. No problems with systemic effects of injections. Administered cabotegravir 600mg /101mL in left upper outer quadrant of the gluteal muscle. Administered rilpivirine 900 mg/15mL in the right upper outer quadrant of the gluteal muscle. Monitored patient for 10 minutes after injection. Injections were tolerated well without issue. Patient will follow up in 2 months for next injection. HIV RNA checked at last visit; will defer today.   Due for annual flu and updated COVID vaccines which were both administered.  States he is taking Crestor daily without any adverse events; started after his last visit given results from the REPRIEVE trial.   Plan: - Cabenuva injections administered - Administer flu and COVID vaccines  - Next injections scheduled for 11/13 with me, 1/20 with Tammy Sours, and 3/13 with me  - Call with any issues or questions  Margarite Gouge, PharmD, CPP, BCIDP, AAHIVP Clinical Pharmacist Practitioner Infectious Diseases Clinical Pharmacist [facility]

## 2022-12-22 NOTE — Unmapped (Unsigned)
Dermatology Note    Assessment and Plan:      Severe Hidradenitis suppurativa, Hurley 3, scrotal, inguinal and perirenal; requiring chronic management, improved on Humira, but flared with stopping metronidazole, in patient w/ hx of well controlled HIV ***   - We discussed the typical natural history, pathogenesis, treatment options, and expected course as well as the relapsing and sometimes recalcitrant nature of the disease.    - Previously discussed considering deroofing procedure for thighs and procedure for scrotal area with Reconstructive Urology, which patient deferred to both ***  - Reviewed role of biologic therapy including humira and remicade. Previously discussed increasing Humira dose to 80 mg weekly for better control, however, PA was denied.  - R medial buttock involvement is about 8x6cm interconnected tunnels. Scrotum is about 1/3 effected on posterior aspect as well as inguinal creases and could do well with deroofing approach, but if general surgery is hesitant to approach this site we can refer to [name] in reconstructive urology ***   - Based on discussion above and r/b/a reviewed, mutual decision to proceed with the following:  - Continue Humira 40mg  [place] weekly    - Continue metronidazole 250 mg TID. Counseled regarding risk of peripheral neuropathy with long-term use.  - Continue triamcinolone (KENALOG) 0.1 % ointment; Apply topically Two (2) times a day. To affected area(s) prn for dermatitis in the gluteal cleft, appropriate use discussed     High Risk Medication Use  - Quant gold negative 08/21/2021    The patient was advised to call for an appointment should any new, changing, or symptomatic lesions develop.     RTC: No follow-ups on file. or sooner as needed   _________________________________________________________________      Chief Complaint     No chief complaint on file.      HPI     Dustin Prince is a 62 y.o. male who presents as a returning patient (last seen by Dr. Janyth Contes on 06/03/2022) to Dermatology for follow up of hidradenitis suppurativa. At last visit, patient was to continue Humira 40 mg injections weekly, continue metronidazole 250 mg three times daily, and start triamcinolone 0.1% ointment for his HS.     Today, patient reports ***     Self-reported severity (0-5): {0-5:36085}  VAS pain today: {numbers 0-10:5044}  VAS average pain for the last month: {numbers 0-10:5044}  Requiring pain medication? {YES/NO:21013}  If so, what type/frequency? ***  How often in pain?  {hspain:53295}  Level of odor (0-5): {0-5 selection:53296}  Level of itching (0-5): {0-5 selection:53296}  Dressing changes needed for drainage:{hs dressings:52675}  How much drainage: {hsdrainage:52676}  Flare in the last month (Y/N)? {YES/NO:21013}  How long ago was the last flare? {last flare:53297}  Developing new lesions? {hsnewlesion:52677}  Number of inflammatory lesions montly: {number of lesions monthly:53298}  DLQI: ***  Current treatment: ***     How helpful is the current treatment in managing the following aspects of your disease?  Not at all helpful Somewhat helpful Very helpful   Pain      Decreasing length of flares      Decreasing new lesions      Drainage      Decreasing frequency of flares      Decreasing severity of flares      Odor        The patient denies any other new or changing lesions or areas of concern.     Pertinent Past Medical History     Social History:  Current or former smoker? never  Amount smoking: n/a  How many years: n/a  ED visits in the last 5 years? 6-10  Difficulty affording medications? most of the time  Marital Status: in relationship  Living with some one? Yes.     Prior treatments:  Topical: topical ABX  Systemic: clidnamycin; doxy (not very helpful),   Past surgical procedures: large WLE left axilla c/b dehisence   Past laser procedures: none    Past Medical History, Family History, Social History, Medication List, Allergies, and Problem List were reviewed in the rooming section of Epic.     ROS: Other than symptoms mentioned in the HPI, no fevers, chills, or other skin complaints    Physical Examination     Gen: Well-appearing patient, appropriate, interactive, in no acute distress  SKIN (Focal Skin Exam): Per patient request, examination of bilateral axillae, chest, abdomen, bilateral thighs, groin, buttocks, and external genitalia was performed    location Abscess Inflamed nodule Non-inflamed nodule Draining sinus Non-draining Sinus Hurley % scar   R axilla          L axilla          R inframammary          L inframammary          Intermammary          Pubic          R inguinal          R thigh          L inguinal          L thigh          Scrotum/Vulva          Perianal          R buttock          L buttock          Other (list)                      AN count (total sum of abscess and inflammatory nodule): ***  Pilonidal sinus? No    - Sites not commented on demonstrate normal findings.    Scribe's Attestation: [name], MD obtained and performed the history, physical exam and medical decision making elements that were entered into the chart.  Signed by [name], Scribe, on ***.    {*** NOTE TO PROVIDER: PLEASE ADD ATTESTATION NOTING YOU AGREE WITH SCRIBE DOCUMENTATION}     (Approved Template 12/13/2019)

## 2022-12-22 NOTE — Unmapped (Incomplete)
For your HS:    Inject 1 pen (40 mg) of Humira under the skin weekly.     Continue taking metronidazole 250 mg tablet three times a day.     Continue applying triamcinolone 0.1% ointment two times a day to areas in buttocks until skin is smooth. Restart as necessary.     You are welcome to join the [facility] for HS of the [employer] group online at [email or web address] or in-person at our regular meetings.  You can textHS to [phone] for meeting reminders or join the group online for regular updates.  This can be a great opportunity to interact and learn from other patients and help work with the HS community.  We hope to see your there!    Hidradentis Suppurativa (pronounced ???high-drad-en-eye-tis/sup-your-uh-tee-vah???) is a chronic disease of hair follicles.  The lesions occur most commonly on areas of skin-to-skin contact: under the arms (axillary area), in the groin, around the buttocks, in the region around the anus and genitals, and on the skin between and under the breasts. In women, the underarms, groin, and breast areas are most commonly affected. Men most often have HS lesions on the buttocks and under the arms and may also have HS at the back of the neck and behind and around the ears.    What does HS look and feel like?   The first thing that someone with HS notices is a tender, raised, red bump that looks like an under-the-skin pimple or boil. Sometimes HS lesions have two or more ???heads.???  In mild disease only an occasional boil or abscess may occur, but in more active disease there can be many new lesions every month.  Some abscesses can become larger and may open and drain pus.  Bleeding and increased odor can also occur. In severe disease, deeper abscesses develop and may connect with each other under the skin to form tunnel-like tracts (sinuses, fistulas).  These may drain constantly, or may temporarily improve and then usually begin draining again over time.  In people who have had sinus tracts for some time, scars form that feel like ropes under the skin. In the very worst cases, networks of sinus tracts can form deeper in the body, including the muscle and other tissues. Many people with severe HS have scars that can limit their ability to freely move their arms or legs, though this is very unlikely for most patients.     Clinicians usually classify or ???grade??? HS using the [facility] staging system according to the severity of the disease for each body location:   G[place] stage I: one or more abscesses are present, but no sinus tracts have formed and no scars have developed   Doreene Adas stage II: one or more abscesses are present that resolve and recur; on sinus tract can be present and scarring is seen   Doreene Adas stage III: many abscesses and more than one sinus tract is present with extensive scars.    What causes HS?  The cause of HS is not completely understood.  It seems to be a disorder of hair follicles and often many family members are affected so genetics probably play a strong role.  Bacteria are often present and may make the disease worse, but infection does not seem to be the main cause. Hormones are also likely play a role since the condition typically starts around puberty when hair follicles under the arms and in the groin start to change.  It can sometimes flare with  menstrual cycles in women as well.  In most cases it lasts for decades and starts to improve to some extent in the late 30s and 40s as long as many fistulas have not already formed.  Women are three times more likely than men to develop HS.    Other factors are known to contribute to HS flaring or becoming worse, though they are likely not the main causes. The factors most commonly associated with HS include:   Cigarette smoking - Stopping smoking will likely not cure the disease, but likely is helpful in reducing how much and how often it flares and may prevent it from getting as bad over time.   Higher weight - HS may occur even in people that are not overweight, but it is much more common in patients that are.  There is some evidence that losing weight and eating a diet low in sugars and fats may be helpful in improving hidradenitis, though this is not helpful for everyone.  Working with a nutritionist may be an important way to help with this and is something your physician can help coordinate    Hidradenitis is not contagious.  It is not caused by a problem with personal hygiene or any other activity or behavior of those with the disease.    How can your doctor help you treat your hidradenitis?  Clinicians use both medication and surgery to treat HS. The choice of treatment--or combination of treatments--is made according to an individual patient???s needs. Clinicians consider several factors in determining the most appropriate plan for therapy:   Severity of disease - medications and some laser treatments are usually able to control disease best when fistulas are not present.  Fistulas typically require surgery.   Extent and location of disease   Chronicity (how often the lesions recur)    A number of different surgical methods have been developed that are useful for certain patients under particular circumstances. These can be done with local numbing and healing at home for some areas when disease is not too extensive with relatively brief recovery times.  In more extensive disease there may be a need for larger excisions under general anesthesia with healing time in the hospital and prolonged recovery periods for better disease control.      In addition, many medical treatments have been tried--some with more success than others. No medication is effective for all patients, and you and your doctor may have to try several different treatments or combinations of treatments before you find the treatment plan that works best for you.  The goals of therapy with medications that are either topical (used on the skin) or systemic (taken by mouth) are:  1. to clear the lesions or at least reduce their number and extent, and  2. to prevent new lesions from forming.  3. To reduce pain, drainage, and odor  Some of the types of medications commonly used are antibacterial skin washes and the topical antibiotics to prevent secondary infections and corticosteroid injections into the lesions to reduce inflammation.     Other medications that may be used include retinoids (similar to Accutane), drugs that effect how hormones and hair follicles interact, drugs that affect your immune system (such as methotrexate, adalimumab/Humira, and Remicaid/infliximab), steroids, and oral antibiotics.    Lasers that destroy hair follicles can also be helpful since they reduce the hair follicles that cause the problems.  Multiple treatments are typically required over time and there is some discomfort associated with treatment,  but it is typically very fast and well-tolerated.    It is very important to realize that hidradenitis cannot usually be completely cured with any single medication or surgical procedure.  It is a disease that can be very stubborn and difficult to control, but with good treatment a lot of improvement and sometimes temporary remissions can be obtained. Poorly controlled disease can cause more fistulas to form and make managing the disease much more difficult over time so it is important to seek care to reduce major flares.  Surgery can provide a long term cure in some areas, though the disease can start again or continue in nearby areas.  A dermatologist is often the best person to help coordinate disease treatment, and sometimes other surgeons, pain specialists, other specialists, and nutritionists may be part of the treatment team.    For severe disease, the first goal is often to reduce pain and symptoms with medicines so that the disease feels more stable. Once it's stable, we often start thinking about how to address areas that have completely gotten better with surgery if they are still causing problems.    What can you do to help your HS?  1. Stopping smoking is hard and may not fix everything, but it may be a step in the right direction.  We or your primary care physician can provide resources to help stop if you are interested.  2. Follow a healthy diet and try to achieve a healthy weight.  Some other self-help measures are:   Keep your skin cool and dry (becoming overheated and sweating can contribute to an HS flare)   To reduce the pain of cysts or nodules or to help them to drain, apply hot compresses or soak in hot water for 10 minutes at a time (use a clean washcloth or a teabag soaked in hot water)   For male patients, cotton underwear that does not have tight elastic in the groin can be helpful.  Boyshort, brief, or boxer style underwear may be a better option as friction on hair follicles in affected areas can be a major trigger in some patients.  These can be easily found on [place] or with some retailers.  Fruit of the Loom and Underworks are two brands that are sometimes recommended.    Finally, know that you are not alone. Coping with the pain and other symptoms of HS can be very difficult, so it may be helpful to connect with others who live with HS. Patient groups and networks can be sources of important information and support. Some internet resources for information and connections are provided below.    Psychologytoday.com is a resource to find psychologists and therapists that can help support you in your are     [email or web address].tn can help connect with sexual health resources and counselors    Resources for Information    The Hidradenitis Suppurativa Foundation: A nonprofit organized by a group of physicians interested in treating and advancing research in hidradenitis suppurativa.  This group advocates for better care and research for hidradenitis and has educational materials put together specifically for patients that have been reviewed and produced by doctors and people with hidradenitis.    American Academy of Dermatology  ARanked.fi    [employer]  ElevatorPitchers.de.html  NORD: IT trainer for [employer]  https://www.rarediseases.org/rare-disease-information/rare-diseases/byID/358/viewAbstract  Trials of new medications for HS  Https://www.clinicaltrials.gov

## 2023-01-03 DIAGNOSIS — L732 Hidradenitis suppurativa: Principal | ICD-10-CM

## 2023-01-03 MED ORDER — METRONIDAZOLE 250 MG TABLET
ORAL_TABLET | Freq: Three times a day (TID) | ORAL | 0 refills | 0.00000 days
Start: 2023-01-03 — End: ?

## 2023-01-03 NOTE — Unmapped (Signed)
Rx for Metronidazole is pended for you to review and sign, patient was last seen in clinic on 06/03/2022.

## 2023-01-06 MED ORDER — METRONIDAZOLE 250 MG TABLET
ORAL_TABLET | Freq: Three times a day (TID) | ORAL | 0 refills | 30.00000 days | Status: CP
Start: 2023-01-06 — End: ?

## 2023-01-08 NOTE — Unmapped (Signed)
[facility] Refill Coordination Note    Specialty Medication(s) to be Shipped:   Inflammatory Disorders: Humira    Other medication(s) to be shipped: No additional medications requested for fill at this time     Dustin Prince, DOB: July 21, 1960  Phone: [phone] (home)       All above HIPAA information was verified with patient.     Was a Nurse, learning disability used for this call? No    Completed refill call assessment today to schedule patient's medication shipment from the [facility] [phone]).  All relevant notes have been reviewed.     Specialty medication(s) and dose(s) confirmed: Regimen is correct and unchanged.   Changes to medications: Dustin Prince reports no changes at this time.  Changes to insurance: No  New side effects reported not previously addressed with a pharmacist or physician: None reported  Questions for the pharmacist: No    Confirmed patient received a Conservation officer, historic buildings and a Surveyor, mining with first shipment. The patient will receive a drug information handout for each medication shipped and additional FDA Medication Guides as required.       DISEASE/MEDICATION-SPECIFIC INFORMATION        For patients on injectable medications: Patient currently has 1 doses left.  Next injection is scheduled for 10/10.    SPECIALTY MEDICATION ADHERENCE     Medication Adherence    Patient reported X missed doses in the last month: 0  Specialty Medication: ADALIMUMAB PEN CITRATE FREE 40 MG/0.4 ML  Patient is on additional specialty medications: No  Informant: patient  Support network for adherence: family member              Were doses missed due to medication being on hold? No    Humira 40/0.4 mg/ml: 1 days of medicine on hand       REFERRAL TO PHARMACIST     Referral to the pharmacist: Not needed      [facility]     Shipping address confirmed in Epic.     Patient was notified of new phone menu : Yes    Delivery Scheduled: Yes, Expected medication delivery date: 10/16. Medication will be delivered via UPS to the prescription address in Epic WAM.    Dustin Prince   [facility] Specialty Technician

## 2023-01-14 MED FILL — HUMIRA PEN CITRATE FREE 40 MG/0.4 ML: SUBCUTANEOUS | 28 days supply | Qty: 4 | Fill #3

## 2023-01-30 ENCOUNTER — Other Ambulatory Visit (HOSPITAL_COMMUNITY): Payer: Self-pay

## 2023-02-03 ENCOUNTER — Other Ambulatory Visit: Payer: Self-pay

## 2023-02-03 ENCOUNTER — Other Ambulatory Visit (HOSPITAL_COMMUNITY): Payer: Self-pay

## 2023-02-03 NOTE — Progress Notes (Signed)
Specialty Pharmacy Refill Coordination Note  Allen Espinoza is a 62 y.o. male assessed today regarding refills of clinic administered specialty medication(s) Cabotegravir & Rilpivirine   Clinic requested Courier to Provider Office   Delivery date: 02/07/23   Verified address: [facility] [address] [place] [address]   Medication will be filled on 02/06/23.

## 2023-02-06 ENCOUNTER — Other Ambulatory Visit: Payer: Self-pay

## 2023-02-06 DIAGNOSIS — L732 Hidradenitis suppurativa: Principal | ICD-10-CM

## 2023-02-06 MED ORDER — METRONIDAZOLE 250 MG TABLET
ORAL_TABLET | Freq: Three times a day (TID) | ORAL | 0 refills | 30.00000 days | Status: CP
Start: 2023-02-06 — End: ?

## 2023-02-07 ENCOUNTER — Telehealth: Payer: Self-pay

## 2023-02-07 NOTE — Telephone Encounter (Signed)
 [facility] Patient Advocate Encounter  Patient's medications CABENUVA have been couriered to [facility] from [facility] and will be administered at the patients appointment on 02/12/23.  Kae Heller, CPhT Specialty Pharmacy Patient [facility] Phone: [phone] Fax:  [phone]

## 2023-02-08 NOTE — Unmapped (Signed)
[facility] Refill Coordination Note    Specialty Medication(s) to be Shipped:   Inflammatory Disorders: Humira    Other medication(s) to be shipped: No additional medications requested for fill at this time     [name], DOB: 04-May-1960  Phone: There are no phone numbers on file.      All above HIPAA information was verified with patient.     Was a Nurse, learning disability used for this call? No    Completed refill call assessment today to schedule patient's medication shipment from the [facility] [phone]).  All relevant notes have been reviewed.     Specialty medication(s) and dose(s) confirmed: Regimen is correct and unchanged.   Changes to medications: Jonovan reports no changes at this time.  Changes to insurance: No  New side effects reported not previously addressed with a pharmacist or physician: None reported  Questions for the pharmacist: No    Confirmed patient received a Conservation officer, historic buildings and a Surveyor, mining with first shipment. The patient will receive a drug information handout for each medication shipped and additional FDA Medication Guides as required.       DISEASE/MEDICATION-SPECIFIC INFORMATION        For patients on injectable medications: Patient currently has 1 doses left.  Next injection is scheduled for 02/13/23.    SPECIALTY MEDICATION ADHERENCE     Medication Adherence    Patient reported X missed doses in the last month: 0  Specialty Medication: ADALIMUMAB (Humira) PEN CITRATE FREE 40 MG/0.4 ML  Patient is on additional specialty medications: No  Informant: patient  Support network for adherence: family member              Were doses missed due to medication being on hold? No    Humira 40/0.4 mg/ml: 6 days of medicine on hand       REFERRAL TO PHARMACIST     Referral to the pharmacist: Not needed      [facility]     Shipping address confirmed in Epic.     Patient was notified of new phone menu : Yes    Delivery Scheduled: Yes, Expected medication delivery date: 11/13.     Medication will be delivered via UPS to the prescription address in Epic WAM.    [name]   [facility] Specialty Technician

## 2023-02-10 ENCOUNTER — Other Ambulatory Visit (HOSPITAL_COMMUNITY): Payer: Self-pay

## 2023-02-10 DIAGNOSIS — I10 Essential (primary) hypertension: Secondary | ICD-10-CM | POA: Diagnosis not present

## 2023-02-10 DIAGNOSIS — L732 Hidradenitis suppurativa: Principal | ICD-10-CM

## 2023-02-10 MED ORDER — HUMIRA(CF) PEN 80 MG/0.8 ML SUBCUTANEOUS KIT
SUBCUTANEOUS | 3 refills | 84.00000 days
Start: 2023-02-10 — End: ?

## 2023-02-11 DIAGNOSIS — I10 Essential (primary) hypertension: Secondary | ICD-10-CM | POA: Diagnosis not present

## 2023-02-11 MED ORDER — HUMIRA(CF) PEN 80 MG/0.8 ML SUBCUTANEOUS KIT
SUBCUTANEOUS | 3 refills | 84.00000 days | Status: CP
Start: 2023-02-11 — End: ?
  Filled 2023-02-11: qty 4, 28d supply, fill #4

## 2023-02-11 NOTE — Progress Notes (Unsigned)
HPI: Allen Espinoza is a 62 y.o. male who presents to the [facility] pharmacy clinic for [place] administration.  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) 08/19/2016   Morbid obesity (HCC) 08/19/2016   Solitary pulmonary nodule 05/06/2016   Hidradenitis suppurativa 04/27/2016   Chest pain 04/26/2016   Avascular necrosis of bone of hip, left s/p TRH (HCC) 03/19/2016   HIV (human immunodeficiency virus infection) (HCC) 03/18/2016   DVT (deep venous thrombosis) (HCC) 03/18/2016   Chronic kidney disease, stage 3a (HCC) 03/18/2016   Cholelithiasis 03/18/2016   Diverticulosis of colon without hemorrhage 03/18/2016    Patient's Medications  New Prescriptions   No medications on file  Previous Medications   AMLODIPINE (NORVASC) 10 MG TABLET    Take 1 tablet (10 mg total) by mouth daily.   CABOTEGRAVIR & RILPIVIRINE ER (CABENUVA) 600 & 900 MG/3ML INJECTION    Inject 1 kit into the muscle every 2 (two) months.   GABAPENTIN (NEURONTIN) 300 MG CAPSULE    TAKE 1 CAPSULE BY MOUTH THREE TIMES DAILY   HUMIRA PEN 80 MG/0.8ML PNKT    Inject 80 mg into the skin once a week. Tuesday's   METRONIDAZOLE (FLAGYL) 250 MG TABLET    Take 250 mg by mouth 3 (three) times daily.   OLMESARTAN (BENICAR) 20 MG TABLET    Take 1 tablet by mouth once daily   ROSUVASTATIN (CRESTOR) 10 MG TABLET    Take 1 tablet (10 mg total) by mouth daily.   TRIAMCINOLONE OINTMENT (KENALOG) 0.1 %    Apply 1 Application topically 2 (two) times daily.    XARELTO 20 MG TABS TABLET    Take 20 mg by mouth daily.  Modified Medications   No medications on file  Discontinued Medications   No medications on file    Allergies: Allergies  Allergen Reactions   Sulfa Antibiotics Other (See Comments)    Low blood pressure   Vancomycin Itching    Give with benadryl    Labs: Lab Results  Component Value Date   HIV1RNAQUANT Not Detected 10/17/2022   HIV1RNAQUANT Not Detected 08/15/2022   HIV1RNAQUANT Not Detected 04/25/2022   HIV1RNAVL 47,300 03/18/2016   CD4TABS 441 10/17/2022   CD4TABS 434 04/25/2022   CD4TABS 441 06/21/2021    RPR and STI Lab Results  Component Value Date   LABRPR NON-REACTIVE 04/25/2022   LABRPR NON-REACTIVE 06/21/2021   LABRPR NON-REACTIVE 02/01/2020   LABRPR NON-REACTIVE 08/18/2018   LABRPR NON-REACTIVE 05/01/2017    STI Results GC CT  02/13/2021  9:52 AM Negative  Negative   02/01/2020 11:01 AM Negative  Negative   03/03/2019 12:00 AM Negative  C Negative  C  05/01/2017 12:00 AM Negative  Negative   08/07/2016 12:00 AM Negative  Negative   04/26/2016 12:00 AM Negative  Negative     C Corrected result    Hepatitis B Lab Results  Component Value Date   HEPBSAB REACTIVE (A) 11/19/2016   HEPBSAG NON-REACTIVE 11/19/2016   Hepatitis C No results  found for: "HEPCAB", "HCVRNAPCRQN" Hepatitis A Lab Results  Component Value Date   HAV REACTIVE (A) 08/15/2022   Lipids: Lab Results  Component Value Date   CHOL 161 04/25/2022   TRIG 65 04/25/2022   HDL 67 04/25/2022   CHOLHDL 2.4 04/25/2022   VLDL 16 08/07/2016   LDLCALC 80 04/25/2022    TARGET DATE: The 20th of the month  Assessment: [name] presents today for their maintenance Cabenuva injections. Past injections were tolerated well without issues. His last provider visit was on 10/17/22 with Marcos Eke, NP. He has follow up/labs scheduled on 04/21/23 with Tammy Sours. He remains undetectable, with CD4 count in the 400s. He declines STI testing  today. Reports he is not currently sexually active.  Immunizations: [name] is up to date on his immunizations today.  Administered cabotegravir 600mg /23mL in left upper outer quadrant of the gluteal muscle. Administered rilpivirine 900 mg/25mL in the right upper outer quadrant of the gluteal muscle. No issues with injections. [name] will follow up in 2 months for next set of injections.  Plan: - Cabenuva injections administered - Next injections scheduled for 04/21/23 with Tammy Sours and 06/12/23 with Marchelle Folks - Call with any issues or questions  [name], PharmD PGY-2 Infectious Diseases Pharmacy Resident [facility] 02/12/2023 9:01 AM

## 2023-02-12 ENCOUNTER — Ambulatory Visit: Payer: Medicaid Other | Admitting: Pharmacist

## 2023-02-12 ENCOUNTER — Other Ambulatory Visit: Payer: Self-pay

## 2023-02-12 DIAGNOSIS — B20 Human immunodeficiency virus [HIV] disease: Secondary | ICD-10-CM | POA: Diagnosis present

## 2023-02-12 DIAGNOSIS — Z21 Asymptomatic human immunodeficiency virus [HIV] infection status: Secondary | ICD-10-CM

## 2023-02-12 MED ORDER — CABOTEGRAVIR & RILPIVIRINE ER 600 & 900 MG/3ML IM SUER
1.0000 | Freq: Once | INTRAMUSCULAR | Status: AC
Start: 2023-02-12 — End: 2023-02-12
  Administered 2023-02-12: 1 via INTRAMUSCULAR

## 2023-02-13 DIAGNOSIS — I10 Essential (primary) hypertension: Secondary | ICD-10-CM | POA: Diagnosis not present

## 2023-02-24 DIAGNOSIS — R42 Dizziness and giddiness: Secondary | ICD-10-CM | POA: Diagnosis not present

## 2023-02-26 NOTE — Unmapped (Signed)
Dermatology Note    Assessment and Plan:      Severe Hidradenitis suppurativa, Hurley Stage 3, scrotal, inguinal and perirenal; requiring chronic management, stable, well-controlled on Humira, in patient w/ hx of well controlled HIV    - We discussed the typical natural history, pathogenesis, treatment options, and expected course as well as the relapsing and sometimes recalcitrant nature of the disease.    - Reviewed role of biologic therapy including remicade vs cosentyx vs bimzelx  - Reviewed role of localized surgical procedures such as ILK, punch excisions, unroofing procedures and compared to surgical interventions by plastic surgery in the operating room. Discussed considering deroofing procedure for groin and procedure for scrotal area with Reconstructive Urology -referral to Dr. Guy Sandifer today.  - Based on discussion above and r/b/a reviewed, mutual decision to proceed with the following:  - Continue Humira 40mg  [address] weekly    - Continue metronidazole 250 mg TID. Counseled regarding risk of peripheral neuropathy with long-term use.  - Continue triamcinolone 0.1 % ointment; Apply twice daily to affected areas as needed for dermatitis in the gluteal cleft. We reviewed proper use and side effects of topical steroids including striae and cutaneous atrophy.   - Will send referral to Reconstructive Urology      High Risk Medication Use (humira)  - Quant gold negative on 08/21/2021. Recheck at [facility] in next couple of weeks.    The patient was advised to call for an appointment should any new, changing, or symptomatic lesions develop.     RTC: Return in about 6 months (around 09/01/2023) for follow up of HS. or sooner as needed   _________________________________________________________________      Chief Complaint     Chief Complaint   Patient presents with    Hidradenitis suppurativa     Pt coming in for HS follow up.  Current flares inner thighs.        HPI     Dustin Prince is a 62 y.o. male who presents as a returning patient (last seen by Dr. Janyth Contes on 06/03/2022) to Dermatology for follow up of hidradenitis suppurativa. At last visit, patient was to continue humira 40 mg weekly, continue metronidazole 250 mg three times daily, and start triamcinolone 0.1% ointment for his hidradenitis suppurativa.    Patient reports stability of his HS since last visit. Endorses occasional flares on his scrotum and buttocks. Endorses drainage which patient notes is his primary concern. Denies tenderness, except for when he misses a dose of humira. Currently doing humira 40 mg every week with improvement. Notes that he recently had a lesion on his buttock lanced with relief.     Self-reported severity (0-5): 3  VAS pain today: 4  VAS average pain for the last month: 4  Requiring pain medication? No.  If so, what type/frequency? N/A  How often in pain?  few times a month  Level of odor (0-5): 4  Level of itching (0-5): 1  Dressing changes needed for drainage:Twice a day  How much drainage: some drainage  Flare in the last month (Y/N)? Yes.  How long ago was the last flare? in last 6 months  Developing new lesions? once a month  Number of inflammatory lesions montly: 1-3  DLQI: 27  Current treatment: humira, metronidazole, triamcinolone     How helpful is the current treatment in managing the following aspects of your disease?  Not at all helpful Somewhat helpful Very helpful   Pain   x     Decreasing  length of flares   x     Decreasing new lesions   x     Drainage x       Decreasing frequency of flares   x     Decreasing severity of flares   x     Odor           The patient denies any other new or changing lesions or areas of concern.     Pertinent Past Medical History     Social History:  Current or former smoker? never  Amount smoking: n/a  How many years: n/a  ED visits in the last 5 years? 6-10  Difficulty affording medications? most of the time  Marital Status: in relationship  Living with some one? Yes.     Prior treatments:  Topical: topical ABX  Systemic: clidnamycin; doxy (not very helpful),   Past surgical procedures: large WLE left axilla c/b dehisence   Past laser procedures: none    Hx of well controlled HIV     Past Medical History, Family History, Social History, Medication List, Allergies, and Problem List were reviewed in the rooming section of Epic.     ROS: Other than symptoms mentioned in the HPI, no fevers, chills, or other skin complaints    Physical Examination     Gen: Well-appearing patient, appropriate, interactive, in no acute distress  SKIN (Focal Skin Exam): Per patient request, examination of bilateral axillae, chest, abdomen, bilateral thighs, groin, buttocks, and external genitalia was performed    location Abscess Inflamed nodule Non-inflamed nodule Draining sinus Non-draining Sinus Hurley % scar   R axilla                 L axilla                 R inframammary                 L inframammary                 Intermammary                 Pubic                 R inguinal         2       R thigh                 L inguinal   1   1         L thigh                 Scrotum   1   1 1        Perianal                 R buttock   3   1 1        L buttock                 Other (list)                                      AN count (total sum of abscess and inflammatory nodule): 5  Pilonidal sinus? No    -sites not commented on demonstrate normal findings.    Scribe's Attestation: [name], MD obtained and performed the history, physical exam and medical decision making elements that  were entered into the chart.  Signed by [name], Scribe, on March 03, 2023 at 9:13 AM.    ----------------------------------------------------------------------------------------------------------------------  March 03, 2023 12:41 PM. Documentation assistance provided by the Scribe. I was present during the time the encounter was recorded. The information recorded by the Scribe was done at my direction and has been reviewed and validated by me.  ----------------------------------------------------------------------------------------------------------------------       (Approved Template 12/13/2019)

## 2023-02-26 NOTE — Unmapped (Addendum)
Meet your team:     Your intake nurse is: Verne Carrow     Please remember to fill out the survey you will receive after your visit. Your comments help [place] continue to improve our care.      Thanks in advance!      [facility] Dermatology Clinical Staff     Please have your labs done at [facility].    For your HS:    Inject the contents of 1 humira pen (40 mg) under the skin every week.    Take 1 tablet (250 mg) of metronidazole three times a day.    Apply triamcinolone 0.1% ointment to inflamed HS lesions and affected areas of itchy rash in your gluteal cleft until improved.    You are welcome to join the [facility] for HS of the [employer] group online at [email or web address] or in-person at our regular meetings.  You can textHS to [phone] for meeting reminders or join the group online for regular updates.  This can be a great opportunity to interact and learn from other patients and help work with the HS community.  We hope to see your there!    Hidradentis Suppurativa (pronounced ???high-drad-en-eye-tis/sup-your-uh-tee-vah???) is a chronic disease of hair follicles.  The lesions occur most commonly on areas of skin-to-skin contact: under the arms (axillary area), in the groin, around the buttocks, in the region around the anus and genitals, and on the skin between and under the breasts. In women, the underarms, groin, and breast areas are most commonly affected. Men most often have HS lesions on the buttocks and under the arms and may also have HS at the back of the neck and behind and around the ears.    What does HS look and feel like?   The first thing that someone with HS notices is a tender, raised, red bump that looks like an under-the-skin pimple or boil. Sometimes HS lesions have two or more ???heads.???  In mild disease only an occasional boil or abscess may occur, but in more active disease there can be many new lesions every month.  Some abscesses can become larger and may open and drain pus.  Bleeding and increased odor can also occur. In severe disease, deeper abscesses develop and may connect with each other under the skin to form tunnel-like tracts (sinuses, fistulas).  These may drain constantly, or may temporarily improve and then usually begin draining again over time.  In people who have had sinus tracts for some time, scars form that feel like ropes under the skin. In the very worst cases, networks of sinus tracts can form deeper in the body, including the muscle and other tissues. Many people with severe HS have scars that can limit their ability to freely move their arms or legs, though this is very unlikely for most patients.     Clinicians usually classify or ???grade??? HS using the [facility] staging system according to the severity of the disease for each body location:   Katie stage I: one or more abscesses are present, but no sinus tracts have formed and no scars have developed   Doreene Adas stage II: one or more abscesses are present that resolve and recur; on sinus tract can be present and scarring is seen   Doreene Adas stage III: many abscesses and more than one sinus tract is present with extensive scars.    What causes HS?  The cause of HS is not completely understood.  It seems to be a disorder  of hair follicles and often many family members are affected so genetics probably play a strong role.  Bacteria are often present and may make the disease worse, but infection does not seem to be the main cause. Hormones are also likely play a role since the condition typically starts around puberty when hair follicles under the arms and in the groin start to change.  It can sometimes flare with menstrual cycles in women as well.  In most cases it lasts for decades and starts to improve to some extent in the late 30s and 40s as long as many fistulas have not already formed.  Women are three times more likely than men to develop HS.    Other factors are known to contribute to HS flaring or becoming worse, though they are likely not the main causes. The factors most commonly associated with HS include:   Cigarette smoking - Stopping smoking will likely not cure the disease, but likely is helpful in reducing how much and how often it flares and may prevent it from getting as bad over time.   Higher weight - HS may occur even in people that are not overweight, but it is much more common in patients that are.  There is some evidence that losing weight and eating a diet low in sugars and fats may be helpful in improving hidradenitis, though this is not helpful for everyone.  Working with a nutritionist may be an important way to help with this and is something your physician can help coordinate    Hidradenitis is not contagious.  It is not caused by a problem with personal hygiene or any other activity or behavior of those with the disease.    How can your doctor help you treat your hidradenitis?  Clinicians use both medication and surgery to treat HS. The choice of treatment--or combination of treatments--is made according to an individual patient???s needs. Clinicians consider several factors in determining the most appropriate plan for therapy:   Severity of disease - medications and some laser treatments are usually able to control disease best when fistulas are not present.  Fistulas typically require surgery.   Extent and location of disease   Chronicity (how often the lesions recur)    A number of different surgical methods have been developed that are useful for certain patients under particular circumstances. These can be done with local numbing and healing at home for some areas when disease is not too extensive with relatively brief recovery times.  In more extensive disease there may be a need for larger excisions under general anesthesia with healing time in the hospital and prolonged recovery periods for better disease control.      In addition, many medical treatments have been tried--some with more success than others. No medication is effective for all patients, and you and your doctor may have to try several different treatments or combinations of treatments before you find the treatment plan that works best for you.  The goals of therapy with medications that are either topical (used on the skin) or systemic (taken by mouth) are:  1. to clear the lesions or at least reduce their number and extent, and  2. to prevent new lesions from forming.  3. To reduce pain, drainage, and odor  Some of the types of medications commonly used are antibacterial skin washes and the topical antibiotics to prevent secondary infections and corticosteroid injections into the lesions to reduce inflammation.     Other medications that  may be used include retinoids (similar to Accutane), drugs that effect how hormones and hair follicles interact, drugs that affect your immune system (such as methotrexate, adalimumab/Humira, and Remicaid/infliximab), steroids, and oral antibiotics.    Lasers that destroy hair follicles can also be helpful since they reduce the hair follicles that cause the problems.  Multiple treatments are typically required over time and there is some discomfort associated with treatment, but it is typically very fast and well-tolerated.    It is very important to realize that hidradenitis cannot usually be completely cured with any single medication or surgical procedure.  It is a disease that can be very stubborn and difficult to control, but with good treatment a lot of improvement and sometimes temporary remissions can be obtained. Poorly controlled disease can cause more fistulas to form and make managing the disease much more difficult over time so it is important to seek care to reduce major flares.  Surgery can provide a long term cure in some areas, though the disease can start again or continue in nearby areas.  A dermatologist is often the best person to help coordinate disease treatment, and sometimes other surgeons, pain specialists, other specialists, and nutritionists may be part of the treatment team.    For severe disease, the first goal is often to reduce pain and symptoms with medicines so that the disease feels more stable. Once it's stable, we often start thinking about how to address areas that have completely gotten better with surgery if they are still causing problems.    What can you do to help your HS?  1. Stopping smoking is hard and may not fix everything, but it may be a step in the right direction.  We or your primary care physician can provide resources to help stop if you are interested.  2. Follow a healthy diet and try to achieve a healthy weight.  Some other self-help measures are:   Keep your skin cool and dry (becoming overheated and sweating can contribute to an HS flare)   To reduce the pain of cysts or nodules or to help them to drain, apply hot compresses or soak in hot water for 10 minutes at a time (use a clean washcloth or a teabag soaked in hot water)   For male patients, cotton underwear that does not have tight elastic in the groin can be helpful.  Boyshort, brief, or boxer style underwear may be a better option as friction on hair follicles in affected areas can be a major trigger in some patients.  These can be easily found on [place] or with some retailers.  Fruit of the Loom and Underworks are two brands that are sometimes recommended.    Finally, know that you are not alone. Coping with the pain and other symptoms of HS can be very difficult, so it may be helpful to connect with others who live with HS. Patient groups and networks can be sources of important information and support. Some internet resources for information and connections are provided below.    Psychologytoday.com is a resource to find psychologists and therapists that can help support you in your are     [email or web address] can help connect with sexual health resources and counselors    Resources for Information    The Hidradenitis Suppurativa Foundation: A nonprofit organized by a group of physicians interested in treating and advancing research in hidradenitis suppurativa.  This group advocates for better care and research for hidradenitis and has Transport planner  put together specifically for patients that have been reviewed and produced by doctors and people with hidradenitis.    American Academy of Dermatology  ARanked.fi    [employer]  ElevatorPitchers.de.html  [place]ORD: IT trainer for Rare [employer]  https://www.rarediseases.org/rare-disease-information/rare-diseases/byID/358/viewAbstract  Trials of new medications for HS  Https://www.clinicaltrials.gov

## 2023-03-03 ENCOUNTER — Ambulatory Visit: Admit: 2023-03-03 | Discharge: 2023-03-04 | Payer: BLUE CROSS/BLUE SHIELD

## 2023-03-03 DIAGNOSIS — Z79899 Other long term (current) drug therapy: Secondary | ICD-10-CM | POA: Diagnosis not present

## 2023-03-03 DIAGNOSIS — L732 Hidradenitis suppurativa: Secondary | ICD-10-CM | POA: Diagnosis not present

## 2023-03-03 DIAGNOSIS — Z21 Asymptomatic human immunodeficiency virus [HIV] infection status: Secondary | ICD-10-CM | POA: Diagnosis not present

## 2023-03-03 NOTE — Unmapped (Deleted)
Self-reported severity (0-5): 3  VAS pain today: 4  VAS average pain for the last month: 4  Requiring pain medication? No.  If so, what type/frequency? ***  How often in pain?  few times a month  Level of odor (0-5): 4  Level of itching (0-5): 1  Dressing changes needed for drainage:Twice a day  How much drainage: some drainage  Flare in the last month (Y/N)? Yes.  How long ago was the last flare? {last flare:53297}  Developing new lesions? once a month  Number of inflammatory lesions montly: 1-3  DLQI: 27  Current treatment: ***     How helpful is the current treatment in managing the following aspects of your disease?  Not at all helpful Somewhat helpful Very helpful   Pain  x    Decreasing length of flares  x    Decreasing new lesions  x    Drainage x     Decreasing frequency of flares  x    Decreasing severity of flares  xx    Odor

## 2023-03-06 NOTE — Unmapped (Unsigned)
Assessment:  ***    Plan:  ***    HPI:   62 y.o. male     Initial visit 10/11/21  Hidradenitis, Hurley Stage 3. Most bothered by posterior scrotum/perineum.  HIV, well controlled  History of DVT, on Xarelto  Plan: Excise posterior scrotum, perineum, possible unroofing of hidradenitis tracts  Will need clearance to come off Xarelto prior to scheduling - message sent via MyChart        Interval history:  ***  Lost to fu  Derm 03/03/23. Discussed considering deroofing procedure for groin and procedure for scrotal area with Reconstructive Urology -referral to Dr. Guy Sandifer today. cOntinue Humira, metronidazole, triamcinolone.     {histories:21024926}    ROS:   {ROS - COMPLETE:2563963839}  The patient was asked to review all abnormal responses not pertinent to today's visit with their primary care physician.    ***  There were no vitals taken for this visit.    Physical Exam:    General: well developed, well nourished, no acute distress  HEENT: PERLA, EOM intact, normocephalic, atraumatic  Neck: supple and no masses  Chest: symmetrical  Lungs: non-labored breathing  Heart: normal rhythm, no JVD  Abdomen: no tenderness, no masses or hernias, no palpable organomegaly  GU:  Extremities: no deformities, no edema, no cyanosis  The patient was offered a medical chaperone during the sensitive examination during this encounter.  {bdfchaperone:104994}

## 2023-03-07 DIAGNOSIS — L732 Hidradenitis suppurativa: Principal | ICD-10-CM

## 2023-03-07 MED ORDER — METRONIDAZOLE 250 MG TABLET
ORAL_TABLET | Freq: Three times a day (TID) | ORAL | 3 refills | 30 days
Start: 2023-03-07 — End: ?

## 2023-03-07 NOTE — Unmapped (Signed)
Returned call to patient.  Per patient, needs metronidazole Rx to go to [facility].  Informed patient this pharmacy is listed on his chart.  Down to last 5 pills.    Pended Refill for new pharmacy.

## 2023-03-09 MED ORDER — METRONIDAZOLE 250 MG TABLET
ORAL_TABLET | Freq: Three times a day (TID) | ORAL | 3 refills | 30 days | Status: CP
Start: 2023-03-09 — End: ?

## 2023-03-10 ENCOUNTER — Ambulatory Visit: Admit: 2023-03-10 | Discharge: 2023-03-11 | Payer: BLUE CROSS/BLUE SHIELD | Attending: Urology | Primary: Urology

## 2023-03-10 DIAGNOSIS — L732 Hidradenitis suppurativa: Secondary | ICD-10-CM | POA: Diagnosis not present

## 2023-03-10 MED ORDER — TRIAMCINOLONE ACETONIDE 0.1 % TOPICAL OINTMENT
10 refills | 0.00 days
Start: 2023-03-10 — End: ?

## 2023-03-11 MED ORDER — TRIAMCINOLONE ACETONIDE 0.1 % TOPICAL OINTMENT
10 refills | 0.00 days | Status: CP
Start: 2023-03-11 — End: ?

## 2023-03-11 NOTE — Unmapped (Signed)
Hi,     Can you reach out to patient schedule an appointment with Dr. Janyth Contes?     Thanks,  Bear Stearns

## 2023-03-11 NOTE — Unmapped (Signed)
LOV: 03/03/23  FOV: none  Pended rx for your review  Sent call center a message to call patient and schedule follow up

## 2023-03-17 DIAGNOSIS — N183 Chronic kidney disease, stage 3 unspecified: Secondary | ICD-10-CM | POA: Diagnosis not present

## 2023-03-17 DIAGNOSIS — Z79899 Other long term (current) drug therapy: Secondary | ICD-10-CM | POA: Diagnosis not present

## 2023-03-19 NOTE — Unmapped (Signed)
[facility] Specialty and Home Delivery Pharmacy Refill Coordination Note    Specialty Medication(s) to be Shipped:   Inflammatory Disorders: Humira    Other medication(s) to be shipped: No additional medications requested for fill at this time     Dustin Prince, DOB: October 31, 1960  Phone: There are no phone numbers on file.      All above HIPAA information was verified with patient.     Was a Nurse, learning disability used for this call? No    Completed refill call assessment today to schedule patient's medication shipment from the [facility] and Home Delivery Pharmacy  [phone]).  All relevant notes have been reviewed.     Specialty medication(s) and dose(s) confirmed: Regimen is correct and unchanged.   Changes to medications: Dustin Prince reports no changes at this time.  Changes to insurance: No  New side effects reported not previously addressed with a pharmacist or physician: None reported  Questions for the pharmacist: No    Confirmed patient received a Conservation officer, historic buildings and a Surveyor, mining with first shipment. The patient will receive a drug information handout for each medication shipped and additional FDA Medication Guides as required.       DISEASE/MEDICATION-SPECIFIC INFORMATION        For patients on injectable medications: Patient currently has 0 doses left.  Next injection is scheduled for 03/20/2023.    SPECIALTY MEDICATION ADHERENCE     Medication Adherence    Patient reported X missed doses in the last month: 0  Specialty Medication: ADALIMUMAB PEN CITRATE FREE 40 MG/0.4 ML  Patient is on additional specialty medications: No  Support network for adherence: family member              Were doses missed due to medication being on hold? No    Humira 40/0.4 mg/ml: 0 doses of medicine on hand     REFERRAL TO PHARMACIST     Referral to the pharmacist: Not needed      [facility]     Shipping address confirmed in Epic.       Delivery Scheduled: Yes, Expected medication delivery date: 03/21/2023.     Medication will be delivered via UPS to the prescription address in Epic WAM.    Quintella Reichert   [facility] Specialty and Home Delivery Pharmacy  Specialty Technician

## 2023-03-20 LAB — QUANTIFERON TB GOLD PLUS
QUANTIFERON MITOGEN VALUE: >10 [IU]/mL
QUANTIFERON NIL VALUE: 0.06 [IU]/mL
QUANTIFERON TB GOLD: NEGATIVE
QUANTIFERON TB1 AG VALUE: 0.06 [IU]/mL
QUANTIFERON TB2 AG VALUE: 0.06 [IU]/mL

## 2023-03-20 MED FILL — HUMIRA PEN CITRATE FREE 40 MG/0.4 ML: SUBCUTANEOUS | 28 days supply | Qty: 4 | Fill #5

## 2023-04-03 DIAGNOSIS — R42 Dizziness and giddiness: Secondary | ICD-10-CM | POA: Diagnosis not present

## 2023-04-04 ENCOUNTER — Other Ambulatory Visit: Payer: Self-pay

## 2023-04-04 ENCOUNTER — Other Ambulatory Visit (HOSPITAL_COMMUNITY): Payer: Self-pay

## 2023-04-04 NOTE — Progress Notes (Signed)
 Specialty Pharmacy Refill Coordination Note  Allen Espinoza is a 63 y.o. male assessed today regarding refills of clinic administered specialty medication(s) Cabotegravir  & Rilpivirine  (CABENUVA )   Clinic requested Courier to Provider Office   Delivery date: 04/16/23   Verified address: [address] [place] [address]   Medication will be filled on 04/15/23.

## 2023-04-16 ENCOUNTER — Telehealth: Payer: Self-pay

## 2023-04-16 NOTE — Telephone Encounter (Signed)
 [facility] Patient Advocate Encounter  Patient's medications CABENUVA  have been couriered to [facility] from [facility] and will be administered at the patients appointment on 04/20/50.  Verline Glow, CPhT Specialty Pharmacy Patient [facility] for Infectious Disease Phone: [phone] Fax:  [phone]

## 2023-04-18 ENCOUNTER — Other Ambulatory Visit: Payer: Self-pay | Admitting: Family

## 2023-04-18 DIAGNOSIS — Z21 Asymptomatic human immunodeficiency virus [HIV] infection status: Secondary | ICD-10-CM

## 2023-04-18 DIAGNOSIS — Z9189 Other specified personal risk factors, not elsewhere classified: Secondary | ICD-10-CM

## 2023-04-19 NOTE — Unmapped (Signed)
[facility] Specialty and Home Delivery Pharmacy Refill Coordination Note    Specialty Medication(s) to be Shipped:   Inflammatory Disorders: Humira    Other medication(s) to be shipped: No additional medications requested for fill at this time     Dustin Prince, DOB: 1960-10-18  Phone: There are no phone numbers on file.      All above HIPAA information was verified with patient.     Was a Nurse, learning disability used for this call? No    Completed refill call assessment today to schedule patient's medication shipment from the [facility] and Home Delivery Pharmacy  [phone]).  All relevant notes have been reviewed.     Specialty medication(s) and dose(s) confirmed: Regimen is correct and unchanged.   Changes to medications: Dustin Prince reports no changes at this time.  Changes to insurance: No  New side effects reported not previously addressed with a pharmacist or physician: None reported  Questions for the pharmacist: No    Confirmed patient received a Conservation officer, historic buildings and a Surveyor, mining with first shipment. The patient will receive a drug information handout for each medication shipped and additional FDA Medication Guides as required.       DISEASE/MEDICATION-SPECIFIC INFORMATION        For patients on injectable medications: Patient currently has 0 doses left.  Next injection is scheduled for 1/23.    SPECIALTY MEDICATION ADHERENCE     Medication Adherence    Patient reported X missed doses in the last month: 0  Specialty Medication: Humira  Support network for adherence: family member              Were doses missed due to medication being on hold? No    Humira - 0 left    REFERRAL TO PHARMACIST     Referral to the pharmacist: Not needed      [facility]     Shipping address confirmed in Epic.       Delivery Scheduled: Yes, Expected medication delivery date: 1/22.     Medication will be delivered via UPS to the prescription address in Epic WAM.    Dustin Prince [facility]  Specialty Pharmacist

## 2023-04-21 ENCOUNTER — Encounter: Payer: Self-pay | Admitting: Family

## 2023-04-21 ENCOUNTER — Ambulatory Visit (INDEPENDENT_AMBULATORY_CARE_PROVIDER_SITE_OTHER): Payer: Medicaid Other | Admitting: Family

## 2023-04-21 ENCOUNTER — Other Ambulatory Visit: Payer: Self-pay

## 2023-04-21 VITALS — BP 143/76 | HR 55 | Temp 97.9°F | Ht 72.0 in | Wt 335.0 lb

## 2023-04-21 DIAGNOSIS — Z0289 Encounter for other administrative examinations: Secondary | ICD-10-CM

## 2023-04-21 DIAGNOSIS — Z21 Asymptomatic human immunodeficiency virus [HIV] infection status: Secondary | ICD-10-CM | POA: Diagnosis not present

## 2023-04-21 DIAGNOSIS — B20 Human immunodeficiency virus [HIV] disease: Secondary | ICD-10-CM | POA: Diagnosis not present

## 2023-04-21 DIAGNOSIS — N1831 Chronic kidney disease, stage 3a: Secondary | ICD-10-CM | POA: Diagnosis not present

## 2023-04-21 DIAGNOSIS — Z113 Encounter for screening for infections with a predominantly sexual mode of transmission: Secondary | ICD-10-CM | POA: Diagnosis not present

## 2023-04-21 DIAGNOSIS — Z0000 Encounter for general adult medical examination without abnormal findings: Secondary | ICD-10-CM

## 2023-04-21 MED ORDER — CABOTEGRAVIR & RILPIVIRINE ER 600 & 900 MG/3ML IM SUER
1.0000 | Freq: Once | INTRAMUSCULAR | Status: AC
  Administered 2023-04-21: 1 via INTRAMUSCULAR

## 2023-04-21 NOTE — Assessment & Plan Note (Signed)
Allen Espinoza has chronic kidney disease stage IIIa with estimated familiar filtration rate of 57.  Continue blood pressure control and monitor for any decrease in renal function.

## 2023-04-21 NOTE — Assessment & Plan Note (Signed)
Discussed importance of safe sexual practice and condom use. Condoms and site specific STD testing offered.  Vaccinations reviewed and up to date.  Due for routine dental care with [facility] clinic - contact info provided in AVS.  Colon cancer screening up to date.

## 2023-04-21 NOTE — Patient Instructions (Addendum)
Nice to see you.  We will check your lab work today.  Please call [facility]) to schedule/follow up on your dental care at [phone] x [phone]  Plan for follow up in 6 months or sooner if needed with lab work on the same day and pharmacy providers in between.   Have a great day and stay safe!

## 2023-04-21 NOTE — Progress Notes (Addendum)
Brief Narrative   Patient ID: Allen Espinoza, male    DOB: 07/04/1960, 63 y.o.   MRN: [id number]  Allen Espinoza is a 63 y/o male with HIV disease diagnosed in 2007 with risk factor of heterosexual contact. Initial viral load and CD4 count are unavailable.Genotype with no medication resistant mutations. No history of opportunistic infection. ART history with Genvoya and Prezista (DDI with Xarelto) and Biktarvy. Now on Cabenuva.   Subjective:    Chief Complaint  Patient presents with   Follow-up    B20    HPI:  Allen Espinoza is a 63 y.o. male with HIV disease last seen on 02/12/2023 by Margarite Gouge, PharmD, CPP with well-controlled virus and good adherence and tolerance to Cabenuva.  Previous lab work from 10/17/2022 with undetectable viral load and CD4 count 441.  Renal function was consistent with CKD stage IIIa with estimated GFR of 57.  Electrolytes and liver function testing within normal ranges.  Here today for routine follow-up.  Allen Espinoza has been doing well since his last office visit with no new concerns/adverse side effects from [place]. Does have occasionally non-painful knot which goes away. Requesting completion of [place] [employer] Forms to use [employer] as he has limited mobility being able to walk less than 1 block secondary to leg weakness and decreased endurance which prevents him from being able to access the fixed bus system and is a ongoing chronic problem.  Healthcare maintenance reviewed. Condoms and site specific STD testing offered. Due for routine dental care.   Denies fevers, chills, night sweats, headaches, changes in vision, neck pain/stiffness, nausea, diarrhea, vomiting, lesions or rashes.  Lab Results  Component Value Date   CD4TCELL 23 (L) 10/17/2022   CD4TABS 441 10/17/2022   Lab Results  Component Value Date   HIV1RNAQUANT Not Detected 10/17/2022     Allergies  Allergen Reactions   Sulfa Antibiotics Other (See Comments)    Low blood pressure    Vancomycin Itching    Give with benadryl      Outpatient Medications Prior to Visit  Medication Sig Dispense Refill   amLODipine (NORVASC) 10 MG tablet Take 1 tablet (10 mg total) by mouth daily. 180 tablet 3   cabotegravir & rilpivirine ER (CABENUVA) 600 & 900 MG/3ML injection Inject 1 kit into the muscle every 2 (two) months. 6 mL 5   HUMIRA PEN 80 MG/0.8ML PNKT Inject 80 mg into the skin once a week. Tuesday's     metroNIDAZOLE (FLAGYL) 250 MG tablet Take 250 mg by mouth 3 (three) times daily.     olmesartan (BENICAR) 20 MG tablet Take 1 tablet by mouth once daily 90 tablet 3   rosuvastatin (CRESTOR) 10 MG tablet Take 1 tablet (10 mg total) by mouth daily. 30 tablet 5   triamcinolone ointment (KENALOG) 0.1 % Apply 1 Application topically 2 (two) times daily.     XARELTO 20 MG TABS tablet Take 20 mg by mouth daily.     gabapentin (NEURONTIN) 300 MG capsule TAKE 1 CAPSULE BY MOUTH THREE TIMES DAILY (Patient not taking: Reported on 04/21/2023) 60 capsule 0   No facility-administered medications prior to visit.     Past Medical History:  Diagnosis Date   Abscess    Between legs   Arthritis    all over   Asthma    Avascular necrosis of femoral head, left (HCC)    Blood transfusion without reported diagnosis    Cholelithiasis    CKD (chronic kidney disease)  stage 3, GFR 30-59 ml/min (HCC)    sees kidney Dr.   [address] disorder [facility])    left DVT   Diverticulosis    DVT (deep venous thrombosis) (HCC)    legs   Dyspnea    when walking   Dysrhythmia    remembers mother taking about having an irregular rhythm years when he was a child    GERD (gastroesophageal reflux disease)    HIV (human immunodeficiency virus infection) (HCC)    Hypertension    Morbid obesity (HCC)      Past Surgical History:  Procedure Laterality Date   COLONOSCOPY  2019   DENTAL SURGERY     had teeth pulled   HYDRADENITIS EXCISION Left 10/14/2016   Procedure: WIDE EXCISION HIDRADENITIS LEFT AXILLA;   Surgeon: Abigail Miyamoto, MD;  Location: [facility] OR;  Service: General;  Laterality: Left;   JOINT REPLACEMENT     Left hip Dr. Magnus Ivan 11/29/16   TOTAL HIP ARTHROPLASTY Left 11/29/2016   Procedure: LEFT TOTAL HIP ARTHROPLASTY ANTERIOR APPROACH;  Surgeon: Kathryne Hitch, MD;  Location: [facility] ORS;  Service: Orthopedics;  Laterality: Left;      Review of Systems  Constitutional:  Negative for appetite change, chills, fatigue, fever and unexpected weight change.  Eyes:  Negative for visual disturbance.  Respiratory:  Negative for cough, chest tightness, shortness of breath and wheezing.   Cardiovascular:  Negative for chest pain and leg swelling.  Gastrointestinal:  Negative for abdominal pain, constipation, diarrhea, nausea and vomiting.  Genitourinary:  Negative for dysuria, flank pain, frequency, genital sores, hematuria and urgency.  Skin:  Negative for rash.  Allergic/Immunologic: Negative for immunocompromised state.  Neurological:  Negative for dizziness and headaches.      Objective:    BP (!) 143/76   Pulse (!) 55   Temp 97.9 F (36.6 C) (Temporal)   Ht 6' (1.829 m)   Wt (!) 335 lb (152 kg)   SpO2 99%   BMI 45.43 kg/m  Nursing note and vital signs reviewed.  Physical Exam Constitutional:      General: He is not in acute distress.    Appearance: He is well-developed.  Eyes:     Conjunctiva/sclera: Conjunctivae normal.  Cardiovascular:     Rate and Rhythm: Normal rate and regular rhythm.     Heart sounds: Normal heart sounds. No murmur heard.    No friction rub. No gallop.  Pulmonary:     Effort: Pulmonary effort is normal. No respiratory distress.     Breath sounds: Normal breath sounds. No wheezing or rales.  Chest:     Chest wall: No tenderness.  Abdominal:     General: Bowel sounds are normal.     Palpations: Abdomen is soft.     Tenderness: There is no abdominal tenderness.  Musculoskeletal:     Cervical back: Neck supple.  Lymphadenopathy:      Cervical: No cervical adenopathy.  Skin:    General: Skin is warm and dry.     Findings: No rash.  Neurological:     Mental Status: He is alert and oriented to person, place, and time.  Psychiatric:        Behavior: Behavior normal.        Thought Content: Thought content normal.        Judgment: Judgment normal.         04/21/2023    8:39 AM 10/17/2022    8:28 AM 04/25/2022    9:44 AM 01/10/2022  10:16 AM 12/12/2021    8:32 AM  Depression screen PHQ 2/9  Decreased Interest 0 0 0 0 0  Down, Depressed, Hopeless 0 0 0 0 0  PHQ - 2 Score 0 0 0 0 0       Assessment & Plan:    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) 08/19/2016   Morbid obesity (HCC) 08/19/2016   Solitary pulmonary nodule 05/06/2016   Hidradenitis suppurativa 04/27/2016   Chest pain 04/26/2016   Avascular necrosis of bone of hip, left s/p TRH (HCC) 03/19/2016   HIV (human immunodeficiency virus infection) (HCC) 03/18/2016   DVT (deep venous thrombosis) (HCC) 03/18/2016   Chronic kidney disease, stage 3a (HCC) 03/18/2016   Cholelithiasis 03/18/2016   Diverticulosis of colon without hemorrhage 03/18/2016     Problem List Items Addressed This Visit       Genitourinary   Chronic kidney disease, stage 3a [facility])   Allen Espinoza has chronic kidney disease stage IIIa with estimated familiar filtration rate of 57.  Continue blood pressure control and monitor for any decrease in renal function.        Other   HIV (human  immunodeficiency virus infection) (HCC) - Primary   Allen Espinoza continues to have well controlled virus with good adherence and tolerance to [place] .  Reviewed lab work and discussed plan of care, U equals U, and family planning. Check lab work. Continue current dose of Cabenuva . Plan for follow up in  6 months or sooner if needed with lab work on the same day. Follow up with Pharmacy provider in between q 2 months.        Relevant Orders   COMPLETE METABOLIC PANEL WITH GFR   HIV-1 RNA quant-no reflex-bld   T-helper cell (CD4)- ([facility] clinic only)   Healthcare maintenance   Discussed importance of safe sexual practice and condom use. Condoms and site specific STD testing offered.  Vaccinations reviewed and up to date.  Due for routine dental care with [facility] clinic - contact info provided in AVS.  Colon cancer screening up to date.       Other Visit Diagnoses       Screening for STDs (sexually transmitted diseases)       Relevant Orders   RPR     Encounter for completion of form with patient            I am having Eland D. Diona Browner maintain his Humira (2 Pen), olmesartan, Xarelto, gabapentin, metroNIDAZOLE, cabotegravir & rilpivirine ER, triamcinolone ointment, rosuvastatin, and amLODipine. We administered cabotegravir & rilpivirine ER.   Meds ordered this encounter  Medications   cabotegravir & rilpivirine ER (CABENUVA) 600 & 900 MG/3ML injection 1 kit     Follow-up: Return in about 6 months (around 10/19/2023), or if symptoms worsen or fail to improve. or sooner if needed.    Marcos Eke, MSN, FNP-C Nurse Practitioner [facility] for Infectious Disease [facility] Main number: [phone]

## 2023-04-21 NOTE — Assessment & Plan Note (Signed)
Mr. Okabe continues to have well controlled virus with good adherence and tolerance to [place] .  Reviewed lab work and discussed plan of care, U equals U, and family planning. Check lab work. Continue current dose of Cabenuva . Plan for follow up in  6 months or sooner if needed with lab work on the same day. Follow up with Pharmacy provider in between q 2 months.

## 2023-04-22 DIAGNOSIS — J209 Acute bronchitis, unspecified: Secondary | ICD-10-CM | POA: Diagnosis not present

## 2023-04-22 LAB — T-HELPER CELL (CD4) - (RCID CLINIC ONLY)
CD4 % Helper T Cell: 24 % — ABNORMAL LOW (ref 33–65)
CD4 T Cell Abs: 478 /uL (ref 400–1790)

## 2023-04-22 MED FILL — HUMIRA PEN CITRATE FREE 40 MG/0.4 ML: SUBCUTANEOUS | 28 days supply | Qty: 4 | Fill #6

## 2023-04-23 LAB — COMPLETE METABOLIC PANEL WITH GFR
AG Ratio: 0.9 (calc) — ABNORMAL LOW (ref 1.0–2.5)
ALT: 8 U/L — ABNORMAL LOW (ref 9–46)
AST: 12 U/L (ref 10–35)
Albumin: 3.6 g/dL (ref 3.6–5.1)
Alkaline phosphatase (APISO): 66 U/L (ref 35–144)
BUN: 22 mg/dL (ref 7–25)
CO2: 26 mmol/L (ref 20–32)
Calcium: 9.3 mg/dL (ref 8.6–10.3)
Chloride: 109 mmol/L (ref 98–110)
Creat: 1.35 mg/dL (ref 0.70–1.35)
Globulin: 4 g/dL — ABNORMAL HIGH (ref 1.9–3.7)
Glucose, Bld: 104 mg/dL — ABNORMAL HIGH (ref 65–99)
Potassium: 4.3 mmol/L (ref 3.5–5.3)
Sodium: 142 mmol/L (ref 135–146)
Total Bilirubin: 0.5 mg/dL (ref 0.2–1.2)
Total Protein: 7.6 g/dL (ref 6.1–8.1)
eGFR: 59 mL/min/{1.73_m2} — ABNORMAL LOW (ref >=60)

## 2023-04-23 LAB — HIV-1 RNA QUANT-NO REFLEX-BLD
HIV 1 RNA Quant: <20 {copies}/mL — ABNORMAL HIGH
HIV-1 RNA Quant, Log: <1.3 {Log} — ABNORMAL HIGH

## 2023-04-23 LAB — RPR: RPR Ser Ql: NONREACTIVE

## 2023-05-07 NOTE — Unmapped (Signed)
[facility] Specialty and Home Delivery Pharmacy Refill Coordination Note    Specialty Medication(s) to be Shipped:   Inflammatory Disorders: Humira    Other medication(s) to be shipped: No additional medications requested for fill at this time     Dustin Prince, DOB: December 29, 1960  Phone: There are no phone numbers on file.      All above HIPAA information was verified with patient.     Was a Nurse, learning disability used for this call? No    Completed refill call assessment today to schedule patient's medication shipment from the [facility] and Home Delivery Pharmacy  [phone]).  All relevant notes have been reviewed.     Specialty medication(s) and dose(s) confirmed: Regimen is correct and unchanged.   Changes to medications: Dustin Prince reports no changes at this time.  Changes to insurance: No  New side effects reported not previously addressed with a pharmacist or physician: None reported  Questions for the pharmacist: No    Confirmed patient received a Conservation officer, historic buildings and a Surveyor, mining with first shipment. The patient will receive a drug information handout for each medication shipped and additional FDA Medication Guides as required.       DISEASE/MEDICATION-SPECIFIC INFORMATION        For patients on injectable medications: Patient currently has 2 doses left.  Next injection is scheduled for 2/6.    SPECIALTY MEDICATION ADHERENCE     Medication Adherence    Patient reported X missed doses in the last month: 0  Specialty Medication: ADALIMUMAB PEN CITRATE FREE 40 MG/0.4 ML  Patient is on additional specialty medications: No  Informant: patient  Support network for adherence: family member              Were doses missed due to medication being on hold? No    Humira 40/0.4 mg/ml: 2 Doses of medicine on hand     REFERRAL TO PHARMACIST     Referral to the pharmacist: Not needed      SHIPPING     Shipping address confirmed in Epic.       Delivery Scheduled: Yes, Expected medication delivery date: 2/13.     Medication will be delivered via UPS to the prescription address in Epic WAM.    [facility] and [facility]

## 2023-05-08 DIAGNOSIS — L732 Hidradenitis suppurativa: Principal | ICD-10-CM

## 2023-05-08 MED ORDER — METRONIDAZOLE 250 MG TABLET
ORAL_TABLET | Freq: Three times a day (TID) | ORAL | 10 refills | 0.00 days
Start: 2023-05-08 — End: ?

## 2023-05-09 MED ORDER — METRONIDAZOLE 250 MG TABLET
ORAL_TABLET | Freq: Three times a day (TID) | ORAL | 10 refills | 30.00 days | Status: CP
Start: 2023-05-09 — End: ?

## 2023-05-11 ENCOUNTER — Other Ambulatory Visit: Payer: Self-pay | Admitting: Family

## 2023-05-11 DIAGNOSIS — Z21 Asymptomatic human immunodeficiency virus [HIV] infection status: Secondary | ICD-10-CM

## 2023-05-11 DIAGNOSIS — Z9189 Other specified personal risk factors, not elsewhere classified: Secondary | ICD-10-CM

## 2023-05-12 DIAGNOSIS — D631 Anemia in chronic kidney disease: Secondary | ICD-10-CM | POA: Diagnosis not present

## 2023-05-12 DIAGNOSIS — I129 Hypertensive chronic kidney disease with stage 1 through stage 4 chronic kidney disease, or unspecified chronic kidney disease: Secondary | ICD-10-CM | POA: Diagnosis not present

## 2023-05-12 DIAGNOSIS — I5189 Other ill-defined heart diseases: Secondary | ICD-10-CM | POA: Diagnosis not present

## 2023-05-12 DIAGNOSIS — B20 Human immunodeficiency virus [HIV] disease: Secondary | ICD-10-CM | POA: Diagnosis not present

## 2023-05-12 DIAGNOSIS — N183 Chronic kidney disease, stage 3 unspecified: Secondary | ICD-10-CM | POA: Diagnosis not present

## 2023-05-12 DIAGNOSIS — N2581 Secondary hyperparathyroidism of renal origin: Secondary | ICD-10-CM | POA: Diagnosis not present

## 2023-05-14 DIAGNOSIS — M810 Age-related osteoporosis without current pathological fracture: Secondary | ICD-10-CM | POA: Diagnosis not present

## 2023-05-14 DIAGNOSIS — Z79899 Other long term (current) drug therapy: Secondary | ICD-10-CM | POA: Diagnosis not present

## 2023-05-14 DIAGNOSIS — E7800 Pure hypercholesterolemia, unspecified: Secondary | ICD-10-CM | POA: Diagnosis not present

## 2023-05-14 DIAGNOSIS — Z7289 Other problems related to lifestyle: Secondary | ICD-10-CM | POA: Diagnosis not present

## 2023-05-14 DIAGNOSIS — Z131 Encounter for screening for diabetes mellitus: Secondary | ICD-10-CM | POA: Diagnosis not present

## 2023-05-14 DIAGNOSIS — E039 Hypothyroidism, unspecified: Secondary | ICD-10-CM | POA: Diagnosis not present

## 2023-05-14 DIAGNOSIS — R42 Dizziness and giddiness: Secondary | ICD-10-CM | POA: Diagnosis not present

## 2023-05-14 DIAGNOSIS — R5383 Other fatigue: Secondary | ICD-10-CM | POA: Diagnosis not present

## 2023-05-14 DIAGNOSIS — Z125 Encounter for screening for malignant neoplasm of prostate: Secondary | ICD-10-CM | POA: Diagnosis not present

## 2023-05-14 MED FILL — HUMIRA PEN CITRATE FREE 40 MG/0.4 ML: SUBCUTANEOUS | 28 days supply | Qty: 4 | Fill #7

## 2023-05-15 DIAGNOSIS — R42 Dizziness and giddiness: Secondary | ICD-10-CM | POA: Diagnosis not present

## 2023-05-19 DIAGNOSIS — I10 Essential (primary) hypertension: Secondary | ICD-10-CM | POA: Diagnosis not present

## 2023-05-20 DIAGNOSIS — R42 Dizziness and giddiness: Secondary | ICD-10-CM | POA: Diagnosis not present

## 2023-05-20 DIAGNOSIS — R1032 Left lower quadrant pain: Secondary | ICD-10-CM | POA: Diagnosis not present

## 2023-05-20 DIAGNOSIS — Z0000 Encounter for general adult medical examination without abnormal findings: Secondary | ICD-10-CM | POA: Diagnosis not present

## 2023-05-20 DIAGNOSIS — R002 Palpitations: Secondary | ICD-10-CM | POA: Diagnosis not present

## 2023-05-20 DIAGNOSIS — D485 Neoplasm of uncertain behavior of skin: Secondary | ICD-10-CM | POA: Diagnosis not present

## 2023-05-20 DIAGNOSIS — H9113 Presbycusis, bilateral: Secondary | ICD-10-CM | POA: Diagnosis not present

## 2023-05-20 DIAGNOSIS — I70219 Atherosclerosis of native arteries of extremities with intermittent claudication, unspecified extremity: Secondary | ICD-10-CM | POA: Diagnosis not present

## 2023-05-25 DIAGNOSIS — R42 Dizziness and giddiness: Secondary | ICD-10-CM | POA: Diagnosis not present

## 2023-05-29 ENCOUNTER — Other Ambulatory Visit: Payer: Self-pay | Admitting: Pharmacist

## 2023-05-29 ENCOUNTER — Other Ambulatory Visit (HOSPITAL_COMMUNITY): Payer: Self-pay

## 2023-05-29 ENCOUNTER — Other Ambulatory Visit: Payer: Self-pay

## 2023-05-29 DIAGNOSIS — B20 Human immunodeficiency virus [HIV] disease: Secondary | ICD-10-CM

## 2023-05-29 MED ORDER — CABOTEGRAVIR & RILPIVIRINE ER 600 & 900 MG/3ML IM SUER
1.0000 | INTRAMUSCULAR | 5 refills | Status: AC
  Filled 2023-05-29: qty 6, 34d supply, fill #0
  Filled 2023-07-28: qty 6, 34d supply, fill #1
  Filled 2023-10-02: qty 6, 34d supply, fill #2
  Filled 2023-12-02: qty 6, 34d supply, fill #3
  Filled 2024-01-21: qty 6, 34d supply, fill #4
  Filled 2024-04-12: qty 6, 34d supply, fill #5

## 2023-05-29 NOTE — Progress Notes (Signed)
 Specialty Pharmacy Refill Coordination Note  Allen Espinoza is a 63 y.o. male assessed today regarding refills of clinic administered specialty medication(s) Cabotegravir & Rilpivirine [facility])   Clinic requested Courier to Provider Office   Delivery date: 06/05/23   Verified address: [address] [place] [address]   Medication will be filled on 06/04/23.

## 2023-05-30 ENCOUNTER — Other Ambulatory Visit: Payer: Self-pay

## 2023-05-31 DIAGNOSIS — R1032 Left lower quadrant pain: Secondary | ICD-10-CM | POA: Diagnosis not present

## 2023-06-03 DIAGNOSIS — I10 Essential (primary) hypertension: Secondary | ICD-10-CM | POA: Diagnosis not present

## 2023-06-04 ENCOUNTER — Other Ambulatory Visit: Payer: Self-pay

## 2023-06-05 ENCOUNTER — Telehealth: Payer: Self-pay

## 2023-06-05 NOTE — Telephone Encounter (Signed)
 [facility] Patient Advocate Encounter  Patient's medications Allen Espinoza have been couriered to [facility] from [facility] pharmacy and will be administered at the patients appointment on 06/12/23.  Clearance Coots, CPhT Specialty Pharmacy Patient [facility] for Infectious Disease Phone: [phone] Fax:  [phone]

## 2023-06-06 NOTE — Progress Notes (Signed)
 HPI: Allen RADELL is a 63 y.o. male who presents to the [facility] pharmacy clinic for [place] administration.  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) 08/19/2016   Morbid obesity (HCC) 08/19/2016   Solitary pulmonary nodule 05/06/2016   Hidradenitis suppurativa 04/27/2016   Chest pain 04/26/2016   Avascular necrosis of bone of hip, left s/p TRH (HCC) 03/19/2016   HIV (human immunodeficiency virus infection) (HCC) 03/18/2016   DVT (deep venous thrombosis) (HCC) 03/18/2016   Chronic kidney disease, stage 3a (HCC) 03/18/2016   Cholelithiasis 03/18/2016   Diverticulosis of colon without hemorrhage 03/18/2016    Patient's Medications  New Prescriptions   No medications on file  Previous Medications   AMLODIPINE (NORVASC) 10 MG TABLET    Take 1 tablet (10 mg total) by mouth daily.   CABOTEGRAVIR & RILPIVIRINE ER (CABENUVA) 600 & 900 MG/3ML INJECTION    Inject 1 kit into the muscle every 2 (two) months.   GABAPENTIN (NEURONTIN) 300 MG CAPSULE    TAKE 1 CAPSULE BY MOUTH THREE TIMES DAILY   HUMIRA PEN 80 MG/0.8ML PNKT    Inject 80 mg into the skin once a week. Tuesday's   METRONIDAZOLE (FLAGYL) 250 MG TABLET    Take 250 mg by mouth 3 (three) times daily.   OLMESARTAN (BENICAR) 20 MG TABLET    Take 1 tablet by mouth once daily   ROSUVASTATIN (CRESTOR) 10 MG TABLET    TAKE 1 TABLET BY MOUTH ONCE DAILY   TRIAMCINOLONE OINTMENT (KENALOG) 0.1 %    Apply 1 Application topically 2 (two) times daily.   XARELTO  20 MG TABS TABLET    Take 20 mg by mouth daily.  Modified Medications   No medications on file  Discontinued Medications   No medications on file    Allergies: Allergies  Allergen Reactions   Sulfa Antibiotics Other (See Comments)    Low blood pressure   Vancomycin Itching    Give with benadryl    Past Medical History: Past Medical History:  Diagnosis Date   Abscess    Between legs   Arthritis    all over   Asthma    Avascular necrosis of femoral head, left (HCC)    Blood transfusion without reported diagnosis    Cholelithiasis    CKD (chronic kidney disease) stage 3, GFR 30-59 ml/min (HCC)    sees kidney Dr.   [place] [address] disorder (HCC)    left DVT   Diverticulosis    DVT (deep venous thrombosis) (HCC)    legs   Dyspnea    when walking   Dysrhythmia    remembers mother taking about having an irregular rhythm years when he was a child    GERD (gastroesophageal reflux disease)    HIV (human immunodeficiency virus infection) (HCC)    Hypertension    Morbid obesity (HCC)     Social History: Social History   Socioeconomic History   Marital status: Single    Spouse name: Not on file   Number  of children: 2   Years of education: Not on file   Highest education level: Not on file  Occupational History   Occupation: Disability  Tobacco Use   Smoking status: Never   Smokeless tobacco: Never  Vaping Use   Vaping status: Never Used  Substance and Sexual Activity   Alcohol use: No    Comment: prior   Drug use: No    Comment: prior cocaine use, last 2005   Sexual activity: Not Currently    Partners: Female    Birth control/protection: Condom  Other Topics Concern   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, mailroom, warehouse work   Lives w/ fiancee   No EtOH, tobacco, drugs   Social Drivers of Corporate investment banker Strain: Medium Risk (02/18/2022)   Overall Financial Resource Strain (CARDIA)    Difficulty of Paying  Living Expenses: Somewhat hard  Food Insecurity: No Food Insecurity (04/22/2022)   Hunger Vital Sign    Worried About Running Out of Food in the Last Year: Never true    Ran Out of Food in the Last Year: Never true  Transportation Needs: No Transportation Needs (08/29/2022)   PRAPARE - Administrator, Civil Service (Medical): No    Lack of Transportation (Non-Medical): No  Physical Activity: Insufficiently Active (12/10/2017)   Exercise Vital Sign    Days of Exercise per Week: 2 days    Minutes of Exercise per Session: 10 min  Stress: No Stress Concern Present (12/10/2017)   [employer] of Occupational Health - Occupational Stress Questionnaire    Feeling of Stress : Only a little  Social Connections: Somewhat Isolated (12/10/2017)   Social Connection and Isolation Panel [NHANES]    Frequency of Communication with Friends and Family: Three times a week    Frequency of Social Gatherings with Friends and Family: More than three times a week    Attends Religious Services: Never    Database administrator or Organizations: No    Attends Banker Meetings: Never    Marital Status: Living with partner    Labs: Lab Results  Component Value Date   HIV1RNAQUANT <20 (H) 04/21/2023   HIV1RNAQUANT Not Detected 10/17/2022   HIV1RNAQUANT Not Detected 08/15/2022   HIV1RNAVL 47,300 03/18/2016   CD4TABS 478 04/21/2023   CD4TABS 441 10/17/2022   CD4TABS 434 04/25/2022    RPR and STI Lab Results  Component Value Date   LABRPR NON-REACTIVE 04/21/2023   LABRPR NON-REACTIVE 04/25/2022   LABRPR NON-REACTIVE 06/21/2021   LABRPR NON-REACTIVE 02/01/2020   LABRPR NON-REACTIVE 08/18/2018    STI Results GC CT  02/13/2021  9:52 AM Negative  Negative   02/01/2020 11:01 AM Negative  Negative   03/03/2019 12:00 AM Negative  C Negative  C  05/01/2017 12:00 AM Negative  Negative   08/07/2016 12:00 AM Negative  Negative   04/26/2016 12:00 AM Negative  Negative     C  Corrected result    Hepatitis B Lab Results  Component Value Date   HEPBSAB REACTIVE (A) 11/19/2016   HEPBSAG NON-REACTIVE 11/19/2016   Hepatitis C No results found for: "HEPCAB", "HCVRNAPCRQN" Hepatitis A Lab Results  Component Value Date   HAV REACTIVE (A) 08/15/2022   Lipids: Lab Results  Component Value Date   CHOL 161 04/25/2022   TRIG 65 04/25/2022   HDL 67 04/25/2022   CHOLHDL 2.4 04/25/2022   VLDL 16 08/07/2016   LDLCALC 80  04/25/2022    TARGET DATE:  The 20th of the month  Assessment: Kalum presents today for their maintenance Cabenuva injections. Initial/past injections were tolerated well without issues. No problems with systemic effects of injections.   Administered cabotegravir 600mg /64mL in left upper outer quadrant of the gluteal muscle. Administered rilpivirine 900 mg/53mL in the right upper outer quadrant of the gluteal muscle. Monitored patient for 10 minutes after injection. Injections were tolerated well without issue. Patient will follow up in 2 months for next injection. Will defer HIV RNA testing today as it was recently assessed in January. Due for lipid profile and CBC when labs are next assessed.  Up-to-date on vaccines and politely declines STI testing as he is not sexually active.  Plan: - Cabenuva injections administered - Next injections scheduled for 5/14 with me and 7/15 with Tammy Sours  - Call with any issues or questions  Margarite Gouge, PharmD, CPP, BCIDP, AAHIVP Clinical Pharmacist Practitioner Infectious Diseases Clinical Pharmacist [facility]

## 2023-06-09 ENCOUNTER — Other Ambulatory Visit: Payer: Self-pay

## 2023-06-09 DIAGNOSIS — Z9189 Other specified personal risk factors, not elsewhere classified: Secondary | ICD-10-CM

## 2023-06-09 DIAGNOSIS — Z21 Asymptomatic human immunodeficiency virus [HIV] infection status: Secondary | ICD-10-CM

## 2023-06-09 DIAGNOSIS — R1032 Left lower quadrant pain: Secondary | ICD-10-CM | POA: Diagnosis not present

## 2023-06-09 MED ORDER — ROSUVASTATIN CALCIUM 10 MG PO TABS: 10.0000 mg | ORAL_TABLET | Freq: Every day | ORAL | 0 refills | Status: DC

## 2023-06-12 ENCOUNTER — Ambulatory Visit: Payer: Medicaid Other | Admitting: Pharmacist

## 2023-06-12 ENCOUNTER — Other Ambulatory Visit: Payer: Self-pay

## 2023-06-12 DIAGNOSIS — Z21 Asymptomatic human immunodeficiency virus [HIV] infection status: Secondary | ICD-10-CM | POA: Diagnosis present

## 2023-06-12 MED ORDER — CABOTEGRAVIR & RILPIVIRINE ER 600 & 900 MG/3ML IM SUER
1.0000 | Freq: Once | INTRAMUSCULAR | Status: AC
  Administered 2023-06-12: 1 via INTRAMUSCULAR

## 2023-06-12 NOTE — Unmapped (Addendum)
 [name] reports his Humira continues to be helpful for his HS compared to baseline although he still flares occasionally.     I noticed a higher 80 mg dose in Man's med list - reaching out to provider for possible transfer/pa/appeal here. - Update - provider would like to stick with 40 mg/week for now. Other chart notes don't indicate a plan for increase so this may have been an error.     [facility] Clinical Assessment & Refill Coordination Note    Dustin Prince, Dustin Prince: 08/08/1960  Phone: There are no phone numbers on file.    All above HIPAA information was verified with patient.     Was a Nurse, learning disability used for this call? No    Specialty Medication(s):   Inflammatory Disorders: Humira     Current Outpatient Medications   Medication Sig Dispense Refill    adalimumab (HUMIRA,CF, PEN) 80 mg/0.8 mL PnKt Inject the contents of 1 pen (80 mg total) under the skin every seven (7) days. 12 each 3    ADALIMUMAB PEN CITRATE FREE 40 MG/0.4 ML Inject the contents of 1 pen (40 mg total) under the skin once a week. 4 each 11    bictegrav-emtricit-tenofov ala (BIKTARVY) 50-200-25 mg tablet Take 1 tablet by mouth daily.      cabotegravir-rilpivirine (CABENUVA) 600 mg-900 mg/3 mL extended-release injection Inject 1 kit into the muscle.      losartan (COZAAR) 50 MG tablet       methocarbamol (ROBAXIN) 750 MG tablet Take 1 tablet (750 mg total) by mouth.      metroNIDAZOLE (FLAGYL) 250 MG tablet TAKE 1 TABLET BY MOUTH 3 TIMES DAILY 90 tablet 10    rivaroxaban (XARELTO) 20 mg tablet Take 20 mg by mouth daily with evening meal.      triamcinolone (KENALOG) 0.1 % ointment APPLY TOPICALLY TWICE DAILY TO AFFECTED AREAS AS NEEDED FOR DERMATITIS IN THE GLUTEAL CLEFT 454 g 10     No current facility-administered medications for this visit.        Changes to medications: Rio reports no changes at this time.    Medication list has been reviewed and updated in Epic: Yes    Allergies   Allergen Reactions Sulfasalazine      Other reaction(s): Other (See Comments)  Low blood pressure    Vancomycin Itching     Give with benadryl       Changes to allergies: No    Allergies have been reviewed and updated in Epic: Yes    SPECIALTY MEDICATION ADHERENCE     Humira    Medication Adherence    Patient reported X missed doses in the last month: 0  Specialty Medication: Humira  Support network for adherence: family member          Specialty medication(s) dose(s) confirmed: Regimen is correct and unchanged.     Are there any concerns with adherence? No    Adherence counseling provided? Not needed    CLINICAL MANAGEMENT AND INTERVENTION      Clinical Benefit Assessment:    Do you feel the medicine is effective or helping your condition? Yes    Clinical Benefit counseling provided? Not needed    Adverse Effects Assessment:    Are you experiencing any side effects? No    Are you experiencing difficulty administering your medicine? No    Quality of Life Assessment:    Quality of Life    Rheumatology  Oncology  Dermatology  1.  What impact has your specialty medication had on the symptoms of your skin condition (i.e. itchiness, soreness, stinging)?: Some  2. What impact has your specialty medication had on your comfort level with your skin?: Some  Cystic Fibrosis          How many days over the past month did your HS  keep you from your normal activities? For example, brushing your teeth or getting up in the morning. Patient declined to answer    Have you discussed this with your provider? Not needed    Acute Infection Status:    Acute infections noted within Epic:  No active infections    Patient reported infection: None    Therapy Appropriateness:    Is therapy appropriate based on current medication list, adverse reactions, adherence, clinical benefit and progress toward achieving therapeutic goals? Yes, therapy is appropriate and should be continued     Clinical Intervention:    Was an intervention completed as part of this clinical assessment? Yes  [facility] - Clinical Pharmacist Intervention   Reason for Intervention Issue Observed Prescription clarification          Additional Comments Humira 80 mg order on profile sent to another pharmacy - patient reports he hasn't received.   Intervention Type of Intervention Provider contacted                  Recommendation provided, if applicable Should we transfer in order for PA/appeal work up or has this been attempted by clinic already?    Medication Medication(s) Involved Humira   Outcome Expected Result/Outcome of Intervention Improved therapy effectiveness    Additional Comments      Follow-up Needed?      Additional Follow-up Comments     Supporting Information Approximate Time Spent on Intervention 0-10 minutes    Clinical Evidence Used Chart review or clinical coordination        DISEASE/MEDICATION-SPECIFIC INFORMATION      For patients on injectable medications: Patient currently has 0 doses left.  Next injection is scheduled for 3/20.    Chronic Inflammatory Diseases: Have you experienced any flares in the last month? Yes, still improved from baseline, continues to have some flaring - has severe disease and this is expected despite treatment  Has this been reported to your provider? No    PATIENT SPECIFIC NEEDS     Does the patient have any physical, cognitive, or cultural barriers? No    Is the patient high risk? No    Does the patient require physician intervention or other additional services (i.e., nutrition, smoking cessation, social work)? No    Does the patient have an additional or emergency contact listed in their chart? Yes    SOCIAL DETERMINANTS OF HEALTH     At the [facility], we have learned that life circumstances - like trouble affording food, housing, utilities, or transportation can affect the health of many of our patients.   That is why we wanted to ask: are you currently experiencing any life circumstances that are negatively impacting your health and/or quality of life? Patient declined to answer    Social Drivers of Health     Food Insecurity: No Food Insecurity (04/22/2022)    Received from [facility], [facility]    Hunger Vital Sign     Worried About Running Out of Food in the Last Year: Never true     Ran Out of Food in the Last Year: Never  true   Tobacco Use: Low Risk  (03/10/2023)    Patient History     Smoking Tobacco Use: Never     Smokeless Tobacco Use: Never     Passive Exposure: Not on file   Transportation Needs: No Transportation Needs (08/29/2022)    Received from [facility] - Transportation     Lack of Transportation (Medical): No     Lack of Transportation (Non-Medical): No   Alcohol Use: Not on file   Housing: Not on file   Physical Activity: Insufficiently Active (12/10/2017)    Received from [facility], [facility]    Exercise Vital Sign     Days of Exercise per Week: 2 days     Minutes of Exercise per Session: 10 min   Utilities: Not At Risk (01/24/2022)    Received from [facility], [facility]    [facility] Utilities     Threatened with loss of utilities: No   Stress: No Stress Concern Present (12/10/2017)    Received from [facility], [facility]    [facility] of Occupational Health - Occupational Stress Questionnaire     Feeling of Stress : Only a little   Interpersonal Safety: Not on file   Substance Use: Not on file (02/04/2023)   Intimate Partner Violence: Not At Risk (12/10/2017)    Received from [facility], [facility]    Humiliation, Afraid, Rape, and Kick questionnaire     Fear of Current or Ex-Partner: No     Emotionally Abused: No     Physically Abused: No     Sexually Abused: No   Social Connections: Somewhat Isolated (12/10/2017)    Received from [facility], [facility]    Social Connection and Isolation Panel [NHANES]     Frequency of Communication with Friends and Family: Three times a week     Frequency of Social Gatherings with Friends and Family: More than three times a week     Attends Religious Services: Never     Database administrator or Organizations: No     Attends Engineer, structural: Never     Marital Status: Living with partner   Physicist, medical Strain: Medium Risk (02/18/2022)    Received from [facility], [facility]    Overall Financial Resource Strain (CARDIA)     Difficulty of Paying Living Expenses: Somewhat hard   Depression: Not on file   Internet Connectivity: Not on file   Health Literacy: Not on file       Would you be willing to receive help with any of the needs that you have identified today? Not applicable       SHIPPING     Specialty Medication(s) to be Shipped:   Inflammatory Disorders: Humira    Other medication(s) to be shipped: No additional medications requested for fill at this time     Changes to insurance: No    Cost and Payment: Patient has a copay of $4. They are aware and have authorized the pharmacy to charge the credit card on file.    Delivery Scheduled: Yes, Expected medication delivery date: 3/18.     Medication will be delivered via UPS to the confirmed prescription address in [facility].    The patient will receive a drug information handout for each medication shipped and additional FDA Medication Guides as required.  Verified that patient has previously received a Conservation officer, historic buildings and a Surveyor, mining.    The  patient or caregiver noted above participated in the development of this care plan and knows that they can request review of or adjustments to the care plan at any time.      All of the patient's questions and concerns have been addressed.    Marvette Schamp [facility] Specialty Pharmacist

## 2023-06-16 DIAGNOSIS — R8271 Bacteriuria: Secondary | ICD-10-CM | POA: Diagnosis not present

## 2023-06-16 DIAGNOSIS — R972 Elevated prostate specific antigen [PSA]: Secondary | ICD-10-CM | POA: Diagnosis not present

## 2023-06-16 DIAGNOSIS — N5201 Erectile dysfunction due to arterial insufficiency: Secondary | ICD-10-CM | POA: Diagnosis not present

## 2023-06-16 MED FILL — HUMIRA PEN CITRATE FREE 40 MG/0.4 ML: SUBCUTANEOUS | 84 days supply | Qty: 12 | Fill #8

## 2023-06-18 DIAGNOSIS — R1032 Left lower quadrant pain: Secondary | ICD-10-CM | POA: Diagnosis not present

## 2023-06-18 DIAGNOSIS — N183 Chronic kidney disease, stage 3 unspecified: Secondary | ICD-10-CM | POA: Diagnosis not present

## 2023-06-27 ENCOUNTER — Other Ambulatory Visit: Payer: Self-pay | Admitting: Urology

## 2023-06-27 DIAGNOSIS — R972 Elevated prostate specific antigen [PSA]: Secondary | ICD-10-CM

## 2023-06-28 ENCOUNTER — Emergency Department (HOSPITAL_COMMUNITY)

## 2023-06-28 ENCOUNTER — Other Ambulatory Visit: Payer: Self-pay

## 2023-06-28 ENCOUNTER — Emergency Department (HOSPITAL_COMMUNITY)
Admission: EM | Admit: 2023-06-28 | Discharge: 2023-06-28 | Disposition: A | Discharge status: Home / Self Care | Attending: Emergency Medicine | Admitting: Emergency Medicine

## 2023-06-28 DIAGNOSIS — I129 Hypertensive chronic kidney disease with stage 1 through stage 4 chronic kidney disease, or unspecified chronic kidney disease: Secondary | ICD-10-CM | POA: Diagnosis not present

## 2023-06-28 DIAGNOSIS — Z79899 Other long term (current) drug therapy: Secondary | ICD-10-CM | POA: Insufficient documentation

## 2023-06-28 DIAGNOSIS — N189 Chronic kidney disease, unspecified: Secondary | ICD-10-CM | POA: Diagnosis not present

## 2023-06-28 DIAGNOSIS — R42 Dizziness and giddiness: Secondary | ICD-10-CM | POA: Diagnosis not present

## 2023-06-28 DIAGNOSIS — Z7901 Long term (current) use of anticoagulants: Secondary | ICD-10-CM | POA: Diagnosis not present

## 2023-06-28 DIAGNOSIS — I6782 Cerebral ischemia: Secondary | ICD-10-CM | POA: Diagnosis not present

## 2023-06-28 DIAGNOSIS — I499 Cardiac arrhythmia, unspecified: Secondary | ICD-10-CM | POA: Diagnosis not present

## 2023-06-28 DIAGNOSIS — I951 Orthostatic hypotension: Secondary | ICD-10-CM | POA: Diagnosis not present

## 2023-06-28 DIAGNOSIS — W19XXXA Unspecified fall, initial encounter: Secondary | ICD-10-CM | POA: Diagnosis not present

## 2023-06-28 DIAGNOSIS — Z21 Asymptomatic human immunodeficiency virus [HIV] infection status: Secondary | ICD-10-CM | POA: Insufficient documentation

## 2023-06-28 DIAGNOSIS — I959 Hypotension, unspecified: Secondary | ICD-10-CM | POA: Diagnosis not present

## 2023-06-28 DIAGNOSIS — R0602 Shortness of breath: Secondary | ICD-10-CM | POA: Diagnosis not present

## 2023-06-28 DIAGNOSIS — R55 Syncope and collapse: Secondary | ICD-10-CM | POA: Diagnosis not present

## 2023-06-28 LAB — CBC WITH DIFFERENTIAL/PLATELET
Abs Immature Granulocytes: 0.06 10*3/uL (ref 0.00–0.07)
Basophils Absolute: 0 10*3/uL (ref 0.0–0.1)
Basophils Relative: 0 %
Eosinophils Absolute: 0.1 10*3/uL (ref 0.0–0.5)
Eosinophils Relative: 1 %
HCT: 42.2 % (ref 39.0–52.0)
Hemoglobin: 14 g/dL (ref 13.0–17.0)
Immature Granulocytes: 1 %
Lymphocytes Relative: 26 %
Lymphs Abs: 2.7 10*3/uL (ref 0.7–4.0)
MCH: 33.7 pg (ref 26.0–34.0)
MCHC: 33.2 g/dL (ref 30.0–36.0)
MCV: 101.4 fL — ABNORMAL HIGH (ref 80.0–100.0)
Monocytes Absolute: 1.3 10*3/uL — ABNORMAL HIGH (ref 0.1–1.0)
Monocytes Relative: 12 %
Neutro Abs: 6.4 10*3/uL (ref 1.7–7.7)
Neutrophils Relative %: 60 %
Platelets: 196 10*3/uL (ref 150–400)
RBC: 4.16 MIL/uL — ABNORMAL LOW (ref 4.22–5.81)
RDW: 12.5 % (ref 11.5–15.5)
WBC: 10.5 10*3/uL (ref 4.0–10.5)
nRBC: 0 % (ref 0.0–0.2)

## 2023-06-28 LAB — TROPONIN I (HIGH SENSITIVITY)
Troponin I (High Sensitivity): 7 ng/L (ref <18)
Troponin I (High Sensitivity): 5 ng/L (ref <18)

## 2023-06-28 LAB — TSH: TSH: 1.11 u[IU]/mL (ref 0.350–4.500)

## 2023-06-28 MED ORDER — LACTATED RINGERS IV BOLUS
1000.0000 mL | Freq: Once | INTRAVENOUS | Status: AC
  Administered 2023-06-28: 1000 mL via INTRAVENOUS

## 2023-06-28 NOTE — ED Triage Notes (Signed)
 EMS from home with c/o syncopal episode, +LOC, on thinners. Pt arrives A&0x4, GCS 15, states has been having "dizzy spells for a few weeks now, and just didn't make it to the chair."  Pt does take lasix but not sure why. EMS obtained PIV, bp of 90/51, EKG showed SR with PVC's Denies CP, HA, vision changes.  PMH HTN DVT's

## 2023-06-28 NOTE — Discharge Instructions (Addendum)
 Your dizzy spells and the episode of fainting today are likely due to orthostatic hypotension.  This is when the body is unable to maintain good blood flow to the brain when you change positions.  To avoid this, please change positions very slowly to allow your body time to adjust. You can also wear compression socks to help promote good blood flow.  Your blood pressures were on the lower side today.  Please keep well-hydrated at home with plenty of water.  Your blood counts were normal today.  Your heart enzyme (troponin) was normal today, this would be elevated if the heart is under any stress.  Your EKG showed some premature ventricular complexes (PVCs) which are extra beats.  Please make your PCP aware of this finding.  Please follow-up with your PCP within the next week for recheck of your symptoms and to discuss which medications you should be taking.  Return to the ER for any further episodes of fainting, chest pain, shortness of breath, any other new or concerning symptoms.

## 2023-06-28 NOTE — ED Provider Notes (Signed)
 [name] Provider Note   CSN: [id number] Arrival date & time: 06/28/23  1452     History  Chief Complaint  Patient presents with   Loss of Consciousness    Allen Espinoza is a 63 y.o. male with history of CKD, DVT on Xarelto, GERD, HIV, hypertension, who presents for a episode of syncope earlier today.  States he was standing in his kitchen earlier today when the room started appearing "light". He then passed out onto the floor.  He reports waking up and feeling a bump on his forehead. Currently denies any chest pain, dizziness, headaches, shortness of breath, or pain anywhere else in the body. He states he has been having these "dizzy episodes" over the past 2 weeks when he goes to stand up or exerts himself.  He reports some chronic shortness of breath, but no acute worsening.  Denies any chest pain.  States he has changed doctors and pharmacies recently, and his prescriptions have been changed.  He is unsure if he is taking the right ones or taking duplicates of some of them.  He states he is unsure what the medications he takes are, or what they are for.  HPI     Home Medications Prior to Admission medications   Medication Sig Start Date End Date Taking? Authorizing Provider  acetaminophen (TYLENOL) 500 MG tablet Take 1,000 mg by mouth daily as needed for moderate pain (pain score 4-6) or headache.   Yes [provider]  adalimumab (HUMIRA, 2 PEN,) 40 MG/0.4ML pen Inject 40 mg into the skin every Thursday.   Yes [provider]  amLODipine (NORVASC) 10 MG tablet Take 1 tablet (10 mg total) by mouth daily. 10/21/22 08/09/23 Yes BranchAlben Spittle, MD  cabotegravir & rilpivirine ER (CABENUVA) 600 & 900 MG/3ML injection Inject 1 kit into the muscle every 2 (two) months. 05/29/23 05/28/24 Yes Jennette Kettle, RPH-CPP  furosemide (LASIX) 40 MG tablet Take 40 mg by mouth daily.   Yes [provider]  metroNIDAZOLE (FLAGYL)  250 MG tablet Take 250 mg by mouth 3 (three) times daily.   Yes [provider]  olmesartan (BENICAR) 20 MG tablet Take 1 tablet by mouth once daily Patient taking differently: Take 20 mg by mouth at bedtime. 05/11/21  Yes Miguel Aschoff, MD  rosuvastatin (CRESTOR) 10 MG tablet Take 1 tablet (10 mg total) by mouth daily. 06/09/23  Yes Veryl Speak, FNP  triamcinolone ointment (KENALOG) 0.1 % Apply 1 Application topically 2 (two) times daily.   Yes [provider]  XARELTO 20 MG TABS tablet Take 20 mg by mouth daily. 11/30/21  Yes [provider]      Allergies    Sulfa antibiotics and Firvanq [vancomycin]    Review of Systems   Review of Systems  Cardiovascular:  Negative for chest pain.  Neurological:  Positive for dizziness and light-headedness. Negative for headaches.    Physical Exam Updated Vital Signs BP 114/61   Pulse (!) 54   Temp 98.4 F (36.9 C) (Oral)   Resp 15   SpO2 99%  Physical Exam Vitals and nursing note reviewed.  Constitutional:      Appearance: He is well-developed.     Comments: Very well appearing, no acute distress  HENT:     Head: Normocephalic and atraumatic. No raccoon eyes, Battle's sign, abrasion or laceration.     Comments: No tenderness to palpation of skull diffusely, no step-offs Eyes:  Extraocular Movements: Extraocular movements intact.     Conjunctiva/sclera: Conjunctivae normal.     Pupils: Pupils are equal, round, and reactive to light.  Cardiovascular:     Rate and Rhythm: Normal rate and regular rhythm.     Heart sounds: No murmur heard. Pulmonary:     Effort: Pulmonary effort is normal. No respiratory distress.     Breath sounds: Normal breath sounds.  Abdominal:     Palpations: Abdomen is soft.     Tenderness: There is no abdominal tenderness.  Musculoskeletal:        General: No swelling.     Cervical back: Neck supple.     Comments: General No obvious deformity. No erythema, edema,  contusions, open wounds   Palpation Non-tender to palpation of the clavicles,humerus, radius and ulna, carpal bones, 1st-5th metacarpals and phalanges bilaterally Non tender over the femur, patella, tibia or fibula bilaterally  Non-tender over the cervical, thoracic, or lumbar spinous processes. Non-tender to palpation of the paraspinal region of the back or chest wall diffusely  ROM Full ROM of shoulders bilaterally Full elbow, wrist, knee flexion and extension bilaterally Intact plantarflexion and dorsiflexion, hip flexion bilaterally  Sensation: Sensation intact throughout the bilateral upper and lower extremity  Strength: 5/5 strength with resisted elbow and wrist flexion and extension bilaterally 5/5 strength with resisted knee flexion and extension and ankle plantarflexion and dorsiflexion bilaterally    Skin:    General: Skin is warm and dry.     Capillary Refill: Capillary refill takes less than 2 seconds.  Neurological:     General: No focal deficit present.     Mental Status: He is alert.     Comments: Mental status: Alert and oriented to self, place, and month  Speech: Answers questions appropriately  Cranial Nerves: III, IV, VI: EOM intact, Pupils equal round and reactive, no gaze preference or deviation, no nystagmus. V: normal sensation in V1, V2, and V3 segments bilaterally VII: smiles, puffs cheeks, raises eyebrows, and closes eyes without asymmetry.  VIII: normal hearing to speech IX, X: normal palatal elevation, no uvular deviation XI: 5/5 head turn and 5/5 shoulder shrug bilaterally XII: midline tongue protrusion   Psychiatric:        Mood and Affect: Mood normal.     ED Results / Procedures / Treatments   Labs (all labs ordered are listed, but only abnormal results are displayed) Labs Reviewed  CBC WITH DIFFERENTIAL/PLATELET - Abnormal; Notable for the following components:      Result Value   RBC 4.16 (*)    MCV 101.4 (*)    Monocytes Absolute  1.3 (*)    All other components within normal limits  TSH  CBC WITH DIFFERENTIAL/PLATELET  TROPONIN I (HIGH SENSITIVITY)  TROPONIN I (HIGH SENSITIVITY)    EKG EKG Interpretation Date/Time:  Saturday June 28 2023 14:59:12 EDT Ventricular Rate:  60 PR Interval:  159 QRS Duration:  88 QT Interval:  409 QTC Calculation: 409 R Axis:   37  Text Interpretation: Sinus rhythm Ventricular trigeminy Confirmed by Alvino Blood ([id number]) on 06/28/2023 3:25:59 PM  Radiology DG Chest 2 View Result Date: 06/28/2023 CLINICAL DATA:  Shortness of breath and dizziness. EXAM: CHEST - 2 VIEW COMPARISON:  CT chest and chest x-ray dated October 24, 2021. FINDINGS: Stable cardiomediastinal silhouette with normal heart size. Normal pulmonary vascularity. No focal consolidation, pleural effusion, or pneumothorax. No acute osseous abnormality. Old left-sided rib fractures again noted IMPRESSION: No active cardiopulmonary disease. Electronically Signed  By: Obie Dredge M.D.   On: 06/28/2023 16:12   CT Head Wo Contrast Result Date: 06/28/2023 CLINICAL DATA:  Fall.  On blood thinners. EXAM: CT HEAD WITHOUT CONTRAST TECHNIQUE: Contiguous axial images were obtained from the base of the skull through the vertex without intravenous contrast. RADIATION DOSE REDUCTION: This exam was performed according to the departmental dose-optimization program which includes automated exposure control, adjustment of the mA and/or kV according to patient size and/or use of iterative reconstruction technique. COMPARISON:  CT head dated January 04, 2022. FINDINGS: Brain: No evidence of acute infarction, hemorrhage, hydrocephalus, extra-axial collection or mass lesion/mass effect. Similar mild chronic microvascular ischemic changes. Vascular: No hyperdense vessel or unexpected calcification. Skull: Normal. Negative for fracture or focal lesion. Sinuses/Orbits: No acute finding. Other: None. IMPRESSION: 1. No acute intracranial abnormality.  Electronically Signed   By: Obie Dredge M.D.   On: 06/28/2023 16:10    Procedures Procedures    Medications Ordered in ED Medications  lactated ringers bolus 1,000 mL (0 mLs Intravenous Stopped 06/28/23 1744)    ED Course/ Medical Decision Making/ A&P                                 Medical Decision Making Amount and/or Complexity of Data Reviewed Labs: ordered. Radiology: ordered.     Differential diagnosis includes but is not limited to ACS, arrhythmia, hypoglycemia, orthostatic hypotension, medication misuse, intracranial abnormality, fracture, dislocation  ED Course:  3:15 PM.  Upon initial evaluation, patient is very well-appearing.  Stable vital signs aside from a soft blood pressure 105/70.  Alert and oriented, no neurodeficits.  No abrasions or lacerations where the patient hit his head.  Nontender to palpation of the cervical, thoracic, and lumbar spine.  No tenderness to palpation of the upper and lower extremities bilaterally, and full range of motion in the upper and lower extremities bilaterally.  Low concern for any acute fracture or dislocation.  Will start with basic labs, troponin, EKG, CT head given fall on thinners, and chest x-ray given report of chronic shortness of breath.  Will obtain orthostatic vitals.  Patient started on 1 L LR bolus for his soft blood pressures. I Ordered, and personally interpreted labs.  The pertinent results include:   CBC without any leukocytosis.  Hemoglobin within normal limits Initial troponin of 7 and repeat of 5 TSH within normal limits Head CT and chest x-ray without any acute abnormality Orthostatic vitals were obtained.  Patient's blood pressure went from 106/66 to 93/54 when standing for 3 minutes.  Patient's heart rate went from 56 lying to 82 after standing for 3 minutes. Upon re-evaluation, patient well-appearing, stable vital signs.  Since his symptoms of pre-syncope occur when he goes to stand up, and he has  orthostatic vitals here, suspect his syncopal episode today and his "dizzy spells" are secondary to orthostatic hypotension.  Low concern for ACS at this time given troponin remains stable with initial troponin of 7 and repeat of 5, patient not having any chest pain or shortness of breath, and EKG with some PVCs but no ST changes. Low concern for PE at this time given no chest pain, shortness of breath, stable vitals, and he is on anticoagulation. CBC reassuring and patient denying any infectious symptoms, low concern for infectious etiology at this time.  I discussed this case with my attending Dr. Suezanne Jacquet who also personally evaluated the patient.  We feel patient is  appropriate for discharge home at this time.   Imaging Studies ordered: I ordered imaging studies including CT head, chest x-ray I independently visualized the imaging with scope of interpretation limited to determining acute life threatening conditions related to emergency care.  CT head without any acute abnormality Chest x-ray without any acute abnormality I agree with the radiologist interpretation   Cardiac Monitoring: / EKG: The patient was maintained on a cardiac monitor.  I personally viewed and interpreted the cardiac monitored which showed an underlying rhythm of: Normal sinus rhythm with PVC's not noted on previous EKGs   Impression: Orthostatic hypotension  Disposition:  The patient was discharged home with instructions to change positions slowly at home and wear compression socks to help with his orthostatic hypotension.  Keep well-hydrated.  Follow-up with PCP within the next week for recheck of symptoms and make PCP aware of the finding of PVC's on EKG today. Return precautions given.    This chart was dictated using voice recognition software, Dragon. Despite the best efforts of this provider to proofread and correct errors, errors may still occur which can change documentation meaning.            Final Clinical Impression(s) / ED Diagnoses Final diagnoses:  Orthostatic hypotension    Rx / DC Orders ED Discharge Orders     None         Arabella Merles, Cordelia Poche 06/28/23 1908    Lonell Grandchild, MD 06/29/23 1415

## 2023-06-30 ENCOUNTER — Other Ambulatory Visit (HOSPITAL_COMMUNITY): Payer: Self-pay | Admitting: Urology

## 2023-06-30 DIAGNOSIS — I10 Essential (primary) hypertension: Secondary | ICD-10-CM | POA: Diagnosis not present

## 2023-06-30 DIAGNOSIS — R972 Elevated prostate specific antigen [PSA]: Secondary | ICD-10-CM

## 2023-07-02 DIAGNOSIS — R42 Dizziness and giddiness: Secondary | ICD-10-CM | POA: Diagnosis not present

## 2023-07-02 DIAGNOSIS — I10 Essential (primary) hypertension: Secondary | ICD-10-CM | POA: Diagnosis not present

## 2023-07-07 ENCOUNTER — Ambulatory Visit (HOSPITAL_COMMUNITY)
Admission: RE | Admit: 2023-07-07 | Discharge: 2023-07-07 | Disposition: A | Discharge status: Home / Self Care | Source: Ambulatory Visit | Attending: Urology | Admitting: Urology

## 2023-07-07 DIAGNOSIS — R972 Elevated prostate specific antigen [PSA]: Secondary | ICD-10-CM | POA: Diagnosis not present

## 2023-07-07 DIAGNOSIS — N400 Enlarged prostate without lower urinary tract symptoms: Secondary | ICD-10-CM | POA: Diagnosis not present

## 2023-07-07 DIAGNOSIS — R590 Localized enlarged lymph nodes: Secondary | ICD-10-CM | POA: Diagnosis not present

## 2023-07-07 DIAGNOSIS — N4289 Other specified disorders of prostate: Secondary | ICD-10-CM | POA: Diagnosis not present

## 2023-07-07 MED ORDER — GADOBUTROL 1 MMOL/ML IV SOLN
10.0000 mL | Freq: Once | INTRAVENOUS | Status: AC | PRN
  Administered 2023-07-07: 10 mL via INTRAVENOUS

## 2023-07-23 NOTE — Unmapped (Signed)
 07/23/2023-Patient received a 84 day supply on 06/16/2023. Called to confirm Patient had dose for this month and left a voicemail. Reset next RC date to 6/2

## 2023-07-28 ENCOUNTER — Other Ambulatory Visit (HOSPITAL_COMMUNITY): Payer: Self-pay

## 2023-07-28 ENCOUNTER — Other Ambulatory Visit: Payer: Self-pay

## 2023-07-28 NOTE — Progress Notes (Signed)
 Specialty Pharmacy Refill Coordination Note  Allen Espinoza is a 63 y.o. male assessed today regarding refills of clinic administered specialty medication(s) Cabotegravir  & Rilpivirine  (CABENUVA )   Clinic requested Courier to Provider Office   Delivery date: 08/04/23   Verified address: [address] [place] [address]   Medication will be filled on 08/01/23.

## 2023-07-28 NOTE — Unmapped (Signed)
 error

## 2023-08-01 ENCOUNTER — Other Ambulatory Visit: Payer: Self-pay

## 2023-08-04 ENCOUNTER — Telehealth: Payer: Self-pay

## 2023-08-04 NOTE — Telephone Encounter (Signed)
 [facility] Patient Advocate Encounter  Patient's medications Cabenuva  have been couriered to [facility] from [facility] and will be administered at the patients appointment on 08/13/23.  Roylene Corn, CPhT Specialty Pharmacy Patient [facility] Phone: [phone] Fax:  [phone]

## 2023-08-06 DIAGNOSIS — R42 Dizziness and giddiness: Secondary | ICD-10-CM | POA: Diagnosis not present

## 2023-08-12 NOTE — Progress Notes (Unsigned)
 HPI: Allen Espinoza is a 63 y.o. male who presents to the [facility] pharmacy clinic for Cabenuva  administration.  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) 08/19/2016   Morbid obesity (HCC) 08/19/2016   Solitary pulmonary nodule 05/06/2016   Hidradenitis suppurativa 04/27/2016   Chest pain 04/26/2016   Avascular necrosis of bone of hip, left s/p TRH (HCC) 03/19/2016   HIV (human immunodeficiency virus infection) (HCC) 03/18/2016   DVT (deep venous thrombosis) (HCC) 03/18/2016   Chronic kidney disease, stage 3a (HCC) 03/18/2016   Cholelithiasis 03/18/2016   Diverticulosis of colon without hemorrhage 03/18/2016    Patient's Medications  New Prescriptions   No medications on file  Previous Medications   ACETAMINOPHEN  (TYLENOL ) 500 MG TABLET    Take 1,000 mg by mouth daily as needed for moderate pain (pain score 4-6) or headache.   ADALIMUMAB  (HUMIRA , 2 PEN,) 40 MG/0.4ML PEN    Inject 40 mg into the skin every Thursday.   AMLODIPINE  (NORVASC ) 10 MG TABLET    Take 1 tablet (10 mg total) by mouth daily.   CABOTEGRAVIR  & RILPIVIRINE  ER (CABENUVA ) 600 & 900 MG/3ML INJECTION    Inject 1 kit into the muscle every 2 (two) months.   FUROSEMIDE  (LASIX ) 40 MG TABLET    Take 40 mg by mouth daily.   METRONIDAZOLE  (FLAGYL ) 250 MG TABLET    Take 250 mg by mouth 3 (three) times daily.   OLMESARTAN  (BENICAR ) 20 MG TABLET    Take 1 tablet by mouth once daily   ROSUVASTATIN  (CRESTOR ) 10 MG TABLET    Take 1 tablet (10 mg  total) by mouth daily.   TRIAMCINOLONE  OINTMENT (KENALOG ) 0.1 %    Apply 1 Application topically 2 (two) times daily.   XARELTO  20 MG TABS TABLET    Take 20 mg by mouth daily.  Modified Medications   No medications on file  Discontinued Medications   No medications on file    Allergies: Allergies  Allergen Reactions   Sulfa Antibiotics Other (See Comments)    Hypotension    Firvanq  [Vancomycin ] Itching    OK to take with Benadryl     Labs: Lab Results  Component Value Date   HIV1RNAQUANT <20 (H) 04/21/2023   HIV1RNAQUANT Not Detected 10/17/2022   HIV1RNAQUANT Not Detected 08/15/2022   HIV1RNAVL 47,300 03/18/2016   CD4TABS 478 04/21/2023   CD4TABS 441 10/17/2022   CD4TABS 434 04/25/2022    RPR and STI Lab Results  Component Value Date   LABRPR NON-REACTIVE 04/21/2023   LABRPR NON-REACTIVE 04/25/2022   LABRPR NON-REACTIVE 06/21/2021   LABRPR NON-REACTIVE 02/01/2020   LABRPR NON-REACTIVE 08/18/2018    STI Results GC CT  02/13/2021  9:52 AM Negative  Negative   02/01/2020 11:01 AM Negative  Negative   03/03/2019 12:00 AM Negative  C Negative  C  05/01/2017 12:00 AM Negative  Negative   08/07/2016 12:00 AM Negative  Negative   04/26/2016 12:00 AM Negative  Negative     C Corrected result  Hepatitis B Lab Results  Component Value Date   HEPBSAB REACTIVE (A) 11/19/2016   HEPBSAG NON-REACTIVE 11/19/2016   Hepatitis C No results found for: "HEPCAB", "HCVRNAPCRQN" Hepatitis A Lab Results  Component Value Date   HAV REACTIVE (A) 08/15/2022   Lipids: Lab Results  Component Value Date   CHOL 161 04/25/2022   TRIG 65 04/25/2022   HDL 67 04/25/2022   CHOLHDL 2.4 04/25/2022   VLDL 16 08/07/2016   LDLCALC 80 04/25/2022    TARGET DATE: The 20th  Assessment: Allen Espinoza presents today for maintenance Cabenuva  injections. Past injections were tolerated well without issues. Last HIV RNA was undetectable in January. Doing well with no issues  today.  Administered cabotegravir  600mg /84mL in left upper outer quadrant of the gluteal muscle. Administered rilpivirine  900 mg/3mL in the right upper outer quadrant of the gluteal muscle. No issues with injections. He will follow up in 2 months for next set of injections.  Plan: - Cabenuva  injections administered - Next injections scheduled for 10/14/23 with Erla Haw and 12/16/23 with Mylinda Asa - Call with any issues or questions  Georga Killings, PharmD PGY-1 Pharmacy Resident

## 2023-08-13 ENCOUNTER — Other Ambulatory Visit: Payer: Self-pay

## 2023-08-13 ENCOUNTER — Ambulatory Visit: Admitting: Pharmacist

## 2023-08-13 DIAGNOSIS — Z21 Asymptomatic human immunodeficiency virus [HIV] infection status: Secondary | ICD-10-CM | POA: Diagnosis present

## 2023-08-13 MED ORDER — CABOTEGRAVIR & RILPIVIRINE ER 600 & 900 MG/3ML IM SUER
1.0000 | Freq: Once | INTRAMUSCULAR | Status: AC
  Administered 2023-08-13: 1 via INTRAMUSCULAR

## 2023-08-18 DIAGNOSIS — R972 Elevated prostate specific antigen [PSA]: Secondary | ICD-10-CM | POA: Diagnosis not present

## 2023-08-18 DIAGNOSIS — N411 Chronic prostatitis: Secondary | ICD-10-CM | POA: Diagnosis not present

## 2023-08-18 DIAGNOSIS — N410 Acute prostatitis: Secondary | ICD-10-CM | POA: Diagnosis not present

## 2023-08-19 DIAGNOSIS — R42 Dizziness and giddiness: Secondary | ICD-10-CM | POA: Diagnosis not present

## 2023-08-20 DIAGNOSIS — D485 Neoplasm of uncertain behavior of skin: Secondary | ICD-10-CM | POA: Diagnosis not present

## 2023-08-26 DIAGNOSIS — L732 Hidradenitis suppurativa: Principal | ICD-10-CM

## 2023-08-26 MED ORDER — HUMIRA PEN CITRATE FREE 40 MG/0.4 ML
SUBCUTANEOUS | 11 refills | 28.00000 days | Status: CP
Start: 2023-08-26 — End: ?
  Filled 2023-09-04: qty 4, 28d supply, fill #0

## 2023-08-26 NOTE — Unmapped (Signed)
 [facility] Specialty and Home Delivery Pharmacy Refill Coordination Note    Specialty Medication(s) to be Shipped:   Inflammatory Disorders: Humira     Other medication(s) to be shipped: No additional medications requested for fill at this time     Dustin Prince, DOB: [birth year]  Phone: There are no phone numbers on file.      All above HIPAA information was verified with patient.     Was a Nurse, learning disability used for this call? No    Completed refill call assessment today to schedule patient's medication shipment from the [facility]  [phone]).  All relevant notes have been reviewed.     Specialty medication(s) and dose(s) confirmed: Regimen is correct and unchanged.   Changes to medications: [name] reports no changes at this time.  Changes to insurance: No  New side effects reported not previously addressed with a pharmacist or physician: None reported  Questions for the pharmacist: No    Confirmed patient received a Conservation officer, historic buildings and a Surveyor, mining with first shipment. The patient will receive a drug information handout for each medication shipped and additional FDA Medication Guides as required.       DISEASE/MEDICATION-SPECIFIC INFORMATION        For patients on injectable medications: Patient currently has 2 doses left.  Next injection is scheduled for 08/27/2023.    SPECIALTY MEDICATION ADHERENCE     Medication Adherence    Patient reported X missed doses in the last month: 0  Specialty Medication: HUMIRA (CF) PEN 40 mg/0.4 mL injection (adalimumab )  Patient is on additional specialty medications: No  Support network for adherence: family member              Were doses missed due to medication being on hold? No    HUMIRA (CF) PEN 40 mg/0.4 mL injection (adalimumab ): 2 doses of medicine on hand       REFERRAL TO PHARMACIST     Referral to the pharmacist: Not needed      [facility]     Shipping address confirmed in Epic.     Cost and Payment: Patient has a copay of $4. They are aware and have authorized the pharmacy to charge the credit card on file.    Delivery Scheduled: Yes, Expected medication delivery date: 09/05/2023.  However, Rx request for refills was sent to the provider as there are none remaining.     Medication will be delivered via UPS to the prescription address in Epic WAM.    Dustin Prince   [facility] Specialty and Home Delivery Pharmacy  Specialty Technician

## 2023-08-29 DIAGNOSIS — R972 Elevated prostate specific antigen [PSA]: Secondary | ICD-10-CM | POA: Diagnosis not present

## 2023-08-29 DIAGNOSIS — N5201 Erectile dysfunction due to arterial insufficiency: Secondary | ICD-10-CM | POA: Diagnosis not present

## 2023-08-29 NOTE — Progress Notes (Signed)
 The 10-year ASCVD risk score (Arnett DK, et al., 2019) is: 9.7%   Values used to calculate the score:     Age: 63 years     Sex: Male     Is Non-Hispanic African American: Yes     Diabetic: No     Tobacco smoker: No     Systolic Blood Pressure: 107 mmHg     Is BP treated: Yes     HDL Cholesterol: 67 mg/dL     Total Cholesterol: 161 mg/dL  Currently prescribed rosuvastatin  10 mg.  Kesha Hurrell, BSN, RN

## 2023-09-01 ENCOUNTER — Other Ambulatory Visit: Payer: Self-pay | Admitting: Family

## 2023-09-01 DIAGNOSIS — Z9189 Other specified personal risk factors, not elsewhere classified: Secondary | ICD-10-CM

## 2023-09-01 DIAGNOSIS — Z21 Asymptomatic human immunodeficiency virus [HIV] infection status: Secondary | ICD-10-CM

## 2023-09-02 DIAGNOSIS — I10 Essential (primary) hypertension: Secondary | ICD-10-CM | POA: Diagnosis not present

## 2023-09-03 DIAGNOSIS — R42 Dizziness and giddiness: Secondary | ICD-10-CM | POA: Diagnosis not present

## 2023-09-03 DIAGNOSIS — L732 Hidradenitis suppurativa: Principal | ICD-10-CM

## 2023-09-04 ENCOUNTER — Ambulatory Visit (HOSPITAL_BASED_OUTPATIENT_CLINIC_OR_DEPARTMENT_OTHER): Admitting: Family Medicine

## 2023-09-12 ENCOUNTER — Observation Stay (HOSPITAL_COMMUNITY)
Admission: EM | Admit: 2023-09-12 | Discharge: 2023-09-13 | Disposition: A | Discharge status: Home / Self Care | Attending: Internal Medicine | Admitting: Internal Medicine

## 2023-09-12 ENCOUNTER — Other Ambulatory Visit: Payer: Self-pay

## 2023-09-12 ENCOUNTER — Emergency Department (HOSPITAL_COMMUNITY)

## 2023-09-12 ENCOUNTER — Observation Stay (HOSPITAL_BASED_OUTPATIENT_CLINIC_OR_DEPARTMENT_OTHER)

## 2023-09-12 ENCOUNTER — Other Ambulatory Visit: Payer: Self-pay | Admitting: Cardiology

## 2023-09-12 DIAGNOSIS — N1831 Chronic kidney disease, stage 3a: Secondary | ICD-10-CM | POA: Diagnosis not present

## 2023-09-12 DIAGNOSIS — R079 Chest pain, unspecified: Principal | ICD-10-CM

## 2023-09-12 DIAGNOSIS — R001 Bradycardia, unspecified: Principal | ICD-10-CM | POA: Diagnosis present

## 2023-09-12 DIAGNOSIS — R0600 Dyspnea, unspecified: Secondary | ICD-10-CM | POA: Diagnosis not present

## 2023-09-12 DIAGNOSIS — R42 Dizziness and giddiness: Secondary | ICD-10-CM | POA: Diagnosis not present

## 2023-09-12 DIAGNOSIS — Z79899 Other long term (current) drug therapy: Secondary | ICD-10-CM | POA: Insufficient documentation

## 2023-09-12 DIAGNOSIS — R072 Precordial pain: Secondary | ICD-10-CM

## 2023-09-12 DIAGNOSIS — R0609 Other forms of dyspnea: Secondary | ICD-10-CM | POA: Diagnosis not present

## 2023-09-12 DIAGNOSIS — R0789 Other chest pain: Secondary | ICD-10-CM | POA: Diagnosis not present

## 2023-09-12 DIAGNOSIS — I10 Essential (primary) hypertension: Secondary | ICD-10-CM | POA: Diagnosis not present

## 2023-09-12 DIAGNOSIS — Z21 Asymptomatic human immunodeficiency virus [HIV] infection status: Secondary | ICD-10-CM | POA: Diagnosis not present

## 2023-09-12 DIAGNOSIS — R9431 Abnormal electrocardiogram [ECG] [EKG]: Secondary | ICD-10-CM | POA: Diagnosis not present

## 2023-09-12 DIAGNOSIS — I129 Hypertensive chronic kidney disease with stage 1 through stage 4 chronic kidney disease, or unspecified chronic kidney disease: Secondary | ICD-10-CM | POA: Diagnosis not present

## 2023-09-12 DIAGNOSIS — J45909 Unspecified asthma, uncomplicated: Secondary | ICD-10-CM | POA: Diagnosis not present

## 2023-09-12 LAB — ECHOCARDIOGRAM COMPLETE
AR max vel: 2.81 cm2
AV Area VTI: 2.79 cm2
AV Area mean vel: 2.74 cm2
AV Mean grad: 4 mmHg
AV Peak grad: 7.1 mmHg
Ao pk vel: 1.33 m/s
Area-P 1/2: 4.78 cm2
Height: 72 in
S' Lateral: 3.8 cm
Weight: 5120 [oz_av]

## 2023-09-12 LAB — CBC
HCT: 41.5 % (ref 39.0–52.0)
Hemoglobin: 13.6 g/dL (ref 13.0–17.0)
MCH: 33.8 pg (ref 26.0–34.0)
MCHC: 32.8 g/dL (ref 30.0–36.0)
MCV: 103.2 fL — ABNORMAL HIGH (ref 80.0–100.0)
Platelets: 201 10*3/uL (ref 150–400)
RBC: 4.02 MIL/uL — ABNORMAL LOW (ref 4.22–5.81)
RDW: 12.3 % (ref 11.5–15.5)
WBC: 8.6 10*3/uL (ref 4.0–10.5)
nRBC: 0 % (ref 0.0–0.2)

## 2023-09-12 LAB — LIPID PANEL
Cholesterol: 109 mg/dL (ref 0–200)
HDL: 58 mg/dL (ref >40)
LDL Cholesterol: 39 mg/dL (ref 0–99)
Total CHOL/HDL Ratio: 1.9 ratio
Triglycerides: 58 mg/dL (ref <150)
VLDL: 12 mg/dL (ref 0–40)

## 2023-09-12 LAB — BASIC METABOLIC PANEL WITH GFR
Anion gap: 12 (ref 5–15)
BUN: 16 mg/dL (ref 8–23)
CO2: 23 mmol/L (ref 22–32)
Calcium: 9.3 mg/dL (ref 8.9–10.3)
Chloride: 105 mmol/L (ref 98–111)
Creatinine, Ser: 1.37 mg/dL — ABNORMAL HIGH (ref 0.61–1.24)
GFR, Estimated: 58 mL/min — ABNORMAL LOW (ref >60)
Glucose, Bld: 115 mg/dL — ABNORMAL HIGH (ref 70–99)
Potassium: 3.6 mmol/L (ref 3.5–5.1)
Sodium: 140 mmol/L (ref 135–145)

## 2023-09-12 LAB — BRAIN NATRIURETIC PEPTIDE: B Natriuretic Peptide: 53.5 pg/mL (ref 0.0–100.0)

## 2023-09-12 LAB — TROPONIN I (HIGH SENSITIVITY)
Troponin I (High Sensitivity): 5 ng/L (ref <18)
Troponin I (High Sensitivity): 6 ng/L (ref <18)

## 2023-09-12 LAB — HEMOGLOBIN A1C
Hgb A1c MFr Bld: 5.4 % (ref 4.8–5.6)
Mean Plasma Glucose: 108.28 mg/dL

## 2023-09-12 LAB — D-DIMER, QUANTITATIVE: D-Dimer, Quant: <0.27 ug{FEU}/mL (ref 0.00–0.50)

## 2023-09-12 MED ORDER — AMLODIPINE BESYLATE 10 MG PO TABS
10.0000 mg | ORAL_TABLET | Freq: Every day | ORAL | Status: DC
  Administered 2023-09-13: 10 mg via ORAL
  Filled 2023-09-12: qty 1

## 2023-09-12 MED ORDER — ACETAMINOPHEN 325 MG PO TABS
650.0000 mg | ORAL_TABLET | Freq: Four times a day (QID) | ORAL | Status: DC | PRN
  Administered 2023-09-12: 650 mg via ORAL
  Filled 2023-09-12: qty 2

## 2023-09-12 MED ORDER — RIVAROXABAN 20 MG PO TABS: 20.0000 mg | ORAL_TABLET | Freq: Every day | ORAL | Status: DC

## 2023-09-12 MED ORDER — SODIUM CHLORIDE 0.9% FLUSH
3.0000 mL | Freq: Two times a day (BID) | INTRAVENOUS | Status: DC
Start: 2023-09-12 — End: 2023-09-13
  Administered 2023-09-12 – 2023-09-13 (×3): 3 mL via INTRAVENOUS

## 2023-09-12 MED ORDER — ACETAMINOPHEN 650 MG RE SUPP: 650.0000 mg | Freq: Four times a day (QID) | RECTAL | Status: DC | PRN

## 2023-09-12 MED ORDER — ONDANSETRON HCL 4 MG PO TABS: 4.0000 mg | ORAL_TABLET | Freq: Four times a day (QID) | ORAL | Status: DC | PRN

## 2023-09-12 MED ORDER — PANTOPRAZOLE SODIUM 40 MG PO TBEC
40.0000 mg | DELAYED_RELEASE_TABLET | Freq: Every day | ORAL | Status: DC
  Administered 2023-09-12 – 2023-09-13 (×2): 40 mg via ORAL
  Filled 2023-09-12 (×2): qty 1

## 2023-09-12 MED ORDER — IRBESARTAN 150 MG PO TABS
150.0000 mg | ORAL_TABLET | Freq: Every day | ORAL | Status: DC
  Filled 2023-09-12 (×2): qty 1

## 2023-09-12 MED ORDER — ROSUVASTATIN CALCIUM 10 MG PO TABS
10.0000 mg | ORAL_TABLET | Freq: Every day | ORAL | Status: DC
  Administered 2023-09-12: 10 mg via ORAL
  Filled 2023-09-12: qty 1

## 2023-09-12 MED ORDER — ONDANSETRON HCL 4 MG/2ML IJ SOLN: 4.0000 mg | Freq: Four times a day (QID) | INTRAMUSCULAR | Status: DC | PRN

## 2023-09-12 MED ORDER — METRONIDAZOLE 500 MG PO TABS
250.0000 mg | ORAL_TABLET | Freq: Three times a day (TID) | ORAL | Status: DC
  Administered 2023-09-12 – 2023-09-13 (×3): 250 mg via ORAL
  Filled 2023-09-12 (×3): qty 1

## 2023-09-12 NOTE — Consult Note (Signed)
 Cardiology Consultation   Patient ID: Allen Espinoza MRN: [id number]; DOB: Mar 07, 1961  Admit date: 09/12/2023 Date of Consult: 09/12/2023  PCP:  Dudley Ghee, MD   [facility] Providers Cardiologist:  Bridgette Campus, MD (Inactive)        Patient Profile: Allen Espinoza is a 63 y.o. male with a hx of chronic kidney disease stage III, deep vein thrombosis (on Xarelto  with IVC filter), asthma, HIV who is being seen 09/12/2023 for the evaluation of left chest pain and dyspnea at the request of Dr. Synetta Eves.  History of Present Illness: [name] presented to the emergency department today reporting several weeks of increased left-sided chest pain that he associates with shortness of breath triggered by eating.  Patient also describes shortness of breath with physical activity such as going up steps but does not experience chest pain with exertion.  Patient has apparently been eating less since the symptoms began given that eating seems to make him feel worse.  In the emergency department, EKG with no acute ischemic changes.  Sinus rhythm with heart rate 65 bpm, 2 PVCs noted on this EKG.  Patient's initial lab work in the emergency department shows creatinine of 1.37, in range of patient's baseline.  BNP was checked and found to be normal at 53.5.  Patient also had troponin checked, negative at 5.  His complete blood count was without significant abnormalities.  Chest x-ray today in the emergency department shows stable mild to moderate enlargement of cardiac silhouette.  Chronic appearing left upper rib deformities noted.   Patient most recently seen in cardiology clinic in July 2024 by Dr. Alois Arnt.  At that time patient was reporting intermittent dizziness with elevated blood pressure.  Heart rate in clinic that day was 46, increased to 56 with physical activity and patient asymptomatic.  There was no evidence of chronotropic incompetence and no changes or further workup recommended at that  time.  Patient advised to avoid AV nodal agents in the future.  Patient was also hypertensive at this clinic visit and Norvasc  5 mg daily was started in addition to his olmesartan  20 mg.  Past Medical History:  Diagnosis Date   Abscess    Between legs   Arthritis    all over   Asthma    Avascular necrosis of femoral head, left (HCC)    Blood transfusion without reported diagnosis    Cholelithiasis    CKD (chronic kidney disease) stage 3, GFR 30-59 ml/min (HCC)    sees kidney Dr.   Carolee Churchman disorder (HCC)    left DVT   Diverticulosis    DVT (deep venous thrombosis) (HCC)    legs   Dyspnea    when walking   Dysrhythmia    remembers mother taking about having an irregular rhythm years when he was a child    GERD (gastroesophageal reflux disease)    HIV (human immunodeficiency virus infection) (HCC)    Hypertension    Morbid obesity (HCC)     Past Surgical History:  Procedure Laterality Date   COLONOSCOPY  2019   DENTAL SURGERY     had teeth pulled   HYDRADENITIS EXCISION Left 10/14/2016   Procedure: WIDE EXCISION HIDRADENITIS LEFT AXILLA;  Surgeon: Oza Blumenthal, MD;  Location: [facility] OR;  Service: General;  Laterality: Left;   JOINT REPLACEMENT     Left hip Dr. Lucienne Ryder 11/29/16   TOTAL HIP ARTHROPLASTY Left 11/29/2016   Procedure: LEFT TOTAL HIP ARTHROPLASTY ANTERIOR APPROACH;  Surgeon:  Arnie Lao, MD;  Location: [facility] ORS;  Service: Orthopedics;  Laterality: Left;     Home Medications:  Prior to Admission medications   Medication Sig Start Date End Date Taking? Authorizing Provider  acetaminophen  (TYLENOL ) 500 MG tablet Take 1,000 mg by mouth daily as needed for moderate pain (pain score 4-6) or headache.    [provider]  adalimumab  (HUMIRA , 2 PEN,) 40 MG/0.4ML pen Inject 40 mg into the skin every Thursday.    [provider]  amLODipine  (NORVASC ) 10 MG tablet Take 1 tablet (10 mg total) by mouth daily. 10/21/22 08/09/23  Bridgette Campus, MD   cabotegravir  & rilpivirine  ER (CABENUVA ) 600 & 900 MG/3ML injection Inject 1 kit into the muscle every 2 (two) months. 05/29/23 05/28/24  Sonya Duster, RPH-CPP  furosemide  (LASIX ) 40 MG tablet Take 40 mg by mouth daily.    [provider]  metroNIDAZOLE  (FLAGYL ) 250 MG tablet Take 250 mg by mouth 3 (three) times daily.    [provider]  olmesartan  (BENICAR ) 20 MG tablet Take 1 tablet by mouth once daily Patient taking differently: Take 20 mg by mouth at bedtime. 05/11/21   Sherol Dixie, MD  rosuvastatin  (CRESTOR ) 10 MG tablet TAKE 1 TABLET BY MOUTH DAILY 09/02/23   Calone, Gregory D, FNP  triamcinolone  ointment (KENALOG ) 0.1 % Apply 1 Application topically 2 (two) times daily.    [provider]  XARELTO  20 MG TABS tablet Take 20 mg by mouth daily. 11/30/21   [provider]    Scheduled Meds:  Continuous Infusions:  PRN Meds:   Allergies:    Allergies  Allergen Reactions   Sulfa Antibiotics Other (See Comments)    Hypotension    Firvanq  [Vancomycin ] Itching    OK to take with Benadryl     Social History:   Social History   Socioeconomic History   Marital status: Single    Spouse name: Not on file   Number of children: 2   Years of education: Not on file   Highest education level: Not on file  Occupational History   Occupation: Disability  Tobacco Use   Smoking status: Never   Smokeless tobacco: Never  Vaping Use   Vaping status: Never Used  Substance and Sexual Activity   Alcohol use: No    Comment: prior   Drug use: No    Comment: prior cocaine use, last 2005   Sexual activity: Not Currently    Partners: Female    Birth control/protection: Condom  Other Topics Concern   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, mailroom, warehouse work   Lives w/ fiancee   No EtOH, tobacco, drugs   Social Drivers of Corporate investment banker Strain: Medium Risk (02/18/2022)   Overall Financial  Resource Strain (CARDIA)    Difficulty of Paying Living Expenses: Somewhat hard  Food Insecurity: No Food Insecurity (04/22/2022)   Hunger Vital Sign    Worried About Running Out of Food in the Last Year: Never true    Ran Out of Food in the Last Year: Never true  Transportation Needs: No Transportation Needs (08/29/2022)   PRAPARE - Administrator, Civil Service (Medical): No    Lack of Transportation (Non-Medical): No  Physical Activity: Insufficiently Active (12/10/2017)   Exercise Vital Sign    Days of Exercise per Week: 2 days    Minutes of Exercise per Session: 10 min  Stress: No  Stress Concern Present (12/10/2017)   [employer] of Occupational Health - Occupational Stress Questionnaire    Feeling of Stress : Only a little  Social Connections: Somewhat Isolated (12/10/2017)   Social Connection and Isolation Panel    Frequency of Communication with Friends and Family: Three times a week    Frequency of Social Gatherings with Friends and Family: More than three times a week    Attends Religious Services: Never    Database administrator or Organizations: No    Attends Banker Meetings: Never    Marital Status: Living with partner  Intimate Partner Violence: Not At Risk (12/10/2017)   Humiliation, Afraid, Rape, and Kick questionnaire    Fear of Current or Ex-Partner: No    Emotionally Abused: No    Physically Abused: No    Sexually Abused: No    Family History:    Family History  Problem Relation Age of Onset   Heart attack Mother 56   Heart attack Father 35   Prostate cancer Brother    Bone cancer Maternal Aunt    Breast cancer Maternal Aunt    Colon cancer Neg Hx    Esophageal cancer Neg Hx    Stomach cancer Neg Hx    Rectal cancer Neg Hx    Migraines Neg Hx    Headache Neg Hx      ROS:  Please see the history of present illness.   All other ROS reviewed and negative.     Physical Exam/Data: Vitals:   09/12/23 0828  BP: 111/68   Pulse: 73  Resp: 18  Temp: 98.2 F (36.8 C)  TempSrc: Oral  SpO2: 100%  Weight: (!) 145.2 kg  Height: 6' (1.829 m)   No intake or output data in the 24 hours ending 09/12/23 1237    09/12/2023    8:28 AM 06/28/2023    7:26 PM 04/21/2023    8:34 AM  Last 3 Weights  Weight (lbs) 320 lb 320 lb 335 lb  Weight (kg) 145.151 kg 145.151 kg 151.955 kg     Body mass index is 43.4 kg/m.  General:  Well nourished, well developed, in no acute distress HEENT: normal Neck: no JVD Vascular: No carotid bruits; Distal pulses 2+ bilaterally Cardiac:  normal S1, S2; RRR;  soft systolic murmur  Lungs:  clear to auscultation bilaterally, no wheezing, rhonchi or rales  Abd: soft, nontender, no hepatomegaly  Ext: no edema Musculoskeletal:  No deformities, BUE and BLE strength normal and equal Skin: warm and dry  Neuro:  CNs 2-12 intact, no focal abnormalities noted Psych:  Normal affect   EKG:  The EKG was personally reviewed and demonstrates:    NSR at 65.  Occasional PVCs . No acute changes.    Telemetry:  Telemetry was personally reviewed and demonstrates:  sinus brady in low 50s   Relevant CV Studies:  05/04/19 Stress Test Nuclear stress EF: 62%. There was no ST segment deviation noted during stress. The study is normal. This is a low risk study. The left ventricular ejection fraction is normal (55-65%).  03/02/2019 TTE IMPRESSIONS     1. Left ventricular ejection fraction, by visual estimation, is 60 to  65%. The left ventricle has normal function. There is borderline left  ventricular hypertrophy.   2. Definity  contrast agent was given IV to delineate the left ventricular  endocardial borders.   3. Global right ventricle has normal systolic function.The right  ventricular size is normal. No increase  in right ventricular wall  thickness.   4. Left atrial size was normal.   5. Right atrial size was normal.   6. Trivial pericardial effusion is present.   7. The mitral valve  is normal in structure. Trace mitral valve  regurgitation. No evidence of mitral stenosis.   8. The tricuspid valve is normal in structure. Tricuspid valve  regurgitation is mild.   9. The aortic valve is normal in structure. Aortic valve regurgitation is  not visualized. No evidence of aortic valve sclerosis or stenosis.  10. The pulmonic valve was normal in structure. Pulmonic valve  regurgitation is trivial.  11. Moderately elevated pulmonary artery systolic pressure.  12. The atrial septum is grossly normal.   In comparison to the previous echocardiogram(s): 10/08/16 EF 60-65%. PA  pressure .  FINDINGS   Left Ventricle: Left ventricular ejection fraction, by visual estimation,  is 60 to 65%. The left ventricle has normal function. Definity  contrast  agent was given IV to delineate the left ventricular endocardial borders.  There is borderline left  ventricular hypertrophy.   Right Ventricle: The right ventricular size is normal. No increase in  right ventricular wall thickness. Global RV systolic function is has  normal systolic function. The tricuspid regurgitant velocity is 2.85 m/s,  and with an assumed right atrial pressure   of 8 mmHg, the estimated right ventricular systolic pressure is  moderately elevated at 40.5 mmHg.   Left Atrium: Left atrial size was normal in size.   Right Atrium: Right atrial size was normal in size   Pericardium: Trivial pericardial effusion is present.   Mitral Valve: The mitral valve is normal in structure. No evidence of  mitral valve stenosis by observation. Trace mitral valve regurgitation.   Tricuspid Valve: The tricuspid valve is normal in structure. Tricuspid  valve regurgitation is mild.   Aortic Valve: The aortic valve is normal in structure. Aortic valve  regurgitation is not visualized. The aortic valve is structurally normal,  with no evidence of sclerosis or stenosis.   Pulmonic Valve: The pulmonic valve was normal in  structure. Pulmonic valve  regurgitation is trivial.   Aorta: The aortic root and ascending aorta are structurally normal, with  no evidence of dilitation.   IAS/Shunts: The atrial septum is grossly normal.   Laboratory Data: High Sensitivity Troponin:   Recent Labs  Lab 09/12/23 0926  TROPONINIHS 5     Chemistry Recent Labs  Lab 09/12/23 0926  NA 140  K 3.6  CL 105  CO2 23  GLUCOSE 115*  BUN 16  CREATININE 1.37*  CALCIUM  9.3  GFRNONAA 58*  ANIONGAP 12    No results for input(s): PROT, ALBUMIN, AST, ALT, ALKPHOS, BILITOT in the last 168 hours. Lipids No results for input(s): CHOL, TRIG, HDL, LABVLDL, LDLCALC, CHOLHDL in the last 168 hours.  Hematology Recent Labs  Lab 09/12/23 0926  WBC 8.6  RBC 4.02*  HGB 13.6  HCT 41.5  MCV 103.2*  MCH 33.8  MCHC 32.8  RDW 12.3  PLT 201   Thyroid  No results for input(s): TSH, FREET4 in the last 168 hours.  BNP Recent Labs  Lab 09/12/23 0926  BNP 53.5    DDimer  Recent Labs  Lab 09/12/23 0926  DDIMER <0.27    Radiology/Studies:  [facility] Chest Port 1 View Result Date: 09/12/2023 CLINICAL DATA:  Chest pain and dyspnea EXAM: PORTABLE CHEST 1 VIEW COMPARISON:  06/28/2023 FINDINGS: Stable mild to moderate enlargement of the cardiopericardial silhouette. Chronic left  upper rib deformities. The lungs appear clear.  No blunting of the costophrenic angles. Lower thoracic spondylosis. IMPRESSION: 1. Stable mild to moderate enlargement of the cardiopericardial silhouette. 2. Lower thoracic spondylosis. 3. Chronic left upper rib deformities. Electronically Signed   By: Freida Jes M.D.   On: 09/12/2023 10:07     Assessment and Plan: 1.  Dyspnea on exertion: Patient presents with dyspnea on exertion that seems to be getting worse over the past several weeks.  He clearly is deconditioned.  He had a hip replacement several years ago and has not really exercise since that time.  I agree emphasized to  him that plenty of hip replacement patients exercise on a regular basis.  His troponin is normal .  We do not have the capability to perform coronary CTA over the weekend .  His symptoms sound stable and I think we can follow up with him as an OP p Will get an outpatient coronary CTA  and then an OP appt with an APP or new partner      Hypertension:  continue current meds.  He admits that he could be better with his diet and probably does eat too much salt     Risk Assessment/Risk Scores:     For questions or updates, please contact [facility] Please consult [email or web address] for contact info under

## 2023-09-12 NOTE — H&P (Addendum)
 History and Physical    Patient: Allen Espinoza HYQ:[id number] DOB: 14-Jun-1960 DOA: 09/12/2023 DOS: the patient was seen and examined on 09/12/2023 PCP: Dudley Ghee, MD  Patient coming from: Home  Chief Complaint:  Chief Complaint  Patient presents with   Chest Pain   Dizziness   Shortness of Breath   Tremors    In head   HPI: Allen Espinoza is a 63 y.o. 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  He presented to the ED today with chest pain, shortness of breath tremors in his head and dizziness that been ongoing for a while but worse over the past month.  In review of the old chart he was evaluated by cardiology in the outpatient setting in July 2024 for what was felt to be symptomatic bradycardia.  During that evaluation his heart rate increased to 56 bpm while walking and he was orthostatic and demonstrating no signs of tropic incompetence.  Recommendation was to avoid AVN blocking agents in the future.  Recommendation was to also improve BP control and keep blood pressure less than 130/80.  He was started on olmesartan  and Norvasc  at that time.  His 10-year ASCVD risk score was calculated at 17.2%.  Upon my evaluation of the patient he reports that he has continued to have similar symptoms as to the ones that required cardiology consultation.  He states they are getting worse.  He reports dizziness upon standing.  Feels better when he sitting down and resting.  He reports some nonspecific nonradiating left-sided chest pain with a sensation of feeling hot with the onset of the chest pain.  He becomes short of breath with the chest pain and he also is short of breath with walking.  He states he has not been very active since his hip replacement 2 years prior.  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  He states he is taking his Xarelto  as prescribed and workup in the ED revealed a normal D-dimer, troponin and BNP were also normal.  Chest x-ray was unremarkable EKG nonspecific without bradycardia at the time it was taken.  Telemetry has revealed some bradycardia.  There was some question of possible inferior ischemia noting he has slightly more prominent Q waves than prior EKG baseline but this could just be related to lead placement.  He does not have any lower extremity edema or orthopnea.  Also at times he will experience an unusual type of chest discomfort after eating that he describes as gurgling.  Cardiology was consulted by the EDP and they will be seeing the patient formally.  No indication at this time to transfer the patient to [facility].  Hospitalist service has been asked to evaluate the patient for admission.   Review of Systems: As mentioned in the history of present illness. All other systems reviewed and are negative. Past Medical History:  Diagnosis Date   Abscess    Between legs   Arthritis    all over   Asthma    Avascular necrosis of femoral head, left (HCC)    Blood transfusion without reported diagnosis    Cholelithiasis    CKD (chronic kidney disease) stage 3, GFR 30-59 ml/min (HCC)  sees kidney Dr.   Carolee Churchman disorder [facility])    left DVT   Diverticulosis    DVT (deep venous thrombosis) (HCC)    legs   Dyspnea    when walking   Dysrhythmia    remembers mother taking about having an irregular rhythm years when he was a child    GERD (gastroesophageal reflux disease)    HIV (human immunodeficiency virus infection) (HCC)    Hypertension    Morbid obesity (HCC)    Past Surgical History:  Procedure Laterality Date   COLONOSCOPY  2019   DENTAL SURGERY     had teeth pulled   HYDRADENITIS EXCISION Left 10/14/2016   Procedure: WIDE EXCISION  HIDRADENITIS LEFT AXILLA;  Surgeon: Oza Blumenthal, MD;  [facility];  Service: General;  Laterality: Left;   JOINT REPLACEMENT     Left hip Dr. Lucienne Ryder 11/29/16   TOTAL HIP ARTHROPLASTY Left 11/29/2016   Procedure: LEFT TOTAL HIP ARTHROPLASTY ANTERIOR APPROACH;  Surgeon: Arnie Lao, MD;  Location: [facility] ORS;  Service: Orthopedics;  Laterality: Left;   Social History:  reports that he has never smoked. He has never used smokeless tobacco. He reports that he does not drink alcohol and does not use drugs.  Allergies  Allergen Reactions   Sulfa Antibiotics Other (See Comments)    Hypotension    Firvanq  [Vancomycin ] Itching    OK to take with Benadryl     Family History  Problem Relation Age of Onset   Heart attack Mother 32   Heart attack Father 10   Prostate cancer Brother    Bone cancer Maternal Aunt    Breast cancer Maternal Aunt    Colon cancer Neg Hx    Esophageal cancer Neg Hx    Stomach cancer Neg Hx    Rectal cancer Neg Hx    Migraines Neg Hx    Headache Neg Hx     Prior to Admission medications   Medication Sig Start Date End Date Taking? Authorizing Provider  acetaminophen  (TYLENOL ) 500 MG tablet Take 1,000 mg by mouth daily as needed for moderate pain (pain score 4-6) or headache.    [provider]  adalimumab  (HUMIRA , 2 PEN,) 40 MG/0.4ML pen Inject 40 mg into the skin every Thursday.    [provider]  amLODipine  (NORVASC ) 10 MG tablet Take 1 tablet (10 mg total) by mouth daily. 10/21/22 08/09/23  Bridgette Campus, MD  cabotegravir  & rilpivirine  ER (CABENUVA ) 600 & 900 MG/3ML injection Inject 1 kit into the muscle every 2 (two) months. 05/29/23 05/28/24  Sonya Duster, RPH-CPP  furosemide  (LASIX ) 40 MG tablet Take 40 mg by mouth daily.    [provider]  metroNIDAZOLE  (FLAGYL ) 250 MG tablet Take 250 mg by mouth 3 (three) times daily.    [provider]  olmesartan  (BENICAR ) 20 MG tablet Take 1 tablet by mouth once  daily Patient taking differently: Take 20 mg by mouth at bedtime. 05/11/21   Sherol Dixie, MD  rosuvastatin  (CRESTOR ) 10 MG tablet TAKE 1 TABLET BY MOUTH DAILY 09/02/23   Calone, Gregory D, FNP  triamcinolone  ointment (KENALOG ) 0.1 % Apply 1 Application topically 2 (two) times daily.    [provider]  XARELTO  20 MG TABS tablet Take 20 mg by mouth daily. 11/30/21   [provider]    Physical Exam: Vitals:   09/12/23 0828  BP: 111/68  Pulse: 73  Resp: 18  Temp: 98.2 F (36.8 C)  TempSrc: Oral  SpO2: 100%  Weight: (!) 145.2 kg  Height: 6' (1.829 m)   Constitutional: NAD, calm, comfortable Respiratory: clear to auscultation bilaterally, no wheezing, no crackles. Normal respiratory effort. No accessory muscle use. RA Cardiovascular: Regular rate and rhythm, no murmurs / rubs / gallops. No extremity edema. 2+ pedal pulses.  Abdomen: no tenderness, no masses palpated. No hepatosplenomegaly. Bowel sounds positive.  Musculoskeletal: no clubbing / cyanosis. No joint deformity upper and lower extremities. Good ROM, no contractures. Normal muscle tone.  Skin: no rashes, lesions, ulcers. No induration Neurologic: CN 2-12 grossly intact. Sensation intact, DTR normal. Strength 5/5 x all 4 extremities.  Psychiatric: Normal judgment and insight. Alert and oriented x 3. Normal mood.     Data Reviewed:  Sodium 140, potassium 3.6, chloride 105,, BUN 16, creatinine 1.37 with a GFR of 58  BNP 53.5, first high-sensitivity troponin 5, D-dimer less than 0.27  WBC 8.6, differential not obtained, hemoglobin 13.6, MCV 103.2, platelets 201,000  Chest x-ray unremarkable  Assessment and Plan: Atypical chest pain Initial troponin is unremarkable but will continue to trend EKG essentially equivocal.  There were some prominent Q waves when compared to previous EKG but this could solely be related to lead placement.  Currently chest pain-free. Chest pain overall appears to be  atypical but it is somewhat concerning that he does have exertional shortness of breath although does not appear to be solely tied in with chest pain and may be more related to deconditioning Appreciate cardiology assistance Obtain 2D echocardiogram-last echocardiogram in 2020 demonstrated HFpEF with borderline LVH, also demonstrated moderate elevated pulmonary pressures. He reports both his mother and father had heart attacks in their 22s. 10-year risk of atherosclerotic cardiovascular disease based on the ACC/AHA protocol is 10.4% with the same 10-year risk of a person of same age being 7%.  Please note that the lipid panel utilized was from greater than 1 year ago.   Will obtain lipid panel and hemoglobin A1c for risk factor stratification  Bradycardia and dizziness Unclear if bradycardia is precipitating the dizziness or the 2 are separate issues Bradycardia is in the low 50s and BP appears to be within normal limits Patient reports dizziness with standing so as precaution we will get orthostatic vital signs-he reports he can take his blood pressure at home but does so infrequently and has not attempted to take his blood pressure during 1 of these vitals Not on AVN blocking agents and no other medications he is prescribed have any cardiovascular side effects Does have a history of prior head injuries and played football in his youth so dizziness could be secondary to chronic TBI-CT head without contrast completed in March of this year was unremarkable  Hypertension Continue home Norvasc  and olmesartan   CKD 3a Creatinine at baseline Avoid nephrotoxins  History of DVT Continue Xarelto  Presented with chest pain and D-dimer was negative and no other signs consistent with PE  HIV On [place]  prior to admission which is a once every 2 months injectable    Advance Care Planning:   Code Status: Full Code   VTE prophylaxis: Xarelto   Consults: None  Family Communication: Patient  only  Severity of Illness: The appropriate patient status for this patient is OBSERVATION. Observation status is judged to be reasonable and necessary in order to provide the required intensity of service to ensure the patient's safety. The patient's presenting symptoms, physical exam findings, and initial radiographic and laboratory data in the context of their medical condition is felt to place them  at decreased risk for further clinical deterioration. Furthermore, it is anticipated that the patient will be medically stable for discharge from the hospital within 2 midnights of admission.   Author: Kathye Parkin, NP 09/12/2023 2:13 PM  For on call review [email or web address].

## 2023-09-12 NOTE — Progress Notes (Signed)
   Coronary CTA ordered for evaluation of chest pain and dyspnea on exertion. Given Dr. Letta Raw retirement, will send results to Dr. Alda Amas (discussed with cardmaster).   Leala Prince, PA-C

## 2023-09-12 NOTE — Plan of Care (Signed)

## 2023-09-12 NOTE — ED Provider Notes (Signed)
 [place] EMERGENCY DEPARTMENT AT [facility] Provider Note   CSN: [id number] Arrival date & time: 09/12/23  4098     Patient presents with: Chest Pain, Dizziness, Shortness of Breath, and Tremors (In head)   Allen Espinoza is a 63 y.o. male.   HPI 63 yo male ho hiv, hypertension, presents today with several weeks of increasing left side chest pain with associated dyspnea that is triggered by eating.  It lasts about 10 minutes and resolves with relaxation.  Patient states he gets dyspneic going up steps but no associated chest pain. No vomiting or diarrhea.  Decreasing what he eats due to symptoms.    Patient with ho hiv on meds and no history of symptoms Patient with history dvt and on xarelto  with filter and denies ho pe.  Was off xarelto  for 3 days beginning of may for prostate biopsy since that time has not missed any doses     Prior to Admission medications   Medication Sig Start Date End Date Taking? Authorizing Provider  acetaminophen  (TYLENOL ) 500 MG tablet Take 1,000 mg by mouth daily as needed for moderate pain (pain score 4-6) or headache.    [provider]  adalimumab  (HUMIRA , 2 PEN,) 40 MG/0.4ML pen Inject 40 mg into the skin every Thursday.    [provider]  amLODipine  (NORVASC ) 10 MG tablet Take 1 tablet (10 mg total) by mouth daily. 10/21/22 08/09/23  Bridgette Campus, MD  cabotegravir  & rilpivirine  ER (CABENUVA ) 600 & 900 MG/3ML injection Inject 1 kit into the muscle every 2 (two) months. 05/29/23 05/28/24  Sonya Duster, RPH-CPP  furosemide  (LASIX ) 40 MG tablet Take 40 mg by mouth daily.    [provider]  metroNIDAZOLE  (FLAGYL ) 250 MG tablet Take 250 mg by mouth 3 (three) times daily.    [provider]  olmesartan  (BENICAR ) 20 MG tablet Take 1 tablet by mouth once daily Patient taking differently: Take 20 mg by mouth at bedtime. 05/11/21   Sherol Dixie, MD  rosuvastatin  (CRESTOR ) 10 MG tablet TAKE 1 TABLET BY  MOUTH DAILY 09/02/23   Calone, Gregory D, FNP  triamcinolone  ointment (KENALOG ) 0.1 % Apply 1 Application topically 2 (two) times daily.    [provider]  XARELTO  20 MG TABS tablet Take 20 mg by mouth daily. 11/30/21   [provider]    Allergies: Sulfa antibiotics and Firvanq  [vancomycin ]    Review of Systems  Updated Vital Signs BP 111/68 (BP Location: Left Arm)   Pulse 73   Temp 98.2 F (36.8 C) (Oral)   Resp 18   Ht 1.829 m (6')   Wt (!) 145.2 kg   SpO2 100%   BMI 43.40 kg/m   Physical Exam Vitals and nursing note reviewed.  Constitutional:      General: He is not in acute distress. HENT:     Head: Normocephalic.   Eyes:     Pupils: Pupils are equal, round, and reactive to light.    Cardiovascular:     Rate and Rhythm: Normal rate and regular rhythm.     Heart sounds: Normal heart sounds.  Pulmonary:     Effort: Pulmonary effort is normal.     Breath sounds: Normal breath sounds.  Abdominal:     General: Bowel sounds are normal.     Palpations: Abdomen is soft.   Musculoskeletal:        General: Normal range of motion.     Cervical back: Normal  range of motion.   Skin:    General: Skin is warm and dry.     Capillary Refill: Capillary refill takes less than 2 seconds.   Neurological:     General: No focal deficit present.     Mental Status: He is alert.     (all labs ordered are listed, but only abnormal results are displayed) Labs Reviewed  BASIC METABOLIC PANEL WITH GFR - Abnormal; Notable for the following components:      Result Value   Glucose, Bld 115 (*)    Creatinine, Ser 1.37 (*)    GFR, Estimated 58 (*)    All other components within normal limits  CBC - Abnormal; Notable for the following components:   RBC 4.02 (*)    MCV 103.2 (*)    All other components within normal limits  BRAIN NATRIURETIC PEPTIDE  D-DIMER, QUANTITATIVE  TROPONIN I (HIGH SENSITIVITY)  TROPONIN I (HIGH SENSITIVITY)  TROPONIN I (HIGH  SENSITIVITY)    EKG: EKG Interpretation Date/Time:  Friday September 12 2023 08:28:40 EDT Ventricular Rate:  65 PR Interval:  160 QRS Duration:  93 QT Interval:  377 QTC Calculation: 392 R Axis:   28  Text Interpretation: Sinus rhythm Multiform ventricular premature complexes Consider inferior infarct Borderline T abnormalities, inferior leads Confirmed by Auston Blush [phone]) on 09/12/2023 9:37:11 AM  Radiology: Lenell Query Chest Port 1 View Result Date: 09/12/2023 CLINICAL DATA:  Chest pain and dyspnea EXAM: PORTABLE CHEST 1 VIEW COMPARISON:  06/28/2023 FINDINGS: Stable mild to moderate enlargement of the cardiopericardial silhouette. Chronic left upper rib deformities. The lungs appear clear.  No blunting of the costophrenic angles. Lower thoracic spondylosis. IMPRESSION: 1. Stable mild to moderate enlargement of the cardiopericardial silhouette. 2. Lower thoracic spondylosis. 3. Chronic left upper rib deformities. Electronically Signed   By: Freida Jes M.D.   On: 09/12/2023 10:07     Procedures   Medications Ordered in the ED - No data to display  Clinical Course as of 09/12/23 1253  Fri Sep 12, 2023  1016 CBC reviewed interpreted and within normal limits [DR]  1103 Basic metabolic panel reviewed interpreted and significant for mildly elevated creatinine at 1.37 however stable for patient from first prior [DR]  1103 D-dimer reviewed interpreted and less than 0.27 so within normal limits BMP reviewed and 53 so doubt acute CHF exacerbation Troponin reviewed and normal [DR]  1104 Chest x-Jacey Pelc reviewed interpreted no acute abnormality noted per radiologist stable mild to moderate enlargement of the cardiopericardial silhouette, no acute abnormalities noted [DR]    Clinical Course User Index [DR] Auston Blush, MD             HEART Score: 5                    Medical Decision Making Amount and/or Complexity of Data Reviewed Labs: ordered. Radiology: ordered.  Risk Decision  regarding hospitalization.   Heart score moderate with score of 57  63 year old male presents today complaining of left-sided chest pain and dyspnea.  He does state that it gets worse with eating.  He does not have any abdominal pain.  Resolves after about 10 minutes.  It is not definitively associated with exertion however patient does get dyspneic with going up steps but is otherwise fairly sedentary.  patient at moderate risk on heart score.  EKG without acute ischemia however pain-free at that time.  Initial troponin is normal. Doubt DVT/PE patient is anticoagulated and has a normal D-dimer Chest  x-Chaseton Yepiz clear with no evidence of acute consolidation, pneumothorax, or other primary lung etiology seen on chest x-Manessa Buley.  He does have enlarged cardiac silhouette. Given that pain seems to come with eating, abdominal exam performed with no evidence of acute tenderness.  He has not had any significant weight loss. Care discussed with Dr. Chancy Comber area, on 4 [facility].  Plan consultation to hospitalist to admit and follow troponins and cardiology will consult Discussed with Kathye Parkin, on-call for hospitalist.  She will see for admission    Final diagnoses:  Chest pain, unspecified type    ED Discharge Orders     None          Auston Blush, MD 09/12/23 1253

## 2023-09-12 NOTE — ED Triage Notes (Signed)
 Pt arrives today with chest pain, shortness of breath, tremors in head and dizziness that has been ongoing for a while but worsening over the last month.

## 2023-09-13 ENCOUNTER — Other Ambulatory Visit (HOSPITAL_COMMUNITY): Payer: Self-pay

## 2023-09-13 DIAGNOSIS — R001 Bradycardia, unspecified: Secondary | ICD-10-CM | POA: Diagnosis not present

## 2023-09-13 LAB — BASIC METABOLIC PANEL WITH GFR
Anion gap: 9 (ref 5–15)
BUN: 16 mg/dL (ref 8–23)
CO2: 24 mmol/L (ref 22–32)
Calcium: 9 mg/dL (ref 8.9–10.3)
Chloride: 106 mmol/L (ref 98–111)
Creatinine, Ser: 1.22 mg/dL (ref 0.61–1.24)
GFR, Estimated: >60 mL/min (ref >60)
Glucose, Bld: 98 mg/dL (ref 70–99)
Potassium: 3.6 mmol/L (ref 3.5–5.1)
Sodium: 139 mmol/L (ref 135–145)

## 2023-09-13 MED ORDER — PANTOPRAZOLE SODIUM 40 MG PO TBEC
40.0000 mg | DELAYED_RELEASE_TABLET | Freq: Every day | ORAL | 0 refills | Status: DC
  Filled 2023-09-13: qty 30, 30d supply, fill #0

## 2023-09-13 NOTE — Progress Notes (Signed)
 AVS reviewed w/ pt who verbalized an understanding. PIV removed as noted. Pt removed from tele - notified by this RN. Pt dressed. TOC med delivered to pt in d/c area. Pt home via [place]

## 2023-09-13 NOTE — Discharge Summary (Signed)
 Physician Discharge Summary  Allen Espinoza ZOX:[id number] DOB: 09-Apr-1960 DOA: 09/12/2023  PCP: Dudley Ghee, MD  Admit date: 09/12/2023 Discharge date: 09/13/2023  Admitted From: Home Discharge disposition: Home   Recommendations for Outpatient Follow-Up:   Cardiology to arrange outpatient CTA Patient to keep a journal of his symptoms Lasix  made as needed   Discharge Diagnosis:   Principal Problem:   Symptomatic bradycardia    Discharge Condition: Improved.  Diet recommendation: Low sodium, heart healthy  Wound care: None.  Code status: Full.   History of Present Illness:   Allen Espinoza is a 63 y.o. 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  He presented to the ED today with chest pain, shortness of breath tremors in his head and dizziness that been ongoing for a while but worse over the past month.  In review of the old chart he was evaluated by cardiology in the outpatient setting in July 2024 for what was felt to be symptomatic bradycardia.  During that evaluation his heart rate increased to 56 bpm while walking and he was orthostatic and demonstrating no signs of tropic incompetence.  Recommendation was to avoid AVN blocking agents in the future.  Recommendation was to also improve BP control and keep blood pressure less than 130/80.  He was started on olmesartan  and Norvasc  at that time.  His 10-year ASCVD risk score was calculated at 17.2%.   Upon my evaluation of the patient he reports that he has continued to have similar symptoms as to the ones that required cardiology consultation.  He states they are getting worse.  He reports dizziness upon standing.  Feels better when he sitting down and resting.  He reports some nonspecific  nonradiating left-sided chest pain with a sensation of feeling hot with the onset of the chest pain.  He becomes short of breath with the chest pain and he also is short of breath with walking.  He states he has not been very active since his hip replacement 2 years prior.  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  He states he is taking his Xarelto  as prescribed and workup in the ED revealed a normal D-dimer, troponin and BNP were also normal.  Chest x-ray was unremarkable EKG nonspecific without bradycardia at the time it was taken.  Telemetry has revealed some bradycardia.  There was some question of possible inferior ischemia noting he has slightly more prominent Q waves than prior EKG baseline but this could just be related to lead placement.  He does not have any lower extremity edema or orthopnea.  Also at times he will experience an unusual type of chest discomfort after eating that he describes as gurgling.  Cardiology was consulted by the EDP and they will be seeing the patient formally.  No indication at this time to transfer the patient to [facility].  Hospitalist service has been asked to evaluate the patient for admission.   Hospital Course by Problem:   Dyspnea on exertion:  -presents with dyspnea on exertion that seems to be getting worse over the past several weeks.  He clearly is deconditioned.  He had a hip replacement several years ago and has  not really exercise since that time.   - troponin is normal . - Outpatient CTA ordered - Echo done Cardiology to follow-up on CTA       Hypertension:  continue current meds but will change Lasix  to as needed for now - Needs better control with diet  ?  GERD - Added PPI  Bradycardia - No symptoms - Outpatient follow-up with cardiology  Medical Consultants:   Cardiology   Discharge Exam:   Vitals:   09/13/23 0127 09/13/23 0529  BP:  126/63 128/77  Pulse: (!) 51 (!) 54  Resp: 20 16  Temp: 98.1 F (36.7 C) 98 F (36.7 C)  SpO2: 99% 95%   Vitals:   09/12/23 1751 09/12/23 2217 09/13/23 0127 09/13/23 0529  BP: (!) 154/85 (!) 118/52 126/63 128/77  Pulse: (!) 59 (!) 53 (!) 51 (!) 54  Resp: 16 16 20 16   Temp: 98.8 F (37.1 C) 98.2 F (36.8 C) 98.1 F (36.7 C) 98 F (36.7 C)  TempSrc:  Oral Oral Oral  SpO2: 99% 94% 99% 95%  Weight:      Height:        General exam: Appears calm and comfortable.   The results of significant diagnostics from this hospitalization (including imaging, microbiology, ancillary and laboratory) are listed below for reference.     Procedures and Diagnostic Studies:   ECHOCARDIOGRAM COMPLETE Result Date: 09/12/2023    ECHOCARDIOGRAM REPORT   Patient Name:   Allen Espinoza Date of Exam: 09/12/2023 Medical Rec #:  [id number]     Height:       72.0 in Accession #:    [id number]    Weight:       320.0 lb Date of Birth:  May 18, 1960     BSA:          2.602 m Patient Age:    62 years      BP:           111/68 mmHg Patient Gender: M             HR:           41 bpm. Exam Location:  Inpatient Procedure: 2D Echo, Cardiac Doppler and Color Doppler (Both Spectral and Color            Flow Doppler were utilized during procedure). Indications:    R94.31 Abnormal EKG  History:        Patient has prior history of Echocardiogram examinations, most                 recent 03/02/2019.  Sonographer:    AB Referring Phys: [id number] ALLISON L ELLIS IMPRESSIONS  1. Left ventricular ejection fraction, by estimation, is 60 to 65%. The left ventricle has normal function. The left ventricle has no regional wall motion abnormalities. The left ventricular internal cavity size was mildly dilated. There is mild left ventricular hypertrophy. Left ventricular diastolic parameters were normal.  2. Right ventricular systolic function is normal. The right ventricular size is normal. There is normal pulmonary artery systolic pressure. The  estimated right ventricular systolic pressure is 26.4 mmHg.  3. The mitral valve is normal in structure. Trivial mitral valve regurgitation. No evidence of mitral stenosis.  4. The aortic valve is tricuspid. Aortic valve regurgitation is not visualized. No aortic stenosis is present.  5. The inferior vena cava is normal in size with greater than 50% respiratory variability, suggesting right atrial pressure of 3 mmHg. FINDINGS  Left Ventricle: Left ventricular  ejection fraction, by estimation, is 60 to 65%. The left ventricle has normal function. The left ventricle has no regional wall motion abnormalities. The left ventricular internal cavity size was mildly dilated. There is  mild left ventricular hypertrophy. Left ventricular diastolic parameters were normal. Right Ventricle: The right ventricular size is normal. No increase in right ventricular wall thickness. Right ventricular systolic function is normal. There is normal pulmonary artery systolic pressure. The tricuspid regurgitant velocity is 2.42 m/s, and  with an assumed right atrial pressure of 3 mmHg, the estimated right ventricular systolic pressure is 26.4 mmHg. Left Atrium: Left atrial size was normal in size. Right Atrium: Right atrial size was normal in size. Pericardium: There is no evidence of pericardial effusion. Mitral Valve: The mitral valve is normal in structure. Trivial mitral valve regurgitation. No evidence of mitral valve stenosis. Tricuspid Valve: The tricuspid valve is normal in structure. Tricuspid valve regurgitation is trivial. Aortic Valve: The aortic valve is tricuspid. Aortic valve regurgitation is not visualized. No aortic stenosis is present. Aortic valve mean gradient measures 4.0 mmHg. Aortic valve peak gradient measures 7.1 mmHg. Aortic valve area, by VTI measures 2.79 cm. Pulmonic Valve: The pulmonic valve was not well visualized. Pulmonic valve regurgitation is trivial. Aorta: The aortic root and ascending aorta are  structurally normal, with no evidence of dilitation. Venous: The inferior vena cava is normal in size with greater than 50% respiratory variability, suggesting right atrial pressure of 3 mmHg. IAS/Shunts: The interatrial septum was not well visualized.  LEFT VENTRICLE PLAX 2D LVIDd:         6.10 cm   Diastology LVIDs:         3.80 cm   LV e' medial:    6.90 cm/s LV PW:         1.20 cm   LV E/e' medial:  9.8 LV IVS:        1.10 cm   LV e' lateral:   11.60 cm/s LVOT diam:     2.10 cm   LV E/e' lateral: 5.8 LV SV:         101 LV SV Index:   39 LVOT Area:     3.46 cm  RIGHT VENTRICLE             IVC RV S prime:     11.10 cm/s  IVC diam: 1.70 cm TAPSE (M-mode): 2.2 cm LEFT ATRIUM             Index LA diam:        3.70 cm 1.42 cm/m LA Vol (A2C):   72.8 ml 27.98 ml/m LA Vol (A4C):   51.3 ml 19.72 ml/m LA Biplane Vol: 62.3 ml 23.95 ml/m  AORTIC VALVE AV Area (Vmax):    2.81 cm AV Area (Vmean):   2.74 cm AV Area (VTI):     2.79 cm AV Vmax:           133.00 cm/s AV Vmean:          97.800 cm/s AV VTI:            0.363 m AV Peak Grad:      7.1 mmHg AV Mean Grad:      4.0 mmHg LVOT Vmax:         108.00 cm/s LVOT Vmean:        77.400 cm/s LVOT VTI:          0.292 m LVOT/AV VTI ratio: 0.80  AORTA Ao Root diam: 3.10 cm  Ao Asc diam:  2.80 cm MITRAL VALVE               TRICUSPID VALVE MV Area (PHT): 4.78 cm    TR Peak grad:   23.4 mmHg MV E velocity: 67.30 cm/s  TR Vmax:        242.00 cm/s MV A velocity: 61.80 cm/s MV E/A ratio:  1.09        SHUNTS                            Systemic VTI:  0.29 m                            Systemic Diam: 2.10 cm Carson Clara MD Electronically signed by Carson Clara MD Signature Date/Time: 09/12/2023/5:02:57 PM    Final    DG Chest Port 1 View Result Date: 09/12/2023 CLINICAL DATA:  Chest pain and dyspnea EXAM: PORTABLE CHEST 1 VIEW COMPARISON:  06/28/2023 FINDINGS: Stable mild to moderate enlargement of the cardiopericardial silhouette. Chronic left upper rib deformities.  The lungs appear clear.  No blunting of the costophrenic angles. Lower thoracic spondylosis. IMPRESSION: 1. Stable mild to moderate enlargement of the cardiopericardial silhouette. 2. Lower thoracic spondylosis. 3. Chronic left upper rib deformities. Electronically Signed   By: Freida Jes M.D.   On: 09/12/2023 10:07     Labs:   Basic Metabolic Panel: Recent Labs  Lab 09/12/23 0926 09/13/23 0357  NA 140 139  K 3.6 3.6  CL 105 106  CO2 23 24  GLUCOSE 115* 98  BUN 16 16  CREATININE 1.37* 1.22  CALCIUM  9.3 9.0   GFR Estimated Creatinine Clearance: 90.5 mL/min (by C-G formula based on SCr of 1.22 mg/dL). Liver Function Tests: No results for input(s): AST, ALT, ALKPHOS, BILITOT, PROT, ALBUMIN in the last 168 hours. No results for input(s): LIPASE, AMYLASE in the last 168 hours. No results for input(s): AMMONIA in the last 168 hours. Coagulation profile No results for input(s): INR, PROTIME in the last 168 hours.  CBC: Recent Labs  Lab 09/12/23 0926  WBC 8.6  HGB 13.6  HCT 41.5  MCV 103.2*  PLT 201   Cardiac Enzymes: No results for input(s): CKTOTAL, CKMB, CKMBINDEX, TROPONINI in the last 168 hours. BNP: Invalid input(s): POCBNP CBG: No results for input(s): GLUCAP in the last 168 hours. D-Dimer Recent Labs    09/12/23 0926  DDIMER <0.27   Hgb A1c Recent Labs    09/12/23 1836  HGBA1C 5.4   Lipid Profile Recent Labs    09/12/23 1836  CHOL 109  HDL 58  LDLCALC 39  TRIG 58  CHOLHDL 1.9   Thyroid  function studies No results for input(s): TSH, T4TOTAL, T3FREE, THYROIDAB in the last 72 hours.  Invalid input(s): FREET3 Anemia work up No results for input(s): VITAMINB12, FOLATE, FERRITIN, TIBC, IRON, RETICCTPCT in the last 72 hours. Microbiology No results found for this or any previous visit (from the past 240 hours).   Discharge Instructions:   Discharge Instructions     Diet - low  sodium heart healthy   Complete by: As directed    Discharge instructions   Complete by: As directed    outpatient coronary CTA  and then an OP appt with a cardiologist   Increase activity slowly   Complete by: As directed       Allergies as of 09/13/2023  Reactions   Sulfa Antibiotics Other (See Comments)   Hypotension    Firvanq  [vancomycin ] Itching   OK to take with Benadryl         Medication List     PAUSE taking these medications    furosemide  40 MG tablet Wait to take this until your doctor or other care provider tells you to start again. Commonly known as: LASIX  Take 40 mg by mouth daily.       TAKE these medications    acetaminophen  500 MG tablet Commonly known as: TYLENOL  Take 1,000 mg by mouth daily as needed for moderate pain (pain score 4-6) or headache.   amLODipine  10 MG tablet Commonly known as: NORVASC  Take 1 tablet (10 mg total) by mouth daily.   Cabenuva  600 & 900 MG/3ML injection Generic drug: cabotegravir  & rilpivirine  ER Inject 1 kit into the muscle every 2 (two) months.   Humira  (2 Pen) 40 MG/0.4ML pen Generic drug: adalimumab  Inject 40 mg into the skin every Thursday.   latanoprost 0.005 % ophthalmic solution Commonly known as: XALATAN Place 1 drop into both eyes at bedtime.   metroNIDAZOLE  250 MG tablet Commonly known as: FLAGYL  Take 250 mg by mouth 3 (three) times daily.   multivitamin with minerals Tabs tablet Take 1 tablet by mouth daily.   olmesartan  20 MG tablet Commonly known as: BENICAR  Take 1 tablet by mouth once daily What changed: when to take this   pantoprazole  40 MG tablet Commonly known as: PROTONIX  Take 1 tablet (40 mg total) by mouth daily.   rosuvastatin  10 MG tablet Commonly known as: CRESTOR  TAKE 1 TABLET BY MOUTH DAILY What changed: when to take this   triamcinolone  ointment 0.1 % Commonly known as: KENALOG  Apply 1 Application topically 2 (two) times daily.   Xarelto  20 MG Tabs  tablet Generic drug: rivaroxaban  Take 20 mg by mouth daily.          Time coordinating discharge: 45 min  Signed:  Enrigue Harvard DO  [facility] 09/13/2023, 8:25 AM

## 2023-09-13 NOTE — Plan of Care (Signed)
  Problem: Education: Goal: Knowledge of General Education information will improve Description: Including pain rating scale, medication(s)/side effects and non-pharmacologic comfort measures 09/13/2023 0805 by Kerwin Peels, RN Outcome: Progressing 09/12/2023 1830 by Kerwin Peels, RN Outcome: Progressing   Problem: Health Behavior/Discharge Planning: Goal: Ability to manage health-related needs will improve 09/13/2023 0805 by Kerwin Peels, RN Outcome: Progressing 09/12/2023 1830 by Kerwin Peels, RN Outcome: Progressing   Problem: Clinical Measurements: Goal: Ability to maintain clinical measurements within normal limits will improve 09/13/2023 0805 by Kerwin Peels, RN Outcome: Progressing 09/12/2023 1830 by Kerwin Peels, RN Outcome: Progressing Goal: Will remain free from infection 09/13/2023 0805 by Kerwin Peels, RN Outcome: Progressing 09/12/2023 1830 by Kerwin Peels, RN Outcome: Progressing Goal: Diagnostic test results will improve 09/13/2023 0805 by Kerwin Peels, RN Outcome: Progressing 09/12/2023 1830 by Kerwin Peels, RN Outcome: Progressing

## 2023-09-17 DIAGNOSIS — R42 Dizziness and giddiness: Secondary | ICD-10-CM | POA: Diagnosis not present

## 2023-09-23 ENCOUNTER — Encounter (HOSPITAL_COMMUNITY): Payer: Self-pay

## 2023-09-23 DIAGNOSIS — I10 Essential (primary) hypertension: Secondary | ICD-10-CM | POA: Diagnosis not present

## 2023-09-23 DIAGNOSIS — J209 Acute bronchitis, unspecified: Secondary | ICD-10-CM | POA: Diagnosis not present

## 2023-09-23 DIAGNOSIS — R42 Dizziness and giddiness: Secondary | ICD-10-CM | POA: Diagnosis not present

## 2023-09-24 ENCOUNTER — Telehealth (HOSPITAL_COMMUNITY): Payer: Self-pay | Admitting: Emergency Medicine

## 2023-09-24 NOTE — Telephone Encounter (Signed)
 Reaching out to patient to offer assistance regarding upcoming cardiac imaging study; pt verbalizes understanding of appt date/time, parking situation and where to check in, pre-test NPO status and medications ordered, and verified current allergies; name and call back number provided for further questions should they arise Rockwell Alexandria RN Navigator Cardiac Imaging [facility] [phone] office [phone] cell

## 2023-09-25 ENCOUNTER — Ambulatory Visit (HOSPITAL_COMMUNITY)
Admission: RE | Admit: 2023-09-25 | Discharge: 2023-09-25 | Disposition: A | Discharge status: Home / Self Care | Source: Ambulatory Visit | Attending: Cardiology | Admitting: Cardiology

## 2023-09-25 DIAGNOSIS — N62 Hypertrophy of breast: Secondary | ICD-10-CM | POA: Diagnosis not present

## 2023-09-25 DIAGNOSIS — I2510 Atherosclerotic heart disease of native coronary artery without angina pectoris: Secondary | ICD-10-CM | POA: Diagnosis not present

## 2023-09-25 DIAGNOSIS — R072 Precordial pain: Secondary | ICD-10-CM | POA: Diagnosis not present

## 2023-09-25 MED ORDER — NITROGLYCERIN 0.4 MG SL SUBL
0.8000 mg | SUBLINGUAL_TABLET | Freq: Once | SUBLINGUAL | Status: AC
  Administered 2023-09-25: 0.8 mg via SUBLINGUAL

## 2023-09-25 MED ORDER — IOHEXOL 350 MG/ML SOLN
100.0000 mL | Freq: Once | INTRAVENOUS | Status: AC | PRN
  Administered 2023-09-25: 100 mL via INTRAVENOUS

## 2023-09-28 ENCOUNTER — Ambulatory Visit: Payer: Self-pay | Admitting: Cardiology

## 2023-09-28 DIAGNOSIS — I2510 Atherosclerotic heart disease of native coronary artery without angina pectoris: Secondary | ICD-10-CM

## 2023-09-28 DIAGNOSIS — Z9189 Other specified personal risk factors, not elsewhere classified: Secondary | ICD-10-CM

## 2023-09-29 ENCOUNTER — Telehealth: Payer: Self-pay | Admitting: Cardiology

## 2023-09-29 NOTE — Telephone Encounter (Signed)
Pt returning call to nurse for results

## 2023-09-30 ENCOUNTER — Emergency Department (HOSPITAL_COMMUNITY)
Admission: EM | Admit: 2023-09-30 | Discharge: 2023-09-30 | Disposition: A | Discharge status: Home / Self Care | Attending: Emergency Medicine | Admitting: Emergency Medicine

## 2023-09-30 ENCOUNTER — Encounter (HOSPITAL_COMMUNITY): Payer: Self-pay

## 2023-09-30 DIAGNOSIS — L0231 Cutaneous abscess of buttock: Secondary | ICD-10-CM | POA: Insufficient documentation

## 2023-09-30 DIAGNOSIS — L0291 Cutaneous abscess, unspecified: Secondary | ICD-10-CM

## 2023-09-30 MED ORDER — CEPHALEXIN 500 MG PO CAPS
500.0000 mg | ORAL_CAPSULE | Freq: Once | ORAL | Status: AC
  Administered 2023-09-30: 500 mg via ORAL
  Filled 2023-09-30: qty 1

## 2023-09-30 MED ORDER — OXYCODONE-ACETAMINOPHEN 5-325 MG PO TABS
1.0000 | ORAL_TABLET | Freq: Once | ORAL | Status: AC
  Administered 2023-09-30: 1 via ORAL
  Filled 2023-09-30: qty 1

## 2023-09-30 MED ORDER — LIDOCAINE-EPINEPHRINE (PF) 2 %-1:200000 IJ SOLN
10.0000 mL | Freq: Once | INTRAMUSCULAR | Status: AC
  Administered 2023-09-30: 10 mL
  Filled 2023-09-30: qty 20

## 2023-09-30 MED ORDER — CEPHALEXIN 500 MG PO CAPS: 500.0000 mg | ORAL_CAPSULE | Freq: Four times a day (QID) | ORAL | 0 refills | Status: DC

## 2023-09-30 MED ORDER — OXYCODONE-ACETAMINOPHEN 5-325 MG PO TABS: 1.0000 | ORAL_TABLET | Freq: Four times a day (QID) | ORAL | 0 refills | Status: DC | PRN

## 2023-09-30 NOTE — ED Triage Notes (Signed)
 Pt arrived reporting boil on right buttock X1 week. Denies fever, drainage or any other symptoms.

## 2023-09-30 NOTE — ED Provider Notes (Signed)
 [place] EMERGENCY DEPARTMENT AT [facility] Provider Note   CSN: [id number] Arrival date & time: 09/30/23  9281     Patient presents with: Abscess   Allen Espinoza is a 63 y.o. male.   Patient to ED complains of recurrent abscess to right buttock near the anus. He has a history of hidradenitis. Same location was opened about a year ago and he feels it never completely healed. No fever. The area has not been draining.   The history is provided by the patient. No language interpreter was used.  Abscess      Prior to Admission medications   Medication Sig Start Date End Date Taking? Authorizing Provider  cephALEXin  (KEFLEX ) 500 MG capsule Take 1 capsule (500 mg total) by mouth 4 (four) times daily. 09/30/23  Yes Odell Balls, PA-C  oxyCODONE -acetaminophen  (PERCOCET/ROXICET) 5-325 MG tablet Take 1 tablet by mouth every 6 (six) hours as needed for severe pain (pain score 7-10). 09/30/23  Yes Ladesha Pacini, PA-C  acetaminophen  (TYLENOL ) 500 MG tablet Take 1,000 mg by mouth daily as needed for moderate pain (pain score 4-6) or headache.    [provider]  adalimumab  (HUMIRA , 2 PEN,) 40 MG/0.4ML pen Inject 40 mg into the skin every Thursday.    [provider]  amLODipine  (NORVASC ) 10 MG tablet Take 1 tablet (10 mg total) by mouth daily. 10/21/22 09/12/23  Alvan Ronal BRAVO, MD  cabotegravir  & rilpivirine  ER (CABENUVA ) 600 & 900 MG/3ML injection Inject 1 kit into the muscle every 2 (two) months. 05/29/23 05/28/24  Waddell Alan PARAS, RPH-CPP  furosemide  (LASIX ) 40 MG tablet Take 40 mg by mouth daily.    [provider]  latanoprost (XALATAN) 0.005 % ophthalmic solution Place 1 drop into both eyes at bedtime. 08/13/23   [provider]  metroNIDAZOLE  (FLAGYL ) 250 MG tablet Take 250 mg by mouth 3 (three) times daily.    [provider]  Multiple Vitamin (MULTIVITAMIN WITH MINERALS) TABS tablet Take 1 tablet by mouth daily.    [provider]  olmesartan  (BENICAR ) 20 MG tablet Take 1 tablet by mouth once daily Patient taking differently: Take 20 mg by mouth at bedtime. 05/11/21   Trudy Mliss Dragon, MD  pantoprazole  (PROTONIX ) 40 MG tablet Take 1 tablet (40 mg total) by mouth daily. 09/13/23   Vann, Jessica U, DO  rosuvastatin  (CRESTOR ) 10 MG tablet TAKE 1 TABLET BY MOUTH DAILY Patient taking differently: Take 10 mg by mouth at bedtime. 09/02/23   Calone, Gregory D, FNP  triamcinolone  ointment (KENALOG ) 0.1 % Apply 1 Application topically 2 (two) times daily.    [provider]  XARELTO  20 MG TABS tablet Take 20 mg by mouth daily. 11/30/21   [provider]    Allergies: Sulfa antibiotics and Firvanq  [vancomycin ]    Review of Systems  Updated Vital Signs BP (!) 112/55 (BP Location: Left Arm)   Pulse 61   Temp 97.7 F (36.5 C) (Oral)   Resp 16   Ht 6' (1.829 m)   Wt (!) 145 kg   SpO2 100%   BMI 43.35 kg/m   Physical Exam Constitutional:      Appearance: He is well-developed.  Pulmonary:     Effort: Pulmonary effort is normal.   Musculoskeletal:        General: Normal range of motion.     Cervical back: Normal range of motion.   Skin:    General: Skin is warm and dry.  Comments: Area of edema and fluctuance to crest of right buttock. Bleeding from lower border without purulence. No surrounding erythema. No tenderness or induration near the anus.    Neurological:     Mental Status: He is alert and oriented to person, place, and time.     (all labs ordered are listed, but only abnormal results are displayed) Labs Reviewed - No data to display  EKG: None  Radiology: No results found.   .Incision and Drainage  Date/Time: 09/30/2023 9:17 AM  Performed by: Odell Balls, PA-C Authorized by: Odell Balls, PA-C   Consent:    Consent obtained:  Verbal Universal protocol:    Procedure explained and questions answered to patient or proxy's satisfaction: yes     Patient identity  confirmed:  Verbally with patient Location:    Type:  Abscess Pre-procedure details:    Skin preparation:  Povidone-iodine Anesthesia:    Anesthesia method:  Local infiltration   Local anesthetic:  Lidocaine  2% WITH epi Procedure type:    Complexity:  Simple Procedure details:    Incision types:  Single straight   Wound management:  Probed and deloculated and irrigated with saline   Drainage:  Bloody   Wound treatment:  Wound left open Post-procedure details:    Procedure completion:  Tolerated well, no immediate complications    Medications Ordered in the ED  cephALEXin  (KEFLEX ) capsule 500 mg (has no administration in time range)  lidocaine -EPINEPHrine  (XYLOCAINE  W/EPI) 2 %-1:200000 (PF) injection 10 mL (10 mLs Infiltration Given 09/30/23 0834)  oxyCODONE -acetaminophen  (PERCOCET/ROXICET) 5-325 MG per tablet 1 tablet (1 tablet Oral Given 09/30/23 0828)    Clinical Course as of 09/30/23 0925  Tue Sep 30, 2023  0918 Patient with h/o hidradenitis, now with recurrent abscess to right buttock. There is an area of bleeding but no purulence. I&D did not produce any pus. No fever, no changes of cellulitis. No exam findings to suggest perianal/rectal component. Do not feel CT is indicated. Will give abx. Refer to surgery as this area may be amenable to larger excision given size and recurrence.  [SU]    Clinical Course User Index [SU] Odell Balls, PA-C                                 Medical Decision Making Risk Prescription drug management.        Final diagnoses:  Abscess    ED Discharge Orders          Ordered    cephALEXin  (KEFLEX ) 500 MG capsule  4 times daily        09/30/23 0923    oxyCODONE -acetaminophen  (PERCOCET/ROXICET) 5-325 MG tablet  Every 6 hours PRN        09/30/23 0923               Odell Balls, PA-C 09/30/23 9074    Tegeler, Lonni PARAS, MD 09/30/23 [phone]

## 2023-09-30 NOTE — Discharge Instructions (Addendum)
 As we discussed, please make an appointment with [facility] for consideration of a wider excision of this large area to the right buttock.   Take the medications as prescribed. Return to the ED with any severe pain, high fever or new concern.

## 2023-09-30 NOTE — Telephone Encounter (Signed)
 Called and left patient message to call office for results.

## 2023-10-02 ENCOUNTER — Other Ambulatory Visit: Payer: Self-pay

## 2023-10-02 ENCOUNTER — Other Ambulatory Visit (HOSPITAL_COMMUNITY): Payer: Self-pay

## 2023-10-02 DIAGNOSIS — M5459 Other low back pain: Secondary | ICD-10-CM | POA: Diagnosis not present

## 2023-10-02 DIAGNOSIS — R1032 Left lower quadrant pain: Secondary | ICD-10-CM | POA: Diagnosis not present

## 2023-10-02 NOTE — Progress Notes (Signed)
 Specialty Pharmacy Refill Coordination Note  Allen Espinoza is a 63 y.o. male assessed today regarding refills of clinic administered specialty medication(s) Cabotegravir  & Rilpivirine  (CABENUVA )   Clinic requested Courier to Provider Office   Delivery date: 10/08/23   Verified address: [address] [place] [address]   Medication will be filled on 10/07/23.

## 2023-10-06 NOTE — Telephone Encounter (Signed)
 Called and results given  to patient and patient verbalized understanding. Appointment made with APP

## 2023-10-06 NOTE — Unmapped (Signed)
 [facility] Refill Coordination Note    Specialty Medication(s) to be Shipped:   Inflammatory Disorders: Humira     Other medication(s) to be shipped: No additional medications requested for fill at this time     Westley Lex, DOB: 08/30/1960  Phone: There are no phone numbers on file.      All above HIPAA information was verified with patient.     Was a Nurse, learning disability used for this call? No    Completed refill call assessment today to schedule patient's medication shipment from the [facility]  [phone]).  All relevant notes have been reviewed.     Specialty medication(s) and dose(s) confirmed: Regimen is correct and unchanged.   Changes to medications: Jasin reports no changes at this time.  Changes to insurance: No  New side effects reported not previously addressed with a pharmacist or physician: None reported  Questions for the pharmacist: No    Confirmed patient received a Conservation officer, historic buildings and a Surveyor, mining with first shipment. The patient will receive a drug information handout for each medication shipped and additional FDA Medication Guides as required.       DISEASE/MEDICATION-SPECIFIC INFORMATION        For patients on injectable medications: Patient currently has 0 doses left.  Next injection is scheduled for 10/09/23.    SPECIALTY MEDICATION ADHERENCE     Medication Adherence    Patient reported X missed doses in the last month: 0  Specialty Medication: HUMIRA (CF) PEN 40 mg/0.4 mL injection (adalimumab )  Patient is on additional specialty medications: No  Patient is on more than two specialty medications: No  Support network for adherence: family member              Were doses missed due to medication being on hold? No    adalimumab : HUMIRA (CF) PEN 40 mg/0.4 mL injection  : 0 days of medicine on hand       REFERRAL TO PHARMACIST     Referral to the pharmacist: Not needed      [facility]     Shipping address confirmed in Epic.     Cost and Payment: Patient has a copay of $4. They are aware and have authorized the pharmacy to charge the credit card on file.    Delivery Scheduled: Yes, Expected medication delivery date: 10/08/23.     Medication will be delivered via UPS to the prescription address in Epic WAM.    Lonni Skeens   [facility]  Specialty Technician

## 2023-10-07 ENCOUNTER — Other Ambulatory Visit: Payer: Self-pay

## 2023-10-07 MED FILL — HUMIRA PEN CITRATE FREE 40 MG/0.4 ML: SUBCUTANEOUS | 28 days supply | Qty: 4 | Fill #1

## 2023-10-08 ENCOUNTER — Telehealth: Payer: Self-pay

## 2023-10-08 NOTE — Telephone Encounter (Signed)
 [facility] Patient Advocate Encounter  Patient's medications CABENUVA  have been couriered to [facility] from [facility] and will be administered at the patients appointment on 10/14/23.  Charmaine Sharps, CPhT Specialty Pharmacy Patient [facility] Phone: [phone] Fax:  [phone]

## 2023-10-14 ENCOUNTER — Encounter: Payer: Self-pay | Admitting: Family

## 2023-10-14 ENCOUNTER — Other Ambulatory Visit: Payer: Self-pay

## 2023-10-14 ENCOUNTER — Ambulatory Visit: Payer: Self-pay | Admitting: Family

## 2023-10-14 VITALS — BP 136/65 | HR 41 | Temp 97.4°F | Ht 72.0 in | Wt 285.0 lb

## 2023-10-14 DIAGNOSIS — N1831 Chronic kidney disease, stage 3a: Secondary | ICD-10-CM

## 2023-10-14 DIAGNOSIS — Z79899 Other long term (current) drug therapy: Secondary | ICD-10-CM | POA: Diagnosis not present

## 2023-10-14 DIAGNOSIS — Z21 Asymptomatic human immunodeficiency virus [HIV] infection status: Secondary | ICD-10-CM

## 2023-10-14 DIAGNOSIS — R634 Abnormal weight loss: Secondary | ICD-10-CM

## 2023-10-14 DIAGNOSIS — R001 Bradycardia, unspecified: Secondary | ICD-10-CM

## 2023-10-14 DIAGNOSIS — B20 Human immunodeficiency virus [HIV] disease: Secondary | ICD-10-CM | POA: Diagnosis present

## 2023-10-14 MED ORDER — CABOTEGRAVIR & RILPIVIRINE ER 600 & 900 MG/3ML IM SUER
1.0000 | Freq: Once | INTRAMUSCULAR | Status: AC
  Administered 2023-10-14: 1 via INTRAMUSCULAR

## 2023-10-14 NOTE — Patient Instructions (Addendum)
 Nice to see you.  We will check your lab work today.  Recommend getting a scale to monitor weight at home.   Follow up with cardiology as planned. If symptoms worsen or occur at rest would recommend further/emergent care.   Plan for follow up in 6 months or sooner if needed with lab work on the same day and with pharmacy provider in between.   Have a great day and stay safe!

## 2023-10-14 NOTE — Assessment & Plan Note (Signed)
 Allen Espinoza has unintentional weight loss over the past few months of unclear etiology. Have encouraged him to get a scale for home use to monitor weight as it may be as simple as differing scales however cannot exclude underlying weight loss causes and monitoring weight will help to determine. Cancer screenings are up to date. No changes to eating habits. Will continue to monitor at this time. If weight loss continues may need to consider further work up with imaging.

## 2023-10-14 NOTE — Assessment & Plan Note (Signed)
 Most recent lab work borderline for CKD Stage 3a with eGFR of 59. Check renal function and optimize control of co-morbid conditions.

## 2023-10-14 NOTE — Assessment & Plan Note (Signed)
 Mr. Duddy continues to have well-controlled virus with good adherence and tolerance to Cabenuva .  Reviewed previous lab work and discussed plan of care and U equals U.  Covered by Medicaid.  Social determinants of health reviewed with no interventions indicated.  Check blood work.  Continue current dose of Cabenuva  with every 2 month injections. Plan for follow up in 6 months or sooner if needed and with pharmacy provider in between.

## 2023-10-14 NOTE — Progress Notes (Signed)
 Brief Narrative   Patient ID: CHANCELOR Espinoza, male    DOB: 04/30/1960, 63 y.o.   MRN: [id number]  Mr. Enriques is a 63 y/o male with HIV disease diagnosed in 2007 with risk factor of heterosexual contact. Initial viral load and CD4 count are unavailable.Genotype with no medication resistant mutations. No history of opportunistic infection. ART history with Genvoya  and Prezista  (DDI with Xarelto ) and Biktarvy . Now on Cabenuva .   Subjective:   Chief Complaint  Patient presents with   Follow-up    B20    HPI:  Allen Espinoza is a 63 y.o. male with HIV disease last seen on 08/13/23 by Alan Geralds, RPH-CPP with good adherence and tolerance to Cabenuva . Previous lab work with viral load undetectable and CD4 count 478. RPR was non-reactive. Liver function and electrolytes normal. Renal function stable with creatinine 1.35 and eGFR 59. Here today for follow up and next injection.   Allen Espinoza has been doing okay since his last office visit and was hospitalized for bradycardia. Continues to receive Cabenuva  as prescribed with no adverse side effects. Covered by Medicaid. Has continued concern for shortness of breath and dizziness with activity that is improved with rest. Has also noted significant weight loss between the hospital and today and his clothes are fitting looser. Occasional tingling type chest pain. No fevers or chills but does have occasional night sweats. Colon cancer screening is up to date and recently had a prostate biopsy that was negative for malignancy.  Eating and drinking adequately.  Wt Readings from Last 3 Encounters:  10/14/23 285 lb (129.3 kg)  09/30/23 (!) 319 lb 10.7 oz (145 kg)  09/12/23 (!) 320 lb (145.2 kg)   Housing, transportation and access to food are stable. Healthcare maintenance reviewed. Condoms and site specific STD testing offered.   Denies changes in vision, neck pain/stiffness, nausea, diarrhea, vomiting, lesions or rashes.  Lab Results  Component Value Date    CD4TCELL 24 (L) 04/21/2023   CD4TABS 478 04/21/2023   Lab Results  Component Value Date   HIV1RNAQUANT <20 (H) 04/21/2023     Allergies  Allergen Reactions   Sulfa Antibiotics Other (See Comments)    Hypotension    Firvanq  [Vancomycin ] Itching    OK to take with Benadryl       Outpatient Medications Prior to Visit  Medication Sig Dispense Refill   acetaminophen  (TYLENOL ) 500 MG tablet Take 1,000 mg by mouth daily as needed for moderate pain (pain score 4-6) or headache.     adalimumab  (HUMIRA , 2 PEN,) 40 MG/0.4ML pen Inject 40 mg into the skin every Thursday.     amLODipine  (NORVASC ) 10 MG tablet Take 1 tablet (10 mg total) by mouth daily. 180 tablet 3   cabotegravir  & rilpivirine  ER (CABENUVA ) 600 & 900 MG/3ML injection Inject 1 kit into the muscle every 2 (two) months. 6 mL 5   latanoprost (XALATAN) 0.005 % ophthalmic solution Place 1 drop into both eyes at bedtime.     metroNIDAZOLE  (FLAGYL ) 250 MG tablet Take 250 mg by mouth 3 (three) times daily.     Multiple Vitamin (MULTIVITAMIN WITH MINERALS) TABS tablet Take 1 tablet by mouth daily.     olmesartan  (BENICAR ) 20 MG tablet Take 1 tablet by mouth once daily 90 tablet 3   rosuvastatin  (CRESTOR ) 10 MG tablet TAKE 1 TABLET BY MOUTH DAILY 90 tablet 0   triamcinolone  ointment (KENALOG ) 0.1 % Apply 1 Application topically 2 (two) times daily.     XARELTO   20 MG TABS tablet Take 20 mg by mouth daily.     metroNIDAZOLE  (FLAGYL ) 250 MG tablet Take 250 mg by mouth 3 (three) times daily.     furosemide  (LASIX ) 40 MG tablet Take 40 mg by mouth daily.     cephALEXin  (KEFLEX ) 500 MG capsule Take 1 capsule (500 mg total) by mouth 4 (four) times daily. (Patient not taking: Reported on 10/14/2023) 28 capsule 0   oxyCODONE -acetaminophen  (PERCOCET/ROXICET) 5-325 MG tablet Take 1 tablet by mouth every 6 (six) hours as needed for severe pain (pain score 7-10). (Patient not taking: Reported on 10/14/2023) 10 tablet 0   pantoprazole  (PROTONIX ) 40 MG  tablet Take 1 tablet (40 mg total) by mouth daily. (Patient not taking: Reported on 10/14/2023) 30 tablet 0   No facility-administered medications prior to visit.     Past Medical History:  Diagnosis Date   Abscess    Between legs   Arthritis    all over   Asthma    Avascular necrosis of femoral head, left (HCC)    Blood transfusion without reported diagnosis    Cholelithiasis    CKD (chronic kidney disease) stage 3, GFR 30-59 ml/min (HCC)    sees kidney Dr.   Merceda disorder (HCC)    left DVT   Diverticulosis    DVT (deep venous thrombosis) (HCC)    legs   Dyspnea    when walking   Dysrhythmia    remembers mother taking about having an irregular rhythm years when he was a child    GERD (gastroesophageal reflux disease)    HIV (human immunodeficiency virus infection) (HCC)    Hypertension    Morbid obesity (HCC)      Past Surgical History:  Procedure Laterality Date   COLONOSCOPY  2019   DENTAL SURGERY     had teeth pulled   HYDRADENITIS EXCISION Left 10/14/2016   Procedure: WIDE EXCISION HIDRADENITIS LEFT AXILLA;  Surgeon: Vernetta Berg, MD;  Location: [facility] OR;  Service: General;  Laterality: Left;   JOINT REPLACEMENT     Left hip Dr. Vernetta 11/29/16   TOTAL HIP ARTHROPLASTY Left 11/29/2016   Procedure: LEFT TOTAL HIP ARTHROPLASTY ANTERIOR APPROACH;  Surgeon: Vernetta Lonni GRADE, MD;  Location: [facility];  Service: Orthopedics;  Laterality: Left;        Review of Systems  Constitutional:  Negative for appetite change, chills, fatigue, fever and unexpected weight change.  Eyes:  Negative for visual disturbance.  Respiratory:  Negative for cough, chest tightness, shortness of breath and wheezing.   Cardiovascular:  Negative for chest pain and leg swelling.  Gastrointestinal:  Negative for abdominal pain, constipation, diarrhea, nausea and vomiting.  Genitourinary:  Negative for dysuria, flank pain, frequency, genital sores, hematuria and urgency.  Skin:   Negative for rash.  Allergic/Immunologic: Negative for immunocompromised state.  Neurological:  Negative for dizziness and headaches.     Objective:   BP 136/65   Pulse (!) 41 Comment: provider notified  Temp (!) 97.4 F (36.3 C) (Temporal)   Ht 6' (1.829 m)   Wt 285 lb (129.3 kg)   SpO2 98%   BMI 38.65 kg/m  Nursing note and vital signs reviewed.  Physical Exam Constitutional:      General: He is not in acute distress.    Appearance: He is well-developed.  Eyes:     Conjunctiva/sclera: Conjunctivae normal.  Cardiovascular:     Rate and Rhythm: Bradycardia present. Rhythm irregular.     Heart sounds: Normal heart  sounds. No murmur heard.    No friction rub. No gallop.  Pulmonary:     Effort: Pulmonary effort is normal. No respiratory distress.     Breath sounds: Normal breath sounds. No wheezing or rales.  Chest:     Chest wall: No tenderness.  Abdominal:     General: Bowel sounds are normal.     Palpations: Abdomen is soft.     Tenderness: There is no abdominal tenderness.  Musculoskeletal:     Cervical back: Neck supple.  Lymphadenopathy:     Cervical: No cervical adenopathy.  Skin:    General: Skin is warm and dry.     Findings: No rash.  Neurological:     Mental Status: He is alert and oriented to person, place, and time.  Psychiatric:        Mood and Affect: Mood normal.          04/21/2023    8:39 AM 10/17/2022    8:28 AM 04/25/2022    9:44 AM 01/10/2022   10:16 AM 12/12/2021    8:32 AM  Depression screen PHQ 2/9  Decreased Interest 0 0 0 0 0  Down, Depressed, Hopeless 0 0 0 0 0  PHQ - 2 Score 0 0 0 0 0         No data to display           The ASCVD Risk score (Arnett DK, et al., 2019) failed to calculate for the following reasons:   The valid total cholesterol range is 130 to 320 mg/dL      Assessment & Plan:    Patient Active Problem List   Diagnosis Date Noted   Weight loss 10/14/2023   At high risk for cardiovascular disease  10/17/2022   Symptomatic bradycardia 10/17/2022   Subdural hematoma (HCC) 11/12/2021   Asthma    GERD (gastroesophageal reflux disease)    SDH (subdural hematoma) (HCC) 10/24/2021   Multiple lipomas 03/10/2021   Healthcare maintenance 10/13/2020   Numbness 09/28/2020   Subcutaneous nodules 03/01/2019   Hypertension 10/19/2018   Sleep disorder 03/05/2018   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) 08/19/2016   Morbid obesity (HCC) 08/19/2016   Solitary pulmonary nodule 05/06/2016   Hidradenitis suppurativa 04/27/2016   Chest pain 04/26/2016   Avascular necrosis of bone of hip, left s/p TRH (HCC) 03/19/2016   HIV (human immunodeficiency virus infection) (HCC) 03/18/2016   DVT (deep venous thrombosis) (HCC) 03/18/2016   Chronic kidney disease, stage 3a (HCC) 03/18/2016   Cholelithiasis 03/18/2016   Diverticulosis of colon without hemorrhage 03/18/2016     Problem List Items Addressed This Visit       Genitourinary   Chronic kidney disease, stage 3a (HCC)   Most recent lab work borderline for CKD Stage 3a with eGFR of 59. Check renal function and optimize control of co-morbid conditions.        Other   HIV (human immunodeficiency virus infection) (HCC) - Primary   Mr. Rijo continues to have well-controlled virus with good adherence and tolerance to Cabenuva .  Reviewed previous lab work and discussed plan of care and U equals U.  Covered by Medicaid.  Social determinants of health reviewed with no interventions indicated.  Check blood work.  Continue current dose of Cabenuva  with every 2 month injections. Plan for follow up in 6 months or sooner if needed and with pharmacy provider in between.       Relevant Medications  metroNIDAZOLE  (FLAGYL ) 250 MG tablet   Other Relevant Orders   Comprehensive metabolic panel with GFR   HIV-1 RNA quant-no reflex-bld   T-helper cell (CD4)- ([facility] clinic only)   CBC with Differential/Platelet    Symptomatic bradycardia   Continues to have similar symptoms and was recently in the hospital and has follow up stress test with Cardiology in a couple of weeks. Encouraged to change positions slowly. Advised to seek further care if symptoms worsen.       Weight loss   Mr. Fundora has unintentional weight loss over the past few months of unclear etiology. Have encouraged him to get a scale for home use to monitor weight as it may be as simple as differing scales however cannot exclude underlying weight loss causes and monitoring weight will help to determine. Cancer screenings are up to date. No changes to eating habits. Will continue to monitor at this time. If weight loss continues may need to consider further work up with imaging.       Other Visit Diagnoses       Pharmacologic therapy       Relevant Orders   Lipid panel        I have discontinued Aldine D. Bauserman's pantoprazole , cephALEXin , and oxyCODONE -acetaminophen . I am also having him maintain his olmesartan , Xarelto , triamcinolone  ointment, amLODipine , cabotegravir  & rilpivirine  ER, [Paused] furosemide , Humira  (2 Pen), acetaminophen , rosuvastatin , latanoprost, multivitamin with minerals, and metroNIDAZOLE . We administered cabotegravir  & rilpivirine  ER.   Meds ordered this encounter  Medications   cabotegravir  & rilpivirine  ER (CABENUVA ) 600 & 900 MG/3ML injection 1 kit     Follow-up: Return in about 6 months (around 04/15/2024). or sooner if needed.    Cathlyn July, MSN, FNP-C Nurse Practitioner [facility] for Infectious Disease [facility] Main number: [phone]

## 2023-10-14 NOTE — Assessment & Plan Note (Signed)
 Continues to have similar symptoms and was recently in the hospital and has follow up stress test with Cardiology in a couple of weeks. Encouraged to change positions slowly. Advised to seek further care if symptoms worsen.

## 2023-10-15 ENCOUNTER — Telehealth: Payer: Self-pay

## 2023-10-15 ENCOUNTER — Ambulatory Visit: Payer: Self-pay | Admitting: Family

## 2023-10-15 LAB — T-HELPER CELL (CD4) - (RCID CLINIC ONLY)
CD4 % Helper T Cell: 26 % — ABNORMAL LOW (ref 33–65)
CD4 T Cell Abs: 606 /uL (ref 400–1790)

## 2023-10-15 NOTE — Telephone Encounter (Signed)
 Received voicemail from patient requesting to review lab results.   Jael Kostick, BSN, RN

## 2023-10-16 DIAGNOSIS — R1032 Left lower quadrant pain: Secondary | ICD-10-CM | POA: Diagnosis not present

## 2023-10-16 LAB — LIPID PANEL
Cholesterol: 129 mg/dL (ref <200)
HDL: 71 mg/dL (ref >=40)
LDL Cholesterol (Calc): 44 mg/dL
Non-HDL Cholesterol (Calc): 58 mg/dL (ref <130)
Total CHOL/HDL Ratio: 1.8 (calc) (ref <5.0)
Triglycerides: 60 mg/dL (ref <150)

## 2023-10-16 LAB — COMPREHENSIVE METABOLIC PANEL WITH GFR
AG Ratio: 0.8 (calc) — ABNORMAL LOW (ref 1.0–2.5)
ALT: 10 U/L (ref 9–46)
AST: 14 U/L (ref 10–35)
Albumin: 3.8 g/dL (ref 3.6–5.1)
Alkaline phosphatase (APISO): 77 U/L (ref 35–144)
BUN: 13 mg/dL (ref 7–25)
CO2: 29 mmol/L (ref 20–32)
Calcium: 9.7 mg/dL (ref 8.6–10.3)
Chloride: 100 mmol/L (ref 98–110)
Creat: 1.33 mg/dL (ref 0.70–1.35)
Globulin: 4.7 g/dL — ABNORMAL HIGH (ref 1.9–3.7)
Glucose, Bld: 126 mg/dL — ABNORMAL HIGH (ref 65–99)
Potassium: 3.3 mmol/L — ABNORMAL LOW (ref 3.5–5.3)
Sodium: 140 mmol/L (ref 135–146)
Total Bilirubin: 0.5 mg/dL (ref 0.2–1.2)
Total Protein: 8.5 g/dL — ABNORMAL HIGH (ref 6.1–8.1)
eGFR: 60 mL/min/1.73m2 (ref >=60)

## 2023-10-16 LAB — CBC WITH DIFFERENTIAL/PLATELET
Absolute Lymphocytes: 2367 {cells}/uL (ref 850–3900)
Absolute Monocytes: 685 {cells}/uL (ref 200–950)
Basophils Absolute: 27 {cells}/uL (ref 0–200)
Basophils Relative: 0.3 %
Eosinophils Absolute: 62 {cells}/uL (ref 15–500)
Eosinophils Relative: 0.7 %
HCT: 45.8 % (ref 38.5–50.0)
Hemoglobin: 14.8 g/dL (ref 13.2–17.1)
MCH: 32.6 pg (ref 27.0–33.0)
MCHC: 32.3 g/dL (ref 32.0–36.0)
MCV: 100.9 fL — ABNORMAL HIGH (ref 80.0–100.0)
MPV: 10 fL (ref 7.5–12.5)
Monocytes Relative: 7.7 %
Neutro Abs: 5758 {cells}/uL (ref 1500–7800)
Neutrophils Relative %: 64.7 %
Platelets: 235 Thousand/uL (ref 140–400)
RBC: 4.54 Million/uL (ref 4.20–5.80)
RDW: 11.6 % (ref 11.0–15.0)
Total Lymphocyte: 26.6 %
WBC: 8.9 Thousand/uL (ref 3.8–10.8)

## 2023-10-16 LAB — HIV-1 RNA QUANT-NO REFLEX-BLD
HIV 1 RNA Quant: 30 {copies}/mL — ABNORMAL HIGH
HIV-1 RNA Quant, Log: 1.48 {Log_copies}/mL — ABNORMAL HIGH

## 2023-10-24 ENCOUNTER — Telehealth (HOSPITAL_COMMUNITY): Payer: Self-pay

## 2023-10-24 NOTE — Telephone Encounter (Signed)
 Spoke with the patient, detailed instructions given. S.Icela Glymph CCT

## 2023-10-31 ENCOUNTER — Encounter (HOSPITAL_COMMUNITY): Payer: Self-pay

## 2023-10-31 ENCOUNTER — Emergency Department (HOSPITAL_COMMUNITY)

## 2023-10-31 ENCOUNTER — Encounter: Payer: Self-pay | Admitting: Cardiology

## 2023-10-31 ENCOUNTER — Other Ambulatory Visit: Payer: Self-pay

## 2023-10-31 ENCOUNTER — Telehealth (HOSPITAL_COMMUNITY): Payer: Self-pay | Admitting: *Deleted

## 2023-10-31 ENCOUNTER — Emergency Department (HOSPITAL_COMMUNITY)
Admission: EM | Admit: 2023-10-31 | Discharge: 2023-10-31 | Disposition: A | Discharge status: Home / Self Care | Attending: Emergency Medicine | Admitting: Emergency Medicine

## 2023-10-31 ENCOUNTER — Ambulatory Visit (HOSPITAL_COMMUNITY)
Admission: RE | Admit: 2023-10-31 | Discharge: 2023-10-31 | Disposition: A | Discharge status: Home / Self Care | Source: Ambulatory Visit | Attending: Cardiology | Admitting: Cardiology

## 2023-10-31 DIAGNOSIS — Z7901 Long term (current) use of anticoagulants: Secondary | ICD-10-CM | POA: Diagnosis not present

## 2023-10-31 DIAGNOSIS — I951 Orthostatic hypotension: Secondary | ICD-10-CM | POA: Insufficient documentation

## 2023-10-31 DIAGNOSIS — I2510 Atherosclerotic heart disease of native coronary artery without angina pectoris: Secondary | ICD-10-CM

## 2023-10-31 DIAGNOSIS — Z9189 Other specified personal risk factors, not elsewhere classified: Secondary | ICD-10-CM

## 2023-10-31 LAB — CBC WITH DIFFERENTIAL/PLATELET
Abs Immature Granulocytes: 0.05 K/uL (ref 0.00–0.07)
Basophils Absolute: 0 K/uL (ref 0.0–0.1)
Basophils Relative: 0 %
Eosinophils Absolute: 0 K/uL (ref 0.0–0.5)
Eosinophils Relative: 0 %
HCT: 42.6 % (ref 39.0–52.0)
Hemoglobin: 14 g/dL (ref 13.0–17.0)
Immature Granulocytes: 1 %
Lymphocytes Relative: 23 %
Lymphs Abs: 2.1 K/uL (ref 0.7–4.0)
MCH: 33.4 pg (ref 26.0–34.0)
MCHC: 32.9 g/dL (ref 30.0–36.0)
MCV: 101.7 fL — ABNORMAL HIGH (ref 80.0–100.0)
Monocytes Absolute: 0.8 K/uL (ref 0.1–1.0)
Monocytes Relative: 8 %
Neutro Abs: 6.3 K/uL (ref 1.7–7.7)
Neutrophils Relative %: 68 %
Platelets: 214 K/uL (ref 150–400)
RBC: 4.19 MIL/uL — ABNORMAL LOW (ref 4.22–5.81)
RDW: 12.5 % (ref 11.5–15.5)
WBC: 9.4 K/uL (ref 4.0–10.5)
nRBC: 0 % (ref 0.0–0.2)

## 2023-10-31 LAB — COMPREHENSIVE METABOLIC PANEL WITH GFR
ALT: 11 U/L (ref 0–44)
AST: 23 U/L (ref 15–41)
Albumin: 3 g/dL — ABNORMAL LOW (ref 3.5–5.0)
Alkaline Phosphatase: 55 U/L (ref 38–126)
Anion gap: 12 (ref 5–15)
BUN: 11 mg/dL (ref 8–23)
CO2: 23 mmol/L (ref 22–32)
Calcium: 8.8 mg/dL — ABNORMAL LOW (ref 8.9–10.3)
Chloride: 103 mmol/L (ref 98–111)
Creatinine, Ser: 1.35 mg/dL — ABNORMAL HIGH (ref 0.61–1.24)
GFR, Estimated: 59 mL/min — ABNORMAL LOW (ref >60)
Glucose, Bld: 114 mg/dL — ABNORMAL HIGH (ref 70–99)
Potassium: 3.7 mmol/L (ref 3.5–5.1)
Sodium: 138 mmol/L (ref 135–145)
Total Bilirubin: 1.3 mg/dL — ABNORMAL HIGH (ref 0.0–1.2)
Total Protein: 8.2 g/dL — ABNORMAL HIGH (ref 6.5–8.1)

## 2023-10-31 LAB — BRAIN NATRIURETIC PEPTIDE: B Natriuretic Peptide: 79.6 pg/mL (ref 0.0–100.0)

## 2023-10-31 LAB — MAGNESIUM: Magnesium: 1.8 mg/dL (ref 1.7–2.4)

## 2023-10-31 MED ORDER — LACTATED RINGERS IV BOLUS
1000.0000 mL | Freq: Once | INTRAVENOUS | Status: AC
  Administered 2023-10-31: 1000 mL via INTRAVENOUS

## 2023-10-31 NOTE — ED Notes (Signed)
Provided pt with [place] sandwich and drink.

## 2023-10-31 NOTE — ED Notes (Signed)
 Delay in fluid administration. Awaiting IV team.

## 2023-10-31 NOTE — Progress Notes (Incomplete)
  Pt arrived for ETT ordered by Dr Kate. Pretest BP sitting 156/88 standing 90/62. Took standing bp multiple times with 2 devices. Readings were 83/70, 79/63, 72/58. Called DOD  Dr. Michele who advised pt be sent to ED. Non emergent 911 call placed for transport.

## 2023-10-31 NOTE — Discharge Instructions (Signed)
 Thank you for allowing us  to care for you today.  We believe your symptoms are related to fluid loss in the setting of daily furosemide  (Lasix ) use.  Please stop taking your Lasix  until you follow-up with your primary care doctor or cardiology.  Please return to the emergency department if you experience chest pain, shortness of breath, passout or believe you need emergent medical care.  We hope you feel better soon

## 2023-10-31 NOTE — Telephone Encounter (Signed)
 Pt arrived for ETT ordered by Dr Kate. Pretest BP sitting 156/88 standing 90/62. Took standing bp multiple times with 2 devices. Readings were 83/70, 79/63, 72/58. Called DOD  Dr. Michele who advised pt be sent to ED. Non emergent 911 call placed for transport.

## 2023-10-31 NOTE — Progress Notes (Addendum)
 Documentation: DOD  Late Entry.   Call received at approximately 8:30am.  Received a call from stress lab by Ms. Nena Finder regarding the patient's overall health prior to GXT.  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  However prior to his GXT patient was noted to have a resting blood pressure of 156/88 and with standing his blood pressure will be 90/62.  He was symptomatic.  I was also told that he has been having instability with gait for the last months and also has had falls.  I have not seen him in the office and do not know his baseline. It was not clear if his symptoms are predominantly orthostatic as he had already taken both Benicar  and Norvasc  earlier this morning or another explanation for the symptoms that he is experiencing.  Given the instability in the gait and falls recommended nonurgent transfer to ED for further evaluation and management.  I also updated Dr. Bari, ED provider who saw him in the ER today.  Once he is back to baseline and able to ambulate stress test  could be reconsidered as its appropriate in the clinical setting.  Will defer final decision to ordering physician.  Will CC Nena Finder and Dr. Kate to further coordinate care.  Guida Asman [place], DO, [facility] 10/31/23

## 2023-10-31 NOTE — ED Notes (Addendum)
 Orthostatic v/s Lying       184/82 bp    50HR  Sitting     151/81 bp    55HR Standing 111/84 bp    86 HR

## 2023-10-31 NOTE — ED Provider Notes (Signed)
 [facility] Provider Note   CSN: [id number] Arrival date & time: 10/31/23  9079     Patient presents with: orthostatic hypotension   [facility] is a 63 y.o. male past medical history significant for HIV on ART, DVT on Xarelto  who was recently referred to cardiology for stress test in the setting of chest pain and shortness of breath with recent discharge on 09/13/2023 who presents emergency department for hypotension.  Patient was at cardiologist to perform stress test however was unable to have this performed as orthostatic vital signs were positive for hypotension upon standing.  Patient was transferred to [facility] ED via EMS for further evaluation.  On evaluation patient denies current chest pain, shortness of breath, lightheadedness or syncope.  Patient does endorse 1 month history of intermittent chest pain and exertional shortness of breath without orthopnea.   =        Prior to Admission medications   Medication Sig Start Date End Date Taking? Authorizing Provider  acetaminophen  (TYLENOL ) 500 MG tablet Take 1,000 mg by mouth daily as needed for moderate pain (pain score 4-6) or headache.    [provider]  adalimumab  (HUMIRA , 2 PEN,) 40 MG/0.4ML pen Inject 40 mg into the skin every Thursday.    [provider]  amLODipine  (NORVASC ) 10 MG tablet Take 1 tablet (10 mg total) by mouth daily. 10/21/22 10/14/23  Alvan Ronal BRAVO, MD  cabotegravir  & rilpivirine  ER (CABENUVA ) 600 & 900 MG/3ML injection Inject 1 kit into the muscle every 2 (two) months. 05/29/23 05/28/24  Waddell Alan PARAS, RPH-CPP  furosemide  (LASIX ) 40 MG tablet Take 40 mg by mouth daily.    [provider]  latanoprost (XALATAN) 0.005 % ophthalmic solution Place 1 drop into both eyes at bedtime. 08/13/23   [provider]  metroNIDAZOLE  (FLAGYL ) 250 MG tablet Take 250 mg by mouth 3 (three) times daily.    [provider]  Multiple Vitamin  (MULTIVITAMIN WITH MINERALS) TABS tablet Take 1 tablet by mouth daily.    [provider]  olmesartan  (BENICAR ) 20 MG tablet Take 1 tablet by mouth once daily 05/11/21   Trudy Mliss Dragon, MD  rosuvastatin  (CRESTOR ) 10 MG tablet TAKE 1 TABLET BY MOUTH DAILY 09/02/23   Calone, Gregory D, FNP  triamcinolone  ointment (KENALOG ) 0.1 % Apply 1 Application topically 2 (two) times daily.    [provider]  XARELTO  20 MG TABS tablet Take 20 mg by mouth daily. 11/30/21   [provider]    Allergies: Sulfa antibiotics and Firvanq  [vancomycin ]    Review of Systems  Updated Vital Signs BP (!) 139/109   Pulse 63   Temp 97.6 F (36.4 C) (Oral)   Resp 20   Ht 6' (1.829 m)   Wt 129.3 kg   SpO2 100%   BMI 38.65 kg/m   Physical Exam Vitals and nursing note reviewed.  Constitutional:      General: He is not in acute distress. HENT:     Head: Normocephalic and atraumatic.     Jaw: There is normal jaw occlusion.  Eyes:     Pupils: Pupils are equal, round, and reactive to light.  Neck:     Vascular: No JVD.     Trachea: Trachea normal.  Cardiovascular:     Rate and Rhythm: Regular rhythm. Bradycardia present.     Heart sounds: Normal heart sounds. No murmur heard. Pulmonary:     Effort: No tachypnea.  Breath sounds: Normal breath sounds. No decreased breath sounds, wheezing or rales.  Abdominal:     General: There is no distension.     Palpations: Abdomen is soft.     Tenderness: There is no abdominal tenderness.  Musculoskeletal:     Right lower leg: No edema.     Left lower leg: No edema.     Comments: Spontaneous movement of bilateral upper and lower extremities  Psychiatric:        Behavior: Behavior is cooperative.     (all labs ordered are listed, but only abnormal results are displayed) Labs Reviewed  CBC WITH DIFFERENTIAL/PLATELET - Abnormal; Notable for the following components:      Result Value   RBC 4.19 (*)    MCV 101.7 (*)    All other  components within normal limits  COMPREHENSIVE METABOLIC PANEL WITH GFR - Abnormal; Notable for the following components:   Glucose, Bld 114 (*)    Creatinine, Ser 1.35 (*)    Calcium  8.8 (*)    Total Protein 8.2 (*)    Albumin 3.0 (*)    Total Bilirubin 1.3 (*)    GFR, Estimated 59 (*)    All other components within normal limits  MAGNESIUM  BRAIN NATRIURETIC PEPTIDE    EKG: EKG Interpretation Date/Time:  Friday October 31 2023 09:43:42 EDT Ventricular Rate:  50 PR Interval:  47 QRS Duration:  168 QT Interval:  467 QTC Calculation: 426 R Axis:   44  Text Interpretation: Sinus rhythm Ventricular trigeminy Short PR interval Nonspecific intraventricular conduction delay Confirmed by Bari Flank [phone]) on 10/31/2023 11:04:42 AM  Radiology: [facility] Chest Portable 1 View Result Date: 10/31/2023 CLINICAL DATA:  SOB EXAM: PORTABLE CHEST - 1 VIEW COMPARISON:  September 12, 2023 FINDINGS: No focal airspace consolidation, pleural effusion, or pneumothorax. Moderate cardiomegaly. Tortuous aorta with aortic atherosclerosis. No acute fracture or destructive lesions. Multilevel thoracic osteophytosis. IMPRESSION: No acute cardiopulmonary abnormality. Electronically Signed   By: Rogelia Myers M.D.   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  PVC present.  No evidence of acute ischemic change, heart block or dysrhythmia [AG]    Clinical Course  User Index [AG] Nada Chroman, DO                                 Medical Decision Making Amount and/or Complexity of Data Reviewed Labs: ordered. Radiology: ordered.   On initial valuation patient is bradycardic however normotensive, afebrile and not in acute distress.  Upon chart review, patient has known history of bradycardia without acute changes on EKG findings.  Based upon patient's history and physical examination findings differential diagnoses include hypovolemia, electrolyte abnormalities, medication side effect, arrhythmia.  Upon chart review, patient was advised to take Lasix  as needed however patient states that he is taking this  daily.  He states that he experiences polyuria at home.  At this time believe patient's presentation is secondary to hypovolemia therefore will give fluid bolus.  BNP obtained without evidence of elevation.  EKG without evidence of arrhythmia or acute ischemic change.  Laboratory studies without significant electrolyte abnormality, anemia or leukocytosis.  Although patient does have shortness of breath on exertion, do not believe patient's presentation is secondary to pulmonary embolism.  Patient does have a history of DVT however states compliancy with Xarelto  with no missed doses, is not hypoxic, tachycardic and does not have clinical signs of DVT with a low Wells score of 1.5 and a recent D-dimer that is negative therefore do not believe further evaluation is necessary at this time.  Orthostatic vitals repeated, patient remains orthostatic however with improvement prior to initial evaluation.  At this time, patient was educated regarding Lasix  discontinuation until he is able to follow-up in outpatient setting with primary care or cardiology.  Believe patient is stable to be discharged home with strict return precautions and outpatient follow-up     Final diagnoses:  Orthostatic hypotension    ED Discharge Orders     None      Lavanda Bolster DO   Emergency Medicine PGY2    Bolster Lavanda, DO 10/31/23 1553    Horton, Roxie HERO, DO 11/01/23 [phone]

## 2023-10-31 NOTE — ED Triage Notes (Signed)
 Pt bib [facility] from cardiovascular. Pt was at cardiovascular to perform a stress test. When patient got up pt felt lightheaded and dizzy.  Pt was seen a week ago here and was referred to do a stress test.  Pt is on blood thinner for DVT.   Ems orthostatic upon arrival to ED  170/80 sitting 101/77 standing 54 HR  136cbg  18 RR 98%

## 2023-11-03 NOTE — Unmapped (Signed)
 [facility] Refill Coordination Note    Specialty Medication(s) to be Shipped:   Inflammatory Disorders: Humira     Other medication(s) to be shipped: No additional medications requested for fill at this time     Dustin Prince, DOB: 1960-06-09  Phone: There are no phone numbers on file.      All above HIPAA information was verified with patient.     Was a Nurse, learning disability used for this call? No    Completed refill call assessment today to schedule patient's medication shipment from the [facility]  [phone]).  All relevant notes have been reviewed.     Specialty medication(s) and dose(s) confirmed: Regimen is correct and unchanged.   Changes to medications: Dustin Prince reports no changes at this time.  Changes to insurance: No  New side effects reported not previously addressed with a pharmacist or physician: None reported  Questions for the pharmacist: No    Confirmed patient received a Conservation officer, historic buildings and a Surveyor, mining with first shipment. The patient will receive a drug information handout for each medication shipped and additional FDA Medication Guides as required.       DISEASE/MEDICATION-SPECIFIC INFORMATION        For patients on injectable medications: Patient currently has 0 doses left.  Next injection is scheduled for 8/7.    SPECIALTY MEDICATION ADHERENCE     Medication Adherence    Patient reported X missed doses in the last month: 0  Specialty Medication: HUMIRA (CF) PEN 40 mg/0.4 mL  Patient is on additional specialty medications: No  Support network for adherence: family member              Were doses missed due to medication being on hold? No    Humira  40/0.4 mg/ml: 0 doses of medicine on hand        REFERRAL TO PHARMACIST     Referral to the pharmacist: Not needed      [facility]     Shipping address confirmed in Epic.     Cost and Payment: Patient has a $0 copay, payment information is not required.    Delivery Scheduled: Yes, Expected medication delivery date: 11/05/23.     Medication will be delivered via UPS to the prescription address in Epic WAM.    [name]   [facility]  Specialty Technician

## 2023-11-04 DIAGNOSIS — L732 Hidradenitis suppurativa: Principal | ICD-10-CM

## 2023-11-04 MED FILL — HUMIRA PEN CITRATE FREE 40 MG/0.4 ML: SUBCUTANEOUS | 28 days supply | Qty: 4 | Fill #2

## 2023-11-05 DIAGNOSIS — R42 Dizziness and giddiness: Secondary | ICD-10-CM | POA: Diagnosis not present

## 2023-11-06 ENCOUNTER — Ambulatory Visit: Attending: Cardiology | Admitting: Cardiology

## 2023-11-06 ENCOUNTER — Ambulatory Visit

## 2023-11-06 VITALS — Ht 72.0 in | Wt 291.8 lb

## 2023-11-06 DIAGNOSIS — I2510 Atherosclerotic heart disease of native coronary artery without angina pectoris: Secondary | ICD-10-CM | POA: Insufficient documentation

## 2023-11-06 DIAGNOSIS — I493 Ventricular premature depolarization: Secondary | ICD-10-CM | POA: Diagnosis not present

## 2023-11-06 DIAGNOSIS — I951 Orthostatic hypotension: Secondary | ICD-10-CM | POA: Insufficient documentation

## 2023-11-06 DIAGNOSIS — E785 Hyperlipidemia, unspecified: Secondary | ICD-10-CM | POA: Diagnosis not present

## 2023-11-06 NOTE — Progress Notes (Signed)
 Cardio.orthostatic logy Office Note:    Date:  11/06/2023   ID:  Allen Espinoza, DOB May 22, 1960, MRN [id number]  PCP:  [name], MD  Cardiologist:  None  Electrophysiologist:  None   Referring MD: [name], MD   Chief Complaint  Patient presents with   Orthostatic hypotension    History of Present Illness:    Allen Espinoza is a 63 y.o. male with a hx of CKD stage III, DVT on Xarelto , HIV, asthma who presents for follow-up.  He was seen as a consult by Dr. Alveta 08/2023.  He had presented to the ED with chest pain and shortness of breath.  EKG without ischemic changes troponin negative.  Outpatient coronary CTA was recommended.  Echocardiogram 09/12/2023 showed normal biventricular function, no significant valvular disease.  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  Stress test was recommended to evaluate for ischemia but prior to treadmill test was noted to be orthostatic and sent to ED.  He had been on as needed Lasix  but was taking every day.  He received IV fluids in the ED and was told to just take Lasix  as needed.  He reports he stopped taking Lasix .  Also stopped his amlodipine  and olmesartan .  He reports he still gets lightheaded with standing.  Did have syncopal episode prior to stress test.  Reports occasional chest pain, occurs at rest.  Describes left-sided chest pressure that lasts for minutes.  Has not noted chest pain with exertion, though is not been exercising much since his hip replacement.  He does report shortness of breath with exertion.    Past Medical History:  Diagnosis Date   Abscess    Between legs   Arthritis    all over   Asthma    Avascular necrosis of femoral head, left (HCC)    Blood transfusion without reported diagnosis    Cholelithiasis    CKD (chronic kidney disease) stage 3, GFR 30-59 ml/min  (HCC)    sees kidney Dr.   Merceda disorder (HCC)    left DVT   Diverticulosis    DVT (deep venous thrombosis) (HCC)    legs   Dyspnea    when walking   Dysrhythmia    remembers mother taking about having an irregular rhythm years when he was a child    GERD (gastroesophageal reflux disease)    HIV (human immunodeficiency virus infection) (HCC)    Hypertension    Morbid obesity (HCC)     Past Surgical History:  Procedure Laterality Date   COLONOSCOPY  2019   DENTAL SURGERY     had teeth pulled   HYDRADENITIS EXCISION Left 10/14/2016   Procedure: WIDE EXCISION HIDRADENITIS LEFT AXILLA;  Surgeon: Vernetta Berg, MD;  Location: [facility] OR;  Service: General;  Laterality: Left;   JOINT REPLACEMENT     Left hip Dr. Vernetta 11/29/16   TOTAL HIP ARTHROPLASTY Left 11/29/2016   Procedure: LEFT TOTAL HIP ARTHROPLASTY ANTERIOR APPROACH;  Surgeon: Vernetta Lonni GRADE, MD;  Location: [facility];  Service: Orthopedics;  Laterality: Left;    Current Medications: Current Meds  Medication Sig   acetaminophen  (TYLENOL ) 500 MG tablet Take 1,000 mg by mouth daily as needed for moderate pain (pain score 4-6) or headache.   adalimumab  (HUMIRA , 2 PEN,) 40 MG/0.4ML pen Inject 40 mg into the skin every Thursday.  cabotegravir  & rilpivirine  ER (CABENUVA ) 600 & 900 MG/3ML injection Inject 1 kit into the muscle every 2 (two) months.   latanoprost (XALATAN) 0.005 % ophthalmic solution Place 1 drop into both eyes at bedtime.   metroNIDAZOLE  (FLAGYL ) 250 MG tablet Take 250 mg by mouth 3 (three) times daily.   Multiple Vitamin (MULTIVITAMIN WITH MINERALS) TABS tablet Take 1 tablet by mouth daily.   rosuvastatin  (CRESTOR ) 10 MG tablet TAKE 1 TABLET BY MOUTH DAILY   triamcinolone  ointment (KENALOG ) 0.1 % Apply 1 Application topically 2 (two) times daily.   XARELTO  20 MG TABS tablet Take 20 mg by mouth daily.     Allergies:   Sulfa antibiotics and Firvanq  [vancomycin ]   Social History   Socioeconomic  History   Marital status: Single    Spouse name: Not on file   Number of children: 2   Years of education: Not on file   Highest education level: Not on file  Occupational History   Occupation: Disability  Tobacco Use   Smoking status: Never   Smokeless tobacco: Never  Vaping Use   Vaping status: Never Used  Substance and Sexual Activity   Alcohol use: No    Comment: prior   Drug use: No    Comment: prior cocaine use, last 2005   Sexual activity: Not Currently    Partners: Female    Birth control/protection: Condom  Other Topics Concern   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, mailroom, warehouse work   Lives w/ fiancee   No EtOH, tobacco, drugs   Social Drivers of Corporate investment banker Strain: Medium Risk (02/18/2022)   Overall Financial Resource Strain (CARDIA)    Difficulty of Paying Living Expenses: Somewhat hard  Food Insecurity: No Food Insecurity (09/12/2023)   Hunger Vital Sign    Worried About Running Out of Food in the Last Year: Never true    Ran Out of Food in the Last Year: Never true  Transportation Needs: No Transportation Needs (09/12/2023)   PRAPARE - Administrator, Civil Service (Medical): No    Lack of Transportation (Non-Medical): No  Physical Activity: Insufficiently Active (12/10/2017)   Exercise Vital Sign    Days of Exercise per Week: 2 days    Minutes of Exercise per Session: 10 min  Stress: No Stress Concern Present (12/10/2017)   [employer] of Occupational Health - Occupational Stress Questionnaire    Feeling of Stress : Only a little  Social Connections: Somewhat Isolated (12/10/2017)   Social Connection and Isolation Panel    Frequency of Communication with Friends and Family: Three times a week    Frequency of Social Gatherings with Friends and Family: More than three times a week    Attends Religious Services: Never    Database administrator or Organizations: No    Attends Probation officer: Never    Marital Status: Living with partner     Family History: The patient's family history includes Bone cancer in his maternal aunt; Breast cancer in his maternal aunt; Heart attack (age of onset: 71) in his father and mother; Prostate cancer in his brother. There is no history of Colon cancer, Esophageal cancer, Stomach cancer, Rectal cancer, Migraines, or Headache.  ROS:   Please see the history of present illness.     All other systems reviewed and are negative.  EKGs/Labs/Other Studies Reviewed:    The following studies were reviewed today:  EKG:   11/06/2023: Sinus bradycardia, frequent PVCs, rate 50  Recent Labs: 06/28/2023: TSH 1.110 10/31/2023: ALT 11; B Natriuretic Peptide 79.6; BUN 11; Creatinine, Ser 1.35; Hemoglobin 14.0; Magnesium 1.8; Platelets 214; Potassium 3.7; Sodium 138  Recent Lipid Panel    Component Value Date/Time   CHOL 129 10/14/2023 0925   CHOL 150 03/09/2021 1017   TRIG 60 10/14/2023 0925   HDL 71 10/14/2023 0925   HDL 65 03/09/2021 1017   CHOLHDL 1.8 10/14/2023 0925   VLDL 12 09/12/2023 1836   LDLCALC 44 10/14/2023 0925    Physical Exam:    VS:  Ht 6' (1.829 m)   Wt 291 lb 12.8 oz (132.4 kg)   SpO2 96%   BMI 39.58 kg/m     Wt Readings from Last 3 Encounters:  11/06/23 291 lb 12.8 oz (132.4 kg)  10/31/23 285 lb (129.3 kg)  10/14/23 285 lb (129.3 kg)     GEN:  Well nourished, well developed in no acute distress HEENT: Normal NECK: No JVD; No carotid bruits CARDIAC: RRR, no murmurs, rubs, gallops RESPIRATORY:  Clear to auscultation without rales, wheezing or rhonchi  ABDOMEN: Soft, non-tender, non-distended MUSCULOSKELETAL:  No edema; No deformity  SKIN: Warm and dry NEUROLOGIC:  Alert and oriented x 3 PSYCHIATRIC:  Normal affect   ASSESSMENT:    1. Orthostatic hypotension   2. PVC's (premature ventricular contractions)   3. Coronary artery disease involving native coronary artery of native heart  without angina pectoris   4. Hyperlipidemia, unspecified hyperlipidemia type    PLAN:    Orthostatic hypotension: Noted on ED visit 8/1.  His Lasix  was discontinued.  Also stopped his olmesartan  and amlodipine .  Orthostatics in clinic today continue to show significant drop in BP with standing.  Discussed staying well-hydrated and use of compression stockings.  Would continue to hold antihypertensives and Lasix .  Check BMET, magnesium, TSH  Frequent PVCs: Noted on EKG in clinic today.  Check Zio patch x 7 days  Chest plain/DOE:  Echocardiogram 09/12/2023 showed normal biventricular function, no significant valvular disease.  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 - Recommend stress test to rule out ischemia from anomalous RCA with interarterial course.  This was planned for 8/1, but was canceled due to orthostatic hypotension.  Will plan stress test once orthostasis resolved  Hypertension: Previously on olmesartan  20 mg daily and amlodipine  10 mg daily, but currently on hold given orthostatic hypotension as above  DVT: On Xarelto   Hyperlipidemia: On rosuvastatin  10 mg daily.  LDL 44 on 10/14/2023  HIV: On HAART  RTC in 1 month  Medication Adjustments/Labs and Tests Ordered: Current medicines are reviewed at length with the patient today.  Concerns regarding medicines are outlined above.  Orders Placed This Encounter  Procedures   Basic metabolic panel with GFR   Magnesium   TSH   LONG TERM MONITOR (3-14 DAYS)   EKG 12-Lead   No orders of the defined types were placed in this encounter.   Patient Instructions  Medication Instructions:  Stop Amlodipine , Olmesartan  & Lasix  as directed   *If you need a refill on your cardiac medications before your next appointment, please call your pharmacy*  Lab Work: BMET, Magnesium, TSH at your  convenience  Testing/Procedures: GEOFFRY HEWS- Long Term Monitor Instructions  Your physician has requested you wear a ZIO patch monitor for  7 days.  This is a single patch monitor. [employer] supplies one patch monitor per enrollment. Additional stickers are not available. Please do not apply patch if you will be having a Nuclear Stress Test,  Echocardiogram, Cardiac CT, MRI, or Chest Xray during the period you would be wearing the  monitor. The patch cannot be worn during these tests. You cannot remove and re-apply the  ZIO XT patch monitor.  Your ZIO patch monitor will be mailed 3 day USPS to your address on file. It may take 3-5 days  to receive your monitor after you have been enrolled.  Once you have received your monitor, please review the enclosed instructions. Your monitor  has already been registered assigning a specific monitor serial # to you.  Billing and Patient Assistance Program Information  We have supplied [employer] with any of your insurance information on file for billing purposes. [employer] offers a sliding scale Patient Assistance Program for patients that do not have  insurance, or whose insurance does not completely cover the cost of the ZIO monitor.  You must apply for the Patient Assistance Program to qualify for this discounted rate.  To apply, please call [employer] at [phone], select option 4, select option 2, ask to apply for  Patient Assistance Program. Meredeth will ask your household income, and how many people  are in your household. They will quote your out-of-pocket cost based on that information.  [employer] will also be able to set up a 68-month, interest-free payment plan if needed.  Applying the monitor   Shave hair from upper left chest.  Hold abrader disc by orange tab. Rub abrader in 40 strokes over the upper left chest as  indicated in your monitor instructions.  Clean area with 4 enclosed alcohol pads. Let dry.  Apply patch as indicated in monitor  instructions. Patch will be placed under collarbone on left  side of chest with arrow pointing upward.  Rub patch adhesive wings for 2 minutes. Remove white label marked 1. Remove the white  label marked 2. Rub patch adhesive wings for 2 additional minutes.  While looking in a mirror, press and release button in center of patch. A small green light will  flash 3-4 times. This will be your only indicator that the monitor has been turned on.  Do not shower for the first 24 hours. You may shower after the first 24 hours.  Press the button if you feel a symptom. You will hear a small click. Record Date, Time and  Symptom in the Patient Logbook.  When you are ready to remove the patch, follow instructions on the last 2 pages of Patient  Logbook. Stick patch monitor onto the last page of Patient Logbook.  Place Patient Logbook in the blue and white box. Use locking tab on box and tape box closed  securely. The blue and white box has prepaid postage on it. Please place it in the mailbox as  soon as possible. Your physician should have your test results approximately 7 days after the  monitor has been mailed back to [facility].  Call [facility] Customer Care at [phone] if you have questions regarding  your ZIO XT patch monitor. Call them immediately if you see an orange light blinking on your  monitor.  If your monitor falls off in less than 4 days, contact our Monitor department at [phone].  If your monitor becomes loose or falls off after 4 days call [employer] at [phone] for  suggestions on securing  your monitor   Follow-Up: At [facility], you and your health needs are our priority.  As part of our continuing mission to provide you with exceptional heart care, our providers are all part of one team.  This team includes your primary Cardiologist (physician) and Advanced Practice Providers or APPs (Physician Assistants and Nurse Practitioners) who all work  together to provide you with the care you need, when you need it.  Your next appointment:    Keep September 2nd appointment   Provider:   Dr. Kate   We recommend signing up for the patient portal called MyChart.  Sign up information is provided on this After Visit Summary.  MyChart is used to connect with patients for Virtual Visits (Telemedicine).  Patients are able to view lab/test results, encounter notes, upcoming appointments, etc.  Non-urgent messages can be sent to your provider as well.   To learn more about what you can do with MyChart, go to [email or web address].   Other Instructions Recommends wearing compression stockings while awake        Signed, Lonni LITTIE Kate, MD  11/06/2023 5:10 PM    [facility]

## 2023-11-06 NOTE — Progress Notes (Unsigned)
 Enrolled patient for a 7 day Zio XT monitor to be mailed to patients home.

## 2023-11-06 NOTE — Patient Instructions (Signed)
 Medication Instructions:  Stop Amlodipine , Olmesartan  & Lasix  as directed   *If you need a refill on your cardiac medications before your next appointment, please call your pharmacy*  Lab Work: BMET, Magnesium, TSH at your convenience  Testing/Procedures: GEOFFRY HEWS- Long Term Monitor Instructions  Your physician has requested you wear a ZIO patch monitor for 7 days.  This is a single patch monitor. [employer] supplies one patch monitor per enrollment. Additional stickers are not available. Please do not apply patch if you will be having a Nuclear Stress Test,  Echocardiogram, Cardiac CT, MRI, or Chest Xray during the period you would be wearing the  monitor. The patch cannot be worn during these tests. You cannot remove and re-apply the  ZIO XT patch monitor.  Your ZIO patch monitor will be mailed 3 day USPS to your address on file. It may take 3-5 days  to receive your monitor after you have been enrolled.  Once you have received your monitor, please review the enclosed instructions. Your monitor  has already been registered assigning a specific monitor serial # to you.  Billing and Patient Assistance Program Information  We have supplied [employer] with any of your insurance information on file for billing purposes. [employer] offers a sliding scale Patient Assistance Program for patients that do not have  insurance, or whose insurance does not completely cover the cost of the ZIO monitor.  You must apply for the Patient Assistance Program to qualify for this discounted rate.  To apply, please call [employer] at [phone], select option 4, select option 2, ask to apply for  Patient Assistance Program. Meredeth will ask your household income, and how many people  are in your household. They will quote your out-of-pocket cost based on that information.  [employer] will also be able to set up a 68-month, interest-free payment plan if needed.  Applying the monitor   Shave hair from upper left chest.   Hold abrader disc by orange tab. Rub abrader in 40 strokes over the upper left chest as  indicated in your monitor instructions.  Clean area with 4 enclosed alcohol pads. Let dry.  Apply patch as indicated in monitor instructions. Patch will be placed under collarbone on left  side of chest with arrow pointing upward.  Rub patch adhesive wings for 2 minutes. Remove white label marked 1. Remove the white  label marked 2. Rub patch adhesive wings for 2 additional minutes.  While looking in a mirror, press and release button in center of patch. A small green light will  flash 3-4 times. This will be your only indicator that the monitor has been turned on.  Do not shower for the first 24 hours. You may shower after the first 24 hours.  Press the button if you feel a symptom. You will hear a small click. Record Date, Time and  Symptom in the Patient Logbook.  When you are ready to remove the patch, follow instructions on the last 2 pages of Patient  Logbook. Stick patch monitor onto the last page of Patient Logbook.  Place Patient Logbook in the blue and white box. Use locking tab on box and tape box closed  securely. The blue and white box has prepaid postage on it. Please place it in the mailbox as  soon as possible. Your physician should have your test results approximately 7 days after the  monitor has been mailed back to [facility].  Call [facility] Customer Care at [phone] if you have questions regarding  your ZIO XT patch monitor. Call them immediately if you see an orange light blinking on your  monitor.  If your monitor falls off in less than 4 days, contact our Monitor department at [phone].  If your monitor becomes loose or falls off after 4 days call [employer] at [phone] for  suggestions on securing your monitor   Follow-Up: At [facility], you and your health needs are our priority.  As part of our continuing mission to provide you with  exceptional heart care, our providers are all part of one team.  This team includes your primary Cardiologist (physician) and Advanced Practice Providers or APPs (Physician Assistants and Nurse Practitioners) who all work together to provide you with the care you need, when you need it.  Your next appointment:    Keep September 2nd appointment   Provider:   Dr. Kate   We recommend signing up for the patient portal called MyChart.  Sign up information is provided on this After Visit Summary.  MyChart is used to connect with patients for Virtual Visits (Telemedicine).  Patients are able to view lab/test results, encounter notes, upcoming appointments, etc.  Non-urgent messages can be sent to your provider as well.   To learn more about what you can do with MyChart, go to [email or web address].   Other Instructions Recommends wearing compression stockings while awake

## 2023-11-07 ENCOUNTER — Ambulatory Visit: Admitting: Cardiology

## 2023-11-07 DIAGNOSIS — R42 Dizziness and giddiness: Secondary | ICD-10-CM | POA: Diagnosis not present

## 2023-11-10 DIAGNOSIS — I10 Essential (primary) hypertension: Secondary | ICD-10-CM | POA: Diagnosis not present

## 2023-11-11 DIAGNOSIS — R42 Dizziness and giddiness: Secondary | ICD-10-CM | POA: Diagnosis not present

## 2023-11-12 ENCOUNTER — Ambulatory Visit: Payer: Self-pay | Admitting: Cardiology

## 2023-11-12 DIAGNOSIS — R42 Dizziness and giddiness: Secondary | ICD-10-CM | POA: Diagnosis not present

## 2023-11-12 LAB — BASIC METABOLIC PANEL WITH GFR
BUN/Creatinine Ratio: 6 — ABNORMAL LOW (ref 10–24)
BUN: 7 mg/dL — ABNORMAL LOW (ref 8–27)
CO2: 19 mmol/L — ABNORMAL LOW (ref 20–29)
Calcium: 9.7 mg/dL (ref 8.6–10.2)
Chloride: 103 mmol/L (ref 96–106)
Creatinine, Ser: 1.14 mg/dL (ref 0.76–1.27)
Glucose: 89 mg/dL (ref 70–99)
Potassium: 4.4 mmol/L (ref 3.5–5.2)
Sodium: 142 mmol/L (ref 134–144)
eGFR: 72 mL/min/1.73 (ref >59)

## 2023-11-12 LAB — TSH: TSH: 0.79 u[IU]/mL (ref 0.450–4.500)

## 2023-11-12 LAB — MAGNESIUM: Magnesium: 2 mg/dL (ref 1.6–2.3)

## 2023-11-13 ENCOUNTER — Other Ambulatory Visit: Payer: Self-pay | Admitting: Family

## 2023-11-13 DIAGNOSIS — Z9189 Other specified personal risk factors, not elsewhere classified: Secondary | ICD-10-CM

## 2023-11-13 DIAGNOSIS — Z21 Asymptomatic human immunodeficiency virus [HIV] infection status: Secondary | ICD-10-CM

## 2023-11-17 DIAGNOSIS — L732 Hidradenitis suppurativa: Secondary | ICD-10-CM | POA: Diagnosis not present

## 2023-11-17 DIAGNOSIS — Z79899 Other long term (current) drug therapy: Secondary | ICD-10-CM | POA: Diagnosis not present

## 2023-11-17 DIAGNOSIS — Z21 Asymptomatic human immunodeficiency virus [HIV] infection status: Secondary | ICD-10-CM | POA: Diagnosis not present

## 2023-11-17 MED ORDER — BIMZELX AUTOINJECTOR 320 MG/2 ML SUBCUTANEOUS AUTO-INJECTOR
SUBCUTANEOUS | 4 refills | 0.00000 days | Status: CP
Start: 2023-11-17 — End: ?
  Filled 2023-11-19: qty 12, 84d supply, fill #0

## 2023-11-17 NOTE — Unmapped (Signed)
 Self-reported severity (0-5): 4  VAS pain today: 6  VAS average pain for the last month: 6  Requiring pain medication? No.  If so, what type/frequency? N/a  How often in pain?  few times a day  Level of odor (0-5): 4  Level of itching (0-5): 2  Dressing changes needed for drainage:Several times a day  How much drainage: some drainage  Flare in the last month (Y/N)? Yes.  How long ago was the last flare? in last 6 months  Developing new lesions? less than monthly  Number of inflammatory lesions montly: 3-5  DLQI: n/a   Current treatment: Humira     How helpful is the current treatment in managing the following aspects of your disease?  Not at all helpful Somewhat helpful Very helpful   Pain      Decreasing length of flares      Decreasing new lesions      Drainage      Decreasing frequency of flares      Decreasing severity of flares      Odor

## 2023-11-17 NOTE — Unmapped (Signed)
 Dermatology Note    Assessment and Plan:      Severe Hidradenitis suppurativa, [place] Stage 3, scrotal, inguinal and perirenal; requiring chronic management, stable, well-controlled on Humira , in patient w/ hx of well controlled HIV    - We discussed the typical natural history, pathogenesis, treatment options, and expected course as well as the relapsing and sometimes recalcitrant nature of the disease.    - Reviewed role of biologic therapy including remicade vs cosentyx vs bimzelx   - Reviewed role of localized surgical procedures such as ILK, punch excisions, unroofing procedures and compared to surgical interventions by plastic surgery in the operating room. Discussed considering deroofing procedure for groin and procedure for scrotal area with Reconstructive Urology -referral to Dr. Levonia today.  - Based on discussion above and r/b/a reviewed, mutual decision to proceed with the following:  - Continue Humira  40mg   weekly until Bimzelx  starts  -Start bimekizumab  320mg  every 2 weeks for 16 weeks then 320mg  every 4 weeks for maintenance.  Side effects including risk of thrush reviewed  - Continue metronidazole  250 mg TID. Counseled regarding risk of peripheral neuropathy with long-term use.  - Continue triamcinolone  0.1 % ointment; Apply twice daily to affected areas as needed for dermatitis in the gluteal cleft. We reviewed proper use and side effects of topical steroids including striae and cutaneous atrophy.   - Will send referral to general surgery to discuss surgery for the buttocks (~10x7cm).  Previously saw Dr. Levonia to discuss surgery on scrotum and deferred in 03/2023.     High Risk Medication Use (humira )  - Quant gold negative negative in 03/2023    The patient was advised to call for an appointment should any new, changing, or symptomatic lesions develop.     RTC: 4-5 months_______________________      Chief Complaint     Chief Complaint   Patient presents with    Hidradenitis suppurativa     Pt coming in due to flares on the groin and buttocks        HPI     Dustin Prince is a 63 y.o. male who presents as a returning patient (last seen by Dr. Paulita on 03/03/2023) to Dermatology for follow up of hidradenitis suppurativa. At last visit, patient was to continue humira  40 mg weekly, continue metronidazole  250 mg three times daily, and start triamcinolone  0.1% ointment for his hidradenitis suppurativa.  Feels like things are flaring more on the buttocks lately.  Scrotum continues to have trouble, but overall more stable. He's gotten a few new lesions in the groin as well and has had to have 2 I&D procedures on the buttocks since last visit.  Saw Dr. Levonia to discuss surgery in 03/2023 and deferred at that time, but would consider it on his buttocks now.    Pertinent Past Medical History     Social History:  Current or former smoker? never  Amount smoking: n/a  How many years: n/a  ED visits in the last 5 years? 6-10  Difficulty affording medications? most of the time  Marital Status: in relationship  Living with some one? Yes.     Prior treatments:  Topical: topical ABX  Systemic: clidnamycin; doxy (not very helpful),   Past surgical procedures: large WLE left axilla c/b dehisence   Past laser procedures: none    Hx of well controlled HIV     Past Medical History, Family History, Social History, Medication List, Allergies, and Problem List were reviewed in the rooming section of Epic.  ROS: Other than symptoms mentioned in the HPI, no fevers, chills, or other skin complaints    Physical Examination     Gen: Well-appearing patient, appropriate, interactive, in no acute distress  SKIN (Focal Skin Exam): Per patient request, examination of bilateral axillae, chest, abdomen, bilateral thighs, groin, buttocks, and external genitalia was performed    location Abscess Inflamed nodule Non-inflamed nodule Draining sinus Non-draining Sinus Hurley % scar   R axilla                 L axilla                 R inframammary L inframammary                 Intermammary                 Pubic    1             R inguinal         2       R thigh                 L inguinal   2   1         L thigh                 Scrotum   1   1 1        Perianal                 R buttock   4   3        L buttock                 Other (list)                                      AN count (total sum of abscess and inflammatory nodule): 8  Pilonidal sinus? No    -sites not commented on demonstrate normal findings.

## 2023-11-17 NOTE — Unmapped (Signed)
 You are welcome to join the [facility] for HS of the [employer] group online at [email or web address] or in-person at our regular meetings.  You can textHS to [phone] for meeting reminders or join the group online for regular updates.  This can be a great opportunity to interact and learn from other patients and help work with the HS community.  We hope to see your there!    Hidradentis Suppurativa (pronounced ???high-drad-en-eye-tis/sup-your-uh-tee-vah???) is a chronic disease of hair follicles.  The lesions occur most commonly on areas of skin-to-skin contact: under the arms (axillary area), in the groin, around the buttocks, in the region around the anus and genitals, and on the skin between and under the breasts. In women, the underarms, groin, and breast areas are most commonly affected. Men most often have HS lesions on the buttocks and under the arms and may also have HS at the back of the neck and behind and around the ears.    What does HS look and feel like?   The first thing that someone with HS notices is a tender, raised, red bump that looks like an under-the-skin pimple or boil. Sometimes HS lesions have two or more ???heads.???  In mild disease only an occasional boil or abscess may occur, but in more active disease there can be many new lesions every month.  Some abscesses can become larger and may open and drain pus.  Bleeding and increased odor can also occur. In severe disease, deeper abscesses develop and may connect with each other under the skin to form tunnel-like tracts (sinuses, fistulas).  These may drain constantly, or may temporarily improve and then usually begin draining again over time.  In people who have had sinus tracts for some time, scars form that feel like ropes under the skin. In the very worst cases, networks of sinus tracts can form deeper in the body, including the muscle and other tissues. Many people with severe HS have scars that can limit their ability to freely move their arms or legs, though this is very unlikely for most patients.     Clinicians usually classify or ???grade??? HS using the [facility] staging system according to the severity of the disease for each body location:   [place] stage I: one or more abscesses are present, but no sinus tracts have formed and no scars have developed   [place] stage II: one or more abscesses are present that resolve and recur; on sinus tract can be present and scarring is seen   Doreene Adas stage III: many abscesses and more than one sinus tract is present with extensive scars.    What causes HS?  The cause of HS is not completely understood.  It seems to be a disorder of hair follicles and often many family members are affected so genetics probably play a strong role.  Bacteria are often present and may make the disease worse, but infection does not seem to be the main cause. Hormones are also likely play a role since the condition typically starts around puberty when hair follicles under the arms and in the groin start to change.  It can sometimes flare with menstrual cycles in women as well.  In most cases it lasts for decades and starts to improve to some extent in the late 30s and 40s as long as many fistulas have not already formed.  Women are three times more likely than men to develop HS.    Other factors are known to contribute to HS  flaring or becoming worse, though they are likely not the main causes. The factors most commonly associated with HS include:   Cigarette smoking - Stopping smoking will likely not cure the disease, but likely is helpful in reducing how much and how often it flares and may prevent it from getting as bad over time.   Higher weight - HS may occur even in people that are not overweight, but it is much more common in patients that are.  There is some evidence that losing weight and eating a diet low in sugars and fats may be helpful in improving hidradenitis, though this is not helpful for everyone.  Working with a nutritionist may be an important way to help with this and is something your physician can help coordinate    Hidradenitis is not contagious.  It is not caused by a problem with personal hygiene or any other activity or behavior of those with the disease.    How can your doctor help you treat your hidradenitis?  Clinicians use both medication and surgery to treat HS. The choice of treatment--or combination of treatments--is made according to an individual patient???s needs. Clinicians consider several factors in determining the most appropriate plan for therapy:   Severity of disease - medications and some laser treatments are usually able to control disease best when fistulas are not present.  Fistulas typically require surgery.   Extent and location of disease   Chronicity (how often the lesions recur)    A number of different surgical methods have been developed that are useful for certain patients under particular circumstances. These can be done with local numbing and healing at home for some areas when disease is not too extensive with relatively brief recovery times.  In more extensive disease there may be a need for larger excisions under general anesthesia with healing time in the hospital and prolonged recovery periods for better disease control.      In addition, many medical treatments have been tried--some with more success than others. No medication is effective for all patients, and you and your doctor may have to try several different treatments or combinations of treatments before you find the treatment plan that works best for you.  The goals of therapy with medications that are either topical (used on the skin) or systemic (taken by mouth) are:  1. to clear the lesions or at least reduce their number and extent, and  2. to prevent new lesions from forming.  3. To reduce pain, drainage, and odor  Some of the types of medications commonly used are antibacterial skin washes and the topical antibiotics to prevent secondary infections and corticosteroid injections into the lesions to reduce inflammation.     Other medications that may be used include retinoids (similar to Accutane), drugs that effect how hormones and hair follicles interact, drugs that affect your immune system (such as methotrexate, adalimumab/Humira, and Remicaid/infliximab), steroids, and oral antibiotics.    Lasers that destroy hair follicles can also be helpful since they reduce the hair follicles that cause the problems.  Multiple treatments are typically required over time and there is some discomfort associated with treatment, but it is typically very fast and well-tolerated.    It is very important to realize that hidradenitis cannot usually be completely cured with any single medication or surgical procedure.  It is a disease that can be very stubborn and difficult to control, but with good treatment a lot of improvement and sometimes temporary remissions can be  obtained. Poorly controlled disease can cause more fistulas to form and make managing the disease much more difficult over time so it is important to seek care to reduce major flares.  Surgery can provide a long term cure in some areas, though the disease can start again or continue in nearby areas.  A dermatologist is often the best person to help coordinate disease treatment, and sometimes other surgeons, pain specialists, other specialists, and nutritionists may be part of the treatment team.    For severe disease, the first goal is often to reduce pain and symptoms with medicines so that the disease feels more stable. Once it's stable, we often start thinking about how to address areas that have completely gotten better with surgery if they are still causing problems.    What can you do to help your HS?  1. Stopping smoking is hard and may not fix everything, but it may be a step in the right direction.  We or your primary care physician can provide resources to help stop if you are interested.  2. Follow a healthy diet and try to achieve a healthy weight.  Some other self-help measures are:   Keep your skin cool and dry (becoming overheated and sweating can contribute to an HS flare)   To reduce the pain of cysts or nodules or to help them to drain, apply hot compresses or soak in hot water for 10 minutes at a time (use a clean washcloth or a teabag soaked in hot water)   For male patients, cotton underwear that does not have tight elastic in the groin can be helpful.  Boyshort, brief, or boxer style underwear may be a better option as friction on hair follicles in affected areas can be a major trigger in some patients.  These can be easily found on [place] or with some retailers.  Fruit of [employer] Loom and Underworks are two brands that are sometimes recommended.    Finally, know that you are not alone. Coping with the pain and other symptoms of HS can be very difficult, so it may be helpful to connect with others who live with HS. Patient groups and networks can be sources of important information and support. Some internet resources for information and connections are provided below.    Psychologytoday.com is a resource to find psychologists and therapists that can help support you in your are     [email or web address] can help connect with sexual health resources and counselors    Resources for Information    The Hidradenitis Suppurativa Foundation: A nonprofit organized by a group of physicians interested in treating and advancing research in hidradenitis suppurativa.  This group advocates for better care and research for hidradenitis and has educational materials put together specifically for patients that have been reviewed and produced by doctors and people with hidradenitis.    American Academy of Dermatology  ARanked.fi    [employer]  ElevatorPitchers.de.html  [employer]: [employer]  https://www.rarediseases.org/rare-disease-information/rare-diseases/byID/358/viewAbstract  Trials of new medications for HS  Https://www.clinicaltrials.gov

## 2023-11-18 DIAGNOSIS — J209 Acute bronchitis, unspecified: Secondary | ICD-10-CM | POA: Diagnosis not present

## 2023-11-18 MED ORDER — EMPTY CONTAINER
2 refills | 0.00000 days
Start: 2023-11-18 — End: ?

## 2023-11-18 NOTE — Unmapped (Signed)
 [facility] Specialty Medication Onboarding    Specialty Medication: Bimzelx  Prior Authorization: Approved   Financial Assistance: No - copay  <$25  Final Copay/Day Supply: $4 / 84 days (LD)  &  (MD)    Insurance Restrictions: None     Notes to Pharmacist:   Credit Card on File: yes  Start Date on Rx:      The triage team has completed the benefits investigation and has determined that the patient is able to fill this medication at [facility]. Please contact the patient to complete the onboarding or follow up with the prescribing physician as needed.

## 2023-11-18 NOTE — Unmapped (Signed)
 [facility]    Patient Onboarding/Medication Counseling    Dustin Prince is a 63 y.o. male with hidradenitis suppurativa who I am counseling today on initiation of therapy.  I am speaking to the patient.    Was a Nurse, learning disability used for this call? No    Verified patient's date of birth / HIPAA.    Specialty medication(s) to be sent: Inflammatory Disorders: Bimzelx       Non-specialty medications/supplies to be sent: sharps kit      Medications not needed at this time: na         Bimzelx  (bimekizumab )    Medication & Administration     Dosage: Hidradenitis Suppurativa: Inject 320 mg at Week 0, 2, 4, 6, 8, 10, 12, 14 and 16, then every 4 weeks thereafter      Lab tests required prior to treatment initiation:  Tuberculosis: Tuberculosis screening resulted in a non-reactive Quantiferon TB Gold assay.  Liver Function Tests: LFTs, Alk Phos, and bilirubin are documented in the patient's chart.      Administration:     Prefilled auto-injector pen  - 320 mg pen.  Gather all supplies needed for injection on a clean, flat working surface: medication pen removed from packaging, alcohol swab, sharps container, etc.  Look at the medication label - look for correct medication, correct dose, and check the expiration date  Check the medicine through the viewing window. The medicine should be slightly pearly and colorless to pale brownish-yellow and free of particles. You may see air bubbles in the liquid. This is normal. Do not used the pre-filled pen if the medicine is cloudy, discolored, or has particles.   Lay the auto-injector pen on a flat surface and allow it to warm up to room temperature for at least 30-45 minutes  Select injection site - you can use the front of your thigh or your belly (but not the area 2 inches around your belly button); if someone else is giving you the injection you can also use your upper arm in the skin covering your triceps muscle  Prepare injection site - wash your hands and clean the skin at the injection site with an alcohol swab and let it air dry, do not touch the injection site again before the injection  Pull off the safety cap, do not remove until immediately prior to injection and do not touch the green needle cover  Put the green needle cover against your skin at the injection site at a 90 degree angle, hold the pen such that you can see the clear medication window  Press down and hold the pen firmly against your skin, there will be a click when the injection starts  Continue to hold the pen firmly against your skin for about 15 seconds - the window will start to turn solid yellow  There will be a second click sound when the injection is almost complete, verify the window is solid yellow to indicate the injection is complete and then pull the pen away from your skin  Dispose of the used auto-injector pen immediately in your sharps disposal container the needle will be covered automatically  If you see any blood at the injection site, press a cotton ball or gauze on the site and maintain pressure until the bleeding stops, do not rub the injection site    Prefilled syringe  1. Gather all supplies needed for injection on a clean, flat working surface: medication syringe(s) removed from packaging, alcohol swab,  sharps container, etc.  2. Look at the medication label - look for correct medication, correct dose, and check the expiration date  3. Look at the medication - the liquid in the syringe should appear slightly pearly and colorless to pale brownish-yellow  4. Lay the syringe on a flat surface and allow it to warm up to room temperature for at least 30-45 minutes  5. Select injection site - you can use the front of your thigh or your belly (but not the area 2 inches around your belly button)  6. Prepare injection site - wash your hands and clean the skin at the injection site with an alcohol swab and let it air dry, do not touch the injection site again before the injection  7. Pull off the needle safety cap, do not remove until immediately prior to injection  8. Pinch the skin - with your hand not holding the syringe pinch up a fold of skin at the injection site using your forefinger and thumb  9. Insert the needle into the fold of skin at about a 45 degree angle - it's best to use a quick dart-like motion  10.Push the plunger down slowly as far as it will go until the syringe is empty  11. Check that the syringe is empty then remove pressure from plunger head. This will cause the needle to automatically retract and lock.    12. Dispose of the used syringe immediately in your sharps disposal container, do not attempt to recap the needle prior to disposing  13. If you see any blood at the injection site, press a cotton ball or gauze on the site and maintain pressure until the bleeding stops, do not rub the injection site      Adherence/Missed dose instructions:  If your injection is given more than 4 days after your scheduled injection date - consult your pharmacist for additional instructions on how to adjust your dosing schedule.        Goals of Therapy     Plaque Psoriasis  Minimize areas of skin involvement (% BSA)  Avoidance of long term glucocorticoid use  Maintenance of effective psychosocial functioning    Hidradenitis Suppurativa  Reduce the frequency and severity of new lesions  Minimize pain and suppuration  Prevent disease progression and limit scarring  Maintenance of effective psychosocial functioning      Side Effects & Monitoring Parameters     Injection site reaction (redness, irritation, inflammation localized to the site of administration)  Signs of a common cold - minor sore throat, runny or stuffy nose, etc.  Diarrhea  Mood changes/suicidal ideation  Liver Function Tests    The following side effects should be reported to the provider:  Signs of a hypersensitivity reaction - rash; hives; itching; red, swollen, blistered, or peeling skin; wheezing; tightness in the chest or throat; difficulty breathing, swallowing, or talking; swelling of the mouth, face, lips, tongue, or throat; etc.  Reduced immune function - report signs of infection such as fever; chills; body aches; very bad sore throat; ear or sinus pain; cough; more sputum or change in color of sputum; pain with passing urine; wound that will not heal, etc.  Also at a slightly higher risk of some malignancies (mainly skin and blood cancers) due to this reduced immune function.  In the case of signs of infection - the patient should hold the next dose of Bimzelx ?? and call your primary care provider to ensure adequate medical care.  Treatment may  be resumed when infection is treated and patient is asymptomatic.  Muscle pain or weakness  Shortness of breath      Warnings, Precautions, & Contraindications     Have your bloodwork checked as you have been told by your prescriber  Talk with your doctor if you are pregnant, planning to become pregnant, or breastfeeding  Discuss the possible need for holding your dose(s) of Bimzelx ?? when a planned procedure is scheduled with the prescriber as it may delay healing/recovery timeline       Drug/Food Interactions     Medication list reviewed in Epic. The patient was instructed to inform the care team before taking any new medications or supplements. No drug interactions identified.   Talk with you prescriber or pharmacist before receiving any live vaccinations while taking this medication and after you stop taking it      Storage, Handling Precautions, & Disposal     Store this medication in the refrigerator.  Do not freeze  May store intact Bimzelx  pens and 160 mg/mL prefilled syringes at <=25??C (<=77??F) for up to 30 days; do NOT return to the refrigerator   Store in original packaging, protected from light  Do not shake  Dispose of used syringes/pens in a sharps disposal container           Current Medications (including OTC/herbals), Comorbidities and Allergies     Current Medications[1]    Allergies[2]    Problem List[3]    Medication list has been reviewed and updated in Epic: Yes    Allergies have been reviewed and updated in Epic: Yes    Appropriateness of Therapy     Acute infections noted within Epic:  No active infections  Patient reported infection: None    Is the medication and dose appropriate based on diagnosis, medication list, comorbidities, allergies, medical history, patient???s ability to self-administer the medication, and therapeutic goals? Yes    Prescription has been clinically reviewed: Yes      Baseline Quality of Life Assessment      How many days over the past month did your HS  keep you from your normal activities? For example, brushing your teeth or getting up in the morning. Patient declined to answer    Financial Information     Medication Assistance provided: Prior Authorization    Anticipated copay of $4 reviewed with patient. Verified delivery address.    Delivery Information     Scheduled delivery date: 8/21    Expected start date: 9/4 (patient will plan to finish Humira  pens first)      Medication will be delivered via UPS to the prescription address in [facility].  This shipment will not require a signature.      Explained the services we provide at [facility] and that each month we would call to set up refills.  Stressed importance of returning phone calls so that we could ensure they receive their medications in time each month.  Informed patient that we should be setting up refills 7-10 days prior to when they will run out of medication.  A pharmacist will reach out to perform a clinical assessment periodically.  Informed patient that a welcome packet, containing information about our pharmacy and other support services, a Notice of Privacy Practices, and a drug information handout will be sent.      The patient or caregiver noted above participated in the development of this care plan and knows that they can request review of  or adjustments to the care plan at any time.      Patient or caregiver verbalized understanding of the above information as well as how to contact the pharmacy at [phone] option 4 with any questions/concerns.  The pharmacy is open Monday through Friday 8:30am-4:30pm.  A pharmacist is available 24/7 via pager to answer any clinical questions they may have.    Patient Specific Needs     Does the patient have any physical, cognitive, or cultural barriers? No    Does the patient have adequate living arrangements? (i.e. the ability to store and take their medication appropriately) Yes    Did you identify any home environmental safety or security hazards? No    Patient prefers to have medications discussed with  Patient     Is the patient or caregiver able to read and understand education materials at a high school level or above? Yes    Patient's primary language is  English     Is the patient high risk? No    Does the patient have an additional or emergency contact listed in their chart? Yes    SOCIAL DETERMINANTS OF HEALTH     At the [facility], we have learned that life circumstances - like trouble affording food, housing, utilities, or transportation can affect the health of many of our patients.   That is why we wanted to ask: are you currently experiencing any life circumstances that are negatively impacting your health and/or quality of life? Patient declined to answer    Social Drivers of Health     Food Insecurity: No Food Insecurity (09/12/2023)    Received from [facility]    Hunger Vital Sign     Within the past 12 months, you worried that your food would run out before you got the money to buy more.: Never true     Within the past 12 months, the food you bought just didn't last and you didn't have money to get more.: Never true   Tobacco Use: Low Risk  (10/31/2023)    Received from [facility]    Patient History     Smoking Tobacco Use: Never     Smokeless Tobacco Use: Never     Passive Exposure: Not on file   Transportation Needs: No Transportation Needs (09/12/2023)    Received from [facility] - Transportation     In the past 12 months, has lack of transportation kept you from medical appointments or from getting medications?: No     In the past 12 months, has lack of transportation kept you from meetings, work, or from getting things needed for daily living?: No   Alcohol Use: Not on file   Housing: Not on file   Physical Activity: Insufficiently Active (12/10/2017)    Received from [facility]    Exercise Vital Sign     Days of Exercise per Week: 2 days     Minutes of Exercise per Session: 10 min   Utilities: Not At Risk (09/12/2023)    Received from [facility] Utilities     In the past 12 months has the electric, gas, oil, or water company threatened to shut off services in your home?: No   Stress: No Stress Concern Present (12/10/2017)    Received from [facility] - Occupational Stress Questionnaire     Feeling of Stress : Only a little  Interpersonal Safety: Not on file   Substance Use: Not on file (02/04/2023)   Intimate Partner Violence: Not At Risk (09/12/2023)    Received from [facility]    Humiliation, Afraid, Rape, and Kick questionnaire     Within the last year, have you been afraid of your partner or ex-partner?: No     Within the last year, have you been humiliated or emotionally abused in other ways by your partner or ex-partner?: No     Within the last year, have you been kicked, hit, slapped, or otherwise physically hurt by your partner or ex-partner?: No     Within the last year, have you been raped or forced to have any kind of sexual activity by your partner or ex-partner?: No   Social Connections: Somewhat Isolated (12/10/2017)    Received from [facility]    Social Connection and Isolation Panel     Frequency of Communication with Friends and Family: Three times a week     Frequency of Social Gatherings with Friends and Family: More than three times a week     Attends Religious Services: Never     Database administrator or Organizations: No     Attends Engineer, structural: Never     Marital Status: Living with partner   Physicist, medical Strain: Medium Risk (02/18/2022)    Received from [facility]    Overall Financial Resource Strain (CARDIA)     Difficulty of Paying Living Expenses: Somewhat hard   Health Literacy: Not on file   Internet Connectivity: Not on file       Would you be willing to receive help with any of the needs that you have identified today? Not applicable       Chinmay Squier [facility] Specialty Pharmacist         [1]   Current Outpatient Medications   Medication Sig Dispense Refill    acetaminophen  (TYLENOL ) 500 MG tablet Take 2 tablets (1,000 mg total) by mouth.      bictegrav-emtricit-tenofov ala (BIKTARVY) 50-200-25 mg tablet Take 1 tablet by mouth daily.      bimekizumab -bkzx (BIMZELX  AUTOINJECTOR) 320 mg/2 mL AtIn Inject the contents of 1 auto-injector (320mg ) under the skin at week 0, 2, 4, 6, 8, 10, 12, 14, 16 4 mL 4    bimekizumab -bkzx (BIMZELX  AUTOINJECTOR) 320 mg/2 mL AtIn Inject the contents of 1 auto-injector (320mg ) under the skin every 4 weeks for maintenance 2 mL 11    cabotegravir-rilpivirine (CABENUVA) 600 mg-900 mg/3 mL extended-release injection Inject 1 kit into the muscle.      empty container Misc Use as directed to dispose of Bimzelx  pens. 1 each 2    HUMIRA  PEN CITRATE FREE 40 MG/0.4 ML Inject the contents of 1 pen (40 mg total) under the skin once a week. 4 each 11    losartan  (COZAAR ) 50 MG tablet       methocarbamol (ROBAXIN) 750 MG tablet Take 1 tablet (750 mg total) by mouth.      metroNIDAZOLE  (FLAGYL ) 250 MG tablet TAKE 1 TABLET BY MOUTH 3 TIMES DAILY 90 tablet 10    pantoprazole (PROTONIX) 40 MG tablet       rivaroxaban (XARELTO) 20 mg tablet Take 20 mg by mouth daily with evening meal.      rosuvastatin (CRESTOR) 10 MG tablet Take 1 tablet (10 mg total) by mouth daily.      triamcinolone  (KENALOG ) 0.1 %  ointment APPLY TOPICALLY TWICE DAILY TO AFFECTED AREAS AS NEEDED FOR DERMATITIS IN THE GLUTEAL CLEFT 454 g 10     No current facility-administered medications for this visit.   [2]   Allergies  Allergen Reactions    Sulfasalazine      Other reaction(s): Other (See Comments)  Low blood pressure    Vancomycin Itching     Give with benadryl   [3]   Patient Active Problem List  Diagnosis    Hidradenitis suppurativa    HIV (human immunodeficiency virus infection)        Asthma (HHS-HCC)    At high risk for cardiovascular disease    Avascular necrosis of bone of hip, left        Chest pain    Cholelithiasis    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        Chronic kidney disease, stage 3a (CMS-HCC)    Diverticulosis of colon without hemorrhage    DVT (deep venous thrombosis)        GERD (gastroesophageal reflux disease)    Hypertension    Morbid obesity (CMS-HCC)    Multiple lipomas    Numbness    Sleep disorder    Solitary pulmonary nodule    Subdural hematoma (CMS-HCC)    Subcutaneous nodules    Symptomatic bradycardia    Weight loss

## 2023-11-19 ENCOUNTER — Encounter (HOSPITAL_BASED_OUTPATIENT_CLINIC_OR_DEPARTMENT_OTHER): Payer: Self-pay | Admitting: Family Medicine

## 2023-11-19 ENCOUNTER — Ambulatory Visit (HOSPITAL_BASED_OUTPATIENT_CLINIC_OR_DEPARTMENT_OTHER): Admitting: Family Medicine

## 2023-11-19 VITALS — BP 126/66 | HR 53 | Ht 72.0 in | Wt 283.9 lb

## 2023-11-19 DIAGNOSIS — H00015 Hordeolum externum left lower eyelid: Secondary | ICD-10-CM

## 2023-11-19 DIAGNOSIS — H00019 Hordeolum externum unspecified eye, unspecified eyelid: Secondary | ICD-10-CM | POA: Insufficient documentation

## 2023-11-19 DIAGNOSIS — Z0000 Encounter for general adult medical examination without abnormal findings: Secondary | ICD-10-CM

## 2023-11-19 DIAGNOSIS — N1831 Chronic kidney disease, stage 3a: Secondary | ICD-10-CM | POA: Diagnosis not present

## 2023-11-19 MED FILL — EMPTY CONTAINER: 120 days supply | Qty: 1 | Fill #0

## 2023-11-19 NOTE — Progress Notes (Signed)
 New Patient Office Visit  Subjective   Patient ID: Allen Espinoza, male    DOB: 1961-01-27  Age: 63 y.o. MRN: [id number]  CC:  Chief Complaint  Patient presents with   New Patient (Initial Visit)    New Patient Dr Henry patient has stye on left eye has boils on bottom but sees skin specialist high blood pressure and keeping it under control     HPI Allen Espinoza presents to establish care Last PCP - Dr. Henry  CAD, HTN: follows with cardiology, recent issues with orthostatic hypotension. Prescribed statin, denies issues with medication.   CKD: stage 3 on history. Labs look to be on borderline of stage 3 vs 2. Sees [facility], typically about every 6 months.  HIV: asymptomatic, follows with ID. Last appointment about 1 month ago. Currently on Cabenuva . Sees ID every other month.  HS: follows with derm - [facility]. Affecting groin/buttock area.  Reports issue with stye. Left eye, started about 1 week ago. Has tried warm compress. He thinks area involved may have increased slightly. Area sore, no eye pain. No vision changes.  Elevated PSA in the past, follows with [facility].  Patient is originally from [place]. Has lived here for about 8 years.  Outpatient Encounter Medications as of 11/19/2023  Medication Sig   acetaminophen  (TYLENOL ) 500 MG tablet Take 1,000 mg by mouth daily as needed for moderate pain (pain score 4-6) or headache.   adalimumab  (HUMIRA , 2 PEN,) 40 MG/0.4ML pen Inject 40 mg into the skin every Thursday.   cabotegravir  & rilpivirine  ER (CABENUVA ) 600 & 900 MG/3ML injection Inject 1 kit into the muscle every 2 (two) months.   latanoprost (XALATAN) 0.005 % ophthalmic solution Place 1 drop into both eyes at bedtime.   metroNIDAZOLE  (FLAGYL ) 250 MG tablet Take 250 mg by mouth 3 (three) times daily.   Multiple Vitamin (MULTIVITAMIN WITH MINERALS) TABS tablet Take 1 tablet by mouth daily.   triamcinolone  ointment (KENALOG ) 0.1 % Apply 1 Application topically 2  (two) times daily.   XARELTO  20 MG TABS tablet Take 20 mg by mouth daily.   rosuvastatin  (CRESTOR ) 10 MG tablet TAKE 1 TABLET BY MOUTH DAILY (Patient not taking: Reported on 11/19/2023)   No facility-administered encounter medications on file as of 11/19/2023.    Past Medical History:  Diagnosis Date   Abscess    Between legs   Arthritis    all over   Asthma    Avascular necrosis of femoral head, left (HCC)    Blood transfusion without reported diagnosis    Cholelithiasis    CKD (chronic kidney disease) stage 3, GFR 30-59 ml/min (HCC)    sees kidney Dr.   Merceda disorder (HCC)    left DVT   Diverticulosis    DVT (deep venous thrombosis) (HCC)    legs   Dyspnea    when walking   Dysrhythmia    remembers mother taking about having an irregular rhythm years when he was a child    GERD (gastroesophageal reflux disease)    HIV (human immunodeficiency virus infection) (HCC)    Hypertension    Morbid obesity (HCC)     Past Surgical History:  Procedure Laterality Date   COLONOSCOPY  2019   DENTAL SURGERY     had teeth pulled   HYDRADENITIS EXCISION Left 10/14/2016   Procedure: WIDE EXCISION HIDRADENITIS LEFT AXILLA;  Surgeon: Vernetta Berg, MD;  Location: [facility];  Service: General;  Laterality: Left;   JOINT  REPLACEMENT     Left hip Dr. Vernetta 11/29/16   TOTAL HIP ARTHROPLASTY Left 11/29/2016   Procedure: LEFT TOTAL HIP ARTHROPLASTY ANTERIOR APPROACH;  Surgeon: Vernetta Lonni GRADE, MD;  Location: [facility];  Service: Orthopedics;  Laterality: Left;    Family History  Problem Relation Age of Onset   Heart attack Mother 66   Heart attack Father 24   Prostate cancer Brother    Bone cancer Maternal Aunt    Breast cancer Maternal Aunt    Colon cancer Neg Hx    Esophageal cancer Neg Hx    Stomach cancer Neg Hx    Rectal cancer Neg Hx    Migraines Neg Hx    Headache Neg Hx     Social History   Socioeconomic History   Marital status: Single    Spouse name: Not on  file   Number of children: 2   Years of education: Not on file   Highest education level: Not on file  Occupational History   Occupation: Disability  Tobacco Use   Smoking status: Never    Passive exposure: Never   Smokeless tobacco: Never  Vaping Use   Vaping status: Never Used  Substance and Sexual Activity   Alcohol use: No    Comment: prior   Drug use: No    Comment: prior cocaine use, last 2005   Sexual activity: Not Currently    Partners: Female    Birth control/protection: Condom  Other Topics Concern   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, mailroom, warehouse work   Lives w/ fiancee   No EtOH, tobacco, drugs   Social Drivers of Corporate investment banker Strain: Medium Risk (02/18/2022)   Overall Financial Resource Strain (CARDIA)    Difficulty of Paying Living Expenses: Somewhat hard  Food Insecurity: No Food Insecurity (09/12/2023)   Hunger Vital Sign    Worried About Running Out of Food in the Last Year: Never true    Ran Out of Food in the Last Year: Never true  Transportation Needs: No Transportation Needs (09/12/2023)   PRAPARE - Administrator, Civil Service (Medical): No    Lack of Transportation (Non-Medical): No  Physical Activity: Insufficiently Active (12/10/2017)   Exercise Vital Sign    Days of Exercise per Week: 2 days    Minutes of Exercise per Session: 10 min  Stress: No Stress Concern Present (12/10/2017)   [employer] of Occupational Health - Occupational Stress Questionnaire    Feeling of Stress : Only a little  Social Connections: Somewhat Isolated (12/10/2017)   Social Connection and Isolation Panel    Frequency of Communication with Friends and Family: Three times a week    Frequency of Social Gatherings with Friends and Family: More than three times a week    Attends Religious Services: Never    Database administrator or Organizations: No    Attends Banker Meetings:  Never    Marital Status: Living with partner  Intimate Partner Violence: Not At Risk (09/12/2023)   Humiliation, Afraid, Rape, and Kick questionnaire    Fear of Current or Ex-Partner: No    Emotionally Abused: No    Physically Abused: No    Sexually Abused: No    Objective   BP 126/66 (BP Location: Left Arm, Patient Position: Sitting, Cuff Size: Large)   Pulse (!) 53   Ht 6' (1.829 m)   Wt 283 lb 14.4  oz (128.8 kg)   SpO2 100%   BMI 38.50 kg/m   Physical Exam  63 year old male in no acute distress Cardiovascular exam with regular rate and rhythm Lungs clear to auscultation bilaterally. Along the left lower eyelid, there is small nodule noted towards the central aspect.  No current discharge or drainage appreciated.  Extraocular movements are intact.  No nystagmus.  Assessment & Plan:   Chronic kidney disease, stage 3a (HCC) Assessment & Plan: Noted on history.  Recommend continued follow-up with nephrologist.  We can monitor intermittently on labs.  We will also need to take this into consideration in regards to medication management.   Hordeolum externum of left lower eyelid Assessment & Plan: Discussed general recommendations.  Recommend continuing with intermittent warm compress throughout the day.  Discussed expected time course for resolution.  Advised that if not resolving as expected, to let us  know and we can arrange for evaluation with eye specialist.   Wellness examination -     CBC with Differential/Platelet; Future -     Comprehensive metabolic panel with GFR; Future -     Hemoglobin A1c; Future -     Lipid panel; Future -     TSH Rfx on Abnormal to Free T4; Future  Return in about 2 months (around 01/19/2024) for CPE with fasting labs 1 week prior.    ___________________________________________ Braxston Quinter de Peru, MD, ABFM, CAQSM Primary Care and Sports Medicine [facility]

## 2023-11-19 NOTE — Patient Instructions (Signed)
  Medication Instructions:  Your physician recommends that you continue on your current medications as directed. Please refer to the Current Medication list given to you today. --If you need a refill on any your medications before your next appointment, please call your pharmacy first. If no refills are authorized on file call the office.-- Lab Work: Your physician has recommended that you have lab work today: 1 week before next visit  If you have labs (blood work) drawn today and your tests are completely normal, you will receive your results via MyChart message OR a phone call from our staff.  Please ensure you check your voicemail in the event that you authorized detailed messages to be left on a delegated number. If you have any lab test that is abnormal or we need to change your treatment, we will call you to review the results.  Follow-Up: Your next appointment:   Your physician recommends that you schedule a follow-up appointment in: 2-3 months physical with Dr. de Peru  You will receive a text message or e-mail with a link to a survey about your care and experience with [place] today! We would greatly appreciate your feedback!   Thanks for letting us be apart of your health journey!!  Primary Care and Sports Medicine   Dr. Ceasar Mons Peru   We encourage you to activate your patient portal called "MyChart".  Sign up information is provided on this After Visit Summary.  MyChart is used to connect with patients for Virtual Visits (Telemedicine).  Patients are able to view lab/test results, encounter notes, upcoming appointments, etc.  Non-urgent messages can be sent to your provider as well. To learn more about what you can do with MyChart, please visit --  [email or web address].

## 2023-11-19 NOTE — Assessment & Plan Note (Signed)
 Discussed general recommendations.  Recommend continuing with intermittent warm compress throughout the day.  Discussed expected time course for resolution.  Advised that if not resolving as expected, to let us  know and we can arrange for evaluation with eye specialist.

## 2023-11-19 NOTE — Assessment & Plan Note (Signed)
 Noted on history.  Recommend continued follow-up with nephrologist.  We can monitor intermittently on labs.  We will also need to take this into consideration in regards to medication management.

## 2023-11-24 ENCOUNTER — Ambulatory Visit: Admitting: General Practice

## 2023-11-30 DIAGNOSIS — I493 Ventricular premature depolarization: Secondary | ICD-10-CM | POA: Diagnosis not present

## 2023-11-30 NOTE — Progress Notes (Unsigned)
 Cardio.orthostatic logy Office Note:    Date:  12/02/2023   ID:  Allen Espinoza, DOB 1960-06-29, MRN [id number]  PCP:  de Peru, Raymond J, MD  Cardiologist:  None  Electrophysiologist:  None   Referring MD: [name], MD   Chief Complaint  Patient presents with   Dizziness    History of Present Illness:    Allen Espinoza is a 63 y.o. male with a hx of CKD stage III, DVT on Xarelto , HIV, asthma who presents for follow-up.  He was seen as a consult by Dr. Alveta 08/2023.  He had presented to the ED with chest pain and shortness of breath.  EKG without ischemic changes troponin negative.  Outpatient coronary CTA was recommended.  Echocardiogram 09/12/2023 showed normal biventricular function, no significant valvular disease.  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  Stress test was recommended to evaluate for ischemia but prior to treadmill test was noted to be orthostatic and sent to ED.  He had been on as needed Lasix  but was taking every day.  He received IV fluids in the ED and was told to just take Lasix  as needed.  Zio patch x 7 days 10/2023 showed 1 episode of SVT lasting 11 beats, frequent PVCs (6.7% of beats).  Since last clinic visit, he reports he is doing okay.  States that his lightheadedness has improved, continues to have some lightheadedness with standing but is doing much better.  Denies any syncope.  Reports occasional dyspnea but no chest pain.    Past Medical History:  Diagnosis Date   Abscess    Between legs   Arthritis    all over   Asthma    Avascular necrosis of femoral head, left (HCC)    Blood transfusion without reported diagnosis    Cholelithiasis    CKD (chronic kidney disease) stage 3, GFR 30-59 ml/min (HCC)    sees kidney Dr.   Merceda disorder (HCC)    left DVT   Diverticulosis    DVT (deep venous  thrombosis) (HCC)    legs   Dyspnea    when walking   Dysrhythmia    remembers mother taking about having an irregular rhythm years when he was a child    GERD (gastroesophageal reflux disease)    HIV (human immunodeficiency virus infection) (HCC)    Hypertension    Morbid obesity (HCC)     Past Surgical History:  Procedure Laterality Date   COLONOSCOPY  2019   DENTAL SURGERY     had teeth pulled   HYDRADENITIS EXCISION Left 10/14/2016   Procedure: WIDE EXCISION HIDRADENITIS LEFT AXILLA;  Surgeon: Vernetta Berg, MD;  Location: [facility] OR;  Service: General;  Laterality: Left;   JOINT REPLACEMENT     Left hip Dr. Vernetta 11/29/16   TOTAL HIP ARTHROPLASTY Left 11/29/2016   Procedure: LEFT TOTAL HIP ARTHROPLASTY ANTERIOR APPROACH;  Surgeon: Vernetta Lonni GRADE, MD;  Location: [facility];  Service: Orthopedics;  Laterality: Left;    Current Medications: Current Meds  Medication Sig   acetaminophen  (TYLENOL ) 500 MG tablet Take 1,000 mg by mouth daily as needed for moderate pain (pain score 4-6) or headache.   adalimumab  (HUMIRA , 2 PEN,) 40 MG/0.4ML pen Inject 40 mg into the skin every Thursday.   cabotegravir  & rilpivirine  ER (CABENUVA ) 600 & 900 MG/3ML injection Inject 1 kit  into the muscle every 2 (two) months.   latanoprost (XALATAN) 0.005 % ophthalmic solution Place 1 drop into both eyes at bedtime.   metroNIDAZOLE  (FLAGYL ) 250 MG tablet Take 250 mg by mouth 3 (three) times daily.   Multiple Vitamin (MULTIVITAMIN WITH MINERALS) TABS tablet Take 1 tablet by mouth daily.   rosuvastatin  (CRESTOR ) 10 MG tablet TAKE 1 TABLET BY MOUTH DAILY   triamcinolone  ointment (KENALOG ) 0.1 % Apply 1 Application topically 2 (two) times daily.   XARELTO  20 MG TABS tablet Take 20 mg by mouth daily.     Allergies:   Sulfa antibiotics and Firvanq  [vancomycin ]   Social History   Socioeconomic History   Marital status: Single    Spouse name: Not on file   Number of children: 2   Years of  education: Not on file   Highest education level: Not on file  Occupational History   Occupation: Disability  Tobacco Use   Smoking status: Never    Passive exposure: Never   Smokeless tobacco: Never  Vaping Use   Vaping status: Never Used  Substance and Sexual Activity   Alcohol use: No    Comment: prior   Drug use: No    Comment: prior cocaine use, last 2005   Sexual activity: Not Currently    Partners: Female    Birth control/protection: Condom  Other Topics Concern   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, mailroom, warehouse work   Lives w/ fiancee   No EtOH, tobacco, drugs   Social Drivers of Corporate investment banker Strain: Medium Risk (02/18/2022)   Overall Financial Resource Strain (CARDIA)    Difficulty of Paying Living Expenses: Somewhat hard  Food Insecurity: No Food Insecurity (09/12/2023)   Hunger Vital Sign    Worried About Running Out of Food in the Last Year: Never true    Ran Out of Food in the Last Year: Never true  Transportation Needs: No Transportation Needs (09/12/2023)   PRAPARE - Administrator, Civil Service (Medical): No    Lack of Transportation (Non-Medical): No  Physical Activity: Insufficiently Active (12/10/2017)   Exercise Vital Sign    Days of Exercise per Week: 2 days    Minutes of Exercise per Session: 10 min  Stress: No Stress Concern Present (12/10/2017)   [employer] of Occupational Health - Occupational Stress Questionnaire    Feeling of Stress : Only a little  Social Connections: Somewhat Isolated (12/10/2017)   Social Connection and Isolation Panel    Frequency of Communication with Friends and Family: Three times a week    Frequency of Social Gatherings with Friends and Family: More than three times a week    Attends Religious Services: Never    Database administrator or Organizations: No    Attends Engineer, structural: Never    Marital Status: Living with partner      Family History: The patient's family history includes Bone cancer in his maternal aunt; Breast cancer in his maternal aunt; Heart attack (age of onset: 58) in his father and mother; Prostate cancer in his brother. There is no history of Colon cancer, Esophageal cancer, Stomach cancer, Rectal cancer, Migraines, or Headache.  ROS:   Please see the history of present illness.     All other systems reviewed and are negative.  EKGs/Labs/Other Studies Reviewed:    The following studies were reviewed today:   EKG:   11/06/2023: Sinus  bradycardia, frequent PVCs, rate 50  Recent Labs: 10/31/2023: ALT 11; B Natriuretic Peptide 79.6; Hemoglobin 14.0; Platelets 214 11/11/2023: BUN 7; Creatinine, Ser 1.14; Magnesium 2.0; Potassium 4.4; Sodium 142; TSH 0.790  Recent Lipid Panel    Component Value Date/Time   CHOL 129 10/14/2023 0925   CHOL 150 03/09/2021 1017   TRIG 60 10/14/2023 0925   HDL 71 10/14/2023 0925   HDL 65 03/09/2021 1017   CHOLHDL 1.8 10/14/2023 0925   VLDL 12 09/12/2023 1836   LDLCALC 44 10/14/2023 0925    Physical Exam:    VS:  BP (!) 148/80 (BP Location: Right Arm, Patient Position: Sitting, Cuff Size: Large)   Pulse (!) 52   Ht 6' (1.829 m)   Wt 282 lb 6.4 oz (128.1 kg)   SpO2 98%   BMI 38.30 kg/m     Wt Readings from Last 3 Encounters:  12/02/23 282 lb 6.4 oz (128.1 kg)  11/19/23 283 lb 14.4 oz (128.8 kg)  11/06/23 291 lb 12.8 oz (132.4 kg)     GEN:  Well nourished, well developed in no acute distress HEENT: Normal NECK: No JVD; No carotid bruits CARDIAC: RRR, no murmurs, rubs, gallops RESPIRATORY:  Clear to auscultation without rales, wheezing or rhonchi  ABDOMEN: Soft, non-tender, non-distended MUSCULOSKELETAL:  No edema; No deformity  SKIN: Warm and dry NEUROLOGIC:  Alert and oriented x 3 PSYCHIATRIC:  Normal affect   ASSESSMENT:    1. Orthostatic hypotension   2. PVC's (premature ventricular contractions)   3. Precordial pain   4.  Hyperlipidemia, unspecified hyperlipidemia type     PLAN:    Orthostatic hypotension: Noted on ED visit 8/1.  His Lasix  was discontinued.  Also stopped his olmesartan  and amlodipine .  Orthostatics in clinic 8/7  continue to show significant drop in BP with standing.  Discussed staying well-hydrated and use of compression stockings.  Would continue to hold antihypertensives and Lasix .  BMET, TSH unremarkable on 8/12 - He reports symptoms have significantly improved but continues to be orthostatic in clinic today.  Discussed conservative measures including aggressive hydration and compression stockings  Frequent PVCs: Zio patch x 7 days 10/2023 showed 1 episode of SVT lasting 11 beats, frequent PVCs (6.7% of beats).  Appears asymptomatic, echocardiogram 08/2023 without structural heart disease, no indication for treatment at this time  Chest plain/DOE:  Echocardiogram 09/12/2023 showed normal biventricular function, no significant valvular disease.  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 - Recommend stress test to rule out ischemia from anomalous RCA with interarterial course.  This was planned for 8/1, but was canceled due to orthostatic hypotension.  Will plan stress test once orthostasis resolved  Hypertension: Previously on olmesartan  20 mg daily and amlodipine  10 mg daily, but currently on hold given orthostatic hypotension as above  DVT: On Xarelto   Hyperlipidemia: On rosuvastatin  10 mg daily.  LDL 44 on 10/14/2023  HIV: On HAART  RTC in 3 months  Medication Adjustments/Labs and Tests Ordered: Current medicines are reviewed at length with the patient today.  Concerns regarding medicines are outlined above.  No orders of the defined types were placed in this encounter.  No orders of the defined types were placed in this  encounter.   Patient Instructions  Medication Instructions:  Continue current medications *If you need a refill on your cardiac medications before your next appointment, please  call your pharmacy*  Lab Work: none If you have labs (blood work) drawn today and your tests are completely normal, you will receive your results only by: MyChart Message (if you have MyChart) OR A paper copy in the mail If you have any lab test that is abnormal or we need to change your treatment, we will call you to review the results.  Testing/Procedures: none  Follow-Up: At [facility], you and your health needs are our priority.  As part of our continuing mission to provide you with exceptional heart care, our providers are all part of one team.  This team includes your primary Cardiologist (physician) and Advanced Practice Providers or APPs (Physician Assistants and Nurse Practitioners) who all work together to provide you with the care you need, when you need it.  Your next appointment:   3 months  Provider:   Dr. Kate  We recommend signing up for the patient portal called MyChart.  Sign up information is provided on this After Visit Summary.  MyChart is used to connect with patients for Virtual Visits (Telemedicine).  Patients are able to view lab/test results, encounter notes, upcoming appointments, etc.  Non-urgent messages can be sent to your provider as well.   To learn more about what you can do with MyChart, go to [email or web address].   Other Instructions Please increase hydration and as your provider recommendation please put compression stocking on in morning and take off at bedtime       Signed, Lonni LITTIE Kate, MD  12/02/2023 12:54 PM    [facility]

## 2023-12-02 ENCOUNTER — Other Ambulatory Visit (HOSPITAL_COMMUNITY): Payer: Self-pay

## 2023-12-02 ENCOUNTER — Ambulatory Visit: Attending: Cardiology | Admitting: Cardiology

## 2023-12-02 ENCOUNTER — Encounter: Payer: Self-pay | Admitting: Cardiology

## 2023-12-02 ENCOUNTER — Other Ambulatory Visit: Payer: Self-pay

## 2023-12-02 VITALS — BP 148/80 | HR 52 | Ht 72.0 in | Wt 282.4 lb

## 2023-12-02 DIAGNOSIS — R072 Precordial pain: Secondary | ICD-10-CM | POA: Diagnosis not present

## 2023-12-02 DIAGNOSIS — I951 Orthostatic hypotension: Secondary | ICD-10-CM

## 2023-12-02 DIAGNOSIS — I493 Ventricular premature depolarization: Secondary | ICD-10-CM

## 2023-12-02 DIAGNOSIS — E785 Hyperlipidemia, unspecified: Secondary | ICD-10-CM | POA: Diagnosis not present

## 2023-12-02 NOTE — Progress Notes (Signed)
 Specialty Pharmacy Refill Coordination Note  Allen Espinoza is a 63 y.o. male assessed today regarding refills of clinic administered specialty medication(s) Cabotegravir  & Rilpivirine  (CABENUVA )   Clinic requested Courier to Provider Office   Delivery date: 12/11/23   Verified address: [address] [place] [address]   Medication will be filled on 12/10/23.

## 2023-12-02 NOTE — Patient Instructions (Signed)
 Medication Instructions:  Continue current medications *If you need a refill on your cardiac medications before your next appointment, please call your pharmacy*  Lab Work: none If you have labs (blood work) drawn today and your tests are completely normal, you will receive your results only by: MyChart Message (if you have MyChart) OR A paper copy in the mail If you have any lab test that is abnormal or we need to change your treatment, we will call you to review the results.  Testing/Procedures: none  Follow-Up: At [facility], you and your health needs are our priority.  As part of our continuing mission to provide you with exceptional heart care, our providers are all part of one team.  This team includes your primary Cardiologist (physician) and Advanced Practice Providers or APPs (Physician Assistants and Nurse Practitioners) who all work together to provide you with the care you need, when you need it.  Your next appointment:   3 months  Provider:   Dr. Kate  We recommend signing up for the patient portal called MyChart.  Sign up information is provided on this After Visit Summary.  MyChart is used to connect with patients for Virtual Visits (Telemedicine).  Patients are able to view lab/test results, encounter notes, upcoming appointments, etc.  Non-urgent messages can be sent to your provider as well.   To learn more about what you can do with MyChart, go to [email or web address].   Other Instructions Please increase hydration and as your provider recommendation please put compression stocking on in morning and take off at bedtime

## 2023-12-10 ENCOUNTER — Other Ambulatory Visit: Payer: Self-pay

## 2023-12-11 ENCOUNTER — Telehealth: Payer: Self-pay

## 2023-12-11 NOTE — Telephone Encounter (Signed)
 [facility] Patient Advocate Encounter  Patient's medications CABENUVA  have been couriered to [facility] from [facility] and will be administered at the patients appointment on 12/16/23.  Charmaine Sharps, CPhT Specialty Pharmacy Patient [facility] Phone: [phone] Fax:  [phone]

## 2023-12-15 NOTE — Progress Notes (Unsigned)
 HPI: Allen Espinoza is a 63 y.o. male who presents to the [facility] pharmacy clinic for Cabenuva  administration.  Patient Active Problem List   Diagnosis Date Noted   Stye 11/19/2023   Weight loss 10/14/2023   At high risk for cardiovascular disease 10/17/2022   Symptomatic bradycardia 10/17/2022   Subdural hematoma (HCC) 11/12/2021   Asthma    GERD (gastroesophageal reflux disease)    SDH (subdural hematoma) (HCC) 10/24/2021   Multiple lipomas 03/10/2021   Healthcare maintenance 10/13/2020   Numbness 09/28/2020   Subcutaneous nodules 03/01/2019   Hypertension 10/19/2018   Sleep disorder 03/05/2018   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) 08/19/2016   Morbid obesity (HCC) 08/19/2016   Solitary pulmonary nodule 05/06/2016   Hidradenitis suppurativa 04/27/2016   Chest pain 04/26/2016   Avascular necrosis of bone of hip, left s/p TRH (HCC) 03/19/2016   HIV (human immunodeficiency virus infection) (HCC) 03/18/2016   DVT (deep venous thrombosis) (HCC) 03/18/2016   Chronic kidney disease, stage 3a (HCC) 03/18/2016   Cholelithiasis 03/18/2016   Diverticulosis of colon without hemorrhage 03/18/2016    Patient's Medications  New Prescriptions   No medications on file  Previous Medications   ACETAMINOPHEN  (TYLENOL ) 500 MG TABLET    Take 1,000 mg by mouth daily as needed for moderate pain (pain score 4-6) or headache.   ADALIMUMAB  (HUMIRA , 2 PEN,) 40 MG/0.4ML PEN    Inject 40 mg into the skin every Thursday.   CABOTEGRAVIR  & RILPIVIRINE  ER (CABENUVA ) 600 & 900 MG/3ML INJECTION    Inject 1 kit into the muscle every 2 (two) months.   LATANOPROST (XALATAN) 0.005 % OPHTHALMIC SOLUTION    Place 1 drop into both eyes at bedtime.   METRONIDAZOLE  (FLAGYL ) 250 MG TABLET    Take 250 mg by mouth 3 (three) times daily.   MULTIPLE VITAMIN (MULTIVITAMIN WITH MINERALS) TABS TABLET    Take 1 tablet by mouth daily.   ROSUVASTATIN  (CRESTOR ) 10 MG TABLET    TAKE 1  TABLET BY MOUTH DAILY   TRIAMCINOLONE  OINTMENT (KENALOG ) 0.1 %    Apply 1 Application topically 2 (two) times daily.   XARELTO  20 MG TABS TABLET    Take 20 mg by mouth daily.  Modified Medications   No medications on file  Discontinued Medications   No medications on file    Allergies: Allergies  Allergen Reactions   Sulfa Antibiotics Other (See Comments)    Hypotension    Firvanq  [Vancomycin ] Itching    OK to take with Benadryl     Labs: Lab Results  Component Value Date   HIV1RNAQUANT 30 (H) 10/14/2023   HIV1RNAQUANT <20 (H) 04/21/2023   HIV1RNAQUANT Not Detected 10/17/2022   HIV1RNAVL 47,300 03/18/2016   CD4TABS 606 10/14/2023   CD4TABS 478 04/21/2023   CD4TABS 441 10/17/2022    RPR and STI Lab Results  Component Value Date   LABRPR NON-REACTIVE 04/21/2023   LABRPR NON-REACTIVE 04/25/2022   LABRPR NON-REACTIVE 06/21/2021   LABRPR NON-REACTIVE 02/01/2020   LABRPR NON-REACTIVE 08/18/2018    STI Results GC CT  02/13/2021  9:52 AM Negative  Negative   02/01/2020 11:01 AM Negative  Negative   03/03/2019 12:00 AM Negative  C Negative  C  05/01/2017 12:00 AM Negative  Negative   08/07/2016 12:00 AM Negative  Negative   04/26/2016 12:00 AM Negative  Negative     C Corrected result    Hepatitis B Lab Results  Component Value Date  HEPBSAB REACTIVE (A) 11/19/2016   HEPBSAG NON-REACTIVE 11/19/2016   Hepatitis C No results found for: HEPCAB, HCVRNAPCRQN Hepatitis A Lab Results  Component Value Date   HAV REACTIVE (A) 08/15/2022   Lipids: Lab Results  Component Value Date   CHOL 129 10/14/2023   TRIG 60 10/14/2023   HDL 71 10/14/2023   CHOLHDL 1.8 10/14/2023   VLDL 12 09/12/2023   LDLCALC 44 10/14/2023    TARGET DATE: The 20th  Assessment: Brayln presents today for his maintenance Cabenuva  injections. Past injections were tolerated well without issues. Last HIV RNA was 30 undetectable in July. He does not need to have HIV RNA assessed today,  will collect next in January. Doing well with no issues today.  Administered cabotegravir  600mg /86mL in left upper outer quadrant of the gluteal muscle. Administered rilpivirine  900 mg/3mL in the right upper outer quadrant of the gluteal muscle. No issues with injections. He will follow up in 2 months for next set of injections.  Discussed eligibility for the 2025-2026 flu vaccine. He accepts today.  Plan: - Cabenuva  injections administered - 2025-2026 flu vaccine administered in the right deltoid - Next injections scheduled for 02/12/24 with Alan - Call with any issues or questions  Izetta Carl, PharmD PGY1 Pharmacy Resident

## 2023-12-16 ENCOUNTER — Other Ambulatory Visit: Payer: Self-pay

## 2023-12-16 ENCOUNTER — Ambulatory Visit: Payer: Self-pay | Admitting: Pharmacist

## 2023-12-16 ENCOUNTER — Other Ambulatory Visit (HOSPITAL_COMMUNITY): Payer: Self-pay

## 2023-12-16 DIAGNOSIS — Z23 Encounter for immunization: Secondary | ICD-10-CM | POA: Diagnosis not present

## 2023-12-16 DIAGNOSIS — Z21 Asymptomatic human immunodeficiency virus [HIV] infection status: Secondary | ICD-10-CM | POA: Diagnosis not present

## 2023-12-16 DIAGNOSIS — Z113 Encounter for screening for infections with a predominantly sexual mode of transmission: Secondary | ICD-10-CM

## 2023-12-16 MED ORDER — CABOTEGRAVIR & RILPIVIRINE ER 600 & 900 MG/3ML IM SUER
1.0000 | Freq: Once | INTRAMUSCULAR | Status: AC
  Administered 2023-12-16: 1 via INTRAMUSCULAR

## 2023-12-17 NOTE — Unmapped (Addendum)
 I reviewed this patient case and all documentation provided by the learner and was readily available for consultation during their interaction with the patient.  I agree with the assessment and plan listed below.    [facility] Specialty Pharmacist      [facility] Clinical Assessment & Refill Coordination Note    [place], [place]: 04/21/1960  Phone: There are no phone numbers on file.    All above HIPAA information was verified with patient.     Was a Nurse, learning disability used for this call? No    Specialty Medication(s):   Inflammatory Disorders: Bimzelx      Current Medications[1]     Changes to medications: Dustin Prince reports no changes at this time.    Medication list has been reviewed and updated in Epic: Yes    Allergies[2]    Changes to allergies: No    Allergies have been reviewed and updated in Epic: Yes    SPECIALTY MEDICATION ADHERENCE     Bimzelx  320 mg: 1 doses of medicine on hand     Medication Adherence    Patient reported X missed doses in the last month: 0  Specialty Medication: Bimzelx   Support network for adherence: family member          Specialty medication(s) dose(s) confirmed: Regimen is correct and unchanged.     Are there any concerns with adherence? No    Adherence counseling provided? Not needed    CLINICAL MANAGEMENT AND INTERVENTION      Clinical Benefit Assessment:    Do you feel the medicine is effective or helping your condition? Yes    Clinical Benefit counseling provided? Not needed    Adverse Effects Assessment:    Are you experiencing any side effects? No    Are you experiencing difficulty administering your medicine? No    Quality of Life Assessment:    Quality of Life    Rheumatology  Oncology  Dermatology  1. What impact has your specialty medication had on the symptoms of your skin condition (i.e. itchiness, soreness, stinging)?: Minimal  2. What impact has your specialty medication had on your comfort level with your skin?: Minimal  Cystic Fibrosis          How many days over the past month did your HS  keep you from your normal activities? For example, brushing your teeth or getting up in the morning. 0    Have you discussed this with your provider? Not needed    Acute Infection Status:    Acute infections noted within Epic:  No active infections    Patient reported infection: None    Therapy Appropriateness:    Is therapy appropriate based on current medication list, adverse reactions, adherence, clinical benefit and progress toward achieving therapeutic goals? Yes, therapy is appropriate and should be continued     Clinical Intervention:    Was an intervention completed as part of this clinical assessment? No    DISEASE/MEDICATION-SPECIFIC INFORMATION      For patients on injectable medications: Next injection is scheduled for 9/18.    Chronic Inflammatory Diseases: Have you experienced any flares in the last month? No  Has this been reported to your provider? Not applicable    PATIENT SPECIFIC NEEDS     Does the patient have any physical, cognitive, or cultural barriers? No    Is the patient high risk? No    Does the patient require  physician intervention or other additional services (i.e., nutrition, smoking cessation, social work)? No    Does the patient have an additional or emergency contact listed in their chart? Yes    SOCIAL DETERMINANTS OF HEALTH     At the [facility], we have learned that life circumstances - like trouble affording food, housing, utilities, or transportation can affect the health of many of our patients.   That is why we wanted to ask: are you currently experiencing any life circumstances that are negatively impacting your health and/or quality of life? No    Social Drivers of Health     Food Insecurity: No Food Insecurity (09/12/2023)    Received from [facility]    Hunger Vital Sign     Within the past 12 months, you worried that your food would run out before you got the money to buy more.: Never true     Within the past 12 months, the food you bought just didn't last and you didn't have money to get more.: Never true   Tobacco Use: Low Risk  (10/31/2023)    Received from [facility]    Patient History     Smoking Tobacco Use: Never     Smokeless Tobacco Use: Never     Passive Exposure: Not on file   Transportation Needs: No Transportation Needs (09/12/2023)    Received from [facility] - Transportation     In the past 12 months, has lack of transportation kept you from medical appointments or from getting medications?: No     In the past 12 months, has lack of transportation kept you from meetings, work, or from getting things needed for daily living?: No   Alcohol Use: Not on file   Housing: Not on file   Physical Activity: Insufficiently Active (12/10/2017)    Received from [facility]    Exercise Vital Sign     Days of Exercise per Week: 2 days     Minutes of Exercise per Session: 10 min   Utilities: Not At Risk (09/12/2023)    Received from [facility] Utilities     In the past 12 months has the electric, gas, oil, or water company threatened to shut off services in your home?: No   Stress: No Stress Concern Present (12/10/2017)    Received from [facility] - Occupational Stress Questionnaire     Feeling of Stress : Only a little   Interpersonal Safety: Not on file   Substance Use: Not on file (02/04/2023)   Intimate Partner Violence: Not At Risk (09/12/2023)    Received from [facility]    Humiliation, Afraid, Rape, and Kick questionnaire     Within the last year, have you been afraid of your partner or ex-partner?: No     Within the last year, have you been humiliated or emotionally abused in other ways by your partner or ex-partner?: No     Within the last year, have you been kicked, hit, slapped, or otherwise physically hurt by your partner or ex-partner?: No     Within the last year, have you been raped or forced to have any kind of sexual activity by your partner or ex-partner?: No   Social Connections: Somewhat Isolated (12/10/2017)    Received from [facility]    Social Connection and Isolation Panel     Frequency of Communication with Friends and Family:  Three times a week     Frequency of Social Gatherings with Friends and Family: More than three times a week     Attends Religious Services: Never     Database administrator or Organizations: No     Attends Engineer, structural: Never     Marital Status: Living with partner   Physicist, medical Strain: Medium Risk (02/18/2022)    Received from [facility]    Overall Financial Resource Strain (CARDIA)     Difficulty of Paying Living Expenses: Somewhat hard   Health Literacy: Not on file   Internet Connectivity: Not on file       Would you be willing to receive help with any of the needs that you have identified today? Not applicable       SHIPPING     Specialty Medication(s) to be Shipped:   Inflammatory Disorders: Bimzelx     Other medication(s) to be shipped: No additional medications requested for fill at this time    Specialty Medications not needed at this time: N/A     Changes to insurance: Yes: New coverage added after eligibility check     Cost and Payment: Patient has a $0 copay, payment information is not required.    Delivery Scheduled: Patient declined refill at this time due to having 4 doses remaining. Will re-set next refill coordination date closer to when patient will need more medication.    The patient will receive a drug information handout for each medication shipped and additional FDA Medication Guides as required.  Verified that patient has previously received a Conservation officer, historic buildings and a Surveyor, mining.    The patient or caregiver noted above participated in the development of this care plan and knows that they can request review of or adjustments to the care plan at any time.      All of the patient's questions and concerns have been addressed.    [facility] Specialty Pharmacist          [1]   Current Outpatient Medications   Medication Sig Dispense Refill    acetaminophen  (TYLENOL ) 500 MG tablet Take 2 tablets (1,000 mg total) by mouth.      bictegrav-emtricit-tenofov ala (BIKTARVY) 50-200-25 mg tablet Take 1 tablet by mouth daily.      bimekizumab -bkzx (BIMZELX  AUTOINJECTOR) 320 mg/2 mL AtIn Inject the contents of 1 auto-injector (320mg ) under the skin at week 0, 2, 4, 6, 8, 10, 12, 14, 16 4 mL 4    bimekizumab -bkzx (BIMZELX  AUTOINJECTOR) 320 mg/2 mL AtIn Inject the contents of 1 auto-injector (320mg ) under the skin every 4 weeks for maintenance 2 mL 11    cabotegravir-rilpivirine (CABENUVA) 600 mg-900 mg/3 mL extended-release injection Inject 1 kit into the muscle.      empty container Misc Use as directed to dispose of Bimzelx  pens. 1 each 2    HUMIRA  PEN CITRATE FREE 40 MG/0.4 ML Inject the contents of 1 pen (40 mg total) under the skin once a week. (Patient not taking: Reported on 12/17/2023) 4 each 11    losartan  (COZAAR ) 50 MG tablet       methocarbamol (ROBAXIN) 750 MG tablet Take 1 tablet (750 mg total) by mouth.      metroNIDAZOLE  (FLAGYL ) 250 MG tablet TAKE 1 TABLET BY MOUTH 3 TIMES DAILY 90 tablet 10    pantoprazole (PROTONIX) 40 MG tablet       rivaroxaban (XARELTO) 20  mg tablet Take 20 mg by mouth daily with evening meal.      rosuvastatin (CRESTOR) 10 MG tablet Take 1 tablet (10 mg total) by mouth daily.      triamcinolone  (KENALOG ) 0.1 % ointment APPLY TOPICALLY TWICE DAILY TO AFFECTED AREAS AS NEEDED FOR DERMATITIS IN THE GLUTEAL CLEFT 454 g 10     No current facility-administered medications for this visit.   [2]   Allergies  Allergen Reactions    Sulfasalazine      Other reaction(s): Other (See Comments)  Low blood pressure    Vancomycin Itching     Give with benadryl

## 2023-12-28 NOTE — Unmapped (Signed)
 Assessment:  Hidradenitis, Hurley Stage 3  Significant improvement and decreased bother since starting bimekizumab  but still with draining lesions on right hemiscrotum    Discussion:  We had a long discussion about etiology, natural history and management options for hidradenitis. This is a chronic disease, and we not going to cure it with one surgery. However, we can identify the areas that are most bothersome and will treat those. Surgical techniques include incision and drainage of abscess, unroofing of fistula tracts, and resection of diseased areas. Following resection, reconstruction options include primary closure, tissue transfer with skin flaps or split thickness skin grafts (staged), and healing by secondary intention.    Plan:  Unroofing perineum, possible scrotum, possible resection. Overnight.  On xarelto for blood clots. Stops for surgery. Check with Lonni Nanas cardiology CE    Surgery Scheduling Checklist    Surgery (to be used for verify informed consent): Unroofing perineum, possible scrotum, possible resection, possible healing via secondary intention, possible closure  Surgery type: Next available  Case request ordered: Yes  Surgeon: Levonia    Pre-op: No  Pre-care (request for pre anesthesia testing): No  Blood thinners: Rivaroxaban (Xarelto). Message sent to Dr. Nanas.  Clearance: No  Labwork: CBC, BMP in POH[facility]  Admission status: Extended recovery  Case length: Hidradenitis - Small (120 with turnover)    Technique, risks, benefits and alternatives, and possibility of overlapping surgery were discussed.  The patient has had an opportunity to ask questions, and these questions were answered to satisfaction.  The patient expressed understanding.      HPI:   63 y.o. male     Initial visit 10/11/21  Hidradenitis, Tressia Stage 3. Most bothered by posterior scrotum/perineum.  HIV, well controlled  History of DVT, on Xarelto  Plan:   Excise posterior scrotum, perineum, possible unroofing of hidradenitis tracts  Will need clearance to come off Xarelto prior to scheduling - message sent via MyChart    03/10/23  Hidradenitis, Tressia Stage 3  Bothered by perineum, R groin, scrotum  Plan:   Would benefit from unroofing, possible resection of lesion in right scrotum  He is overall happy with medical management, not interested in surgery at this time but will let us  know if anything changes    11/17/23 Sayed  - Continue Humira  40mg  [place] weekly until Bimzelx  starts  -Start bimekizumab  320mg  every 2 weeks for 16 weeks then 320mg  every 4 weeks for maintenance.  Side effects including risk of thrush reviewed  - Continue metronidazole  250 mg TID. Counseled regarding risk of peripheral neuropathy with long-term use.  - Continue triamcinolone  0.1 % ointment; Apply twice daily to affected areas as needed for dermatitis in the gluteal cleft. We reviewed proper use and side effects of topical steroids including striae and cutaneous atrophy.   - Will send referral to general surgery to discuss surgery for the buttocks (~10x7cm).  Previously saw Dr. Levonia to discuss surgery on scrotum and deferred in 03/2023.    Interval history:  Started bimekuzumab 10/2023 with Sayed.   Feels like all lesions are calming down.   Reports one small active lesion in suprapubic area.   There were multiple small areas on scrotum and suprapubic area but these have improved significantly since starting bimekuzumab.   After exam, he endorses some drainage from the right hemiscrotum but this is decreasing     Past history reviewed and unchanged    ROS:   A comprehensive 10-system review was negative, except as noted in HPI.  The patient was asked to review all abnormal responses not pertinent to today's visit with their primary care physician.    BP 144/68  - Pulse 52  - Temp 35.9 ??C (96.6 ??F) (Temporal)  - Wt (!) 128.8 kg (284 lb)     Physical Exam:    General: well developed, well nourished, no acute distress  HEENT: PERLA, EOM intact, normocephalic, atraumatic  Neck: supple and no masses  Chest: symmetrical  Lungs: non-labored breathing  Heart: normal rhythm, no JVD  Abdomen: no tenderness, no masses or hernias, no palpable organomegaly  Extremities: no deformities, no edema, no cyanosis  GU: Uncircumcised. No penile lesions. Multiple sinus tracts on inferior scrotum, particularly on right side abutting inguinal crease. Some draining sites in this area. Additionally with multiple tracts on suprapubic area without active drainage, most bothersome is in the superior portion of the left inguinal fold.  The patient was offered a medical chaperone during the sensitive examination during this encounter. The patient declined to have a chaperone present during the sensitive exam.  --  See photos  Significant drainage/induration in perineum, ameanble to unroofing

## 2023-12-29 ENCOUNTER — Ambulatory Visit: Admit: 2023-12-29 | Discharge: 2023-12-30 | Payer: Medicare (Managed Care) | Attending: Urology | Primary: Urology

## 2023-12-30 NOTE — Unmapped (Signed)
 From:   Arvella Kays, MD [facility]  [facility] - Department of Urology  Phone Number:  [phone]  Fax Number: [phone]) Attn: [facility]    Re:   Patient name: Dustin Prince  Date of Birth: [birth year]  [facility] MRN: [id number]    ----    Dear Dr. Lonni Nanas     This patient is scheduled for genitourinary surgery, and I am requesting your assistance with pre-operative evaluation, management and risk stratification.    Date of surgery: TBD    Planned surgery: Hidradenitis surgery    Please provide the following:    Most recent clinic note, including all relevant diagnoses and medications  Most recent EKG  Most recent echocardiogram results  Most recent stress test results.    Please also respond below:    ___ Patient is cleared for proposed surgery.   His/her risk is considered to be  (circle one): LOW   INTERMEDIATE  HIGH    ___ No further cardiac testing is needed    ___ Patient is not cleared for proposed surgery, due to: ___________________    ___ Patient is taking the following medications and should hold   medication(s) ___ days prior to surgery:  Aspirin 325mg  (note: patients can/should stay on ASA 81mg )  Plavix  Ticlid  Coumadin  Brilinta  Pradaxa  Xarelto  Apixaban         PAGE 1/2  ___ Patient is taking the following medications and we cannot allow the patient to hold this/these medication(s) at this time.  Aspirin  Plavix  Ticlid  Coumadin  Brilinta  Pradaxa  Xarelto    ___ Patient needs bridging Lovenox/Heparin while off of Coumadin    ___ Patient should remain on the following medication(s) throughout the peri-operative period:  Beta Blocker  Nitrate     Additional information: _____________________________________    Provider name: ___________________________________________  Date/time: _______________________________________________    Please fax the above requested materials to:  [phone]   Attn: Urology Surgery Scheduler    Thank you for collaborating in the care of this patient. Please do not hesitate to contact me personally with any questions or concerns.    Sincerely,           Arvella Kays, MD [facility]  Professor (Urology & Plastic Surgery)  [employer] [facility]                          PAGE 2/2

## 2024-01-08 NOTE — Unmapped (Unsigned)
 [facility] - Clinical Pharmacist Intervention   Reason for Intervention Issue Observed Medication adherence          Additional Comments Technician reported to me that patient took all 6 pens weekly rather than every 2 weeks. I messaged with MD and contacted [employer] (results pending). MD advised OK to continue every 2 week dosing to finish load. I communicated with patient.   Intervention Type of Intervention Education performed  Provider contacted                  Recommendation provided, if applicable na, FYI - Provider recommended no hold, little risk of harm, continue every 2 weeks to finish load    Medication Medication(s) Involved Bimzelx     Outcome Expected Result/Outcome of Intervention Enhanced patient understanding  Improved patient safety  Improved medication adherence    Additional Comments      Follow-up Needed? No    Additional Follow-up Comments     Supporting Information Approximate Time Spent on Intervention >30 minutes    Clinical Evidence Used Clinical guidelines or Drug information resource  Chart review or clinical coordination            [facility] Clinical Assessment & Refill Coordination Note    Dustin Prince, DOB: [birth year]  Phone: There are no phone numbers on file.    All above HIPAA information was verified with patient.     Was a Nurse, learning disability used for this call? No    Specialty Medication(s):   Inflammatory Disorders: Bimzelx      Current Medications[1]     Changes to medications: Keyshaun reports no changes at this time.    Medication list has been reviewed and updated in Epic: Yes    Allergies[2]    Changes to allergies: No    Allergies have been reviewed and updated in Epic: Yes    SPECIALTY MEDICATION ADHERENCE     Bimzelx  320 mg: 0 doses of medicine on hand     Medication Adherence    Support network for adherence: family member          Specialty medication(s) dose(s) confirmed: Regimen is correct and unchanged.     Are there any concerns with adherence? Yes: patient was taking more often than directed- see intervention    Adherence counseling provided? Yes, see clinical intervention documentation    CLINICAL MANAGEMENT AND INTERVENTION      Clinical Benefit Assessment:    Do you feel the medicine is effective or helping your condition? Yes, somewhat    Clinical Benefit counseling provided? Reasonable expectations discussed: discussed may take more time for full benefit- patient reports improvement in some HS areas ad then new flaring in other areas.     Adverse Effects Assessment:    Are you experiencing any side effects? {Blank:19197::Yes, patient reports experiencing ***. See clinical intervention documentation: ***,No}    Are you experiencing difficulty administering your medicine? {Blank:19197::Yes, patient reports ***. See clinical intervention documentation,No}    Quality of Life Assessment:    Quality of Life    Rheumatology  Oncology  Dermatology  Cystic Fibrosis          {DiseaseSpecificQOL:73897}    Have you discussed this with your provider? {Blank:19197::Not needed,Yes,No - pharmacist will consult provider, see clinical intervention documentation}    Acute Infection Status:    Acute infections noted within Epic:  No active infections    Patient reported infection: {Blank single:19197::None,***- patient reported to  provider,***- pharmacy reported to provider, see clinical intervention documentation}    Therapy Appropriateness:    Is the medication and dose appropriate considering the patient???s diagnosis, treatment, and disease journey, comorbidities, medical history, current medications, allergies, therapeutic goals, self-administration ability, and access barriers? {Blank:19197::Yes, therapy is appropriate and should be continued,Pharmacist will consult provider - see clinical intervention documentation}     Clinical Intervention:    Was an intervention completed as part of this clinical assessment? {INTERVENTIONADDITION:117563}    DISEASE/MEDICATION-SPECIFIC INFORMATION      {clinicspecificinstructions:59274}    {DISEASESTATESPECIFICASSESSMENT:98894}    PATIENT SPECIFIC NEEDS     Does the patient have any physical, cognitive, or cultural barriers? {Blank single:19197::No,Yes - ***}    Is the patient high risk? {sschighriskpts:78327}    Does the patient require physician intervention or other additional services (i.e., nutrition, smoking cessation, social work)? {Blank single:19197::No,Yes, ***}    Does the patient have an additional or emergency contact listed in their chart? {Blank single:19197::Yes,No, patient refused.,***}    SOCIAL DETERMINANTS OF HEALTH     At the [facility], we have learned that life circumstances - like trouble affording food, housing, utilities, or transportation can affect the health of many of our patients.   That is why we wanted to ask: are you currently experiencing any life circumstances that are negatively impacting your health and/or quality of life? {YES/NO/PATIENTDECLINED:93004}    Social Drivers of Health     Food Insecurity: No Food Insecurity (09/12/2023)    Received from [facility]    Hunger Vital Sign     Within the past 12 months, you worried that your food would run out before you got the money to buy more.: Never true     Within the past 12 months, the food you bought just didn't last and you didn't have money to get more.: Never true   Tobacco Use: Low Risk  (12/29/2023)    Patient History     Smoking Tobacco Use: Never     Smokeless Tobacco Use: Never     Passive Exposure: Not on file   Transportation Needs: No Transportation Needs (09/12/2023)    Received from [facility] - Transportation     In the past 12 months, has lack of transportation kept you from medical appointments or from getting medications?: No     In the past 12 months, has lack of transportation kept you from meetings, work, or from getting things needed for daily living?: No   Alcohol Use: Not on file   Housing: Not on file   Physical Activity: Insufficiently Active (12/10/2017)    Received from [facility]    Exercise Vital Sign     Days of Exercise per Week: 2 days     Minutes of Exercise per Session: 10 min   Utilities: Not At Risk (09/12/2023)    Received from [facility] Utilities     In the past 12 months has the electric, gas, oil, or water company threatened to shut off services in your home?: No   Stress: No Stress Concern Present (12/10/2017)    Received from [facility] of Occupational Health - Occupational Stress Questionnaire     Feeling of Stress : Only a little   Interpersonal Safety: Not At Risk (12/29/2023)    Interpersonal Safety     Unsafe Where You Currently Live: No     Physically Hurt by Anyone: No  Abused by Anyone: No   Substance Use: Not on file (02/04/2023)   Intimate Partner Violence: Not At Risk (09/12/2023)    Received from [facility]    Humiliation, Afraid, Rape, and Kick questionnaire     Within the last year, have you been afraid of your partner or ex-partner?: No     Within the last year, have you been humiliated or emotionally abused in other ways by your partner or ex-partner?: No     Within the last year, have you been kicked, hit, slapped, or otherwise physically hurt by your partner or ex-partner?: No     Within the last year, have you been raped or forced to have any kind of sexual activity by your partner or ex-partner?: No   Social Connections: Somewhat Isolated (12/10/2017)    Received from [facility]    Social Connection and Isolation Panel     Frequency of Communication with Friends and Family: Three times a week     Frequency of Social Gatherings with Friends and Family: More than three times a week     Attends Religious Services: Never     Database administrator or Organizations: No     Attends Engineer, structural: Never     Marital Status: Living with partner   Physicist, medical Strain: Medium Risk (02/18/2022)    Received from [facility]    Overall Financial Resource Strain (CARDIA)     Difficulty of Paying Living Expenses: Somewhat hard   Health Literacy: Not on file   Internet Connectivity: Not on file       Would you be willing to receive help with any of the needs that you have identified today? {Yes/No/Not applicable:93005}       SHIPPING     Specialty Medication(s) to be Shipped:   {specpharm:59087}    Other medication(s) to be shipped: {Blank:19197::***,No additional medications requested for fill at this time}    Specialty Medications not needed at this time: {specpharm2:122626}     Changes to insurance: {Blank:19197::Yes: ***,No}    Cost and Payment: {Blank single:19197::Patient has a $0 copay, payment information is not required.,Patient has a copay of $***. They are aware and have authorized the pharmacy to charge the credit card on file.,Unable to determine copay at this time as the prescription requires a prior authorization/financial assistance. Patient is aware that shipment will be held until copay has been approved and payment information collected, if needed.}    Delivery Scheduled: {Blank:19197::Yes, Expected medication delivery date: ***.,Yes, Expected medication delivery date: ***.  However, Rx request for refills was sent to the provider as there are none remaining.,Patient declined refill at this time due to ***.,No, cannot schedule delivery at this time as there are outstanding items that need addressed.  This note has been handed off to the provider for follow up.,Due to patient insurance changes, unable to fill at [facility], please route Rx to *** specialty pharmacy}     Medication will be delivered via {Blank:19197::UPS,Next Day Courier,Same Day Courier,Clinic Courier - *** clinic,***} to the confirmed {Blank:19197::prescription,temporary} address in [facility]    The patient will receive a drug information handout for each medication shipped and additional FDA Medication Guides as required.  Verified that patient has previously received a Conservation officer, historic buildings and a Surveyor, mining.    The patient or caregiver noted above participated in the development of this care plan and knows that they can request review of or adjustments to the  care plan at any time.      All of the patient's questions and concerns have been addressed.    [facility]ont[facility] Specialty Pharmacist         [1]   Current Outpatient Medications   Medication Sig Dispense Refill    acetaminophen  (TYLENOL ) 500 MG tablet Take 2 tablets (1,000 mg total) by mouth.      bictegrav-emtricit-tenofov ala (BIKTARVY) 50-200-25 mg tablet Take 1 tablet by mouth daily. (Patient not taking: Reported on 12/29/2023)      bimekizumab -bkzx (BIMZELX  AUTOINJECTOR) 320 mg/2 mL AtIn Inject the contents of 1 auto-injector (320mg ) under the skin at week 0, 2, 4, 6, 8, 10, 12, 14, 16 4 mL 4    bimekizumab -bkzx (BIMZELX  AUTOINJECTOR) 320 mg/2 mL AtIn Inject the contents of 1 auto-injector (320mg ) under the skin every 4 weeks for maintenance 2 mL 11    cabotegravir-rilpivirine (CABENUVA) 600 mg-900 mg/3 mL extended-release injection Inject 1 kit into the muscle.      empty container Misc Use as directed to dispose of Bimzelx  pens. 1 each 2    losartan  (COZAAR ) 50 MG tablet       methocarbamol (ROBAXIN) 750 MG tablet Take 1 tablet (750 mg total) by mouth.      metroNIDAZOLE  (FLAGYL ) 250 MG tablet TAKE 1 TABLET BY MOUTH 3 TIMES DAILY (Patient not taking: Reported on 12/29/2023) 90 tablet 10    pantoprazole (PROTONIX) 40 MG tablet  (Patient not taking: Reported on 12/29/2023)      rivaroxaban (XARELTO) 20 mg tablet Take 20 mg by mouth daily with evening meal.      rosuvastatin (CRESTOR) 10 MG tablet Take 1 tablet (10 mg total) by mouth daily.      triamcinolone  (KENALOG ) 0.1 % ointment APPLY TOPICALLY TWICE DAILY TO AFFECTED AREAS AS NEEDED FOR DERMATITIS IN THE GLUTEAL CLEFT 454 g 10     No current facility-administered medications for this visit.   [2]   Allergies  Allergen Reactions    Sulfasalazine      Other reaction(s): Other (See Comments)  Low blood pressure    Vancomycin Itching     Give with benadryl

## 2024-01-08 NOTE — Unmapped (Signed)
 Voicemail Received    Message  Pt requested a return call regarding his medication.    Action  Nurse spoke with pt, states to disregard as he spoke with someone in the pharmacy.

## 2024-01-19 ENCOUNTER — Encounter (HOSPITAL_BASED_OUTPATIENT_CLINIC_OR_DEPARTMENT_OTHER): Admitting: Family Medicine

## 2024-01-19 MED FILL — BIMZELX AUTOINJECTOR 320 MG/2 ML SUBCUTANEOUS AUTO-INJECTOR: SUBCUTANEOUS | 28 days supply | Qty: 4 | Fill #1

## 2024-01-20 NOTE — Unmapped (Signed)
 [place] [facility] - Clinical Pharmacist Intervention   Reason for Intervention Issue Observed Medication adherence          Additional Comments Patient called to ask when he should take his next dose of Bimzelx .   Intervention Type of Intervention Education performed                  Recommendation provided, if applicable      Medication Medication(s) Involved Bimzelx    Outcome Expected Result/Outcome of Intervention Enhanced patient understanding  Improved medication adherence    Additional Comments Per patient, he took his last dose on 10/9. I told him that he should take his next dose on 10/23, then again on 11/6. I recommended that he mark those dates on his calendar or put them in his phone as a reminder.  I reinforced that he should not take doses every week.    Follow-up Needed? No    Additional Follow-up Comments     Supporting Information Approximate Time Spent on Intervention 0-10 minutes    Clinical Evidence Used Professional judgement  Chart review or clinical coordination      Therisa Ra, PharmD, CSP  [facility]  ph: [phone]  f: [phone]

## 2024-01-21 ENCOUNTER — Other Ambulatory Visit (HOSPITAL_COMMUNITY): Payer: Self-pay

## 2024-01-21 ENCOUNTER — Other Ambulatory Visit: Payer: Self-pay

## 2024-01-21 NOTE — Progress Notes (Signed)
 Specialty Pharmacy Refill Coordination Note  Allen Espinoza is a 63 y.o. male assessed today regarding refills of clinic administered specialty medication(s) Cabotegravir  & Rilpivirine  (CABENUVA )   Clinic requested Courier to Provider Office   Delivery date: 02/02/24   Verified address: [address] [place] [address]   Medication will be filled on 01/30/24.

## 2024-01-27 ENCOUNTER — Ambulatory Visit (INDEPENDENT_AMBULATORY_CARE_PROVIDER_SITE_OTHER): Admitting: Family Medicine

## 2024-01-27 ENCOUNTER — Encounter (HOSPITAL_BASED_OUTPATIENT_CLINIC_OR_DEPARTMENT_OTHER): Payer: Self-pay | Admitting: Family Medicine

## 2024-01-27 VITALS — BP 119/71 | HR 50 | Temp 97.8°F | Ht 72.0 in | Wt 278.0 lb

## 2024-01-27 DIAGNOSIS — Z0000 Encounter for general adult medical examination without abnormal findings: Secondary | ICD-10-CM

## 2024-01-27 NOTE — Progress Notes (Signed)
 Subjective:    CC: Annual Physical Exam  HPI: Allen Espinoza is a 63 y.o. presenting for annual physical  I reviewed the past medical history, family history, social history, surgical history, and allergies today and no changes were needed.  Please see the problem list section below in epic for further details.  Past Medical History: Past Medical History:  Diagnosis Date   Abscess    Between legs   Arthritis    all over   Asthma    Avascular necrosis of femoral head, left (HCC)    Blood transfusion without reported diagnosis    Cholelithiasis    CKD (chronic kidney disease) stage 3, GFR 30-59 ml/min (HCC)    sees kidney Dr.   Clotting disorder    left DVT   Diverticulosis    DVT (deep venous thrombosis) (HCC)    legs   Dyspnea    when walking   Dysrhythmia    remembers mother taking about having an irregular rhythm years when he was a child    GERD (gastroesophageal reflux disease)    HIV (human immunodeficiency virus infection) (HCC)    Hypertension    Morbid obesity (HCC)    Past Surgical History: Past Surgical History:  Procedure Laterality Date   COLONOSCOPY  2019   DENTAL SURGERY     had teeth pulled   HYDRADENITIS EXCISION Left 10/14/2016   Procedure: WIDE EXCISION HIDRADENITIS LEFT AXILLA;  Surgeon: Vernetta Berg, MD;  Location: [facility] OR;  Service: General;  Laterality: Left;   JOINT REPLACEMENT     Left hip Dr. Vernetta 11/29/16   TOTAL HIP ARTHROPLASTY Left 11/29/2016   Procedure: LEFT TOTAL HIP ARTHROPLASTY ANTERIOR APPROACH;  Surgeon: Vernetta Lonni GRADE, MD;  Location: [facility] ORS;  Service: Orthopedics;  Laterality: Left;   Social History: Social History   Socioeconomic History   Marital status: Single    Spouse name: Not on file   Number of children: 2   Years of education: Not on file   Highest education level: Not on file  Occupational History   Occupation: Disability  Tobacco Use   Smoking status: Never    Passive exposure: Never    Smokeless tobacco: Never  Vaping Use   Vaping status: Never Used  Substance and Sexual Activity   Alcohol use: No    Comment: prior   Drug use: No    Comment: prior cocaine use, last 2005   Sexual activity: Not Currently    Partners: Female    Birth control/protection: Condom  Other Topics Concern   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, mailroom, warehouse work   Lives w/ fiancee   No EtOH, tobacco, drugs   Social Drivers of Corporate Investment Banker Strain: Medium Risk (02/18/2022)   Overall Financial Resource Strain (CARDIA)    Difficulty of Paying Living Expenses: Somewhat hard  Food Insecurity: No Food Insecurity (09/12/2023)   Hunger Vital Sign    Worried About Running Out of Food in the Last Year: Never true    Ran Out of Food in the Last Year: Never true  Transportation Needs: No Transportation Needs (09/12/2023)   PRAPARE - Administrator, Civil Service (Medical): No    Lack of Transportation (Non-Medical): No  Physical Activity: Insufficiently Active (12/10/2017)   Exercise Vital Sign    Days of Exercise per Week: 2 days    Minutes of Exercise per Session: 10 min  Stress: No  Stress Concern Present (12/10/2017)   [employer] of Occupational Health - Occupational Stress Questionnaire    Feeling of Stress : Only a little  Social Connections: Somewhat Isolated (12/10/2017)   Social Connection and Isolation Panel    Frequency of Communication with Friends and Family: Three times a week    Frequency of Social Gatherings with Friends and Family: More than three times a week    Attends Religious Services: Never    Database Administrator or Organizations: No    Attends Engineer, Structural: Never    Marital Status: Living with partner   Family History: Family History  Problem Relation Age of Onset   Heart attack Mother 74   Heart attack Father 28   Prostate cancer Brother    Bone cancer Maternal Aunt     Breast cancer Maternal Aunt    Colon cancer Neg Hx    Esophageal cancer Neg Hx    Stomach cancer Neg Hx    Rectal cancer Neg Hx    Migraines Neg Hx    Headache Neg Hx    Allergies: Allergies  Allergen Reactions   Sulfa Antibiotics Other (See Comments)    Hypotension    Firvanq  [Vancomycin ] Itching    OK to take with Benadryl    Medications: See med rec.  Review of Systems: No headache, visual changes, nausea, vomiting, diarrhea, constipation, dizziness, abdominal pain, skin rash, fevers, chills, night sweats, swollen lymph nodes, weight loss, chest pain, body aches, joint swelling, muscle aches, shortness of breath, mood changes, visual or auditory hallucinations.  Objective:    BP 119/71 (BP Location: Left Arm, Patient Position: Sitting, Cuff Size: Normal)   Pulse (!) 50   Temp 97.8 F (36.6 C) (Oral)   Ht 6' (1.829 m)   Wt 278 lb (126.1 kg)   SpO2 100%   BMI 37.70 kg/m   General: Well Developed, well nourished, and in no acute distress. Neuro: Alert and oriented x3, extra-ocular muscles intact, sensation grossly intact. Cranial nerves II through XII are intact, motor, sensory, and coordinative functions are all intact. HEENT: Normocephalic, atraumatic, pupils equal round reactive to light, neck supple, no masses, no lymphadenopathy, thyroid  nonpalpable. Oropharynx, nasopharynx, external ear canals are unremarkable. Skin: Warm and dry, no rashes noted. Cardiac: Regular rate and rhythm, no murmurs rubs or gallops. Respiratory: Clear to auscultation bilaterally. Not using accessory muscles, speaking in full sentences. Abdominal: Soft, nontender, nondistended, positive bowel sounds, no masses, no organomegaly. Musculoskeletal: Shoulder, elbow, wrist, hip, knee, ankle stable, and with full range of motion.  Impression and Recommendations:    Wellness examination Assessment & Plan: Routine HCM labs ordered. HCM reviewed/discussed. Anticipatory guidance regarding healthy  weight, lifestyle and choices given. Recommend healthy diet.  Recommend approximately 150 minutes/week of moderate intensity exercise Recommend regular dental and vision exams Always use seatbelt/lap and shoulder restraints Recommend using smoke alarms and checking batteries at least twice a year Recommend using sunscreen when outside Discussed colon cancer screening recommendations, options.  Patient UTD Discussed immunization recommendations  Orders: -     TSH Rfx on Abnormal to Free T4 -     Lipid panel -     Hemoglobin A1c -     Comprehensive metabolic panel with GFR -     CBC with Differential/Platelet  Patient also has intermittent issues with chest pain.  He notes that this has been going on for several months, since at least the beginning of the year, likely longer.  He does follow  with cardiology.  He was arranged for stress test a few months ago, however had orthostatic hypotension at the time and was not able to have stress test completed.  He has been working with his cardiologist to address this issue so that they can reschedule stress test.  His next appointment with cardiology is in about 1 month.  Generally, his current symptoms are unchanged from prior symptoms.  Given this, recommend continued follow-up with cardiology as scheduled.  Discussed importance of eventually having stress test completed for further assessment.  Return in about 4 months (around 05/29/2024).   ___________________________________________ Aidel Dav[place], MD, ABFM, CAQSM Primary Care and Sports Medicine [facility]

## 2024-01-27 NOTE — Assessment & Plan Note (Signed)
 Routine HCM labs ordered. HCM reviewed/discussed. Anticipatory guidance regarding healthy weight, lifestyle and choices given. Recommend healthy diet.  Recommend approximately 150 minutes/week of moderate intensity exercise Recommend regular dental and vision exams Always use seatbelt/lap and shoulder restraints Recommend using smoke alarms and checking batteries at least twice a year Recommend using sunscreen when outside Discussed colon cancer screening recommendations, options.  Patient UTD Discussed immunization recommendations

## 2024-01-28 ENCOUNTER — Ambulatory Visit (HOSPITAL_BASED_OUTPATIENT_CLINIC_OR_DEPARTMENT_OTHER): Payer: Self-pay | Admitting: Family Medicine

## 2024-01-28 LAB — CBC WITH DIFFERENTIAL/PLATELET
Basophils Absolute: 0 x10E3/uL (ref 0.0–0.2)
Basos: 0 %
EOS (ABSOLUTE): 0.1 x10E3/uL (ref 0.0–0.4)
Eos: 1 %
Hematocrit: 44.7 % (ref 37.5–51.0)
Hemoglobin: 14.6 g/dL (ref 13.0–17.7)
Immature Grans (Abs): 0 x10E3/uL (ref 0.0–0.1)
Immature Granulocytes: 0 %
Lymphocytes Absolute: 1.5 x10E3/uL (ref 0.7–3.1)
Lymphs: 22 %
MCH: 32.3 pg (ref 26.6–33.0)
MCHC: 32.7 g/dL (ref 31.5–35.7)
MCV: 99 fL — ABNORMAL HIGH (ref 79–97)
Monocytes Absolute: 0.5 x10E3/uL (ref 0.1–0.9)
Monocytes: 8 %
Neutrophils Absolute: 4.8 x10E3/uL (ref 1.4–7.0)
Neutrophils: 69 %
Platelets: 213 x10E3/uL (ref 150–450)
RBC: 4.52 x10E6/uL (ref 4.14–5.80)
RDW: 12 % (ref 11.6–15.4)
WBC: 7 x10E3/uL (ref 3.4–10.8)

## 2024-01-28 LAB — LIPID PANEL
Chol/HDL Ratio: 1.9 ratio (ref 0.0–5.0)
Cholesterol, Total: 128 mg/dL (ref 100–199)
HDL: 68 mg/dL (ref >39)
LDL Chol Calc (NIH): 47 mg/dL (ref 0–99)
Triglycerides: 59 mg/dL (ref 0–149)
VLDL Cholesterol Cal: 13 mg/dL (ref 5–40)

## 2024-01-28 LAB — COMPREHENSIVE METABOLIC PANEL WITH GFR
ALT: 8 IU/L (ref 0–44)
AST: 16 IU/L (ref 0–40)
Albumin: 3.8 g/dL — ABNORMAL LOW (ref 3.9–4.9)
Alkaline Phosphatase: 86 IU/L (ref 47–123)
BUN/Creatinine Ratio: 8 — ABNORMAL LOW (ref 10–24)
BUN: 10 mg/dL (ref 8–27)
Bilirubin Total: 0.5 mg/dL (ref 0.0–1.2)
CO2: 22 mmol/L (ref 20–29)
Calcium: 9.3 mg/dL (ref 8.6–10.2)
Chloride: 104 mmol/L (ref 96–106)
Creatinine, Ser: 1.23 mg/dL (ref 0.76–1.27)
Globulin, Total: 4.4 g/dL (ref 1.5–4.5)
Glucose: 108 mg/dL — ABNORMAL HIGH (ref 70–99)
Potassium: 3.8 mmol/L (ref 3.5–5.2)
Sodium: 141 mmol/L (ref 134–144)
Total Protein: 8.2 g/dL (ref 6.0–8.5)
eGFR: 65 mL/min/1.73 (ref >59)

## 2024-01-28 LAB — HEMOGLOBIN A1C
Est. average glucose Bld gHb Est-mCnc: 114 mg/dL
Hgb A1c MFr Bld: 5.6 % (ref 4.8–5.6)

## 2024-01-28 LAB — TSH RFX ON ABNORMAL TO FREE T4: TSH: 1.36 u[IU]/mL (ref 0.450–4.500)

## 2024-01-28 NOTE — Telephone Encounter (Signed)
 Called and left VM for patient to return my call.

## 2024-01-30 ENCOUNTER — Other Ambulatory Visit: Payer: Self-pay

## 2024-02-02 ENCOUNTER — Telehealth: Payer: Self-pay

## 2024-02-02 NOTE — Telephone Encounter (Signed)
 [facility] Patient Advocate Encounter  Patient's medications Cabenuva  have been couriered to [facility] from [facility] and will be administered at the patients appointment on 02/12/24.  Arland Hutchinson, CPhT Specialty Pharmacy Patient [facility] Phone: [phone] Fax:  [phone]

## 2024-02-10 DIAGNOSIS — L732 Hidradenitis suppurativa: Principal | ICD-10-CM

## 2024-02-10 NOTE — Progress Notes (Signed)
 [facility] Clinical Assessment & Refill Coordination Note    Dustin Prince, Dustin Prince: 13-Nov-1960  Phone: There are no phone numbers on file.    All above HIPAA information was verified with patient.     Was a nurse, learning disability used for this call? No    Specialty Medication(s):   Inflammatory Disorders: Bimzelx      Current Medications[1]     Changes to medications: Abubakr reports no changes at this time.    Medication list has been reviewed and updated in Epic: Yes    Allergies[2]    Changes to allergies: No    Allergies have been reviewed and updated in Epic: Yes    SPECIALTY MEDICATION ADHERENCE     Bimzelx  320 mg: 0 doses of medicine on hand     Medication Adherence    Support network for adherence: family member          Specialty medication(s) dose(s) confirmed: reviewed with patient this is last loading - will move to 1/month moving forward     Are there any concerns with adherence? No    Adherence counseling provided? Not needed    CLINICAL MANAGEMENT AND INTERVENTION      Clinical Benefit Assessment:    Do you feel the medicine is effective or helping your condition? Yes, somewhat - although patient thinks prior Humira  therapy maybe helped more    Clinical Benefit counseling provided? Deferred - patient with followup in Jan    Adverse Effects Assessment:    Are you experiencing any side effects? No    Are you experiencing difficulty administering your medicine? No    Quality of Life Assessment:    Quality of Life    Rheumatology  Oncology  Dermatology  Cystic Fibrosis          How many days over the past month did your HS  keep you from your normal activities? For example, brushing your teeth or getting up in the morning. Patient declined to answer    Have you discussed this with your provider? Not needed    Acute Infection Status:    Acute infections noted within Epic:  No active infections    Patient reported infection: None    Therapy Appropriateness:    Is the medication and dose appropriate considering the patient???s diagnosis, treatment, and disease journey, comorbidities, medical history, current medications, allergies, therapeutic goals, self-administration ability, and access barriers? Yes, therapy is appropriate and should be continued     Clinical Intervention:    Was an intervention completed as part of this clinical assessment? No    DISEASE/MEDICATION-SPECIFIC INFORMATION      For patients on injectable medications: Next injection is scheduled for 11/20.    Chronic Inflammatory Diseases: Have you experienced any flares in the last month? Yes, some flaring  Has this been reported to your provider? No    PATIENT SPECIFIC NEEDS     Does the patient have any physical, cognitive, or cultural barriers? No    Is the patient high risk? No    Does the patient require physician intervention or other additional services (i.e., nutrition, smoking cessation, social work)? No    Does the patient have an additional or emergency contact listed in their chart? Yes    SOCIAL DETERMINANTS OF HEALTH     At the [facility], we have learned that life circumstances - like trouble affording food, housing, utilities, or transportation can affect the health of many of our patients.  That is why we wanted to ask: are you currently experiencing any life circumstances that are negatively impacting your health and/or quality of life? Patient declined to answer    Social Drivers of Health     Food Insecurity: No Food Insecurity (09/12/2023)    Received from [facility]    Hunger Vital Sign     Within the past 12 months, you worried that your food would run out before you got the money to buy more.: Never true     Within the past 12 months, the food you bought just didn't last and you didn't have money to get more.: Never true   Tobacco Use: Low Risk (12/29/2023)    Patient History     Smoking Tobacco Use: Never     Smokeless Tobacco Use: Never     Passive Exposure: Not on file   Transportation Needs: No Transportation Needs (09/12/2023)    Received from [facility] - Transportation     In the past 12 months, has lack of transportation kept you from medical appointments or from getting medications?: No     In the past 12 months, has lack of transportation kept you from meetings, work, or from getting things needed for daily living?: No   Alcohol Use: Not on file   Housing: Not on file   Physical Activity: Not on file   Utilities: Not At Risk (09/12/2023)    Received from [facility] Utilities     In the past 12 months has the electric, gas, oil, or water company threatened to shut off services in your home?: No   Stress: Not on file   Interpersonal Safety: Not At Risk (12/29/2023)    Interpersonal Safety     Unsafe Where You Currently Live: No     Physically Hurt by Anyone: No     Abused by Anyone: No   Substance Use: Not on file (02/04/2023)   Intimate Partner Violence: Not At Risk (09/12/2023)    Received from [facility]    Humiliation, Afraid, Rape, and Kick questionnaire     Within the last year, have you been afraid of your partner or ex-partner?: No     Within the last year, have you been humiliated or emotionally abused in other ways by your partner or ex-partner?: No     Within the last year, have you been kicked, hit, slapped, or otherwise physically hurt by your partner or ex-partner?: No     Within the last year, have you been raped or forced to have any kind of sexual activity by your partner or ex-partner?: No   Social Connections: Not on file   Financial Resource Strain: Medium Risk (02/18/2022)    Received from [facility]    Overall Financial Resource Strain (CARDIA)     Difficulty of Paying Living Expenses: Somewhat hard   Health Literacy: Not on file   Internet Connectivity: Not on file       Would you be willing to receive help with any of the needs that you have identified today? Not applicable       SHIPPING     Specialty Medication(s) to be Shipped:   Inflammatory Disorders: Bimzelx     Other medication(s) to be shipped: No additional medications requested for fill at this time    Specialty Medications not needed at this time: N/A     Changes to insurance: No    Cost and Payment: Patient  has a $0 copay, payment information is not required.    Delivery Scheduled: Yes, Expected medication delivery date: 11/18 - pending PA with new plan.     Medication will be delivered via UPS to the confirmed prescription address in [facility].    The patient will receive a drug information handout for each medication shipped and additional FDA Medication Guides as required.  Verified that patient has previously received a Conservation Officer, Historic Buildings and a Surveyor, Mining.    The patient or caregiver noted above participated in the development of this care plan and knows that they can request review of or adjustments to the care plan at any time.      All of the patient's questions and concerns have been addressed.    Luke Falero A [facility] Specialty Pharmacist         [1]   Current Outpatient Medications   Medication Sig Dispense Refill    acetaminophen  (TYLENOL ) 500 MG tablet Take 2 tablets (1,000 mg total) by mouth.      bictegrav-emtricit-tenofov ala (BIKTARVY) 50-200-25 mg tablet Take 1 tablet by mouth daily. (Patient not taking: Reported on 12/29/2023)      bimekizumab -bkzx (BIMZELX  AUTOINJECTOR) 320 mg/2 mL AtIn Inject the contents of 1 auto-injector (320mg ) under the skin at week 0, 2, 4, 6, 8, 10, 12, 14, 16 4 mL 4    bimekizumab -bkzx (BIMZELX  AUTOINJECTOR) 320 mg/2 mL AtIn Inject the contents of 1 auto-injector (320mg ) under the skin every 4 weeks for maintenance 2 mL 11    cabotegravir-rilpivirine (CABENUVA) 600 mg-900 mg/3 mL extended-release injection Inject 1 kit into the muscle.      empty container Misc Use as directed to dispose of Bimzelx  pens. 1 each 2    losartan  (COZAAR ) 50 MG tablet       methocarbamol (ROBAXIN) 750 MG tablet Take 1 tablet (750 mg total) by mouth.      metroNIDAZOLE  (FLAGYL ) 250 MG tablet TAKE 1 TABLET BY MOUTH 3 TIMES DAILY (Patient not taking: Reported on 12/29/2023) 90 tablet 10    pantoprazole (PROTONIX) 40 MG tablet  (Patient not taking: Reported on 12/29/2023)      rivaroxaban (XARELTO) 20 mg tablet Take 20 mg by mouth daily with evening meal.      rosuvastatin (CRESTOR) 10 MG tablet Take 1 tablet (10 mg total) by mouth daily.      triamcinolone  (KENALOG ) 0.1 % ointment APPLY TOPICALLY TWICE DAILY TO AFFECTED AREAS AS NEEDED FOR DERMATITIS IN THE GLUTEAL CLEFT 454 g 10     No current facility-administered medications for this visit.   [2]   Allergies  Allergen Reactions    Sulfasalazine      Other reaction(s): Other (See Comments)  Low blood pressure    Vancomycin Itching     Give with benadryl

## 2024-02-11 NOTE — Progress Notes (Signed)
 HPI: Allen Espinoza is a 63 y.o. male who presents to the [facility] pharmacy clinic for Cabenuva  administration.  Referring ID Physician: Cathlyn July, NP   Patient Active Problem List   Diagnosis Date Noted   Stye 11/19/2023   Weight loss 10/14/2023   At high risk for cardiovascular disease 10/17/2022   Symptomatic bradycardia 10/17/2022   Subdural hematoma (HCC) 11/12/2021   Asthma    GERD (gastroesophageal reflux disease)    SDH (subdural hematoma) (HCC) 10/24/2021   Multiple lipomas 03/10/2021   Wellness examination 10/13/2020   Numbness 09/28/2020   Subcutaneous nodules 03/01/2019   Hypertension 10/19/2018   Sleep disorder 03/05/2018   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) 08/19/2016   Morbid obesity (HCC) 08/19/2016   Solitary pulmonary nodule 05/06/2016   Hidradenitis suppurativa 04/27/2016   Chest pain 04/26/2016   Avascular necrosis of bone of hip, left s/p TRH (HCC) 03/19/2016   HIV (human immunodeficiency virus infection) (HCC) 03/18/2016   DVT (deep venous thrombosis) (HCC) 03/18/2016   Chronic kidney disease, stage 3a (HCC) 03/18/2016   Cholelithiasis 03/18/2016   Diverticulosis of colon without hemorrhage 03/18/2016    Patient's Medications  New Prescriptions   No medications on file  Previous Medications   ACETAMINOPHEN  (TYLENOL ) 500 MG TABLET    Take 1,000 mg by mouth daily as needed for moderate pain (pain score 4-6) or headache.   ADALIMUMAB  (HUMIRA , 2 PEN,) 40 MG/0.4ML PEN    Inject 40 mg into the skin every Thursday.   BIMZELX 320 MG/2ML PEN    Inject 320 mg into the skin every 14 (fourteen) days.   CABOTEGRAVIR  & RILPIVIRINE  ER (CABENUVA ) 600 & 900 MG/3ML INJECTION    Inject 1 kit into the muscle every 2 (two) months.   LATANOPROST (XALATAN) 0.005 % OPHTHALMIC SOLUTION    Place 1 drop into both eyes at bedtime.   METRONIDAZOLE  (FLAGYL ) 250 MG TABLET    Take 250 mg by mouth 3 (three) times daily.   MULTIPLE VITAMIN  (MULTIVITAMIN WITH MINERALS) TABS TABLET    Take 1 tablet by mouth daily.   ROSUVASTATIN  (CRESTOR ) 10 MG TABLET    TAKE 1 TABLET BY MOUTH DAILY   TRIAMCINOLONE  OINTMENT (KENALOG ) 0.1 %    Apply 1 Application topically 2 (two) times daily.   XARELTO  20 MG TABS TABLET    Take 20 mg by mouth daily.  Modified Medications   No medications on file  Discontinued Medications   No medications on file    Allergies: Allergies  Allergen Reactions   Sulfa Antibiotics Other (See Comments)    Hypotension    Firvanq  [Vancomycin ] Itching    OK to take with Benadryl     Past Medical History: Past Medical History:  Diagnosis Date   Abscess    Between legs   Arthritis    all over   Asthma    Avascular necrosis of femoral head, left (HCC)    Blood transfusion without reported diagnosis    Cholelithiasis    CKD (chronic kidney disease) stage 3, GFR 30-59 ml/min (HCC)    sees kidney Dr.   Clotting disorder    left DVT   Diverticulosis    DVT (deep venous thrombosis) (HCC)    legs   Dyspnea    when walking   Dysrhythmia    remembers mother taking about having an irregular rhythm years when he was a child    GERD (gastroesophageal reflux disease)  HIV (human immunodeficiency virus infection) (HCC)    Hypertension    Morbid obesity (HCC)     Social History: Social History   Socioeconomic History   Marital status: Single    Spouse name: Not on file   Number of children: 2   Years of education: Not on file   Highest education level: Not on file  Occupational History   Occupation: Disability  Tobacco Use   Smoking status: Never    Passive exposure: Never   Smokeless tobacco: Never  Vaping Use   Vaping status: Never Used  Substance and Sexual Activity   Alcohol use: No    Comment: prior   Drug use: No    Comment: prior cocaine use, last 2005   Sexual activity: Not Currently    Partners: Female    Birth control/protection: Condom  Other Topics Concern   Not on file  Social  History Narrative   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, mailroom, warehouse work   Lives w/ fiancee   No EtOH, tobacco, drugs   Social Drivers of Corporate Investment Banker Strain: Medium Risk (02/18/2022)   Overall Financial Resource Strain (CARDIA)    Difficulty of Paying Living Expenses: Somewhat hard  Food Insecurity: No Food Insecurity (09/12/2023)   Hunger Vital Sign    Worried About Running Out of Food in the Last Year: Never true    Ran Out of Food in the Last Year: Never true  Transportation Needs: No Transportation Needs (09/12/2023)   PRAPARE - Administrator, Civil Service (Medical): No    Lack of Transportation (Non-Medical): No  Physical Activity: Insufficiently Active (12/10/2017)   Exercise Vital Sign    Days of Exercise per Week: 2 days    Minutes of Exercise per Session: 10 min  Stress: No Stress Concern Present (12/10/2017)   [employer] of Occupational Health - Occupational Stress Questionnaire    Feeling of Stress : Only a little  Social Connections: Somewhat Isolated (12/10/2017)   Social Connection and Isolation Panel    Frequency of Communication with Friends and Family: Three times a week    Frequency of Social Gatherings with Friends and Family: More than three times a week    Attends Religious Services: Never    Database Administrator or Organizations: No    Attends Banker Meetings: Never    Marital Status: Living with partner    Labs: Lab Results  Component Value Date   HIV1RNAQUANT 30 (H) 10/14/2023   HIV1RNAQUANT <20 (H) 04/21/2023   HIV1RNAQUANT Not Detected 10/17/2022   HIV1RNAVL 47,300 03/18/2016   CD4TABS 606 10/14/2023   CD4TABS 478 04/21/2023   CD4TABS 441 10/17/2022    RPR and STI Lab Results  Component Value Date   LABRPR NON-REACTIVE 04/21/2023   LABRPR NON-REACTIVE 04/25/2022   LABRPR NON-REACTIVE 06/21/2021   LABRPR NON-REACTIVE 02/01/2020   LABRPR NON-REACTIVE 08/18/2018    STI Results GC CT   02/13/2021  9:52 AM Negative  Negative   02/01/2020 11:01 AM Negative  Negative   03/03/2019 12:00 AM Negative  C Negative  C  05/01/2017 12:00 AM Negative  Negative   08/07/2016 12:00 AM Negative  Negative   04/26/2016 12:00 AM Negative  Negative     C Corrected result    Hepatitis B Lab Results  Component Value Date   HEPBSAB REACTIVE (A) 11/19/2016   HEPBSAG NON-REACTIVE 11/19/2016   Hepatitis C No results found for: HEPCAB, HCVRNAPCRQN Hepatitis A Lab  Results  Component Value Date   HAV REACTIVE (A) 08/15/2022   Lipids: Lab Results  Component Value Date   CHOL 128 01/27/2024   TRIG 59 01/27/2024   HDL 68 01/27/2024   CHOLHDL 1.9 01/27/2024   VLDL 12 09/12/2023   LDLCALC 47 01/27/2024    TARGET DATE:  The 20th of the month  Assessment: Issachar presents today for their maintenance Cabenuva  injections. Initial/past injections were tolerated well without issues. No problems with systemic effects of injections.   Administered cabotegravir  600mg /64mL in left upper outer quadrant of the gluteal muscle. Administered rilpivirine  900 mg/3mL in the right upper outer quadrant of the gluteal muscle. Monitored patient for 10 minutes after injection. Injections were tolerated well without issue. Patient will follow up in 2 months for next injection. Will check HIV RNA at next visit with Cathlyn. Currently up-to-date on vaccines.   Plan: - Administer Cabenuva  injections  - Next injections scheduled for 1/19 with Cathlyn and 3/13 with me  - Call with any issues or questions  Alan Geralds, PharmD, CPP, BCIDP, AAHIVP Clinical Pharmacist Practitioner Infectious Diseases Clinical Pharmacist [facility]

## 2024-02-11 NOTE — Progress Notes (Deleted)
 SABRA

## 2024-02-12 ENCOUNTER — Other Ambulatory Visit: Payer: Self-pay

## 2024-02-12 ENCOUNTER — Ambulatory Visit: Admitting: Pharmacist

## 2024-02-12 DIAGNOSIS — Z21 Asymptomatic human immunodeficiency virus [HIV] infection status: Secondary | ICD-10-CM

## 2024-02-12 MED ORDER — CABOTEGRAVIR & RILPIVIRINE ER 600 & 900 MG/3ML IM SUER
1.0000 | Freq: Once | INTRAMUSCULAR | Status: AC
  Administered 2024-02-12: 1 via INTRAMUSCULAR

## 2024-02-13 DIAGNOSIS — L732 Hidradenitis suppurativa: Principal | ICD-10-CM

## 2024-02-13 MED ORDER — SECUKINUMAB 300 MG/2 ML SUBCUTANEOUS PEN INJECTOR
SUBCUTANEOUS | 11 refills | 0.00000 days | Status: CP
Start: 2024-02-13 — End: ?

## 2024-02-13 NOTE — Telephone Encounter (Signed)
 Tried calling patient and VM was full and could not leave a message.

## 2024-02-16 DIAGNOSIS — L732 Hidradenitis suppurativa: Principal | ICD-10-CM

## 2024-02-16 MED ORDER — HUMIRA(CF) PEN 80 MG/0.8 ML SUBCUTANEOUS KIT
SUBCUTANEOUS | 11 refills | 0.00000 days | Status: CP
Start: 2024-02-16 — End: ?

## 2024-02-16 MED ORDER — ADALIMUMAB 80 MG/0.8 ML SUBCUTANEOUS PEN KIT
PACK | SUBCUTANEOUS | 0 refills | 0.00000 days | Status: CP
Start: 2024-02-16 — End: ?
  Filled 2024-03-15: qty 2, 14d supply, fill #0

## 2024-02-16 NOTE — Progress Notes (Signed)
 Dustin Prince 's BIMZELX  AUTOINJECTOR 320 mg/2 mL Atin (bimekizumab -bkzx) shipment will be canceled as a result of prior authorization being required by the patient's insurance. (PA denied)    I have reached out to the patient  at [phone] and communicated the delay. We will not reschedule the medication and have removed this/these medication(s) from the work request.  We have not confirmed the new delivery date.

## 2024-02-16 NOTE — Progress Notes (Signed)
 I spoke with patient - we'll plan to send Bimzelx  dose as planned for 11/18 delivery since insurance allows transition fill (PA denied, must try Cosentyx first).    Patient is concerned he is flaring more with Bimzelx  and would like to consider going back to Humira .    I offered to message provider.    Ines Rebel A. Claudene, PharmD, BCPS - Clinical Pharmacist   [facility]    [address], [place], [place]  [address]  t [phone], opt 4 then 2 - f [phone]

## 2024-02-17 MED FILL — BIMZELX AUTOINJECTOR 320 MG/2 ML SUBCUTANEOUS AUTO-INJECTOR: SUBCUTANEOUS | 28 days supply | Qty: 2 | Fill #2

## 2024-02-18 DIAGNOSIS — L732 Hidradenitis suppurativa: Principal | ICD-10-CM

## 2024-02-18 NOTE — Progress Notes (Signed)
 [facility] Specialty Medication Onboarding    Specialty Medication: HUMIRA (CF) PEN 80 mg/0.8 mL Pnkt (adalimumab )  Prior Authorization: Referral pending  Financial Assistance: No - copay  <$25  Copay/Day Supply: ($0 / 14 LD) ($0 / 28 MD)    Insurance Restrictions: Yes - Transition Fill (referral entered)    Notes to Pharmacist:   Credit Card on File: not applicable  Start Date on Rx:  02/16/24  Delivery Method (based on home address currently on file): UPS no restrictions      The triage team has completed the benefits investigation and has determined that the patient is able to fill this medication at [facility]. Please contact the patient to complete the onboarding or follow up with the prescribing physician as needed.

## 2024-02-23 NOTE — Progress Notes (Signed)
 [facility] Specialty Medication Onboarding    Specialty Medication: Humira   Prior Authorization: Approved   Financial Assistance: No - copay  <$25  Copay/Day Supply: $0 / 14 days (LD) & 28 days (MD)    Insurance Restrictions: Yes - max 1 month supply     Notes to Pharmacist:   Credit Card on File: not applicable  Start Date on Rx:    Delivery Method (based on home address currently on file): UPS no restrictions      The triage team has completed the benefits investigation and has determined that the patient is able to fill this medication at [facility]. Please contact the patient to complete the onboarding or follow up with the prescribing physician as needed.

## 2024-02-29 NOTE — Progress Notes (Unsigned)
 Cardio.orthostatic logy Office Note:    Date:  03/04/2024   ID:  Allen Espinoza, DOB 08/05/1960, MRN [id number]  PCP:  de Cuba, Raymond J, MD  Cardiologist:  None  Electrophysiologist:  None   Referring MD: de Cuba, Quintin PARAS, MD   Chief Complaint  Patient presents with   orthostatic hypotension    History of Present Illness:    Allen Espinoza is a 63 y.o. male with a hx of CKD stage III, DVT on Xarelto , HIV, asthma who presents for follow-up.  He was seen as a consult by Dr. Alveta 08/2023.  He had presented to the ED with chest pain and shortness of breath.  EKG without ischemic changes troponin negative.  Outpatient coronary CTA was recommended.  Echocardiogram 09/12/2023 showed normal biventricular function, no significant valvular disease.  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  Stress test was recommended to evaluate for ischemia but prior to treadmill test was noted to be orthostatic and sent to ED.  He had been on as needed Lasix  but was taking every day.  He received IV fluids in the ED and was told to just take Lasix  as needed.  Zio patch x 7 days 10/2023 showed 1 episode of SVT lasting 11 beats, frequent PVCs (6.7% of beats).  Since last clinic visit, he reports he is doing okay.  Reports some lightheadedness but has improved, denies any syncope.  Still with some dyspnea and chest pain.  He has not been exercising.   Past Medical History:  Diagnosis Date   Abscess    Between legs   Arthritis    all over   Asthma    Avascular necrosis of femoral head, left (HCC)    Blood transfusion without reported diagnosis    Cholelithiasis    CKD (chronic kidney disease) stage 3, GFR 30-59 ml/min (HCC)    sees kidney Dr.   Clotting disorder    left DVT   Diverticulosis    DVT (deep venous thrombosis) (HCC)    legs   Dyspnea    when  walking   Dysrhythmia    remembers mother taking about having an irregular rhythm years when he was a child    GERD (gastroesophageal reflux disease)    HIV (human immunodeficiency virus infection) (HCC)    Hypertension    Morbid obesity (HCC)     Past Surgical History:  Procedure Laterality Date   COLONOSCOPY  2019   DENTAL SURGERY     had teeth pulled   HYDRADENITIS EXCISION Left 10/14/2016   Procedure: WIDE EXCISION HIDRADENITIS LEFT AXILLA;  Surgeon: Vernetta Berg, MD;  Location: [facility] OR;  Service: General;  Laterality: Left;   JOINT REPLACEMENT     Left hip Dr. Vernetta 11/29/16   TOTAL HIP ARTHROPLASTY Left 11/29/2016   Procedure: LEFT TOTAL HIP ARTHROPLASTY ANTERIOR APPROACH;  Surgeon: Vernetta Lonni GRADE, MD;  Location: [facility];  Service: Orthopedics;  Laterality: Left;    Current Medications: Current Meds  Medication Sig   acetaminophen  (TYLENOL ) 500 MG tablet Take 1,000 mg by mouth daily as needed for moderate pain (pain score 4-6) or headache.   BIMZELX 320 MG/2ML pen Inject 320 mg into the skin every 14 (fourteen) days.   cabotegravir  & rilpivirine  ER (CABENUVA ) 600 & 900 MG/3ML injection Inject 1 kit into the muscle every 2 (two) months.  latanoprost (XALATAN) 0.005 % ophthalmic solution Place 1 drop into both eyes at bedtime.   Multiple Vitamin (MULTIVITAMIN WITH MINERALS) TABS tablet Take 1 tablet by mouth daily.   rosuvastatin  (CRESTOR ) 10 MG tablet TAKE 1 TABLET BY MOUTH DAILY   triamcinolone  ointment (KENALOG ) 0.1 % Apply 1 Application topically 2 (two) times daily.   XARELTO  20 MG TABS tablet Take 20 mg by mouth daily.     Allergies:   Sulfa antibiotics and Firvanq  [vancomycin ]   Social History   Socioeconomic History   Marital status: Single    Spouse name: Not on file   Number of children: 2   Years of education: Not on file   Highest education level: Not on file  Occupational History   Occupation: Disability  Tobacco Use   Smoking status: Never     Passive exposure: Never   Smokeless tobacco: Never  Vaping Use   Vaping status: Never Used  Substance and Sexual Activity   Alcohol use: No    Comment: prior   Drug use: No    Comment: prior cocaine use, last 2005   Sexual activity: Not Currently    Partners: Female    Birth control/protection: Condom  Other Topics Concern   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, mailroom, warehouse work   Lives w/ fiancee   No EtOH, tobacco, drugs   Social Drivers of Corporate Investment Banker Strain: Medium Risk (02/18/2022)   Overall Financial Resource Strain (CARDIA)    Difficulty of Paying Living Expenses: Somewhat hard  Food Insecurity: No Food Insecurity (09/12/2023)   Hunger Vital Sign    Worried About Running Out of Food in the Last Year: Never true    Ran Out of Food in the Last Year: Never true  Transportation Needs: No Transportation Needs (09/12/2023)   PRAPARE - Administrator, Civil Service (Medical): No    Lack of Transportation (Non-Medical): No  Physical Activity: Insufficiently Active (12/10/2017)   Exercise Vital Sign    Days of Exercise per Week: 2 days    Minutes of Exercise per Session: 10 min  Stress: No Stress Concern Present (12/10/2017)   [employer] of Occupational Health - Occupational Stress Questionnaire    Feeling of Stress : Only a little  Social Connections: Somewhat Isolated (12/10/2017)   Social Connection and Isolation Panel    Frequency of Communication with Friends and Family: Three times a week    Frequency of Social Gatherings with Friends and Family: More than three times a week    Attends Religious Services: Never    Database Administrator or Organizations: No    Attends Engineer, Structural: Never    Marital Status: Living with partner     Family History: The patient's family history includes Bone cancer in his maternal aunt; Breast cancer in his maternal aunt; Heart attack (age  of onset: 33) in his father and mother; Prostate cancer in his brother. There is no history of Colon cancer, Esophageal cancer, Stomach cancer, Rectal cancer, Migraines, or Headache.  ROS:   Please see the history of present illness.     All other systems reviewed and are negative.  EKGs/Labs/Other Studies Reviewed:    The following studies were reviewed today:   EKG:   11/06/2023: Sinus bradycardia, frequent PVCs, rate 50  Recent Labs: 10/31/2023: B Natriuretic Peptide 79.6 11/11/2023: Magnesium 2.0 01/27/2024: ALT 8; BUN 10; Creatinine, Ser 1.23; Hemoglobin  14.6; Platelets 213; Potassium 3.8; Sodium 141; TSH 1.360  Recent Lipid Panel    Component Value Date/Time   CHOL 128 01/27/2024 1019   TRIG 59 01/27/2024 1019   HDL 68 01/27/2024 1019   CHOLHDL 1.9 01/27/2024 1019   CHOLHDL 1.8 10/14/2023 0925   VLDL 12 09/12/2023 1836   LDLCALC 47 01/27/2024 1019   LDLCALC 44 10/14/2023 0925    Physical Exam:    VS:  There were no vitals taken for this visit.    Wt Readings from Last 3 Encounters:  01/27/24 278 lb (126.1 kg)  12/02/23 282 lb 6.4 oz (128.1 kg)  11/19/23 283 lb 14.4 oz (128.8 kg)     GEN:  Well nourished, well developed in no acute distress HEENT: Normal NECK: No JVD; No carotid bruits CARDIAC: RRR, no murmurs, rubs, gallops RESPIRATORY:  Clear to auscultation without rales, wheezing or rhonchi  ABDOMEN: Soft, non-tender, non-distended MUSCULOSKELETAL:  No edema; No deformity  SKIN: Warm and dry NEUROLOGIC:  Alert and oriented x 3 PSYCHIATRIC:  Normal affect   ASSESSMENT:    1. Orthostatic hypotension   2. PVC's (premature ventricular contractions)   3. Precordial pain   4. Coronary artery disease involving native coronary artery of native heart without angina pectoris   5. Hyperlipidemia, unspecified hyperlipidemia type     PLAN:    Orthostatic hypotension: Noted on ED visit 10/31/23.  His Lasix  was discontinued.  Also stopped his olmesartan  and  amlodipine .  Orthostatics in clinic 11/06/23 continued to show significant drop in BP with standing.  Discussed staying well-hydrated and use of compression stockings.  Would continue to hold antihypertensives and Lasix .  BMET, TSH unremarkable on 8/12 - Orthostatics in clinic today are improved and he reports symptoms have significantly improved.  Recommend continuing to stay well-hydrated and use compression stockings  Frequent PVCs: Zio patch x 7 days 10/2023 showed 1 episode of SVT lasting 11 beats, frequent PVCs (6.7% of beats).  Appears asymptomatic, echocardiogram 08/2023 without structural heart disease, no indication for treatment at this time  Chest plain/DOE:  Echocardiogram 09/12/2023 showed normal biventricular function, no significant valvular disease.  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 - Recommend stress test to rule out ischemia from anomalous RCA with interarterial course.  This was planned for 10/31/23, but was canceled due to orthostatic hypotension.  Now that orthostasis has resolved, can proceed with exercise Myoview   Hypertension: Previously on olmesartan  20 mg daily and amlodipine  10 mg daily, but currently on hold given orthostatic hypotension as above  DVT: On Xarelto   Hyperlipidemia: On rosuvastatin  10 mg daily.  LDL 44 on 10/14/2023  HIV: On HAART  RTC in 3 months  Informed Consent   Shared Decision Making/Informed Consent The risks [chest pain, shortness of breath, cardiac arrhythmias, dizziness, blood pressure fluctuations, myocardial infarction, stroke/transient ischemic attack, nausea, vomiting, allergic reaction, radiation exposure, metallic taste sensation and life-threatening complications (estimated to be 1 in 10,000)], benefits (risk stratification, diagnosing coronary artery disease, treatment guidance) and  alternatives of a nuclear stress test were discussed in detail with Mr. Tanney and he agrees to proceed.       Medication Adjustments/Labs and Tests Ordered: Current medicines are reviewed at length with the patient today.  Concerns regarding medicines are outlined above.  Orders Placed This Encounter  Procedures   Myocardial Perfusion Imaging   No  orders of the defined types were placed in this encounter.   Patient Instructions  Medication Instructions: Your physician recommends that you continue on your current medications as directed. Please refer to the Current Medication list given to you today.  *If you need a refill on your cardiac medications before your next appointment, please call your pharmacy*  Lab Work: none If you have labs (blood work) drawn today and your tests are completely normal, you will receive your results only by: MyChart Message (if you have MyChart) OR A paper copy in the mail If you have any lab test that is abnormal or we need to change your treatment, we will call you to review the results.  Testing/Procedures: Your physician has requested that you have en exercise stress myoview . For further information please visit [email or web address] Please follow instruction sheet, as given.   Follow-Up: At [facility], you and your health needs are our priority.  As part of our continuing mission to provide you with exceptional heart care, our providers are all part of one team.  This team includes your primary Cardiologist (physician) and Advanced Practice Providers or APPs (Physician Assistants and Nurse Practitioners) who all work together to provide you with the care you need, when you need it.  Your next appointment:   6 month(s)  Provider:   Dr. Kate  We recommend signing up for the patient portal called MyChart.  Sign up information is provided on this After Visit Summary.  MyChart is used to connect with patients for Virtual Visits  (Telemedicine).  Patients are able to view lab/test results, encounter notes, upcoming appointments, etc.  Non-urgent messages can be sent to your provider as well.   To learn more about what you can do with MyChart, go to [email or web address].   Other Instructions none           Signed, Lonni LITTIE Kate, MD  03/04/2024 5:30 PM    [facility]

## 2024-03-01 MED ORDER — TRIAMCINOLONE ACETONIDE 0.1 % TOPICAL OINTMENT
INTRAMUSCULAR | 11 refills | 0.00000 days | Status: CP
Start: 2024-03-01 — End: ?

## 2024-03-01 NOTE — Telephone Encounter (Signed)
 Prescription refill request for Triamcinolone  0.1% ointment, Last office visit was 11/17/23.    Please Advise

## 2024-03-02 ENCOUNTER — Ambulatory Visit: Attending: Cardiology | Admitting: Cardiology

## 2024-03-02 DIAGNOSIS — I951 Orthostatic hypotension: Secondary | ICD-10-CM | POA: Diagnosis not present

## 2024-03-02 DIAGNOSIS — I2510 Atherosclerotic heart disease of native coronary artery without angina pectoris: Secondary | ICD-10-CM

## 2024-03-02 DIAGNOSIS — R072 Precordial pain: Secondary | ICD-10-CM | POA: Diagnosis not present

## 2024-03-02 DIAGNOSIS — I493 Ventricular premature depolarization: Secondary | ICD-10-CM

## 2024-03-02 DIAGNOSIS — E785 Hyperlipidemia, unspecified: Secondary | ICD-10-CM

## 2024-03-02 DIAGNOSIS — R079 Chest pain, unspecified: Secondary | ICD-10-CM

## 2024-03-02 NOTE — Patient Instructions (Signed)
 Medication Instructions: Your physician recommends that you continue on your current medications as directed. Please refer to the Current Medication list given to you today.  *If you need a refill on your cardiac medications before your next appointment, please call your pharmacy*  Lab Work: none If you have labs (blood work) drawn today and your tests are completely normal, you will receive your results only by: MyChart Message (if you have MyChart) OR A paper copy in the mail If you have any lab test that is abnormal or we need to change your treatment, we will call you to review the results.  Testing/Procedures: Your physician has requested that you have en exercise stress myoview . For further information please visit [email or web address] Please follow instruction sheet, as given.   Follow-Up: At [facility], you and your health needs are our priority.  As part of our continuing mission to provide you with exceptional heart care, our providers are all part of one team.  This team includes your primary Cardiologist (physician) and Advanced Practice Providers or APPs (Physician Assistants and Nurse Practitioners) who all work together to provide you with the care you need, when you need it.  Your next appointment:   6 month(s)  Provider:   Dr. Kate  We recommend signing up for the patient portal called MyChart.  Sign up information is provided on this After Visit Summary.  MyChart is used to connect with patients for Virtual Visits (Telemedicine).  Patients are able to view lab/test results, encounter notes, upcoming appointments, etc.  Non-urgent messages can be sent to your provider as well.   To learn more about what you can do with MyChart, go to [email or web address].   Other Instructions none

## 2024-03-08 ENCOUNTER — Other Ambulatory Visit: Payer: Self-pay | Admitting: Cardiology

## 2024-03-08 ENCOUNTER — Telehealth (HOSPITAL_COMMUNITY): Payer: Self-pay

## 2024-03-08 DIAGNOSIS — R072 Precordial pain: Secondary | ICD-10-CM

## 2024-03-08 NOTE — Telephone Encounter (Signed)
 Spoke with the patient, detailed instructions given. Allen Espinoza CCT

## 2024-03-09 ENCOUNTER — Ambulatory Visit: Payer: Self-pay | Admitting: Cardiology

## 2024-03-09 ENCOUNTER — Ambulatory Visit (HOSPITAL_COMMUNITY)
Admission: RE | Admit: 2024-03-09 | Discharge: 2024-03-09 | Disposition: A | Source: Ambulatory Visit | Attending: Cardiology | Admitting: Cardiology

## 2024-03-09 DIAGNOSIS — R072 Precordial pain: Secondary | ICD-10-CM

## 2024-03-09 LAB — MYOCARDIAL PERFUSION IMAGING
Base ST Depression (mm): 0 mm
Estimated workload: 4.6
LV dias vol: 136 mL (ref 62–150)
LV sys vol: 57 mL (ref 4.2–5.8)
MPHR: 123 {beats}/min
Nuc Stress EF: 58 %
Peak HR: 123 {beats}/min
RPE: 20
Rest HR: 46 {beats}/min
Rest Nuclear Isotope Dose: 12 mCi
SDS: 0
SRS: 2
SSS: 1
ST Depression (mm): 0 mm
Stress Nuclear Isotope Dose: 37.9 mCi
TID: 0.89

## 2024-03-09 MED ORDER — TECHNETIUM TC 99M TETROFOSMIN IV KIT
37.9000 | PACK | Freq: Once | INTRAVENOUS | Status: AC | PRN
  Administered 2024-03-09: 37.9 via INTRAVENOUS

## 2024-03-09 MED ORDER — TECHNETIUM TC 99M TETROFOSMIN IV KIT
12.0000 | PACK | Freq: Once | INTRAVENOUS | Status: AC | PRN
  Administered 2024-03-09: 12 via INTRAVENOUS

## 2024-03-09 MED ORDER — REGADENOSON 0.4 MG/5ML IV SOLN
0.4000 mg | Freq: Once | INTRAVENOUS | Status: AC
  Administered 2024-03-09: 0.4 mg via INTRAVENOUS

## 2024-03-09 MED ORDER — REGADENOSON 0.4 MG/5ML IV SOLN
INTRAVENOUS | Status: AC
  Filled 2024-03-09: qty 5

## 2024-03-11 NOTE — Progress Notes (Signed)
 [facility]    Patient Onboarding/Medication Counseling    Dustin Prince is a 63 y.o. male with Hidradenitis suppurativa who I am counseling today on initiation of therapy.  I am speaking to the patient.    Was a nurse, learning disability used for this call? No    Verified patient's date of birth / HIPAA.    Specialty medication(s) to be sent: Inflammatory Disorders: Humira       Non-specialty medications/supplies to be sent: n/a      Medications not needed at this time: n/a         Humira  (adalimumab )    The patient declined counseling on medication administration, missed dose instructions, goals of therapy, side effects and monitoring parameters, warnings and precautions, drug/food interactions, and storage, handling precautions, and disposal because they have taken the medication previously. The information in the declined sections below are for informational purposes only and was not discussed with patient.       Medication & Administration     Dosage: Hidradenitis supprativa: Inject 160mg  under the skin on day 1, 80mg  on day 15, then 80mg  every 14 days starting on day 29    Lab tests required prior to treatment initiation:  Tuberculosis: Tuberculosis screening resulted in a non-reactive Quantiferon TB Gold assay.  Hepatitis B: Hepatitis B serology studies are complete and non-reactive.    Administration:     Prefilled auto-injector pen  1. Gather all supplies needed for injection on a clean, flat working surface: medication pen removed from packaging, alcohol swab, sharps container, etc.  2. Look at the medication label - look for correct medication, correct dose, and check the expiration date  3. Look at the medication - the liquid visible in the window on the side of the pen device should appear clear and colorless  4. Lay the auto-injector pen on a flat surface and allow it to warm up to room temperature for at least 30-45 minutes  5. Select injection site - you can use the front of your thigh or your belly (but not the area 2 inches around your belly button); if someone else is giving you the injection you can also use your upper arm in the skin covering your triceps muscle  6. Prepare injection site - wash your hands and clean the skin at the injection site with an alcohol swab and let it air dry, do not touch the injection site again before the injection  7. Pull the 2 safety caps straight off - gray/white to uncover the needle cover and the plum cap to uncover the plum activator button, do not remove until immediately prior to injection and do not touch the white needle cover  8. Gently squeeze the area of cleaned skin and hold it firmly to create a firm surface at the selected injection site  9. Put the white needle cover against your skin at the injection site at a 90 degree angle, hold the pen such that you can see the clear medication window  10. Press down and hold the pen firmly against your skin, press the plum activator button to initiate the injection, there will be a click when the injection starts  11. Continue to hold the pen firmly against your skin for about 10-15 seconds - the window will start to turn solid yellow  12. To verify the injection is complete after 10-15 seconds, look and ensure the window is solid yellow and then pull the pen away from your skin  13.  Dispose of the used auto-injector pen immediately in your sharps disposal container the needle will be covered automatically  14. If you see any blood at the injection site, press a cotton ball or gauze on the site and maintain pressure until the bleeding stops, do not rub the injection site    Adherence/Missed dose instructions:  If your injection is given more than 3 days after your scheduled injection date - consult your pharmacist for additional instructions on how to adjust your dosing schedule.    Goals of Therapy     - Reduce the frequency and severity of new lesions  - Minimize pain and suppuration  - Prevent disease progression and limit scarring  - Maintenance of effective psychosocial functioning    Side Effects & Monitoring Parameters     Injection site reaction (redness, irritation, inflammation localized to the site of administration)  Signs of a common cold - minor sore throat, runny or stuffy nose, etc.  Upset stomach  Headache    The following side effects should be reported to the provider:  Signs of a hypersensitivity reaction - rash; hives; itching; red, swollen, blistered, or peeling skin; wheezing; tightness in the chest or throat; difficulty breathing, swallowing, or talking; swelling of the mouth, face, lips, tongue, or throat; etc.  Reduced immune function - report signs of infection such as fever; chills; body aches; very bad sore throat; ear or sinus pain; cough; more sputum or change in color of sputum; pain with passing urine; wound that will not heal, etc.  Also at a slightly higher risk of some malignancies (mainly skin and blood cancers) due to this reduced immune function.  In the case of signs of infection - the patient should hold the next dose of Humira ?? and call your primary care provider to ensure adequate medical care.  Treatment may be resumed when infection is treated and patient is asymptomatic.  Changes in skin - a new growth or lump that forms; changes in shape, size, or color of a previous mole or marking  Signs of unexplained bruising or bleeding - throwing up blood or emesis that looks like coffee grounds; black, tarry, or bloody stool; etc.  Signs of new or worsening heart failure - shortness of breath; sudden weight gain; heartbeat that is not normal; swelling in the arms or legs that is new or worse      Contraindications, Warnings, & Precautions     Have your bloodwork checked as you have been told by your prescriber  Talk with your doctor if you are pregnant, planning to become pregnant, or breastfeeding  Discuss the possible need for holding your dose(s) of Humira ?? when a planned procedure is scheduled with the prescriber as it may delay healing/recovery timeline       Drug/Food Interactions     Medication list reviewed in Epic. The patient was instructed to inform the care team before taking any new medications or supplements. No drug interactions identified.   Talk with you prescriber or pharmacist before receiving any live vaccinations while taking this medication and after you stop taking it    Storage, Handling Precautions, & Disposal     Store this medication in the refrigerator.  Do not freeze  If needed, you may store at room temperature for up to 14 days  Store in original packaging, protected from light  Do not shake  Dispose of used syringes/pens in a sharps disposal container    Current Medications (including OTC/herbals), Comorbidities and Allergies  Current Medications[1]    Allergies[2]    Problem List[3]    Medication list has been reviewed and updated in Epic: Yes    Allergies have been reviewed and updated in Epic: Yes    Appropriateness of Therapy     Acute infections noted within Epic:  No active infections  Patient reported infection: None    Is the medication and dose appropriate considering the patient???s diagnosis, treatment, and disease journey, comorbidities, medical history, current medications, allergies, therapeutic goals, self-administration ability, and access barriers? Yes    Prescription has been clinically reviewed: Yes      Baseline Quality of Life Assessment      How many days over the past month did your HS  keep you from your normal activities? For example, brushing your teeth or getting up in the morning. 0    Financial Information     Medication Assistance provided: Prior Authorization    Anticipated copay of $0 reviewed with patient. Verified delivery address.    Delivery Information     Scheduled delivery date: Loading Dose (14 day supply):  03/16/2024      Mainten. Dose (28 day supply): 03/30/2024    Expected start date: 03/16/2024    Medication will be delivered via UPS to the prescription address in [facility].  This shipment will not require a signature.      Explained the services we provide at [facility] and that each month we would call to set up refills.  Stressed importance of returning phone calls so that we could ensure they receive their medications in time each month.  Informed patient that we should be setting up refills 7-10 days prior to when they will run out of medication.  A pharmacist will reach out to perform a clinical assessment periodically.  Informed patient that a welcome packet, containing information about our pharmacy and other support services, a Notice of Privacy Practices, and a drug information handout will be sent.      The patient or caregiver noted above participated in the development of this care plan and knows that they can request review of or adjustments to the care plan at any time.      Patient or caregiver verbalized understanding of the above information as well as how to contact the pharmacy at [phone] option 4 with any questions/concerns.  The pharmacy is open Monday through Friday 8:30am-4:30pm.  A pharmacist is available 24/7 via pager to answer any clinical questions they may have.    Patient Specific Needs     Does the patient have any physical, cognitive, or cultural barriers? No    Does the patient have adequate living arrangements? (i.e. the ability to store and take their medication appropriately) Yes    Did you identify any home environmental safety or security hazards? No    Patient prefers to have medications discussed with  Patient     Is the patient or caregiver able to read and understand education materials at a high school level or above? Yes    Patient's primary language is  English     Is the patient high risk? No    Does the patient have an additional or emergency contact listed in their chart? Yes    SOCIAL DETERMINANTS OF HEALTH     At the [facility], we have learned that life circumstances - like trouble affording food, housing, utilities, or transportation can affect the health of many of our patients.  That is why we wanted to ask: are you currently experiencing any life circumstances that are negatively impacting your health and/or quality of life? Patient declined to answer    Social Drivers of Health     Food Insecurity: No Food Insecurity (09/12/2023)    Received from [facility]    Hunger Vital Sign     Within the past 12 months, you worried that your food would run out before you got the money to buy more.: Never true     Within the past 12 months, the food you bought just didn't last and you didn't have money to get more.: Never true   Tobacco Use: Low Risk (12/29/2023)    Patient History     Smoking Tobacco Use: Never     Smokeless Tobacco Use: Never     Passive Exposure: Not on file   Transportation Needs: No Transportation Needs (09/12/2023)    Received from [facility] - [facility]     In the past 12 months, has lack of transportation kept you from medical appointments or from getting medications?: No     In the past 12 months, has lack of transportation kept you from meetings, work, or from getting things needed for daily living?: No   Alcohol Use: Not on file   Housing: Not on file   Physical Activity: Not on file   Utilities: Not At Risk (09/12/2023)    Received from [facility] Utilities     In the past 12 months has the electric, gas, oil, or water company threatened to shut off services in your home?: No   Stress: Not on file   Interpersonal Safety: Not At Risk (12/29/2023)    Interpersonal Safety     Unsafe Where You Currently Live: No     Physically Hurt by Anyone: No     Abused by Anyone: No   Substance Use: Not on file (02/04/2023)   Intimate Partner Violence: Not At Risk (09/12/2023)    Received from [facility]    Humiliation, Afraid, Rape, and Kick questionnaire     Within the last year, have you been afraid of your partner or ex-partner?: No     Within the last year, have you been humiliated or emotionally abused in other ways by your partner or ex-partner?: No     Within the last year, have you been kicked, hit, slapped, or otherwise physically hurt by your partner or ex-partner?: No     Within the last year, have you been raped or forced to have any kind of sexual activity by your partner or ex-partner?: No   Social Connections: Not on file   Financial Resource Strain: Medium Risk (02/18/2022)    Received from [facility]    Overall Financial Resource Strain (CARDIA)     Difficulty of Paying Living Expenses: Somewhat hard   Health Literacy: Not on file   Internet Connectivity: Not on file       Would you be willing to receive help with any of the needs that you have identified today? Not applicable       Velma DELENA Eon, PharmD  [facility] Specialty Pharmacist       [1]   Current Outpatient Medications   Medication Sig Dispense Refill    acetaminophen  (TYLENOL ) 500 MG tablet Take 2 tablets (1,000 mg total) by mouth.      adalimumab  (HUMIRA ,CF, PEN) 80 mg/0.8 mL  PnKt Inject the contents of 1 pen (80 mg) under the skin every fourteen (14) days for maintenance dose 2 each 11    adalimumab  80 mg/0.8 mL PnKt Inject the contents of 2 pens (160 mg) under the skin once for loading dose 2 each 0    bictegrav-emtricit-tenofov ala (BIKTARVY) 50-200-25 mg tablet Take 1 tablet by mouth daily. (Patient not taking: Reported on 12/29/2023)      bimekizumab -bkzx (BIMZELX  AUTOINJECTOR) 320 mg/2 mL AtIn Inject the contents of 1 auto-injector (320mg ) under the skin at week 0, 2, 4, 6, 8, 10, 12, 14, 16 4 mL 4    bimekizumab -bkzx (BIMZELX  AUTOINJECTOR) 320 mg/2 mL AtIn Inject the contents of 1 auto-injector (320mg ) under the skin every 4 weeks for maintenance 2 mL 11    cabotegravir-rilpivirine (CABENUVA) 600 mg-900 mg/3 mL extended-release injection Inject 1 kit into the muscle.      empty container Misc Use as directed to dispose of Bimzelx  pens. 1 each 2    losartan  (COZAAR ) 50 MG tablet       methocarbamol (ROBAXIN) 750 MG tablet Take 1 tablet (750 mg total) by mouth.      metroNIDAZOLE  (FLAGYL ) 250 MG tablet TAKE 1 TABLET BY MOUTH 3 TIMES DAILY (Patient not taking: Reported on 12/29/2023) 90 tablet 10    pantoprazole (PROTONIX) 40 MG tablet  (Patient not taking: Reported on 12/29/2023)      rivaroxaban (XARELTO) 20 mg tablet Take 20 mg by mouth daily with evening meal.      rosuvastatin (CRESTOR) 10 MG tablet Take 1 tablet (10 mg total) by mouth daily.      secukinumab  300 mg/2 mL PnIj Inject 300 mg under the skin once a week. Loading doses - inject 300mg  weekly on week 0, 1, 2, 3, and 4 (5 weeks total) 10 mL 0    secukinumab  300 mg/2 mL PnIj Inject 300 mg under the skin every twenty-eight (28) days. Maintenance dosing. 2 mL 11    triamcinolone  (KENALOG ) 0.1 % ointment APPLY TOPICALLY TWICE DAILY TO AFFECTED AREAS AS NEEDED FOR DERMATITIS IN THE GLUTEAL CLEFT 454 g 11     No current facility-administered medications for this visit.   [2]   Allergies  Allergen Reactions    Sulfasalazine      Other reaction(s): Other (See Comments)  Low blood pressure    Vancomycin Itching     Give with benadryl   [3]   Patient Active Problem List  Diagnosis    Hidradenitis suppurativa    HIV (human immunodeficiency virus infection)    (CMS-HCC)    Asthma (HHS-HCC)    At high risk for cardiovascular disease    Avascular necrosis of bone of hip, left (CMS-HCC)    Chest pain    Cholelithiasis    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 (CMS-HCC)    Chronic kidney disease, stage 3a (CMS-HCC)    Diverticulosis of colon without hemorrhage    DVT (deep venous thrombosis) (CMS-HCC)    GERD (gastroesophageal reflux disease)    Hypertension    Morbid obesity (CMS-HCC)    Multiple lipomas    Numbness    Sleep disorder    Solitary pulmonary nodule    Subdural hematoma (CMS-HCC)    Subcutaneous nodules    Symptomatic bradycardia    Weight loss    Hidradenitis

## 2024-03-19 ENCOUNTER — Ambulatory Visit: Payer: Self-pay

## 2024-03-19 NOTE — Telephone Encounter (Signed)
 FYI Only or Action Required?: FYI only for provider: appointment scheduled on 12/22.  Patient was last seen in primary care on 01/27/2024 by de Cuba, Quintin PARAS, MD.  Called Nurse Triage reporting Leg Swelling.  Symptoms began several days ago.  Interventions attempted: Rest, hydration, or home remedies.  Symptoms are: stable.  Triage Disposition: See PCP When Office is Open (Within 3 Days)  Patient/caregiver understands and will follow disposition?: Yes  Copied from CRM #[id number]. Topic: Clinical - Red Word Triage >> Mar 19, 2024  9:58 AM Kevelyn M wrote: Red Word that prompted transfer to Nurse Triage: Blood cots in legs, some swelling, and bottom of legs are bruised. Just noticed the bruising. Reason for Disposition  [1] MILD swelling of both ankles (i.e., pedal edema) AND [2] new-onset or getting worse  Answer Assessment - Initial Assessment Questions Patient on Xarelto  for blood clot in LE. Was having bruising but now realizing his skin is darker, dryer, shiney,. Skin peeling and tight on the top of the feet and ankles.   Has CP and SOB that he sees Cards for and recent stress test. No changes in that regard. Denies fever, hot hard knot, redness, or temperature changes.  Was on diuretic but was bottoming out his BP so was discontinued.  Appt with PCP on 12/22 to assess. Patient will go to ED with any changes or worsening.    1. ONSET: When did the swelling start? (e.g., minutes, hours, days)     Thought it was bruising from blood thinners but noticed today 2. LOCATION: What part of the leg is swollen?  Are both legs swollen or just one leg?     Bilateral  3. SEVERITY: How bad is the swelling? (e.g., localized; mild, moderate, severe)     Mild- more tightness 4. REDNESS: Is there redness or signs of infection?     denies 5. PAIN: Is the swelling painful to touch? If Yes, ask: How painful is it?   (Scale 1-10; mild, moderate or severe)     denies 6. FEVER: Do  you have a fever? If Yes, ask: What is it, how was it measured, and when did it start?      Denies  7. CAUSE: What do you think is causing the leg swelling?     unsure 8. MEDICAL HISTORY: Do you have a history of blood clots (e.g., DVT), cancer, heart failure, kidney disease, or liver failure?     HTN, DVT,  9. RECURRENT SYMPTOM: Have you had leg swelling before? If Yes, ask: When was the last time? What happened that time?    denies 10. OTHER SYMPTOMS: Do you have any other symptoms? (e.g., chest pain, difficulty breathing)       Tight skin  Protocols used: Leg Swelling and Edema-A-AH

## 2024-03-19 NOTE — Telephone Encounter (Signed)
 Called patient and informed him per Dr. Paulita OK to apply triamcinolone  ointment to lower legs as next step.    Advised patient his next ov is at [facility] on 04/14/23 at 8:45am.  Patient appreciated calling him back .

## 2024-03-19 NOTE — Telephone Encounter (Signed)
 Returned call to patient after he LVM on nurse line stating needed to speak about circulation lower legs.    Per patient, skin is scaly from shin down and have noticed increase in bruising and clots.  Denies any pain or itching but just noticed skin is thicker and rough along with the increase in bruising.  Triamcinolone  ointment is for gluteal cleft so advised patient will ask Dr. Paulita if can apply to legs.        Patient appreciated call back .

## 2024-03-22 ENCOUNTER — Ambulatory Visit (INDEPENDENT_AMBULATORY_CARE_PROVIDER_SITE_OTHER): Admitting: Family Medicine

## 2024-03-22 ENCOUNTER — Encounter (HOSPITAL_BASED_OUTPATIENT_CLINIC_OR_DEPARTMENT_OTHER): Payer: Self-pay | Admitting: Family Medicine

## 2024-03-22 VITALS — BP 139/73 | HR 47 | Temp 97.6°F | Resp 18 | Ht 72.0 in | Wt 281.0 lb

## 2024-03-22 DIAGNOSIS — M2011 Hallux valgus (acquired), right foot: Secondary | ICD-10-CM | POA: Diagnosis not present

## 2024-03-22 DIAGNOSIS — I872 Venous insufficiency (chronic) (peripheral): Secondary | ICD-10-CM | POA: Insufficient documentation

## 2024-03-22 DIAGNOSIS — M2010 Hallux valgus (acquired), unspecified foot: Secondary | ICD-10-CM | POA: Insufficient documentation

## 2024-03-22 NOTE — Assessment & Plan Note (Signed)
 Discussed considerations, will proceed with referral to podiatry.

## 2024-03-22 NOTE — Progress Notes (Signed)
" ° ° °  Procedures performed today:    None.  Independent interpretation of notes and tests performed by another provider:   None.  Brief History, Exam, Impression, and Recommendations:    BP 139/73 (BP Location: Left Arm, Patient Position: Sitting, Cuff Size: Large)   Pulse (!) 47   Temp 97.6 F (36.4 C) (Oral)   Resp 18   Ht 6' (1.829 m)   Wt 281 lb (127.5 kg)   SpO2 100%   BMI 38.11 kg/m   Discussed the use of AI scribe software for clinical note transcription with the patient, who gave verbal consent to proceed.  History of Present Illness Allen Espinoza is a 63 year old male with a history of blood clots in his legs who presents with skin changes and pain in his legs.  He has a history of blood clots in his legs, which previously caused swelling. The swelling has since decreased, but he has noticed skin changes on both legs. Initially, he thought it was dry skin and applied lotion, but the skin began to darken, become smooth, and thin out. He describes the skin turning purple and blue. He has been using a steroid cream, triamcinolone , which he initially used for hidradenitis suppurativa.  He experiences significant pain in one of his legs, which affects his ability to walk. He describes the pain as severe, particularly in the bone, and notes that it is difficult to walk. He has not noticed any recent swelling, attributing his large calves to his natural build.  He also reports pain in his toe, describing it as feeling like the toe does not want to move and hurts when he attempts to do so. He does not currently see a foot doctor.  On exam, patient is in no acute distress, vital signs stable.  Bilateral lower extremities with some scaling/dry skin noted.  There are scattered areas of hyperpigmentation.  Pitting edema is present but is mild, trace to 1+ bilaterally.  No significant tenderness to palpation.  No significant increased warmth in bilateral lower extremities.  No oozing or  drainage present. Right foot with hallux valgus present.  No significant swelling.  No significant tenderness to palpation over first MTP.  Hallux valgus of right foot Assessment & Plan: Discussed considerations, will proceed with referral to podiatry.  Orders: -     Ambulatory referral to Podiatry  Stasis dermatitis Assessment & Plan: Do not suspect cellulitis or significant fluid overload currently.  He does have some mild pitting edema, however this is not significant in office today. Recommend proceeding with topical steroid which was also recommended by dermatologist based on review of records through Care Everywhere.  Recommend intermittent elevation of lower extremities.  He does have appointment with dermatologist in a couple weeks, recommend continuing with this.   Return in about 3 months (around 06/20/2024).   ___________________________________________ Jacqlyn Marolf de [place], MD, ABFM, CAQSM Primary Care and Sports Medicine [facility] "

## 2024-03-22 NOTE — Assessment & Plan Note (Signed)
 Do not suspect cellulitis or significant fluid overload currently.  He does have some mild pitting edema, however this is not significant in office today. Recommend proceeding with topical steroid which was also recommended by dermatologist based on review of records through Care Everywhere.  Recommend intermittent elevation of lower extremities.  He does have appointment with dermatologist in a couple weeks, recommend continuing with this.

## 2024-03-29 MED FILL — HUMIRA(CF) PEN 80 MG/0.8 ML SUBCUTANEOUS KIT: SUBCUTANEOUS | 28 days supply | Qty: 2 | Fill #0

## 2024-03-31 ENCOUNTER — Ambulatory Visit (INDEPENDENT_AMBULATORY_CARE_PROVIDER_SITE_OTHER): Admitting: Podiatry

## 2024-03-31 DIAGNOSIS — M19071 Primary osteoarthritis, right ankle and foot: Secondary | ICD-10-CM

## 2024-03-31 NOTE — Progress Notes (Signed)
 "  Subjective:  Patient ID: Allen Espinoza, male    DOB: 06/06/1960,  MRN: [id number]  Chief Complaint  Patient presents with   Toe Pain    Pt stated that he is having pain in his right big toe     63 y.o. male presents with the above complaint.  Patient presents for right first metatarsal phalangeal joint hallux limitus with some arthritis.  Patient states he got stiff last week and was painful is doing much better today just want to get it evaluated pain scale  out of 10 dull aching nature hurts with ambulation or shoe pressure.  He just wants to discuss treatment options for the bunion deformity.   Review of Systems: Negative except as noted in the HPI. Denies N/V/F/Ch.  Past Medical History:  Diagnosis Date   Abscess    Between legs   Arthritis    all over   Asthma    Avascular necrosis of femoral head, left (HCC)    Blood transfusion without reported diagnosis    Cholelithiasis    CKD (chronic kidney disease) stage 3, GFR 30-59 ml/min (HCC)    sees kidney Dr.   Clotting disorder    left DVT   Diverticulosis    DVT (deep venous thrombosis) (HCC)    legs   Dyspnea    when walking   Dysrhythmia    remembers mother taking about having an irregular rhythm years when he was a child    GERD (gastroesophageal reflux disease)    HIV (human immunodeficiency virus infection) (HCC)    Hypertension    Morbid obesity (HCC)    Current Medications[1]  Tobacco Use History[2]  Allergies[3] Objective:  There were no vitals filed for this visit. There is no height or weight on file to calculate BMI. Constitutional Well developed. Well nourished.  Vascular Dorsalis pedis pulses palpable bilaterally. Posterior tibial pulses palpable bilaterally. Capillary refill normal to all digits.  No cyanosis or clubbing noted. Pedal hair growth normal.  Neurologic Normal speech. Oriented to person, place, and time. Epicritic sensation to light touch grossly present bilaterally.   Dermatologic Nails well groomed and normal in appearance. No open wounds. No skin lesions.  Orthopedic: Mild pain on palpation of right first metatarsophalangeal joint no pain with extensor flexor tendinitis.  Deep intra-articular pain noted hallux limitus noted some crepitus noted.  Findings consistent with osteoarthritis of the first metatarsophalangeal joint   Radiographs: None Assessment:   1. Arthritis of first metatarsophalangeal (MTP) joint of right foot    Plan:  Patient was evaluated and treated and all questions answered.  Right first MTP arthritis/hallux limitus - All questions and concerns were discussed with the patient in extensive detail given the amount of pain that he is having he will benefit from steroid injection however his pain is much better so we will hold off on steroid injection.  In the future patient may need MPJ fusion.  He will come back and see me.  I extensively discussed shoe gear modification as well.  No follow-ups on file.     [1]  Current Outpatient Medications:    acetaminophen  (TYLENOL ) 500 MG tablet, Take 1,000 mg by mouth daily as needed for moderate pain (pain score 4-6) or headache., Disp: , Rfl:    adalimumab  (HUMIRA , 2 PEN,) 40 MG/0.4ML pen, Inject 40 mg into the skin every Thursday., Disp: , Rfl:    BIMZELX 320 MG/2ML pen, Inject 320 mg into the skin every 14 (fourteen) days., Disp: ,  Rfl:    cabotegravir  & rilpivirine  ER (CABENUVA ) 600 & 900 MG/3ML injection, Inject 1 kit into the muscle every 2 (two) months., Disp: 6 mL, Rfl: 5   latanoprost (XALATAN) 0.005 % ophthalmic solution, Place 1 drop into both eyes at bedtime., Disp: , Rfl:    metroNIDAZOLE  (FLAGYL ) 250 MG tablet, Take 250 mg by mouth 3 (three) times daily. (Patient not taking: Reported on 03/22/2024), Disp: , Rfl:    Multiple Vitamin (MULTIVITAMIN WITH MINERALS) TABS tablet, Take 1 tablet by mouth daily., Disp: , Rfl:    rosuvastatin  (CRESTOR ) 10 MG tablet, TAKE 1 TABLET BY MOUTH  DAILY, Disp: 90 tablet, Rfl: 1   triamcinolone  ointment (KENALOG) 0.1 %, Apply 1 Application topically 2 (two) times daily., Disp: , Rfl:    XARELTO  20 MG TABS tablet, Take 20 mg by mouth daily., Disp: , Rfl:  [2]  Social History Tobacco Use  Smoking Status Never   Passive exposure: Never  Smokeless Tobacco Never  [3]  Allergies Allergen Reactions   Sulfa Antibiotics Other (See Comments)    Hypotension    Firvanq  [Vancomycin ] Itching    OK to take with Benadryl    "

## 2024-04-02 ENCOUNTER — Telehealth (HOSPITAL_BASED_OUTPATIENT_CLINIC_OR_DEPARTMENT_OTHER): Payer: Self-pay

## 2024-04-02 NOTE — Telephone Encounter (Signed)
 Was able to print chronic condition verification from from [facility] website. Form has been completed and faxed to [phone].

## 2024-04-02 NOTE — Telephone Encounter (Signed)
 Pt called back in to follow up, called [employer] and was informed that document was faxed and sent.

## 2024-04-02 NOTE — Telephone Encounter (Signed)
 Copied from CRM #[id number]. Topic: General - Other >> Apr 02, 2024  9:53 AM Delon DASEN wrote: Reason for CRM: Patient needs form sent to [facility] for chronic condition, has to get set back up for the new year- [phone]

## 2024-04-06 ENCOUNTER — Ambulatory Visit

## 2024-04-06 ENCOUNTER — Other Ambulatory Visit: Payer: Self-pay

## 2024-04-12 ENCOUNTER — Other Ambulatory Visit: Payer: Self-pay

## 2024-04-12 ENCOUNTER — Other Ambulatory Visit (HOSPITAL_COMMUNITY): Payer: Self-pay

## 2024-04-12 NOTE — Progress Notes (Signed)
 Specialty Pharmacy Refill Coordination Note  Allen Espinoza is a 64 y.o. male assessed today regarding refills of clinic administered specialty medication(s) Cabotegravir  & Rilpivirine  (CABENUVA )   Clinic requested Courier to Provider Office   Delivery date: 04/15/24   Verified address: [address] [place] [address]   Medication will be filled on 04/14/24.

## 2024-04-14 ENCOUNTER — Other Ambulatory Visit: Payer: Self-pay

## 2024-04-14 DIAGNOSIS — L732 Hidradenitis suppurativa: Principal | ICD-10-CM

## 2024-04-14 MED ORDER — METRONIDAZOLE 250 MG TABLET
ORAL_TABLET | Freq: Three times a day (TID) | ORAL | 10 refills | 0.00000 days
Start: 2024-04-14 — End: ?

## 2024-04-15 ENCOUNTER — Telehealth: Payer: Self-pay

## 2024-04-15 NOTE — Telephone Encounter (Signed)
 [facility] Patient Advocate Encounter  Patient's medications CABENUVA  have been couriered to [facility] from [facility] and will be administered at the patients appointment on 04/19/24.  Charmaine Sharps, CPhT Specialty Pharmacy Patient [facility] for [facility] Phone: [phone] Fax:  [phone]

## 2024-04-16 MED ORDER — METRONIDAZOLE 250 MG TABLET
ORAL_TABLET | Freq: Three times a day (TID) | ORAL | 10 refills | 30.00000 days | Status: CP
Start: 2024-04-16 — End: ?

## 2024-04-19 ENCOUNTER — Ambulatory Visit: Admitting: Family

## 2024-04-19 ENCOUNTER — Other Ambulatory Visit: Payer: Self-pay

## 2024-04-19 ENCOUNTER — Encounter: Payer: Self-pay | Admitting: Family

## 2024-04-19 VITALS — BP 146/84 | HR 46 | Temp 98.1°F | Ht 72.0 in | Wt 282.0 lb

## 2024-04-19 DIAGNOSIS — Z9189 Other specified personal risk factors, not elsewhere classified: Secondary | ICD-10-CM

## 2024-04-19 DIAGNOSIS — Z0000 Encounter for general adult medical examination without abnormal findings: Secondary | ICD-10-CM | POA: Diagnosis not present

## 2024-04-19 DIAGNOSIS — Z21 Asymptomatic human immunodeficiency virus [HIV] infection status: Secondary | ICD-10-CM

## 2024-04-19 DIAGNOSIS — Z79899 Other long term (current) drug therapy: Secondary | ICD-10-CM

## 2024-04-19 MED ORDER — CABOTEGRAVIR & RILPIVIRINE ER 600 & 900 MG/3ML IM SUER
1.0000 | Freq: Once | INTRAMUSCULAR | Status: AC
  Administered 2024-04-19: 1 via INTRAMUSCULAR

## 2024-04-19 MED ORDER — ROSUVASTATIN CALCIUM 10 MG PO TABS: 10.0000 mg | ORAL_TABLET | Freq: Every day | ORAL | 1 refills | Status: AC

## 2024-04-19 NOTE — Assessment & Plan Note (Signed)
 Discussed importance of safe sexual practice and condom use. Condoms and site specific STD testing offered.  Vaccinations reviewed and following counseling Covid declined.  Routine dental care up to date.  Colon cancer screening up to date.

## 2024-04-19 NOTE — Progress Notes (Signed)
 "   Brief Narrative   Patient ID: Allen Espinoza, male    DOB: [birth year], 64 y.o.   MRN: [id number]  Allen Espinoza is a 64 y/o male with HIV disease diagnosed in 2007 with risk factor of heterosexual contact. Initial viral load and CD4 count are unavailable.Genotype with no medication resistant mutations. No history of opportunistic infection. ART history with Genvoya  and Prezista  (DDI with Xarelto ) and Biktarvy . Now on Cabenuva  with target date of the 20th of the month.   Subjective:   Chief Complaint  Patient presents with   Follow-up    HPI:  Allen Espinoza is a 64 y.o. male with HIV disease last seen on 02/12/2024 by Alan Geralds, PharmD, CPP for every 2 month injection of In bed with good adherence and tolerance to medication.  Last lab work completed on 10/14/2023 with viral load that was undetectable and CD4 count 606.  Most recent renal function, liver function, electrolytes within normal ranges.  Lipid profile with triglycerides 59, HDL 68, and LDL 47.  Remains on rosuvastatin .  Here today for routine follow-up.  Allen Espinoza has been doing well since his last office visit and continues to receive Cabenuva  as prescribed with no adverse side effects or problems obtaining medication.  Housing, transportation, and access to food are stable.  No new concerns/complaints.  Healthcare maintenance reviewed.  Routine dental care up-to-date.  Condoms and site-specific STD testing offered.  Denies fevers, chills, night sweats, headaches, changes in vision, neck pain/stiffness, nausea, diarrhea, vomiting, lesions or rashes.  Lab Results  Component Value Date   CD4TCELL 26 (L) 10/14/2023   CD4TABS 606 10/14/2023   Lab Results  Component Value Date   HIV1RNAQUANT 30 (H) 10/14/2023     Allergies[1]    Outpatient Medications Prior to Visit  Medication Sig Dispense Refill   acetaminophen  (TYLENOL ) 500 MG tablet Take 1,000 mg by mouth daily as needed for moderate pain (pain score 4-6) or headache.      adalimumab  (HUMIRA , 2 PEN,) 40 MG/0.4ML pen Inject 40 mg into the skin every Thursday.     cabotegravir  & rilpivirine  ER (CABENUVA ) 600 & 900 MG/3ML injection Inject 1 kit into the muscle every 2 (two) months. 6 mL 5   latanoprost (XALATAN) 0.005 % ophthalmic solution Place 1 drop into both eyes at bedtime.     metroNIDAZOLE  (FLAGYL ) 250 MG tablet Take 250 mg by mouth 3 (three) times daily.     triamcinolone  ointment (KENALOG) 0.1 % Apply 1 Application topically 2 (two) times daily.     XARELTO  20 MG TABS tablet Take 20 mg by mouth daily.     rosuvastatin  (CRESTOR ) 10 MG tablet TAKE 1 TABLET BY MOUTH DAILY 90 tablet 1   BIMZELX 320 MG/2ML pen Inject 320 mg into the skin every 14 (fourteen) days. (Patient not taking: Reported on 04/19/2024)     Multiple Vitamin (MULTIVITAMIN WITH MINERALS) TABS tablet Take 1 tablet by mouth daily. (Patient not taking: Reported on 04/19/2024)     No facility-administered medications prior to visit.     Past Medical History:  Diagnosis Date   Abscess    Between legs   Arthritis    all over   Asthma    Avascular necrosis of femoral head, left (HCC)    Blood transfusion without reported diagnosis    Cholelithiasis    CKD (chronic kidney disease) stage 3, GFR 30-59 ml/min (HCC)    sees kidney Dr.   Merceda disorder    left  DVT   Diverticulosis    DVT (deep venous thrombosis) (HCC)    legs   Dyspnea    when walking   Dysrhythmia    remembers mother taking about having an irregular rhythm years when he was a child    GERD (gastroesophageal reflux disease)    HIV (human immunodeficiency virus infection) (HCC)    Hypertension    Morbid obesity (HCC)      Past Surgical History:  Procedure Laterality Date   COLONOSCOPY  2019   DENTAL SURGERY     had teeth pulled   HYDRADENITIS EXCISION Left 10/14/2016   Procedure: WIDE EXCISION HIDRADENITIS LEFT AXILLA;  Surgeon: Vernetta Berg, MD;  Location: [facility] OR;  Service: General;  Laterality: Left;    JOINT REPLACEMENT     Left hip Dr. Vernetta 11/29/16   TOTAL HIP ARTHROPLASTY Left 11/29/2016   Procedure: LEFT TOTAL HIP ARTHROPLASTY ANTERIOR APPROACH;  Surgeon: Vernetta Lonni GRADE, MD;  Location: [facility] ORS;  Service: Orthopedics;  Laterality: Left;    Review of Systems  Constitutional:  Negative for appetite change, chills, fatigue, fever and unexpected weight change.  Eyes:  Negative for visual disturbance.  Respiratory:  Negative for cough, chest tightness, shortness of breath and wheezing.   Cardiovascular:  Negative for chest pain and leg swelling.  Gastrointestinal:  Negative for abdominal pain, constipation, diarrhea, nausea and vomiting.  Genitourinary:  Negative for dysuria, flank pain, frequency, genital sores, hematuria and urgency.  Skin:  Negative for rash.  Allergic/Immunologic: Negative for immunocompromised state.  Neurological:  Negative for dizziness and headaches.     Objective:   BP (!) 146/84   Pulse (!) 46   Temp 98.1 F (36.7 C) (Oral)   Ht 6' (1.829 m)   Wt 282 lb (127.9 kg)   SpO2 100%   BMI 38.25 kg/m  Nursing note and vital signs reviewed.   Physical Exam Constitutional:      General: He is not in acute distress.    Appearance: He is well-developed.  Eyes:     Conjunctiva/sclera: Conjunctivae normal.  Cardiovascular:     Rate and Rhythm: Normal rate and regular rhythm.     Heart sounds: Normal heart sounds. No murmur heard.    No friction rub. No gallop.  Pulmonary:     Effort: Pulmonary effort is normal. No respiratory distress.     Breath sounds: Normal breath sounds. No wheezing or rales.  Chest:     Chest wall: No tenderness.  Abdominal:     General: Bowel sounds are normal.     Palpations: Abdomen is soft.     Tenderness: There is no abdominal tenderness.  Musculoskeletal:     Cervical back: Neck supple.  Lymphadenopathy:     Cervical: No cervical adenopathy.  Skin:    General: Skin is warm and dry.     Findings: No rash.   Neurological:     Mental Status: He is alert and oriented to person, place, and time.          04/19/2024    8:31 AM 11/19/2023    8:58 AM 04/21/2023    8:39 AM 10/17/2022    8:28 AM 04/25/2022    9:44 AM  Depression screen PHQ 2/9  Decreased Interest 0 0 0 0 0  Down, Depressed, Hopeless 0 1 0 0 0  PHQ - 2 Score 0 1 0 0 0  Altered sleeping  0     Tired, decreased energy  0  Change in appetite  0     Feeling bad or failure about yourself   0     Trouble concentrating  0     Moving slowly or fidgety/restless  0     Suicidal thoughts  0     PHQ-9 Score  1      Difficult doing work/chores  Somewhat difficult        Data saved with a previous flowsheet row definition        11/19/2023    8:59 AM  GAD 7 : Generalized Anxiety Score  Nervous, Anxious, on Edge 0  Control/stop worrying 3  Worry too much - different things 3  Trouble relaxing 0  Restless 0  Easily annoyed or irritable 0  Afraid - awful might happen 0  Total GAD 7 Score 6  Anxiety Difficulty Somewhat difficult     The ASCVD Risk score (Arnett DK, et al., 2019) failed to calculate for the following reasons:   The valid total cholesterol range is 130 to 320 mg/dL      Assessment & Plan:    Patient Active Problem List   Diagnosis Date Noted   Healthcare maintenance 04/19/2024   Hallux valgus 03/22/2024   Stasis dermatitis 03/22/2024   Stye 11/19/2023   Weight loss 10/14/2023   At high risk for cardiovascular disease 10/17/2022   Symptomatic bradycardia 10/17/2022   Subdural hematoma (HCC) 11/12/2021   Asthma    GERD (gastroesophageal reflux disease)    SDH (subdural hematoma) (HCC) 10/24/2021   Multiple lipomas 03/10/2021   Wellness examination 10/13/2020   Numbness 09/28/2020   Subcutaneous nodules 03/01/2019   Hypertension 10/19/2018   Sleep disorder 03/05/2018   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) 08/19/2016   Morbid obesity (HCC) 08/19/2016    Solitary pulmonary nodule 05/06/2016   Hidradenitis suppurativa 04/27/2016   Chest pain 04/26/2016   Avascular necrosis of bone of hip, left s/p TRH (HCC) 03/19/2016   HIV (human immunodeficiency virus infection) (HCC) 03/18/2016   DVT (deep venous thrombosis) (HCC) 03/18/2016   Chronic kidney disease, stage 3a (HCC) 03/18/2016   Cholelithiasis 03/18/2016   Diverticulosis of colon without hemorrhage 03/18/2016     Problem List Items Addressed This Visit       Other   HIV (human immunodeficiency virus infection) (HCC) - Primary   Allen Espinoza continues to have well-controlled virus with good adherence and tolerance to Cabenuva .  Reviewed previous lab work and discussed plan of care and U equals U.  Remains covered by [employer] with no problems obtaining medication.  Social determinants of health reviewed with no interventions indicated.  Check blood work.  Continue current dose of Cabenuva  administered without complication. Plan for follow up in 6 months or sooner if needed with lab work on the same day and with pharmacy provider in between.       Relevant Medications   rosuvastatin  (CRESTOR ) 10 MG tablet   Other Relevant Orders   Comprehensive metabolic panel with GFR   HIV-1 RNA quant-no reflex-bld   T-helper cell (CD4)- ([facility] clinic only)   At high risk for cardiovascular disease   Cholesterol adequately controlled with current dose of rosuvastatin  with no adverse side effects. Discussed benefits of decreased cardiovascular disease and HIV associated inflammation. Continue current dose or rosuvastain.       Relevant Medications   rosuvastatin  (CRESTOR ) 10 MG tablet   Healthcare maintenance   Discussed importance of safe sexual practice  and condom use. Condoms and site specific STD testing offered.  Vaccinations reviewed and following counseling Covid declined.  Routine dental care up to date.  Colon cancer screening up to date.        I have changed Allen Espinoza's  rosuvastatin . I am also having him maintain his Xarelto , triamcinolone  ointment, cabotegravir  & rilpivirine  ER, Humira  (2 Pen), acetaminophen , latanoprost, multivitamin with minerals, metroNIDAZOLE , and Bimzelx. We administered cabotegravir  & rilpivirine  ER.   Meds ordered this encounter  Medications   cabotegravir  & rilpivirine  ER (CABENUVA ) 600 & 900 MG/3ML injection 1 kit   rosuvastatin  (CRESTOR ) 10 MG tablet    Sig: Take 1 tablet (10 mg total) by mouth daily.    Dispense:  90 tablet    Refill:  1    Supervising Provider:   LUIZ CHANNEL [[id number]]     Follow-up: Return in about 6 months (around 10/17/2024). or sooner if needed.    Allen July, MSN, FNP-C Nurse Practitioner [facility] for Infectious Disease [facility] Main number: [phone]      [1]  Allergies Allergen Reactions   Sulfa Antibiotics Other (See Comments)    Hypotension    Firvanq  [Vancomycin ] Itching    OK to take with Benadryl    "

## 2024-04-19 NOTE — Patient Instructions (Addendum)
 Nice to see you.  We will check your lab work today.  Continue to take your medication daily as prescribed.  Refills have been sent to the pharmacy.  Plan for follow up in 6 months or sooner if needed with lab work on the same day and with pharmacy provider in between.   Have a great day and stay safe!

## 2024-04-19 NOTE — Assessment & Plan Note (Signed)
 Cholesterol adequately controlled with current dose of rosuvastatin  with no adverse side effects. Discussed benefits of decreased cardiovascular disease and HIV associated inflammation. Continue current dose or rosuvastain.

## 2024-04-19 NOTE — Assessment & Plan Note (Signed)
 Allen Espinoza continues to have well-controlled virus with good adherence and tolerance to Cabenuva .  Reviewed previous lab work and discussed plan of care and U equals U.  Remains covered by [employer] with no problems obtaining medication.  Social determinants of health reviewed with no interventions indicated.  Check blood work.  Continue current dose of Cabenuva  administered without complication. Plan for follow up in 6 months or sooner if needed with lab work on the same day and with pharmacy provider in between.

## 2024-04-20 ENCOUNTER — Telehealth: Payer: Self-pay

## 2024-04-20 LAB — T-HELPER CELL (CD4) - (RCID CLINIC ONLY)
CD4 % Helper T Cell: 30 % — ABNORMAL LOW (ref 33–65)
CD4 T Cell Abs: 505 /uL (ref 400–1790)

## 2024-04-20 NOTE — Telephone Encounter (Signed)
 Patient called requesting to review lab results. Notified him that viral load is not back yet. Relayed CD4 count healthy at over 500, kidney and liver function and electrolytes WNL. Discussed mildly elevated glucose but that this is to be expected if he was not fasting before labs were drawn.   He has concerns about total protein and globulin. Notified him that once all results are back his provider will review and advise if there is anything of concern.   Annaleia Pence, BSN, RN

## 2024-04-21 ENCOUNTER — Ambulatory Visit: Payer: Self-pay | Admitting: Family

## 2024-04-21 LAB — COMPREHENSIVE METABOLIC PANEL WITH GFR
AG Ratio: 0.8 (calc) — ABNORMAL LOW (ref 1.0–2.5)
ALT: 10 U/L (ref 9–46)
AST: 14 U/L (ref 10–35)
Albumin: 3.8 g/dL (ref 3.6–5.1)
Alkaline phosphatase (APISO): 98 U/L (ref 35–144)
BUN: 13 mg/dL (ref 7–25)
CO2: 29 mmol/L (ref 20–32)
Calcium: 8.9 mg/dL (ref 8.6–10.3)
Chloride: 108 mmol/L (ref 98–110)
Creat: 1.23 mg/dL (ref 0.70–1.35)
Globulin: 4.5 g/dL — ABNORMAL HIGH (ref 1.9–3.7)
Glucose, Bld: 109 mg/dL — ABNORMAL HIGH (ref 65–99)
Potassium: 4.3 mmol/L (ref 3.5–5.3)
Sodium: 142 mmol/L (ref 135–146)
Total Bilirubin: 0.5 mg/dL (ref 0.2–1.2)
Total Protein: 8.3 g/dL — ABNORMAL HIGH (ref 6.1–8.1)
eGFR: 65 mL/min/1.73m2

## 2024-04-21 LAB — HIV-1 RNA QUANT-NO REFLEX-BLD
HIV 1 RNA Quant: 25 {copies}/mL — ABNORMAL HIGH
HIV-1 RNA Quant, Log: 1.4 {Log_copies}/mL — ABNORMAL HIGH

## 2024-04-22 DIAGNOSIS — L732 Hidradenitis suppurativa: Principal | ICD-10-CM

## 2024-04-22 MED FILL — HUMIRA(CF) PEN 80 MG/0.8 ML SUBCUTANEOUS KIT: 28 days supply | Qty: 2 | Fill #1

## 2024-04-22 NOTE — Progress Notes (Signed)
 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     [facility] Clinical Assessment & Refill Coordination Note    [place], [place]: 1960-08-25  Phone: There are no phone numbers on file.    All above HIPAA information was verified with patient.     Was a nurse, learning disability used for this call? No    Specialty Medication(s):   Inflammatory Disorders: Humira      Current Medications[1]     Changes to medications: Fermon reports no changes at this time.    Medication list has been reviewed and updated in Epic: Yes    Allergies[2]    Changes to allergies: No    Allergies have been reviewed and updated in Epic: Yes    SPECIALTY MEDICATION ADHERENCE     Humira  80 mg: 0 doses of medicine on hand      Specialty medication is an injection or given on a cycle: Yes, Next injection is scheduled for 1/27.    Medication Adherence    Patient reported X missed doses in the last month: 0  Specialty Medication: Humira   Support network for adherence: family member          Specialty medication(s) dose(s) confirmed: Regimen is correct and unchanged.     Are there any concerns with adherence? No    Adherence counseling provided? Not needed    CLINICAL MANAGEMENT AND INTERVENTION      Clinical Benefit Assessment:    Do you feel the medicine is effective or helping your condition? Yes    Clinical Benefit counseling provided? Not needed    Adverse Effects Assessment:    Are you experiencing any side effects? No    Are you experiencing difficulty administering your medicine? No    Quality of Life Assessment:    Quality of Life    Rheumatology  Oncology  Dermatology  1. What impact has your specialty medication had on the symptoms of your skin condition (i.e. itchiness, soreness, stinging)?: Some  2. What impact has your specialty medication had on your comfort level with your skin?: Some  Cystic Fibrosis          How many days over the past month did your HS  keep you from your normal activities? For example, brushing your teeth or getting up in the morning. Patient declined to answer    Have you discussed this with your provider? Not needed    Acute Infection Status:    Acute infections noted within Epic:  No active infections    Patient reported infection: None    Therapy Appropriateness:    Is the medication and dose appropriate considering the patient???s diagnosis, treatment, and disease journey, comorbidities, medical history, current medications, allergies, therapeutic goals, self-administration ability, and access barriers? Yes, therapy is appropriate and should be continued     Clinical Intervention:    Was an intervention completed as part of this clinical assessment? No    DISEASE/MEDICATION-SPECIFIC INFORMATION      N/A    Chronic Inflammatory Diseases: Have you experienced any flares in the last month? No  Has this been reported to your provider? No    PATIENT SPECIFIC NEEDS     Does the patient have any physical, cognitive, or cultural barriers? No    Is the patient high risk? No    Does the patient require physician intervention or other additional services (i.e., nutrition, smoking cessation, social work)? No    Does the patient  have an additional or emergency contact listed in their chart? Yes    SOCIAL DETERMINANTS OF HEALTH     At the [facility], we have learned that life circumstances - like trouble affording food, housing, utilities, or transportation can affect the health of many of our patients.   That is why we wanted to ask: are you currently experiencing any life circumstances that are negatively impacting your health and/or quality of life? Patient declined to answer    Social Drivers of Health     Food Insecurity: No Food Insecurity (09/12/2023)    Received from [facility]    Hunger Vital Sign     Within the past 12 months, you worried that your food would run out before you got the money to buy more.: Never true     Within the past 12 months, the food you bought just didn't last and you didn't have money to get more.: Never true   Tobacco Use: Low Risk (12/29/2023)    Patient History     Smoking Tobacco Use: Never     Smokeless Tobacco Use: Never     Passive Exposure: Not on file   Transportation Needs: No Transportation Needs (09/12/2023)    Received from [facility] - Transportation     In the past 12 months, has lack of transportation kept you from medical appointments or from getting medications?: No     In the past 12 months, has lack of transportation kept you from meetings, work, or from getting things needed for daily living?: No   Alcohol Use: Not on file   Housing: Not on file   Physical Activity: Not on file   Utilities: Not At Risk (09/12/2023)    Received from [facility] Utilities     In the past 12 months has the electric, gas, oil, or water company threatened to shut off services in your home?: No   Stress: Not on file   Interpersonal Safety: Not At Risk (12/29/2023)    Interpersonal Safety     Unsafe Where You Currently Live: No     Physically Hurt by Anyone: No     Abused by Anyone: No   Substance Use: Not on file (02/04/2023)   Intimate Partner Violence: Not At Risk (09/12/2023)    Received from [facility]    Humiliation, Afraid, Rape, and Kick questionnaire     Within the last year, have you been afraid of your partner or ex-partner?: No     Within the last year, have you been humiliated or emotionally abused in other ways by your partner or ex-partner?: No     Within the last year, have you been kicked, hit, slapped, or otherwise physically hurt by your partner or ex-partner?: No     Within the last year, have you been raped or forced to have any kind of sexual activity by your partner or ex-partner?: No   Social Connections: Not on file   Financial Resource Strain: Medium Risk (02/18/2022)    Received from [facility]    Overall Financial Resource Strain (CARDIA)     Difficulty of Paying Living Expenses: Somewhat hard   Health Literacy: Not on file Internet Connectivity: Not on file       Would you be willing to receive help with any of the needs that you have identified today? Not applicable       SHIPPING     Specialty Medication(s)  to be Shipped:   Inflammatory Disorders: Humira     Other medication(s) to be shipped: No additional medications requested for fill at this time    Specialty Medications not needed at this time: N/A     Changes to insurance: No    Cost and Payment: Patient has a $0 copay, payment information is not required.    Delivery Scheduled: Yes, Expected medication delivery date: 1/23.     Medication will be delivered via UPS to the confirmed prescription address in [facility].    The patient will receive a drug information handout for each medication shipped and additional FDA Medication Guides as required.  Verified that patient has previously received a Conservation Officer, Historic Buildings and a Surveyor, Mining.    The patient or caregiver noted above participated in the development of this care plan and knows that they can request review of or adjustments to the care plan at any time.      All of the patient's questions and concerns have been addressed.    Alois [facility] Specialty Pharmacist         [1]   Current Outpatient Medications   Medication Sig Dispense Refill    acetaminophen  (TYLENOL ) 500 MG tablet Take 2 tablets (1,000 mg total) by mouth.      adalimumab  (HUMIRA ,CF, PEN) 80 mg/0.8 mL PnKt Inject the contents of 1 pen (80 mg) under the skin every fourteen (14) days for maintenance dose 2 each 11    bictegrav-emtricit-tenofov ala (BIKTARVY) 50-200-25 mg tablet Take 1 tablet by mouth daily. (Patient not taking: Reported on 12/29/2023)      cabotegravir-rilpivirine (CABENUVA) 600 mg-900 mg/3 mL extended-release injection Inject 1 kit into the muscle.      empty container Misc Use as directed to dispose of Bimzelx  pens. 1 each 2    losartan  (COZAAR ) 50 MG tablet       methocarbamol (ROBAXIN) 750 MG tablet Take 1 tablet (750 mg total) by mouth.      metroNIDAZOLE  (FLAGYL ) 250 MG tablet TAKE 1 TABLET BY MOUTH THREE TIMES DAILY 90 tablet 10    pantoprazole (PROTONIX) 40 MG tablet  (Patient not taking: Reported on 12/29/2023)      rivaroxaban (XARELTO) 20 mg tablet Take 20 mg by mouth daily with evening meal.      rosuvastatin (CRESTOR) 10 MG tablet Take 1 tablet (10 mg total) by mouth daily.      triamcinolone  (KENALOG ) 0.1 % ointment APPLY TOPICALLY TWICE DAILY TO AFFECTED AREAS AS NEEDED FOR DERMATITIS IN THE GLUTEAL CLEFT 454 g 11     No current facility-administered medications for this visit.   [2]   Allergies  Allergen Reactions    Sulfasalazine      Other reaction(s): Other (See Comments)  Low blood pressure    Vancomycin Itching     Give with benadryl

## 2024-05-01 ENCOUNTER — Telehealth (HOSPITAL_BASED_OUTPATIENT_CLINIC_OR_DEPARTMENT_OTHER): Payer: Self-pay | Admitting: Family Medicine

## 2024-05-01 NOTE — Telephone Encounter (Signed)
 Copied from CRM #[id number]. Topic: Clinical - Medication Refill >> Apr 30, 2024  1:06 PM Joesph B wrote: Medication:  XARELTO  20 MG TABS tablet    Has the patient contacted their pharmacy? Yes (Agent: If no, request that the patient contact the pharmacy for the refill. If patient does not wish to contact the pharmacy document the reason why and proceed with request.) (Agent: If yes, when and what did the pharmacy advise?)  This is the patient's preferred pharmacy:   [facility], [place] - [address] [place] [address] Phone: [phone] Fax: [phone]  Is this the correct pharmacy for this prescription? Yes If no, delete pharmacy and type the correct one.   Has the prescription been filled recently? Yes  Is the patient out of the medication? Yes  Has the patient been seen for an appointment in the last year OR does the patient have an upcoming appointment? Yes  Can we respond through MyChart? Yes  Agent: Please be advised that Rx refills may take up to 3 business days. We ask that you follow-up with your pharmacy.

## 2024-05-03 NOTE — Telephone Encounter (Signed)
 Dr. De [place], please advise if you are okay taking over refills of pt's xarelto  or if you want cardiology to manage med?

## 2024-06-11 ENCOUNTER — Encounter: Payer: Self-pay | Admitting: Pharmacist

## 2024-06-21 ENCOUNTER — Ambulatory Visit (HOSPITAL_BASED_OUTPATIENT_CLINIC_OR_DEPARTMENT_OTHER): Admitting: Family Medicine
